# Patient Record
Sex: Female | Born: 1960
Health system: Southern US, Community
[De-identification: ages and names within clinical notes are randomized; demographics above are authoritative.]

## PROBLEM LIST (undated history)

## (undated) DIAGNOSIS — Z923 Personal history of irradiation: Secondary | ICD-10-CM

## (undated) DIAGNOSIS — C50412 Malignant neoplasm of upper-outer quadrant of left female breast: Secondary | ICD-10-CM

## (undated) DIAGNOSIS — Z800 Family history of malignant neoplasm of digestive organs: Secondary | ICD-10-CM

## (undated) DIAGNOSIS — Z9221 Personal history of antineoplastic chemotherapy: Secondary | ICD-10-CM

## (undated) DIAGNOSIS — K219 Gastro-esophageal reflux disease without esophagitis: Secondary | ICD-10-CM

## (undated) DIAGNOSIS — G360 Neuromyelitis optica [Devic]: Secondary | ICD-10-CM

## (undated) DIAGNOSIS — Z803 Family history of malignant neoplasm of breast: Secondary | ICD-10-CM

## (undated) DIAGNOSIS — F419 Anxiety disorder, unspecified: Secondary | ICD-10-CM

## (undated) DIAGNOSIS — C50919 Malignant neoplasm of unspecified site of unspecified female breast: Secondary | ICD-10-CM

## (undated) HISTORY — DX: Neuromyelitis optica (devic): G36.0

## (undated) HISTORY — DX: Anxiety disorder, unspecified: F41.9

## (undated) HISTORY — PX: BREAST LUMPECTOMY: SHX2

## (undated) HISTORY — PX: EYE SURGERY: SHX253

## (undated) HISTORY — DX: Malignant neoplasm of upper-outer quadrant of left female breast: C50.412

## (undated) HISTORY — DX: Family history of malignant neoplasm of digestive organs: Z80.0

## (undated) HISTORY — DX: Family history of malignant neoplasm of breast: Z80.3

## (undated) HISTORY — PX: ABDOMINAL HYSTERECTOMY: SHX81

## (undated) HISTORY — DX: Gastro-esophageal reflux disease without esophagitis: K21.9

## (undated) HISTORY — PX: REDUCTION MAMMAPLASTY: SUR839

---

## 1999-06-07 ENCOUNTER — Other Ambulatory Visit: Admission: RE | Admit: 1999-06-07 | Discharge: 1999-06-07 | Payer: Self-pay | Admitting: Obstetrics & Gynecology

## 2000-09-10 ENCOUNTER — Other Ambulatory Visit: Admission: RE | Admit: 2000-09-10 | Discharge: 2000-09-10 | Payer: Self-pay | Admitting: Obstetrics & Gynecology

## 2001-10-15 ENCOUNTER — Other Ambulatory Visit: Admission: RE | Admit: 2001-10-15 | Discharge: 2001-10-15 | Payer: Self-pay | Admitting: Obstetrics & Gynecology

## 2004-08-07 ENCOUNTER — Other Ambulatory Visit: Admission: RE | Admit: 2004-08-07 | Discharge: 2004-08-07 | Payer: Self-pay | Admitting: Obstetrics & Gynecology

## 2007-04-08 ENCOUNTER — Ambulatory Visit (HOSPITAL_COMMUNITY): Admission: RE | Admit: 2007-04-08 | Discharge: 2007-04-09 | Payer: Self-pay | Admitting: Obstetrics & Gynecology

## 2007-04-08 ENCOUNTER — Encounter (INDEPENDENT_AMBULATORY_CARE_PROVIDER_SITE_OTHER): Payer: Self-pay | Admitting: Obstetrics & Gynecology

## 2010-05-27 ENCOUNTER — Other Ambulatory Visit: Payer: Self-pay | Admitting: Obstetrics & Gynecology

## 2010-05-27 DIAGNOSIS — R928 Other abnormal and inconclusive findings on diagnostic imaging of breast: Secondary | ICD-10-CM

## 2010-05-31 ENCOUNTER — Ambulatory Visit
Admission: RE | Admit: 2010-05-31 | Discharge: 2010-05-31 | Disposition: A | Discharge status: Home / Self Care | Payer: Self-pay | Source: Ambulatory Visit | Attending: Obstetrics & Gynecology | Admitting: Obstetrics & Gynecology

## 2010-05-31 DIAGNOSIS — R928 Other abnormal and inconclusive findings on diagnostic imaging of breast: Secondary | ICD-10-CM

## 2010-07-16 NOTE — Op Note (Signed)
NAMEKYMANI, LAURSEN               ACCOUNT NO.:  [id number]   MEDICAL RECORD NO.:  [id number]          PATIENT TYPE:  OIB   LOCATION:  9302                          FACILITY:  [facility]   PHYSICIAN:  Freddy Finner, M.D.   DATE OF BIRTH:  1960/10/27   DATE OF PROCEDURE:  04/08/2007  DATE OF DISCHARGE:                               OPERATIVE REPORT   PREOPERATIVE DIAGNOSIS:  Fibroids, menorrhagia, severe dysmenorrhea.   POSTOPERATIVE DIAGNOSIS:  Fibroids, menorrhagia, severe dysmenorrhea.   OPERATIVE PROCEDURE:  Laparoscopically assisted vaginal hysterectomy.   SURGEON:  Freddy Finner, M.D.   ASSISTANT:  Guy Sandifer. Henderson Cloud, M.D.   ESTIMATED BLOOD LOSS:  300 to 350 mL.   COMPLICATIONS:  None.   INDICATIONS FOR PROCEDURE:  The patient is a 50 year old whose present  illness is detailed in the admission note, but briefly, she had  significant enlargement of her uterus with an approximately 3 cm  intracavitary submucous myoma as well as other fibromas of the uterus.  She had clinical symptoms of menorrhagia and severe dysmenorrhea, and  she is admitted now for laparoscopically assisted vaginal hysterectomy.  She has expressed the desire to keep her ovaries and for that reason,  they will be removed only if abnormal.   OPERATIVE FINDINGS:  Intraoperative findings were recorded and still  photographs obtained in the office records.  These included marked  enlargement of the uterus which essentially filled the patient with  submucous and subserosal and intramural myomas discovered during the  procedure.  There was a follicular type cyst on the right ovary.  The  left ovary was normal.  Both fallopian tubes were normal.  There was no  evidence of peritoneal disease.  The appendix was normal.  No apparent  abnormalities were noted in the upper abdomen.   DESCRIPTION OF PROCEDURE:  The patient was admitted on the morning of  surgery.  She was given a bolus of Ancef.  She was placed in PIS  serial  compression hose.  She was brought to the operating room and, there,  placed under adequate general anesthesia, and placed in the dorsal  lithotomy position.  A Betadine prep of the abdomen, perineum, and  vagina was carried out in the usual fashion.  The bladder was evacuated  with a sterile Foley catheter.  The cervix was visualized and a Hulka  tenaculum attached using a bivalve speculum.  Sterile drapes were  applied.  Two small incisions were made in the abdomen, one at the  umbilicus and one just above the symphysis.  An 11 mm bladed disposable  trocar was introduced in the umbilicus while elevating the anterior  abdominal wall manually.  Direct inspection revealed adequate placement  with no evidence of injury on entry.  Pneumoperitoneum was allowed to  accumulate with carbon dioxide gas.  A smaller second incision was made  just above the symphysis in the midline and through it a 5 mm bladed  trocar introduced under direct visualization.  Through it, a blunt probe  was used during the operative procedure.  Systematic examination of the  abdominal and  pelvic contents was carried out, photographs were made.  The patient was noted to have a prominent large sigmoid colon.  This was  not, in any other way, abnormal in appearance and there were no  pericecal adhesions.  The appendix was retrocecal but was visualized and  it appeared to be normal.   Using the Gyrus tripolar device through the operating channel of the  laparoscope and a blunt probe for traction as well as the Hulka  tenaculum for adequate exposure, progressive pedicles were developed  including the utero-ovarian pedicle, the fallopian tube, the round  ligament, and the upper broad ligament.  The dissection was carried down  to a level just above the uterine arteries.  Attention was ten turned  vaginally.  A posterior weighted vaginal retractor was placed.  The  Hulka tenaculum was removed.  Deaver retractors were  used laterally and  anteriorly for exposure.  The cervix was grasped with a Christella Hartigan  tenaculum.  A colpotomy incision was made while tenting the mucosa  posterior to the cervix.  The banana lighted retractor was then placed.  The cervix was released from the bladder anteriorly with a scalpel.  The  LigaSure device was then used to seal and divided the uterosacral  pedicles and the bladder pelvis.  The bladder was advanced off the  cervix.  Cardinal ligament pedicles were taken, sealed, and divided with  the LigaSure device.  The anterior peritoneum was entered.  The LigaSure  device was then used to seal and divide the uterine artery pedicles.  This allowed delivery of the uterus through the vaginal introitus after  a Kohring procedure to reduce uterine volume.  This allowed  identification of the submucous fibroid.  The angles of the vagina were  then anchored to the uterosacral ligaments on each side with a mattress  suture of 0 Monocryl.  Some bleeding was encountered along the right  side at the level of the uterine artery and cardinal ligament.  This was  sealed successfully with the LigaSure device.  This produced adequate  hemostasis.  The uterosacrals were then plicated with a 0 Monocryl  suture and posterior peritoneum in the cul-de-sac closed.  The cuff was  closed with figure-of-eight 0 Monocryl in a vertical fashion.  A Foley  catheter was placed.  Clear urine was obtained.  Reinspection was then  carried out laparoscopically.  The Nezhat irrigation system was used and  complete hemostasis was confirmed under careful irrigation and reduced  intra-abdominal pressure.  Irrigating solution was aspirated from the  abdomen, the gas was allowed to escape, the instruments were removed.  The skin incisions were closed with interrupted subcuticular sutures of  3-0 Dexon.  0.25% plain Marcaine was injected into the incision sites  for postoperative analgesia.  Steri-Strips were applied to  the lower  incision and a sterile dressing to the umbilicus.  The patient was  awakened and taken to the recovery room in good condition.      Freddy Finner, M.D.  Electronically Signed     WRN/MEDQ  D:  04/08/2007  T:  04/08/2007  Job:  [id number]

## 2010-07-16 NOTE — Discharge Summary (Signed)
Grace Moyer, Grace Moyer               ACCOUNT NO.:  [id number]   MEDICAL RECORD NO.:  [id number]          PATIENT TYPE:  OIB   LOCATION:  9302                          FACILITY:  [facility]   PHYSICIAN:  Freddy Finner, M.D.   DATE OF BIRTH:  30-Jan-1961   DATE OF ADMISSION:  04/08/2007  DATE OF DISCHARGE:  04/09/2007                               DISCHARGE SUMMARY   DISCHARGE DIAGNOSIS:  Multiple uterine leiomyomata with clinical  symptoms of menorrhagia, severe dysmenorrhea, ultrasound documented a 3-  cm submucous myoma in addition to numerous others with the largest being  5-cm.   OPERATIVE PROCEDURE:  Laparoscopically-assisted vaginal hysterectomy.   SECONDARY DIAGNOSIS:  Follicular appearing right ovarian cyst, benign in  appearance and untreated.   INTRAOPERATIVE/POSTOPERATIVE COMPLICATIONS:  None.   DISPOSITION:  The patient is in satisfactory and improved condition at  the time of her discharge.  She is having adequate bowel and bladder  function.  She is tolerating a regular diet.  She was discharged home  for followup in the office in 2 weeks.  She is to call for symptoms of  overflow urination with bladder retention.  She is to call for fever,  severe pain, or heavy vaginal bleeding.  She is given Percocet 5/325 to  be taken 1-2 every four hours as needed for postoperative pain.  She is  to take a vitamin with iron.  She is progressively increase her physical  activity but no vaginal entry or heavy lifting.   Details of present illness, past history, family history, review of  systems, and physical exam recorded in the admission note.   PHYSICAL FINDINGS:  Of note was the marked enlargement of the uterus.   LABORATORY DATA:  During this admission included a normal urinalysis, a  normal prothrombin time and PTT.  CBC showed a hemoglobin of 10.6 on  admission.  Other parameters were essentially normal.  Postoperative  hemoglobin was 8.7 consistent with estimated blood loss of  300 mL  intraoperatively.   HOSPITAL COURSE:  The patient was admitted on the morning of surgery.  She was treated perioperatively with IV antibiotic with compression hose  for her lower extremities.  The above surgical  procedure was  accomplished without significant difficulty or intraoperative  complications.  By the evening of the first postoperative day she was  voiding 600 mL with a residual of 118 mL.  She had bladder spasm with a  catheter which was removed immediately after surgery.  It was felt that  her voiding was adequate and she was discharged home with disposition as  noted above.  She remained afebrile throughout the hospital stay.     Freddy Finner, M.D.  Electronically Signed    WRN/MEDQ  D:  04/09/2007  T:  04/11/2007  Job:  [id number]

## 2010-07-16 NOTE — H&P (Signed)
Grace Moyer, Grace Moyer               ACCOUNT NO.:  [id number]   MEDICAL RECORD NO.:  [id number]          PATIENT TYPE:  AMB   LOCATION:  SDC                           FACILITY:  [facility]   PHYSICIAN:  Freddy Finner, M.D.   DATE OF BIRTH:  [birth year]   DATE OF ADMISSION:  04/08/2007  DATE OF DISCHARGE:                              HISTORY & PHYSICAL   ADMISSION DIAGNOSIS:  Uterine leiomyomata, multiple, with large 3 cm x  2.6 cm intracavitary submucous myoma.   CLINICAL HISTORY:  Severe menorrhagia, dysmenorrhea.   Patient is a 50 year old married white female, gravida 1, para 1, who  has been followed in my office since 1989.  In the recent past, she has  complained of progressively increasing menorrhagia, severe dysmenorrhea.  Her examination is a bit compromised by body habitus, but it was felt  the uterus was probably enlarged.  She had a sonohystogram in the office  on February 18, 2007 for evaluation of her symptoms and was found to  have multiple leiomyomata, the largest measuring 5.7 cm.  She also had  one lesion outlined by the saline infusion, which was 3 x 2.6 cm in the  uterine cavity itself.  Options of therapy were discussed with her,  including possible myomectomy, possible endometrial ablation, and  submucous myomectomy.  She requested definitive surgery since she is  very certain she does not want to have further pregnancies at her age of  40.  She has expressed a wish to keep her ovaries if at all possible,  and they will be removed only if abnormality is identified.  She is now  admitted for laparoscopic-assisted vaginal hysterectomy.  The potential  risks of the surgery have been reviewed with the patient, including  booklet and/or video, describing the potential risks, including injury  to other organs, postoperative infection, intraoperative hemorrhage.  She is prepared to proceed with surgery.   Her current review of systems is otherwise negative.  There are no  cardiopulmonary, GI or GU complaints.   Patient has no known significant ongoing medical illnesses.  She is not  on any medications on a chronic basis.   She smokes approximately one pack of cigarettes per week.  She has never  had a blood transfusion.   She has no known allergies to medications.   She does not use alcohol.   FAMILY HISTORY:  Noncontributory.   PHYSICAL EXAMINATION:  HEENT:  Grossly within normal limits.  Thyroid gland is not palpably enlarged.  HEART:  Normal sinus rhythm without murmurs, rubs or gallops.  CHEST:  Clear to auscultation.  BREASTS:  Normal.  No palpable masses.  No skin changes or nipple  discharge.  ABDOMEN:  Soft and nontender without appreciable organomegaly or  palpable masses.  EXTREMITIES:  Without clubbing, cyanosis or edema.  PELVIC:  Findings are remarkable, as noted above.   ASSESSMENT:  Multiple uterine leiomyomata, including large intracavitary  submucous myoma.   PLAN:  Laparoscopic-assisted vaginal hysterectomy, perioperative  antibiotics, and antiembolic hose.      Freddy Finner, M.D.  Electronically Signed  WRN/MEDQ  D:  04/07/2007  T:  04/07/2007  Job:  [id number]

## 2010-11-22 LAB — URINALYSIS, DIPSTICK ONLY
Bilirubin Urine: NEGATIVE
Glucose, UA: NEGATIVE
Hgb urine dipstick: NEGATIVE
Ketones, ur: NEGATIVE
Leukocytes, UA: NEGATIVE
Nitrite: NEGATIVE
Protein, ur: NEGATIVE
Specific Gravity, Urine: >1.03 — ABNORMAL HIGH
Urobilinogen, UA: 0.2
pH: 5.5

## 2010-11-22 LAB — CBC
HCT: 26.1 — ABNORMAL LOW
HCT: 31.3 — ABNORMAL LOW
Hemoglobin: 10.6 — ABNORMAL LOW
Hemoglobin: 8.7 — ABNORMAL LOW
MCHC: 33.3
MCHC: 33.8
MCV: 73.5 — ABNORMAL LOW
MCV: 73.7 — ABNORMAL LOW
Platelets: 238
Platelets: 298
RBC: 3.55 — ABNORMAL LOW
RBC: 4.26
RDW: 14.9
RDW: 15.1
WBC: 6.8
WBC: 8.1

## 2010-11-22 LAB — APTT: aPTT: 31

## 2010-11-22 LAB — PROTIME-INR
INR: 1
Prothrombin Time: 13.1

## 2014-08-14 ENCOUNTER — Other Ambulatory Visit: Payer: Self-pay | Admitting: Obstetrics & Gynecology

## 2014-08-15 LAB — CYTOLOGY - PAP

## 2015-03-04 HISTORY — PX: BREAST BIOPSY: SHX20

## 2015-08-02 ENCOUNTER — Ambulatory Visit (HOSPITAL_COMMUNITY)
Admission: RE | Admit: 2015-08-02 | Discharge: 2015-08-02 | Disposition: A | Discharge status: Home / Self Care | Payer: 59 | Source: Ambulatory Visit | Attending: Physician Assistant | Admitting: Physician Assistant

## 2015-08-02 ENCOUNTER — Other Ambulatory Visit (HOSPITAL_COMMUNITY): Payer: Self-pay | Admitting: Physician Assistant

## 2015-08-02 DIAGNOSIS — R200 Anesthesia of skin: Secondary | ICD-10-CM

## 2015-08-14 ENCOUNTER — Inpatient Hospital Stay (HOSPITAL_COMMUNITY)
Admission: EM | Admit: 2015-08-14 | Discharge: 2015-08-17 | DRG: 092 | Disposition: A | Discharge status: Home / Self Care | Payer: 59 | Attending: Internal Medicine | Admitting: Internal Medicine

## 2015-08-14 ENCOUNTER — Encounter (HOSPITAL_COMMUNITY): Payer: Self-pay | Admitting: Emergency Medicine

## 2015-08-14 ENCOUNTER — Emergency Department (HOSPITAL_COMMUNITY): Payer: 59

## 2015-08-14 DIAGNOSIS — N390 Urinary tract infection, site not specified: Secondary | ICD-10-CM | POA: Diagnosis present

## 2015-08-14 DIAGNOSIS — M542 Cervicalgia: Secondary | ICD-10-CM | POA: Diagnosis present

## 2015-08-14 DIAGNOSIS — R930 Abnormal findings on diagnostic imaging of skull and head, not elsewhere classified: Secondary | ICD-10-CM | POA: Diagnosis present

## 2015-08-14 DIAGNOSIS — R200 Anesthesia of skin: Secondary | ICD-10-CM

## 2015-08-14 DIAGNOSIS — E663 Overweight: Secondary | ICD-10-CM | POA: Diagnosis present

## 2015-08-14 DIAGNOSIS — R208 Other disturbances of skin sensation: Principal | ICD-10-CM | POA: Diagnosis present

## 2015-08-14 DIAGNOSIS — Z87891 Personal history of nicotine dependence: Secondary | ICD-10-CM

## 2015-08-14 DIAGNOSIS — D860 Sarcoidosis of lung: Secondary | ICD-10-CM

## 2015-08-14 LAB — TSH: TSH: 2.396 u[IU]/mL (ref 0.350–4.500)

## 2015-08-14 LAB — URINE MICROSCOPIC-ADD ON: RBC / HPF: NONE SEEN RBC/hpf (ref 0–5)

## 2015-08-14 LAB — CBC
HCT: 43.9 % (ref 36.0–46.0)
Hemoglobin: 15.3 g/dL — ABNORMAL HIGH (ref 12.0–15.0)
MCH: 29.9 pg (ref 26.0–34.0)
MCHC: 34.9 g/dL (ref 30.0–36.0)
MCV: 85.7 fL (ref 78.0–100.0)
Platelets: 211 10*3/uL (ref 150–400)
RBC: 5.12 MIL/uL — ABNORMAL HIGH (ref 3.87–5.11)
RDW: 12.8 % (ref 11.5–15.5)
WBC: 7.9 10*3/uL (ref 4.0–10.5)

## 2015-08-14 LAB — COMPREHENSIVE METABOLIC PANEL
ALT: 13 U/L — ABNORMAL LOW (ref 14–54)
AST: 18 U/L (ref 15–41)
Albumin: 4.3 g/dL (ref 3.5–5.0)
Alkaline Phosphatase: 53 U/L (ref 38–126)
Anion gap: 11 (ref 5–15)
BUN: 13 mg/dL (ref 6–20)
CO2: 20 mmol/L — ABNORMAL LOW (ref 22–32)
Calcium: 8.9 mg/dL (ref 8.9–10.3)
Chloride: 106 mmol/L (ref 101–111)
Creatinine, Ser: 0.79 mg/dL (ref 0.44–1.00)
GFR calc Af Amer: >60 mL/min (ref >60)
GFR calc non Af Amer: >60 mL/min (ref >60)
Glucose, Bld: 96 mg/dL (ref 65–99)
Potassium: 3.5 mmol/L (ref 3.5–5.1)
Sodium: 137 mmol/L (ref 135–145)
Total Bilirubin: 1 mg/dL (ref 0.3–1.2)
Total Protein: 7.2 g/dL (ref 6.5–8.1)

## 2015-08-14 LAB — URINALYSIS, ROUTINE W REFLEX MICROSCOPIC
Glucose, UA: NEGATIVE mg/dL
Hgb urine dipstick: NEGATIVE
Ketones, ur: >80 mg/dL — AB
Leukocytes, UA: NEGATIVE
Nitrite: NEGATIVE
Specific Gravity, Urine: 1.015 (ref 1.005–1.030)
pH: 6.5 (ref 5.0–8.0)

## 2015-08-14 LAB — LIPASE, BLOOD: Lipase: 14 U/L (ref 11–51)

## 2015-08-14 LAB — GLUCOSE, CAPILLARY: Glucose-Capillary: 107 mg/dL — ABNORMAL HIGH (ref 65–99)

## 2015-08-14 LAB — CSF CELL COUNT WITH DIFFERENTIAL
Eosinophils, CSF: 0 % (ref 0–1)
Lymphs, CSF: 97 % — ABNORMAL HIGH (ref 40–80)
Monocyte-Macrophage-Spinal Fluid: 3 % — ABNORMAL LOW (ref 15–45)
Other Cells, CSF: 0
RBC Count, CSF: 18 /mm3 — ABNORMAL HIGH
Segmented Neutrophils-CSF: 0 % (ref 0–6)
Tube #: 4
WBC, CSF: 38 /mm3 (ref 0–5)

## 2015-08-14 LAB — GLUCOSE, CSF: Glucose, CSF: 46 mg/dL (ref 40–70)

## 2015-08-14 LAB — PROTEIN, CSF: Total  Protein, CSF: 38 mg/dL (ref 15–45)

## 2015-08-14 LAB — SEDIMENTATION RATE: Sed Rate: 8 mm/hr (ref 0–22)

## 2015-08-14 MED ORDER — LIDOCAINE HCL (PF) 1 % IJ SOLN
5.0000 mL | Freq: Once | INTRAMUSCULAR | Status: AC
  Administered 2015-08-14: 5 mL via INTRADERMAL
  Filled 2015-08-14: qty 5

## 2015-08-14 MED ORDER — ONDANSETRON HCL 4 MG PO TABS: 4.0000 mg | ORAL_TABLET | Freq: Four times a day (QID) | ORAL | Status: DC | PRN

## 2015-08-14 MED ORDER — SODIUM CHLORIDE 0.9 % IV SOLN
INTRAVENOUS | Status: DC
  Administered 2015-08-14 – 2015-08-17 (×2): via INTRAVENOUS

## 2015-08-14 MED ORDER — ONDANSETRON HCL 4 MG/2ML IJ SOLN: 4.0000 mg | Freq: Four times a day (QID) | INTRAMUSCULAR | Status: DC | PRN

## 2015-08-14 MED ORDER — SODIUM CHLORIDE 0.9 % IV SOLN
250.0000 mg | Freq: Four times a day (QID) | INTRAVENOUS | Status: AC
  Administered 2015-08-14 – 2015-08-17 (×12): 250 mg via INTRAVENOUS
  Filled 2015-08-14 (×12): qty 2

## 2015-08-14 MED ORDER — ONDANSETRON HCL 4 MG/2ML IJ SOLN
4.0000 mg | Freq: Once | INTRAMUSCULAR | Status: AC
  Administered 2015-08-14: 4 mg via INTRAVENOUS
  Filled 2015-08-14: qty 2

## 2015-08-14 MED ORDER — GADOBENATE DIMEGLUMINE 529 MG/ML IV SOLN
20.0000 mL | Freq: Once | INTRAVENOUS | Status: AC | PRN
  Administered 2015-08-14: 20 mL via INTRAVENOUS

## 2015-08-14 MED ORDER — SODIUM CHLORIDE 0.9 % IV BOLUS (SEPSIS)
500.0000 mL | Freq: Once | INTRAVENOUS | Status: AC
  Administered 2015-08-14: 500 mL via INTRAVENOUS

## 2015-08-14 MED ORDER — PANTOPRAZOLE SODIUM 40 MG PO TBEC
40.0000 mg | DELAYED_RELEASE_TABLET | Freq: Every day | ORAL | Status: DC
  Administered 2015-08-14 – 2015-08-17 (×4): 40 mg via ORAL
  Filled 2015-08-14 (×4): qty 1

## 2015-08-14 NOTE — Consult Note (Signed)
[place] A. Merlene Laughter, MD     [email or web address]          Grace Moyer is an 55 y.o. female.   ASSESSMENT/PLAN: 1. Acute myelopathy with multiple enhancing lesions involving the central nervous system including the cervical spine and the right hemisphere. Differential diagnosis is extensive. The patient's age of presentation and the MRI findings seems not to point listed demyelination either multiple sclerosis or neuromyelitis optica/Devic's disease. The findings in my view are more concerning for central nervous system malignancy even a lymphoma or glioma. Certainly inflammatory processes such as sarcoidosis and vasculitis are also concerning.   2. UTI.      RECOMMENDATION: I agree with the current workup which has been undertaken including cytology of the spinal fluid.  The patient has been started on high-dose steroids which seems reasonable for symptomatic relief.  We should consider SPECT scan or PET scan to evaluate the hyperactivity of the lesions seen on MRI. This would help differentiate these lesions are inflammatory or malignant.  Additional labs will be done for the following: HIV, RPR, SSB/SSA for Sjogren's syndrome, ANA and vitamin B12 level. We will also follow-up the oligoclonal bands to evaluate for demyelination on spinal fluid.   Chest XRAY.      The patient is a 55 year old white female who decided see medical attention because of having significant nausea and vomiting over the last 3 days. However, the patient reports that over the past 3 weeks she's had numbness and tingling involving the lower extremities. Initially symptoms started in the feet but progressed more proximally to involve the sacral region. She also reports more recently developing lancinating numbness and pain when she bends her neck forward consistent with Lhermitte sign. The patient has not had neurological symptoms like this previously. There is no family history of  neurological symptoms. She reports having low-grade fever on Memorial Day. She denies chest pain, shortness of breath or visual symptoms. She does have some chronic blurry vision without her glasses but nothing outside of the usual for the patient. Urinary symptoms are not reported. The review systems otherwise negative.       GENERAL: This a very pleasant overweight female in no acute distress.  HEENT: Normal.  ABDOMEN: soft  EXTREMITIES: No edema   BACK: Normal.  SKIN: Normal by inspection.    MENTAL STATUS: Alert and oriented. Speech, language and cognition are generally intact. Judgment and insight normal.   CRANIAL NERVES: Pupils are equal, round and reactive to light and accomodation; extra ocular movements are full, there is no significant nystagmus; visual fields are full; upper and lower facial muscles are normal in strength and symmetric, there is no flattening of the nasolabial folds; tongue is midline; uvula is midline; shoulder elevation is normal.  MOTOR: Normal tone, bulk and strength; no pronator drift.  COORDINATION: Left finger to nose is normal, right finger to nose is normal, No rest tremor; no intention tremor; no postural tremor; no bradykinesia.  REFLEXES: Deep tendon reflexes are symmetrical and normal. Babinski reflexes are flexor bilaterally.   SENSATION: Normal to light touch and temperature.       The patient's cervical spine MRI and brain MRI are reviewed in person. There is a large signal seen with some expansion of the spinal cord at C2-C3 involving mostly the posterior aspect of the cord associated with ring enhancement with gadolinium. The abnormality does not extend to other segments and essentially involves this one segment. The brain MRI shows 2 small  enhancing lesions involving the right frontal lobe mostly the subcortical region. One involves the subcortical area next to the caudate nuclei and the frontal horn of the lateral ventricle. There  are 2 larger enhancing lesions involving the right medial temporal lobe and right temporal lobes. There is also increased signal/enhancement seen around the trigone also on the right side. Interestingly, FLAIR imaging does not show typical findings suggestive of demyelination. There is a large lesion involving the right frontal area which did not enhance. There is also increased signal seen around the trigone on the right side. This region did enhance.           Blood pressure 105/91, pulse 62, temperature 98.2 F (36.8 C), temperature source Oral, resp. rate 16, height 5\' 3"  (1.6 m), weight 202 lb (91.627 kg), SpO2 100 %.  History reviewed. No pertinent past medical history.  Past Surgical History  Procedure Laterality Date  . Abdominal hysterectomy      partial    History reviewed. No pertinent family history.  Social History:  reports that she has quit smoking. Her smoking use included Cigarettes. She does not have any smokeless tobacco history on file. She reports that she does not drink alcohol or use illicit drugs.  Allergies: No Known Allergies  Medications: Prior to Admission medications   Medication Sig Start Date End Date Taking? Authorizing Provider  naproxen sodium (ALEVE) 220 MG tablet Take 220 mg by mouth 2 (two) times daily as needed (pain/inflammation).   Yes Historical Provider, MD  omeprazole (PRILOSEC) 40 MG capsule Take 1 capsule by mouth daily. 08/13/15  Yes Historical Provider, MD    Scheduled Meds: . methylPREDNISolone (SOLU-MEDROL) injection  250 mg Intravenous Q6H  . pantoprazole  40 mg Oral Daily   Continuous Infusions: . sodium chloride 100 mL/hr at 08/14/15 1740   PRN Meds:.ondansetron **OR** ondansetron (ZOFRAN) IV     Results for orders placed or performed during the hospital encounter of 08/14/15 (from the past 48 hour(s))  Lipase, blood     Status: None   Collection Time: 08/14/15  8:00 AM  Result Value Ref Range   Lipase 14 11 - 51  U/L  Comprehensive metabolic panel     Status: Abnormal   Collection Time: 08/14/15  8:00 AM  Result Value Ref Range   Sodium 137 135 - 145 mmol/L   Potassium 3.5 3.5 - 5.1 mmol/L   Chloride 106 101 - 111 mmol/L   CO2 20 (L) 22 - 32 mmol/L   Glucose, Bld 96 65 - 99 mg/dL   BUN 13 6 - 20 mg/dL   Creatinine, Ser 08/16/15 0.44 - 1.00 mg/dL   Calcium 8.9 8.9 - 7.75 mg/dL   Total Protein 7.2 6.5 - 8.1 g/dL   Albumin 4.3 3.5 - 5.0 g/dL   AST 18 15 - 41 U/L   ALT 13 (L) 14 - 54 U/L   Alkaline Phosphatase 53 38 - 126 U/L   Total Bilirubin 1.0 0.3 - 1.2 mg/dL   GFR calc non Af Amer >60 >60 mL/min   GFR calc Af Amer >60 >60 mL/min    Comment: (NOTE) The eGFR has been calculated using the CKD EPI equation. This calculation has not been validated in all clinical situations. eGFR's persistently <60 mL/min signify possible Chronic Kidney Disease.    Anion gap 11 5 - 15  CBC     Status: Abnormal   Collection Time: 08/14/15  8:00 AM  Result Value Ref Range   WBC  7.9 4.0 - 10.5 K/uL   RBC 5.12 (H) 3.87 - 5.11 MIL/uL   Hemoglobin 15.3 (H) 12.0 - 15.0 g/dL   HCT 43.9 36.0 - 46.0 %   MCV 85.7 78.0 - 100.0 fL   MCH 29.9 26.0 - 34.0 pg   MCHC 34.9 30.0 - 36.0 g/dL   RDW 12.8 11.5 - 15.5 %   Platelets 211 150 - 400 K/uL  TSH     Status: None   Collection Time: 08/14/15  8:00 AM  Result Value Ref Range   TSH 2.396 0.350 - 4.500 uIU/mL  Urinalysis, Routine w reflex microscopic     Status: Abnormal   Collection Time: 08/14/15  8:41 AM  Result Value Ref Range   Color, Urine YELLOW YELLOW   APPearance CLEAR CLEAR   Specific Gravity, Urine 1.015 1.005 - 1.030   pH 6.5 5.0 - 8.0   Glucose, UA NEGATIVE NEGATIVE mg/dL   Hgb urine dipstick NEGATIVE NEGATIVE   Bilirubin Urine SMALL (A) NEGATIVE   Ketones, ur >80 (A) NEGATIVE mg/dL   Protein, ur TRACE (A) NEGATIVE mg/dL   Nitrite NEGATIVE NEGATIVE   Leukocytes, UA NEGATIVE NEGATIVE  Urine microscopic-add on     Status: Abnormal   Collection Time:  08/14/15  8:41 AM  Result Value Ref Range   Squamous Epithelial / LPF 6-30 (A) NONE SEEN   WBC, UA 0-5 0 - 5 WBC/hpf   RBC / HPF NONE SEEN 0 - 5 RBC/hpf   Bacteria, UA MANY (A) NONE SEEN   Casts HYALINE CASTS (A) NEGATIVE  CSF cell count with differential collection tube #: 4     Status: Abnormal   Collection Time: 08/14/15  2:30 PM  Result Value Ref Range   Tube # 4    Color, CSF COLORLESS COLORLESS   Appearance, CSF CLEAR CLEAR   Supernatant NOT INDICATED    RBC Count, CSF 18 (H) 0 /cu mm   WBC, CSF 38 (HH) 0 - 5 /cu mm    Comment: CRITICAL RESULT CALLED TO, READ BACK BY AND VERIFIED WITH: GARZON,S. AT 1700 ON 08/14/2015 BY BAUGHAM,M.    Segmented Neutrophils-CSF 0 0 - 6 %   Lymphs, CSF 97 (H) 40 - 80 %   Monocyte-Macrophage-Spinal Fluid 3 (L) 15 - 45 %   Eosinophils, CSF 0 0 - 1 %   Other Cells, CSF 0     Comment: PENDING PATHOLOGIST REVIEW  Glucose, CSF     Status: None   Collection Time: 08/14/15  2:30 PM  Result Value Ref Range   Glucose, CSF 46 40 - 70 mg/dL  Protein, CSF     Status: None   Collection Time: 08/14/15  2:30 PM  Result Value Ref Range   Total  Protein, CSF 38 15 - 45 mg/dL    Studies/Results:    C SPINE BRAIN MRI FINDINGS: MRI HEAD FINDINGS  There is abnormal right periatrial and temporal horn T2 and FLAIR hyperintensity which has associated T2 shine through on diffusion (series 100, image 22 and series 101, image 19), but does not appear definitely restricted on ADC. No restricted diffusion elsewhere. Major intracranial vascular flow voids are preserved.  Cerebral volume appears normal. There are otherwise scattered small nonspecific white matter T2 and FLAIR hyperintense foci in the frontal lobes, more so the right (series 106, image 19). No corpus callosum involvement. No cortical encephalomalacia or cortical edema identified. No chronic cerebral blood products. Deep gray matter nuclei, brainstem, and cerebellum are within  normal limits  for age.  However, following contrast there is multifocal abnormal enhancement about the right lateral ventricle, including at the right frontal horn (series 13, image 56) along the atrium at the area of diffusion abnormality, and continuing along the right temporal horn even to the right para hippocampal gyrus (series 13, images 45 and 39). Furthermore there appears to be a punctate area of abnormal enhancement in the anterior right temporal tip on series 13, image 41, but this is not confirmed in the other imaging planes. A round 4 5 mm focus of abnormal enhancement anterior and inferior to the right frontal horn (series 13, image 48) partially affects the right caudate nucleus but is not associated with abnormal T2, FLAIR signal, or definite changes on diffusion.  There is questionable left temporal horn periventricular enhancement also on the left (series 13, image 45). There is no ependymoma enhancement. There is no debris identified within the ventricles or ventriculomegaly. No associated dural thickening or leptomeningeal enhancement.  No midline shift, mass effect, extra-axial collection or acute intracranial hemorrhage. Negative pituitary. No definite cervicomedullary junction abnormality, although there is questionable faint abnormal enhancement along the ventral fourth ventricle on series 15, image 10.  Visible internal auditory structures appear normal. Mastoids are clear. Minimal paranasal sinus mucosal thickening. Orbits soft tissues appear normal. Negative scalp soft tissues. Visualized bone marrow signal is within normal limits.  MRI CERVICAL SPINE FINDINGS  No definite abnormal enhancement in the lower brainstem or at the cervicomedullary junction on these images. However, there is a 1.9 cm length cervical spinal cord lesion centered at C2-C3 with patchy abnormal enhancement. The dorsal columns appear most affected on series 9, image 3. See also series 8,  image 7. There is associated mild T2 and FLAIR hyperintensity corresponding to the area of enhancement and perhaps slight cord expansion. There is otherwise no spinal cord edema. The remaining cervical spinal cord signal and morphology is normal. The visualized upper thoracic spinal cord appears normal. No other abnormal intradural enhancement. No associated dural thickening.  Preserved cervical lordosis. No marrow edema or evidence of acute osseous abnormality.  C5-C6 left eccentric cervical disc bulge and protrusion with mild spinal stenosis. No cord mass effect. Mild left C6 foraminal stenosis. Left side upper cervical spine moderate facet hypertrophy. No other cervical spinal stenosis.  Negative paraspinal soft tissues including the major vascular flow voids and lung apices.  IMPRESSION: 1. Multiple predominantly sub-centimeter cerebral and upper cervical spinal cord lesions which are variably associated with T2/FLAIR abnormality. Periventricular pattern of cerebral involvement is noted. No associated cerebral mass effect. There is mild cord expansion corresponding to the largest of the lesions which measures 1.9 cm at C2-C3. No associated meningeal or dural enhancement. 2. Infectious or inflammatory process is favored (including atypical CNS infections, Neurosarcoidosis, demyelinating disease from MS or ADEM), but Lymphoma can have a periventricular predilection. Astrocytoma or other CNS tumor is not favored, and metastatic disease seems very unlikely in light of little to no associated edema. CSF analysis may be most valuable at this point. 3. Mild cervical spine degeneration, with mild multifactorial degenerative spinal stenosis at C5-C6.              Kaien Pezzullo A. Merlene Laughter, M.D.  Diplomate, [employer] adviser of Psychiatry and Neurology ( Neurology). 08/14/2015, 9:36 PM

## 2015-08-14 NOTE — ED Notes (Signed)
Pt c/o lower body "tingling" x 3 weeks. States she saw PCP twice and was referred to a neurologist. Pt states over the past two days, symptoms have worsened. States she now has also begun vomiting with no appetite. Denies diarrhea.

## 2015-08-14 NOTE — ED Provider Notes (Signed)
CSN: [id number]     Arrival date & time 08/14/15  0736 History   First MD Initiated Contact with Patient 08/14/15 0747     Chief Complaint  Patient presents with  . Emesis     (Consider location/radiation/quality/duration/timing/severity/associated sxs/prior Treatment) HPI   This is a 55 yo woman with history of partial hysterectomy few years ago, who presents with 3 days of nausea and vomiting, and tingling in both legs for the las 3 weeks. Patient says that the nausea and vomiting started 3 days ago, and it is clear non-bloody emesis. She vomited twice yesterday and once this morning. She has not been able to keep any foods down. She also says she had some 'low grade fever' and chills but did not measure the temp. She also feels bloated and has been taking omeprazole in the last few days.   She says her tingling started in the bottom of the feet and now it is in both of her legs, more pins and needles sensation, and it is worsened when she bends her head down. However, she denies any numbness. She is able to feel the sensation of urinating and passing stool. She denies any accidents or trauma.   No history of travel, insect bites, new foods. She had followed up with ehr PCP, and she had been tested for Lyme disease and it was negative. She denies any rash.   Later on, the patient mentioned that she started having hiccups for few days, and her tingling is worse when she flexes her neck. The tingling started after she had a "fever" 3 weeks ago and some neck pain.      History reviewed. No pertinent past medical history. Past Surgical History  Procedure Laterality Date  . Abdominal hysterectomy      partial   No family history on file. Social History  Substance Use Topics  . Smoking status: Former Smoker    Types: Cigarettes  . Smokeless tobacco: None  . Alcohol Use: No   OB History    No data available     Review of Systems  Constitutional: Positive for fever and chills.  Negative for activity change.  Gastrointestinal: Positive for nausea, vomiting and abdominal distention. Negative for diarrhea, constipation and blood in stool.  Genitourinary: Negative for dysuria and difficulty urinating.  Musculoskeletal: Positive for neck pain. Negative for back pain.  Skin: Negative for rash.  Neurological: Negative for tremors, weakness and numbness.  All other systems reviewed and are negative.     Allergies  Review of patient's allergies indicates no known allergies.  Home Medications   Prior to Admission medications   Medication Sig Start Date End Date Taking? Authorizing Provider  naproxen sodium (ALEVE) 220 MG tablet Take 220 mg by mouth 2 (two) times daily as needed (pain/inflammation).   Yes Historical Provider, MD  omeprazole (PRILOSEC) 40 MG capsule Take 1 capsule by mouth daily. 08/13/15  Yes Historical Provider, MD   BP 126/65 mmHg  Pulse 55  Temp(Src) 98.2 F (36.8 C) (Oral)  Resp 20  Ht 5\' 3"  (1.6 m)  Wt 202 lb (91.627 kg)  BMI 35.79 kg/m2  SpO2 100% Physical Exam  Constitutional: She is oriented to person, place, and time. She appears well-developed and well-nourished.  HENT:  Head: Normocephalic and atraumatic.  Eyes: Conjunctivae are normal.  Neck: Normal range of motion. Neck supple.  Cardiovascular: Regular rhythm, normal heart sounds and intact distal pulses.  Exam reveals no friction rub.   No murmur  heard. Pulmonary/Chest: Effort normal and breath sounds normal. No respiratory distress. She has no wheezes.  Abdominal: Soft. Bowel sounds are normal. She exhibits no distension. There is no tenderness.  Musculoskeletal: She exhibits no edema.  Patient has no midline tenderness in the spine full range of motion head neck. No meningismus.  Neurological: She is alert and oriented to person, place, and time. She has normal strength. No sensory deficit. She exhibits normal muscle tone. GCS eye subscore is 4. GCS verbal subscore is 5. GCS  motor subscore is 6.  Reflex Scores:      Patellar reflexes are 2+ on the right side and 2+ on the left side.      Achilles reflexes are 1+ on the right side and 1+ on the left side. 5+ strength in UE and LE with f/e at major joints. Sensation to palpation intact in UE and LE. CNs 2-12 grossly intact.  EOMFI.  PERRL.   Finger nose and coordination intact bilateral.   Visual fields intact to finger testing. No nystagmus   Skin: Skin is warm and dry. No rash noted. No erythema.  Nursing note and vitals reviewed.   ED Course  .Lumbar Puncture Date/Time: 08/14/2015 3:48 PM Performed by: Elnora Morrison Authorized by: Elnora Morrison Consent: Verbal consent obtained. Written consent obtained. Consent given by: patient Patient understanding: patient states understanding of the procedure being performed Patient consent: the patient's understanding of the procedure matches consent given Procedure consent: procedure consent matches procedure scheduled Relevant documents: relevant documents present and verified Patient identity confirmed: verbally with patient and arm band Time out: Immediately prior to procedure a "time out" was called to verify the correct patient, procedure, equipment, support staff and site/side marked as required. Indications: evaluation for infection Anesthesia: local infiltration Local anesthetic: lidocaine 1% without epinephrine Anesthetic total: 5 ml Patient sedated: no Preparation: Patient was prepped and draped in the usual sterile fashion. Lumbar space: L4-L5 interspace Patient's position: sitting Needle gauge: 20 Needle type: diamond point Needle length: 3.5 in Fluid appearance: clear Tubes of fluid: 3 Total volume: 6 ml Post-procedure: site cleaned Patient tolerance: Patient tolerated the procedure well with no immediate complications   (including critical care time)  Labs Review Labs Reviewed  COMPREHENSIVE METABOLIC PANEL - Abnormal; Notable for  the following:    CO2 20 (*)    ALT 13 (*)    All other components within normal limits  CBC - Abnormal; Notable for the following:    RBC 5.12 (*)    Hemoglobin 15.3 (*)    All other components within normal limits  URINALYSIS, ROUTINE W REFLEX MICROSCOPIC (NOT AT [facility]) - Abnormal; Notable for the following:    Bilirubin Urine SMALL (*)    Ketones, ur >80 (*)    Protein, ur TRACE (*)    All other components within normal limits  URINE MICROSCOPIC-ADD ON - Abnormal; Notable for the following:    Squamous Epithelial / LPF 6-30 (*)    Bacteria, UA MANY (*)    Casts HYALINE CASTS (*)    All other components within normal limits  CSF CULTURE  GRAM STAIN  LIPASE, BLOOD  CSF CELL COUNT WITH DIFFERENTIAL  CSF CELL COUNT WITH DIFFERENTIAL  GLUCOSE, CSF  PROTEIN, CSF    Imaging Review Mr [name] Contrast  08/14/2015  CLINICAL DATA:  55 year old female with lower body tingling for 3 weeks. Progressive symptoms for 2 days. Neck pain. Numbness in the legs. Initial encounter. EXAM: MRI HEAD WITHOUT AND  WITH CONTRAST MRI CERVICAL SPINE WITHOUT AND WITH CONTRAST TECHNIQUE: Multiplanar, multiecho pulse sequences of the brain and surrounding structures, and cervical spine, to include the craniocervical junction and cervicothoracic junction, were obtained without and with intravenous contrast. CONTRAST:  18mL MULTIHANCE GADOBENATE DIMEGLUMINE 529 MG/ML IV SOLN COMPARISON:  None. FINDINGS: MRI HEAD FINDINGS There is abnormal right periatrial and temporal horn T2 and FLAIR hyperintensity which has associated T2 shine through on diffusion (series 100, image 22 and series 101, image 19), but does not appear definitely restricted on ADC. No restricted diffusion elsewhere. Major intracranial vascular flow voids are preserved. Cerebral volume appears normal. There are otherwise scattered small nonspecific white matter T2 and FLAIR hyperintense foci in the frontal lobes, more so the right (series 106, image  19). No corpus callosum involvement. No cortical encephalomalacia or cortical edema identified. No chronic cerebral blood products. Deep gray matter nuclei, brainstem, and cerebellum are within normal limits for age. However, following contrast there is multifocal abnormal enhancement about the right lateral ventricle, including at the right frontal horn (series 13, image 56) along the atrium at the area of diffusion abnormality, and continuing along the right temporal horn even to the right para hippocampal gyrus (series 13, images 45 and 39). Furthermore there appears to be a punctate area of abnormal enhancement in the anterior right temporal tip on series 13, image 41, but this is not confirmed in the other imaging planes. A round 4 5 mm focus of abnormal enhancement anterior and inferior to the right frontal horn (series 13, image 48) partially affects the right caudate nucleus but is not associated with abnormal T2, FLAIR signal, or definite changes on diffusion. There is questionable left temporal horn periventricular enhancement also on the left (series 13, image 45). There is no ependymoma enhancement. There is no debris identified within the ventricles or ventriculomegaly. No associated dural thickening or leptomeningeal enhancement. No midline shift, mass effect, extra-axial collection or acute intracranial hemorrhage. Negative pituitary. No definite cervicomedullary junction abnormality, although there is questionable faint abnormal enhancement along the ventral fourth ventricle on series 15, image 10. Visible internal auditory structures appear normal. Mastoids are clear. Minimal paranasal sinus mucosal thickening. Orbits soft tissues appear normal. Negative scalp soft tissues. Visualized bone marrow signal is within normal limits. MRI CERVICAL SPINE FINDINGS No definite abnormal enhancement in the lower brainstem or at the cervicomedullary junction on these images. However, there is a 1.9 cm length  cervical spinal cord lesion centered at C2-C3 with patchy abnormal enhancement. The dorsal columns appear most affected on series 9, image 3. See also series 8, image 7. There is associated mild T2 and FLAIR hyperintensity corresponding to the area of enhancement and perhaps slight cord expansion. There is otherwise no spinal cord edema. The remaining cervical spinal cord signal and morphology is normal. The visualized upper thoracic spinal cord appears normal. No other abnormal intradural enhancement. No associated dural thickening. Preserved cervical lordosis. No marrow edema or evidence of acute osseous abnormality. C5-C6 left eccentric cervical disc bulge and protrusion with mild spinal stenosis. No cord mass effect. Mild left C6 foraminal stenosis. Left side upper cervical spine moderate facet hypertrophy. No other cervical spinal stenosis. Negative paraspinal soft tissues including the major vascular flow voids and lung apices. IMPRESSION: 1. Multiple predominantly sub-centimeter cerebral and upper cervical spinal cord lesions which are variably associated with T2/FLAIR abnormality. Periventricular pattern of cerebral involvement is noted. No associated cerebral mass effect. There is mild cord expansion corresponding to the largest of  the lesions which measures 1.9 cm at C2-C3. No associated meningeal or dural enhancement. 2. Infectious or inflammatory process is favored (including atypical CNS infections, Neurosarcoidosis, demyelinating disease from MS or ADEM), but Lymphoma can have a periventricular predilection. Astrocytoma or other CNS tumor is not favored, and metastatic disease seems very unlikely in light of little to no associated edema. CSF analysis may be most valuable at this point. 3. Mild cervical spine degeneration, with mild multifactorial degenerative spinal stenosis at C5-C6. Electronically Signed   By: Genevie Ann M.D.   On: 08/14/2015 11:47   Mr Cervical Spine W Wo Contrast  08/14/2015   CLINICAL DATA:  55 year old female with lower body tingling for 3 weeks. Progressive symptoms for 2 days. Neck pain. Numbness in the legs. Initial encounter. EXAM: MRI HEAD WITHOUT AND WITH CONTRAST MRI CERVICAL SPINE WITHOUT AND WITH CONTRAST TECHNIQUE: Multiplanar, multiecho pulse sequences of the brain and surrounding structures, and cervical spine, to include the craniocervical junction and cervicothoracic junction, were obtained without and with intravenous contrast. CONTRAST:  12mL MULTIHANCE GADOBENATE DIMEGLUMINE 529 MG/ML IV SOLN COMPARISON:  None. FINDINGS: MRI HEAD FINDINGS There is abnormal right periatrial and temporal horn T2 and FLAIR hyperintensity which has associated T2 shine through on diffusion (series 100, image 22 and series 101, image 19), but does not appear definitely restricted on ADC. No restricted diffusion elsewhere. Major intracranial vascular flow voids are preserved. Cerebral volume appears normal. There are otherwise scattered small nonspecific white matter T2 and FLAIR hyperintense foci in the frontal lobes, more so the right (series 106, image 19). No corpus callosum involvement. No cortical encephalomalacia or cortical edema identified. No chronic cerebral blood products. Deep gray matter nuclei, brainstem, and cerebellum are within normal limits for age. However, following contrast there is multifocal abnormal enhancement about the right lateral ventricle, including at the right frontal horn (series 13, image 56) along the atrium at the area of diffusion abnormality, and continuing along the right temporal horn even to the right para hippocampal gyrus (series 13, images 45 and 39). Furthermore there appears to be a punctate area of abnormal enhancement in the anterior right temporal tip on series 13, image 41, but this is not confirmed in the other imaging planes. A round 4 5 mm focus of abnormal enhancement anterior and inferior to the right frontal horn (series 13, image 48)  partially affects the right caudate nucleus but is not associated with abnormal T2, FLAIR signal, or definite changes on diffusion. There is questionable left temporal horn periventricular enhancement also on the left (series 13, image 45). There is no ependymoma enhancement. There is no debris identified within the ventricles or ventriculomegaly. No associated dural thickening or leptomeningeal enhancement. No midline shift, mass effect, extra-axial collection or acute intracranial hemorrhage. Negative pituitary. No definite cervicomedullary junction abnormality, although there is questionable faint abnormal enhancement along the ventral fourth ventricle on series 15, image 10. Visible internal auditory structures appear normal. Mastoids are clear. Minimal paranasal sinus mucosal thickening. Orbits soft tissues appear normal. Negative scalp soft tissues. Visualized bone marrow signal is within normal limits. MRI CERVICAL SPINE FINDINGS No definite abnormal enhancement in the lower brainstem or at the cervicomedullary junction on these images. However, there is a 1.9 cm length cervical spinal cord lesion centered at C2-C3 with patchy abnormal enhancement. The dorsal columns appear most affected on series 9, image 3. See also series 8, image 7. There is associated mild T2 and FLAIR hyperintensity corresponding to the area of enhancement and  perhaps slight cord expansion. There is otherwise no spinal cord edema. The remaining cervical spinal cord signal and morphology is normal. The visualized upper thoracic spinal cord appears normal. No other abnormal intradural enhancement. No associated dural thickening. Preserved cervical lordosis. No marrow edema or evidence of acute osseous abnormality. C5-C6 left eccentric cervical disc bulge and protrusion with mild spinal stenosis. No cord mass effect. Mild left C6 foraminal stenosis. Left side upper cervical spine moderate facet hypertrophy. No other cervical spinal  stenosis. Negative paraspinal soft tissues including the major vascular flow voids and lung apices. IMPRESSION: 1. Multiple predominantly sub-centimeter cerebral and upper cervical spinal cord lesions which are variably associated with T2/FLAIR abnormality. Periventricular pattern of cerebral involvement is noted. No associated cerebral mass effect. There is mild cord expansion corresponding to the largest of the lesions which measures 1.9 cm at C2-C3. No associated meningeal or dural enhancement. 2. Infectious or inflammatory process is favored (including atypical CNS infections, Neurosarcoidosis, demyelinating disease from MS or ADEM), but Lymphoma can have a periventricular predilection. Astrocytoma or other CNS tumor is not favored, and metastatic disease seems very unlikely in light of little to no associated edema. CSF analysis may be most valuable at this point. 3. Mild cervical spine degeneration, with mild multifactorial degenerative spinal stenosis at C5-C6. Electronically Signed   By: Genevie Ann M.D.   On: 08/14/2015 11:47   I have personally reviewed and evaluated these images and lab results as part of my medical decision-making.   EKG Interpretation None      MDM   Final diagnoses:  Bilateral leg numbness  Bilateral leg numbness    55 yo woman with no significant past medical history who complains of vomiting and hiccups for few days and tingling in both feet and legs for 3 weeks after she had a 'fever" 3 weeks ago, and the tingling is worse when she flexes her neck.  Her neurologic exam is unremarkable with normal strength, and normal sensation in all extremities. Normal muscle tone, and DTRs in the knees were 2+ and symmetric  Broad differential: most likely distal sensory polyneuropathy,  GBS unlikely as it is not ascending neuropathy . Per patient has been tested for Lyme and it is negative. She is also not uremic and has no CKD as uremia could also cause neuropathy symptoms.  HIV  also on differential.   Given that her symptoms worsen when she flexes her neck, we discussed with Radiology, and ordered MRi of brain and C-spine. Her creatinine is normal.   MRI results pending      Burgess Estelle, MD 08/14/15 1111  Medical screening examination/treatment/procedure(s) were conducted as a shared visit with non-physician practitioner(s) or resident  and myself.  I personally evaluated the patient during the encounter and agree with the findings.   I have personally reviewed any xrays and/ or EKG's with the provider and I agree with interpretation.   Bilateral leg numbness - Plan: CANCELED: MR Thoracic Spine W Wo Contrast, CANCELED: MR Lumbar Spine W Wo Contrast, CANCELED: MR Thoracic Spine W Wo Contrast, CANCELED: MR Lumbar Spine W Wo Contrast  Bilateral leg numbness - Plan: CANCELED: MR Thoracic Spine W Wo Contrast, CANCELED: MR Lumbar Spine W Wo Contrast, CANCELED: MR Thoracic Spine W Wo Contrast, CANCELED: MR Lumbar Spine W Wo Contrast   Patient presents with worsening bilateral leg numbness, patient denies ascending symptoms. Unremarkable neurologic exam however with worsening symptoms especially with worsening upon neck flexion MRI ordered of the brain and C-spine.  Discussed the case in detail with radiology. MRI results concerning with broad differential diagnosis. Page neurology for further recommendations. Patient does not have a fever in the ER, no meningismus no neck pain.  LP performed by myself, one attempt. Admitted.  The patients results and plan were reviewed and discussed.   Any x-rays performed were independently reviewed by myself.   Differential diagnosis were considered with the presenting HPI.  Medications  sodium chloride 0.9 % bolus 500 mL (0 mLs Intravenous Stopped 08/14/15 1446)  ondansetron (ZOFRAN) injection 4 mg (4 mg Intravenous Given 08/14/15 0926)  gadobenate dimeglumine (MULTIHANCE) injection 20 mL (20 mLs Intravenous Contrast Given  08/14/15 1048)  lidocaine (PF) (XYLOCAINE) 1 % injection 5 mL (5 mLs Intradermal Given 08/14/15 1320)    Filed Vitals:   08/14/15 1230 08/14/15 1300 08/14/15 1330 08/14/15 1538  BP: 127/65 118/58 117/66 103/59  Pulse: 50 52 60 58  Temp:      TempSrc:      Resp:    20  Height:      Weight:      SpO2: 100% 100% 100% 100%    Final diagnoses:  Bilateral leg numbness    Admission/ observation were discussed with the admitting physician, patient and/or family and they are comfortable with the plan.    Elnora Morrison, MD 08/14/15 [phone]

## 2015-08-14 NOTE — ED Notes (Signed)
Pt returned from MRI °

## 2015-08-14 NOTE — H&P (Signed)
[facility] History and Physical  Grace Moyer [id number] DOB: 04-25-1960    PCP:   No primary care provider on file.   Chief Complaint:  Bilateral lower leg numbness, but no weakness, and no pain.   HPI: Grace Moyer is an 55 y.o. female with hx of partial hysterectomy, and no other medical illness on no chronic meds, but perhaps by virtue of hardly ever saw any physician, having history of trauma to her cervical region, and had left upper neck and left shoulder pain intermittently for several years, presented to the ER after seeing a provider a week ago, reportedly with normal labs, complaining of bilateral lower extremity numberness, especially with neck flexion.  She was evaluated, and found to have multiple subcentimeter T2/flair in her brain and cervical C2 C3 left stenosis, but no edema and no cord compression.  Her Labs were unremarkable.  After speaking with neurology Dr Merlene Laughter, who recommended a spinal tap, and start high dose steroid for presumed MS, hospitalist was asked to admit her for same.   Rewiew of Systems:  Constitutional: Negative for malaise, fever and chills. No significant weight loss or weight gain Eyes: Negative for eye pain, redness and discharge, diplopia, visual changes, or flashes of light. ENMT: Negative for ear pain, hoarseness, nasal congestion, sinus pressure and sore throat. No headaches; tinnitus, drooling, or problem swallowing. Cardiovascular: Negative for chest pain, palpitations, diaphoresis, dyspnea and peripheral edema. ; No orthopnea, PND Respiratory: Negative for cough, hemoptysis, wheezing and stridor. No pleuritic chestpain. Gastrointestinal: Negative for nausea, vomiting, diarrhea, constipation, abdominal pain, melena, blood in stool, hematemesis, jaundice and rectal bleeding.    Genitourinary: Negative for frequency, dysuria, incontinence,flank pain and hematuria; Musculoskeletal: Negative for back pain and neck pain. Negative for  swelling and trauma.;  Skin: . Negative for pruritus, rash, abrasions, bruising and skin lesion.; ulcerations Neuro: Negative for headache, lightheadedness and neck stiffness. Negative for weakness, altered level of consciousness , altered mental status, extremity weakness, burning feet, involuntary movement, seizure and syncope.  Psych: negative for anxiety, depression, insomnia, tearfulness, panic attacks, hallucinations, paranoia, suicidal or homicidal ideation    History reviewed. No pertinent past medical history.  Past Surgical History  Procedure Laterality Date  . Abdominal hysterectomy      partial    Medications:  HOME MEDS: Prior to Admission medications   Medication Sig Start Date End Date Taking? Authorizing Provider  naproxen sodium (ALEVE) 220 MG tablet Take 220 mg by mouth 2 (two) times daily as needed (pain/inflammation).   Yes Historical Provider, MD  omeprazole (PRILOSEC) 40 MG capsule Take 1 capsule by mouth daily. 08/13/15  Yes Historical Provider, MD     Allergies:  No Known Allergies  Social History:   reports that she has quit smoking. Her smoking use included Cigarettes. She does not have any smokeless tobacco history on file. She reports that she does not drink alcohol or use illicit drugs.  Family History: History reviewed. No pertinent family history.   Physical Exam: Filed Vitals:   08/14/15 1330 08/14/15 1530 08/14/15 1538 08/14/15 1619  BP: 117/66 103/59 103/59 105/91  Pulse: 60 52 58 62  Temp:      TempSrc:      Resp:   20 16  Height:      Weight:      SpO2: 100% 100% 100% 100%   Blood pressure 105/91, pulse 62, temperature 98.2 F (36.8 C), temperature source Oral, resp. rate 16, height 5\' 3"  (1.6 m), weight 91.627  kg (202 lb), SpO2 100 %.  GEN:  Pleasant patient lying in the stretcher in no acute distress; cooperative with exam. PSYCH:  alert and oriented x4; does not appear anxious or depressed; affect is appropriate. HEENT: Mucous  membranes pink and anicteric; PERRLA; EOM intact; no cervical lymphadenopathy nor thyromegaly or carotid bruit; no JVD; There were no stridor. Neck is very supple.  Her disc margin is blurred.  Breasts:: Not examined CHEST WALL: No tenderness CHEST: Normal respiration, clear to auscultation bilaterally.  HEART: Regular rate and rhythm.  There are no murmur, rub, or gallops.   BACK: No kyphosis or scoliosis; no CVA tenderness ABDOMEN: soft and non-tender; no masses, no organomegaly, normal abdominal bowel sounds; no pannus; no intertriginous candida. There is no rebound and no distention. Rectal Exam: Not done EXTREMITIES: No bone or joint deformity; age-appropriate arthropathy of the hands and knees; no edema; no ulcerations.  There is no calf tenderness. Genitalia: not examined PULSES: 2+ and symmetric SKIN: Normal hydration no rash or ulceration CNS: Cranial nerves 2-12 grossly intact no focal lateralizing neurologic deficit.  Speech is fluent; uvula elevated with phonation, facial symmetry and tongue midline. DTR are normal bilaterally, cerebella exam is intact, barbinski is negative and strengths are equaled bilaterally.  No sensory loss.   Labs on Admission:  Basic Metabolic Panel:  Recent Labs Lab 08/14/15 0800  NA 137  K 3.5  CL 106  CO2 20*  GLUCOSE 96  BUN 13  CREATININE 0.79  CALCIUM 8.9   Liver Function Tests:  Recent Labs Lab 08/14/15 0800  AST 18  ALT 13*  ALKPHOS 53  BILITOT 1.0  PROT 7.2  ALBUMIN 4.3    Recent Labs Lab 08/14/15 0800  LIPASE 14    Recent Labs Lab 08/14/15 0800  WBC 7.9  HGB 15.3*  HCT 43.9  MCV 85.7  PLT 211    Radiological Exams on Admission: Mr [name] Contrast  08/14/2015  CLINICAL DATA:  55 year old female with lower body tingling for 3 weeks. Progressive symptoms for 2 days. Neck pain. Numbness in the legs. Initial encounter. EXAM: MRI HEAD WITHOUT AND WITH CONTRAST MRI CERVICAL SPINE WITHOUT AND WITH CONTRAST  TECHNIQUE: Multiplanar, multiecho pulse sequences of the brain and surrounding structures, and cervical spine, to include the craniocervical junction and cervicothoracic junction, were obtained without and with intravenous contrast. CONTRAST:  37mL MULTIHANCE GADOBENATE DIMEGLUMINE 529 MG/ML IV SOLN COMPARISON:  None. FINDINGS: MRI HEAD FINDINGS There is abnormal right periatrial and temporal horn T2 and FLAIR hyperintensity which has associated T2 shine through on diffusion (series 100, image 22 and series 101, image 19), but does not appear definitely restricted on ADC. No restricted diffusion elsewhere. Major intracranial vascular flow voids are preserved. Cerebral volume appears normal. There are otherwise scattered small nonspecific white matter T2 and FLAIR hyperintense foci in the frontal lobes, more so the right (series 106, image 19). No corpus callosum involvement. No cortical encephalomalacia or cortical edema identified. No chronic cerebral blood products. Deep gray matter nuclei, brainstem, and cerebellum are within normal limits for age. However, following contrast there is multifocal abnormal enhancement about the right lateral ventricle, including at the right frontal horn (series 13, image 56) along the atrium at the area of diffusion abnormality, and continuing along the right temporal horn even to the right para hippocampal gyrus (series 13, images 45 and 39). Furthermore there appears to be a punctate area of abnormal enhancement in the anterior right temporal tip on series 13,  image 41, but this is not confirmed in the other imaging planes. A round 4 5 mm focus of abnormal enhancement anterior and inferior to the right frontal horn (series 13, image 48) partially affects the right caudate nucleus but is not associated with abnormal T2, FLAIR signal, or definite changes on diffusion. There is questionable left temporal horn periventricular enhancement also on the left (series 13, image 45). There  is no ependymoma enhancement. There is no debris identified within the ventricles or ventriculomegaly. No associated dural thickening or leptomeningeal enhancement. No midline shift, mass effect, extra-axial collection or acute intracranial hemorrhage. Negative pituitary. No definite cervicomedullary junction abnormality, although there is questionable faint abnormal enhancement along the ventral fourth ventricle on series 15, image 10. Visible internal auditory structures appear normal. Mastoids are clear. Minimal paranasal sinus mucosal thickening. Orbits soft tissues appear normal. Negative scalp soft tissues. Visualized bone marrow signal is within normal limits. MRI CERVICAL SPINE FINDINGS No definite abnormal enhancement in the lower brainstem or at the cervicomedullary junction on these images. However, there is a 1.9 cm length cervical spinal cord lesion centered at C2-C3 with patchy abnormal enhancement. The dorsal columns appear most affected on series 9, image 3. See also series 8, image 7. There is associated mild T2 and FLAIR hyperintensity corresponding to the area of enhancement and perhaps slight cord expansion. There is otherwise no spinal cord edema. The remaining cervical spinal cord signal and morphology is normal. The visualized upper thoracic spinal cord appears normal. No other abnormal intradural enhancement. No associated dural thickening. Preserved cervical lordosis. No marrow edema or evidence of acute osseous abnormality. C5-C6 left eccentric cervical disc bulge and protrusion with mild spinal stenosis. No cord mass effect. Mild left C6 foraminal stenosis. Left side upper cervical spine moderate facet hypertrophy. No other cervical spinal stenosis. Negative paraspinal soft tissues including the major vascular flow voids and lung apices. IMPRESSION: 1. Multiple predominantly sub-centimeter cerebral and upper cervical spinal cord lesions which are variably associated with T2/FLAIR  abnormality. Periventricular pattern of cerebral involvement is noted. No associated cerebral mass effect. There is mild cord expansion corresponding to the largest of the lesions which measures 1.9 cm at C2-C3. No associated meningeal or dural enhancement. 2. Infectious or inflammatory process is favored (including atypical CNS infections, Neurosarcoidosis, demyelinating disease from MS or ADEM), but Lymphoma can have a periventricular predilection. Astrocytoma or other CNS tumor is not favored, and metastatic disease seems very unlikely in light of little to no associated edema. CSF analysis may be most valuable at this point. 3. Mild cervical spine degeneration, with mild multifactorial degenerative spinal stenosis at C5-C6. Electronically Signed   By: Genevie Ann M.D.   On: 08/14/2015 11:47   Mr Cervical Spine W Wo Contrast  08/14/2015  CLINICAL DATA:  55 year old female with lower body tingling for 3 weeks. Progressive symptoms for 2 days. Neck pain. Numbness in the legs. Initial encounter. EXAM: MRI HEAD WITHOUT AND WITH CONTRAST MRI CERVICAL SPINE WITHOUT AND WITH CONTRAST TECHNIQUE: Multiplanar, multiecho pulse sequences of the brain and surrounding structures, and cervical spine, to include the craniocervical junction and cervicothoracic junction, were obtained without and with intravenous contrast. CONTRAST:  19mL MULTIHANCE GADOBENATE DIMEGLUMINE 529 MG/ML IV SOLN COMPARISON:  None. FINDINGS: MRI HEAD FINDINGS There is abnormal right periatrial and temporal horn T2 and FLAIR hyperintensity which has associated T2 shine through on diffusion (series 100, image 22 and series 101, image 19), but does not appear definitely restricted on ADC. No restricted diffusion  elsewhere. Major intracranial vascular flow voids are preserved. Cerebral volume appears normal. There are otherwise scattered small nonspecific white matter T2 and FLAIR hyperintense foci in the frontal lobes, more so the right (series 106, image  19). No corpus callosum involvement. No cortical encephalomalacia or cortical edema identified. No chronic cerebral blood products. Deep gray matter nuclei, brainstem, and cerebellum are within normal limits for age. However, following contrast there is multifocal abnormal enhancement about the right lateral ventricle, including at the right frontal horn (series 13, image 56) along the atrium at the area of diffusion abnormality, and continuing along the right temporal horn even to the right para hippocampal gyrus (series 13, images 45 and 39). Furthermore there appears to be a punctate area of abnormal enhancement in the anterior right temporal tip on series 13, image 41, but this is not confirmed in the other imaging planes. A round 4 5 mm focus of abnormal enhancement anterior and inferior to the right frontal horn (series 13, image 48) partially affects the right caudate nucleus but is not associated with abnormal T2, FLAIR signal, or definite changes on diffusion. There is questionable left temporal horn periventricular enhancement also on the left (series 13, image 45). There is no ependymoma enhancement. There is no debris identified within the ventricles or ventriculomegaly. No associated dural thickening or leptomeningeal enhancement. No midline shift, mass effect, extra-axial collection or acute intracranial hemorrhage. Negative pituitary. No definite cervicomedullary junction abnormality, although there is questionable faint abnormal enhancement along the ventral fourth ventricle on series 15, image 10. Visible internal auditory structures appear normal. Mastoids are clear. Minimal paranasal sinus mucosal thickening. Orbits soft tissues appear normal. Negative scalp soft tissues. Visualized bone marrow signal is within normal limits. MRI CERVICAL SPINE FINDINGS No definite abnormal enhancement in the lower brainstem or at the cervicomedullary junction on these images. However, there is a 1.9 cm length  cervical spinal cord lesion centered at C2-C3 with patchy abnormal enhancement. The dorsal columns appear most affected on series 9, image 3. See also series 8, image 7. There is associated mild T2 and FLAIR hyperintensity corresponding to the area of enhancement and perhaps slight cord expansion. There is otherwise no spinal cord edema. The remaining cervical spinal cord signal and morphology is normal. The visualized upper thoracic spinal cord appears normal. No other abnormal intradural enhancement. No associated dural thickening. Preserved cervical lordosis. No marrow edema or evidence of acute osseous abnormality. C5-C6 left eccentric cervical disc bulge and protrusion with mild spinal stenosis. No cord mass effect. Mild left C6 foraminal stenosis. Left side upper cervical spine moderate facet hypertrophy. No other cervical spinal stenosis. Negative paraspinal soft tissues including the major vascular flow voids and lung apices. IMPRESSION: 1. Multiple predominantly sub-centimeter cerebral and upper cervical spinal cord lesions which are variably associated with T2/FLAIR abnormality. Periventricular pattern of cerebral involvement is noted. No associated cerebral mass effect. There is mild cord expansion corresponding to the largest of the lesions which measures 1.9 cm at C2-C3. No associated meningeal or dural enhancement. 2. Infectious or inflammatory process is favored (including atypical CNS infections, Neurosarcoidosis, demyelinating disease from MS or ADEM), but Lymphoma can have a periventricular predilection. Astrocytoma or other CNS tumor is not favored, and metastatic disease seems very unlikely in light of little to no associated edema. CSF analysis may be most valuable at this point. 3. Mild cervical spine degeneration, with mild multifactorial degenerative spinal stenosis at C5-C6. Electronically Signed   By: Genevie Ann M.D.   On:  08/14/2015 11:47    Assessment/Plan Present on Admission:  . Neck  pain on left side . Abnormal MRI of head  PLAN:  I will admit her for bilateral leg numbness, worsen with neck flexion, and with abnormal MRI of the brain.  I am concerned about MS, or Devic's syndrome, but it is possible that she has neck pathology with stenosis, and MRI as an incidental finding.  Her funduscopic exam showed some blurred disc but they were in the medial portions which could be a normal finding.   LP was done, and the fluid was clear, unfortunately, it was not adequate to run all the tests in the world, so I recommended Glucose, Protein, and cell count.  The result showed a WBC of 38 WBC, predominantly lymphocyte, with normal protein and glucose.  This is not consistent with acute bacterial meningitis ( Dr Karolee Ohs agreed).  Will give her high dose steroids, and hold off on IVIG. Will get CGB and if elevated, please give SSI.  This will need to be at 1000mg  per day of Solumedrol IV, for three days.  Dr Merlene Laughter will see her in consultation.  I think CNS lymphoma or metastatic disease is in the differential, but much more unlikely.  She is stable, having no HA, and we will wait oligonal bands.  Her peripheral lyme test per her husband is negative.  I will get an HIV testing, B12, TSH as well.  Thank you so much and Good Day.   Other plans as per orders. Code Status: FULL Haskel Khan, MD. FACP [facility] Pager [phone] 7pm to 7am.  08/14/2015, 4:22 PM

## 2015-08-14 NOTE — ED Notes (Signed)
Pt being transported to MRI.

## 2015-08-14 NOTE — ED Notes (Signed)
Informed consent signed by patient 

## 2015-08-14 NOTE — ED Notes (Signed)
Lab called. Not enough CSF to perform all test ordered. Also #1 tube not collected. Notified Dr Reather Converse who consulted with Dr Marin Comment. Obtain bands, cell count, protein, and glucose on csf provided and save the remainder. Lab notified

## 2015-08-15 LAB — GLUCOSE, CAPILLARY
Glucose-Capillary: 159 mg/dL — ABNORMAL HIGH (ref 65–99)
Glucose-Capillary: 137 mg/dL — ABNORMAL HIGH (ref 65–99)
Glucose-Capillary: 177 mg/dL — ABNORMAL HIGH (ref 65–99)
Glucose-Capillary: 151 mg/dL — ABNORMAL HIGH (ref 65–99)

## 2015-08-15 LAB — C-REACTIVE PROTEIN: CRP: <0.5 mg/dL (ref <1.0)

## 2015-08-15 LAB — PATHOLOGIST SMEAR REVIEW

## 2015-08-15 NOTE — Progress Notes (Signed)
PROGRESS NOTE    Grace Moyer  [id number] DOB: 02-Oct-1960 DOA: 08/14/2015 PCP: No primary care provider on file.     Brief Narrative:  55 year old woman admitted on 6/13 with complaints of bilateral lower extremity numbness and tingling. MRI of the brain and C-spine shows multiple enhancing lesions within the cervical spine on the right hemisphere. Differential diagnosis is currently extensive. Has been admitted for workup and high-dose steroids.   Assessment & Plan:   Active Problems:   Bilateral leg numbness   Neck pain on left side   Abnormal MRI of head   Bilateral lower extremity numbness/abnormal MRI -Appreciate neurology input and recommendations. -At this point differential diagnosis is extensive and includes inflammatory disorders such as sarcoidosis, lupus, multiple sclerosis, also malignancy. -Will need outpatient PET scan, some CSF fluid studies are pending including oligoclonal bands, cytology.  -further recommendations as per neurology. -Continue high-dose steroids 3 days.   DVT prophylaxis: SCDs Code Status: Full code Family Communication: Husband at bedside updated on plan of care and all questions answered Disposition Plan: To be determined  Consultants:   Neurology  Procedures:   Lumbar puncture on 6/13  Antimicrobials:   None    Subjective: Anxious about diagnosis, still with continued bilateral lower extremity tingling  Objective: Filed Vitals:   08/14/15 2000 08/14/15 2140 08/15/15 0608 08/15/15 1411  BP:  110/59 108/65 128/64  Pulse:  62 64 70  Temp:  98.3 F (36.8 C) 98.1 F (36.7 C) 98.7 F (37.1 C)  TempSrc:  Oral Oral Oral  Resp:  20 20 20   Height:      Weight:      SpO2: 100% 100% 99% 98%    Intake/Output Summary (Last 24 hours) at 08/15/15 1715 Last data filed at 08/15/15 1440  Gross per 24 hour  Intake    360 ml  Output    400 ml  Net    -40 ml   Filed Weights   08/14/15 0743  Weight: 91.627 kg (202 lb)     Examination:  General exam: Alert, awake, oriented x 3 Respiratory system: Clear to auscultation. Respiratory effort normal. Cardiovascular system:RRR. No murmurs, rubs, gallops. Gastrointestinal system: Abdomen is nondistended, soft and nontender. No organomegaly or masses felt. Normal bowel sounds heard. Central nervous system: Alert and oriented. Bilateral lower extremity numbness and tingling from the waist down, no perceived motor or strength deficits. Extremities: No C/C/E, +pedal pulses Skin: No rashes, lesions or ulcers Psychiatry: Judgement and insight appear normal. Mood & affect appropriate.     Data Reviewed: I have personally reviewed following labs and imaging studies  CBC:  Recent Labs Lab 08/14/15 0800  WBC 7.9  HGB 15.3*  HCT 43.9  MCV 85.7  PLT 123456   Basic Metabolic Panel:  Recent Labs Lab 08/14/15 0800  NA 137  K 3.5  CL 106  CO2 20*  GLUCOSE 96  BUN 13  CREATININE 0.79  CALCIUM 8.9   GFR: Estimated Creatinine Clearance: 87.4 mL/min (by C-G formula based on Cr of 0.79). Liver Function Tests:  Recent Labs Lab 08/14/15 0800  AST 18  ALT 13*  ALKPHOS 53  BILITOT 1.0  PROT 7.2  ALBUMIN 4.3    Recent Labs Lab 08/14/15 0800  LIPASE 14   No results for input(s): AMMONIA in the last 168 hours. Coagulation Profile: No results for input(s): INR, PROTIME in the last 168 hours. Cardiac Enzymes: No results for input(s): CKTOTAL, CKMB, CKMBINDEX, TROPONINI in the last 168 hours. BNP (  last 3 results) No results for input(s): PROBNP in the last 8760 hours. HbA1C: No results for input(s): HGBA1C in the last 72 hours. CBG:  Recent Labs Lab 2015-08-25 2057 08/15/15 0750 08/15/15 1133 08/15/15 1632  GLUCAP 107* 159* 137* 177*   Lipid Profile: No results for input(s): CHOL, HDL, LDLCALC, TRIG, CHOLHDL, LDLDIRECT in the last 72 hours. Thyroid Function Tests:  Recent Labs  08/25/2015 0800  TSH 2.396   Anemia Panel: No results for  input(s): VITAMINB12, FOLATE, FERRITIN, TIBC, IRON, RETICCTPCT in the last 72 hours. Urine analysis:    Component Value Date/Time   COLORURINE YELLOW 08/25/15 [place] 2015/08/25 0841   LABSPEC 1.015 25-Aug-2015 0841   PHURINE 6.5 08/25/15 0841   GLUCOSEU NEGATIVE 2015/08/25 0841   HGBUR NEGATIVE 2015/08/25 0841   BILIRUBINUR SMALL* 08-25-15 0841   KETONESUR >80* 25-Aug-2015 0841   PROTEINUR TRACE* 08-25-2015 0841   UROBILINOGEN 0.2 04/08/2007 0620   NITRITE NEGATIVE August 25, 2015 0841   LEUKOCYTESUR NEGATIVE Aug 25, 2015 0841   Sepsis Labs: @LABRCNTIP (procalcitonin:4,lacticidven:4)  )No results found for this or any previous visit (from the past 240 hour(s)).       Radiology Studies: Mr [name] Contrast  08/25/15  CLINICAL DATA:  55 year old female with lower body tingling for 3 weeks. Progressive symptoms for 2 days. Neck pain. Numbness in the legs. Initial encounter. EXAM: MRI HEAD WITHOUT AND WITH CONTRAST MRI CERVICAL SPINE WITHOUT AND WITH CONTRAST TECHNIQUE: Multiplanar, multiecho pulse sequences of the brain and surrounding structures, and cervical spine, to include the craniocervical junction and cervicothoracic junction, were obtained without and with intravenous contrast. CONTRAST:  53mL MULTIHANCE GADOBENATE DIMEGLUMINE 529 MG/ML IV SOLN COMPARISON:  None. FINDINGS: MRI HEAD FINDINGS There is abnormal right periatrial and temporal horn T2 and FLAIR hyperintensity which has associated T2 shine through on diffusion (series 100, image 22 and series 101, image 19), but does not appear definitely restricted on ADC. No restricted diffusion elsewhere. Major intracranial vascular flow voids are preserved. Cerebral volume appears normal. There are otherwise scattered small nonspecific white matter T2 and FLAIR hyperintense foci in the frontal lobes, more so the right (series 106, image 19). No corpus callosum involvement. No cortical encephalomalacia or cortical  edema identified. No chronic cerebral blood products. Deep gray matter nuclei, brainstem, and cerebellum are within normal limits for age. However, following contrast there is multifocal abnormal enhancement about the right lateral ventricle, including at the right frontal horn (series 13, image 56) along the atrium at the area of diffusion abnormality, and continuing along the right temporal horn even to the right para hippocampal gyrus (series 13, images 45 and 39). Furthermore there appears to be a punctate area of abnormal enhancement in the anterior right temporal tip on series 13, image 41, but this is not confirmed in the other imaging planes. A round 4 5 mm focus of abnormal enhancement anterior and inferior to the right frontal horn (series 13, image 48) partially affects the right caudate nucleus but is not associated with abnormal T2, FLAIR signal, or definite changes on diffusion. There is questionable left temporal horn periventricular enhancement also on the left (series 13, image 45). There is no ependymoma enhancement. There is no debris identified within the ventricles or ventriculomegaly. No associated dural thickening or leptomeningeal enhancement. No midline shift, mass effect, extra-axial collection or acute intracranial hemorrhage. Negative pituitary. No definite cervicomedullary junction abnormality, although there is questionable faint abnormal enhancement along the ventral fourth ventricle on series 15, image 10.  Visible internal auditory structures appear normal. Mastoids are clear. Minimal paranasal sinus mucosal thickening. Orbits soft tissues appear normal. Negative scalp soft tissues. Visualized bone marrow signal is within normal limits. MRI CERVICAL SPINE FINDINGS No definite abnormal enhancement in the lower brainstem or at the cervicomedullary junction on these images. However, there is a 1.9 cm length cervical spinal cord lesion centered at C2-C3 with patchy abnormal enhancement.  The dorsal columns appear most affected on series 9, image 3. See also series 8, image 7. There is associated mild T2 and FLAIR hyperintensity corresponding to the area of enhancement and perhaps slight cord expansion. There is otherwise no spinal cord edema. The remaining cervical spinal cord signal and morphology is normal. The visualized upper thoracic spinal cord appears normal. No other abnormal intradural enhancement. No associated dural thickening. Preserved cervical lordosis. No marrow edema or evidence of acute osseous abnormality. C5-C6 left eccentric cervical disc bulge and protrusion with mild spinal stenosis. No cord mass effect. Mild left C6 foraminal stenosis. Left side upper cervical spine moderate facet hypertrophy. No other cervical spinal stenosis. Negative paraspinal soft tissues including the major vascular flow voids and lung apices. IMPRESSION: 1. Multiple predominantly sub-centimeter cerebral and upper cervical spinal cord lesions which are variably associated with T2/FLAIR abnormality. Periventricular pattern of cerebral involvement is noted. No associated cerebral mass effect. There is mild cord expansion corresponding to the largest of the lesions which measures 1.9 cm at C2-C3. No associated meningeal or dural enhancement. 2. Infectious or inflammatory process is favored (including atypical CNS infections, Neurosarcoidosis, demyelinating disease from MS or ADEM), but Lymphoma can have a periventricular predilection. Astrocytoma or other CNS tumor is not favored, and metastatic disease seems very unlikely in light of little to no associated edema. CSF analysis may be most valuable at this point. 3. Mild cervical spine degeneration, with mild multifactorial degenerative spinal stenosis at C5-C6. Electronically Signed   By: Genevie Ann M.D.   On: 08/14/2015 11:47   Mr Cervical Spine W Wo Contrast  08/14/2015  CLINICAL DATA:  55 year old female with lower body tingling for 3 weeks. Progressive  symptoms for 2 days. Neck pain. Numbness in the legs. Initial encounter. EXAM: MRI HEAD WITHOUT AND WITH CONTRAST MRI CERVICAL SPINE WITHOUT AND WITH CONTRAST TECHNIQUE: Multiplanar, multiecho pulse sequences of the brain and surrounding structures, and cervical spine, to include the craniocervical junction and cervicothoracic junction, were obtained without and with intravenous contrast. CONTRAST:  4mL MULTIHANCE GADOBENATE DIMEGLUMINE 529 MG/ML IV SOLN COMPARISON:  None. FINDINGS: MRI HEAD FINDINGS There is abnormal right periatrial and temporal horn T2 and FLAIR hyperintensity which has associated T2 shine through on diffusion (series 100, image 22 and series 101, image 19), but does not appear definitely restricted on ADC. No restricted diffusion elsewhere. Major intracranial vascular flow voids are preserved. Cerebral volume appears normal. There are otherwise scattered small nonspecific white matter T2 and FLAIR hyperintense foci in the frontal lobes, more so the right (series 106, image 19). No corpus callosum involvement. No cortical encephalomalacia or cortical edema identified. No chronic cerebral blood products. Deep gray matter nuclei, brainstem, and cerebellum are within normal limits for age. However, following contrast there is multifocal abnormal enhancement about the right lateral ventricle, including at the right frontal horn (series 13, image 56) along the atrium at the area of diffusion abnormality, and continuing along the right temporal horn even to the right para hippocampal gyrus (series 13, images 45 and 39). Furthermore there appears to be a punctate area  of abnormal enhancement in the anterior right temporal tip on series 13, image 41, but this is not confirmed in the other imaging planes. A round 4 5 mm focus of abnormal enhancement anterior and inferior to the right frontal horn (series 13, image 48) partially affects the right caudate nucleus but is not associated with abnormal T2,  FLAIR signal, or definite changes on diffusion. There is questionable left temporal horn periventricular enhancement also on the left (series 13, image 45). There is no ependymoma enhancement. There is no debris identified within the ventricles or ventriculomegaly. No associated dural thickening or leptomeningeal enhancement. No midline shift, mass effect, extra-axial collection or acute intracranial hemorrhage. Negative pituitary. No definite cervicomedullary junction abnormality, although there is questionable faint abnormal enhancement along the ventral fourth ventricle on series 15, image 10. Visible internal auditory structures appear normal. Mastoids are clear. Minimal paranasal sinus mucosal thickening. Orbits soft tissues appear normal. Negative scalp soft tissues. Visualized bone marrow signal is within normal limits. MRI CERVICAL SPINE FINDINGS No definite abnormal enhancement in the lower brainstem or at the cervicomedullary junction on these images. However, there is a 1.9 cm length cervical spinal cord lesion centered at C2-C3 with patchy abnormal enhancement. The dorsal columns appear most affected on series 9, image 3. See also series 8, image 7. There is associated mild T2 and FLAIR hyperintensity corresponding to the area of enhancement and perhaps slight cord expansion. There is otherwise no spinal cord edema. The remaining cervical spinal cord signal and morphology is normal. The visualized upper thoracic spinal cord appears normal. No other abnormal intradural enhancement. No associated dural thickening. Preserved cervical lordosis. No marrow edema or evidence of acute osseous abnormality. C5-C6 left eccentric cervical disc bulge and protrusion with mild spinal stenosis. No cord mass effect. Mild left C6 foraminal stenosis. Left side upper cervical spine moderate facet hypertrophy. No other cervical spinal stenosis. Negative paraspinal soft tissues including the major vascular flow voids and lung  apices. IMPRESSION: 1. Multiple predominantly sub-centimeter cerebral and upper cervical spinal cord lesions which are variably associated with T2/FLAIR abnormality. Periventricular pattern of cerebral involvement is noted. No associated cerebral mass effect. There is mild cord expansion corresponding to the largest of the lesions which measures 1.9 cm at C2-C3. No associated meningeal or dural enhancement. 2. Infectious or inflammatory process is favored (including atypical CNS infections, Neurosarcoidosis, demyelinating disease from MS or ADEM), but Lymphoma can have a periventricular predilection. Astrocytoma or other CNS tumor is not favored, and metastatic disease seems very unlikely in light of little to no associated edema. CSF analysis may be most valuable at this point. 3. Mild cervical spine degeneration, with mild multifactorial degenerative spinal stenosis at C5-C6. Electronically Signed   By: Genevie Ann M.D.   On: 08/14/2015 11:47        Scheduled Meds: . methylPREDNISolone (SOLU-MEDROL) injection  250 mg Intravenous Q6H  . pantoprazole  40 mg Oral Daily   Continuous Infusions: . sodium chloride 100 mL/hr at 08/14/15 1740     LOS: 1 day    Time spent: 30 minutes. Greater than 50% of this time was spent in direct contact with the patient coordinating care.     Lelon Frohlich, MD [facility] Pager [phone]  If 7PM-7AM, please contact night-coverage [email or web address] Password [email or web address] 08/15/2015, 5:15 PM

## 2015-08-15 NOTE — Progress Notes (Signed)
[place] A. Merlene Laughter, MD     [email or web address]          Grace Moyer is an 55 y.o. female.   Assessment/Plan: 1. Acute myelopathy with multiple enhancing lesions involving the central nervous system including the cervical spine and the right hemisphere. Differential diagnosis is extensive. The patient's age of presentation and the MRI findings seems not to point listed demyelination either multiple sclerosis or neuromyelitis optica/Devic's disease. The findings in my view are more concerning for central nervous system malignancy even a lymphoma or glioma. Certainly inflammatory processes such as sarcoidosis and vasculitis are also concerning.   2. UTI.      RECOMMENDATION:   The patient has been started on high-dose steroids which seems reasonable for symptomatic relief.  We should consider SPECT scan or PET scan to evaluate the hyperactivity of the lesions seen on MRI. This would help differentiate these lesions are inflammatory or malignant.  Additional labs will be done for the following: HIV, RPR, SSB/SSA for Sjogren's syndrome, ANA and vitamin B12 level. We will also follow-up the oligoclonal bands to evaluate for demyelination on spinal fluid. The results are pending and will be followed up. We may also want to get a serum angiotensin converting enzyme, serum amyloid and 25 vitamin D level.   Chest XRAY.       The patient has a lot of questions was were discussed and answered. So far we do not have a final diagnosis. The cytology seems to be unremarkable with essentially mononuclear cells seen. Other results are pending. She has warmer dose of Solu-Medrol tomorrow and likely can be discharged. Additional workup with PET scan will be arrange an outpatient setting.     GENERAL: This a very pleasant overweight female in no acute distress.  HEENT: Normal.  ABDOMEN: soft  EXTREMITIES: No edema   BACK: Normal.  SKIN: Normal by  inspection.   MENTAL STATUS: Alert and oriented. Speech, language and cognition are generally intact. Judgment and insight normal.   CRANIAL NERVES: Pupils are equal, round and reactive to light and accomodation; extra ocular movements are full, there is no significant nystagmus; visual fields are full; upper and lower facial muscles are normal in strength and symmetric, there is no flattening of the nasolabial folds; tongue is midline; uvula is midline; shoulder elevation is normal.  MOTOR: Normal tone, bulk and strength; no pronator drift.  COORDINATION: Left finger to nose is normal, right finger to nose is normal, No rest tremor; no intention tremor; no postural tremor; no bradykinesia.  REFLEXES: Deep tendon reflexes are symmetrical and normal. Babinski reflexes are flexor bilaterally.   SENSATION: Normal to light touch and temperature.       Objective: Vital signs in last 24 hours: Temp:  [98.1 F (36.7 C)-98.7 F (37.1 C)] 98.7 F (37.1 C) (06/14 1411) Pulse Rate:  [62-70] 70 (06/14 1411) Resp:  [20] 20 (06/14 1411) BP: (108-128)/(59-65) 128/64 mmHg (06/14 1411) SpO2:  [98 %-100 %] 98 % (06/14 1411)  Intake/Output from previous day: 06/13 0701 - 06/14 0700 In: -  Out: 400 [Urine:400] Intake/Output this shift:   Nutritional status: Diet regular Room service appropriate?: Yes; Fluid consistency:: Thin   Lab Results: Results for orders placed or performed during the hospital encounter of 08/14/15 (from the past 48 hour(s))  Lipase, blood     Status: None   Collection Time: 08/14/15  8:00 AM  Result Value Ref Range   Lipase 14 11 - 51 U/L  Comprehensive  metabolic panel     Status: Abnormal   Collection Time: 08/14/15  8:00 AM  Result Value Ref Range   Sodium 137 135 - 145 mmol/L   Potassium 3.5 3.5 - 5.1 mmol/L   Chloride 106 101 - 111 mmol/L   CO2 20 (L) 22 - 32 mmol/L   Glucose, Bld 96 65 - 99 mg/dL   BUN 13 6 - 20 mg/dL   Creatinine, Ser 0.79 0.44 - 1.00  mg/dL   Calcium 8.9 8.9 - 10.3 mg/dL   Total Protein 7.2 6.5 - 8.1 g/dL   Albumin 4.3 3.5 - 5.0 g/dL   AST 18 15 - 41 U/L   ALT 13 (L) 14 - 54 U/L   Alkaline Phosphatase 53 38 - 126 U/L   Total Bilirubin 1.0 0.3 - 1.2 mg/dL   GFR calc non Af Amer >60 >60 mL/min   GFR calc Af Amer >60 >60 mL/min    Comment: (NOTE) The eGFR has been calculated using the CKD EPI equation. This calculation has not been validated in all clinical situations. eGFR's persistently <60 mL/min signify possible Chronic Kidney Disease.    Anion gap 11 5 - 15  CBC     Status: Abnormal   Collection Time: 08/14/15  8:00 AM  Result Value Ref Range   WBC 7.9 4.0 - 10.5 K/uL   RBC 5.12 (H) 3.87 - 5.11 MIL/uL   Hemoglobin 15.3 (H) 12.0 - 15.0 g/dL   HCT 43.9 36.0 - 46.0 %   MCV 85.7 78.0 - 100.0 fL   MCH 29.9 26.0 - 34.0 pg   MCHC 34.9 30.0 - 36.0 g/dL   RDW 12.8 11.5 - 15.5 %   Platelets 211 150 - 400 K/uL  TSH     Status: None   Collection Time: 08/14/15  8:00 AM  Result Value Ref Range   TSH 2.396 0.350 - 4.500 uIU/mL  Urinalysis, Routine w reflex microscopic     Status: Abnormal   Collection Time: 08/14/15  8:41 AM  Result Value Ref Range   Color, Urine YELLOW YELLOW   APPearance CLEAR CLEAR   Specific Gravity, Urine 1.015 1.005 - 1.030   pH 6.5 5.0 - 8.0   Glucose, UA NEGATIVE NEGATIVE mg/dL   Hgb urine dipstick NEGATIVE NEGATIVE   Bilirubin Urine SMALL (A) NEGATIVE   Ketones, ur >80 (A) NEGATIVE mg/dL   Protein, ur TRACE (A) NEGATIVE mg/dL   Nitrite NEGATIVE NEGATIVE   Leukocytes, UA NEGATIVE NEGATIVE  Urine microscopic-add on     Status: Abnormal   Collection Time: 08/14/15  8:41 AM  Result Value Ref Range   Squamous Epithelial / LPF 6-30 (A) NONE SEEN   WBC, UA 0-5 0 - 5 WBC/hpf   RBC / HPF NONE SEEN 0 - 5 RBC/hpf   Bacteria, UA MANY (A) NONE SEEN   Casts HYALINE CASTS (A) NEGATIVE  CSF cell count with differential collection tube #: 4     Status: Abnormal   Collection Time: 08/14/15  2:30  PM  Result Value Ref Range   Tube # 4    Color, CSF COLORLESS COLORLESS   Appearance, CSF CLEAR CLEAR   Supernatant NOT INDICATED    RBC Count, CSF 18 (H) 0 /cu mm   WBC, CSF 38 (HH) 0 - 5 /cu mm    Comment: CRITICAL RESULT CALLED TO, READ BACK BY AND VERIFIED WITH: GARZON,S. AT 1700 ON 08/14/2015 BY BAUGHAM,M.    Segmented Neutrophils-CSF 0 0 - 6 %  Lymphs, CSF 97 (H) 40 - 80 %   Monocyte-Macrophage-Spinal Fluid 3 (L) 15 - 45 %   Eosinophils, CSF 0 0 - 1 %   Other Cells, CSF 0     Comment: PENDING PATHOLOGIST REVIEW  Glucose, CSF     Status: None   Collection Time: 08/14/15  2:30 PM  Result Value Ref Range   Glucose, CSF 46 40 - 70 mg/dL  Protein, CSF     Status: None   Collection Time: 08/14/15  2:30 PM  Result Value Ref Range   Total  Protein, CSF 38 15 - 45 mg/dL  Pathologist smear review     Status: None   Collection Time: 08/14/15  2:30 PM  Result Value Ref Range   Path Review Reviewed by Kalman Drape, M.D.     Comment: 06.14.17 INCREASED MONONUCLEAR CELLS. Performed at [facility]   Glucose, capillary     Status: Abnormal   Collection Time: 08/14/15  8:57 PM  Result Value Ref Range   Glucose-Capillary 107 (H) 65 - 99 mg/dL  C-reactive protein     Status: None   Collection Time: 08/14/15  9:37 PM  Result Value Ref Range   CRP <0.5 <1.0 mg/dL    Comment: Performed at [facility]  Sedimentation rate     Status: None   Collection Time: 08/14/15  9:37 PM  Result Value Ref Range   Sed Rate 8 0 - 22 mm/hr  Glucose, capillary     Status: Abnormal   Collection Time: 08/15/15  7:50 AM  Result Value Ref Range   Glucose-Capillary 159 (H) 65 - 99 mg/dL  Glucose, capillary     Status: Abnormal   Collection Time: 08/15/15 11:33 AM  Result Value Ref Range   Glucose-Capillary 137 (H) 65 - 99 mg/dL  Glucose, capillary     Status: Abnormal   Collection Time: 08/15/15  4:32 PM  Result Value Ref Range   Glucose-Capillary 177 (H) 65 - 99 mg/dL    Comment 1 Notify RN    Comment 2 Document in Chart     Lipid Panel No results for input(s): CHOL, TRIG, HDL, CHOLHDL, VLDL, LDLCALC in the last 72 hours.  Studies/Results:   Medications:  Scheduled Meds: . methylPREDNISolone (SOLU-MEDROL) injection  250 mg Intravenous Q6H  . pantoprazole  40 mg Oral Daily   Continuous Infusions: . sodium chloride 100 mL/hr at 08/14/15 1740   PRN Meds:.ondansetron **OR** ondansetron (ZOFRAN) IV     LOS: 1 day   Teneil Shiller A. Merlene Laughter, M.D.  Diplomate, Tax adviser of Psychiatry and Neurology ( Neurology).

## 2015-08-16 ENCOUNTER — Inpatient Hospital Stay (HOSPITAL_COMMUNITY): Payer: 59

## 2015-08-16 LAB — C DIFFICILE QUICK SCREEN W PCR REFLEX
C Diff antigen: NEGATIVE
C Diff interpretation: NEGATIVE
C Diff toxin: NEGATIVE

## 2015-08-16 LAB — RPR: RPR Ser Ql: NONREACTIVE

## 2015-08-16 LAB — GLUCOSE, CAPILLARY
Glucose-Capillary: 164 mg/dL — ABNORMAL HIGH (ref 65–99)
Glucose-Capillary: 174 mg/dL — ABNORMAL HIGH (ref 65–99)
Glucose-Capillary: 175 mg/dL — ABNORMAL HIGH (ref 65–99)
Glucose-Capillary: 160 mg/dL — ABNORMAL HIGH (ref 65–99)

## 2015-08-16 LAB — HIV ANTIBODY (ROUTINE TESTING W REFLEX): HIV Screen 4th Generation wRfx: NONREACTIVE

## 2015-08-16 LAB — SJOGRENS SYNDROME-A EXTRACTABLE NUCLEAR ANTIBODY: SSA (Ro) (ENA) Antibody, IgG: <0.2 AI (ref 0.0–0.9)

## 2015-08-16 LAB — HOMOCYSTEINE: Homocysteine: 9.6 umol/L (ref 0.0–15.0)

## 2015-08-16 LAB — SJOGRENS SYNDROME-B EXTRACTABLE NUCLEAR ANTIBODY: SSB (La) (ENA) Antibody, IgG: <0.2 AI (ref 0.0–0.9)

## 2015-08-16 LAB — OLIGOCLONAL BANDS, CSF + SERM

## 2015-08-16 MED ORDER — METHYLPREDNISOLONE SODIUM SUCC 125 MG IJ SOLR
INTRAMUSCULAR | Status: AC
  Filled 2015-08-16: qty 4

## 2015-08-16 NOTE — Progress Notes (Signed)
PROGRESS NOTE    Grace Moyer  [id number] DOB: [birth year] DOA: 08/14/2015 PCP: No primary care provider on file.     Brief Narrative:  55 year old woman admitted on 6/13 with complaints of bilateral lower extremity numbness and tingling. MRI of the brain and C-spine shows multiple enhancing lesions within the cervical spine on the right hemisphere. Differential diagnosis is currently extensive. Has been admitted for workup and high-dose steroids.   Assessment & Plan:   Active Problems:   Bilateral leg numbness   Neck pain on left side   Abnormal MRI of head   Bilateral lower extremity numbness/abnormal MRI -Appreciate neurology input and recommendations. -At this point differential diagnosis is extensive and includes inflammatory disorders such as sarcoidosis, lupus, multiple sclerosis, also malignancy. -Will need outpatient PET scan, some CSF fluid studies are pending including oligoclonal bands, cytology.  -further recommendations as per neurology. -Continue high-dose steroids 3 days.   DVT prophylaxis: SCDs Code Status: Full code Family Communication: Patient only Disposition Plan: Home in 24 hours  Consultants:   Neurology  Procedures:   Lumbar puncture on 6/13  Antimicrobials:   None    Subjective: Anxious about diagnosis, still with continued bilateral lower extremity tingling  Objective: Filed Vitals:   08/15/15 1411 08/15/15 2121 08/16/15 0510 08/16/15 1502  BP: 128/64 115/67 113/61 110/61  Pulse: 70 54 45 60  Temp: 98.7 F (37.1 C) 98.7 F (37.1 C) 98.7 F (37.1 C) 98.7 F (37.1 C)  TempSrc: Oral Oral Oral Oral  Resp: 20 20 19 20   Height:      Weight:      SpO2: 98% 98% 97% 99%    Intake/Output Summary (Last 24 hours) at 08/16/15 1701 Last data filed at 08/16/15 1200  Gross per 24 hour  Intake 4717.33 ml  Output      0 ml  Net 4717.33 ml   Filed Weights   08/14/15 0743  Weight: 91.627 kg (202 lb)    Examination:  General  exam: Alert, awake, oriented x 3 Respiratory system: Clear to auscultation. Respiratory effort normal. Cardiovascular system:RRR. No murmurs, rubs, gallops. Gastrointestinal system: Abdomen is nondistended, soft and nontender. No organomegaly or masses felt. Normal bowel sounds heard. Central nervous system: Alert and oriented. Bilateral lower extremity numbness and tingling from the waist down, no perceived motor or strength deficits. Extremities: No C/C/E, +pedal pulses Skin: No rashes, lesions or ulcers Psychiatry: Judgement and insight appear normal. Mood & affect appropriate.     Data Reviewed: I have personally reviewed following labs and imaging studies  CBC:  Recent Labs Lab 08/14/15 0800  WBC 7.9  HGB 15.3*  HCT 43.9  MCV 85.7  PLT 123456   Basic Metabolic Panel:  Recent Labs Lab 08/14/15 0800  NA 137  K 3.5  CL 106  CO2 20*  GLUCOSE 96  BUN 13  CREATININE 0.79  CALCIUM 8.9   GFR: Estimated Creatinine Clearance: 87.4 mL/min (by C-G formula based on Cr of 0.79). Liver Function Tests:  Recent Labs Lab 08/14/15 0800  AST 18  ALT 13*  ALKPHOS 53  BILITOT 1.0  PROT 7.2  ALBUMIN 4.3    Recent Labs Lab 08/14/15 0800  LIPASE 14   No results for input(s): AMMONIA in the last 168 hours. Coagulation Profile: No results for input(s): INR, PROTIME in the last 168 hours. Cardiac Enzymes: No results for input(s): CKTOTAL, CKMB, CKMBINDEX, TROPONINI in the last 168 hours. BNP (last 3 results) No results for input(s): PROBNP in the  last 8760 hours. HbA1C: No results for input(s): HGBA1C in the last 72 hours. CBG:  Recent Labs Lab 08/15/15 1632 08/15/15 2045 08/16/15 0830 08/16/15 1123 08/16/15 1538  GLUCAP 177* 151* 164* 174* 175*   Lipid Profile: No results for input(s): CHOL, HDL, LDLCALC, TRIG, CHOLHDL, LDLDIRECT in the last 72 hours. Thyroid Function Tests:  Recent Labs  08/14/15 0800  TSH 2.396   Anemia Panel: No results for input(s):  VITAMINB12, FOLATE, FERRITIN, TIBC, IRON, RETICCTPCT in the last 72 hours. Urine analysis:    Component Value Date/Time   COLORURINE YELLOW 08/14/2015 [place] 08/14/2015 0841   LABSPEC 1.015 08/14/2015 0841   PHURINE 6.5 08/14/2015 0841   GLUCOSEU NEGATIVE 08/14/2015 0841   HGBUR NEGATIVE 08/14/2015 0841   BILIRUBINUR SMALL* 08/14/2015 0841   KETONESUR >80* 08/14/2015 0841   PROTEINUR TRACE* 08/14/2015 0841   UROBILINOGEN 0.2 04/08/2007 0620   NITRITE NEGATIVE 08/14/2015 0841   LEUKOCYTESUR NEGATIVE 08/14/2015 0841   Sepsis Labs: @LABRCNTIP (procalcitonin:4,lacticidven:4)  ) Recent Results (from the past 240 hour(s))  C difficile quick scan w PCR reflex     Status: None   Collection Time: 08/15/15 11:15 PM  Result Value Ref Range Status   C Diff antigen NEGATIVE NEGATIVE Final   C Diff toxin NEGATIVE NEGATIVE Final   C Diff interpretation Negative for toxigenic C. difficile  Final         Radiology Studies: No results found.      Scheduled Meds: . methylPREDNISolone sodium succinate      . methylPREDNISolone (SOLU-MEDROL) injection  250 mg Intravenous Q6H  . pantoprazole  40 mg Oral Daily   Continuous Infusions: . sodium chloride Stopped (08/16/15 0700)     LOS: 2 days    Time spent: 20 minutes. Greater than 50% of this time was spent in direct contact with the patient coordinating care.     Lelon Frohlich, MD [facility] Pager [phone]  If 7PM-7AM, please contact night-coverage [email or web address] Password [email or web address] 08/16/2015, 5:01 PM

## 2015-08-16 NOTE — Progress Notes (Signed)
Initial visit offering emotional and spiritual support. Patient was tearful and asked for prayer. Her family was present-parents and husband. Prayed with her.

## 2015-08-16 NOTE — Progress Notes (Signed)
[place] A. Merlene Laughter, MD     [email or web address]          Grace Moyer is an 55 y.o. female.   Assessment/Plan: 1. Acute myelopathy with multiple enhancing lesions involving the central nervous system including the cervical spine and the right hemisphere. Differential diagnosis is extensive. The patient's age of presentation and the MRI findings seems not to point listed demyelination either multiple sclerosis or neuromyelitis optica/Devic's disease. The findings in my view are more concerning for central nervous system malignancy even a lymphoma or glioma. Certainly inflammatory processes such as sarcoidosis and vasculitis are also concerning.   2. UTI.  Overall, the patient remains unchanged. She still has evidence of Lhermitte sign with neck flex. This may be somewhat impaired better however. I had a lengthy discussion with the patient about where to proceed from here. She will complete her last dose of steroids tomorrow. So far the workup has been mostly unrevealing but the findings seems not to suggest vasculitis or collagen vascular disease. The differential still however includes malignancy, sarcoidosis and MS is less likely.    RECOMMENDATION:   The patient has been started on high-dose steroids which seems reasonable for symptomatic relief.  We should consider SPECT scan or PET scan to evaluate the hyperactivity of the lesions seen on MRI. This would help differentiate these lesions are inflammatory or malignant.  This will be arranged once she returns for follow-up in the office in 2 weeks. Also will consider repeat imaging with the brain done without contrast.   We may also want to get a serum angiotensin converting enzyme, serum amyloid and 25 vitamin D level.    Chest XRAY.        GENERAL: This a very pleasant overweight female in no acute distress.  HEENT: Normal.  ABDOMEN: soft  EXTREMITIES: No edema   BACK: Normal.  SKIN: Normal  by inspection.   MENTAL STATUS: Alert and oriented. Speech, language and cognition are generally intact. Judgment and insight normal.   CRANIAL NERVES: Pupils are equal, round and reactive to light and accomodation; extra ocular movements are full, there is no significant nystagmus; visual fields are full; upper and lower facial muscles are normal in strength and symmetric, there is no flattening of the nasolabial folds; tongue is midline; uvula is midline; shoulder elevation is normal.  MOTOR: Normal tone, bulk and strength; no pronator drift.  COORDINATION: Left finger to nose is normal, right finger to nose is normal, No rest tremor; no intention tremor; no postural tremor; no bradykinesia.  REFLEXES: Deep tendon reflexes are symmetrical and normal. Babinski reflexes are flexor bilaterally.   SENSATION: Normal to light touch and temperature.       Objective: Vital signs in last 24 hours: Temp:  [98.7 F (37.1 C)] 98.7 F (37.1 C) (06/15 1502) Pulse Rate:  [45-60] 60 (06/15 1502) Resp:  [19-20] 20 (06/15 1502) BP: (110-115)/(61-67) 110/61 mmHg (06/15 1502) SpO2:  [97 %-99 %] 99 % (06/15 1502)  Intake/Output from previous day: 06/14 0701 - 06/15 0700 In: 4597.3 [P.O.:600; I.V.:3633.3; IV Piggyback:364] Out: -  Intake/Output this shift: Total I/O In: 480 [P.O.:480] Out: -  Nutritional status: Diet regular Room service appropriate?: Yes; Fluid consistency:: Thin   Lab Results: Results for orders placed or performed during the hospital encounter of 08/14/15 (from the past 48 hour(s))  Glucose, capillary     Status: Abnormal   Collection Time: 08/14/15  8:57 PM  Result Value Ref Range   Glucose-Capillary  107 (H) 65 - 99 mg/dL  RPR     Status: None   Collection Time: 08/14/15  9:37 PM  Result Value Ref Range   RPR Ser Ql Non Reactive Non Reactive    Comment: (NOTE) Performed At: [facility] [place], [place] [id number] Lindon Romp MD  [id number]   Homocysteine     Status: None   Collection Time: 08/14/15  9:37 PM  Result Value Ref Range   Homocysteine 9.6 0.0 - 15.0 umol/L    Comment: (NOTE) Performed At: [facility] [address] [place], [place] [id number] Lindon Romp MD [id number]   C-reactive protein     Status: None   Collection Time: 08/14/15  9:37 PM  Result Value Ref Range   CRP <0.5 <1.0 mg/dL    Comment: Performed at [facility]  Sedimentation rate     Status: None   Collection Time: 08/14/15  9:37 PM  Result Value Ref Range   Sed Rate 8 0 - 22 mm/hr  Sjogrens syndrome-A extractable nuclear antibody     Status: None   Collection Time: 08/14/15  9:37 PM  Result Value Ref Range   SSA (Ro) (ENA) Antibody, IgG <0.2 0.0 - 0.9 AI    Comment: (NOTE) Performed At: [facility] [place], [place] [id number] Lindon Romp MD [id number]   Sjogrens syndrome-B extractable nuclear antibody     Status: None   Collection Time: 08/14/15  9:37 PM  Result Value Ref Range   SSB (La) (ENA) Antibody, IgG <0.2 0.0 - 0.9 AI    Comment: (NOTE) Performed At: [facility] [place], [place] [id number] Lindon Romp MD [id number]   HIV antibody (routine testing) (NOT for [facility])     Status: None   Collection Time: 08/14/15  9:37 PM  Result Value Ref Range   HIV Screen 4th Generation wRfx Non Reactive Non Reactive    Comment: (NOTE) Performed At: [facility] [address] [place], [place] [id number] Lindon Romp MD [id number]   Glucose, capillary     Status: Abnormal   Collection Time: 08/15/15  7:50 AM  Result Value Ref Range   Glucose-Capillary 159 (H) 65 - 99 mg/dL  Glucose, capillary     Status: Abnormal   Collection Time: 08/15/15 11:33 AM  Result Value Ref Range   Glucose-Capillary 137 (H) 65 - 99 mg/dL  Glucose, capillary     Status: Abnormal   Collection Time: 08/15/15  4:32 PM  Result Value Ref Range    Glucose-Capillary 177 (H) 65 - 99 mg/dL   Comment 1 Notify RN    Comment 2 Document in Chart   Glucose, capillary     Status: Abnormal   Collection Time: 08/15/15  8:45 PM  Result Value Ref Range   Glucose-Capillary 151 (H) 65 - 99 mg/dL   Comment 1 Notify RN    Comment 2 Document in Chart   C difficile quick scan w PCR reflex     Status: None   Collection Time: 08/15/15 11:15 PM  Result Value Ref Range   C Diff antigen NEGATIVE NEGATIVE   C Diff toxin NEGATIVE NEGATIVE   C Diff interpretation Negative for toxigenic C. difficile   Glucose, capillary     Status: Abnormal   Collection Time: 08/16/15  8:30 AM  Result Value Ref Range   Glucose-Capillary 164 (H) 65 - 99 mg/dL  Glucose, capillary     Status:  Abnormal   Collection Time: 08/16/15 11:23 AM  Result Value Ref Range   Glucose-Capillary 174 (H) 65 - 99 mg/dL  Glucose, capillary     Status: Abnormal   Collection Time: 08/16/15  3:38 PM  Result Value Ref Range   Glucose-Capillary 175 (H) 65 - 99 mg/dL   Comment 1 Notify RN    Comment 2 Document in Chart     Lipid Panel No results for input(s): CHOL, TRIG, HDL, CHOLHDL, VLDL, LDLCALC in the last 72 hours.  Studies/Results:    One (1) oligoclonal band was observed in the CSF, which was not  detected in the serum sample.  Interpretation:  Criteria for Positivity: Four (4) or more oligoclonal bands  observed only in the CSF have been shown to be most consistent with  MS using our method. [Fortini AS, Sanders EL, Weinshenker BG, and  Katzmann JA: Cerebrospinal Fluid Oligoclonal Bands in the  Diagnosis of Multiple Sclerosis. Am J Clin Pathol 120(5):672-675,  2003].  Oligoclonal bands that are present only in the CSF have been  associated with a variety of inflammatory brain diseases such as  multiple sclerosis (MS), subacute encephalitis, neurosyphilis,  etc. Increased IgG in the CSF is not specific for MS, but is an  indication of chronic neural inflammation.  Clinical correlation  indicated.  Approximately 2-3% of clinically confirmed MS patients show little  or no evidence of oligoclonal bands in the CSF; however  oligoclonal bands may develop as the disease progresses.  Oligoclonal Banding testing performed using Isoelectric Focusing  (IEF) and immunoblotting methodology.  Performed At: [facility]  [place], [place] [id number]  Lindon Romp MD [id number]       Medications:  Scheduled Meds: . methylPREDNISolone sodium succinate      . methylPREDNISolone (SOLU-MEDROL) injection  250 mg Intravenous Q6H  . pantoprazole  40 mg Oral Daily   Continuous Infusions: . sodium chloride Stopped (08/16/15 0700)   PRN Meds:.ondansetron **OR** ondansetron (ZOFRAN) IV     LOS: 2 days   Armetta Henri A. Merlene Laughter, M.D.  Diplomate, Tax adviser of Psychiatry and Neurology ( Neurology).

## 2015-08-17 DIAGNOSIS — R208 Other disturbances of skin sensation: Principal | ICD-10-CM

## 2015-08-17 LAB — VITAMIN D 25 HYDROXY (VIT D DEFICIENCY, FRACTURES): Vit D, 25-Hydroxy: 16.4 ng/mL — ABNORMAL LOW (ref 30.0–100.0)

## 2015-08-17 LAB — FANA STAINING PATTERNS: Homogeneous Pattern: 1:80 {titer}

## 2015-08-17 LAB — ANTINUCLEAR ANTIBODIES, IFA: ANA Ab, IFA: POSITIVE — AB

## 2015-08-17 LAB — GLUCOSE, CAPILLARY
Glucose-Capillary: 157 mg/dL — ABNORMAL HIGH (ref 65–99)
Glucose-Capillary: 137 mg/dL — ABNORMAL HIGH (ref 65–99)

## 2015-08-17 LAB — ANGIOTENSIN CONVERTING ENZYME: Angiotensin-Converting Enzyme: 16 U/L (ref 14–82)

## 2015-08-17 NOTE — Discharge Summary (Signed)
Grace Moyer, is a 55 y.o. female  DOB 1960/05/13  MRN [id number].  Admission date:  08/14/2015  Admitting Physician  Orvan Falconer, MD  Discharge Date:  08/17/2015   Primary MD  No primary care provider on file.  Recommendations for primary care physician for things to follow:  - to follow with neurology in 2 weeks regarding further workup   Admission Diagnosis  Bilateral leg numbness [R20.8]   Discharge Diagnosis  Bilateral leg numbness [R20.8]    Active Problems:   Bilateral leg numbness   Neck pain on left side   Abnormal MRI of head      History reviewed. No pertinent past medical history.  Past Surgical History  Procedure Laterality Date  . Abdominal hysterectomy      partial       History of present illness and  Hospital Course:     Kindly see H&P for history of present illness and admission details, please review complete Labs, Consult reports and Test reports for all details in brief  HPI  from the history and physical done on the day of admission 08/14/2015 DELASIA Moyer is an 55 y.o. female with hx of partial hysterectomy, and no other medical illness on no chronic meds, but perhaps by virtue of hardly ever saw any physician, having history of trauma to her cervical region, and had left upper neck and left shoulder pain intermittently for several years, presented to the ER after seeing a provider a week ago, reportedly with normal labs, complaining of bilateral lower extremity numberness, especially with neck flexion. She was evaluated, and found to have multiple subcentimeter T2/flair in her brain and cervical C2 C3 left stenosis, but no edema and no cord compression. Her Labs were unremarkable. After speaking with neurology Dr Merlene Laughter, who recommended a spinal tap, and start high dose steroid for presumed MS, hospitalist was asked to admit her for same.    Hospital Course    55 year old woman admitted on 6/13 with complaints of bilateral lower extremity numbness and tingling. MRI of the brain and C-spine shows multiple enhancing lesions within the cervical spine on the right hemisphere. Differential diagnosis is currently extensive. Has been admitted for workup and high-dose steroids.  Bilateral lower extremity numbness/abnormal MRI -Appreciate neurology input and recommendations. -At this point differential diagnosis is extensive and includes inflammatory disorders such as sarcoidosis, lupus, multiple sclerosis, also malignancy. -Will need outpatient PET scan, some CSF fluid studies are pending including oligoclonal bands, cytology.  -further recommendations as per neurology,to follow with neurology in 2 weeks regarding SPECT scan or PET scan and repeat imaging as an outpatient by neurology -Treated with high-dose steroids 3 days.  Discharge Condition:  stable   Follow UP  Follow-up Information    Follow up with [facility], [place], MD. Schedule an appointment as soon as possible for a visit in 2 weeks.   Specialty:  Neurology   Contact information:   [address] [place] [address] [phone]  Discharge Instructions  and  Discharge Medications    Discharge Instructions    Discharge instructions    Complete by:  As directed   Disposition Home    Diet: Heart Healthy , with feeding assistance and aspiration precautions.  For Heart failure patients - Check your Weight same time everyday, if you gain over 2 pounds, or you develop in leg swelling, experience more shortness of breath or chest pain, call your Primary MD immediately. Follow Cardiac Low Salt Diet and 1.5 lit/day fluid restriction.   On your next visit with your primary care physician please Get Medicines reviewed and adjusted.   Please request your Prim.MD to go over all Hospital Tests and Procedure/Radiological results at the follow up, please get all Hospital records  sent to your Prim MD by signing hospital release before you go home.   If you experience worsening of your admission symptoms, develop shortness of breath, life threatening emergency, suicidal or homicidal thoughts you must seek medical attention immediately by calling 911 or calling your MD immediately  if symptoms less severe.  You Must read complete instructions/literature along with all the possible adverse reactions/side effects for all the Medicines you take and that have been prescribed to you. Take any new Medicines after you have completely understood and accpet all the possible adverse reactions/side effects.   Do not drive, operating heavy machinery, perform activities at heights, swimming or participation in water activities or provide baby sitting services if your were admitted for syncope or siezures until you have seen by Primary MD or a Neurologist and advised to do so again.  Do not drive when taking Pain medications.    Do not take more than prescribed Pain, Sleep and Anxiety Medications  Special Instructions: If you have smoked or chewed Tobacco  in the last 2 yrs please stop smoking, stop any regular Alcohol  and or any Recreational drug use.  Wear Seat belts while driving.   Please note  You were cared for by a hospitalist during your hospital stay. If you have any questions about your discharge medications or the care you received while you were in the hospital after you are discharged, you can call the unit and asked to speak with the hospitalist on call if the hospitalist that took care of you is not available. Once you are discharged, your primary care physician will handle any further medical issues. Please note that NO REFILLS for any discharge medications will be authorized once you are discharged, as it is imperative that you return to your primary care physician (or establish a relationship with a primary care physician if you do not have one) for your aftercare  needs so that they can reassess your need for medications and monitor your lab values.     Increase activity slowly    Complete by:  As directed             Medication List    STOP taking these medications        ALEVE 220 MG tablet  Generic drug:  naproxen sodium      TAKE these medications        omeprazole 40 MG capsule  Commonly known as:  PRILOSEC  Take 1 capsule by mouth daily.          Diet and Activity recommendation: See Discharge Instructions above   Consults obtained -  Neurology   Major procedures and Radiology Reports - PLEASE review detailed and final reports for all details,  in brief -   LP   Dg Chest 2 View  08/16/2015  CLINICAL DATA:  Multiple sclerosis.  Chest pain. EXAM: CHEST  2 VIEW COMPARISON:  None. FINDINGS: Thoracic spondylosis. Heart size normal. The lungs appear clear. No pleural effusion identified. Upper mediastinal contour normal. IMPRESSION: 1.  No active cardiopulmonary disease is radiographically apparent. 2. Mild thoracic spondylosis. Electronically Signed   By: Van Clines M.D.   On: 08/16/2015 20:08   Dg Lumbar Spine 2-3 Views  08/02/2015  CLINICAL DATA:  Numbness in both legs from the waist down for 1 week, no injury EXAM: LUMBAR SPINE - 2-3 VIEW COMPARISON:  None. FINDINGS: The lumbar vertebrae are in normal alignment. Intervertebral disc spaces appear normal with very minimal degenerative change at L2-3 and L3-4 levels with slight anterior osteophyte formation. No compression deformity is seen. The SI joints are corticated. The bowel gas pattern is nonspecific. IMPRESSION: Normal alignment with minimal anterior osteophyte formation at L2-3 and L3-4. Electronically Signed   By: Ivar Drape M.D.   On: 08/02/2015 11:19   Mr Jeri Cos [id number] Contrast  08/14/2015  CLINICAL DATA:  55 year old female with lower body tingling for 3 weeks. Progressive symptoms for 2 days. Neck pain. Numbness in the legs. Initial encounter. EXAM: MRI HEAD  WITHOUT AND WITH CONTRAST MRI CERVICAL SPINE WITHOUT AND WITH CONTRAST TECHNIQUE: Multiplanar, multiecho pulse sequences of the brain and surrounding structures, and cervical spine, to include the craniocervical junction and cervicothoracic junction, were obtained without and with intravenous contrast. CONTRAST:  46mL MULTIHANCE GADOBENATE DIMEGLUMINE 529 MG/ML IV SOLN COMPARISON:  None. FINDINGS: MRI HEAD FINDINGS There is abnormal right periatrial and temporal horn T2 and FLAIR hyperintensity which has associated T2 shine through on diffusion (series 100, image 22 and series 101, image 19), but does not appear definitely restricted on ADC. No restricted diffusion elsewhere. Major intracranial vascular flow voids are preserved. Cerebral volume appears normal. There are otherwise scattered small nonspecific white matter T2 and FLAIR hyperintense foci in the frontal lobes, more so the right (series 106, image 19). No corpus callosum involvement. No cortical encephalomalacia or cortical edema identified. No chronic cerebral blood products. Deep gray matter nuclei, brainstem, and cerebellum are within normal limits for age. However, following contrast there is multifocal abnormal enhancement about the right lateral ventricle, including at the right frontal horn (series 13, image 56) along the atrium at the area of diffusion abnormality, and continuing along the right temporal horn even to the right para hippocampal gyrus (series 13, images 45 and 39). Furthermore there appears to be a punctate area of abnormal enhancement in the anterior right temporal tip on series 13, image 41, but this is not confirmed in the other imaging planes. A round 4 5 mm focus of abnormal enhancement anterior and inferior to the right frontal horn (series 13, image 48) partially affects the right caudate nucleus but is not associated with abnormal T2, FLAIR signal, or definite changes on diffusion. There is questionable left temporal horn  periventricular enhancement also on the left (series 13, image 45). There is no ependymoma enhancement. There is no debris identified within the ventricles or ventriculomegaly. No associated dural thickening or leptomeningeal enhancement. No midline shift, mass effect, extra-axial collection or acute intracranial hemorrhage. Negative pituitary. No definite cervicomedullary junction abnormality, although there is questionable faint abnormal enhancement along the ventral fourth ventricle on series 15, image 10. Visible internal auditory structures appear normal. Mastoids are clear. Minimal paranasal sinus mucosal thickening. Orbits soft tissues  appear normal. Negative scalp soft tissues. Visualized bone marrow signal is within normal limits. MRI CERVICAL SPINE FINDINGS No definite abnormal enhancement in the lower brainstem or at the cervicomedullary junction on these images. However, there is a 1.9 cm length cervical spinal cord lesion centered at C2-C3 with patchy abnormal enhancement. The dorsal columns appear most affected on series 9, image 3. See also series 8, image 7. There is associated mild T2 and FLAIR hyperintensity corresponding to the area of enhancement and perhaps slight cord expansion. There is otherwise no spinal cord edema. The remaining cervical spinal cord signal and morphology is normal. The visualized upper thoracic spinal cord appears normal. No other abnormal intradural enhancement. No associated dural thickening. Preserved cervical lordosis. No marrow edema or evidence of acute osseous abnormality. C5-C6 left eccentric cervical disc bulge and protrusion with mild spinal stenosis. No cord mass effect. Mild left C6 foraminal stenosis. Left side upper cervical spine moderate facet hypertrophy. No other cervical spinal stenosis. Negative paraspinal soft tissues including the major vascular flow voids and lung apices. IMPRESSION: 1. Multiple predominantly sub-centimeter cerebral and upper cervical  spinal cord lesions which are variably associated with T2/FLAIR abnormality. Periventricular pattern of cerebral involvement is noted. No associated cerebral mass effect. There is mild cord expansion corresponding to the largest of the lesions which measures 1.9 cm at C2-C3. No associated meningeal or dural enhancement. 2. Infectious or inflammatory process is favored (including atypical CNS infections, Neurosarcoidosis, demyelinating disease from MS or ADEM), but Lymphoma can have a periventricular predilection. Astrocytoma or other CNS tumor is not favored, and metastatic disease seems very unlikely in light of little to no associated edema. CSF analysis may be most valuable at this point. 3. Mild cervical spine degeneration, with mild multifactorial degenerative spinal stenosis at C5-C6. Electronically Signed   By: Genevie Ann M.D.   On: 08/14/2015 11:47   Mr Cervical Spine W Wo Contrast  08/14/2015  CLINICAL DATA:  55 year old female with lower body tingling for 3 weeks. Progressive symptoms for 2 days. Neck pain. Numbness in the legs. Initial encounter. EXAM: MRI HEAD WITHOUT AND WITH CONTRAST MRI CERVICAL SPINE WITHOUT AND WITH CONTRAST TECHNIQUE: Multiplanar, multiecho pulse sequences of the brain and surrounding structures, and cervical spine, to include the craniocervical junction and cervicothoracic junction, were obtained without and with intravenous contrast. CONTRAST:  60mL MULTIHANCE GADOBENATE DIMEGLUMINE 529 MG/ML IV SOLN COMPARISON:  None. FINDINGS: MRI HEAD FINDINGS There is abnormal right periatrial and temporal horn T2 and FLAIR hyperintensity which has associated T2 shine through on diffusion (series 100, image 22 and series 101, image 19), but does not appear definitely restricted on ADC. No restricted diffusion elsewhere. Major intracranial vascular flow voids are preserved. Cerebral volume appears normal. There are otherwise scattered small nonspecific white matter T2 and FLAIR hyperintense  foci in the frontal lobes, more so the right (series 106, image 19). No corpus callosum involvement. No cortical encephalomalacia or cortical edema identified. No chronic cerebral blood products. Deep gray matter nuclei, brainstem, and cerebellum are within normal limits for age. However, following contrast there is multifocal abnormal enhancement about the right lateral ventricle, including at the right frontal horn (series 13, image 56) along the atrium at the area of diffusion abnormality, and continuing along the right temporal horn even to the right para hippocampal gyrus (series 13, images 45 and 39). Furthermore there appears to be a punctate area of abnormal enhancement in the anterior right temporal tip on series 13, image 41, but this is  not confirmed in the other imaging planes. A round 4 5 mm focus of abnormal enhancement anterior and inferior to the right frontal horn (series 13, image 48) partially affects the right caudate nucleus but is not associated with abnormal T2, FLAIR signal, or definite changes on diffusion. There is questionable left temporal horn periventricular enhancement also on the left (series 13, image 45). There is no ependymoma enhancement. There is no debris identified within the ventricles or ventriculomegaly. No associated dural thickening or leptomeningeal enhancement. No midline shift, mass effect, extra-axial collection or acute intracranial hemorrhage. Negative pituitary. No definite cervicomedullary junction abnormality, although there is questionable faint abnormal enhancement along the ventral fourth ventricle on series 15, image 10. Visible internal auditory structures appear normal. Mastoids are clear. Minimal paranasal sinus mucosal thickening. Orbits soft tissues appear normal. Negative scalp soft tissues. Visualized bone marrow signal is within normal limits. MRI CERVICAL SPINE FINDINGS No definite abnormal enhancement in the lower brainstem or at the cervicomedullary  junction on these images. However, there is a 1.9 cm length cervical spinal cord lesion centered at C2-C3 with patchy abnormal enhancement. The dorsal columns appear most affected on series 9, image 3. See also series 8, image 7. There is associated mild T2 and FLAIR hyperintensity corresponding to the area of enhancement and perhaps slight cord expansion. There is otherwise no spinal cord edema. The remaining cervical spinal cord signal and morphology is normal. The visualized upper thoracic spinal cord appears normal. No other abnormal intradural enhancement. No associated dural thickening. Preserved cervical lordosis. No marrow edema or evidence of acute osseous abnormality. C5-C6 left eccentric cervical disc bulge and protrusion with mild spinal stenosis. No cord mass effect. Mild left C6 foraminal stenosis. Left side upper cervical spine moderate facet hypertrophy. No other cervical spinal stenosis. Negative paraspinal soft tissues including the major vascular flow voids and lung apices. IMPRESSION: 1. Multiple predominantly sub-centimeter cerebral and upper cervical spinal cord lesions which are variably associated with T2/FLAIR abnormality. Periventricular pattern of cerebral involvement is noted. No associated cerebral mass effect. There is mild cord expansion corresponding to the largest of the lesions which measures 1.9 cm at C2-C3. No associated meningeal or dural enhancement. 2. Infectious or inflammatory process is favored (including atypical CNS infections, Neurosarcoidosis, demyelinating disease from MS or ADEM), but Lymphoma can have a periventricular predilection. Astrocytoma or other CNS tumor is not favored, and metastatic disease seems very unlikely in light of little to no associated edema. CSF analysis may be most valuable at this point. 3. Mild cervical spine degeneration, with mild multifactorial degenerative spinal stenosis at C5-C6. Electronically Signed   By: Genevie Ann M.D.   On: 08/14/2015  11:47    Micro Results    Recent Results (from the past 240 hour(s))  C difficile quick scan w PCR reflex     Status: None   Collection Time: 08/15/15 11:15 PM  Result Value Ref Range Status   C Diff antigen NEGATIVE NEGATIVE Final   C Diff toxin NEGATIVE NEGATIVE Final   C Diff interpretation Negative for toxigenic C. difficile  Final       Today   Subjective:   Ghalia Bibbs today has no headache,no chest abdominal pain, feels much better wants to go home today.   Objective:   Blood pressure 102/53, pulse 60, temperature 98.4 F (36.9 C), temperature source Oral, resp. rate 20, height 5\' 3"  (1.6 m), weight 91.627 kg (202 lb), SpO2 99 %.   Intake/Output Summary (Last 24 hours) at  08/17/15 1050 Last data filed at 08/16/15 1200  Gross per 24 hour  Intake    240 ml  Output      0 ml  Net    240 ml    Exam Awake Alert, Oriented x 3, No new F.N deficits, Normal affect [place].AT,PERRAL Supple Neck,No JVD, No cervical lymphadenopathy appriciated.  Symmetrical Chest wall movement, Good air movement bilaterally, CTAB RRR,No Gallops,Rubs or new Murmurs, No Parasternal Heave +ve B.Sounds, Abd Soft, Non tender, No organomegaly appriciated, No rebound -guarding or rigidity. No Cyanosis, Clubbing or edema, No new Rash or bruise  Data Review   CBC w Diff: Lab Results  Component Value Date   WBC 7.9 08/14/2015   HGB 15.3* 08/14/2015   HCT 43.9 08/14/2015   PLT 211 08/14/2015    CMP: Lab Results  Component Value Date   NA 137 08/14/2015   K 3.5 08/14/2015   CL 106 08/14/2015   CO2 20* 08/14/2015   BUN 13 08/14/2015   CREATININE 0.79 08/14/2015   PROT 7.2 08/14/2015   ALBUMIN 4.3 08/14/2015   BILITOT 1.0 08/14/2015   ALKPHOS 53 08/14/2015   AST 18 08/14/2015   ALT 13* 08/14/2015  .   Total Time in preparing paper work, data evaluation and todays exam - 35 minutes  Nicolis Boody M.D on 08/17/2015 at 10:50 AM  [facility]   Office  [phone]

## 2015-08-17 NOTE — Progress Notes (Signed)
Discharge instructions given on medications,and follow up visits,patient,and family verbalized understanding. No /o pain or discomfort noted.Accompanied patient to an awaiting vehicle.

## 2015-08-17 NOTE — Discharge Instructions (Signed)
Follow with Primary MDin 7 days  ? ?Get CBC, CMP,  checked  by Primary MD next visit.  ? ? ?Activity: As tolerated with Full fall precautions use walker/cane & assistance as needed ? ?Disposition Home ? ? ?Diet: Heart Healthy  , with feeding assistance and aspiration precautions. ? ?For Heart failure patients - Check your Weight same time everyday, if you gain over 2 pounds, or you develop in leg swelling, experience more shortness of breath or chest pain, call your Primary MD immediately. Follow Cardiac Low Salt Diet and 1.5 lit/day fluid restriction. ? ? ?On your next visit with your primary care physician please Get Medicines reviewed and adjusted. ? ? ?Please request your Prim.MD to go over all Hospital Tests and Procedure/Radiological results at the follow up, please get all Hospital records sent to your Prim MD by signing hospital release before you go home. ? ? ?If you experience worsening of your admission symptoms, develop shortness of breath, life threatening emergency, suicidal or homicidal thoughts you must seek medical attention immediately by calling 911 or calling your MD immediately  if symptoms less severe. ? ?You Must read complete instructions/literature along with all the possible adverse reactions/side effects for all the Medicines you take and that have been prescribed to you. Take any new Medicines after you have completely understood and accpet all the possible adverse reactions/side effects.  ? ?Do not drive, operating heavy machinery, perform activities at heights, swimming or participation in water activities or provide baby sitting services if your were admitted for syncope or siezures until you have seen by Primary MD or a Neurologist and advised to do so again. ? ?Do not drive when taking Pain medications.  ? ? ?Do not take more than prescribed Pain, Sleep and Anxiety Medications ? ?Special Instructions: If you have smoked or chewed Tobacco  in the last 2 yrs please stop smoking, stop  any regular Alcohol  and or any Recreational drug use. ? ?Wear Seat belts while driving. ? ? ?Please note ? ?You were cared for by a hospitalist during your hospital stay. If you have any questions about your discharge medications or the care you received while you were in the hospital after you are discharged, you can call the unit and asked to speak with the hospitalist on call if the hospitalist that took care of you is not available. Once you are discharged, your primary care physician will handle any further medical issues. Please note that NO REFILLS for any discharge medications will be authorized once you are discharged, as it is imperative that you return to your primary care physician (or establish a relationship with a primary care physician if you do not have one) for your aftercare needs so that they can reassess your need for medications and monitor your lab values.  ? ?

## 2015-09-06 ENCOUNTER — Other Ambulatory Visit: Payer: Self-pay | Admitting: Neurosurgery

## 2015-09-06 DIAGNOSIS — G939 Disorder of brain, unspecified: Secondary | ICD-10-CM

## 2015-09-14 ENCOUNTER — Other Ambulatory Visit: Payer: BLUE CROSS/BLUE SHIELD

## 2015-09-21 ENCOUNTER — Other Ambulatory Visit: Payer: BLUE CROSS/BLUE SHIELD

## 2015-09-21 ENCOUNTER — Ambulatory Visit
Admission: RE | Admit: 2015-09-21 | Discharge: 2015-09-21 | Disposition: A | Discharge status: Home / Self Care | Payer: 59 | Source: Ambulatory Visit | Attending: Neurosurgery | Admitting: Neurosurgery

## 2015-09-21 DIAGNOSIS — G939 Disorder of brain, unspecified: Secondary | ICD-10-CM

## 2015-09-21 MED ORDER — GADOBENATE DIMEGLUMINE 529 MG/ML IV SOLN
18.0000 mL | Freq: Once | INTRAVENOUS | Status: AC | PRN
  Administered 2015-09-21: 18 mL via INTRAVENOUS

## 2015-10-12 DIAGNOSIS — G360 Neuromyelitis optica [Devic]: Secondary | ICD-10-CM | POA: Insufficient documentation

## 2016-01-22 ENCOUNTER — Other Ambulatory Visit: Payer: Self-pay | Admitting: Obstetrics & Gynecology

## 2016-01-22 DIAGNOSIS — R928 Other abnormal and inconclusive findings on diagnostic imaging of breast: Secondary | ICD-10-CM

## 2016-01-28 ENCOUNTER — Ambulatory Visit
Admission: RE | Admit: 2016-01-28 | Discharge: 2016-01-28 | Disposition: A | Discharge status: Home / Self Care | Payer: 59 | Source: Ambulatory Visit | Attending: Obstetrics & Gynecology | Admitting: Obstetrics & Gynecology

## 2016-01-28 ENCOUNTER — Other Ambulatory Visit: Payer: 59

## 2016-01-28 ENCOUNTER — Other Ambulatory Visit: Payer: Self-pay | Admitting: Obstetrics & Gynecology

## 2016-01-28 DIAGNOSIS — R599 Enlarged lymph nodes, unspecified: Secondary | ICD-10-CM

## 2016-01-28 DIAGNOSIS — R928 Other abnormal and inconclusive findings on diagnostic imaging of breast: Secondary | ICD-10-CM

## 2016-01-28 DIAGNOSIS — N6320 Unspecified lump in the left breast, unspecified quadrant: Secondary | ICD-10-CM

## 2016-01-29 ENCOUNTER — Other Ambulatory Visit: Payer: Self-pay | Admitting: Obstetrics & Gynecology

## 2016-01-29 ENCOUNTER — Ambulatory Visit
Admission: RE | Admit: 2016-01-29 | Discharge: 2016-01-29 | Disposition: A | Discharge status: Home / Self Care | Payer: 59 | Source: Ambulatory Visit | Attending: Obstetrics & Gynecology | Admitting: Obstetrics & Gynecology

## 2016-01-29 DIAGNOSIS — N6320 Unspecified lump in the left breast, unspecified quadrant: Secondary | ICD-10-CM

## 2016-01-29 DIAGNOSIS — R599 Enlarged lymph nodes, unspecified: Secondary | ICD-10-CM

## 2016-01-31 ENCOUNTER — Telehealth: Payer: Self-pay | Admitting: *Deleted

## 2016-01-31 ENCOUNTER — Encounter: Payer: Self-pay | Admitting: *Deleted

## 2016-01-31 DIAGNOSIS — Z171 Estrogen receptor negative status [ER-]: Secondary | ICD-10-CM | POA: Insufficient documentation

## 2016-01-31 DIAGNOSIS — C50412 Malignant neoplasm of upper-outer quadrant of left female breast: Secondary | ICD-10-CM

## 2016-01-31 HISTORY — DX: Malignant neoplasm of upper-outer quadrant of left female breast: C50.412

## 2016-01-31 NOTE — Telephone Encounter (Signed)
Confirmed [facility] for 02/06/16 at 0815 .  Instructions and contact information given.

## 2016-01-31 NOTE — Telephone Encounter (Signed)
Mailed [facility] packet to pt. 

## 2016-02-04 ENCOUNTER — Ambulatory Visit: Payer: 59 | Admitting: Radiation Oncology

## 2016-02-06 ENCOUNTER — Encounter: Payer: Self-pay | Admitting: Hematology

## 2016-02-06 ENCOUNTER — Ambulatory Visit: Payer: 59 | Attending: General Surgery | Admitting: Physical Therapy

## 2016-02-06 ENCOUNTER — Ambulatory Visit
Admission: RE | Admit: 2016-02-06 | Discharge: 2016-02-06 | Disposition: A | Discharge status: Home / Self Care | Payer: 59 | Source: Ambulatory Visit | Attending: Radiation Oncology | Admitting: Radiation Oncology

## 2016-02-06 ENCOUNTER — Encounter: Payer: Self-pay | Admitting: Physical Therapy

## 2016-02-06 ENCOUNTER — Ambulatory Visit (HOSPITAL_BASED_OUTPATIENT_CLINIC_OR_DEPARTMENT_OTHER): Payer: 59 | Admitting: Hematology

## 2016-02-06 ENCOUNTER — Encounter: Payer: Self-pay | Admitting: *Deleted

## 2016-02-06 ENCOUNTER — Other Ambulatory Visit (HOSPITAL_BASED_OUTPATIENT_CLINIC_OR_DEPARTMENT_OTHER): Payer: 59

## 2016-02-06 ENCOUNTER — Other Ambulatory Visit: Payer: Self-pay | Admitting: General Surgery

## 2016-02-06 VITALS — BP 131/49 | HR 67 | Temp 98.0°F | Resp 18 | Ht 63.0 in | Wt 205.2 lb

## 2016-02-06 DIAGNOSIS — C50412 Malignant neoplasm of upper-outer quadrant of left female breast: Secondary | ICD-10-CM

## 2016-02-06 DIAGNOSIS — Z171 Estrogen receptor negative status [ER-]: Secondary | ICD-10-CM | POA: Insufficient documentation

## 2016-02-06 DIAGNOSIS — R293 Abnormal posture: Secondary | ICD-10-CM | POA: Diagnosis present

## 2016-02-06 DIAGNOSIS — C773 Secondary and unspecified malignant neoplasm of axilla and upper limb lymph nodes: Secondary | ICD-10-CM

## 2016-02-06 DIAGNOSIS — G360 Neuromyelitis optica [Devic]: Secondary | ICD-10-CM | POA: Diagnosis not present

## 2016-02-06 DIAGNOSIS — R208 Other disturbances of skin sensation: Secondary | ICD-10-CM | POA: Diagnosis present

## 2016-02-06 LAB — CBC WITH DIFFERENTIAL/PLATELET
BASO%: 0.8 % (ref 0.0–2.0)
Basophils Absolute: 0 10*3/uL (ref 0.0–0.1)
EOS%: 2.1 % (ref 0.0–7.0)
Eosinophils Absolute: 0.1 10*3/uL (ref 0.0–0.5)
HCT: 44.4 % (ref 34.8–46.6)
HGB: 15 g/dL (ref 11.6–15.9)
LYMPH%: 22.1 % (ref 14.0–49.7)
MCH: 29.3 pg (ref 25.1–34.0)
MCHC: 33.9 g/dL (ref 31.5–36.0)
MCV: 86.3 fL (ref 79.5–101.0)
MONO#: 0.4 10*3/uL (ref 0.1–0.9)
MONO%: 6.8 % (ref 0.0–14.0)
NEUT#: 3.9 10*3/uL (ref 1.5–6.5)
NEUT%: 68.2 % (ref 38.4–76.8)
Platelets: 204 10*3/uL (ref 145–400)
RBC: 5.14 10*6/uL (ref 3.70–5.45)
RDW: 13.3 % (ref 11.2–14.5)
WBC: 5.7 10*3/uL (ref 3.9–10.3)
lymph#: 1.3 10*3/uL (ref 0.9–3.3)

## 2016-02-06 LAB — COMPREHENSIVE METABOLIC PANEL WITH GFR
ALT: 35 U/L (ref 0–55)
AST: 25 U/L (ref 5–34)
Albumin: 3.9 g/dL (ref 3.5–5.0)
Alkaline Phosphatase: 97 U/L (ref 40–150)
Anion Gap: 10 meq/L (ref 3–11)
BUN: 15.3 mg/dL (ref 7.0–26.0)
CO2: 25 meq/L (ref 22–29)
Calcium: 9.4 mg/dL (ref 8.4–10.4)
Chloride: 107 meq/L (ref 98–109)
Creatinine: 0.9 mg/dL (ref 0.6–1.1)
EGFR: 74 ml/min/1.73 m2 — ABNORMAL LOW
Glucose: 80 mg/dL (ref 70–140)
Potassium: 4.4 meq/L (ref 3.5–5.1)
Sodium: 142 meq/L (ref 136–145)
Total Bilirubin: 0.54 mg/dL (ref 0.20–1.20)
Total Protein: 7.2 g/dL (ref 6.4–8.3)

## 2016-02-06 NOTE — Progress Notes (Signed)
START ON PATHWAY REGIMEN - Breast  [id number]: Dose-Dense AC-T (Paclitaxel Weekly) - [Doxorubicin + Cyclophosphamide q14 Days x 4 Cycles, Followed by Paclitaxel 80 mg/m2 Weekly x 12 Weeks]  Dose-Dense AC q14 days:   A cycle is every 14 days:     Doxorubicin (Adriamycin(R)) 60 mg/m2 IV push on day 1 only. Dose Mod: None     Cyclophosphamide (Cytoxan(R)) 600 mg/m2 in 250 mL NS IV over 30 minutes on day 1 only. Dose Mod: None     Pegfilgrastim (Neulasta(R)) 6 mg flat dose subcutaneously once on day 2 only.  G-CSF recommended due to data showing a >20% risk of febrile neutropenia. Dose Mod: None  **Always confirm dose/schedule in your pharmacy ordering system**    Paclitaxel 80 mg/m2 Weekly:   Administer weekly:     Paclitaxel (Taxol(R)) 80 mg/m2 in 250 mL NS IV over 1 hour Dose Mod: None  **Always confirm dose/schedule in your pharmacy ordering system**    Patient Characteristics: Neoadjuvant Chemotherapy, HER2/neu Negative/Unknown/Equivocal AJCC Stage Grouping: IIB Current Disease Status: No Distant Mets or Local Recurrence AJCC M Stage: X ER Status: Negative (-) AJCC N Stage: X AJCC T Stage: X HER2/neu: Negative (-) PR Status: Negative (-)  Intent of Therapy: Curative Intent, Discussed with Patient

## 2016-02-06 NOTE — Progress Notes (Signed)
[place]  [phone] Fax:([phone]) [place] Note   Patient Care Team: Maisie Fus, MD as PCP - General (Obstetrics and Gynecology) Collene Gobble, MD as Attending Physician (Psychiatry) Rolm Bookbinder, MD as Consulting Physician (General Surgery) Truitt Merle, MD as Consulting Physician (Hematology) Kyung Rudd, MD as Consulting Physician (Radiation Oncology) 02/06/2016  CHIEF COMPLAINTS/PURPOSE OF CONSULTATION:  Left breast triple negative cancer  Oncology History   Malignant neoplasm of upper-outer quadrant of left female breast [facility])   Staging form: Breast, AJCC 7th Edition   - Clinical stage from 01/29/2016: Stage IIB (T2, N1, M0) - Signed by Truitt Merle, MD on 02/06/2016      Malignant neoplasm of upper-outer quadrant of left female breast ([place])   01/28/2016 Mammogram    Diagnostic MM and US showed a 2.9cm (3.2X2.2X2.5cm on [place]) mass in [place], multiple enlarged left axillary nodes, largest 2.5cm.       01/29/2016 Initial Diagnosis    Malignant neoplasm of upper-outer quadrant of left female breast ([place])      01/29/2016 Initial Biopsy    Left breast mass and axillary node biopsy showed IDC, G3,       01/29/2016 Receptors her2    ER-, PR-, HER2-, Ki67 85%      HISTORY OF PRESENTING ILLNESS:  Grace Moyer 55 y.o. female is here because of her recently diagnosed left breast cancer. She was accompanied By her husband to our multidisciplinary breast clinic today.  This was discovered by screening mammogram, she had no palpable breast mass or axilla note. She denies any other new symptoms. She underwent a diagnostic mammogram and ultrasound on 01/28/2016, which showed a 2.9 cm mass in the upper outer quadrant, and multiple enlarged left axillary nodes, largest 2.5 cm. She underwent core needle biopsy of the left breast mass and left axillary node, both showed invasive ductal carcinoma, grade 3, triple negative, Ki-67 85%.  She  developed bilateral numbness and tingling of her legs and hands at the end of May 2017. She underwent a multiple scans, and lab test, and was finally seen by neurologist Dr. Moshe Cipro, and was diagnosed with neuromyelitis optica spectrum disorder (NOSD). She initially received high-dose steroids, and then started Rituxan infusion, status post 2 infusions, now is on every four-month schedule, next due in Jan 2018. She states her neuropathy has been stable, she denies any significant vision problem, or other neurological symptoms. Her hand function and gait are normal. She is an Clinical biochemist.   GYN HISTORY  Menarchal: 13 LMP: 32 (hysterectomy) Contraceptive: 3 years  HRT: n/a  G1P1: 13 yo daughter    MEDICAL HISTORY:  Past Medical History:  Diagnosis Date  . Anxiety   . Malignant neoplasm of upper-outer quadrant of left female breast ([place]) 01/31/2016  . Neuromyelitis optica ([place])     SURGICAL HISTORY: Past Surgical History:  Procedure Laterality Date  . ABDOMINAL HYSTERECTOMY     partial    SOCIAL HISTORY: Social History   Social History  . Marital status: Married    Spouse name: N/A  . Number of children: N/A  . Years of education: N/A   Occupational History  . Not on file.   Social History Main Topics  . Smoking status: Former Smoker    Types: Cigarettes    Quit date: 09/03/2001  . Smokeless tobacco: Not on file  . Alcohol use No  . Drug use: No  . Sexual activity: Not on file   Other Topics  Concern  . Not on file   Social History Narrative  . No narrative on file    FAMILY HISTORY: Family History  Problem Relation Age of Onset  . Cancer Maternal Grandmother 16  . CAD Paternal Grandmother   . Colon cancer Paternal Uncle     ALLERGIES:  has No Known Allergies.  MEDICATIONS:  Current Outpatient Prescriptions  Medication Sig Dispense Refill  . Cholecalciferol (VITAMIN D-1000 MAX ST) 1000 units tablet Take 2 tablets by mouth daily.    . riTUXimab (RITUXAN) 100  MG/10ML injection Inject into the vein every 4 (four) months.    . gabapentin (NEURONTIN) 100 MG capsule Take 1 capsule by mouth 3 (three) times daily.    Marland Kitchen omeprazole (PRILOSEC) 40 MG capsule Take 1 capsule by mouth daily.     No current facility-administered medications for this visit.     REVIEW OF SYSTEMS:   Constitutional: Denies fevers, chills or abnormal night sweats Eyes: Denies blurriness of vision, double vision or watery eyes Ears, nose, mouth, throat, and face: Denies mucositis or sore throat Respiratory: Denies cough, dyspnea or wheezes Cardiovascular: Denies palpitation, chest discomfort or lower extremity swelling Gastrointestinal:  Denies nausea, heartburn or change in bowel habits Skin: Denies abnormal skin rashes Lymphatics: Denies new lymphadenopathy or easy bruising Neurological:Denies numbness, tingling or new weaknesses Behavioral/Psych: Mood is stable, no new changes  All other systems were reviewed with the patient and are negative.  PHYSICAL EXAMINATION: ECOG PERFORMANCE STATUS: 1 - Symptomatic but completely ambulatory  Vitals:   02/06/16 0911  BP: (!) 131/49  Pulse: 67  Resp: 18  Temp: 98 F (36.7 C)   Filed Weights   02/06/16 0911  Weight: 205 lb 3.2 oz (93.1 kg)    GENERAL:alert, no distress and comfortable SKIN: skin color, texture, turgor are normal, no rashes or significant lesions EYES: normal, conjunctiva are pink and non-injected, sclera clear OROPHARYNX:no exudate, no erythema and lips, buccal mucosa, and tongue normal  NECK: supple, thyroid normal size, non-tender, without nodularity LYMPH:  no palpable lymphadenopathy in the cervical, axillary or inguinal LUNGS: clear to auscultation and percussion with normal breathing effort HEART: regular rate & rhythm and no murmurs and no lower extremity edema ABDOMEN:abdomen soft, non-tender and normal bowel sounds Musculoskeletal:no cyanosis of digits and no clubbing  PSYCH: alert & oriented x  3 with fluent speech NEURO: no focal motor/sensory deficits Breasts: Breast inspection showed them to be symmetrical with no nipple discharge. Palpation of the breasts and axilla revealed no obvious mass that I could appreciate, there is fullness at the upper-out quadrant of left breast.    LABORATORY DATA:  I have reviewed the data as listed CBC Latest Ref Rng & Units 02/06/2016 08/14/2015 04/09/2007  WBC 3.9 - 10.3 10e3/uL 5.7 7.9 8.1  Hemoglobin 11.6 - 15.9 g/dL 15.0 15.3(H) 8.7 DELTA CHECK NOTED(L)  Hematocrit 34.8 - 46.6 % 44.4 43.9 26.1(L)  Platelets 145 - 400 10e3/uL 204 211 238   CMP Latest Ref Rng & Units 02/06/2016 08/14/2015  Glucose 70 - 140 mg/dl 80 96  BUN 7.0 - 26.0 mg/dL 15.3 13  Creatinine 0.6 - 1.1 mg/dL 0.9 0.79  Sodium 136 - 145 mEq/L 142 137  Potassium 3.5 - 5.1 mEq/L 4.4 3.5  Chloride 101 - 111 mmol/L - 106  CO2 22 - 29 mEq/L 25 20(L)  Calcium 8.4 - 10.4 mg/dL 9.4 8.9  Total Protein 6.4 - 8.3 g/dL 7.2 7.2  Total Bilirubin 0.20 - 1.20 mg/dL 0.54 1.0  Alkaline Phos 40 - 150 U/L 97 53  AST 5 - 34 U/L 25 18  ALT 0 - 55 U/L 35 13(L)   PATHOLOGY REPORT  Diagnosis 01/29/2016 1. Breast, left, needle core biopsy, 1:30 o'clock - INVASIVE DUCTAL CARCINOMA, GRADE 3. - LYMPHOVASCULAR INVOLVEMENT BY TUMOR. 2. Lymph node, needle/core biopsy, left axillary - METASTATIC CARCINOMA. Microscopic Comment 1. , 2. A breast prognostic profile will be performed  1. Results: IMMUNOHISTOCHEMICAL AND MORPHOMETRIC ANALYSIS PERFORMED MANUALLY Estrogen Receptor: 0%, NEGATIVE Progesterone Receptor: 0%, NEGATIVE Proliferation Marker Ki67: 85%  Results: HER2 - NEGATIVE RATIO OF HER2/CEP17 SIGNALS 1.26 AVERAGE HER2 COPY NUMBER PER CELL 1.95  2. FLUORESCENCE IN-SITU HYBRIDIZATION Results: HER2 - NEGATIVE RATIO OF HER2/CEP17 SIGNALS 1.25 AVERAGE HER2 COPY NUMBER PER CELL 2.00  Results: IMMUNOHISTOCHEMICAL AND MORPHOMETRIC ANALYSIS PERFORMED MANUALLY Estrogen Receptor: 0%,  NEGATIVE Progesterone Receptor: <1%, NEGATIVE, WEAK STAINING INTENSITY Proliferation Marker Ki67: 90%  RADIOGRAPHIC STUDIES: I have personally reviewed the radiological images as listed and agreed with the findings in the report. US Breast Ltd Uni Left Inc Axilla  Result Date: 01/28/2016 CLINICAL DATA:  Patient was called back from screening mammogram for a left breast mass. EXAM: 2D DIGITAL DIAGNOSTIC LEFT MAMMOGRAM WITH CAD AND ADJUNCT TOMO ULTRASOUND LEFT BREAST COMPARISON:  Previous exam(s). ACR Breast Density Category c: The breast tissue is heterogeneously dense, which may obscure small masses. FINDINGS: Additional imaging of the left breast was performed. There is persistence of an obscured spiculated 2.9 cm mass in the upper-outer quadrant of the breast. There are no malignant type microcalcifications. Enlarged lymph nodes are seen in the axilla. Mammographic images were processed with CAD. On physical exam, I palpate discrete thickening in the left breast at 1:30 8 cm from the nipple. Targeted ultrasound is performed, showing an irregular hypoechoic mass in the left breast at 1:30 8 cm from the nipple measuring 2.2 x 2.5 x 3.2 cm. Sonographic evaluation of the left axilla shows multiple enlarged lymph nodes. The largest lymph node measures 2.5 cm with a 1.1 cm cortex. IMPRESSION: Imaging findings worrisome for invasive mammary carcinoma in the left breast with axillary metastasis. RECOMMENDATION: Ultrasound-guided core biopsies of the left breast mass and of a axillary lymph node is recommended. I have discussed the findings and recommendations with the patient. Results were also provided in writing at the conclusion of the visit. If applicable, a reminder letter will be sent to the patient regarding the next appointment. BI-RADS CATEGORY  5: Highly suggestive of malignancy. Electronically Signed   By: Lillia Mountain M.D.   On: 01/28/2016 12:10   Mm Diag Breast Tomo Uni Left  Result Date:  01/28/2016 CLINICAL DATA:  Patient was called back from screening mammogram for a left breast mass. EXAM: 2D DIGITAL DIAGNOSTIC LEFT MAMMOGRAM WITH CAD AND ADJUNCT TOMO ULTRASOUND LEFT BREAST COMPARISON:  Previous exam(s). ACR Breast Density Category c: The breast tissue is heterogeneously dense, which may obscure small masses. FINDINGS: Additional imaging of the left breast was performed. There is persistence of an obscured spiculated 2.9 cm mass in the upper-outer quadrant of the breast. There are no malignant type microcalcifications. Enlarged lymph nodes are seen in the axilla. Mammographic images were processed with CAD. On physical exam, I palpate discrete thickening in the left breast at 1:30 8 cm from the nipple. Targeted ultrasound is performed, showing an irregular hypoechoic mass in the left breast at 1:30 8 cm from the nipple measuring 2.2 x 2.5 x 3.2 cm. Sonographic evaluation of the left axilla shows  multiple enlarged lymph nodes. The largest lymph node measures 2.5 cm with a 1.1 cm cortex. IMPRESSION: Imaging findings worrisome for invasive mammary carcinoma in the left breast with axillary metastasis. RECOMMENDATION: Ultrasound-guided core biopsies of the left breast mass and of a axillary lymph node is recommended. I have discussed the findings and recommendations with the patient. Results were also provided in writing at the conclusion of the visit. If applicable, a reminder letter will be sent to the patient regarding the next appointment. BI-RADS CATEGORY  5: Highly suggestive of malignancy. Electronically Signed   By: Lillia Mountain M.D.   On: 01/28/2016 12:10   Mm Clip Placement Left  Result Date: 01/29/2016 CLINICAL DATA:  Status post ultrasound-guided core biopsy of mass in the 130 o'clock location of the left breast and an enlarged left axillary lymph node. EXAM: DIAGNOSTIC LEFT MAMMOGRAM POST ULTRASOUND BIOPSY sense to COMPARISON:  Previous exam(s). FINDINGS: Mammographic images were  obtained following ultrasound guided biopsy of mass in the 130 o'clock location of the left breast and placement of a coil shaped clip. Coil shaped clip is identified in the upper-outer quadrant of the left breast within the mammographically detected mass. Within the left axilla, a spiral shaped clip is present in 1 of the enlarged left axillary lymph nodes. IMPRESSION: Tissue marker clips are in the expected locations after biopsy. Final Assessment: Post Procedure Mammograms for Marker Placement Electronically Signed   By: Nolon Nations M.D.   On: 01/29/2016 10:38   [place] Lt Breast Bx W Loc Dev 1st Lesion Img Bx Spec US Guide  Addendum Date: 01/31/2016   ADDENDUM REPORT: 01/30/2016 14:41 ADDENDUM: Pathology revealed GRADE III INVASIVE DUCTAL CARCINOMA with LYMPHOVASCULAR INVOLVEMENT BY TUMOR of the Left breast at the 1:30 o'clock location. METASTATIC CARCINOMA of the Left axillary lymph node. This was found to be concordant by Dr. Nolon Nations. Pathology results were discussed with the patient by telephone. The patient reported doing well after the biopsies with tenderness at the sites. Post biopsy instructions and care were reviewed and questions were answered. The patient was encouraged to call [facility] for any additional concerns. The patient was referred to [facility] on February 06, 2016. Pathology results reported by Terie Purser, RN on 01/30/2016. Electronically Signed   By: Nolon Nations M.D.   On: 01/30/2016 14:41   Result Date: 01/31/2016 CLINICAL DATA:  Patient presents for ultrasound-guided core biopsy of mass in the 130 o'clock location of the left breast. EXAM: ULTRASOUND GUIDED LEFT BREAST CORE NEEDLE BIOPSY COMPARISON:  Previous exam(s). FINDINGS: I met with the patient and we discussed the procedure of ultrasound-guided biopsy, including benefits and alternatives. We discussed  the high likelihood of a successful procedure. We discussed the risks of the procedure, including infection, bleeding, tissue injury, clip migration, and inadequate sampling. Informed written consent was given. The usual time-out protocol was performed immediately prior to the procedure. Using sterile technique and 1% Lidocaine as local anesthetic, under direct ultrasound visualization, a 12 gauge spring-loaded device was used to perform biopsy of mass in the 130 o'clock location of the left breast using a lateral approach. At the conclusion of the procedure a coil shaped tissue marker clip was deployed into the biopsy cavity. Follow up 2 view mammogram was performed and dictated separately. IMPRESSION: Ultrasound guided biopsy of left breast mass. No apparent complications. Electronically Signed: By: Nolon Nations M.D. On: 01/29/2016 10:21  [place] Lt Breast Bx W Loc Dev Ea Add Lesion Img Bx Spec US Guide  Result Date: 01/29/2016 CLINICAL DATA:  Patient presents for ultrasound-guided core biopsy of left axillary lymph node. EXAM: ULTRASOUND GUIDED CORE NEEDLE BIOPSY OF A LEFT AXILLARY NODE COMPARISON:  Previous exam(s). FINDINGS: I met with the patient and we discussed the procedure of ultrasound-guided biopsy, including benefits and alternatives. We discussed the high likelihood of a successful procedure. We discussed the risks of the procedure, including infection, bleeding, tissue injury, clip migration, and inadequate sampling. Informed written consent was given. The usual time-out protocol was performed immediately prior to the procedure. Using sterile technique and 1% Lidocaine as local anesthetic, under direct ultrasound visualization, a 14 gauge spring-loaded device was used to perform biopsy of enlarged left axillary lymph node using a lateral approach. At the conclusion of the procedure a spiral shaped HydroMARK tissue marker clip was deployed into the biopsy cavity. Follow up 2 view mammogram was  performed and dictated separately. IMPRESSION: Ultrasound guided biopsy of left axillary lymph node. No apparent complications. Electronically Signed   By: Nolon Nations M.D.   On: 01/29/2016 10:23    ASSESSMENT & PLAN: 55 year old Caucasian woman with past medical history of depression, presented with a screening mammogram discovered left breast cancer   1. Malignant neoplasm of upper-outer quadrant of left breast, invasive ductal carcinoma, G3, cT2N1Mx, stage IIB, ER-/PR-/HER2- -I reviewed her imaging findings and the biopsy results in great detail with patient and her husband. -Based on the mammogram, ultrasound and physical exam, she has at least stage IIB disease. Giving the aggressive nature of her triple negative disease, I recommend her to have a staging CT chest, abdomen and pelvis with contrast, and a bone scan to ruled out distant metastasis. -she was seen by Dr. Donne Hazel and surgical options were discussed  -We discussed that triple negative breast cancers are much more aggressive, and her risk of cancer recurrence after breast surgery is pretty high, giving the multiple positive lymph nodes. I recommend her to consider neoadjuvant chemotherapy, to reduce her risk of recurrence, and downstage her breast cancer, to make lumpectomy and sentinel lymph node biopsy as a more feasible surgery. -If her staging scan is negative for distant metastasis, I recommend her to have dose dense Adriamycin, Cytoxan every 2 weeks, with Neulasta support, for 4 cycles, followed by weekly carboplatin and Taxol for 12 weeks or carboplatin and docetaxel every 3 weeks for 4 cycles.  --Chemotherapy consent: Side effects including but does not not limited to, fatigue, nausea, vomiting, diarrhea, hair loss, neuropathy, fluid retention, renal and kidney dysfunction, neutropenic fever, needed for blood transfusion, bleeding, congestive heart failure, small risk of leukemia and MDS, were discussed with patient in great  detail. She agrees to proceed. -Due to her underlying NOSD, and existing peripheral neuropathy, my main concern of long-term side effects from chemotherapy is worsening neuropathy, especially from Taxol or Taxotere, we will monitor closely, and may need to hold it if her neuropathy gets worse.  -I'll tentatively schedule her chemotherapy to start later next week -she would benefit from adjuvant breast and axilla radiation to decrease risk of local recurrence. She was seen by radiation oncologist Dr. Lisbeth Renshaw today.  2. Genetics -Given her young age and triple negative disease, we recommend her to have genetic testing to ruled out inheritable breast cancer syndrome, and the test result may impact her surgery.  3. neuromyelitis optica spectrum disorder (NOSD) -she will continue follow up with her neurologist Dr.  Skeen at [facility]  -she is on rituximab every 4 weeks, next due in January 2018. It will be OK to continue her Rituximab when she is on chemotherapy, but the combination therapy will further compromised her immune system. -I'll update Dr. Moshe Cipro about her breast cancer treatment, to see if he has concerns.   Plan -Bilateral breast MRI  -Staging CT chest, abdomen and pelvis with contrast, bone scan next week -Echocardiogram -Port placement by Dr. Donne Hazel early next week -Chemotherapy class -Follow-up and first cycle chemotherapy later next week -Genetic referral -I will copy my note to Dr. Moshe Cipro    Orders Placed This Encounter  Procedures  . NM Bone Scan Whole Body    Standing Status:   Future    Standing Expiration Date:   02/05/2017    Order Specific Question:   Reason for Exam (SYMPTOM  OR DIAGNOSIS REQUIRED)    Answer:   new breast cancer staging with multiple enlarged lymph nodes with 2.5cm positive for metastatic disease    Order Specific Question:   Is the patient pregnant?    Answer:   No    Order Specific Question:   Preferred imaging location?    Answer:   [facility]    Order Specific Question:   If indicated for the ordered procedure, I authorize the administration of a radiopharmaceutical per Radiology protocol    Answer:   Yes  . CT Abdomen Pelvis W Contrast    Standing Status:   Future    Standing Expiration Date:   02/05/2017    Order Specific Question:   If indicated for the ordered procedure, I authorize the administration of contrast media per Radiology protocol    Answer:   Yes    Order Specific Question:   Reason for Exam (SYMPTOM  OR DIAGNOSIS REQUIRED)    Answer:   new breast cancer staging with multiple enlarged lymph nodes with 2.5cm lymph node positive for metastatic disease    Order Specific Question:   Is patient pregnant?    Answer:   No    Order Specific Question:   Preferred imaging location?    Answer:   [facility]  . CT Chest W Contrast    Standing Status:   Future    Standing Expiration Date:   02/05/2017    Order Specific Question:   If indicated for the ordered procedure, I authorize the administration of contrast media per Radiology protocol    Answer:   Yes    Order Specific Question:   Reason for Exam (SYMPTOM  OR DIAGNOSIS REQUIRED)    Answer:   new breast cancer staging/multiple enlarged lymph nodes with 2.5cm positive for metastatic disease    Order Specific Question:   Is patient pregnant?    Answer:   No    Order Specific Question:   Preferred imaging location?    Answer:   [facility]  . MR BREAST BILATERAL W WO CONTRAST    Standing Status:   Future    Standing Expiration Date:   04/08/2017    Order Specific Question:   If indicated for the ordered procedure, I authorize the administration of contrast media per Radiology protocol    Answer:   Yes    Order Specific Question:   Reason for Exam (SYMPTOM  OR DIAGNOSIS REQUIRED)    Answer:   new breast cancer/neoadjuvant chemo    Order Specific Question:   Preferred imaging location?    Answer:  [facility] (table limit-350 lbs)     Order Specific Question:   What is the patient's sedation requirement?    Answer:   No Sedation    Order Specific Question:   Does the patient have a pacemaker or implanted devices?    Answer:   No  . ECHOCARDIOGRAM COMPLETE    Standing Status:   Future    Standing Expiration Date:   05/06/2017    Order Specific Question:   Where should this test be performed    Answer:   [place]    Order Specific Question:   Complete or Limited study?    Answer:   Complete    Order Specific Question:   Does the patient have a known history of hypersensitivity to Perflutren (aka Scientist, research (medical) for echocardiograms - CHECK ALLERGIES)    Answer:   No    Order Specific Question:   ADMINISTER PERFLUTERN    Answer:   ADMINISTER PERFLUTREN    Order Specific Question:   Expected Date:    Answer:   Other - See Comments    Comments:   within 2 weeks    Order Specific Question:   Reason for exam-Echo    Answer:   Chemo  V67.2 / Z09    All questions were answered. The patient knows to call the clinic with any problems, questions or concerns. I spent 55 minutes counseling the patient face to face. The total time spent in the appointment was 60 minutes and more than 50% was on counseling.     Truitt Merle, MD 02/06/2016 4:34 PM

## 2016-02-06 NOTE — Therapy (Signed)
[place], [place], [address] Phone: [phone]   Fax:  [phone]  Physical Therapy Evaluation  Patient Details  Name: Grace Moyer MRN: [id number] Date of Birth: January 06, 1961 Referring Provider: Dr. Rolm Bookbinder  Encounter Date: 02/06/2016      PT End of Session - 02/06/16 1231    Visit Number 1   Number of Visits 1   PT Start Time 1024   PT Stop Time 0929  Also saw pt from 1055-1116 for a total of 32 minutes   PT Time Calculation (min) 11 min   Activity Tolerance Patient tolerated treatment well   Behavior During Therapy [facility] for tasks assessed/performed      Past Medical History:  Diagnosis Date  . Anxiety   . Malignant neoplasm of upper-outer quadrant of left female breast ([place]) 01/31/2016  . 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 ABDOMINAL HYSTERECTOMY     partial    There were no vitals filed for this visit.       Subjective Assessment - 02/06/16 1231    Subjective Patient reports she is here today to be seen by her medical team for her newly diagnosed left breast cancer.   Patient is accompained by: Family member   Pertinent History Patient was diagnosed on 01/30/16 with left Triple negative grade 3 invasive ductal carcinoma breast cancer. It is located in the upper outer quadrant and measures 3.5 cm with a Ki67 of 85%. There are known positive axillary lymph nodes.   Patient Stated Goals reduce lymphedema risk and learn post op shoulder ROM HEP            [facility] PT Assessment - 02/06/16 0001      Assessment   Medical Diagnosis Left breast cancer   Referring Provider Dr. Rolm Bookbinder   Onset Date/Surgical Date 01/30/16   Hand Dominance Right   Prior Therapy none     Precautions   Precautions Other (comment)   Precaution Comments Active cancer; neurological condition effecting mostly hands and feet     Restrictions   Weight Bearing Restrictions No     Balance Screen   Has the patient fallen in the past 6 months No   Has the patient had a decrease in activity level because of a fear of falling?  No   Is the patient reluctant to leave their home because of a fear of falling?  No     Home Environment   Living Environment Private residence   Living Arrangements Spouse/significant other;Children  Husband and 74 y.o. 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   Leisure She does not exercise     Cognition   Overall Cognitive Status Within Functional Limits for tasks assessed     Posture/Postural Control   Posture/Postural Control Postural limitations   Postural Limitations Rounded Shoulders;Forward head;Increased thoracic kyphosis   Posture Comments In testing cervical flexion, it ellicited a positive L'Hermitte's sign     ROM / Strength   AROM / PROM / Strength AROM;Strength     AROM   AROM Assessment Site Shoulder;Cervical   Right/Left Shoulder Right;Left   Right Shoulder Extension 65 Degrees   Right Shoulder Flexion 145 Degrees   Right Shoulder ABduction 148 Degrees  Right Shoulder Internal Rotation 80 Degrees   Right Shoulder External Rotation 89 Degrees   Left Shoulder Extension 53 Degrees   Left Shoulder Flexion 135 Degrees   Left Shoulder ABduction 136 Degrees   Left Shoulder Internal Rotation 76 Degrees   Left Shoulder External Rotation 85 Degrees   Cervical Flexion WNL but with positive L''Hermitte's sign   Cervical Extension 25% limited   Cervical - Right Side Bend WNL   Cervical - Left Side Bend 25% limited   Cervical - Right Rotation WNL   Cervical - Left Rotation WNL     Strength   Overall Strength Within functional limits for tasks performed           LYMPHEDEMA/ONCOLOGY  QUESTIONNAIRE - 02/06/16 1226      Type   Cancer Type Left breast cancer     Lymphedema Assessments   Lymphedema Assessments Upper extremities     Right Upper Extremity Lymphedema   10 cm Proximal to Olecranon Process 32.8 cm   Olecranon Process 26.2 cm   10 cm Proximal to Ulnar Styloid Process 24.7 cm   Just Proximal to Ulnar Styloid Process 16.7 cm   Across Hand at [employer] 19.5 cm   At [place] of 2nd Digit 6.7 cm     Left Upper Extremity Lymphedema   10 cm Proximal to Olecranon Process 31.9 cm   Olecranon Process 25.5 cm   10 cm Proximal to Ulnar Styloid Process 24.9 cm   Just Proximal to Ulnar Styloid Process 17.4 cm   Across Hand at [employer] 19.5 cm   At [place] of 2nd Digit 6.7 cm      Patient was instructed today in a home exercise program today for post op shoulder range of motion. These included active assist shoulder flexion in sitting, scapular retraction, wall walking with shoulder abduction, and hands behind head external rotation.  She was encouraged to do these twice a day, holding 3 seconds and repeating 5 times when permitted by her physician.          PT Education - 02/06/16 1230    Education provided Yes   Education Details Lymphedema risk reduction and post op shoulder ROM HEP   Person(s) Educated Patient;Spouse   Methods Explanation;Demonstration;Handout   Comprehension Returned demonstration;Verbalized understanding              Breast Clinic Goals - 02/06/16 1237      Patient will be able to verbalize understanding of pertinent lymphedema risk reduction practices relevant to her diagnosis specifically related to skin care.   Time 1   Period Days   Status Achieved     Patient will be able to return demonstrate and/or verbalize understanding of the post-op home exercise program related to regaining shoulder range of motion.   Time 1   Period Days   Status Achieved     Patient will be able to verbalize understanding of the  importance of attending the postoperative After Breast Cancer Class for further lymphedema risk reduction education and therapeutic exercise.   Time 1   Period Days   Status Achieved              Plan - 02/06/16 1232    Clinical Impression Statement Patient was diagnosed on 01/30/16 with left Triple negative grade 3 invasive ductal carcinoma breast cancer. It is located in the upper outer quadrant and measures 3.5 cm with a Ki67 of 85%. There are known positive axillary lymph nodes. Her  multidisciplinary medical team met prior to her assessment to determine recommended treatment plan.  She is planning to have and MRI, staging scans, genetic testing, neoadjuvant chemotherapy, likely a left lumpectomy and targeted axillary node dissection, and breast/axillary radiation. She may benefit from post op PT to regain shoulder ROM and reduce lymphedema risk.  Due to her recent diagnosis of neuromyelitis optica spectrum disorder which impacts her systemically and as her condition is evolving, her eval is of moderate complexity.   Rehab Potential Good   Clinical Impairments Affecting Rehab Potential Comorbidity of neuromyelitis optica spectrum disorder   PT Frequency One time visit   PT Treatment/Interventions Patient/family education;Therapeutic exercise   PT Next Visit Plan Will f/u after surgery to determine PT needs   PT Home Exercise Plan Post op shoulder ROM HEP   Consulted and Agree with Plan of Care Patient;Family member/caregiver   Family Member Consulted Husband      Patient will benefit from skilled therapeutic intervention in order to improve the following deficits and impairments:  Postural dysfunction, Pain, Decreased knowledge of precautions, Impaired UE functional use, Decreased range of motion  Visit Diagnosis: Carcinoma of upper-outer quadrant of left breast in female, estrogen receptor negative (HCC) - Plan: PT plan of care cert/re-cert  Abnormal posture - Plan: PT plan of care  cert/re-cert  Other disturbances of skin sensation - Plan: PT plan of care cert/re-cert   Patient will follow up at outpatient cancer rehab if needed following surgery.  If the patient requires physical therapy at that time, a specific plan will be dictated and sent to the referring physician for approval. The patient was educated today on appropriate basic range of motion exercises to begin post operatively and the importance of attending the After Breast Cancer class following surgery.  Patient was educated today on lymphedema risk reduction practices as it pertains to recommendations that will benefit the patient immediately following surgery.  She verbalized good understanding.  No additional physical therapy is indicated at this time.      Problem List Patient Active Problem List   Diagnosis Date Noted  . Neuromyelitis optica spectrum disorder (HCC) 02/06/2016  . Malignant neoplasm of upper-outer quadrant of left female breast (HCC) 01/31/2016  . Bilateral leg numbness 08/14/2015  . Neck pain on left side 08/14/2015  . Abnormal MRI of head 08/14/2015   Marti Cooper , PT 02/06/16 12:46 PM  [facility] [address] [place], [place], [address] Phone: [phone]   Fax:  [phone]  Name: Grace Moyer MRN: [id number] Date of Birth: 01/07/1961   

## 2016-02-06 NOTE — Progress Notes (Signed)
Nutrition Assessment  Reason for Assessment:  Pt seen in Breast Clinic  ASSESSMENT:   55 year old female with new diagnosis of breast cancer Past medical history reviewed.  Patient reports good appetite prior to clinic visit  Medications:  Prilosec, Vit D  Labs: reviewed  Anthropometrics:   Height: 63 inches Weight: 202 lb BMI: 35.7   NUTRITION DIAGNOSIS: Food and nutrition related knowledge deficit related to new diagnosis of breast cancer as evidenced by no prior need for nutrition related information.  INTERVENTION:   Discussed and provided packet of information regarding nutritional tips for breast cancer patients.  Questions answered.  Teachback method used.      MONITORING, EVALUATION, and GOAL: Pt will consume a healthy plant based diet to maintain lean body mass throughout treatment.   Mandela Bello B. Zenia Resides, [place], [place] (pager)

## 2016-02-06 NOTE — Progress Notes (Signed)
Clinical Social Work [place] Psychosocial Distress Screening [place]  Patient completed distress screening protocol and scored a 3 on the Psychosocial Distress Thermometer which indicates mild distress. Clinical Social Worker met with patient and patients husband in [facility] to assess for distress and other psychosocial needs. Patient stated she was feeling overwhelmed but felt "better" after meeting with the treatment team and getting more information on her treatment plan. CSW and patient discussed common feeling and emotions when being diagnosed with cancer, and the importance of support during treatment. CSW informed patient of the support team and support services at [facility], and patient was agreeable to an [employer] referral. CSW provided contact information and encouraged patient to call with any questions or concerns.   ONCBCN DISTRESS SCREENING 02/06/2016  Screening Type Initial Screening  Distress experienced in past week (1-10) 3  Emotional problem type Nervousness/Anxiety;Adjusting to illness  Physical Problem type Tingling hands/feet  Physician notified of physical symptoms Yes  Referral to support programs Yes    Johnnye Lana, MSW, LCSW, OSW-C Clinical Social Worker [place] [phone]

## 2016-02-06 NOTE — Patient Instructions (Signed)

## 2016-02-06 NOTE — Progress Notes (Signed)
Radiation Oncology         [phone] ________________________________  Name: Grace Moyer MRN: [id number]  Date: 02/06/2016  DOB: 07/24/1960  UX:NATF,T Jori Moll, MD  Rolm Bookbinder, MD     REFERRING PHYSICIAN: Rolm Bookbinder, MD   DIAGNOSIS: The encounter diagnosis was Malignant neoplasm of upper-outer quadrant of left breast in female, estrogen receptor negative ([place]).   HISTORY OF PRESENT ILLNESS: Grace Moyer is a 55 y.o. female seen in the multidisciplinary breast clinic for a new diagnosis of left sided breast cancer. The patient went for screening mammogram and on 01/18/16 returned for evaluation of a possible breast mass. Diagnostic imaging including mammogram and ultrasound revealed a 2.9 cm spiculated mass. In the upper outer quadrant. Ultrasound measured this as 2.2 x 2.5 x 3.2cm with axillary adenopathy measuring 2.5 cm with a 1.1 cm cortex. A biopsy of both the breast mass and axillary node were performed on 01/29/16 and pathology revealed triple negative (Ki67 85%) grade 3 invasive ductal carcinoma with LVSI. Biopsy of the lymph node confirmed metastatic disease, also ER/HER2 negative with PR receptor of <1%. She comes today to discuss the options for treatment of her cancer.   PREVIOUS RADIATION THERAPY: No   PAST MEDICAL HISTORY:  Past Medical History:  Diagnosis Date  . Anxiety   . Malignant neoplasm of upper-outer quadrant of left female breast ([place]) 01/31/2016  . 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 ABDOMINAL HYSTERECTOMY     partial     FAMILY HISTORY:  Family History  Problem Relation Age of Onset  . Cancer Maternal Grandmother 27  . CAD Paternal Grandmother   . Colon cancer Paternal Uncle      SOCIAL HISTORY:  reports that she quit smoking about 14 years ago. Her smoking use included Cigarettes. She does not have any smokeless tobacco history on file. She reports  that she does not drink alcohol or use drugs. 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   ALLERGIES: Patient has no known allergies.   MEDICATIONS:  Current Outpatient Prescriptions  Medication Sig Dispense Refill  . Cholecalciferol (VITAMIN D-1000 MAX ST) 1000 units tablet Take 2 tablets by mouth daily.    Marland Kitchen gabapentin (NEURONTIN) 100 MG capsule Take 1 capsule by mouth 3 (three) times daily.    Marland Kitchen omeprazole (PRILOSEC) 40 MG capsule Take 1 capsule by mouth daily.    . riTUXimab (RITUXAN) 100 MG/10ML injection Inject into the vein every 4 (four) months.     No current facility-administered medications for this encounter.      REVIEW OF SYSTEMS: On review of systems, the patient reports that she is doing well overall. She denies any chest pain, shortness of breath, cough, fevers, chills, night sweats, unintended weight changes. She denies any bowel or bladder disturbances, and denies abdominal pain, nausea or vomiting. She denies any new musculoskeletal or joint aches or pains. A complete review of systems is obtained and is otherwise negative.     PHYSICAL EXAM:  Wt Readings from Last 3 Encounters:  02/06/16 205 lb 3.2 oz (93.1 kg)  08/14/15 202 lb (91.6 kg)  08/14/15 202 lb (91.6 kg)   Temp Readings from Last 3 Encounters:  02/06/16 98 F (36.7 C) (Oral)  08/17/15 98.4 F (36.9 C) (Oral)   BP Readings from Last 3 Encounters:  02/06/16 (!) 131/49  08/17/15 (!) 102/53   Pulse Readings from Last 3 Encounters:  02/06/16 67  08/16/15 60     Pain scale 0/10 In general this is a well appearing Caucasian female in no acute distress. She is alert and oriented x4 and appropriate throughout the examination. HEENT reveals that the patient is normocephalic, atraumatic. EOMs are intact. PERRLA. Skin is intact without any evidence of gross lesions. Cardiovascular exam reveals a regular rate and rhythm, no  clicks rubs or murmurs are auscultated. Chest is clear to auscultation bilaterally. Lymphatic assessment is performed and does not reveal any adenopathy in the cervical, supraclavicular, or inguinal chains. Bilateral breast exam is performed and reveals post biopsy ecchymosis along the left breast and axillary region. There is some palpable fullness in the left axilla. No masses are noted of the right breast. Neither breast has nipple bleeding or discharge.  Abdomen has active bowel sounds in all quadrants and is intact. The abdomen is soft, non tender, non distended. Lower extremities are negative for pretibial pitting edema, deep calf tenderness, cyanosis or clubbing.   ECOG = 0  0 - Asymptomatic (Fully active, able to carry on all predisease activities without restriction)  1 - Symptomatic but completely ambulatory (Restricted in physically strenuous activity but ambulatory and able to carry out work of a light or sedentary nature. For example, light housework, office work)  2 - Symptomatic, <50% in bed during the day (Ambulatory and capable of all self care but unable to carry out any work activities. Up and about more than 50% of waking hours)  3 - Symptomatic, >50% in bed, but not bedbound (Capable of only limited self-care, confined to bed or chair 50% or more of waking hours)  4 - Bedbound (Completely disabled. Cannot carry on any self-care. Totally confined to bed or chair)  5 - Death   Eustace Pen MM, Creech RH, Tormey DC, et al. [phone]). "Toxicity and response criteria of the [facility]". [place] Oncol. 5 (6): 649-55    LABORATORY DATA:  Lab Results  Component Value Date   WBC 5.7 02/06/2016   HGB 15.0 02/06/2016   HCT 44.4 02/06/2016   MCV 86.3 02/06/2016   PLT 204 02/06/2016   Lab Results  Component Value Date   NA 142 02/06/2016   K 4.4 02/06/2016   CL 106 08/14/2015   CO2 25 02/06/2016   Lab Results  Component Value Date   ALT 35 02/06/2016     AST 25 02/06/2016   ALKPHOS 97 02/06/2016   BILITOT 0.54 02/06/2016      RADIOGRAPHY: US Breast Ltd Uni Left Inc Axilla  Result Date: 01/28/2016 CLINICAL DATA:  Patient was called back from screening mammogram for a left breast mass. EXAM: 2D DIGITAL DIAGNOSTIC LEFT MAMMOGRAM WITH CAD AND ADJUNCT TOMO ULTRASOUND LEFT BREAST COMPARISON:  Previous exam(s). ACR Breast Density Category c: The breast tissue is heterogeneously dense, which may obscure small masses. FINDINGS: Additional imaging of the left breast was performed. There is persistence of an obscured spiculated 2.9 cm mass in the upper-outer quadrant of the breast. There are no malignant type microcalcifications. Enlarged lymph nodes are seen in the axilla. Mammographic images were processed with CAD. On physical exam, I palpate discrete thickening in the left breast at 1:30 8 cm from the nipple. Targeted ultrasound is performed, showing an irregular hypoechoic mass in the left breast at 1:30 8 cm from the nipple measuring 2.2 x 2.5 x 3.2 cm.  Sonographic evaluation of the left axilla shows multiple enlarged lymph nodes. The largest lymph node measures 2.5 cm with a 1.1 cm cortex. IMPRESSION: Imaging findings worrisome for invasive mammary carcinoma in the left breast with axillary metastasis. RECOMMENDATION: Ultrasound-guided core biopsies of the left breast mass and of a axillary lymph node is recommended. I have discussed the findings and recommendations with the patient. Results were also provided in writing at the conclusion of the visit. If applicable, a reminder letter will be sent to the patient regarding the next appointment. BI-RADS CATEGORY  5: Highly suggestive of malignancy. Electronically Signed   By: Lillia Mountain M.D.   On: 01/28/2016 12:10   Mm Diag Breast Tomo Uni Left  Result Date: 01/28/2016 CLINICAL DATA:  Patient was called back from screening mammogram for a left breast mass. EXAM: 2D DIGITAL DIAGNOSTIC LEFT MAMMOGRAM WITH  CAD AND ADJUNCT TOMO ULTRASOUND LEFT BREAST COMPARISON:  Previous exam(s). ACR Breast Density Category c: The breast tissue is heterogeneously dense, which may obscure small masses. FINDINGS: Additional imaging of the left breast was performed. There is persistence of an obscured spiculated 2.9 cm mass in the upper-outer quadrant of the breast. There are no malignant type microcalcifications. Enlarged lymph nodes are seen in the axilla. Mammographic images were processed with CAD. On physical exam, I palpate discrete thickening in the left breast at 1:30 8 cm from the nipple. Targeted ultrasound is performed, showing an irregular hypoechoic mass in the left breast at 1:30 8 cm from the nipple measuring 2.2 x 2.5 x 3.2 cm. Sonographic evaluation of the left axilla shows multiple enlarged lymph nodes. The largest lymph node measures 2.5 cm with a 1.1 cm cortex. IMPRESSION: Imaging findings worrisome for invasive mammary carcinoma in the left breast with axillary metastasis. RECOMMENDATION: Ultrasound-guided core biopsies of the left breast mass and of a axillary lymph node is recommended. I have discussed the findings and recommendations with the patient. Results were also provided in writing at the conclusion of the visit. If applicable, a reminder letter will be sent to the patient regarding the next appointment. BI-RADS CATEGORY  5: Highly suggestive of malignancy. Electronically Signed   By: Lillia Mountain M.D.   On: 01/28/2016 12:10   Mm Clip Placement Left  Result Date: 01/29/2016 CLINICAL DATA:  Status post ultrasound-guided core biopsy of mass in the 130 o'clock location of the left breast and an enlarged left axillary lymph node. EXAM: DIAGNOSTIC LEFT MAMMOGRAM POST ULTRASOUND BIOPSY sense to COMPARISON:  Previous exam(s). FINDINGS: Mammographic images were obtained following ultrasound guided biopsy of mass in the 130 o'clock location of the left breast and placement of a coil shaped clip. Coil shaped clip  is identified in the upper-outer quadrant of the left breast within the mammographically detected mass. Within the left axilla, a spiral shaped clip is present in 1 of the enlarged left axillary lymph nodes. IMPRESSION: Tissue marker clips are in the expected locations after biopsy. Final Assessment: Post Procedure Mammograms for Marker Placement Electronically Signed   By: Nolon Nations M.D.   On: 01/29/2016 10:38   [place] Lt Breast Bx W Loc Dev 1st Lesion Img Bx Spec US Guide  Addendum Date: 01/31/2016   ADDENDUM REPORT: 01/30/2016 14:41 ADDENDUM: Pathology revealed GRADE III INVASIVE DUCTAL CARCINOMA with LYMPHOVASCULAR INVOLVEMENT BY TUMOR of the Left breast at the 1:30 o'clock location. METASTATIC CARCINOMA of the Left axillary lymph node. This was found to be concordant by Dr. Nolon Nations. Pathology results were discussed with the  patient by telephone. The patient reported doing well after the biopsies with tenderness at the sites. Post biopsy instructions and care were reviewed and questions were answered. The patient was encouraged to call [facility] for any additional concerns. The patient was referred to [facility] on February 06, 2016. Pathology results reported by Terie Purser, RN on 01/30/2016. Electronically Signed   By: Nolon Nations M.D.   On: 01/30/2016 14:41   Result Date: 01/31/2016 CLINICAL DATA:  Patient presents for ultrasound-guided core biopsy of mass in the 130 o'clock location of the left breast. EXAM: ULTRASOUND GUIDED LEFT BREAST CORE NEEDLE BIOPSY COMPARISON:  Previous exam(s). FINDINGS: I met with the patient and we discussed the procedure of ultrasound-guided biopsy, including benefits and alternatives. We discussed the high likelihood of a successful procedure. We discussed the risks of the procedure, including infection, bleeding, tissue injury, clip migration, and  inadequate sampling. Informed written consent was given. The usual time-out protocol was performed immediately prior to the procedure. Using sterile technique and 1% Lidocaine as local anesthetic, under direct ultrasound visualization, a 12 gauge spring-loaded device was used to perform biopsy of mass in the 130 o'clock location of the left breast using a lateral approach. At the conclusion of the procedure a coil shaped tissue marker clip was deployed into the biopsy cavity. Follow up 2 view mammogram was performed and dictated separately. IMPRESSION: Ultrasound guided biopsy of left breast mass. No apparent complications. Electronically Signed: By: Nolon Nations M.D. On: 01/29/2016 10:21   [place] Lt Breast Bx W Loc Dev Ea Add Lesion Img Bx Spec US Guide  Result Date: 01/29/2016 CLINICAL DATA:  Patient presents for ultrasound-guided core biopsy of left axillary lymph node. EXAM: ULTRASOUND GUIDED CORE NEEDLE BIOPSY OF A LEFT AXILLARY NODE COMPARISON:  Previous exam(s). FINDINGS: I met with the patient and we discussed the procedure of ultrasound-guided biopsy, including benefits and alternatives. We discussed the high likelihood of a successful procedure. We discussed the risks of the procedure, including infection, bleeding, tissue injury, clip migration, and inadequate sampling. Informed written consent was given. The usual time-out protocol was performed immediately prior to the procedure. Using sterile technique and 1% Lidocaine as local anesthetic, under direct ultrasound visualization, a 14 gauge spring-loaded device was used to perform biopsy of enlarged left axillary lymph node using a lateral approach. At the conclusion of the procedure a spiral shaped HydroMARK tissue marker clip was deployed into the biopsy cavity. Follow up 2 view mammogram was performed and dictated separately. IMPRESSION: Ultrasound guided biopsy of left axillary lymph node. No apparent complications. Electronically Signed   By:  Nolon Nations M.D.   On: 01/29/2016 10:23       IMPRESSION/PLAN: 1. Stage IIB, T2N1M0 triple negative invasive ductal carcinoma of the right breast. Dr. Lisbeth Renshaw discusses the pathology findings and reviews the nature of invasive breast disease. The consensus from the breast conference includes the role of neoadjuvant chemotherapy followed by surgical resesction, followed by radiotherapy. We discussed the risks, benefits, short, and long term effects of radiotherapy, and the patient is interested in proceeding. Dr. Lisbeth Renshaw discusses the delivery and logistics of radiotherapy, and regardless of surgical approach, he would recommend 6 1/2 weeks of radiotherapy to the right breast with a total of 33 fractions. Given the left sided disease, Dr. Lisbeth Renshaw discusses the consideration of positioning and reviews the utility of breath hold to avoid exposure to the heart.  We will see her back about 2 weeks after surgery to move forward with the simulation and planning process and anticipate starting radiotherapy about 4 weeks after surgery.  2. Neuromyelitis optica. The patient's Rituxan will be on hold per Dr. Burr Medico while she receives systemic chemotherapy for her breast cancer. 3. Possible genetic predisposition to malignancy. The patient qualifies for genetic testing and will be referred to discuss testing with our counselors here at the cancer center.  The above documentation reflects my direct findings during this shared patient visit. Please see the separate note by Dr. Lisbeth Renshaw on this date for the remainder of the patient's plan of care.    Carola Rhine, PAC

## 2016-02-07 ENCOUNTER — Telehealth: Payer: Self-pay | Admitting: *Deleted

## 2016-02-07 ENCOUNTER — Telehealth: Payer: Self-pay | Admitting: Hematology

## 2016-02-07 ENCOUNTER — Encounter (HOSPITAL_BASED_OUTPATIENT_CLINIC_OR_DEPARTMENT_OTHER): Payer: Self-pay | Admitting: *Deleted

## 2016-02-07 NOTE — Telephone Encounter (Signed)
Spoke to pt regarding [place] from 02/06/16. Denies questions or concerns regarding dx or treatment careplan. Discussed future appts. Encourage pt to call with needs. Received verbal understanding. Contact information provided.

## 2016-02-07 NOTE — Telephone Encounter (Signed)
Spoke with patient to confirm appointment for echo on 02/08/16 at [place].  I had to reschedule her chemo class due to her echo to 12/12.  Patient is aware of all appointments.

## 2016-02-07 NOTE — Telephone Encounter (Signed)
sw pt to confirm 12/13 appt date/time per LOS °

## 2016-02-08 ENCOUNTER — Ambulatory Visit (HOSPITAL_COMMUNITY)
Admission: RE | Admit: 2016-02-08 | Discharge: 2016-02-08 | Disposition: A | Discharge status: Home / Self Care | Payer: 59 | Source: Ambulatory Visit | Attending: Hematology | Admitting: Hematology

## 2016-02-08 ENCOUNTER — Other Ambulatory Visit: Payer: 59

## 2016-02-08 DIAGNOSIS — C50412 Malignant neoplasm of upper-outer quadrant of left female breast: Secondary | ICD-10-CM | POA: Diagnosis not present

## 2016-02-08 DIAGNOSIS — I071 Rheumatic tricuspid insufficiency: Secondary | ICD-10-CM | POA: Diagnosis not present

## 2016-02-08 DIAGNOSIS — G360 Neuromyelitis optica [Devic]: Secondary | ICD-10-CM | POA: Diagnosis not present

## 2016-02-08 DIAGNOSIS — Z171 Estrogen receptor negative status [ER-]: Secondary | ICD-10-CM | POA: Insufficient documentation

## 2016-02-08 DIAGNOSIS — F419 Anxiety disorder, unspecified: Secondary | ICD-10-CM | POA: Diagnosis not present

## 2016-02-08 DIAGNOSIS — I340 Nonrheumatic mitral (valve) insufficiency: Secondary | ICD-10-CM | POA: Insufficient documentation

## 2016-02-08 NOTE — Progress Notes (Signed)
  Echocardiogram 2D Echocardiogram has been performed.  Grace Moyer 02/08/2016, 9:50 AM

## 2016-02-09 ENCOUNTER — Ambulatory Visit (HOSPITAL_COMMUNITY)
Admission: RE | Admit: 2016-02-09 | Discharge: 2016-02-09 | Disposition: A | Discharge status: Home / Self Care | Payer: 59 | Source: Ambulatory Visit | Attending: Hematology | Admitting: Hematology

## 2016-02-09 DIAGNOSIS — Z171 Estrogen receptor negative status [ER-]: Secondary | ICD-10-CM | POA: Insufficient documentation

## 2016-02-09 DIAGNOSIS — R590 Localized enlarged lymph nodes: Secondary | ICD-10-CM | POA: Insufficient documentation

## 2016-02-09 DIAGNOSIS — C50412 Malignant neoplasm of upper-outer quadrant of left female breast: Secondary | ICD-10-CM | POA: Diagnosis not present

## 2016-02-09 DIAGNOSIS — G360 Neuromyelitis optica [Devic]: Secondary | ICD-10-CM

## 2016-02-09 MED ORDER — GADOBENATE DIMEGLUMINE 529 MG/ML IV SOLN
19.0000 mL | Freq: Once | INTRAVENOUS | Status: AC | PRN
  Administered 2016-02-09: 19 mL via INTRAVENOUS

## 2016-02-11 ENCOUNTER — Other Ambulatory Visit: Payer: 59

## 2016-02-11 ENCOUNTER — Ambulatory Visit (HOSPITAL_BASED_OUTPATIENT_CLINIC_OR_DEPARTMENT_OTHER)
Admission: RE | Admit: 2016-02-11 | Discharge: 2016-02-11 | Disposition: A | Discharge status: Home / Self Care | Payer: 59 | Source: Ambulatory Visit | Attending: General Surgery | Admitting: General Surgery

## 2016-02-11 ENCOUNTER — Ambulatory Visit (HOSPITAL_BASED_OUTPATIENT_CLINIC_OR_DEPARTMENT_OTHER): Payer: 59 | Admitting: Anesthesiology

## 2016-02-11 ENCOUNTER — Encounter (HOSPITAL_BASED_OUTPATIENT_CLINIC_OR_DEPARTMENT_OTHER)
Admission: RE | Disposition: A | Discharge status: Home / Self Care | Payer: Self-pay | Source: Ambulatory Visit | Attending: General Surgery

## 2016-02-11 ENCOUNTER — Ambulatory Visit (HOSPITAL_COMMUNITY): Payer: 59

## 2016-02-11 ENCOUNTER — Encounter (HOSPITAL_BASED_OUTPATIENT_CLINIC_OR_DEPARTMENT_OTHER): Payer: Self-pay | Admitting: *Deleted

## 2016-02-11 DIAGNOSIS — Z87891 Personal history of nicotine dependence: Secondary | ICD-10-CM | POA: Insufficient documentation

## 2016-02-11 DIAGNOSIS — Z8249 Family history of ischemic heart disease and other diseases of the circulatory system: Secondary | ICD-10-CM | POA: Diagnosis not present

## 2016-02-11 DIAGNOSIS — Z90710 Acquired absence of both cervix and uterus: Secondary | ICD-10-CM | POA: Diagnosis not present

## 2016-02-11 DIAGNOSIS — C50412 Malignant neoplasm of upper-outer quadrant of left female breast: Secondary | ICD-10-CM | POA: Insufficient documentation

## 2016-02-11 DIAGNOSIS — Z803 Family history of malignant neoplasm of breast: Secondary | ICD-10-CM | POA: Insufficient documentation

## 2016-02-11 DIAGNOSIS — C801 Malignant (primary) neoplasm, unspecified: Secondary | ICD-10-CM | POA: Diagnosis present

## 2016-02-11 DIAGNOSIS — Z95828 Presence of other vascular implants and grafts: Secondary | ICD-10-CM

## 2016-02-11 DIAGNOSIS — Z833 Family history of diabetes mellitus: Secondary | ICD-10-CM | POA: Diagnosis not present

## 2016-02-11 HISTORY — PX: PORTACATH PLACEMENT: SHX2246

## 2016-02-11 SURGERY — INSERTION, TUNNELED CENTRAL VENOUS DEVICE, WITH PORT
Anesthesia: General | Site: Chest | Laterality: Right

## 2016-02-11 MED ORDER — HEPARIN (PORCINE) IN NACL 2-0.9 UNIT/ML-% IJ SOLN
INTRAMUSCULAR | Status: DC | PRN
  Administered 2016-02-11: 1

## 2016-02-11 MED ORDER — ACETAMINOPHEN 500 MG PO TABS
ORAL_TABLET | ORAL | Status: AC
  Filled 2016-02-11: qty 2

## 2016-02-11 MED ORDER — FENTANYL CITRATE (PF) 100 MCG/2ML IJ SOLN
INTRAMUSCULAR | Status: AC
  Filled 2016-02-11: qty 2

## 2016-02-11 MED ORDER — SCOPOLAMINE 1 MG/3DAYS TD PT72: 1.0000 | MEDICATED_PATCH | Freq: Once | TRANSDERMAL | Status: DC | PRN

## 2016-02-11 MED ORDER — HEPARIN SOD (PORK) LOCK FLUSH 100 UNIT/ML IV SOLN
INTRAVENOUS | Status: DC | PRN
  Administered 2016-02-11: 500 [IU] via INTRAVENOUS

## 2016-02-11 MED ORDER — LIDOCAINE HCL (CARDIAC) 20 MG/ML IV SOLN
INTRAVENOUS | Status: DC | PRN
  Administered 2016-02-11: 50 mg via INTRAVENOUS

## 2016-02-11 MED ORDER — GABAPENTIN 300 MG PO CAPS
ORAL_CAPSULE | ORAL | Status: AC
  Filled 2016-02-11: qty 1

## 2016-02-11 MED ORDER — ONDANSETRON HCL 4 MG/2ML IJ SOLN
INTRAMUSCULAR | Status: DC | PRN
  Administered 2016-02-11: 4 mg via INTRAVENOUS

## 2016-02-11 MED ORDER — CEFAZOLIN SODIUM-DEXTROSE 2-4 GM/100ML-% IV SOLN
2.0000 g | INTRAVENOUS | Status: AC
  Administered 2016-02-11: 2 g via INTRAVENOUS

## 2016-02-11 MED ORDER — BUPIVACAINE HCL (PF) 0.25 % IJ SOLN
INTRAMUSCULAR | Status: DC | PRN
  Administered 2016-02-11: 10 mL

## 2016-02-11 MED ORDER — LACTATED RINGERS IV SOLN
INTRAVENOUS | Status: DC
  Administered 2016-02-11: 10 mL/h via INTRAVENOUS
  Administered 2016-02-11: 13:00:00 via INTRAVENOUS

## 2016-02-11 MED ORDER — LIDOCAINE 2% (20 MG/ML) 5 ML SYRINGE
INTRAMUSCULAR | Status: AC
  Filled 2016-02-11: qty 5

## 2016-02-11 MED ORDER — MIDAZOLAM HCL 2 MG/2ML IJ SOLN
1.0000 mg | INTRAMUSCULAR | Status: DC | PRN
  Administered 2016-02-11: 2 mg via INTRAVENOUS

## 2016-02-11 MED ORDER — PROPOFOL 10 MG/ML IV BOLUS
INTRAVENOUS | Status: AC
  Filled 2016-02-11: qty 20

## 2016-02-11 MED ORDER — HYDROCODONE-ACETAMINOPHEN 10-325 MG PO TABS: 1.0000 | ORAL_TABLET | Freq: Four times a day (QID) | ORAL | 0 refills | Status: DC | PRN

## 2016-02-11 MED ORDER — FENTANYL CITRATE (PF) 100 MCG/2ML IJ SOLN
50.0000 ug | INTRAMUSCULAR | Status: AC | PRN
  Administered 2016-02-11: 50 ug via INTRAVENOUS
  Administered 2016-02-11 (×2): 25 ug via INTRAVENOUS

## 2016-02-11 MED ORDER — CEFAZOLIN SODIUM-DEXTROSE 2-4 GM/100ML-% IV SOLN
INTRAVENOUS | Status: AC
  Filled 2016-02-11: qty 100

## 2016-02-11 MED ORDER — ACETAMINOPHEN 500 MG PO TABS
1000.0000 mg | ORAL_TABLET | ORAL | Status: AC
  Administered 2016-02-11: 1000 mg via ORAL

## 2016-02-11 MED ORDER — PROPOFOL 10 MG/ML IV BOLUS
INTRAVENOUS | Status: DC | PRN
  Administered 2016-02-11: 180 mg via INTRAVENOUS

## 2016-02-11 MED ORDER — HYDROMORPHONE HCL 1 MG/ML IJ SOLN
INTRAMUSCULAR | Status: AC
  Filled 2016-02-11: qty 1

## 2016-02-11 MED ORDER — DEXAMETHASONE SODIUM PHOSPHATE 10 MG/ML IJ SOLN
INTRAMUSCULAR | Status: DC | PRN
  Administered 2016-02-11: 5 mg via INTRAVENOUS

## 2016-02-11 MED ORDER — ONDANSETRON HCL 4 MG/2ML IJ SOLN
INTRAMUSCULAR | Status: AC
  Filled 2016-02-11: qty 2

## 2016-02-11 MED ORDER — HYDROMORPHONE HCL 1 MG/ML IJ SOLN
0.2500 mg | INTRAMUSCULAR | Status: DC | PRN
  Administered 2016-02-11: 0.5 mg via INTRAVENOUS

## 2016-02-11 MED ORDER — MIDAZOLAM HCL 2 MG/2ML IJ SOLN
INTRAMUSCULAR | Status: AC
  Filled 2016-02-11: qty 2

## 2016-02-11 MED ORDER — GABAPENTIN 300 MG PO CAPS
300.0000 mg | ORAL_CAPSULE | ORAL | Status: AC
  Administered 2016-02-11: 300 mg via ORAL

## 2016-02-11 SURGICAL SUPPLY — 49 items
BAG DECANTER FOR FLEXI CONT (MISCELLANEOUS) ×2 IMPLANT
BENZOIN TINCTURE PRP APPL 2/3 (GAUZE/BANDAGES/DRESSINGS) ×2 IMPLANT
BLADE SURG 11 STRL SS (BLADE) ×2 IMPLANT
BLADE SURG 15 STRL LF DISP TIS (BLADE) ×1 IMPLANT
BLADE SURG 15 STRL SS (BLADE) ×1
CANISTER SUCT 1200ML W/VALVE (MISCELLANEOUS) IMPLANT
CHLORAPREP W/TINT 26ML (MISCELLANEOUS) ×2 IMPLANT
COVER BACK TABLE 60X90IN (DRAPES) ×2 IMPLANT
COVER MAYO STAND STRL (DRAPES) ×2 IMPLANT
COVER PROBE 5X48 (MISCELLANEOUS) ×1
DERMABOND ADVANCED (GAUZE/BANDAGES/DRESSINGS) ×1
DERMABOND ADVANCED .7 DNX12 (GAUZE/BANDAGES/DRESSINGS) ×1 IMPLANT
DRAPE C-ARM 42X72 X-RAY (DRAPES) ×2 IMPLANT
DRAPE LAPAROSCOPIC ABDOMINAL (DRAPES) ×2 IMPLANT
DRAPE UTILITY XL STRL (DRAPES) ×2 IMPLANT
ELECT COATED BLADE 2.86 ST (ELECTRODE) ×2 IMPLANT
ELECT REM PT RETURN 9FT ADLT (ELECTROSURGICAL) ×2
ELECTRODE REM PT RTRN 9FT ADLT (ELECTROSURGICAL) ×1 IMPLANT
GLOVE BIO SURGEON STRL SZ7 (GLOVE) ×4 IMPLANT
GLOVE BIOGEL PI IND STRL 7.0 (GLOVE) ×1 IMPLANT
GLOVE BIOGEL PI IND STRL 7.5 (GLOVE) ×3 IMPLANT
GLOVE BIOGEL PI INDICATOR 7.0 (GLOVE) ×1
GLOVE BIOGEL PI INDICATOR 7.5 (GLOVE) ×3
GLOVE SURG SYN 7.5  E (GLOVE) ×1
GLOVE SURG SYN 7.5 E (GLOVE) ×1 IMPLANT
GOWN STRL REUS W/ TWL LRG LVL3 (GOWN DISPOSABLE) ×3 IMPLANT
GOWN STRL REUS W/TWL LRG LVL3 (GOWN DISPOSABLE) ×3
IV KIT MINILOC 20X1 SAFETY (NEEDLE) IMPLANT
KIT CVR 48X5XPRB PLUP LF (MISCELLANEOUS) ×1 IMPLANT
KIT PORT POWER 8FR ISP CVUE (Catheter) ×2 IMPLANT
NDL SAFETY ECLIPSE 18X1.5 (NEEDLE) IMPLANT
NEEDLE HYPO 18GX1.5 SHARP (NEEDLE)
NEEDLE HYPO 25X1 1.5 SAFETY (NEEDLE) ×2 IMPLANT
PACK BASIN DAY SURGERY FS (CUSTOM PROCEDURE TRAY) ×2 IMPLANT
PENCIL BUTTON HOLSTER BLD 10FT (ELECTRODE) ×2 IMPLANT
SET SHEATH INTRODUCER 10FR (MISCELLANEOUS) ×2 IMPLANT
SLEEVE SCD COMPRESS KNEE MED (MISCELLANEOUS) ×2 IMPLANT
SPONGE GAUZE 4X4 12PLY STER LF (GAUZE/BANDAGES/DRESSINGS) ×2 IMPLANT
STRIP CLOSURE SKIN 1/2X4 (GAUZE/BANDAGES/DRESSINGS) ×2 IMPLANT
SUT MON AB 4-0 PC3 18 (SUTURE) ×2 IMPLANT
SUT PROLENE 2 0 SH DA (SUTURE) ×2 IMPLANT
SUT SILK 2 0 TIES 17X18 (SUTURE)
SUT SILK 2-0 18XBRD TIE BLK (SUTURE) IMPLANT
SUT VIC AB 3-0 SH 27 (SUTURE) ×1
SUT VIC AB 3-0 SH 27X BRD (SUTURE) ×1 IMPLANT
SYR 5ML LUER SLIP (SYRINGE) ×2 IMPLANT
SYR CONTROL 10ML LL (SYRINGE) ×2 IMPLANT
TOWEL OR 17X24 6PK STRL BLUE (TOWEL DISPOSABLE) ×2 IMPLANT
TOWEL OR NON WOVEN STRL DISP B (DISPOSABLE) ×2 IMPLANT

## 2016-02-11 NOTE — Transfer of Care (Signed)
Immediate Anesthesia Transfer of Care Note  Patient: Grace Moyer  Procedure(s) Performed: Procedure(s): INSERTION PORT-A-CATH WITH [place] (Right)  Patient Location: PACU  Anesthesia Type:General  Level of Consciousness: awake, alert  and sedated  Airway & Oxygen Therapy: Patient Spontanous Breathing and Patient connected to face mask oxygen  Post-op Assessment: Report given to RN and Post -op Vital signs reviewed and stable  Post vital signs: Reviewed and stable  Last Vitals:  Vitals:   02/11/16 1017  BP: 125/64  Pulse: 60  Resp: 18  Temp: 36.9 C    Last Pain:  Vitals:   02/11/16 1017  TempSrc: Oral         Complications: No apparent anesthesia complications

## 2016-02-11 NOTE — Anesthesia Postprocedure Evaluation (Signed)
Anesthesia Post Note  Patient: Grace Moyer  Procedure(s) Performed: Procedure(s) (LRB): INSERTION PORT-A-CATH WITH [place] (Right)  Patient location during evaluation: PACU Anesthesia Type: General Level of consciousness: awake Pain management: pain level controlled Respiratory status: spontaneous breathing Cardiovascular status: stable Anesthetic complications: no    Last Vitals:  Vitals:   02/11/16 1345 02/11/16 1400  BP: (!) 141/78 132/77  Pulse: (!) 49 (!) 51  Resp: 11 12  Temp:      Last Pain:  Vitals:   02/11/16 1400  TempSrc:   PainSc: 6                  Junia Nygren

## 2016-02-11 NOTE — Anesthesia Procedure Notes (Signed)
Procedure Name: LMA Insertion Date/Time: 02/11/2016 12:14 PM Performed by: Bufford Spikes Pre-anesthesia Checklist: Patient identified, Emergency Drugs available, Suction available and Patient being monitored Patient Re-evaluated:Patient Re-evaluated prior to inductionOxygen Delivery Method: Circle system utilized Preoxygenation: Pre-oxygenation with 100% oxygen Intubation Type: IV induction Ventilation: Mask ventilation without difficulty LMA: LMA inserted LMA Size: 4.0 Tube type: Oral Number of attempts: 1 Airway Equipment and Method: Bite block Placement Confirmation: positive ETCO2 Tube secured with: Tape Dental Injury: Teeth and Oropharynx as per pre-operative assessment

## 2016-02-11 NOTE — H&P (Signed)
55 yof referred by Dr Dory Horn presents with newly diagnosed left breast cancer. she has neuromyelitis optica treated with rituximab at [facility] by neurology. this was only diagnosed recently. she has fh in mgm of breast cancer. she had mass and underwent evaluation. she has density c breasts. on mm had a left uoq mass and abnormal nodes. on [place] the mass is 3.5x2.5x2.2 cm present and multiple abnormal lymph nodes. the biopsy of the node is positive. the breast is a grade III tn idc that the Ki of 85%. she has no prior breast history. she has no discharge present. she lives in [place], youngest daughter is 39. she is here with her husband today   Other Problems Tawni Pummel, RN; 02/06/2016 7:57 AM) Bladder Problems  Breast Cancer  Hemorrhoids  Lump In Breast   Past Surgical History Tawni Pummel, RN; 02/06/2016 7:57 AM) Breast Biopsy  Left. Hemorrhoidectomy  Hysterectomy (not due to cancer) - Partial  Oral Surgery   Diagnostic Studies History Tawni Pummel, RN; 02/06/2016 7:57 AM) Colonoscopy  1-5 years ago Mammogram  within last year Pap Smear  1-5 years ago  Medication History Tawni Pummel, RN; 02/06/2016 7:57 AM) Medications Reconciled  Social History Tawni Pummel, RN; 02/06/2016 7:57 AM) Alcohol use  Occasional alcohol use. Caffeine use  Carbonated beverages, Coffee. No drug use  Tobacco use  Former smoker.  Family History Tawni Pummel, RN; 02/06/2016 7:57 AM) Anesthetic complications  Sister. Breast Cancer  Family Members In General. Diabetes Mellitus  Father. Heart Disease  Family Members In General. Heart disease in female family member before age 65  Hypertension  Father.  Pregnancy / Birth History Tawni Pummel, RN; 02/06/2016 7:57 AM) Age at menarche  55 years. Age of menopause  60-50 Contraceptive History  Oral contraceptives. Gravida  1 Irregular periods  Maternal age  28-25 Para  1  Review of Systems Tawni Pummel RN; 02/06/2016 7:57 AM) General Not Present- Appetite Loss, Chills, Fatigue, Fever, Night Sweats, Weight Gain and Weight Loss. Skin Not Present- Change in Wart/Mole, Dryness, Hives, Jaundice, New Lesions, Non-Healing Wounds, Rash and Ulcer. HEENT Present- Wears glasses/contact lenses. Not Present- Earache, Hearing Loss, Hoarseness, Nose Bleed, Oral Ulcers, Ringing in the Ears, Seasonal Allergies, Sinus Pain, Sore Throat, Visual Disturbances and Yellow Eyes. Respiratory Present- Snoring. Not Present- Bloody sputum, Chronic Cough, Difficulty Breathing and Wheezing. Breast Present- Breast Mass. Not Present- Breast Pain, Nipple Discharge and Skin Changes. Cardiovascular Not Present- Chest Pain, Difficulty Breathing Lying Down, Leg Cramps, Palpitations, Rapid Heart Rate, Shortness of Breath and Swelling of Extremities. Gastrointestinal Not Present- Abdominal Pain, Bloating, Bloody Stool, Change in Bowel Habits, Chronic diarrhea, Constipation, Difficulty Swallowing, Excessive gas, Gets full quickly at meals, Hemorrhoids, Indigestion, Nausea, Rectal Pain and Vomiting. Female Genitourinary Not Present- Frequency, Nocturia, Painful Urination, Pelvic Pain and Urgency. Musculoskeletal Not Present- Back Pain, Joint Pain, Joint Stiffness, Muscle Pain, Muscle Weakness and Swelling of Extremities. Neurological Present- Numbness and Tingling. Not Present- Decreased Memory, Fainting, Headaches, Seizures, Tremor, Trouble walking and Weakness. Psychiatric Not Present- Anxiety, Bipolar, Change in Sleep Pattern, Depression, Fearful and Frequent crying. Endocrine Not Present- Cold Intolerance, Excessive Hunger, Hair Changes, Heat Intolerance, Hot flashes and New Diabetes. Hematology Present- Easy Bruising. Not Present- Blood Thinners, Excessive bleeding, Gland problems, HIV and Persistent Infections.   Physical Exam Rolm Bookbinder MD; 02/06/2016 2:35 PM) General Mental Status-Alert. Orientation-Oriented  X3.  Chest and Lung Exam Chest and lung exam reveals -on auscultation, normal breath sounds, no adventitious sounds and normal vocal resonance.  Breast Nipples-No Discharge.   Cardiovascular Cardiovascular examination reveals -normal heart sounds, regular rate and rhythm with no murmurs.  Lymphatic Head & Neck  General Head & Neck Lymphatics: Bilateral - Description - Normal. Note: enlarged right axillary nodes, no [place] adenopathy   Assessment & Plan Rolm Bookbinder MD; 02/06/2016 4:12 PM) BREAST CANCER OF UPPER-OUTER QUADRANT OF LEFT FEMALE BREAST (C50.412) Story: Primary systemic therapy, mri, genetics, port placement, staging scans We discussed the staging and pathophysiology of breast cancer. We discussed all of the different options for treatment for breast cancer including surgery, chemotherapy, radiation therapy, Herceptin, and antiestrogen therapy. We discussed she has locally advanced disease and that primary systemic therapy might be beneficial in reducing extent of surgery she will need. we discussed rationale for mri to follow her. we did discuss options for breast surgery after chemotherapy including lumpectomy vs mastectomy. genetics and response to therapy will determine that. we will get mri at end of therapy and I will see her then to determine surgery. we will also then decide options for nodal surgery. I discussed port placement today with her in right ij as well as risks/benefits of that. will proceed early next week with port placement.

## 2016-02-11 NOTE — Discharge Instructions (Signed)

## 2016-02-11 NOTE — Interval H&P Note (Signed)
History and Physical Interval Note:  02/11/2016 11:49 AM  Grace Moyer  has presented today for surgery, with the diagnosis of BREAST CANCER  The various methods of treatment have been discussed with the patient and family. After consideration of risks, benefits and other options for treatment, the patient has consented to  Procedure(s): INSERTION PORT-A-CATH WITH [place] (N/A) as a surgical intervention .  The patient's history has been reviewed, patient examined, no change in status, stable for surgery.  I have reviewed the patient's chart and labs.  Questions were answered to the patient's satisfaction.     Nikholas Geffre

## 2016-02-11 NOTE — Op Note (Signed)
Preoperative diagnosis: breast cancer need for venous access Postoperative diagnosis: same as above Procedure: right ij US guided powerport insertion Surgeon: Dr Serita Grammes EBL: minimal Anes: general  Specimens none Complications none Drains none Sponge count correct Dispo to pacu stable  Indications: This is a 46 yof with newly diagnosed breast cancer. She is due to begin primary systemic chemotherapy. We discussed port placement.   Procedure: After informed consent was obtained the patient was taken to the operating room. She was given antibiotics. Sequential compression devices were on her legs. She was then placed under general anesthesia with an LMA. Then she was prepped and draped in the standard sterile surgical fashion. Surgical timeout was then performed.  I used the ultrasound to identify the right internal jugular vein. I then accessed the vein using the ultrasound.t This aspirated blood. I then placed the wire.this met resistance several times despite being in the vein.  I even accessed the left subclavian once on the first pass but was unable to pass wire either. I used a new glidewire and reaccessed the left IJ without difficulty. This wire passed but went up into the left neck. I then pulled this back and eventually redirected this into the cava. This was confirmed by fluoroscopy and ultrasound to be in the correct position. I created a pocket on the right chest.. I tunneled the line between the 2 sites. I then dilated the tract and placed the dilator assembly with the sheath. This was done under fluoroscopy. I then removed the sheath and dilator. The wire was also removed. The line was then pulled back to be in the venacava. I hooked this up to the port. I sutured this into place with 2-0 Prolene in 2 places. This aspirated blood and flushed easily.This was confirmed with a final fluoroscopy. I placed the access needle and this aspirated blood. I then closed this  with 2-0 Vicryl and 4-0 Monocryl. Dermabond was placed on both the incisions.A dressing was placed. She tolerated this well and was transferred to the recovery room in stable condition

## 2016-02-11 NOTE — Anesthesia Preprocedure Evaluation (Addendum)
Anesthesia Evaluation  Patient identified by MRN, date of birth, ID band Patient awake    Reviewed: Allergy & Precautions, NPO status , Patient's Chart, lab work & pertinent test results  Airway Mallampati: II  TM Distance: >3 FB     Dental   Pulmonary neg pulmonary ROS, former smoker,    breath sounds clear to auscultation       Cardiovascular negative cardio ROS   Rhythm:Regular Rate:Normal     Neuro/Psych    GI/Hepatic negative GI ROS, Neg liver ROS,   Endo/Other  negative endocrine ROS  Renal/GU negative Renal ROS     Musculoskeletal   Abdominal   Peds  Hematology   Anesthesia Other Findings   Reproductive/Obstetrics                            Anesthesia Physical Anesthesia Plan  ASA: III  Anesthesia Plan: General   Post-op Pain Management:    Induction: Intravenous  Airway Management Planned: LMA  Additional Equipment:   Intra-op Plan:   Post-operative Plan: Extubation in OR  Informed Consent: I have reviewed the patients History and Physical, chart, labs and discussed the procedure including the risks, benefits and alternatives for the proposed anesthesia with the patient or authorized representative who has indicated his/her understanding and acceptance.   Dental advisory given  Plan Discussed with: CRNA and Anesthesiologist  Anesthesia Plan Comments:         Anesthesia Quick Evaluation

## 2016-02-12 ENCOUNTER — Other Ambulatory Visit: Payer: 59

## 2016-02-12 ENCOUNTER — Encounter: Payer: Self-pay | Admitting: *Deleted

## 2016-02-12 ENCOUNTER — Encounter (HOSPITAL_COMMUNITY)
Admission: RE | Admit: 2016-02-12 | Discharge: 2016-02-12 | Disposition: A | Discharge status: Home / Self Care | Payer: 59 | Source: Ambulatory Visit | Attending: Hematology | Admitting: Hematology

## 2016-02-12 ENCOUNTER — Encounter (HOSPITAL_BASED_OUTPATIENT_CLINIC_OR_DEPARTMENT_OTHER): Payer: Self-pay | Admitting: General Surgery

## 2016-02-12 ENCOUNTER — Ambulatory Visit (HOSPITAL_COMMUNITY)
Admission: RE | Admit: 2016-02-12 | Discharge: 2016-02-12 | Disposition: A | Discharge status: Home / Self Care | Payer: 59 | Source: Ambulatory Visit | Attending: Hematology | Admitting: Hematology

## 2016-02-12 ENCOUNTER — Other Ambulatory Visit: Payer: Self-pay | Admitting: *Deleted

## 2016-02-12 DIAGNOSIS — C50412 Malignant neoplasm of upper-outer quadrant of left female breast: Secondary | ICD-10-CM

## 2016-02-12 DIAGNOSIS — G360 Neuromyelitis optica [Devic]: Secondary | ICD-10-CM

## 2016-02-12 DIAGNOSIS — Z171 Estrogen receptor negative status [ER-]: Secondary | ICD-10-CM | POA: Insufficient documentation

## 2016-02-12 DIAGNOSIS — C773 Secondary and unspecified malignant neoplasm of axilla and upper limb lymph nodes: Secondary | ICD-10-CM | POA: Insufficient documentation

## 2016-02-12 DIAGNOSIS — R590 Localized enlarged lymph nodes: Secondary | ICD-10-CM | POA: Diagnosis present

## 2016-02-12 MED ORDER — IOPAMIDOL (ISOVUE-300) INJECTION 61%
100.0000 mL | Freq: Once | INTRAVENOUS | Status: AC | PRN
  Administered 2016-02-12: 100 mL via INTRAVENOUS

## 2016-02-12 MED ORDER — TECHNETIUM TC 99M MEDRONATE IV KIT
18.9000 | PACK | Freq: Once | INTRAVENOUS | Status: AC | PRN
  Administered 2016-02-12: 18.9 via INTRAVENOUS

## 2016-02-12 MED ORDER — IOPAMIDOL (ISOVUE-300) INJECTION 61%
INTRAVENOUS | Status: AC
  Filled 2016-02-12: qty 100

## 2016-02-12 MED ORDER — SODIUM CHLORIDE 0.9 % IJ SOLN
INTRAMUSCULAR | Status: AC
  Filled 2016-02-12: qty 50

## 2016-02-12 NOTE — Addendum Note (Signed)
Addendum  created 02/12/16 [id number] by Tawni Millers, CRNA   Charge Capture section accepted

## 2016-02-13 ENCOUNTER — Ambulatory Visit (HOSPITAL_BASED_OUTPATIENT_CLINIC_OR_DEPARTMENT_OTHER): Payer: 59

## 2016-02-13 ENCOUNTER — Ambulatory Visit (HOSPITAL_BASED_OUTPATIENT_CLINIC_OR_DEPARTMENT_OTHER): Payer: 59 | Admitting: Hematology

## 2016-02-13 ENCOUNTER — Telehealth: Payer: Self-pay | Admitting: Hematology

## 2016-02-13 ENCOUNTER — Encounter: Payer: Self-pay | Admitting: Hematology

## 2016-02-13 ENCOUNTER — Other Ambulatory Visit: Payer: 59

## 2016-02-13 ENCOUNTER — Other Ambulatory Visit (HOSPITAL_BASED_OUTPATIENT_CLINIC_OR_DEPARTMENT_OTHER): Payer: 59

## 2016-02-13 ENCOUNTER — Ambulatory Visit: Payer: 59 | Admitting: Hematology

## 2016-02-13 ENCOUNTER — Encounter: Payer: Self-pay | Admitting: *Deleted

## 2016-02-13 VITALS — BP 122/60 | HR 67 | Temp 98.2°F | Resp 16 | Ht 63.0 in | Wt 205.3 lb

## 2016-02-13 DIAGNOSIS — G360 Neuromyelitis optica [Devic]: Secondary | ICD-10-CM

## 2016-02-13 DIAGNOSIS — Z171 Estrogen receptor negative status [ER-]: Secondary | ICD-10-CM | POA: Diagnosis not present

## 2016-02-13 DIAGNOSIS — C50412 Malignant neoplasm of upper-outer quadrant of left female breast: Secondary | ICD-10-CM | POA: Diagnosis not present

## 2016-02-13 DIAGNOSIS — C773 Secondary and unspecified malignant neoplasm of axilla and upper limb lymph nodes: Secondary | ICD-10-CM

## 2016-02-13 DIAGNOSIS — Z5111 Encounter for antineoplastic chemotherapy: Secondary | ICD-10-CM | POA: Diagnosis not present

## 2016-02-13 LAB — CBC WITH DIFFERENTIAL/PLATELET
BASO%: 1.2 % (ref 0.0–2.0)
Basophils Absolute: 0.1 10*3/uL (ref 0.0–0.1)
EOS%: 1.1 % (ref 0.0–7.0)
Eosinophils Absolute: 0.1 10*3/uL (ref 0.0–0.5)
HCT: 41.4 % (ref 34.8–46.6)
HGB: 13.8 g/dL (ref 11.6–15.9)
LYMPH%: 23.7 % (ref 14.0–49.7)
MCH: 29 pg (ref 25.1–34.0)
MCHC: 33.4 g/dL (ref 31.5–36.0)
MCV: 86.8 fL (ref 79.5–101.0)
MONO#: 0.4 10*3/uL (ref 0.1–0.9)
MONO%: 6.8 % (ref 0.0–14.0)
NEUT#: 4.5 10*3/uL (ref 1.5–6.5)
NEUT%: 67.2 % (ref 38.4–76.8)
Platelets: 217 10*3/uL (ref 145–400)
RBC: 4.77 10*6/uL (ref 3.70–5.45)
RDW: 13 % (ref 11.2–14.5)
WBC: 6.6 10*3/uL (ref 3.9–10.3)
lymph#: 1.6 10*3/uL (ref 0.9–3.3)

## 2016-02-13 LAB — COMPREHENSIVE METABOLIC PANEL
ALT: 22 U/L (ref 0–55)
AST: 17 U/L (ref 5–34)
Albumin: 3.8 g/dL (ref 3.5–5.0)
Alkaline Phosphatase: 82 U/L (ref 40–150)
Anion Gap: 9 mEq/L (ref 3–11)
BUN: 11.9 mg/dL (ref 7.0–26.0)
CO2: 28 mEq/L (ref 22–29)
Calcium: 9.3 mg/dL (ref 8.4–10.4)
Chloride: 106 mEq/L (ref 98–109)
Creatinine: 0.8 mg/dL (ref 0.6–1.1)
EGFR: 83 mL/min/{1.73_m2} — ABNORMAL LOW (ref >90)
Glucose: 86 mg/dl (ref 70–140)
Potassium: 3.9 mEq/L (ref 3.5–5.1)
Sodium: 143 mEq/L (ref 136–145)
Total Bilirubin: 0.43 mg/dL (ref 0.20–1.20)
Total Protein: 6.8 g/dL (ref 6.4–8.3)

## 2016-02-13 MED ORDER — LIDOCAINE-PRILOCAINE 2.5-2.5 % EX CREA: TOPICAL_CREAM | CUTANEOUS | 3 refills | Status: DC

## 2016-02-13 MED ORDER — SODIUM CHLORIDE 0.9 % IV SOLN
Freq: Once | INTRAVENOUS | Status: AC
Start: 2016-02-13 — End: 2016-02-13
  Administered 2016-02-13: 14:00:00 via INTRAVENOUS

## 2016-02-13 MED ORDER — PALONOSETRON HCL INJECTION 0.25 MG/5ML
INTRAVENOUS | Status: AC
  Filled 2016-02-13: qty 5

## 2016-02-13 MED ORDER — ONDANSETRON HCL 8 MG PO TABS: 8.0000 mg | ORAL_TABLET | Freq: Two times a day (BID) | ORAL | 1 refills | Status: DC | PRN

## 2016-02-13 MED ORDER — PEGFILGRASTIM 6 MG/0.6ML ~~LOC~~ PSKT
6.0000 mg | PREFILLED_SYRINGE | Freq: Once | SUBCUTANEOUS | Status: AC
  Administered 2016-02-13: 6 mg via SUBCUTANEOUS
  Filled 2016-02-13: qty 0.6

## 2016-02-13 MED ORDER — SODIUM CHLORIDE 0.9 % IV SOLN
Freq: Once | INTRAVENOUS | Status: AC
  Administered 2016-02-13: 14:00:00 via INTRAVENOUS
  Filled 2016-02-13: qty 5

## 2016-02-13 MED ORDER — DOXORUBICIN HCL CHEMO IV INJECTION 2 MG/ML
60.0000 mg/m2 | Freq: Once | INTRAVENOUS | Status: AC
  Administered 2016-02-13: 122 mg via INTRAVENOUS
  Filled 2016-02-13: qty 61

## 2016-02-13 MED ORDER — PROCHLORPERAZINE MALEATE 10 MG PO TABS: 10.0000 mg | ORAL_TABLET | Freq: Four times a day (QID) | ORAL | 1 refills | Status: DC | PRN

## 2016-02-13 MED ORDER — PALONOSETRON HCL INJECTION 0.25 MG/5ML
0.2500 mg | Freq: Once | INTRAVENOUS | Status: AC
  Administered 2016-02-13: 0.25 mg via INTRAVENOUS

## 2016-02-13 MED ORDER — HEPARIN SOD (PORK) LOCK FLUSH 100 UNIT/ML IV SOLN
500.0000 [IU] | Freq: Once | INTRAVENOUS | Status: AC | PRN
  Administered 2016-02-13: 500 [IU]
  Filled 2016-02-13: qty 5

## 2016-02-13 MED ORDER — LIDOCAINE-PRILOCAINE 2.5-2.5 % EX CREA
TOPICAL_CREAM | CUTANEOUS | Status: AC
  Filled 2016-02-13: qty 5

## 2016-02-13 MED ORDER — SODIUM CHLORIDE 0.9 % IV SOLN
600.0000 mg/m2 | Freq: Once | INTRAVENOUS | Status: AC
  Administered 2016-02-13: 1220 mg via INTRAVENOUS
  Filled 2016-02-13: qty 61

## 2016-02-13 MED ORDER — SODIUM CHLORIDE 0.9% FLUSH
10.0000 mL | INTRAVENOUS | Status: DC | PRN
  Administered 2016-02-13: 10 mL
  Filled 2016-02-13: qty 10

## 2016-02-13 NOTE — Patient Instructions (Addendum)
[facility] Discharge Instructions for Patients Receiving Chemotherapy  Today you received the following chemotherapy agents: Adriamycin and Cytoxan   To help prevent nausea and vomiting after your treatment, we encourage you to take your nausea medication as directed.  Do not take Zofran for three days, on 02/16/16 may start taking Zofran 8 mg twice daily as needed for nausea and Vomiting. Starting today (02/13/16) may take compazine 10 mg every 6 hours as needed for nausea and vomiting.    If you develop nausea and vomiting that is not controlled by your nausea medication, call the clinic.   BELOW ARE SYMPTOMS THAT SHOULD BE REPORTED IMMEDIATELY:  *FEVER GREATER THAN 100.5 F  *CHILLS WITH OR WITHOUT FEVER  NAUSEA AND VOMITING THAT IS NOT CONTROLLED WITH YOUR NAUSEA MEDICATION  *UNUSUAL SHORTNESS OF BREATH  *UNUSUAL BRUISING OR BLEEDING  TENDERNESS IN MOUTH AND THROAT WITH OR WITHOUT PRESENCE OF ULCERS  *URINARY PROBLEMS  *BOWEL PROBLEMS  UNUSUAL RASH Items with * indicate a potential emergency and should be followed up as soon as possible.  Feel free to call the clinic you have any questions or concerns. The clinic phone number is [phone].  Please show the [place] at check-in to the Emergency Department and triage nurse.    Doxorubicin injection What is this medicine? DOXORUBICIN (dox oh ROO bi sin) is a chemotherapy drug. It is used to treat many kinds of cancer like leukemia, lymphoma, neuroblastoma, sarcoma, and Wilms' tumor. It is also used to treat bladder cancer, breast cancer, lung cancer, ovarian cancer, stomach cancer, and thyroid cancer. This medicine may be used for other purposes; ask your health care provider or pharmacist if you have questions. COMMON BRAND NAME(S): Adriamycin, Adriamycin PFS, Adriamycin RDF, Rubex What should I tell my health care provider before I take this medicine? They need to know if you have any of these  conditions: -heart disease -history of low blood counts caused by a medicine -liver disease -recent or ongoing radiation therapy -an unusual or allergic reaction to doxorubicin, other chemotherapy agents, other medicines, foods, dyes, or preservatives -pregnant or trying to get pregnant -breast-feeding How should I use this medicine? This drug is given as an infusion into a vein. It is administered in a hospital or clinic by a specially trained health care professional. If you have pain, swelling, burning or any unusual feeling around the site of your injection, tell your health care professional right away. Talk to your pediatrician regarding the use of this medicine in children. Special care may be needed. Overdosage: If you think you have taken too much of this medicine contact a poison control center or emergency room at once. NOTE: This medicine is only for you. Do not share this medicine with others. What if I miss a dose? It is important not to miss your dose. Call your doctor or health care professional if you are unable to keep an appointment. What may interact with this medicine? This medicine may interact with the following medications: -6-mercaptopurine -paclitaxel -phenytoin -St. John's Wort -trastuzumab -verapamil This list may not describe all possible interactions. Give your health care provider a list of all the medicines, herbs, non-prescription drugs, or dietary supplements you use. Also tell them if you smoke, drink alcohol, or use illegal drugs. Some items may interact with your medicine. What should I watch for while using this medicine? This drug may make you feel generally unwell. This is not uncommon, as chemotherapy can affect healthy cells  as well as cancer cells. Report any side effects. Continue your course of treatment even though you feel ill unless your doctor tells you to stop. There is a maximum amount of this medicine you should receive throughout your  life. The amount depends on the medical condition being treated and your overall health. Your doctor will watch how much of this medicine you receive in your lifetime. Tell your doctor if you have taken this medicine before. You may need blood work done while you are taking this medicine. Your urine may turn red for a few days after your dose. This is not blood. If your urine is dark or brown, call your doctor. In some cases, you may be given additional medicines to help with side effects. Follow all directions for their use. Call your doctor or health care professional for advice if you get a fever, chills or sore throat, or other symptoms of a cold or flu. Do not treat yourself. This drug decreases your body's ability to fight infections. Try to avoid being around people who are sick. This medicine may increase your risk to bruise or bleed. Call your doctor or health care professional if you notice any unusual bleeding. Talk to your doctor about your risk of cancer. You may be more at risk for certain types of cancers if you take this medicine. Do not become pregnant while taking this medicine or for 6 months after stopping it. Women should inform their doctor if they wish to become pregnant or think they might be pregnant. Men should not father a child while taking this medicine and for 6 months after stopping it. There is a potential for serious side effects to an unborn child. Talk to your health care professional or pharmacist for more information. Do not breast-feed an infant while taking this medicine. This medicine has caused ovarian failure in some women and reduced sperm counts in some men This medicine may interfere with the ability to have a child. Talk with your doctor or health care professional if you are concerned about your fertility. What side effects may I notice from receiving this medicine? Side effects that you should report to your doctor or health care professional as soon as  possible: -allergic reactions like skin rash, itching or hives, swelling of the face, lips, or tongue -breathing problems -chest pain -fast or irregular heartbeat -low blood counts - this medicine may decrease the number of white blood cells, red blood cells and platelets. You may be at increased risk for infections and bleeding. -pain, redness, or irritation at site where injected -signs of infection - fever or chills, cough, sore throat, pain or difficulty passing urine -signs of decreased platelets or bleeding - bruising, pinpoint red spots on the skin, black, tarry stools, blood in the urine -swelling of the ankles, feet, hands -tiredness -weakness Side effects that usually do not require medical attention (report to your doctor or health care professional if they continue or are bothersome): -diarrhea -hair loss -mouth sores -nail discoloration or damage -nausea -red colored urine -vomiting This list may not describe all possible side effects. Call your doctor for medical advice about side effects. You may report side effects to FDA at 1-800-FDA-1088. Where should I keep my medicine? This drug is given in a hospital or clinic and will not be stored at home. NOTE: This sheet is a summary. It may not cover all possible information. If you have questions about this medicine, talk to your doctor, pharmacist, or health  care provider.  2017 Elsevier/Gold Standard (2015-04-16 11:28:51)    Cyclophosphamide injection What is this medicine? CYCLOPHOSPHAMIDE (sye kloe FOSS fa mide) is a chemotherapy drug. It slows the growth of cancer cells. This medicine is used to treat many types of cancer like lymphoma, myeloma, leukemia, breast cancer, and ovarian cancer, to name a few. This medicine may be used for other purposes; ask your health care provider or pharmacist if you have questions. COMMON BRAND NAME(S): Cytoxan, Neosar What should I tell my health care provider before I take this  medicine? They need to know if you have any of these conditions: -blood disorders -history of other chemotherapy -infection -kidney disease -liver disease -recent or ongoing radiation therapy -tumors in the bone marrow -an unusual or allergic reaction to cyclophosphamide, other chemotherapy, other medicines, foods, dyes, or preservatives -pregnant or trying to get pregnant -breast-feeding How should I use this medicine? This drug is usually given as an injection into a vein or muscle or by infusion into a vein. It is administered in a hospital or clinic by a specially trained health care professional. Talk to your pediatrician regarding the use of this medicine in children. Special care may be needed. Overdosage: If you think you have taken too much of this medicine contact a poison control center or emergency room at once. NOTE: This medicine is only for you. Do not share this medicine with others. What if I miss a dose? It is important not to miss your dose. Call your doctor or health care professional if you are unable to keep an appointment. What may interact with this medicine? This medicine may interact with the following medications: -amiodarone -amphotericin B -azathioprine -certain antiviral medicines for HIV or AIDS such as protease inhibitors (e.g., indinavir, ritonavir) and zidovudine -certain blood pressure medications such as benazepril, captopril, enalapril, fosinopril, lisinopril, moexipril, monopril, perindopril, quinapril, ramipril, trandolapril -certain cancer medications such as anthracyclines (e.g., daunorubicin, doxorubicin), busulfan, cytarabine, paclitaxel, pentostatin, tamoxifen, trastuzumab -certain diuretics such as chlorothiazide, chlorthalidone, hydrochlorothiazide, indapamide, metolazone -certain medicines that treat or prevent blood clots like warfarin -certain muscle relaxants such as succinylcholine -cyclosporine -etanercept -indomethacin -medicines  to increase blood counts like filgrastim, pegfilgrastim, sargramostim -medicines used as general anesthesia -metronidazole -natalizumab This list may not describe all possible interactions. Give your health care provider a list of all the medicines, herbs, non-prescription drugs, or dietary supplements you use. Also tell them if you smoke, drink alcohol, or use illegal drugs. Some items may interact with your medicine. What should I watch for while using this medicine? Visit your doctor for checks on your progress. This drug may make you feel generally unwell. This is not uncommon, as chemotherapy can affect healthy cells as well as cancer cells. Report any side effects. Continue your course of treatment even though you feel ill unless your doctor tells you to stop. Drink water or other fluids as directed. Urinate often, even at night. In some cases, you may be given additional medicines to help with side effects. Follow all directions for their use. Call your doctor or health care professional for advice if you get a fever, chills or sore throat, or other symptoms of a cold or flu. Do not treat yourself. This drug decreases your body's ability to fight infections. Try to avoid being around people who are sick. This medicine may increase your risk to bruise or bleed. Call your doctor or health care professional if you notice any unusual bleeding. Be careful brushing and flossing your teeth or  using a toothpick because you may get an infection or bleed more easily. If you have any dental work done, tell your dentist you are receiving this medicine. You may get drowsy or dizzy. Do not drive, use machinery, or do anything that needs mental alertness until you know how this medicine affects you. Do not become pregnant while taking this medicine or for 1 year after stopping it. Women should inform their doctor if they wish to become pregnant or think they might be pregnant. Men should not father a child  while taking this medicine and for 4 months after stopping it. There is a potential for serious side effects to an unborn child. Talk to your health care professional or pharmacist for more information. Do not breast-feed an infant while taking this medicine. This medicine may interfere with the ability to have a child. This medicine has caused ovarian failure in some women. This medicine has caused reduced sperm counts in some men. You should talk with your doctor or health care professional if you are concerned about your fertility. If you are going to have surgery, tell your doctor or health care professional that you have taken this medicine. What side effects may I notice from receiving this medicine? Side effects that you should report to your doctor or health care professional as soon as possible: -allergic reactions like skin rash, itching or hives, swelling of the face, lips, or tongue -low blood counts - this medicine may decrease the number of white blood cells, red blood cells and platelets. You may be at increased risk for infections and bleeding. -signs of infection - fever or chills, cough, sore throat, pain or difficulty passing urine -signs of decreased platelets or bleeding - bruising, pinpoint red spots on the skin, black, tarry stools, blood in the urine -signs of decreased red blood cells - unusually weak or tired, fainting spells, lightheadedness -breathing problems -dark urine -dizziness -palpitations -swelling of the ankles, feet, hands -trouble passing urine or change in the amount of urine -weight gain -yellowing of the eyes or skin Side effects that usually do not require medical attention (report to your doctor or health care professional if they continue or are bothersome): -changes in nail or skin color -hair loss -missed menstrual periods -mouth sores -nausea, vomiting This list may not describe all possible side effects. Call your doctor for medical advice  about side effects. You may report side effects to FDA at 1-800-FDA-1088. Where should I keep my medicine? This drug is given in a hospital or clinic and will not be stored at home. NOTE: This sheet is a summary. It may not cover all possible information. If you have questions about this medicine, talk to your doctor, pharmacist, or health care provider.  2017 [employer]vier/Gold Standard (2012-01-02 16:22:58)

## 2016-02-13 NOTE — Progress Notes (Signed)
[place]  [phone] [phone]  Clinic Follow up Note   Patient Care Team: Maisie Fus, MD as PCP - General (Obstetrics and Gynecology) Collene Gobble, MD as Attending Physician (Psychiatry) Rolm Bookbinder, MD as Consulting Physician (General Surgery) Truitt Merle, MD as Consulting Physician (Hematology) Kyung Rudd, MD as Consulting Physician (Radiation Oncology) 02/13/2016  CHIEF COMPLAINTS:  Follow up left breast triple negative cancer  Oncology History   Malignant neoplasm of upper-outer quadrant of left female breast [facility])   Staging form: Breast, AJCC 7th Edition   - Clinical stage from 01/29/2016: Stage IIB (T2, N1, M0) - Signed by Truitt Merle, MD on 02/06/2016      Malignant neoplasm of upper-outer quadrant of left female breast ([place])   01/28/2016 Mammogram    Diagnostic MM and US showed a 2.9cm (3.2X2.2X2.5cm on [place]) mass in [place], multiple enlarged left axillary nodes, largest 2.5cm.       01/29/2016 Initial Diagnosis    Malignant neoplasm of upper-outer quadrant of left female breast ([place])      01/29/2016 Initial Biopsy    Left breast mass and axillary node biopsy showed IDC, G3,       01/29/2016 Receptors her2    ER-, PR-, HER2-, Ki67 85%      02/11/2016 Imaging    Bilateral breast MRI with and without contrast showed a 3.3 x 2.6 x 2.6 cm mass in the upper outer left breast, and left axillary lymphadenopathy, at least 3 abnormal lymph nodes, no other additional sites of concern.      02/12/2016 Imaging    CT chest, abdomen and pelvis with contrast showed a 2.4 x 2.7 cm lesion in the upper outer left breast, left axillary node metastasis measuring up to 1.3 cm, no evidence of distant metastasis.      02/12/2016 Imaging    Bone scan was negative for skeletal metastasis.      02/13/2016 -  Chemotherapy    Dose dense Adriamycin 60 mg/m, Cytoxan 600 mg/m, every 2 weeks, for 4 cycles, followed by weekly carboplatin and  Taxol for 12 weeks       HISTORY OF PRESENTING ILLNESS:  Grace Moyer 55 y.o. female is here because of her recently diagnosed left breast cancer. She was accompanied By her husband to our multidisciplinary breast clinic today.  This was discovered by screening mammogram, she had no palpable breast mass or axilla note. She denies any other new symptoms. She underwent a diagnostic mammogram and ultrasound on 01/28/2016, which showed a 2.9 cm mass in the upper outer quadrant, and multiple enlarged left axillary nodes, largest 2.5 cm. She underwent core needle biopsy of the left breast mass and left axillary node, both showed invasive ductal carcinoma, grade 3, triple negative, Ki-67 85%.  She developed bilateral numbness and tingling of her legs and hands at the end of May 2017. She underwent a multiple scans, and lab test, and was finally seen by neurologist Dr. Moshe Cipro, and was diagnosed with neuromyelitis optica spectrum disorder (NOSD). She initially received high-dose steroids, and then started Rituxan infusion, status post 2 infusions, now is on every four-month schedule, next due in Jan 2018. She states her neuropathy has been stable, she denies any significant vision problem, or other neurological symptoms. Her hand function and gait are normal. She is an Clinical biochemist.   GYN HISTORY  Menarchal: 13 LMP: 48 (hysterectomy) Contraceptive: 3 years  HRT: n/a  G1P1: 73 yo daughter   CURRENT THERAPY: Dose  dense Adriamycin 60 mg/m, Cytoxan 600 mg/m, every 2 weeks, for 4 cycles, followed by weekly carboplatin and Taxol for 12 weeks  INTERIM HISTORY Mrs Bruyere returns for follow-up and start first cycle chemotherapy. She is doing well, denies any pain or other new symptoms. She has had a port put in earlier this week, mild soreness at port site, no other complaints.  INTERIM HISTORY  Elmer returns    MEDICAL HISTORY:  Past Medical History:  Diagnosis Date  . Anxiety   . Malignant  neoplasm of upper-outer quadrant of left female breast ([place]) 01/31/2016  . Neuromyelitis optica ([place])     SURGICAL HISTORY: Past Surgical History:  Procedure Laterality Date  . ABDOMINAL HYSTERECTOMY     partial  . EYE SURGERY    . PORTACATH PLACEMENT Right 02/11/2016   Procedure: INSERTION PORT-A-CATH WITH [place];  Surgeon: Rolm Bookbinder, MD;  Location: [place];  Service: General;  Laterality: Right;    SOCIAL HISTORY: Social History   Social History  . Marital status: Married    Spouse name: N/A  . Number of children: N/A  . Years of education: N/A   Occupational History  . Not on file.   Social History Main Topics  . Smoking status: Former Smoker    Types: Cigarettes    Quit date: 09/03/2001  . Smokeless tobacco: Never Used  . Alcohol use No  . Drug use: No  . Sexual activity: Not on file   Other Topics Concern  . Not on file   Social History Narrative  . No narrative on file    FAMILY HISTORY: Family History  Problem Relation Age of Onset  . Cancer Maternal Grandmother 82  . CAD Paternal Grandmother   . Colon cancer Paternal Uncle     ALLERGIES:  has No Known Allergies.  MEDICATIONS:  Current Outpatient Prescriptions  Medication Sig Dispense Refill  . Cholecalciferol (VITAMIN D-1000 MAX ST) 1000 units tablet Take 2 tablets by mouth daily.    Marland Kitchen HYDROcodone-acetaminophen (NORCO) 10-325 MG tablet Take 1 tablet by mouth every 6 (six) hours as needed. 10 tablet 0  . riTUXimab (RITUXAN) 100 MG/10ML injection Inject into the vein every 4 (four) months.    . lidocaine-prilocaine (EMLA) cream Apply to affected area once 30 g 3  . ondansetron (ZOFRAN) 8 MG tablet Take 1 tablet (8 mg total) by mouth 2 (two) times daily as needed. Start on the third day after chemotherapy. 30 tablet 1  . prochlorperazine (COMPAZINE) 10 MG tablet Take 1 tablet (10 mg total) by mouth every 6 (six) hours as needed (Nausea or vomiting). 30 tablet 1   No current  facility-administered medications for this visit.     REVIEW OF SYSTEMS:   Constitutional: Denies fevers, chills or abnormal night sweats Eyes: Denies blurriness of vision, double vision or watery eyes Ears, nose, mouth, throat, and face: Denies mucositis or sore throat Respiratory: Denies cough, dyspnea or wheezes Cardiovascular: Denies palpitation, chest discomfort or lower extremity swelling Gastrointestinal:  Denies nausea, heartburn or change in bowel habits Skin: Denies abnormal skin rashes Lymphatics: Denies new lymphadenopathy or easy bruising Neurological:Denies numbness, tingling or new weaknesses Behavioral/Psych: Mood is stable, no new changes  All other systems were reviewed with the patient and are negative.  PHYSICAL EXAMINATION: ECOG PERFORMANCE STATUS: 1 - Symptomatic but completely ambulatory  Vitals:   02/13/16 1224  BP: 122/60  Pulse: 67  Resp: 16  Temp: 98.2 F (36.8 C)   Filed Weights  02/13/16 1224  Weight: 205 lb 4.8 oz (93.1 kg)    GENERAL:alert, no distress and comfortable SKIN: skin color, texture, turgor are normal, no rashes or significant lesions EYES: normal, conjunctiva are pink and non-injected, sclera clear OROPHARYNX:no exudate, no erythema and lips, buccal mucosa, and tongue normal  NECK: supple, thyroid normal size, non-tender, without nodularity LYMPH:  no palpable lymphadenopathy in the cervical, axillary or inguinal LUNGS: clear to auscultation and percussion with normal breathing effort HEART: regular rate & rhythm and no murmurs and no lower extremity edema ABDOMEN:abdomen soft, non-tender and normal bowel sounds Musculoskeletal:no cyanosis of digits and no clubbing  PSYCH: alert & oriented x 3 with fluent speech NEURO: no focal motor/sensory deficits Breasts: Breast inspection showed them to be symmetrical with no nipple discharge. Palpation of the breasts and axilla revealed no obvious mass that I could appreciate, there is  fullness at the upper-out quadrant of left breast.    LABORATORY DATA:  I have reviewed the data as listed CBC Latest Ref Rng & Units 02/13/2016 02/06/2016 08/14/2015  WBC 3.9 - 10.3 10e3/uL 6.6 5.7 7.9  Hemoglobin 11.6 - 15.9 g/dL 13.8 15.0 15.3(H)  Hematocrit 34.8 - 46.6 % 41.4 44.4 43.9  Platelets 145 - 400 10e3/uL 217 204 211   CMP Latest Ref Rng & Units 02/13/2016 02/06/2016 08/14/2015  Glucose 70 - 140 mg/dl 86 80 96  BUN 7.0 - 26.0 mg/dL 11.9 15.3 13  Creatinine 0.6 - 1.1 mg/dL 0.8 0.9 0.79  Sodium 136 - 145 mEq/L 143 142 137  Potassium 3.5 - 5.1 mEq/L 3.9 4.4 3.5  Chloride 101 - 111 mmol/L - - 106  CO2 22 - 29 mEq/L 28 25 20(L)  Calcium 8.4 - 10.4 mg/dL 9.3 9.4 8.9  Total Protein 6.4 - 8.3 g/dL 6.8 7.2 7.2  Total Bilirubin 0.20 - 1.20 mg/dL 0.43 0.54 1.0  Alkaline Phos 40 - 150 U/L 82 97 53  AST 5 - 34 U/L _0 ALT 0 - 55 U/L 22 35 13(L)   PATHOLOGY REPORT  Diagnosis 01/29/2016 1. Breast, left, needle core biopsy, 1:30 o'clock - INVASIVE DUCTAL CARCINOMA, GRADE 3. - LYMPHOVASCULAR INVOLVEMENT BY TUMOR. 2. Lymph node, needle/core biopsy, left axillary - METASTATIC CARCINOMA. Microscopic Comment 1. , 2. A breast prognostic profile will be performed  1. Results: IMMUNOHISTOCHEMICAL AND MORPHOMETRIC ANALYSIS PERFORMED MANUALLY Estrogen Receptor: 0%, NEGATIVE Progesterone Receptor: 0%, NEGATIVE Proliferation Marker Ki67: 85%  Results: HER2 - NEGATIVE RATIO OF HER2/CEP17 SIGNALS 1.26 AVERAGE HER2 COPY NUMBER PER CELL 1.95  2. FLUORESCENCE IN-SITU HYBRIDIZATION Results: HER2 - NEGATIVE RATIO OF HER2/CEP17 SIGNALS 1.25 AVERAGE HER2 COPY NUMBER PER CELL 2.00  Results: IMMUNOHISTOCHEMICAL AND MORPHOMETRIC ANALYSIS PERFORMED MANUALLY Estrogen Receptor: 0%, NEGATIVE Progesterone Receptor: <1%, NEGATIVE, WEAK STAINING INTENSITY Proliferation Marker Ki67: 90%  RADIOGRAPHIC STUDIES: I have personally reviewed the radiological images as listed and agreed with the  findings in the report. Ct Chest W Contrast  Result Date: 02/12/2016 CLINICAL DATA:  Newly diagnosed left breast cancer EXAM: CT CHEST, ABDOMEN, AND PELVIS WITH CONTRAST TECHNIQUE: Multidetector CT imaging of the chest, abdomen and pelvis was performed following the standard protocol during bolus administration of intravenous contrast. CONTRAST:  131m ISOVUE-300 IOPAMIDOL (ISOVUE-300) INJECTION 61% COMPARISON:  None. FINDINGS: CT CHEST FINDINGS Cardiovascular: The heart is normal in size. No pericardial effusion. Right chest port terminates in the mid SVC. Mediastinum/Nodes:  No suspicious mediastinal lymphadenopathy. Left axillary lymphadenopathy measuring up to 13 mm short axis (series 2/image 18). Visualized thyroid is  unremarkable. Lungs/Pleura:  Lungs are clear. Mild linear scarring/atelectasis in the lingula and left lower lobe. No focal consolidation. No pleural effusion or pneumothorax. Musculoskeletal: 2.4 x 2.7 cm lesion in the upper outer left breast (series 2/image 25), corresponding to the patient's known breast neoplasm. Very mild degenerative changes of the thoracic spine. CT ABDOMEN PELVIS FINDINGS Hepatobiliary:  Liver is within normal limits. Gallbladder is within normal limits. No intrahepatic or extrahepatic ductal dilatation. Pancreas:  Within normal limits. Spleen:  Within normal limits. Adrenals/Urinary Tract:  Adrenal glands are within normal limits. Kidneys are within normal limits.  No hydronephrosis. Bladder is within normal limits. Stomach/Bowel:  Stomach is within normal limits. No evidence of bowel obstruction. Normal appendix. Vascular/Lymphatic:  No evidence of aortic aneurysm. No suspicious abdominopelvic lymphadenopathy. Reproductive:  Status post hysterectomy. Bilateral ovaries are within normal limits. Other:  No pelvic ascites. Musculoskeletal: Very mild degenerative changes of the lumbar spine. IMPRESSION: 2.4 x 2.7 cm lesion in the upper outer left breast, corresponding to  the patient's known breast neoplasm. Left axillary nodal metastasis measuring up to 13 mm short axis. No evidence of distant metastasis. Electronically Signed   By: Julian Hy M.D.   On: 02/12/2016 16:33   Nm Bone Scan Whole Body  Result Date: 02/12/2016 CLINICAL DATA:  Newly diagnosed invasive carcinoma of the left breast. EXAM: NUCLEAR MEDICINE WHOLE BODY BONE SCAN TECHNIQUE: Whole body anterior and posterior images were obtained approximately 3 hours after intravenous injection of radiopharmaceutical. RADIOPHARMACEUTICALS:  19 mCi Technetium-26mMDP IV COMPARISON:  None. FINDINGS: There is no evidence by bone scan of skeletal metastatic disease. Degenerative activity is noted in both AC joints, glenohumeral joints and hip joints. No visible abnormal soft tissue uptake. Normal excretion of activity is noted by the kidneys and into the bladder. IMPRESSION: No evidence of skeletal metastatic disease. Electronically Signed   By: GAletta EdouardM.D.   On: 02/12/2016 15:52   Ct Abdomen Pelvis W Contrast  Result Date: 02/12/2016 CLINICAL DATA:  Newly diagnosed left breast cancer EXAM: CT CHEST, ABDOMEN, AND PELVIS WITH CONTRAST TECHNIQUE: Multidetector CT imaging of the chest, abdomen and pelvis was performed following the standard protocol during bolus administration of intravenous contrast. CONTRAST:  102mISOVUE-300 IOPAMIDOL (ISOVUE-300) INJECTION 61% COMPARISON:  None. FINDINGS: CT CHEST FINDINGS Cardiovascular: The heart is normal in size. No pericardial effusion. Right chest port terminates in the mid SVC. Mediastinum/Nodes:  No suspicious mediastinal lymphadenopathy. Left axillary lymphadenopathy measuring up to 13 mm short axis (series 2/image 18). Visualized thyroid is unremarkable. Lungs/Pleura:  Lungs are clear. Mild linear scarring/atelectasis in the lingula and left lower lobe. No focal consolidation. No pleural effusion or pneumothorax. Musculoskeletal: 2.4 x 2.7 cm lesion in the upper  outer left breast (series 2/image 25), corresponding to the patient's known breast neoplasm. Very mild degenerative changes of the thoracic spine. CT ABDOMEN PELVIS FINDINGS Hepatobiliary:  Liver is within normal limits. Gallbladder is within normal limits. No intrahepatic or extrahepatic ductal dilatation. Pancreas:  Within normal limits. Spleen:  Within normal limits. Adrenals/Urinary Tract:  Adrenal glands are within normal limits. Kidneys are within normal limits.  No hydronephrosis. Bladder is within normal limits. Stomach/Bowel:  Stomach is within normal limits. No evidence of bowel obstruction. Normal appendix. Vascular/Lymphatic:  No evidence of aortic aneurysm. No suspicious abdominopelvic lymphadenopathy. Reproductive:  Status post hysterectomy. Bilateral ovaries are within normal limits. Other:  No pelvic ascites. Musculoskeletal: Very mild degenerative changes of the lumbar spine. IMPRESSION: 2.4 x 2.7 cm lesion in the  upper outer left breast, corresponding to the patient's known breast neoplasm. Left axillary nodal metastasis measuring up to 13 mm short axis. No evidence of distant metastasis. Electronically Signed   By: Julian Hy M.D.   On: 02/12/2016 16:33   Mr Breast Bilateral W Wo Contrast  Result Date: 02/11/2016 CLINICAL DATA:  55 year old female with screening detected malignancy in the left breast. The patient had an ultrasound-guided biopsy of the left breast mass and a left axillary lymph node on 01/29/2016 indicating grade 3 invasive ductal carcinoma with lymphovascular involvement by tumor in the left breast at 1:30 and metastatic carcinoma of the biopsied left axillary lymph node. LABS:  Not applicable. EXAM: BILATERAL BREAST MRI WITH AND WITHOUT CONTRAST TECHNIQUE: Multiplanar, multisequence MR images of both breasts were obtained prior to and following the intravenous administration of 19 ml of MultiHance. THREE-DIMENSIONAL MR IMAGE RENDERING ON INDEPENDENT WORKSTATION:  Three-dimensional MR images were rendered by post-processing of the original MR data on an independent workstation. The three-dimensional MR images were interpreted, and findings are reported in the following complete MRI report for this study. Three dimensional images were evaluated at the independent DynaCad workstation COMPARISON:  No prior MRI available for comparison. Correlation made with prior ultrasounds and mammograms. FINDINGS: Breast composition: c. Heterogeneous fibroglandular tissue. Background parenchymal enhancement: Mild. Right breast: No mass or abnormal enhancement. Left breast: There is an enhancing mass with internal susceptibility consistent with the biopsy proven site of malignancy in the upper-outer left breast, measuring 3.3 x 2.6 x 2.6 cm. There are no other enhancing masses or suspicious non mass enhancement in the left breast. Lymph nodes: There are at least 3 partially visualized abnormal appearing lymph nodes in the left axilla. The largest of these has susceptibility artifact consistent with the biopsy marking clip, and measures 2.3 x 1.6 cm in axial plane. No abnormal right axillary lymph nodes nor bilateral internal mammary lymph nodes are identified. Ancillary findings:  None. IMPRESSION: 1. The biopsy-proven malignancy in the upper-outer left breast measures 3.3 cm by MRI. No additional sites of concern are identified in the left breast. 2. Left axillary lymphadenopathy, with at least 3 abnormal partially visualized lymph nodes identified, 1 of which was previously biopsied showing metastatic disease. 3.  No MRI evidence of malignancy in the right breast. RECOMMENDATION: Continue treatment plan. BI-RADS CATEGORY  6: Known biopsy-proven malignancy. Electronically Signed   By: Ammie Ferrier M.D.   On: 02/11/2016 10:37   Dg Chest Port 1 View  Result Date: 02/11/2016 CLINICAL DATA:  Port-A-Cath insertion. EXAM: PORTABLE CHEST 1 VIEW COMPARISON:  08/16/2015. FINDINGS: 1319  hours. There is a new right IJ Port-A-Cath, tip at the lower SVC level. There are lower lung volumes with resulting mild bibasilar atelectasis. No evidence of pneumothorax, significant pleural effusion or confluent airspace opacity. The heart size and mediastinal contours are stable. IMPRESSION: Port-A-Cath placement without demonstrated complicating pneumothorax. Mild bibasilar atelectasis. Electronically Signed   By: Richardean Sale M.D.   On: 02/11/2016 13:44   Dg Fluoro Guide Cv Line-no Report  Result Date: 02/11/2016 There is no Radiologist interpretation  for this exam.  US Breast Ltd Uni Left Inc Axilla  Result Date: 01/28/2016 CLINICAL DATA:  Patient was called back from screening mammogram for a left breast mass. EXAM: 2D DIGITAL DIAGNOSTIC LEFT MAMMOGRAM WITH CAD AND ADJUNCT TOMO ULTRASOUND LEFT BREAST COMPARISON:  Previous exam(s). ACR Breast Density Category c: The breast tissue is heterogeneously dense, which may obscure small masses. FINDINGS: Additional imaging of the  left breast was performed. There is persistence of an obscured spiculated 2.9 cm mass in the upper-outer quadrant of the breast. There are no malignant type microcalcifications. Enlarged lymph nodes are seen in the axilla. Mammographic images were processed with CAD. On physical exam, I palpate discrete thickening in the left breast at 1:30 8 cm from the nipple. Targeted ultrasound is performed, showing an irregular hypoechoic mass in the left breast at 1:30 8 cm from the nipple measuring 2.2 x 2.5 x 3.2 cm. Sonographic evaluation of the left axilla shows multiple enlarged lymph nodes. The largest lymph node measures 2.5 cm with a 1.1 cm cortex. IMPRESSION: Imaging findings worrisome for invasive mammary carcinoma in the left breast with axillary metastasis. RECOMMENDATION: Ultrasound-guided core biopsies of the left breast mass and of a axillary lymph node is recommended. I have discussed the findings and recommendations with  the patient. Results were also provided in writing at the conclusion of the visit. If applicable, a reminder letter will be sent to the patient regarding the next appointment. BI-RADS CATEGORY  5: Highly suggestive of malignancy. Electronically Signed   By: Lillia Mountain M.D.   On: 01/28/2016 12:10   Mm Diag Breast Tomo Uni Left  Result Date: 01/28/2016 CLINICAL DATA:  Patient was called back from screening mammogram for a left breast mass. EXAM: 2D DIGITAL DIAGNOSTIC LEFT MAMMOGRAM WITH CAD AND ADJUNCT TOMO ULTRASOUND LEFT BREAST COMPARISON:  Previous exam(s). ACR Breast Density Category c: The breast tissue is heterogeneously dense, which may obscure small masses. FINDINGS: Additional imaging of the left breast was performed. There is persistence of an obscured spiculated 2.9 cm mass in the upper-outer quadrant of the breast. There are no malignant type microcalcifications. Enlarged lymph nodes are seen in the axilla. Mammographic images were processed with CAD. On physical exam, I palpate discrete thickening in the left breast at 1:30 8 cm from the nipple. Targeted ultrasound is performed, showing an irregular hypoechoic mass in the left breast at 1:30 8 cm from the nipple measuring 2.2 x 2.5 x 3.2 cm. Sonographic evaluation of the left axilla shows multiple enlarged lymph nodes. The largest lymph node measures 2.5 cm with a 1.1 cm cortex. IMPRESSION: Imaging findings worrisome for invasive mammary carcinoma in the left breast with axillary metastasis. RECOMMENDATION: Ultrasound-guided core biopsies of the left breast mass and of a axillary lymph node is recommended. I have discussed the findings and recommendations with the patient. Results were also provided in writing at the conclusion of the visit. If applicable, a reminder letter will be sent to the patient regarding the next appointment. BI-RADS CATEGORY  5: Highly suggestive of malignancy. Electronically Signed   By: Lillia Mountain M.D.   On: 01/28/2016  12:10   Mm Clip Placement Left  Result Date: 01/29/2016 CLINICAL DATA:  Status post ultrasound-guided core biopsy of mass in the 130 o'clock location of the left breast and an enlarged left axillary lymph node. EXAM: DIAGNOSTIC LEFT MAMMOGRAM POST ULTRASOUND BIOPSY sense to COMPARISON:  Previous exam(s). FINDINGS: Mammographic images were obtained following ultrasound guided biopsy of mass in the 130 o'clock location of the left breast and placement of a coil shaped clip. Coil shaped clip is identified in the upper-outer quadrant of the left breast within the mammographically detected mass. Within the left axilla, a spiral shaped clip is present in 1 of the enlarged left axillary lymph nodes. IMPRESSION: Tissue marker clips are in the expected locations after biopsy. Final Assessment: Post Procedure Mammograms for Marker Placement  Electronically Signed   By: Nolon Nations M.D.   On: 01/29/2016 10:38   [place] Lt Breast Bx W Loc Dev 1st Lesion Img Bx Spec US Guide  Addendum Date: 01/31/2016   ADDENDUM REPORT: 01/30/2016 14:41 ADDENDUM: Pathology revealed GRADE III INVASIVE DUCTAL CARCINOMA with LYMPHOVASCULAR INVOLVEMENT BY TUMOR of the Left breast at the 1:30 o'clock location. METASTATIC CARCINOMA of the Left axillary lymph node. This was found to be concordant by Dr. Nolon Nations. Pathology results were discussed with the patient by telephone. The patient reported doing well after the biopsies with tenderness at the sites. Post biopsy instructions and care were reviewed and questions were answered. The patient was encouraged to call [facility] for any additional concerns. The patient was referred to [facility] on February 06, 2016. Pathology results reported by Terie Purser, RN on 01/30/2016. Electronically Signed   By: Nolon Nations M.D.   On: 01/30/2016 14:41   Result Date: 01/31/2016 CLINICAL  DATA:  Patient presents for ultrasound-guided core biopsy of mass in the 130 o'clock location of the left breast. EXAM: ULTRASOUND GUIDED LEFT BREAST CORE NEEDLE BIOPSY COMPARISON:  Previous exam(s). FINDINGS: I met with the patient and we discussed the procedure of ultrasound-guided biopsy, including benefits and alternatives. We discussed the high likelihood of a successful procedure. We discussed the risks of the procedure, including infection, bleeding, tissue injury, clip migration, and inadequate sampling. Informed written consent was given. The usual time-out protocol was performed immediately prior to the procedure. Using sterile technique and 1% Lidocaine as local anesthetic, under direct ultrasound visualization, a 12 gauge spring-loaded device was used to perform biopsy of mass in the 130 o'clock location of the left breast using a lateral approach. At the conclusion of the procedure a coil shaped tissue marker clip was deployed into the biopsy cavity. Follow up 2 view mammogram was performed and dictated separately. IMPRESSION: Ultrasound guided biopsy of left breast mass. No apparent complications. Electronically Signed: By: Nolon Nations M.D. On: 01/29/2016 10:21   [place] Lt Breast Bx W Loc Dev Ea Add Lesion Img Bx Spec US Guide  Result Date: 01/29/2016 CLINICAL DATA:  Patient presents for ultrasound-guided core biopsy of left axillary lymph node. EXAM: ULTRASOUND GUIDED CORE NEEDLE BIOPSY OF A LEFT AXILLARY NODE COMPARISON:  Previous exam(s). FINDINGS: I met with the patient and we discussed the procedure of ultrasound-guided biopsy, including benefits and alternatives. We discussed the high likelihood of a successful procedure. We discussed the risks of the procedure, including infection, bleeding, tissue injury, clip migration, and inadequate sampling. Informed written consent was given. The usual time-out protocol was performed immediately prior to the procedure. Using sterile technique and 1%  Lidocaine as local anesthetic, under direct ultrasound visualization, a 14 gauge spring-loaded device was used to perform biopsy of enlarged left axillary lymph node using a lateral approach. At the conclusion of the procedure a spiral shaped HydroMARK tissue marker clip was deployed into the biopsy cavity. Follow up 2 view mammogram was performed and dictated separately. IMPRESSION: Ultrasound guided biopsy of left axillary lymph node. No apparent complications. Electronically Signed   By: Nolon Nations M.D.   On: 01/29/2016 10:23    ASSESSMENT & PLAN: 55 year old Caucasian woman with past medical history of depression, presented with a screening mammogram discovered left breast cancer   1. Malignant neoplasm of upper-outer quadrant of left breast, invasive ductal carcinoma, G3, cT2N1Mx, stage IIB, ER-/PR-/HER2- -  I reviewed her imaging findings and the biopsy results in great detail with patient and her husband. -I discussed her breast MRI, CT scan and bone scan findings, which all confirmed the left breast lesion and axillary adenopathy, no other distant metastasis. -she was seen by Dr. Donne Hazel and surgical options were discussed  -We discussed that triple negative breast cancers are much more aggressive, and her risk of cancer recurrence after breast surgery is pretty high, giving the multiple positive lymph nodes. I recommend her to consider neoadjuvant chemotherapy, to reduce her risk of recurrence, and downstage her breast cancer, to make lumpectomy and sentinel lymph node biopsy as a more feasible surgery. -I recommend her to have dose dense Adriamycin, Cytoxan every 2 weeks, with Neulasta support, for 4 cycles, followed by weekly carboplatin and Taxol for 12 weeks or carboplatin and docetaxel every 3 weeks for 4 cycles.  -the goal of therapy is curative  -Due to her underlying NOSD, and existing peripheral neuropathy, my main concern of long-term side effects from chemotherapy is worsening  neuropathy, especially from Taxol or Taxotere, we will monitor closely, and may need to hold it if her neuropathy gets worse.  -I spoke with her neurologist Dr. Moshe Cipro who agrees with her chemo for breat cancer  -Her baseline echocardiogram was normal. -Lab reviewed, adequate for treatment, we'll proceed with first cycle chemotherapy today -I have called in zofran and compazine for nausea   2. Genetics -Given her young age and triple negative disease, we recommend her to have genetic testing to ruled out inheritable breast cancer syndrome, and the test result may impact her surgery.  3. neuromyelitis optica spectrum disorder (NOSD) -she will continue follow up with her neurologist Dr. Moshe Cipro at [facility]  -she is on rituximab every 4 weeks, next due in January 2018. It will be OK to continue her Rituximab when she is on chemotherapy, but the combination therapy will further compromised her immune system. -I have spoken with Dr. Moshe Cipro about her breast cancer treatment, he is agreeable.   Plan -scan and lab reviewed  -first cycle AC today  -lab, f/u and chemo in 2 weeks     Orders Placed This Encounter  Procedures  . CBC with Differential    Standing Status:   Standing    Number of Occurrences:   50    Standing Expiration Date:   02/12/2021  . Comprehensive metabolic panel    Standing Status:   Standing    Number of Occurrences:   50    Standing Expiration Date:   02/12/2021  . CA 27.29    Standing Status:   Future    Number of Occurrences:   1    Standing Expiration Date:   02/12/2017    All questions were answered. The patient knows to call the clinic with any problems, questions or concerns. I spent 25 minutes counseling the patient face to face. The total time spent in the appointment was 30 minutes and more than 50% was on counseling.     Truitt Merle, MD 02/13/2016

## 2016-02-13 NOTE — Progress Notes (Signed)
Blood return noted before, every 3cc and after Adriamycin push.  Patient stable upon discharge.

## 2016-02-13 NOTE — Telephone Encounter (Signed)
Message sent to chemo scheduler to be be added per 02/13/16 los. Appointments scheduled per 02/13/16 los. A copy of the AVS report and appointment schedule was given to the patient. Per 02/13/16 los.

## 2016-02-14 ENCOUNTER — Telehealth: Payer: Self-pay | Admitting: *Deleted

## 2016-02-14 LAB — CANCER ANTIGEN 27.29: CA 27.29: 13.1 U/mL (ref 0.0–38.6)

## 2016-02-14 NOTE — Telephone Encounter (Signed)
Per LOS I have scheduled appts and notified the scheduler 

## 2016-02-14 NOTE — Telephone Encounter (Signed)
Pt called wanting to know what pt could take for headache.  Had chemo yesterday 02/13/16.  Spoke with pt and was informed that pt had headache early am but now is gone.  Pt was not sure what to take.  Informed pt that she can take Tylenol for headach;  Instructed pt to take Claritin for a few days since pt had Neulasta onpro.   Reinforced need to increase po fluids as tolerated throughout chemo treatments.  Pt voiced understanding.

## 2016-02-15 ENCOUNTER — Ambulatory Visit: Payer: 59

## 2016-02-27 ENCOUNTER — Other Ambulatory Visit (HOSPITAL_BASED_OUTPATIENT_CLINIC_OR_DEPARTMENT_OTHER): Payer: 59

## 2016-02-27 ENCOUNTER — Encounter: Payer: Self-pay | Admitting: Hematology

## 2016-02-27 ENCOUNTER — Ambulatory Visit (HOSPITAL_BASED_OUTPATIENT_CLINIC_OR_DEPARTMENT_OTHER): Payer: 59

## 2016-02-27 ENCOUNTER — Ambulatory Visit: Payer: 59

## 2016-02-27 ENCOUNTER — Ambulatory Visit (HOSPITAL_BASED_OUTPATIENT_CLINIC_OR_DEPARTMENT_OTHER): Payer: 59 | Admitting: Hematology

## 2016-02-27 ENCOUNTER — Telehealth: Payer: Self-pay | Admitting: *Deleted

## 2016-02-27 VITALS — BP 124/68 | HR 70 | Temp 98.6°F | Resp 18 | Ht 63.0 in | Wt 202.1 lb

## 2016-02-27 DIAGNOSIS — Z5111 Encounter for antineoplastic chemotherapy: Secondary | ICD-10-CM

## 2016-02-27 DIAGNOSIS — C50419 Malignant neoplasm of upper-outer quadrant of unspecified female breast: Secondary | ICD-10-CM

## 2016-02-27 DIAGNOSIS — Z171 Estrogen receptor negative status [ER-]: Secondary | ICD-10-CM

## 2016-02-27 DIAGNOSIS — G360 Neuromyelitis optica [Devic]: Secondary | ICD-10-CM | POA: Diagnosis not present

## 2016-02-27 DIAGNOSIS — C50412 Malignant neoplasm of upper-outer quadrant of left female breast: Secondary | ICD-10-CM | POA: Diagnosis not present

## 2016-02-27 DIAGNOSIS — Z5189 Encounter for other specified aftercare: Secondary | ICD-10-CM | POA: Diagnosis not present

## 2016-02-27 LAB — CBC WITH DIFFERENTIAL/PLATELET
BASO%: 0.4 % (ref 0.0–2.0)
Basophils Absolute: 0 10*3/uL (ref 0.0–0.1)
EOS%: 0.2 % (ref 0.0–7.0)
Eosinophils Absolute: 0 10*3/uL (ref 0.0–0.5)
HCT: 37.4 % (ref 34.8–46.6)
HGB: 12.7 g/dL (ref 11.6–15.9)
LYMPH%: 13.3 % — ABNORMAL LOW (ref 14.0–49.7)
MCH: 29 pg (ref 25.1–34.0)
MCHC: 34 g/dL (ref 31.5–36.0)
MCV: 85.4 fL (ref 79.5–101.0)
MONO#: 0.5 10*3/uL (ref 0.1–0.9)
MONO%: 5.2 % (ref 0.0–14.0)
NEUT#: 7.4 10*3/uL — ABNORMAL HIGH (ref 1.5–6.5)
NEUT%: 80.9 % — ABNORMAL HIGH (ref 38.4–76.8)
Platelets: 177 10*3/uL (ref 145–400)
RBC: 4.38 10*6/uL (ref 3.70–5.45)
RDW: 13.3 % (ref 11.2–14.5)
WBC: 9.2 10*3/uL (ref 3.9–10.3)
lymph#: 1.2 10*3/uL (ref 0.9–3.3)

## 2016-02-27 LAB — COMPREHENSIVE METABOLIC PANEL
ALT: 69 U/L — ABNORMAL HIGH (ref 0–55)
AST: 29 U/L (ref 5–34)
Albumin: 3.5 g/dL (ref 3.5–5.0)
Alkaline Phosphatase: 175 U/L — ABNORMAL HIGH (ref 40–150)
Anion Gap: 8 mEq/L (ref 3–11)
BUN: 8 mg/dL (ref 7.0–26.0)
CO2: 23 mEq/L (ref 22–29)
Calcium: 8.9 mg/dL (ref 8.4–10.4)
Chloride: 109 mEq/L (ref 98–109)
Creatinine: 0.8 mg/dL (ref 0.6–1.1)
EGFR: 86 mL/min/{1.73_m2} — ABNORMAL LOW (ref >90)
Glucose: 122 mg/dl (ref 70–140)
Potassium: 3.8 mEq/L (ref 3.5–5.1)
Sodium: 141 mEq/L (ref 136–145)
Total Bilirubin: 0.32 mg/dL (ref 0.20–1.20)
Total Protein: 6.5 g/dL (ref 6.4–8.3)

## 2016-02-27 MED ORDER — PALONOSETRON HCL INJECTION 0.25 MG/5ML
0.2500 mg | Freq: Once | INTRAVENOUS | Status: AC
  Administered 2016-02-27: 0.25 mg via INTRAVENOUS

## 2016-02-27 MED ORDER — SODIUM CHLORIDE 0.9% FLUSH
10.0000 mL | INTRAVENOUS | Status: DC | PRN
  Administered 2016-02-27: 10 mL via INTRAVENOUS
  Filled 2016-02-27: qty 10

## 2016-02-27 MED ORDER — HEPARIN SOD (PORK) LOCK FLUSH 100 UNIT/ML IV SOLN
500.0000 [IU] | Freq: Once | INTRAVENOUS | Status: AC | PRN
  Administered 2016-02-27: 500 [IU]
  Filled 2016-02-27: qty 5

## 2016-02-27 MED ORDER — SODIUM CHLORIDE 0.9% FLUSH
10.0000 mL | INTRAVENOUS | Status: DC | PRN
  Administered 2016-02-27: 10 mL
  Filled 2016-02-27: qty 10

## 2016-02-27 MED ORDER — SODIUM CHLORIDE 0.9 % IV SOLN
Freq: Once | INTRAVENOUS | Status: AC
  Administered 2016-02-27: 17:00:00 via INTRAVENOUS
  Filled 2016-02-27: qty 5

## 2016-02-27 MED ORDER — PALONOSETRON HCL INJECTION 0.25 MG/5ML
INTRAVENOUS | Status: AC
  Filled 2016-02-27: qty 5

## 2016-02-27 MED ORDER — DOXORUBICIN HCL CHEMO IV INJECTION 2 MG/ML
60.0000 mg/m2 | Freq: Once | INTRAVENOUS | Status: AC
  Administered 2016-02-27: 122 mg via INTRAVENOUS
  Filled 2016-02-27: qty 61

## 2016-02-27 MED ORDER — SODIUM CHLORIDE 0.9 % IV SOLN
Freq: Once | INTRAVENOUS | Status: AC
  Administered 2016-02-27: 16:00:00 via INTRAVENOUS

## 2016-02-27 MED ORDER — CYCLOPHOSPHAMIDE CHEMO INJECTION 1 GM
600.0000 mg/m2 | Freq: Once | INTRAMUSCULAR | Status: AC
  Administered 2016-02-27: 1220 mg via INTRAVENOUS
  Filled 2016-02-27: qty 61

## 2016-02-27 MED ORDER — PEGFILGRASTIM 6 MG/0.6ML ~~LOC~~ PSKT
6.0000 mg | PREFILLED_SYRINGE | Freq: Once | SUBCUTANEOUS | Status: AC
  Administered 2016-02-27: 6 mg via SUBCUTANEOUS
  Filled 2016-02-27: qty 0.6

## 2016-02-27 NOTE — Telephone Encounter (Signed)
Per LOS I have scheduled appt, and gave calendar

## 2016-02-27 NOTE — Progress Notes (Signed)
OK to treat with ALT 69 as per Dr. Burr Medico.

## 2016-02-27 NOTE — Patient Instructions (Signed)
[facility] Discharge Instructions for Patients Receiving Chemotherapy  Today you received the following chemotherapy agents: Adriamycin and Cytoxan   To help prevent nausea and vomiting after your treatment, we encourage you to take your nausea medication as directed.  Do not take Zofran for three days, on 02/16/16 may start taking Zofran 8 mg twice daily as needed for nausea and Vomiting. Starting today (02/13/16) may take compazine 10 mg every 6 hours as needed for nausea and vomiting.    If you develop nausea and vomiting that is not controlled by your nausea medication, call the clinic.   BELOW ARE SYMPTOMS THAT SHOULD BE REPORTED IMMEDIATELY:  *FEVER GREATER THAN 100.5 F  *CHILLS WITH OR WITHOUT FEVER  NAUSEA AND VOMITING THAT IS NOT CONTROLLED WITH YOUR NAUSEA MEDICATION  *UNUSUAL SHORTNESS OF BREATH  *UNUSUAL BRUISING OR BLEEDING  TENDERNESS IN MOUTH AND THROAT WITH OR WITHOUT PRESENCE OF ULCERS  *URINARY PROBLEMS  *BOWEL PROBLEMS  UNUSUAL RASH Items with * indicate a potential emergency and should be followed up as soon as possible.  Feel free to call the clinic you have any questions or concerns. The clinic phone number is [phone].  Please show the [facility] at check-in to the Emergency Department and triage nurse.

## 2016-02-27 NOTE — Progress Notes (Signed)
[place]  [phone] [phone]  Clinic Follow up Note   Patient Care Team: Maisie Fus, MD as PCP - General (Obstetrics and Gynecology) Collene Gobble, MD as Attending Physician (Psychiatry) Rolm Bookbinder, MD as Consulting Physician (General Surgery) Truitt Merle, MD as Consulting Physician (Hematology) Kyung Rudd, MD as Consulting Physician (Radiation Oncology) 02/27/2016  CHIEF COMPLAINTS:  Follow up left breast triple negative cancer  Oncology History   Malignant neoplasm of upper-outer quadrant of left female breast [facility])   Staging form: Breast, AJCC 7th Edition   - Clinical stage from 01/29/2016: Stage IIB (T2, N1, M0) - Signed by Truitt Merle, MD on 02/06/2016      Malignant neoplasm of upper-outer quadrant of left female breast ([place])   01/28/2016 Mammogram    Diagnostic MM and US showed a 2.9cm (3.2X2.2X2.5cm on [place]) mass in [place], multiple enlarged left axillary nodes, largest 2.5cm.       01/29/2016 Initial Diagnosis    Malignant neoplasm of upper-outer quadrant of left female breast ([place])      01/29/2016 Initial Biopsy    Left breast mass and axillary node biopsy showed IDC, G3,       01/29/2016 Receptors her2    ER-, PR-, HER2-, Ki67 85%      02/11/2016 Imaging    Bilateral breast MRI with and without contrast showed a 3.3 x 2.6 x 2.6 cm mass in the upper outer left breast, and left axillary lymphadenopathy, at least 3 abnormal lymph nodes, no other additional sites of concern.      02/12/2016 Imaging    CT chest, abdomen and pelvis with contrast showed a 2.4 x 2.7 cm lesion in the upper outer left breast, left axillary node metastasis measuring up to 1.3 cm, no evidence of distant metastasis.      02/12/2016 Imaging    Bone scan was negative for skeletal metastasis.      02/13/2016 -  Chemotherapy    Dose dense Adriamycin 60 mg/m, Cytoxan 600 mg/m, every 2 weeks, for 4 cycles, followed by weekly carboplatin and  Taxol for 12 weeks       HISTORY OF PRESENTING ILLNESS:  Grace Moyer 55 y.o. female is here because of her recently diagnosed left breast cancer. She was accompanied By her husband to our multidisciplinary breast clinic today.  This was discovered by screening mammogram, she had no palpable breast mass or axilla note. She denies any other new symptoms. She underwent a diagnostic mammogram and ultrasound on 01/28/2016, which showed a 2.9 cm mass in the upper outer quadrant, and multiple enlarged left axillary nodes, largest 2.5 cm. She underwent core needle biopsy of the left breast mass and left axillary node, both showed invasive ductal carcinoma, grade 3, triple negative, Ki-67 85%.  She developed bilateral numbness and tingling of her legs and hands at the end of May 2017. She underwent a multiple scans, and lab test, and was finally seen by neurologist Dr. Moshe Cipro, and was diagnosed with neuromyelitis optica spectrum disorder (NOSD). She initially received high-dose steroids, and then started Rituxan infusion, status post 2 infusions, now is on every four-month schedule, next due in Jan 2018. She states her neuropathy has been stable, she denies any significant vision problem, or other neurological symptoms. Her hand function and gait are normal. She is an Clinical biochemist.   GYN HISTORY  Menarchal: 13 LMP: 72 (hysterectomy) Contraceptive: 3 years  HRT: n/a  G1P1: 54 yo daughter   CURRENT THERAPY: Dose  dense Adriamycin 60 mg/m, Cytoxan 600 mg/m, every 2 weeks, for 4 cycles, followed by weekly carboplatin and Taxol for 12 weeks started 02/13/2016  INTERIM HISTORY Grace Moyer returns for follow-up and her second cycle of chemotherapy. She reports chemo went well, but she has fatigue. States she feels like she has a sore throat when she swallows and reports a sore jaw and swollen neck. Reports some diarrhea.   MEDICAL HISTORY:  Past Medical History:  Diagnosis Date  . Anxiety   .  Malignant neoplasm of upper-outer quadrant of left female breast ([place]) 01/31/2016  . Neuromyelitis optica ([place])     SURGICAL HISTORY: Past Surgical History:  Procedure Laterality Date  . ABDOMINAL HYSTERECTOMY     partial  . EYE SURGERY    . PORTACATH PLACEMENT Right 02/11/2016   Procedure: INSERTION PORT-A-CATH WITH [place];  Surgeon: Rolm Bookbinder, MD;  Location: [place];  Service: General;  Laterality: Right;    SOCIAL HISTORY: Social History   Social History  . Marital status: Married    Spouse name: N/A  . Number of children: N/A  . Years of education: N/A   Occupational History  . Not on file.   Social History Main Topics  . Smoking status: Former Smoker    Types: Cigarettes    Quit date: 09/03/2001  . Smokeless tobacco: Never Used  . Alcohol use No  . Drug use: No  . Sexual activity: Not on file   Other Topics Concern  . Not on file   Social History Narrative  . No narrative on file    FAMILY HISTORY: Family History  Problem Relation Age of Onset  . Cancer Maternal Grandmother 69  . CAD Paternal Grandmother   . Colon cancer Paternal Uncle     ALLERGIES:  has No Known Allergies.  MEDICATIONS:  Current Outpatient Prescriptions  Medication Sig Dispense Refill  . Cholecalciferol (VITAMIN D-1000 MAX ST) 1000 units tablet Take 2 tablets by mouth daily.    Marland Kitchen HYDROcodone-acetaminophen (NORCO) 10-325 MG tablet Take 1 tablet by mouth every 6 (six) hours as needed. 10 tablet 0  . lidocaine-prilocaine (EMLA) cream Apply to affected area once 30 g 3  . ondansetron (ZOFRAN) 8 MG tablet Take 1 tablet (8 mg total) by mouth 2 (two) times daily as needed. Start on the third day after chemotherapy. 30 tablet 1  . prochlorperazine (COMPAZINE) 10 MG tablet Take 1 tablet (10 mg total) by mouth every 6 (six) hours as needed (Nausea or vomiting). 30 tablet 1  . riTUXimab (RITUXAN) 100 MG/10ML injection Inject into the vein every 4 (four) months.     No  current facility-administered medications for this visit.     REVIEW OF SYSTEMS:   Constitutional: Denies fevers, chills or abnormal night sweats Eyes: Denies blurriness of vision, double vision or watery eyes Ears, nose, mouth, throat, and face: Denies mucositis or sore throat Respiratory: Denies cough, dyspnea or wheezes Cardiovascular: Denies palpitation, chest discomfort or lower extremity swelling Gastrointestinal:  Denies nausea, heartburn or change in bowel habits Skin: Denies abnormal skin rashes Lymphatics: Denies new lymphadenopathy or easy bruising Neurological:Denies numbness, tingling or new weaknesses Behavioral/Psych: Mood is stable, no new changes  Musculoskeletal: (+) Neck and swallowing pain. All other systems were reviewed with the patient and are negative.  PHYSICAL EXAMINATION: ECOG PERFORMANCE STATUS: 1 - Symptomatic but completely ambulatory  Vitals:   02/27/16 1431  BP: 124/68  Pulse: 70  Resp: 18  Temp: 98.6 F (37 C)  Filed Weights   02/27/16 1431  Weight: 202 lb 1.6 oz (91.7 kg)    GENERAL:alert, no distress and comfortable SKIN: skin color, texture, turgor are normal, no rashes or significant lesions EYES: normal, conjunctiva are pink and non-injected, sclera clear OROPHARYNX:no exudate, no erythema and lips, buccal mucosa, and tongue normal  NECK: supple, thyroid normal size, non-tender, without nodularity. (+) Incision in the right lower neck for port. Port appears clean, neck not significantly swollen, no erythema, no sign of infection. LYMPH:  no palpable lymphadenopathy in the cervical, axillary or inguinal LUNGS: clear to auscultation and percussion with normal breathing effort HEART: regular rate & rhythm and no murmurs and no lower extremity edema ABDOMEN:abdomen soft, non-tender and normal bowel sounds MUSCULOSKELETALl:no cyanosis of digits and no clubbing  PSYCH: alert & oriented x 3 with fluent speech NEURO: no focal motor/sensory  deficits Breasts: Breast inspection showed them to be symmetrical with no nipple discharge. Palpation of the breasts and axilla revealed no obvious mass that I could appreciate, there is fullness at the upper-out quadrant of left breast.    LABORATORY DATA:  I have reviewed the data as listed CBC Latest Ref Rng & Units 02/27/2016 02/13/2016 02/06/2016  WBC 3.9 - 10.3 10e3/uL 9.2 6.6 5.7  Hemoglobin 11.6 - 15.9 g/dL 12.7 13.8 15.0  Hematocrit 34.8 - 46.6 % 37.4 41.4 44.4  Platelets 145 - 400 10e3/uL 177 217 204   CMP Latest Ref Rng & Units 02/27/2016 02/13/2016 02/06/2016  Glucose 70 - 140 mg/dl 122 86 80  BUN 7.0 - 26.0 mg/dL 8.0 11.9 15.3  Creatinine 0.6 - 1.1 mg/dL 0.8 0.8 0.9  Sodium 136 - 145 mEq/L 141 143 142  Potassium 3.5 - 5.1 mEq/L 3.8 3.9 4.4  Chloride 101 - 111 mmol/L - - -  CO2 22 - 29 mEq/L 23 28 25   Calcium 8.4 - 10.4 mg/dL 8.9 9.3 9.4  Total Protein 6.4 - 8.3 g/dL 6.5 6.8 7.2  Total Bilirubin 0.20 - 1.20 mg/dL 0.32 0.43 0.54  Alkaline Phos 40 - 150 U/L 175(H) 82 97  AST 5 - 34 U/L 29 17 25   ALT 0 - 55 U/L 69(H) 22 35   PATHOLOGY REPORT  Diagnosis 01/29/2016 1. Breast, left, needle core biopsy, 1:30 o'clock - INVASIVE DUCTAL CARCINOMA, GRADE 3. - LYMPHOVASCULAR INVOLVEMENT BY TUMOR. 2. Lymph node, needle/core biopsy, left axillary - METASTATIC CARCINOMA. Microscopic Comment 1. , 2. A breast prognostic profile will be performed  1. Results: IMMUNOHISTOCHEMICAL AND MORPHOMETRIC ANALYSIS PERFORMED MANUALLY Estrogen Receptor: 0%, NEGATIVE Progesterone Receptor: 0%, NEGATIVE Proliferation Marker Ki67: 85%  Results: HER2 - NEGATIVE RATIO OF HER2/CEP17 SIGNALS 1.26 AVERAGE HER2 COPY NUMBER PER CELL 1.95  2. FLUORESCENCE IN-SITU HYBRIDIZATION Results: HER2 - NEGATIVE RATIO OF HER2/CEP17 SIGNALS 1.25 AVERAGE HER2 COPY NUMBER PER CELL 2.00  Results: IMMUNOHISTOCHEMICAL AND MORPHOMETRIC ANALYSIS PERFORMED MANUALLY Estrogen Receptor: 0%, NEGATIVE Progesterone  Receptor: <1%, NEGATIVE, WEAK STAINING INTENSITY Proliferation Marker Ki67: 90%  RADIOGRAPHIC STUDIES: I have personally reviewed the radiological images as listed and agreed with the findings in the report. Ct Chest W Contrast  Result Date: 02/12/2016 CLINICAL DATA:  Newly diagnosed left breast cancer EXAM: CT CHEST, ABDOMEN, AND PELVIS WITH CONTRAST TECHNIQUE: Multidetector CT imaging of the chest, abdomen and pelvis was performed following the standard protocol during bolus administration of intravenous contrast. CONTRAST:  182m ISOVUE-300 IOPAMIDOL (ISOVUE-300) INJECTION 61% COMPARISON:  None. FINDINGS: CT CHEST FINDINGS Cardiovascular: The heart is normal in size. No pericardial effusion. Right chest port terminates  in the mid SVC. Mediastinum/Nodes:  No suspicious mediastinal lymphadenopathy. Left axillary lymphadenopathy measuring up to 13 mm short axis (series 2/image 18). Visualized thyroid is unremarkable. Lungs/Pleura:  Lungs are clear. Mild linear scarring/atelectasis in the lingula and left lower lobe. No focal consolidation. No pleural effusion or pneumothorax. Musculoskeletal: 2.4 x 2.7 cm lesion in the upper outer left breast (series 2/image 25), corresponding to the patient's known breast neoplasm. Very mild degenerative changes of the thoracic spine. CT ABDOMEN PELVIS FINDINGS Hepatobiliary:  Liver is within normal limits. Gallbladder is within normal limits. No intrahepatic or extrahepatic ductal dilatation. Pancreas:  Within normal limits. Spleen:  Within normal limits. Adrenals/Urinary Tract:  Adrenal glands are within normal limits. Kidneys are within normal limits.  No hydronephrosis. Bladder is within normal limits. Stomach/Bowel:  Stomach is within normal limits. No evidence of bowel obstruction. Normal appendix. Vascular/Lymphatic:  No evidence of aortic aneurysm. No suspicious abdominopelvic lymphadenopathy. Reproductive:  Status post hysterectomy. Bilateral ovaries are within  normal limits. Other:  No pelvic ascites. Musculoskeletal: Very mild degenerative changes of the lumbar spine. IMPRESSION: 2.4 x 2.7 cm lesion in the upper outer left breast, corresponding to the patient's known breast neoplasm. Left axillary nodal metastasis measuring up to 13 mm short axis. No evidence of distant metastasis. Electronically Signed   By: Julian Hy M.D.   On: 02/12/2016 16:33   Nm Bone Scan Whole Body  Result Date: 02/12/2016 CLINICAL DATA:  Newly diagnosed invasive carcinoma of the left breast. EXAM: NUCLEAR MEDICINE WHOLE BODY BONE SCAN TECHNIQUE: Whole body anterior and posterior images were obtained approximately 3 hours after intravenous injection of radiopharmaceutical. RADIOPHARMACEUTICALS:  19 mCi Technetium-64mMDP IV COMPARISON:  None. FINDINGS: There is no evidence by bone scan of skeletal metastatic disease. Degenerative activity is noted in both AC joints, glenohumeral joints and hip joints. No visible abnormal soft tissue uptake. Normal excretion of activity is noted by the kidneys and into the bladder. IMPRESSION: No evidence of skeletal metastatic disease. Electronically Signed   By: GAletta EdouardM.D.   On: 02/12/2016 15:52   Ct Abdomen Pelvis W Contrast  Result Date: 02/12/2016 CLINICAL DATA:  Newly diagnosed left breast cancer EXAM: CT CHEST, ABDOMEN, AND PELVIS WITH CONTRAST TECHNIQUE: Multidetector CT imaging of the chest, abdomen and pelvis was performed following the standard protocol during bolus administration of intravenous contrast. CONTRAST:  1052mISOVUE-300 IOPAMIDOL (ISOVUE-300) INJECTION 61% COMPARISON:  None. FINDINGS: CT CHEST FINDINGS Cardiovascular: The heart is normal in size. No pericardial effusion. Right chest port terminates in the mid SVC. Mediastinum/Nodes:  No suspicious mediastinal lymphadenopathy. Left axillary lymphadenopathy measuring up to 13 mm short axis (series 2/image 18). Visualized thyroid is unremarkable. Lungs/Pleura:  Lungs  are clear. Mild linear scarring/atelectasis in the lingula and left lower lobe. No focal consolidation. No pleural effusion or pneumothorax. Musculoskeletal: 2.4 x 2.7 cm lesion in the upper outer left breast (series 2/image 25), corresponding to the patient's known breast neoplasm. Very mild degenerative changes of the thoracic spine. CT ABDOMEN PELVIS FINDINGS Hepatobiliary:  Liver is within normal limits. Gallbladder is within normal limits. No intrahepatic or extrahepatic ductal dilatation. Pancreas:  Within normal limits. Spleen:  Within normal limits. Adrenals/Urinary Tract:  Adrenal glands are within normal limits. Kidneys are within normal limits.  No hydronephrosis. Bladder is within normal limits. Stomach/Bowel:  Stomach is within normal limits. No evidence of bowel obstruction. Normal appendix. Vascular/Lymphatic:  No evidence of aortic aneurysm. No suspicious abdominopelvic lymphadenopathy. Reproductive:  Status post hysterectomy. Bilateral ovaries  are within normal limits. Other:  No pelvic ascites. Musculoskeletal: Very mild degenerative changes of the lumbar spine. IMPRESSION: 2.4 x 2.7 cm lesion in the upper outer left breast, corresponding to the patient's known breast neoplasm. Left axillary nodal metastasis measuring up to 13 mm short axis. No evidence of distant metastasis. Electronically Signed   By: Julian Hy M.D.   On: 02/12/2016 16:33   Mr Breast Bilateral W Wo Contrast  Result Date: 02/11/2016 CLINICAL DATA:  55 year old female with screening detected malignancy in the left breast. The patient had an ultrasound-guided biopsy of the left breast mass and a left axillary lymph node on 01/29/2016 indicating grade 3 invasive ductal carcinoma with lymphovascular involvement by tumor in the left breast at 1:30 and metastatic carcinoma of the biopsied left axillary lymph node. LABS:  Not applicable. EXAM: BILATERAL BREAST MRI WITH AND WITHOUT CONTRAST TECHNIQUE: Multiplanar,  multisequence MR images of both breasts were obtained prior to and following the intravenous administration of 19 ml of MultiHance. THREE-DIMENSIONAL MR IMAGE RENDERING ON INDEPENDENT WORKSTATION: Three-dimensional MR images were rendered by post-processing of the original MR data on an independent workstation. The three-dimensional MR images were interpreted, and findings are reported in the following complete MRI report for this study. Three dimensional images were evaluated at the independent DynaCad workstation COMPARISON:  No prior MRI available for comparison. Correlation made with prior ultrasounds and mammograms. FINDINGS: Breast composition: c. Heterogeneous fibroglandular tissue. Background parenchymal enhancement: Mild. Right breast: No mass or abnormal enhancement. Left breast: There is an enhancing mass with internal susceptibility consistent with the biopsy proven site of malignancy in the upper-outer left breast, measuring 3.3 x 2.6 x 2.6 cm. There are no other enhancing masses or suspicious non mass enhancement in the left breast. Lymph nodes: There are at least 3 partially visualized abnormal appearing lymph nodes in the left axilla. The largest of these has susceptibility artifact consistent with the biopsy marking clip, and measures 2.3 x 1.6 cm in axial plane. No abnormal right axillary lymph nodes nor bilateral internal mammary lymph nodes are identified. Ancillary findings:  None. IMPRESSION: 1. The biopsy-proven malignancy in the upper-outer left breast measures 3.3 cm by MRI. No additional sites of concern are identified in the left breast. 2. Left axillary lymphadenopathy, with at least 3 abnormal partially visualized lymph nodes identified, 1 of which was previously biopsied showing metastatic disease. 3.  No MRI evidence of malignancy in the right breast. RECOMMENDATION: Continue treatment plan. BI-RADS CATEGORY  6: Known biopsy-proven malignancy. Electronically Signed   By: Ammie Ferrier M.D.   On: 02/11/2016 10:37   Dg Chest Port 1 View  Result Date: 02/11/2016 CLINICAL DATA:  Port-A-Cath insertion. EXAM: PORTABLE CHEST 1 VIEW COMPARISON:  08/16/2015. FINDINGS: 1319 hours. There is a new right IJ Port-A-Cath, tip at the lower SVC level. There are lower lung volumes with resulting mild bibasilar atelectasis. No evidence of pneumothorax, significant pleural effusion or confluent airspace opacity. The heart size and mediastinal contours are stable. IMPRESSION: Port-A-Cath placement without demonstrated complicating pneumothorax. Mild bibasilar atelectasis. Electronically Signed   By: Richardean Sale M.D.   On: 02/11/2016 13:44   Dg Fluoro Guide Cv Line-no Report  Result Date: 02/11/2016 There is no Radiologist interpretation  for this exam.   ASSESSMENT & PLAN: 55 y.o. Caucasian woman with past medical history of depression, presented with a screening mammogram discovered left breast cancer   1. Malignant neoplasm of upper-outer quadrant of left breast, invasive ductal carcinoma, G3, cT2N1Mx,  stage IIB, ER-/PR-/HER2- -I reviewed her imaging findings and the biopsy results in great detail with patient and her husband. -I discussed her breast MRI, CT scan and bone scan findings, which all confirmed the left breast lesion and axillary adenopathy, no other distant metastasis. -she was seen by Dr. Donne Hazel and surgical options were discussed  -We discussed that triple negative breast cancers are much more aggressive, and her risk of cancer recurrence after breast surgery is pretty high, giving the multiple positive lymph nodes. I recommend her to consider neoadjuvant chemotherapy, to reduce her risk of recurrence, and downstage her breast cancer, to make lumpectomy and sentinel lymph node biopsy as a more feasible surgery. -I recommend her to have dose dense Adriamycin, Cytoxan every 2 weeks, with Neulasta support, for 4 cycles, followed by weekly carboplatin and Taxol for 12  weeks or carboplatin and docetaxel every 3 weeks for 4 cycles.  -the goal of therapy is curative  -Due to her underlying NOSD, and existing peripheral neuropathy, my main concern of long-term side effects from chemotherapy is worsening neuropathy, especially from Taxol or Taxotere, we will monitor closely, and may need to hold it if her neuropathy gets worse.  -I spoke with her neurologist Dr. Moshe Cipro who agrees with her chemo for breast cancer  -Her baseline echocardiogram was normal. -she tolerated fist cycle chemo well  -Lab reviewed, adequate for treatment, we'll proceed with 2nd cycle chemotherapy today  2. Genetics -Given her young age and triple negative disease, we recommend her to have genetic testing to ruled out inheritable breast cancer syndrome, and the test result may impact her surgery.  3. neuromyelitis optica spectrum disorder (NOSD) -she will continue follow up with her neurologist Dr. Moshe Cipro at [facility]  -she is on rituximab every 4 weeks, next due in March 21, 2016. It will be OK to continue her Rituximab when she is on chemotherapy, but the combination therapy will further compromise her immune system. -I have spoken with Dr. Moshe Cipro about her breast cancer treatment, he is agreeable. -Per pt, Dr. Moshe Cipro recommends her to get next cycle Rutuximab in our cancer center, I will arrange  -We discussed using Benadryl as an IV medication before getting Rituximab.  Plan -lab reviewed and proceed with 2nd cycle AC today  -lab, f/u and chemo in 2 weeks  -Genetic counseling scheduled on 03/18/16 -Rituximab infusion on 03/21/16. I will as Dr. Moshe Cipro to fax me the order    No orders of the defined types were placed in this encounter.   All questions were answered. The patient knows to call the clinic with any problems, questions or concerns.  I spent 20 minutes counseling the patient face to face. The total time spent in the appointment was 25 minutes and more than 50% was on  counseling.     Truitt Merle, MD 02/27/2016  This document serves as a record of services personally performed by Truitt Merle, MD. It was created on her behalf by Darcus Austin, a trained medical scribe. The creation of this record is based on the scribe's personal observations and the provider's statements to them. This document has been checked and approved by the attending provider.

## 2016-03-01 ENCOUNTER — Encounter: Payer: Self-pay | Admitting: Hematology

## 2016-03-12 ENCOUNTER — Ambulatory Visit: Payer: 59

## 2016-03-12 ENCOUNTER — Encounter: Payer: Self-pay | Admitting: Hematology

## 2016-03-12 ENCOUNTER — Ambulatory Visit (HOSPITAL_BASED_OUTPATIENT_CLINIC_OR_DEPARTMENT_OTHER): Payer: 59 | Admitting: Hematology

## 2016-03-12 ENCOUNTER — Ambulatory Visit (HOSPITAL_BASED_OUTPATIENT_CLINIC_OR_DEPARTMENT_OTHER): Payer: 59

## 2016-03-12 ENCOUNTER — Telehealth: Payer: Self-pay | Admitting: Hematology

## 2016-03-12 ENCOUNTER — Other Ambulatory Visit (HOSPITAL_BASED_OUTPATIENT_CLINIC_OR_DEPARTMENT_OTHER): Payer: 59

## 2016-03-12 VITALS — BP 123/70 | HR 71 | Temp 98.3°F | Resp 18 | Ht 63.0 in | Wt 200.5 lb

## 2016-03-12 DIAGNOSIS — C50412 Malignant neoplasm of upper-outer quadrant of left female breast: Secondary | ICD-10-CM

## 2016-03-12 DIAGNOSIS — Z5111 Encounter for antineoplastic chemotherapy: Secondary | ICD-10-CM

## 2016-03-12 DIAGNOSIS — G360 Neuromyelitis optica [Devic]: Secondary | ICD-10-CM

## 2016-03-12 DIAGNOSIS — Z171 Estrogen receptor negative status [ER-]: Secondary | ICD-10-CM | POA: Diagnosis not present

## 2016-03-12 DIAGNOSIS — Z95828 Presence of other vascular implants and grafts: Secondary | ICD-10-CM

## 2016-03-12 LAB — COMPREHENSIVE METABOLIC PANEL
ALT: 36 U/L (ref 0–55)
AST: 20 U/L (ref 5–34)
Albumin: 3.5 g/dL (ref 3.5–5.0)
Alkaline Phosphatase: 176 U/L — ABNORMAL HIGH (ref 40–150)
Anion Gap: 9 mEq/L (ref 3–11)
BUN: 9.3 mg/dL (ref 7.0–26.0)
CO2: 23 mEq/L (ref 22–29)
Calcium: 9.1 mg/dL (ref 8.4–10.4)
Chloride: 109 mEq/L (ref 98–109)
Creatinine: 0.7 mg/dL (ref 0.6–1.1)
EGFR: >90 mL/min/{1.73_m2} (ref >90)
Glucose: 103 mg/dl (ref 70–140)
Potassium: 4 mEq/L (ref 3.5–5.1)
Sodium: 141 mEq/L (ref 136–145)
Total Bilirubin: 0.34 mg/dL (ref 0.20–1.20)
Total Protein: 6.3 g/dL — ABNORMAL LOW (ref 6.4–8.3)

## 2016-03-12 LAB — CBC WITH DIFFERENTIAL/PLATELET
BASO%: 0.7 % (ref 0.0–2.0)
Basophils Absolute: 0 10*3/uL (ref 0.0–0.1)
EOS%: 0.3 % (ref 0.0–7.0)
Eosinophils Absolute: 0 10*3/uL (ref 0.0–0.5)
HCT: 34.5 % — ABNORMAL LOW (ref 34.8–46.6)
HGB: 11.9 g/dL (ref 11.6–15.9)
LYMPH%: 14.3 % (ref 14.0–49.7)
MCH: 30 pg (ref 25.1–34.0)
MCHC: 34.6 g/dL (ref 31.5–36.0)
MCV: 86.9 fL (ref 79.5–101.0)
MONO#: 0.5 10*3/uL (ref 0.1–0.9)
MONO%: 6.7 % (ref 0.0–14.0)
NEUT#: 5.4 10*3/uL (ref 1.5–6.5)
NEUT%: 78 % — ABNORMAL HIGH (ref 38.4–76.8)
Platelets: 216 10*3/uL (ref 145–400)
RBC: 3.97 10*6/uL (ref 3.70–5.45)
RDW: 13.2 % (ref 11.2–14.5)
WBC: 6.9 10*3/uL (ref 3.9–10.3)
lymph#: 1 10*3/uL (ref 0.9–3.3)

## 2016-03-12 MED ORDER — SODIUM CHLORIDE 0.9% FLUSH
10.0000 mL | INTRAVENOUS | Status: DC | PRN
  Administered 2016-03-12: 10 mL via INTRAVENOUS
  Filled 2016-03-12: qty 10

## 2016-03-12 MED ORDER — SODIUM CHLORIDE 0.9 % IV SOLN
Freq: Once | INTRAVENOUS | Status: AC
  Administered 2016-03-12: 10:00:00 via INTRAVENOUS
  Filled 2016-03-12: qty 5

## 2016-03-12 MED ORDER — DOXORUBICIN HCL CHEMO IV INJECTION 2 MG/ML
60.0000 mg/m2 | Freq: Once | INTRAVENOUS | Status: AC
  Administered 2016-03-12: 122 mg via INTRAVENOUS
  Filled 2016-03-12: qty 61

## 2016-03-12 MED ORDER — HEPARIN SOD (PORK) LOCK FLUSH 100 UNIT/ML IV SOLN
500.0000 [IU] | Freq: Once | INTRAVENOUS | Status: AC | PRN
  Administered 2016-03-12: 500 [IU]
  Filled 2016-03-12: qty 5

## 2016-03-12 MED ORDER — SODIUM CHLORIDE 0.9% FLUSH
10.0000 mL | INTRAVENOUS | Status: DC | PRN
  Administered 2016-03-12: 10 mL
  Filled 2016-03-12: qty 10

## 2016-03-12 MED ORDER — SODIUM CHLORIDE 0.9 % IV SOLN
600.0000 mg/m2 | Freq: Once | INTRAVENOUS | Status: AC
  Administered 2016-03-12: 1220 mg via INTRAVENOUS
  Filled 2016-03-12: qty 61

## 2016-03-12 MED ORDER — PALONOSETRON HCL INJECTION 0.25 MG/5ML
INTRAVENOUS | Status: AC
  Filled 2016-03-12: qty 5

## 2016-03-12 MED ORDER — PALONOSETRON HCL INJECTION 0.25 MG/5ML
0.2500 mg | Freq: Once | INTRAVENOUS | Status: AC
  Administered 2016-03-12: 0.25 mg via INTRAVENOUS

## 2016-03-12 MED ORDER — SODIUM CHLORIDE 0.9 % IV SOLN
Freq: Once | INTRAVENOUS | Status: AC
  Administered 2016-03-12: 10:00:00 via INTRAVENOUS

## 2016-03-12 MED ORDER — PEGFILGRASTIM 6 MG/0.6ML ~~LOC~~ PSKT
6.0000 mg | PREFILLED_SYRINGE | Freq: Once | SUBCUTANEOUS | Status: AC
  Administered 2016-03-12: 6 mg via SUBCUTANEOUS
  Filled 2016-03-12: qty 0.6

## 2016-03-12 NOTE — Patient Instructions (Signed)
[place] Discharge Instructions for Patients Receiving Chemotherapy  Today you received the following chemotherapy agents: Adriamycin and Cytoxan.  To help prevent nausea and vomiting after your treatment, we encourage you to take your nausea medication: Compazine. Take one every 6 hours as needed. For nausea on or after 1/13, take Zofran every 8 hours. If you develop nausea and vomiting that is not controlled by your nausea medication, call the clinic.   BELOW ARE SYMPTOMS THAT SHOULD BE REPORTED IMMEDIATELY:  *FEVER GREATER THAN 100.5 F  *CHILLS WITH OR WITHOUT FEVER  NAUSEA AND VOMITING THAT IS NOT CONTROLLED WITH YOUR NAUSEA MEDICATION  *UNUSUAL SHORTNESS OF BREATH  *UNUSUAL BRUISING OR BLEEDING  TENDERNESS IN MOUTH AND THROAT WITH OR WITHOUT PRESENCE OF ULCERS  *URINARY PROBLEMS  *BOWEL PROBLEMS  UNUSUAL RASH Items with * indicate a potential emergency and should be followed up as soon as possible.  Feel free to call the clinic should you have any questions or concerns. The clinic phone number is [phone].  Please show the [place] at check-in to the Emergency Department and triage nurse.

## 2016-03-12 NOTE — Progress Notes (Addendum)
[place]  [phone] [phone]) [phone]  Clinic Follow up Note   Patient Care Team: Maisie Fus, MD as PCP - General (Obstetrics and Gynecology) Collene Gobble, MD as Attending Physician (Psychiatry) Rolm Bookbinder, MD as Consulting Physician (General Surgery) Truitt Merle, MD as Consulting Physician (Hematology) Kyung Rudd, MD as Consulting Physician (Radiation Oncology) 03/12/2016   CHIEF COMPLAINTS:  Follow up left breast triple negative cancer  Oncology History   Malignant neoplasm of upper-outer quadrant of left female breast [facility])   Staging form: Breast, AJCC 7th Edition   - Clinical stage from 01/29/2016: Stage IIB (T2, N1, M0) - Signed by Truitt Merle, MD on 02/06/2016      Malignant neoplasm of upper-outer quadrant of left female breast ([place])   01/28/2016 Mammogram    Diagnostic MM and US showed a 2.9cm (3.2X2.2X2.5cm on [place]) mass in [place], multiple enlarged left axillary nodes, largest 2.5cm.       01/29/2016 Initial Diagnosis    Malignant neoplasm of upper-outer quadrant of left female breast ([place])      01/29/2016 Initial Biopsy    Left breast mass and axillary node biopsy showed IDC, G3,       01/29/2016 Receptors her2    ER-, PR-, HER2-, Ki67 85%      02/11/2016 Imaging    Bilateral breast MRI with and without contrast showed a 3.3 x 2.6 x 2.6 cm mass in the upper outer left breast, and left axillary lymphadenopathy, at least 3 abnormal lymph nodes, no other additional sites of concern.      02/12/2016 Imaging    CT chest, abdomen and pelvis with contrast showed a 2.4 x 2.7 cm lesion in the upper outer left breast, left axillary node metastasis measuring up to 1.3 cm, no evidence of distant metastasis.      02/12/2016 Imaging    Bone scan was negative for skeletal metastasis.      02/13/2016 -  Chemotherapy    Dose dense Adriamycin 60 mg/m, Cytoxan 600 mg/m, every 2 weeks, for 4 cycles, followed by weekly carboplatin and  Taxol for 12 weeks       HISTORY OF PRESENTING ILLNESS:  Grace Moyer 56 y.o. female is here because of her recently diagnosed left breast cancer. She was accompanied By her husband to our multidisciplinary breast clinic today.  This was discovered by screening mammogram, she had no palpable breast mass or axilla note. She denies any other new symptoms. She underwent a diagnostic mammogram and ultrasound on 01/28/2016, which showed a 2.9 cm mass in the upper outer quadrant, and multiple enlarged left axillary nodes, largest 2.5 cm. She underwent core needle biopsy of the left breast mass and left axillary node, both showed invasive ductal carcinoma, grade 3, triple negative, Ki-67 85%.  She developed bilateral numbness and tingling of her legs and hands at the end of May 2017. She underwent a multiple scans, and lab test, and was finally seen by neurologist Dr. Moshe Cipro, and was diagnosed with neuromyelitis optica spectrum disorder (NOSD). She initially received high-dose steroids, and then started Rituxan infusion, status post 2 infusions, now is on every four-month schedule, next due in Jan 2018. She states her neuropathy has been stable, she denies any significant vision problem, or other neurological symptoms. Her hand function and gait are normal. She is an Clinical biochemist.   GYN HISTORY  Menarchal: 13 LMP: 71 (hysterectomy) Contraceptive: 3 years  HRT: n/a  G1P1: 41 yo daughter   CURRENT THERAPY:  Dose dense Adriamycin 60 mg/m, Cytoxan 600 mg/m, every 2 weeks, for 4 cycles, followed by weekly carboplatin and Taxol for 12 weeks started 02/13/2016  INTERIM HISTORY Mrs Dacy returns for follow-up and her third cycle of chemotherapy. She is doing well overall, had moderate fatigue for a few days after chemo, no significant nausea, diarrhea, or other side effects. Her appetite and energy level have been remained well, she continues working full-time. Her peripheral neuropathy is stable overall.  No other new neurological symptoms.  MEDICAL HISTORY:  Past Medical History:  Diagnosis Date  . Anxiety   . Malignant neoplasm of upper-outer quadrant of left female breast ([place]) 01/31/2016  . Neuromyelitis optica ([place])     SURGICAL HISTORY: Past Surgical History:  Procedure Laterality Date  . ABDOMINAL HYSTERECTOMY     partial  . EYE SURGERY    . PORTACATH PLACEMENT Right 02/11/2016   Procedure: INSERTION PORT-A-CATH WITH [place];  Surgeon: Rolm Bookbinder, MD;  Location: [facility];  Service: General;  Laterality: Right;    SOCIAL HISTORY: Social History   Social History  . Marital status: Married    Spouse name: N/A  . Number of children: N/A  . Years of education: N/A   Occupational History  . Not on file.   Social History Main Topics  . Smoking status: Former Smoker    Types: Cigarettes    Quit date: 09/03/2001  . Smokeless tobacco: Never Used  . Alcohol use No  . Drug use: No  . Sexual activity: Not on file   Other Topics Concern  . Not on file   Social History Narrative  . No narrative on file    FAMILY HISTORY: Family History  Problem Relation Age of Onset  . Cancer Maternal Grandmother 15  . CAD Paternal Grandmother   . Colon cancer Paternal Uncle     ALLERGIES:  has No Known Allergies.  MEDICATIONS:  Current Outpatient Prescriptions  Medication Sig Dispense Refill  . Cholecalciferol (VITAMIN D-1000 MAX ST) 1000 units tablet Take 2 tablets by mouth daily.    Marland Kitchen HYDROcodone-acetaminophen (NORCO) 10-325 MG tablet Take 1 tablet by mouth every 6 (six) hours as needed. 10 tablet 0  . lidocaine-prilocaine (EMLA) cream Apply to affected area once 30 g 3  . omeprazole (PRILOSEC) 40 MG capsule Take 40 mg by mouth daily.    . ondansetron (ZOFRAN) 8 MG tablet Take 1 tablet (8 mg total) by mouth 2 (two) times daily as needed. Start on the third day after chemotherapy. 30 tablet 1  . prochlorperazine (COMPAZINE) 10 MG tablet Take 1 tablet (10 mg  total) by mouth every 6 (six) hours as needed (Nausea or vomiting). 30 tablet 1  . riTUXimab (RITUXAN) 100 MG/10ML injection Inject into the vein every 4 (four) months.     No current facility-administered medications for this visit.     REVIEW OF SYSTEMS:   Constitutional: Denies fevers, chills or abnormal night sweats Eyes: Denies blurriness of vision, double vision or watery eyes Ears, nose, mouth, throat, and face: Denies mucositis or sore throat Respiratory: Denies cough, dyspnea or wheezes Cardiovascular: Denies palpitation, chest discomfort or lower extremity swelling Gastrointestinal:  Denies nausea, heartburn or change in bowel habits Skin: Denies abnormal skin rashes Lymphatics: Denies new lymphadenopathy or easy bruising Neurological:Denies numbness, tingling or new weaknesses Behavioral/Psych: Mood is stable, no new changes  Musculoskeletal: (+) Neck and swallowing pain. All other systems were reviewed with the patient and are negative.  PHYSICAL EXAMINATION: ECOG  PERFORMANCE STATUS: 1 - Symptomatic but completely ambulatory  Vitals:   03/12/16 0934  BP: 123/70  Pulse: 71  Resp: 18  Temp: 98.3 F (36.8 C)   Filed Weights   03/12/16 0934  Weight: 200 lb 8 oz (90.9 kg)    GENERAL:alert, no distress and comfortable SKIN: skin color, texture, turgor are normal, no rashes or significant lesions EYES: normal, conjunctiva are pink and non-injected, sclera clear OROPHARYNX:no exudate, no erythema and lips, buccal mucosa, and tongue normal  NECK: supple, thyroid normal size, non-tender, without nodularity. (+) Incision in the right lower neck for port. Port appears clean, neck not significantly swollen, no erythema, no sign of infection. LYMPH:  no palpable lymphadenopathy in the cervical, axillary or inguinal LUNGS: clear to auscultation and percussion with normal breathing effort HEART: regular rate & rhythm and no murmurs and no lower extremity edema ABDOMEN:abdomen  soft, non-tender and normal bowel sounds MUSCULOSKELETALl:no cyanosis of digits and no clubbing  PSYCH: alert & oriented x 3 with fluent speech NEURO: no focal motor/sensory deficits Breasts: Breast inspection showed them to be symmetrical with no nipple discharge. Palpation of the breasts and axilla revealed no obvious mass that I could appreciate, there is fullness at the upper-out quadrant of left breast.    LABORATORY DATA:  I have reviewed the data as listed CBC Latest Ref Rng & Units 03/12/2016 02/27/2016 02/13/2016  WBC 3.9 - 10.3 10e3/uL 6.9 9.2 6.6  Hemoglobin 11.6 - 15.9 g/dL 11.9 12.7 13.8  Hematocrit 34.8 - 46.6 % 34.5(L) 37.4 41.4  Platelets 145 - 400 10e3/uL 216 177 217   CMP Latest Ref Rng & Units 03/12/2016 02/27/2016 02/13/2016  Glucose 70 - 140 mg/dl 103 122 86  BUN 7.0 - 26.0 mg/dL 9.3 8.0 11.9  Creatinine 0.6 - 1.1 mg/dL 0.7 0.8 0.8  Sodium 136 - 145 mEq/L 141 141 143  Potassium 3.5 - 5.1 mEq/L 4.0 3.8 3.9  Chloride 101 - 111 mmol/L - - -  CO2 22 - 29 mEq/L _0 Calcium 8.4 - 10.4 mg/dL 9.1 8.9 9.3  Total Protein 6.4 - 8.3 g/dL 6.3(L) 6.5 6.8  Total Bilirubin 0.20 - 1.20 mg/dL 0.34 0.32 0.43  Alkaline Phos 40 - 150 U/L 176(H) 175(H) 82  AST 5 - 34 U/L _1 ALT 0 - 55 U/L 36 69(H) 22   PATHOLOGY REPORT  Diagnosis 01/29/2016 1. Breast, left, needle core biopsy, 1:30 o'clock - INVASIVE DUCTAL CARCINOMA, GRADE 3. - LYMPHOVASCULAR INVOLVEMENT BY TUMOR. 2. Lymph node, needle/core biopsy, left axillary - METASTATIC CARCINOMA. Microscopic Comment 1. , 2. A breast prognostic profile will be performed  1. Results: IMMUNOHISTOCHEMICAL AND MORPHOMETRIC ANALYSIS PERFORMED MANUALLY Estrogen Receptor: 0%, NEGATIVE Progesterone Receptor: 0%, NEGATIVE Proliferation Marker Ki67: 85%  Results: HER2 - NEGATIVE RATIO OF HER2/CEP17 SIGNALS 1.26 AVERAGE HER2 COPY NUMBER PER CELL 1.95  2. FLUORESCENCE IN-SITU HYBRIDIZATION Results: HER2 - NEGATIVE RATIO OF  HER2/CEP17 SIGNALS 1.25 AVERAGE HER2 COPY NUMBER PER CELL 2.00  Results: IMMUNOHISTOCHEMICAL AND MORPHOMETRIC ANALYSIS PERFORMED MANUALLY Estrogen Receptor: 0%, NEGATIVE Progesterone Receptor: <1%, NEGATIVE, WEAK STAINING INTENSITY Proliferation Marker Ki67: 90%  RADIOGRAPHIC STUDIES: I have personally reviewed the radiological images as listed and agreed with the findings in the report. Ct Chest W Contrast  Result Date: 02/12/2016 CLINICAL DATA:  Newly diagnosed left breast cancer EXAM: CT CHEST, ABDOMEN, AND PELVIS WITH CONTRAST TECHNIQUE: Multidetector CT imaging of the chest, abdomen and pelvis was performed following the standard protocol during bolus administration of  intravenous contrast. CONTRAST:  ISOVUE-300 IOPAMIDOL (ISOVUE-300) INJECTION 61% COMPARISON:  None. FINDINGS: CT CHEST FINDINGS Cardiovascular: The heart is normal in size. No pericardial effusion. Right chest port terminates in the mid SVC. Mediastinum/Nodes:  No suspicious mediastinal lymphadenopathy. Left axillary lymphadenopathy measuring up to 13 mm short axis (series 2/image 18). Visualized thyroid is unremarkable. Lungs/Pleura:  Lungs are clear. Mild linear scarring/atelectasis in the lingula and left lower lobe. No focal consolidation. No pleural effusion or pneumothorax. Musculoskeletal: 2.4 x 2.7 cm lesion in the upper outer left breast (series 2/image 25), corresponding to the patient's known breast neoplasm. Very mild degenerative changes of the thoracic spine. CT ABDOMEN PELVIS FINDINGS Hepatobiliary:  Liver is within normal limits. Gallbladder is within normal limits. No intrahepatic or extrahepatic ductal dilatation. Pancreas:  Within normal limits. Spleen:  Within normal limits. Adrenals/Urinary Tract:  Adrenal glands are within normal limits. Kidneys are within normal limits.  No hydronephrosis. Bladder is within normal limits. Stomach/Bowel:  Stomach is within normal limits. No evidence of bowel obstruction.  Normal appendix. Vascular/Lymphatic:  No evidence of aortic aneurysm. No suspicious abdominopelvic lymphadenopathy. Reproductive:  Status post hysterectomy. Bilateral ovaries are within normal limits. Other:  No pelvic ascites. Musculoskeletal: Very mild degenerative changes of the lumbar spine. IMPRESSION: 2.4 x 2.7 cm lesion in the upper outer left breast, corresponding to the patient's known breast neoplasm. Left axillary nodal metastasis measuring up to 13 mm short axis. No evidence of distant metastasis. Electronically Signed   By: Charline Bills M.D.   On: 02/12/2016 16:33   Nm Bone Scan Whole Body  Result Date: 02/12/2016 CLINICAL DATA:  Newly diagnosed invasive carcinoma of the left breast. EXAM: NUCLEAR MEDICINE WHOLE BODY BONE SCAN TECHNIQUE: Whole body anterior and posterior images were obtained approximately 3 hours after intravenous injection of radiopharmaceutical. RADIOPHARMACEUTICALS:  19 mCi Technetium-89m MDP IV COMPARISON:  None. FINDINGS: There is no evidence by bone scan of skeletal metastatic disease. Degenerative activity is noted in both AC joints, glenohumeral joints and hip joints. No visible abnormal soft tissue uptake. Normal excretion of activity is noted by the kidneys and into the bladder. IMPRESSION: No evidence of skeletal metastatic disease. Electronically Signed   By: Irish Lack M.D.   On: 02/12/2016 15:52   Ct Abdomen Pelvis W Contrast  Result Date: 02/12/2016 CLINICAL DATA:  Newly diagnosed left breast cancer EXAM: CT CHEST, ABDOMEN, AND PELVIS WITH CONTRAST TECHNIQUE: Multidetector CT imaging of the chest, abdomen and pelvis was performed following the standard protocol during bolus administration of intravenous contrast. CONTRAST:  ISOVUE-300 IOPAMIDOL (ISOVUE-300) INJECTION 61% COMPARISON:  None. FINDINGS: CT CHEST FINDINGS Cardiovascular: The heart is normal in size. No pericardial effusion. Right chest port terminates in the mid SVC.  Mediastinum/Nodes:  No suspicious mediastinal lymphadenopathy. Left axillary lymphadenopathy measuring up to 13 mm short axis (series 2/image 18). Visualized thyroid is unremarkable. Lungs/Pleura:  Lungs are clear. Mild linear scarring/atelectasis in the lingula and left lower lobe. No focal consolidation. No pleural effusion or pneumothorax. Musculoskeletal: 2.4 x 2.7 cm lesion in the upper outer left breast (series 2/image 25), corresponding to the patient's known breast neoplasm. Very mild degenerative changes of the thoracic spine. CT ABDOMEN PELVIS FINDINGS Hepatobiliary:  Liver is within normal limits. Gallbladder is within normal limits. No intrahepatic or extrahepatic ductal dilatation. Pancreas:  Within normal limits. Spleen:  Within normal limits. Adrenals/Urinary Tract:  Adrenal glands are within normal limits. Kidneys are within normal limits.  No hydronephrosis. Bladder is within normal limits. Stomach/Bowel:  Stomach is within normal limits. No evidence of bowel obstruction. Normal appendix. Vascular/Lymphatic:  No evidence of aortic aneurysm. No suspicious abdominopelvic lymphadenopathy. Reproductive:  Status post hysterectomy. Bilateral ovaries are within normal limits. Other:  No pelvic ascites. Musculoskeletal: Very mild degenerative changes of the lumbar spine. IMPRESSION: 2.4 x 2.7 cm lesion in the upper outer left breast, corresponding to the patient's known breast neoplasm. Left axillary nodal metastasis measuring up to 13 mm short axis. No evidence of distant metastasis. Electronically Signed   By: Julian Hy M.D.   On: 02/12/2016 16:33    ASSESSMENT & PLAN: 56 y.o. Caucasian woman with past medical history of depression, presented with a screening mammogram discovered left breast cancer   1. Malignant neoplasm of upper-outer quadrant of left breast, invasive ductal carcinoma, G3, cT2N1M0, stage IIB, ER-/PR-/HER2- -I reviewed her imaging findings and the biopsy results in great  detail with patient and her husband. -I discussed her breast MRI, CT scan and bone scan findings, which all confirmed the left breast lesion and axillary adenopathy, no other distant metastasis. -she was seen by Dr. Donne Hazel and surgical options were discussed  -We discussed that triple negative breast cancers are much more aggressive, and her risk of cancer recurrence after breast surgery is pretty high, giving the multiple positive lymph nodes. I recommend her to consider neoadjuvant chemotherapy, to reduce her risk of recurrence, and downstage her breast cancer, to make lumpectomy and sentinel lymph node biopsy as a more feasible surgery. -I recommend her to have dose dense Adriamycin, Cytoxan every 2 weeks, with Neulasta support, for 4 cycles, followed by weekly carboplatin and Taxol for 12 weeks or carboplatin and docetaxel every 3 weeks for 4 cycles.  -the goal of therapy is curative  -Due to her underlying NOSD, and existing peripheral neuropathy, my main concern of long-term side effects from chemotherapy is worsening neuropathy, especially from Taxol or Taxotere, we will monitor closely, and may need to hold it if her neuropathy gets worse.  -Her baseline echocardiogram was normal. -she tolerated fist two cycles chemo well  -Lab reviewed, adequate for treatment, we'll proceed with 3rd cycle chemotherapy today  2. Genetics -Given her young age and triple negative disease, we recommend her to have genetic testing to ruled out inheritable breast cancer syndrome, and the test result may impact her surgery.  3. neuromyelitis optica spectrum disorder (NOSD) -she will continue follow up with her neurologist Dr. Moshe Cipro at [facility]  -she is on rituximab every 4 weeks, next due in March 21, 2016. It will be OK to continue her Rituximab when she is on chemotherapy, but the combination therapy will further compromise her immune system. -I have spoken with Dr. Moshe Cipro about her breast cancer treatment, he  is agreeable. -Per pt, Dr. Moshe Cipro recommends her to get next cycle Rutuximab in our cancer center, I will arrange  -We discussed using Benadryl as an IV medication before getting Rituximab.  Plan -lab reviewed and proceed with 3rd cycle AC today  -lab, f/u and chemo in 2 weeks  -Genetic counseling scheduled on 03/18/16 -Rituximab infusion is scheduled for 03/21/16. I will ask Dr. Moshe Cipro to fax me the order, my nurse will call today or tomorrow    No orders of the defined types were placed in this encounter.   All questions were answered. The patient knows to call the clinic with any problems, questions or concerns.  I spent 15 minutes counseling the patient face to face. The total time spent in the  appointment was 20 minutes and more than 50% was on counseling.     Truitt Merle, MD 03/12/2016   Addendum I have received and reviewed Dr. Rosemarie Ax due to some Odor. She received induction dose 1000 mg on 10/29/15 and 11/12/15, then changed to maintenance dose of 500 mg every 3 months, from 12/10/2015. So I'll give rituximab 500 mg flat dose on March 21 2016, with premedication of Tylenol and IV Benadryl.   Truitt Merle  03/13/2016

## 2016-03-12 NOTE — Telephone Encounter (Signed)
GAVE PATIENT AVS REPORT AND APPOINTMENTS FOR January  °

## 2016-03-12 NOTE — Patient Instructions (Signed)
Central Lines A central line is a thin tube (catheter) that is put in your vein to give you medicine or food. It may also be used to take blood for lab tests. There are different types of central lines. The type of central line you receive depends on how long it will be needed, your medical condition, and the condition of your veins.  HOME CARE   Follow your doctor's instructions for the type of central line that you have.  Change bandages as told by your doctor. Look for redness or irritation during a bandage change. If a bandage gets wet, have it changed right away.  Keep the area where the central line goes into your skin clean at all times.   Use saline solution to clear out (flush) your central line as told by your doctor.  Always wash your hands before changing bandages or rinsing out the central line.  If the central line accidentally gets pulled on, make sure that:  The bandage is okay.  There is no bleeding.  The line has not been pulled out.   Limit lifting, using your arm, or doing other activity as told by your doctor.   Swim or bathe only if your doctor approves. Your doctor can tell you how to keep your specific type of bandage from getting wet.   GET HELP RIGHT AWAY IF:   You have chills.  You have a fever.   You have shortness of breath or trouble breathing.   You have chest pain.   You feel your heart beating fast or "skipping" beats.   You feel dizzy or faint.  You have bleeding that does not stop from the site of the central line.   You have pain, redness, puffiness (swelling), or drainage at the site of the central line.   You have swelling in your neck, face, chest, or arm on the side of your central line.   Your central line is hard to rinse out.  You do not get a blood return from the central line.  You have a hole or tear in your central line.   Your central line leaks when rinsed out or when fluids are put into it. MAKE  SURE YOU:   Understand these instructions.   Will watch your condition.   Will get help right away if you are not doing well or get worse.  This information is not intended to replace advice given to you by your health care provider. Make sure you discuss any questions you have with your health care provider. Document Released: 02/04/2012 Document Revised: 06/11/2015 Document Reviewed: 02/04/2012 Elsevier Interactive Patient Education  2017 [employer]  

## 2016-03-13 NOTE — Addendum Note (Signed)
Addended by: Truitt Merle on: 03/13/2016 03:07 PM   Modules accepted: Orders

## 2016-03-14 ENCOUNTER — Telehealth: Payer: Self-pay | Admitting: Genetic Counselor

## 2016-03-14 NOTE — Telephone Encounter (Signed)
Pt confirmed reschedule with genetics to 01/15 and spoke with Social Workers regarding updating appt change to complete paperwork with them.

## 2016-03-17 ENCOUNTER — Other Ambulatory Visit: Payer: 59

## 2016-03-17 ENCOUNTER — Encounter: Payer: Self-pay | Admitting: *Deleted

## 2016-03-17 ENCOUNTER — Encounter: Payer: Self-pay | Admitting: Genetic Counselor

## 2016-03-17 ENCOUNTER — Ambulatory Visit (HOSPITAL_BASED_OUTPATIENT_CLINIC_OR_DEPARTMENT_OTHER): Payer: 59 | Admitting: Genetic Counselor

## 2016-03-17 DIAGNOSIS — Z803 Family history of malignant neoplasm of breast: Secondary | ICD-10-CM | POA: Diagnosis not present

## 2016-03-17 DIAGNOSIS — Z315 Encounter for genetic counseling: Secondary | ICD-10-CM | POA: Diagnosis not present

## 2016-03-17 DIAGNOSIS — Z171 Estrogen receptor negative status [ER-]: Secondary | ICD-10-CM

## 2016-03-17 DIAGNOSIS — C50412 Malignant neoplasm of upper-outer quadrant of left female breast: Secondary | ICD-10-CM

## 2016-03-17 DIAGNOSIS — Z800 Family history of malignant neoplasm of digestive organs: Secondary | ICD-10-CM | POA: Diagnosis not present

## 2016-03-17 NOTE — Progress Notes (Signed)
REFERRING PROVIDER: Maisie Fus, MD [place], [place] [address]   Truitt Merle, MD  PRIMARY PROVIDER:  Maisie Fus, MD  PRIMARY REASON FOR VISIT:  1. Malignant neoplasm of upper-outer quadrant of left breast in female, estrogen receptor negative ([place])   2. Family history of breast cancer   3. Family history of colon cancer      HISTORY OF PRESENT ILLNESS:   Grace Moyer, a 56 y.o. female, was seen for a [place] cancer genetics consultation at the request of Dr. Burr Medico due to a personal and family history of cancer.  Grace Moyer presents to clinic today to discuss the possibility of a hereditary predisposition to cancer, genetic testing, and to further clarify her future cancer risks, as well as potential cancer risks for family members.   In October 2017, at the age of 80, Grace Moyer was diagnosed with triple negative breast cancer. This was treated with chemotherapy, her surgery will be dependant on her genetic testing.  Prior to being diagnosed with breast cancer she was diagnosed with Neuromyelitis optica, an autoimmune disorder.  She is currently being treated for this by taking a medication that suppresses her B cells.   CANCER HISTORY:  Oncology History   Malignant neoplasm of upper-outer quadrant of left female breast [facility])   Staging form: Breast, AJCC 7th Edition   - Clinical stage from 01/29/2016: Stage IIB (T2, N1, M0) - Signed by Truitt Merle, MD on 02/06/2016      Malignant neoplasm of upper-outer quadrant of left female breast ([place])   01/28/2016 Mammogram    Diagnostic MM and US showed a 2.9cm (3.2X2.2X2.5cm on [place]) mass in UOQ, multiple enlarged left axillary nodes, largest 2.5cm.       01/29/2016 Initial Diagnosis    Malignant neoplasm of upper-outer quadrant of left female breast ([place])      01/29/2016 Initial Biopsy    Left breast mass and axillary node biopsy showed IDC, G3,       01/29/2016 Receptors her2    ER-, PR-, HER2-, Ki67 85%       02/11/2016 Imaging    Bilateral breast MRI with and without contrast showed a 3.3 x 2.6 x 2.6 cm mass in the upper outer left breast, and left axillary lymphadenopathy, at least 3 abnormal lymph nodes, no other additional sites of concern.      02/12/2016 Imaging    CT chest, abdomen and pelvis with contrast showed a 2.4 x 2.7 cm lesion in the upper outer left breast, left axillary node metastasis measuring up to 1.3 cm, no evidence of distant metastasis.      02/12/2016 Imaging    Bone scan was negative for skeletal metastasis.      02/13/2016 -  Chemotherapy    Dose dense Adriamycin 60 mg/m, Cytoxan 600 mg/m, every 2 weeks, for 4 cycles, followed by weekly carboplatin and Taxol for 12 weeks         HORMONAL RISK FACTORS:  Menarche was at age 82.  First live birth at age 56.  OCP use for approximately 3 years.  Ovaries intact: yes.  Hysterectomy: yes.  Menopausal status: postmenopausal.  HRT use: 0 years. Colonoscopy: yes; normal. Mammogram within the last year: yes. Number of breast biopsies: 1. Up to date with pelvic exams:  yes. Any excessive radiation exposure in the past:  no  Past Medical History:  Diagnosis Date  . Anxiety   . Family history of breast cancer   .  Family history of colon cancer   . Malignant neoplasm of upper-outer quadrant of left female breast ([place]) 01/31/2016  . Neuromyelitis optica ([place])     Past Surgical History:  Procedure Laterality Date  . ABDOMINAL HYSTERECTOMY     partial  . EYE SURGERY    . PORTACATH PLACEMENT Right 02/11/2016   Procedure: INSERTION PORT-A-CATH WITH [place];  Surgeon: Rolm Bookbinder, MD;  Location: [facility];  Service: General;  Laterality: Right;    Social History   Social History  . Marital status: Married    Spouse name: N/A  . Number of children: N/A  . Years of education: N/A   Social History Main Topics  . Smoking status: Former Smoker    Types: Cigarettes    Quit date:  09/03/2001  . Smokeless tobacco: Never Used  . Alcohol use No  . Drug use: No  . Sexual activity: Not Asked   Other Topics Concern  . None   Social History Narrative  . None     FAMILY HISTORY:  We obtained a detailed, 4-generation family history.  Significant diagnoses are listed below: Family History  Problem Relation Age of Onset  . Breast cancer Maternal Grandmother     dx in her 32s  . CAD Paternal Grandmother   . Colon cancer Paternal Uncle     dx in his 33s  . COPD Maternal Grandfather   . CAD Paternal Grandfather   . Breast cancer Cousin     dx in her early to mid 88s; maternal first cousin  . Lung cancer Paternal Uncle     The patient has one daughter and one sister who are both cancer free.  Her parents are both living and have never had cancer.  Her mother has two brothers and one sister who are all cancer free.  One brother has a daughter who had breast cancer in her 58's.  The patient's maternal grandmother had breast cancer in her 77's, and her grandfather died of COPD.  The patient's father had 10 siblings.  One brother had colon cancer in his 48's and a brother had lung cancer but there is no other cancer history on this side of the family.  Grace Moyer is unaware of previous family history of genetic testing for hereditary cancer risks. Patient's maternal ancestors are of Caucasian descent, and paternal ancestors are of [place] descent. There is no reported Ashkenazi Jewish ancestry. There is no known consanguinity.  GENETIC COUNSELING ASSESSMENT: Grace Moyer is a 56 y.o. female with a personal and family history of breast cancer which is somewhat suggestive of a hereditary cancer syndrome and predisposition to cancer. We, therefore, discussed and recommended the following at today's visit.   DISCUSSION: We discussed that 5-10% of breast cancer is hereditary with most cases due to BRCA mutations.  Other genes associated with hereditary breast cancer include  ATM, CHEK2, and PALB2.  We reviewed the characteristics, features and inheritance patterns of hereditary cancer syndromes. We also discussed genetic testing, including the appropriate family members to test, the process of testing, insurance coverage and turn-around-time for results. We discussed the implications of a negative, positive and/or variant of uncertain significant result. We recommended Grace Moyer pursue genetic testing for the Breast/Gyn gene panel. The Breast/GYN gene panel offered by [facility] includes sequencing and rearrangement analysis for the following 23 genes:  ATM, BARD1, BRCA1, BRCA2, BRIP1, CDH1, CHEK2, EPCAM, FANCC, MLH1, MSH2, MSH6, MUTYH, NBN, NF1, PALB2, PMS2, POLD1, PTEN, RAD51C, RAD51D, RECQL,  and TP53.     Based on Grace Moyer's personal and family history of cancer, she meets medical criteria for genetic testing. Despite that she meets criteria, she may still have an out of pocket cost. We discussed that if her out of pocket cost for testing is over $100, the laboratory will call and confirm whether she wants to proceed with testing.  If the out of pocket cost of testing is less than $100 she will be billed by the genetic testing laboratory.   PLAN: After considering the risks, benefits, and limitations, Grace Moyer  provided informed consent to pursue genetic testing and the blood sample was sent to [facility] for analysis of the [employer] Results should be available within approximately 2-3 weeks' time, at which point they will be disclosed by telephone to Grace Moyer, as will any additional recommendations warranted by these results. Grace Moyer will receive a summary of her genetic counseling visit and a copy of her results once available. This information will also be available in Epic. We encouraged Grace Moyer to remain in contact with cancer genetics annually so that we can continuously update the family history and inform her of any changes in cancer genetics  and testing that may be of benefit for her family. Grace Moyer questions were answered to her satisfaction today. Our contact information was provided should additional questions or concerns arise.  Lastly, we encouraged Grace Moyer to remain in contact with cancer genetics annually so that we can continuously update the family history and inform her of any changes in cancer genetics and testing that may be of benefit for this family.   Ms.  Moyer questions were answered to her satisfaction today. Our contact information was provided should additional questions or concerns arise. Thank you for the referral and allowing [place] to share in the care of your patient.   Elinor Kleine P. Florene [place], [place], [facility] Certified Genetic Counselor [email or web address] phone: [phone]  The patient was seen for a total of 60 minutes in face-to-face genetic counseling.  This patient was discussed with Drs. Magrinat, Lindi Adie and/or Burr Medico who agrees with the above.    _______________________________________________________________________ For Office Staff:  Number of people involved in session: 1 Was an Intern/ student involved with case: no

## 2016-03-17 NOTE — Progress Notes (Signed)
[employer] Social Work  Clinical Social Work was referred by patient to review and complete healthcare advance directives.  Clinical Social Worker met with patient in [place] office.  The patient designated Prudy Candy as their primary healthcare agent and Winslow Verrill as their secondary agent.  Patient also completed healthcare living will.    Clinical Social Worker notarized documents and made copies for patient/family. Clinical Social Worker will send documents to medical records to be scanned into patient's chart. Clinical Social Worker encouraged patient/family to contact with any additional questions or concerns.  Johnnye Lana, MSW, LCSW, OSW-C Clinical Social Worker [facility] [phone]

## 2016-03-18 ENCOUNTER — Other Ambulatory Visit: Payer: 59

## 2016-03-18 ENCOUNTER — Encounter: Payer: 59 | Admitting: Genetic Counselor

## 2016-03-19 DIAGNOSIS — Z803 Family history of malignant neoplasm of breast: Secondary | ICD-10-CM | POA: Diagnosis not present

## 2016-03-19 DIAGNOSIS — C50412 Malignant neoplasm of upper-outer quadrant of left female breast: Secondary | ICD-10-CM | POA: Diagnosis not present

## 2016-03-21 ENCOUNTER — Ambulatory Visit (HOSPITAL_BASED_OUTPATIENT_CLINIC_OR_DEPARTMENT_OTHER): Payer: 59

## 2016-03-21 VITALS — BP 114/66 | HR 86 | Temp 99.2°F | Resp 18

## 2016-03-21 DIAGNOSIS — Z5112 Encounter for antineoplastic immunotherapy: Secondary | ICD-10-CM

## 2016-03-21 DIAGNOSIS — G360 Neuromyelitis optica [Devic]: Secondary | ICD-10-CM | POA: Diagnosis not present

## 2016-03-21 MED ORDER — ACETAMINOPHEN 325 MG PO TABS
ORAL_TABLET | ORAL | Status: AC
  Filled 2016-03-21: qty 2

## 2016-03-21 MED ORDER — SODIUM CHLORIDE 0.9% FLUSH
10.0000 mL | INTRAVENOUS | Status: DC | PRN
  Administered 2016-03-21: 10 mL
  Filled 2016-03-21: qty 10

## 2016-03-21 MED ORDER — SODIUM CHLORIDE 0.9 % IV SOLN
500.0000 mg | Freq: Once | INTRAVENOUS | Status: AC
  Administered 2016-03-21: 500 mg via INTRAVENOUS
  Filled 2016-03-21: qty 50

## 2016-03-21 MED ORDER — ACETAMINOPHEN 325 MG PO TABS
650.0000 mg | ORAL_TABLET | Freq: Once | ORAL | Status: AC
  Administered 2016-03-21: 650 mg via ORAL

## 2016-03-21 MED ORDER — DIPHENHYDRAMINE HCL 50 MG/ML IJ SOLN
INTRAMUSCULAR | Status: AC
  Filled 2016-03-21: qty 1

## 2016-03-21 MED ORDER — DIPHENHYDRAMINE HCL 50 MG/ML IJ SOLN
25.0000 mg | Freq: Once | INTRAMUSCULAR | Status: AC
  Administered 2016-03-21: 25 mg via INTRAVENOUS

## 2016-03-21 MED ORDER — HEPARIN SOD (PORK) LOCK FLUSH 100 UNIT/ML IV SOLN
500.0000 [IU] | Freq: Once | INTRAVENOUS | Status: AC | PRN
  Administered 2016-03-21: 500 [IU]
  Filled 2016-03-21: qty 5

## 2016-03-21 MED ORDER — SODIUM CHLORIDE 0.9 % IV SOLN
Freq: Once | INTRAVENOUS | Status: AC
  Administered 2016-03-21: 10:00:00 via INTRAVENOUS

## 2016-03-21 NOTE — Patient Instructions (Signed)
Rituximab injection What is this medicine? RITUXIMAB (ri TUX i mab) is a monoclonal antibody. It is used to treat non-Hodgkin lymphoma and chronic lymphocytic leukemia. It is also used to treat rheumatoid arthritis (RA). In RA, this medicine slows the inflammatory process and help reduce joint pain and swelling. This medicine is often used with other cancer or arthritis medications. This medicine may be used for other purposes; ask your health care provider or pharmacist if you have questions. COMMON BRAND NAME(S): Rituxan What should I tell my health care provider before I take this medicine? They need to know if you have any of these conditions: -heart disease -infection (especially a virus infection such as hepatitis B, chickenpox, cold sores, or herpes) -immune system problems -irregular heartbeat -kidney disease -lung or breathing disease, like asthma -recently received or scheduled to receive a vaccine -an unusual or allergic reaction to rituximab, mouse proteins, other medicines, foods, dyes, or preservatives -pregnant or trying to get pregnant -breast-feeding How should I use this medicine? This medicine is for infusion into a vein. It is administered in a hospital or clinic by a specially trained health care professional. A special MedGuide will be given to you by the pharmacist with each prescription and refill. Be sure to read this information carefully each time. Talk to your pediatrician regarding the use of this medicine in children. This medicine is not approved for use in children. Overdosage: If you think you have taken too much of this medicine contact a poison control center or emergency room at once. NOTE: This medicine is only for you. Do not share this medicine with others. What if I miss a dose? It is important not to miss a dose. Call your doctor or health care professional if you are unable to keep an appointment. What may interact with this  medicine? -cisplatin -medicines for blood pressure -some other medicines for arthritis -vaccines This list may not describe all possible interactions. Give your health care provider a list of all the medicines, herbs, non-prescription drugs, or dietary supplements you use. Also tell them if you smoke, drink alcohol, or use illegal drugs. Some items may interact with your medicine. What should I watch for while using this medicine? Report any side effects that you notice during your treatment right away, such as changes in your breathing, fever, chills, dizziness or lightheadedness. These effects are more common with the first dose. Visit your prescriber or health care professional for checks on your progress. You will need to have regular blood work. Report any other side effects. The side effects of this medicine can continue after you finish your treatment. Continue your course of treatment even though you feel ill unless your doctor tells you to stop. Call your doctor or health care professional for advice if you get a fever, chills or sore throat, or other symptoms of a cold or flu. Do not treat yourself. This drug decreases your body's ability to fight infections. Try to avoid being around people who are sick. This medicine may increase your risk to bruise or bleed. Call your doctor or health care professional if you notice any unusual bleeding. Be careful brushing and flossing your teeth or using a toothpick because you may get an infection or bleed more easily. If you have any dental work done, tell your dentist you are receiving this medicine. Avoid taking products that contain aspirin, acetaminophen, ibuprofen, naproxen, or ketoprofen unless instructed by your doctor. These medicines may hide a fever. Do not become pregnant   while taking this medicine. Women should inform their doctor if they wish to become pregnant or think they might be pregnant. There is a potential for serious side effects  to an unborn child. Talk to your health care professional or pharmacist for more information. Do not breast-feed an infant while taking this medicine. What side effects may I notice from receiving this medicine? Side effects that you should report to your doctor or health care professional as soon as possible: -allergic reactions like skin rash, itching or hives, swelling of the face, lips, or tongue -low blood counts - this medicine may decrease the number of white blood cells, red blood cells and platelets. You may be at increased risk for infections and bleeding. -signs of infection - fever or chills, cough, sore throat, pain or difficulty passing urine -signs of decreased platelets or bleeding - bruising, pinpoint red spots on the skin, black, tarry stools, blood in the urine -signs of decreased red blood cells - unusually weak or tired, fainting spells, lightheadedness -breathing problems -confused, not responsive -chest pain -fast, irregular heartbeat -feeling faint or lightheaded, falls -mouth sores -redness, blistering, peeling or loosening of the skin, including inside the mouth -stomach pain -swelling of the ankles, feet, or hands -trouble passing urine or change in the amount of urine Side effects that usually do not require medical attention (report to your doctor or health care professional if they continue or are bothersome): -anxiety -headache -loss of appetite -muscle aches -nausea -night sweats This list may not describe all possible side effects. Call your doctor for medical advice about side effects. You may report side effects to FDA at 1-800-FDA-1088. Where should I keep my medicine? This drug is given in a hospital or clinic and will not be stored at home. NOTE: This sheet is a summary. It may not cover all possible information. If you have questions about this medicine, talk to your doctor, pharmacist, or health care provider.  2017 Elsevier/Gold Standard  (2015-08-30 17:23:26)  

## 2016-03-24 ENCOUNTER — Telehealth: Payer: Self-pay | Admitting: Hematology

## 2016-03-24 NOTE — Telephone Encounter (Signed)
Added weekly taxol treatments starting 2/7 and f/u with [facility] 2/14 due to YF out on PAL - message to navigator.   Spoke with patient and she will get new schedule at 1/24 visit.

## 2016-03-25 NOTE — Progress Notes (Signed)
[place]  [phone] [phone]  Clinic Follow up Note   Patient Care Team: Maisie Fus, MD as PCP - General (Obstetrics and Gynecology) Collene Gobble, MD as Attending Physician (Psychiatry) Rolm Bookbinder, MD as Consulting Physician (General Surgery) Truitt Merle, MD as Consulting Physician (Hematology) Kyung Rudd, MD as Consulting Physician (Radiation Oncology) 03/26/2016   CHIEF COMPLAINTS:  Follow up left breast triple negative cancer  Oncology History   Malignant neoplasm of upper-outer quadrant of left female breast [facility])   Staging form: Breast, AJCC 7th Edition   - Clinical stage from 01/29/2016: Stage IIB (T2, N1, M0) - Signed by Truitt Merle, MD on 02/06/2016      Malignant neoplasm of upper-outer quadrant of left female breast ([place])   01/28/2016 Mammogram    Diagnostic MM and US showed a 2.9cm (3.2X2.2X2.5cm on [place]) mass in [place], multiple enlarged left axillary nodes, largest 2.5cm.       01/29/2016 Initial Diagnosis    Malignant neoplasm of upper-outer quadrant of left female breast ([place])      01/29/2016 Initial Biopsy    Left breast mass and axillary node biopsy showed IDC, G3,       01/29/2016 Receptors her2    ER-, PR-, HER2-, Ki67 85%      02/11/2016 Imaging    Bilateral breast MRI with and without contrast showed a 3.3 x 2.6 x 2.6 cm mass in the upper outer left breast, and left axillary lymphadenopathy, at least 3 abnormal lymph nodes, no other additional sites of concern.      02/12/2016 Imaging    CT chest, abdomen and pelvis with contrast showed a 2.4 x 2.7 cm lesion in the upper outer left breast, left axillary node metastasis measuring up to 1.3 cm, no evidence of distant metastasis.      02/12/2016 Imaging    Bone scan was negative for skeletal metastasis.      02/13/2016 -  Chemotherapy    Dose dense Adriamycin 60 mg/m, Cytoxan 600 mg/m, every 2 weeks, for 4 cycles, followed by weekly carboplatin and  Taxol for 12 weeks       HISTORY OF PRESENTING ILLNESS:  Grace Moyer 56 y.o. female is here because of her recently diagnosed left breast cancer. She was accompanied By her husband to our multidisciplinary breast clinic today.  This was discovered by screening mammogram, she had no palpable breast mass or axilla note. She denies any other new symptoms. She underwent a diagnostic mammogram and ultrasound on 01/28/2016, which showed a 2.9 cm mass in the upper outer quadrant, and multiple enlarged left axillary nodes, largest 2.5 cm. She underwent core needle biopsy of the left breast mass and left axillary node, both showed invasive ductal carcinoma, grade 3, triple negative, Ki-67 85%.  She developed bilateral numbness and tingling of her legs and hands at the end of May 2017. She underwent a multiple scans, and lab test, and was finally seen by neurologist Dr. Moshe Cipro, and was diagnosed with neuromyelitis optica spectrum disorder (NOSD). She initially received high-dose steroids, and then started Rituxan infusion, status post 2 infusions, now is on every four-month schedule, next due in Jan 2018. She states her neuropathy has been stable, she denies any significant vision problem, or other neurological symptoms. Her hand function and gait are normal. She is an Clinical biochemist.   GYN HISTORY  Menarchal: 13 LMP: 20 (hysterectomy) Contraceptive: 3 years  HRT: n/a  G1P1: 25 yo daughter   CURRENT THERAPY:  Dose dense Adriamycin 60 mg/m, Cytoxan 600 mg/m, every 2 weeks, for 4 cycles, followed by weekly carboplatin and Taxol for 12 weeks started 02/13/2016  INTERIM HISTORY [name] returns for follow-up and her fourth cycle of chemotherapy. She is doing well overall. She reports a couple days of diarrhea after treatment. She also reports taste change with treatment. She is still able to eat. The neuropathy got a little worse a few days after chemotherapy but has improved. She is still going to work  every day. She did fine with Rituxan. She thought she had a bladder infection last week because her urine was darker in color and smelled, but this resolved on its own. She reports occasional soreness in her left axilla. She became tearful while discussing the chances for cancer recurrence.   MEDICAL HISTORY:  Past Medical History:  Diagnosis Date  . Anxiety   . Family history of breast cancer   . Family history of colon cancer   . Malignant neoplasm of upper-outer quadrant of left female breast ([place]) 01/31/2016  . Neuromyelitis optica ([place])     SURGICAL HISTORY: Past Surgical History:  Procedure Laterality Date  . ABDOMINAL HYSTERECTOMY     partial  . EYE SURGERY    . PORTACATH PLACEMENT Right 02/11/2016   Procedure: INSERTION PORT-A-CATH WITH [place];  Surgeon: Rolm Bookbinder, MD;  Location: [facility];  Service: General;  Laterality: Right;    SOCIAL HISTORY: Social History   Social History  . Marital status: Married    Spouse name: N/A  . Number of children: N/A  . Years of education: N/A   Occupational History  . Not on file.   Social History Main Topics  . Smoking status: Former Smoker    Types: Cigarettes    Quit date: 09/03/2001  . Smokeless tobacco: Never Used  . Alcohol use No  . Drug use: No  . Sexual activity: Not on file   Other Topics Concern  . Not on file   Social History Narrative  . No narrative on file    FAMILY HISTORY: Family History  Problem Relation Age of Onset  . Breast cancer Maternal Grandmother     dx in her 1s  . CAD Paternal Grandmother   . Colon cancer Paternal Uncle     dx in his 16s  . COPD Maternal Grandfather   . CAD Paternal Grandfather   . Breast cancer Cousin     dx in her early to mid 18s; maternal first cousin  . Lung cancer Paternal Uncle     ALLERGIES:  has No Known Allergies.  MEDICATIONS:  Current Outpatient Prescriptions  Medication Sig Dispense Refill  . Cholecalciferol (VITAMIN D-1000 MAX  ST) 1000 units tablet Take 2 tablets by mouth daily.    Marland Kitchen HYDROcodone-acetaminophen (NORCO) 10-325 MG tablet Take 1 tablet by mouth every 6 (six) hours as needed. 10 tablet 0  . lidocaine-prilocaine (EMLA) cream Apply to affected area once 30 g 3  . omeprazole (PRILOSEC) 40 MG capsule Take 40 mg by mouth daily.    . ondansetron (ZOFRAN) 8 MG tablet Take 1 tablet (8 mg total) by mouth 2 (two) times daily as needed. Start on the third day after chemotherapy. 30 tablet 1  . prochlorperazine (COMPAZINE) 10 MG tablet Take 1 tablet (10 mg total) by mouth every 6 (six) hours as needed (Nausea or vomiting). 30 tablet 1  . riTUXimab (RITUXAN) 100 MG/10ML injection Inject into the vein every 4 (four) months.  No current facility-administered medications for this visit.     REVIEW OF SYSTEMS:   Constitutional: Denies fevers, chills or abnormal night sweats (+) taste change with treatment Eyes: Denies blurriness of vision, double vision or watery eyes Ears, nose, mouth, throat, and face: Denies mucositis or sore throat Respiratory: Denies cough, dyspnea or wheezes Cardiovascular: Denies palpitation, chest discomfort or lower extremity swelling Gastrointestinal:  Denies nausea, heartburn (+) diarrhea with treatment Skin: Denies abnormal skin rashes Lymphatics: Denies new lymphadenopathy or easy bruising Neurological:Denies numbness or new weaknesses (+) tingling, neuropathy stable Behavioral/Psych: Mood is stable, no new changes  Musculoskeletal: (+) occasional soreness in left axilla All other systems were reviewed with the patient and are negative.  PHYSICAL EXAMINATION: ECOG PERFORMANCE STATUS: 1 - Symptomatic but completely ambulatory  Vitals:   03/26/16 1156  BP: 130/71  Pulse: 66  Resp: 17  Temp: 98.6 F (37 C)   Filed Weights   03/26/16 1156  Weight: 198 lb 3.2 oz (89.9 kg)    GENERAL:alert, no distress and comfortable SKIN: skin color, texture, turgor are normal, no rashes or  significant lesions EYES: normal, conjunctiva are pink and non-injected, sclera clear OROPHARYNX:no exudate, no erythema and lips, buccal mucosa, and tongue normal  NECK: supple, thyroid normal size, non-tender, without nodularity. (+) Incision in the right lower neck for port. Port appears clean, neck not significantly swollen, no erythema, no sign of infection. LYMPH:  no palpable lymphadenopathy in the cervical, axillary or inguinal LUNGS: clear to auscultation and percussion with normal breathing effort HEART: regular rate & rhythm and no murmurs and no lower extremity edema ABDOMEN:abdomen soft, non-tender and normal bowel sounds MUSCULOSKELETALl:no cyanosis of digits and no clubbing  PSYCH: alert & oriented x 3 with fluent speech NEURO: no focal motor/sensory deficits Breasts: Breast inspection showed them to be symmetrical with no nipple discharge. Palpation of the breasts and axilla revealed no obvious mass that I could appreciate, there is fullness at the upper-out quadrant of left breast.    LABORATORY DATA:  I have reviewed the data as listed CBC Latest Ref Rng & Units 03/26/2016 03/12/2016 02/27/2016  WBC 3.9 - 10.3 10e3/uL 8.4 6.9 9.2  Hemoglobin 11.6 - 15.9 g/dL 11.4(L) 11.9 12.7  Hematocrit 34.8 - 46.6 % 32.6(L) 34.5(L) 37.4  Platelets 145 - 400 10e3/uL 131(L) 216 177   CMP Latest Ref Rng & Units 03/26/2016 03/12/2016 02/27/2016  Glucose 70 - 140 mg/dl 86 103 122  BUN 7.0 - 26.0 mg/dL 8.6 9.3 8.0  Creatinine 0.6 - 1.1 mg/dL 0.7 0.7 0.8  Sodium 136 - 145 mEq/L 141 141 141  Potassium 3.5 - 5.1 mEq/L 4.1 4.0 3.8  Chloride 101 - 111 mmol/L - - -  CO2 22 - 29 mEq/L _0 Calcium 8.4 - 10.4 mg/dL 8.9 9.1 8.9  Total Protein 6.4 - 8.3 g/dL 6.2(L) 6.3(L) 6.5  Total Bilirubin 0.20 - 1.20 mg/dL 0.46 0.34 0.32  Alkaline Phos 40 - 150 U/L 95 176(H) 175(H)  AST 5 - 34 U/L _1 ALT 0 - 55 U/L 24 36 69(H)   PATHOLOGY REPORT  Diagnosis 01/29/2016 1. Breast, left, needle core  biopsy, 1:30 o'clock - INVASIVE DUCTAL CARCINOMA, GRADE 3. - LYMPHOVASCULAR INVOLVEMENT BY TUMOR. 2. Lymph node, needle/core biopsy, left axillary - METASTATIC CARCINOMA. Microscopic Comment 1. , 2. A breast prognostic profile will be performed  1. Results: IMMUNOHISTOCHEMICAL AND MORPHOMETRIC ANALYSIS PERFORMED MANUALLY Estrogen Receptor: 0%, NEGATIVE Progesterone Receptor: 0%, NEGATIVE Proliferation Marker Ki67: 85%  Results: HER2 - NEGATIVE RATIO OF HER2/CEP17 SIGNALS 1.26 AVERAGE HER2 COPY NUMBER PER CELL 1.95  2. FLUORESCENCE IN-SITU HYBRIDIZATION Results: HER2 - NEGATIVE RATIO OF HER2/CEP17 SIGNALS 1.25 AVERAGE HER2 COPY NUMBER PER CELL 2.00  Results: IMMUNOHISTOCHEMICAL AND MORPHOMETRIC ANALYSIS PERFORMED MANUALLY Estrogen Receptor: 0%, NEGATIVE Progesterone Receptor: <1%, NEGATIVE, WEAK STAINING INTENSITY Proliferation Marker Ki67: 90%  RADIOGRAPHIC STUDIES: I have personally reviewed the radiological images as listed and agreed with the findings in the report. No results found.  Bone scan 02/12/2016 IMPRESSION: No evidence of skeletal metastatic disease.  CT chest abdomen pelvis w contrast 02/12/2016 IMPRESSION: 2.4 x 2.7 cm lesion in the upper outer left breast, corresponding to the patient's known breast neoplasm. Left axillary nodal metastasis measuring up to 13 mm short axis. No evidence of distant metastasis.  ASSESSMENT & PLAN: 56 y.o. Caucasian woman with past medical history of depression, presented with a screening mammogram discovered left breast cancer   1. Malignant neoplasm of upper-outer quadrant of left breast, invasive ductal carcinoma, G3, cT2N1M0, stage IIB, ER-/PR-/HER2- -I previously reviewed her imaging findings and the biopsy results in great detail with patient and her husband. -I previously discussed her breast MRI, CT scan and bone scan findings, which all confirmed the left breast lesion and axillary adenopathy, no other distant  metastasis. -she was seen by Dr. Donne Hazel and surgical options were discussed  -We previously discussed that triple negative breast cancers are much more aggressive, and her risk of cancer recurrence after breast surgery is pretty high, giving the multiple positive lymph nodes. I recommend her to consider neoadjuvant chemotherapy, to reduce her risk of recurrence, and downstage her breast cancer, to make lumpectomy and sentinel lymph node biopsy as a more feasible surgery. -I recommend her to have dose dense Adriamycin, Cytoxan every 2 weeks, with Neulasta support, for 4 cycles, followed by weekly carboplatin and Taxol for 12 weeks  -the goal of therapy is curative  -Due to her underlying NOSD, and existing peripheral neuropathy, my main concern of long-term side effects from chemotherapy is worsening neuropathy, especially from Taxol, we will monitor closely, and may need to hold it if her neuropathy gets worse.  -Her baseline echocardiogram was normal. -Lab reviewed, adequate for treatment, we'll proceed with 4th (last) cycle AC chemotherapy today -She will start weekly carbo and Taxol in 2 weeks, I discussed the potential side effects from [place] and Taxol, especially neuropathy, she agrees to proceed.  2. Genetics -Given her young age and triple negative disease, we recommend her to have genetic testing to ruled out inheritable breast cancer syndrome, and the test result may impact her surgery. -Genetic testing pending.  3. neuromyelitis optica spectrum disorder (NOSD) -she will continue follow up with her neurologist Dr. Moshe Cipro at [facility]  -she is on rituximab every 4 weeks, next due in March 21, 2016. It will be OK to continue her Rituximab when she is on chemotherapy, but the combination therapy will further compromise her immune system. -I have spoken with Dr. Moshe Cipro about her breast cancer treatment, he is agreeable. -She received Rituxan in our cancer center on 03/21/2016, tolerated  generally well   Plan -lab reviewed and proceed with 4th (last) cycle AC today  -She does not need any refills at this time. -lab, f/u, and first cycle weekly carboplatin and Taxol on 04/09/2016.    No orders of the defined types were placed in this encounter.   All questions were answered. The patient knows to call the clinic with any problems,  questions or concerns.  I spent 20 minutes counseling the patient face to face. The total time spent in the appointment was 25 minutes and more than 50% was on counseling.  This document serves as a record of services personally performed by Truitt Merle, MD. It was created on her behalf by Arlyce Harman, a trained medical scribe. The creation of this record is based on the scribe's personal observations and the provider's statements to them. This document has been checked and approved by the attending provider.     Truitt Merle, MD 03/26/2016

## 2016-03-26 ENCOUNTER — Encounter: Payer: Self-pay | Admitting: Hematology

## 2016-03-26 ENCOUNTER — Ambulatory Visit (HOSPITAL_BASED_OUTPATIENT_CLINIC_OR_DEPARTMENT_OTHER): Payer: 59

## 2016-03-26 ENCOUNTER — Other Ambulatory Visit (HOSPITAL_BASED_OUTPATIENT_CLINIC_OR_DEPARTMENT_OTHER): Payer: 59

## 2016-03-26 ENCOUNTER — Ambulatory Visit: Payer: 59

## 2016-03-26 ENCOUNTER — Encounter: Payer: Self-pay | Admitting: *Deleted

## 2016-03-26 ENCOUNTER — Ambulatory Visit (HOSPITAL_BASED_OUTPATIENT_CLINIC_OR_DEPARTMENT_OTHER): Payer: 59 | Admitting: Hematology

## 2016-03-26 VITALS — BP 130/71 | HR 66 | Temp 98.6°F | Resp 17 | Ht 63.0 in | Wt 198.2 lb

## 2016-03-26 DIAGNOSIS — C50412 Malignant neoplasm of upper-outer quadrant of left female breast: Secondary | ICD-10-CM

## 2016-03-26 DIAGNOSIS — Z5111 Encounter for antineoplastic chemotherapy: Secondary | ICD-10-CM | POA: Diagnosis not present

## 2016-03-26 DIAGNOSIS — Z171 Estrogen receptor negative status [ER-]: Secondary | ICD-10-CM

## 2016-03-26 DIAGNOSIS — G360 Neuromyelitis optica [Devic]: Secondary | ICD-10-CM

## 2016-03-26 DIAGNOSIS — Z95828 Presence of other vascular implants and grafts: Secondary | ICD-10-CM

## 2016-03-26 LAB — CBC WITH DIFFERENTIAL/PLATELET
BASO%: 0.5 % (ref 0.0–2.0)
Basophils Absolute: 0 10*3/uL (ref 0.0–0.1)
EOS%: 0.4 % (ref 0.0–7.0)
Eosinophils Absolute: 0 10*3/uL (ref 0.0–0.5)
HCT: 32.6 % — ABNORMAL LOW (ref 34.8–46.6)
HGB: 11.4 g/dL — ABNORMAL LOW (ref 11.6–15.9)
LYMPH%: 10.7 % — ABNORMAL LOW (ref 14.0–49.7)
MCH: 30 pg (ref 25.1–34.0)
MCHC: 35 g/dL (ref 31.5–36.0)
MCV: 85.8 fL (ref 79.5–101.0)
MONO#: 0.7 10*3/uL (ref 0.1–0.9)
MONO%: 8.5 % (ref 0.0–14.0)
NEUT#: 6.7 10*3/uL — ABNORMAL HIGH (ref 1.5–6.5)
NEUT%: 79.9 % — ABNORMAL HIGH (ref 38.4–76.8)
Platelets: 131 10*3/uL — ABNORMAL LOW (ref 145–400)
RBC: 3.8 10*6/uL (ref 3.70–5.45)
RDW: 15.4 % — ABNORMAL HIGH (ref 11.2–14.5)
WBC: 8.4 10*3/uL (ref 3.9–10.3)
lymph#: 0.9 10*3/uL (ref 0.9–3.3)

## 2016-03-26 LAB — COMPREHENSIVE METABOLIC PANEL
ALT: 24 U/L (ref 0–55)
AST: 16 U/L (ref 5–34)
Albumin: 3.9 g/dL (ref 3.5–5.0)
Alkaline Phosphatase: 95 U/L (ref 40–150)
Anion Gap: 9 mEq/L (ref 3–11)
BUN: 8.6 mg/dL (ref 7.0–26.0)
CO2: 23 mEq/L (ref 22–29)
Calcium: 8.9 mg/dL (ref 8.4–10.4)
Chloride: 109 mEq/L (ref 98–109)
Creatinine: 0.7 mg/dL (ref 0.6–1.1)
EGFR: >90 mL/min/{1.73_m2} (ref >90)
Glucose: 86 mg/dl (ref 70–140)
Potassium: 4.1 mEq/L (ref 3.5–5.1)
Sodium: 141 mEq/L (ref 136–145)
Total Bilirubin: 0.46 mg/dL (ref 0.20–1.20)
Total Protein: 6.2 g/dL — ABNORMAL LOW (ref 6.4–8.3)

## 2016-03-26 MED ORDER — FOSAPREPITANT DIMEGLUMINE INJECTION 150 MG
Freq: Once | INTRAVENOUS | Status: AC
  Administered 2016-03-26: 13:00:00 via INTRAVENOUS
  Filled 2016-03-26: qty 5

## 2016-03-26 MED ORDER — SODIUM CHLORIDE 0.9 % IV SOLN
Freq: Once | INTRAVENOUS | Status: AC
  Administered 2016-03-26: 13:00:00 via INTRAVENOUS

## 2016-03-26 MED ORDER — PALONOSETRON HCL INJECTION 0.25 MG/5ML
0.2500 mg | Freq: Once | INTRAVENOUS | Status: AC
  Administered 2016-03-26: 0.25 mg via INTRAVENOUS

## 2016-03-26 MED ORDER — PALONOSETRON HCL INJECTION 0.25 MG/5ML
INTRAVENOUS | Status: AC
  Filled 2016-03-26: qty 5

## 2016-03-26 MED ORDER — DOXORUBICIN HCL CHEMO IV INJECTION 2 MG/ML
60.0000 mg/m2 | Freq: Once | INTRAVENOUS | Status: AC
  Administered 2016-03-26: 122 mg via INTRAVENOUS
  Filled 2016-03-26: qty 61

## 2016-03-26 MED ORDER — PEGFILGRASTIM 6 MG/0.6ML ~~LOC~~ PSKT
6.0000 mg | PREFILLED_SYRINGE | Freq: Once | SUBCUTANEOUS | Status: AC
  Administered 2016-03-26: 6 mg via SUBCUTANEOUS
  Filled 2016-03-26: qty 0.6

## 2016-03-26 MED ORDER — SODIUM CHLORIDE 0.9% FLUSH
10.0000 mL | INTRAVENOUS | Status: DC | PRN
  Administered 2016-03-26: 10 mL
  Filled 2016-03-26: qty 10

## 2016-03-26 MED ORDER — HEPARIN SOD (PORK) LOCK FLUSH 100 UNIT/ML IV SOLN
500.0000 [IU] | Freq: Once | INTRAVENOUS | Status: AC | PRN
  Administered 2016-03-26: 500 [IU]
  Filled 2016-03-26: qty 5

## 2016-03-26 MED ORDER — SODIUM CHLORIDE 0.9 % IV SOLN
600.0000 mg/m2 | Freq: Once | INTRAVENOUS | Status: AC
  Administered 2016-03-26: 1220 mg via INTRAVENOUS
  Filled 2016-03-26: qty 61

## 2016-03-26 MED ORDER — SODIUM CHLORIDE 0.9% FLUSH
10.0000 mL | INTRAVENOUS | Status: DC | PRN
  Administered 2016-03-26: 10 mL via INTRAVENOUS
  Filled 2016-03-26: qty 10

## 2016-03-26 NOTE — Patient Instructions (Signed)
[place] Discharge Instructions for Patients Receiving Chemotherapy  Today you received the following chemotherapy agents:  Adriamycin (doxorubicin), Cytoxan  To help prevent nausea and vomiting after your treatment, we encourage you to take your nausea medication as prescribed.   If you develop nausea and vomiting that is not controlled by your nausea medication, call the clinic.   BELOW ARE SYMPTOMS THAT SHOULD BE REPORTED IMMEDIATELY:  *FEVER GREATER THAN 100.5 F  *CHILLS WITH OR WITHOUT FEVER  NAUSEA AND VOMITING THAT IS NOT CONTROLLED WITH YOUR NAUSEA MEDICATION  *UNUSUAL SHORTNESS OF BREATH  *UNUSUAL BRUISING OR BLEEDING  TENDERNESS IN MOUTH AND THROAT WITH OR WITHOUT PRESENCE OF ULCERS  *URINARY PROBLEMS  *BOWEL PROBLEMS  UNUSUAL RASH Items with * indicate a potential emergency and should be followed up as soon as possible.  Feel free to call the clinic you have any questions or concerns. The clinic phone number is [phone].  Please show the [place] at check-in to the Emergency Department and triage nurse.

## 2016-03-27 ENCOUNTER — Telehealth: Payer: Self-pay | Admitting: *Deleted

## 2016-03-27 NOTE — Telephone Encounter (Signed)
Patient called concerned because her On-PRO has not injected.  She had it put on at 2:30 yesterday and it is now 4:30.  Let her know that it takes 27 hrs.  So it should start injecting at about 5:30.  It then takes 45 minutes to inject.   She should wait an hour after it finishes.  She should have someone monitor it to tell if it is finished.  She did not get her instruction sheet this time.  This is her last treatment.  She appreciated the call back.

## 2016-04-03 ENCOUNTER — Encounter: Payer: Self-pay | Admitting: Genetic Counselor

## 2016-04-03 ENCOUNTER — Telehealth: Payer: Self-pay | Admitting: Genetic Counselor

## 2016-04-03 DIAGNOSIS — Z1379 Encounter for other screening for genetic and chromosomal anomalies: Secondary | ICD-10-CM | POA: Insufficient documentation

## 2016-04-03 NOTE — Telephone Encounter (Signed)
Revealed negative genetic testing.  Discussed that we do not know why she has breast cancer or why there is cancer in the family. It could be due to a different gene that we are not testing, or maybe our current technology may not be able to pick something up.  It will be important for her to keep in contact with genetics to keep up with whether additional testing may be needed. 

## 2016-04-07 ENCOUNTER — Telehealth: Payer: Self-pay | Admitting: Hematology

## 2016-04-07 ENCOUNTER — Ambulatory Visit: Payer: Self-pay | Admitting: Genetic Counselor

## 2016-04-07 DIAGNOSIS — Z800 Family history of malignant neoplasm of digestive organs: Secondary | ICD-10-CM

## 2016-04-07 DIAGNOSIS — Z171 Estrogen receptor negative status [ER-]: Secondary | ICD-10-CM

## 2016-04-07 DIAGNOSIS — C50412 Malignant neoplasm of upper-outer quadrant of left female breast: Secondary | ICD-10-CM

## 2016-04-07 DIAGNOSIS — Z1379 Encounter for other screening for genetic and chromosomal anomalies: Secondary | ICD-10-CM

## 2016-04-07 DIAGNOSIS — Z803 Family history of malignant neoplasm of breast: Secondary | ICD-10-CM

## 2016-04-07 NOTE — Progress Notes (Signed)
[place]  Telephone:([phone]) [phone] Fax:([phone]) [phone]  Clinic Follow up Note   Patient Care Team: Maisie Fus, MD as PCP - General (Obstetrics and Gynecology) Collene Gobble, MD as Attending Physician (Psychiatry) Rolm Bookbinder, MD as Consulting Physician (General Surgery) Truitt Merle, MD as Consulting Physician (Hematology) Kyung Rudd, MD as Consulting Physician (Radiation Oncology) 04/09/2016  CHIEF COMPLAINTS:  Follow up left breast triple negative cancer  Oncology History   Malignant neoplasm of upper-outer quadrant of left female breast [facility])   Staging form: Breast, AJCC 7th Edition   - Clinical stage from 01/29/2016: Stage IIB (T2, N1, M0) - Signed by Truitt Merle, MD on 02/06/2016      Malignant neoplasm of upper-outer quadrant of left female breast ([place])   01/28/2016 Mammogram    Diagnostic MM and US showed a 2.9cm (3.2X2.2X2.5cm on [place]) mass in [place], multiple enlarged left axillary nodes, largest 2.5cm.       01/29/2016 Initial Diagnosis    Malignant neoplasm of upper-outer quadrant of left female breast ([place])      01/29/2016 Initial Biopsy    Left breast mass and axillary node biopsy showed IDC, G3,       01/29/2016 Receptors her2    ER-, PR-, HER2-, Ki67 85%      02/11/2016 Imaging    Bilateral breast MRI with and without contrast showed a 3.3 x 2.6 x 2.6 cm mass in the upper outer left breast, and left axillary lymphadenopathy, at least 3 abnormal lymph nodes, no other additional sites of concern.      02/12/2016 Imaging    CT chest, abdomen and pelvis with contrast showed a 2.4 x 2.7 cm lesion in the upper outer left breast, left axillary node metastasis measuring up to 1.3 cm, no evidence of distant metastasis.      02/12/2016 Imaging    Bone scan was negative for skeletal metastasis.      02/13/2016 -  Chemotherapy    Dose dense Adriamycin 60 mg/m, Cytoxan 600 mg/m, every 2 weeks, for 4 cycles, followed by weekly carboplatin and Taxol  for 12 weeks       03/27/2016 Genetic Testing    Patient has genetic testing done for personal history of breast cancer, family history of cancer. ATM c.2606C>T VUS identified on the Breast/GYN panel.  Negative genetic testing for the MSH2 inversion analysis (Boland inversion). The Breast/GYN gene panel offered by [facility] includes sequencing and rearrangement analysis for the following 23 genes:  ATM, BARD1, BRCA1, BRCA2, BRIP1, CDH1, CHEK2, EPCAM, FANCC, MLH1, MSH2, MSH6, MUTYH, NBN, NF1, PALB2, PMS2, POLD1, PTEN, RAD51C, RAD51D, RECQL, and TP53.         HISTORY OF PRESENTING ILLNESS:  Grace Moyer 56 y.o. female is here because of her recently diagnosed left breast cancer. She was accompanied By her husband to our multidisciplinary breast clinic today.  This was discovered by screening mammogram, she had no palpable breast mass or axilla note. She denies any other new symptoms. She underwent a diagnostic mammogram and ultrasound on 01/28/2016, which showed a 2.9 cm mass in the upper outer quadrant, and multiple enlarged left axillary nodes, largest 2.5 cm. She underwent core needle biopsy of the left breast mass and left axillary node, both showed invasive ductal carcinoma, grade 3, triple negative, Ki-67 85%.  She developed bilateral numbness and tingling of her legs and hands at the end of May 2017. She underwent a multiple scans, and lab test, and was finally seen by neurologist  Dr. Moshe Cipro, and was diagnosed with neuromyelitis optica spectrum disorder (NOSD). She initially received high-dose steroids, and then started Rituxan infusion, status post 2 infusions, now is on every four-month schedule, next due in Jan 2018. She states her neuropathy has been stable, she denies any significant vision problem, or other neurological symptoms. Her hand function and gait are normal. She is an Clinical biochemist.   GYN HISTORY  Menarchal: 13 LMP: 81 (hysterectomy) Contraceptive: 3 years  HRT: n/a  G1P1: 80  yo daughter   CURRENT THERAPY: Dose dense Adriamycin 60 mg/m, Cytoxan 600 mg/m, every 2 weeks, for 4 cycles, followed by weekly carboplatin and Taxol for 12 weeks started 02/13/2016  INTERIM HISTORY Grace Moyer returns for follow-up and first week [place] and taxol. She had little big longer fatigue and low appetite after her last cycle AC, and has recovered well, no fever or worsening neuropathy. She continues working full time. No other new complains.   MEDICAL HISTORY:  Past Medical History:  Diagnosis Date  . Anxiety   . Family history of breast cancer   . Family history of colon cancer   . Malignant neoplasm of upper-outer quadrant of left female breast ([place]) 01/31/2016  . Neuromyelitis optica ([place])     SURGICAL HISTORY: Past Surgical History:  Procedure Laterality Date  . ABDOMINAL HYSTERECTOMY     partial  . EYE SURGERY    . PORTACATH PLACEMENT Right 02/11/2016   Procedure: INSERTION PORT-A-CATH WITH [place];  Surgeon: Rolm Bookbinder, MD;  Location: [place];  Service: General;  Laterality: Right;    SOCIAL HISTORY: Social History   Social History  . Marital status: Married    Spouse name: N/A  . Number of children: N/A  . Years of education: N/A   Occupational History  . Not on file.   Social History Main Topics  . Smoking status: Former Smoker    Types: Cigarettes    Quit date: 09/03/2001  . Smokeless tobacco: Never Used  . Alcohol use No  . Drug use: No  . Sexual activity: Not on file   Other Topics Concern  . Not on file   Social History Narrative  . No narrative on file    FAMILY HISTORY: Family History  Problem Relation Age of Onset  . Breast cancer Maternal Grandmother     dx in her 23s  . CAD Paternal Grandmother   . Colon cancer Paternal Uncle     dx in his 61s  . COPD Maternal Grandfather   . CAD Paternal Grandfather   . Breast cancer Cousin     dx in her early to mid 23s; maternal first cousin  . Lung cancer Paternal  Uncle     ALLERGIES:  has No Known Allergies.  MEDICATIONS:  Current Outpatient Prescriptions  Medication Sig Dispense Refill  . Cholecalciferol (VITAMIN D-1000 MAX ST) 1000 units tablet Take 2 tablets by mouth daily.    Marland Kitchen lidocaine-prilocaine (EMLA) cream Apply to affected area once 30 g 3  . omeprazole (PRILOSEC) 40 MG capsule Take 40 mg by mouth daily.    . ondansetron (ZOFRAN) 8 MG tablet Take 1 tablet (8 mg total) by mouth 2 (two) times daily as needed. Start on the third day after chemotherapy. 30 tablet 1  . prochlorperazine (COMPAZINE) 10 MG tablet Take 1 tablet (10 mg total) by mouth every 6 (six) hours as needed (Nausea or vomiting). 30 tablet 1  . riTUXimab (RITUXAN) 100 MG/10ML injection Inject into the vein every  4 (four) months.    Marland Kitchen HYDROcodone-acetaminophen (NORCO) 10-325 MG tablet Take 1 tablet by mouth every 6 (six) hours as needed. (Patient not taking: Reported on 04/09/2016) 10 tablet 0   No current facility-administered medications for this visit.     REVIEW OF SYSTEMS:   Constitutional: Denies fevers, chills or abnormal night sweats (+) taste change with treatment Eyes: Denies blurriness of vision, double vision or watery eyes Ears, nose, mouth, throat, and face: Denies mucositis or sore throat Respiratory: Denies cough, dyspnea or wheezes Cardiovascular: Denies palpitation, chest discomfort or lower extremity swelling Gastrointestinal:  Denies nausea, heartburn (+) diarrhea with treatment Skin: Denies abnormal skin rashes Lymphatics: Denies new lymphadenopathy or easy bruising Neurological:Denies numbness or new weaknesses (+) tingling, neuropathy stable Behavioral/Psych: Mood is stable, no new changes  Musculoskeletal: (+) occasional soreness in left axilla All other systems were reviewed with the patient and are negative.  PHYSICAL EXAMINATION: ECOG PERFORMANCE STATUS: 1 - Symptomatic but completely ambulatory  Vitals:   04/09/16 1305  BP: 132/70  Pulse:  79  Resp: 18  Temp: 99 F (37.2 C)   Filed Weights   04/09/16 1305  Weight: 195 lb 1.6 oz (88.5 kg)    GENERAL:alert, no distress and comfortable SKIN: skin color, texture, turgor are normal, no rashes or significant lesions EYES: normal, conjunctiva are pink and non-injected, sclera clear OROPHARYNX:no exudate, no erythema and lips, buccal mucosa, and tongue normal  NECK: supple, thyroid normal size, non-tender, without nodularity. (+) Incision in the right lower neck for port. Port appears clean, neck not significantly swollen, no erythema, no sign of infection. LYMPH:  no palpable lymphadenopathy in the cervical, axillary or inguinal LUNGS: clear to auscultation and percussion with normal breathing effort HEART: regular rate & rhythm and no murmurs and no lower extremity edema ABDOMEN:abdomen soft, non-tender and normal bowel sounds MUSCULOSKELETALl:no cyanosis of digits and no clubbing  PSYCH: alert & oriented x 3 with fluent speech NEURO: no focal motor/sensory deficits Breasts: Breast inspection showed them to be symmetrical with no nipple discharge. Palpation of the breasts and axilla revealed no obvious mass that I could appreciate, there is fullness at the upper-out quadrant of left breast.    LABORATORY DATA:  I have reviewed the data as listed CBC Latest Ref Rng & Units 04/09/2016 03/26/2016 03/12/2016  WBC 3.9 - 10.3 10e3/uL 7.6 8.4 6.9  Hemoglobin 11.6 - 15.9 g/dL 10.8(L) 11.4(L) 11.9  Hematocrit 34.8 - 46.6 % 32.4(L) 32.6(L) 34.5(L)  Platelets 145 - 400 10e3/uL 128(L) 131(L) 216   CMP Latest Ref Rng & Units 04/09/2016 03/26/2016 03/12/2016  Glucose 70 - 140 mg/dl 90 86 103  BUN 7.0 - 26.0 mg/dL 6.0(L) 8.6 9.3  Creatinine 0.6 - 1.1 mg/dL 0.6 0.7 0.7  Sodium 136 - 145 mEq/L 140 141 141  Potassium 3.5 - 5.1 mEq/L 3.9 4.1 4.0  Chloride 101 - 111 mmol/L - - -  CO2 22 - 29 mEq/L _0 Calcium 8.4 - 10.4 mg/dL 9.0 8.9 9.1  Total Protein 6.4 - 8.3 g/dL 5.9(L) 6.2(L) 6.3(L)   Total Bilirubin 0.20 - 1.20 mg/dL 0.49 0.46 0.34  Alkaline Phos 40 - 150 U/L 95 95 176(H)  AST 5 - 34 U/L _1 ALT 0 - 55 U/L 30 24 36   PATHOLOGY REPORT  Diagnosis 01/29/2016 1. Breast, left, needle core biopsy, 1:30 o'clock - INVASIVE DUCTAL CARCINOMA, GRADE 3. - LYMPHOVASCULAR INVOLVEMENT BY TUMOR. 2. Lymph node, needle/core biopsy, left axillary - METASTATIC  CARCINOMA. Microscopic Comment 1. , 2. A breast prognostic profile will be performed  1. Results: IMMUNOHISTOCHEMICAL AND MORPHOMETRIC ANALYSIS PERFORMED MANUALLY Estrogen Receptor: 0%, NEGATIVE Progesterone Receptor: 0%, NEGATIVE Proliferation Marker Ki67: 85%  Results: HER2 - NEGATIVE RATIO OF HER2/CEP17 SIGNALS 1.26 AVERAGE HER2 COPY NUMBER PER CELL 1.95  2. FLUORESCENCE IN-SITU HYBRIDIZATION Results: HER2 - NEGATIVE RATIO OF HER2/CEP17 SIGNALS 1.25 AVERAGE HER2 COPY NUMBER PER CELL 2.00  Results: IMMUNOHISTOCHEMICAL AND MORPHOMETRIC ANALYSIS PERFORMED MANUALLY Estrogen Receptor: 0%, NEGATIVE Progesterone Receptor: <1%, NEGATIVE, WEAK STAINING INTENSITY Proliferation Marker Ki67: 90%  RADIOGRAPHIC STUDIES: I have personally reviewed the radiological images as listed and agreed with the findings in the report. No results found.  Bone scan 02/12/2016 IMPRESSION: No evidence of skeletal metastatic disease.  CT chest abdomen pelvis w contrast 02/12/2016 IMPRESSION: 2.4 x 2.7 cm lesion in the upper outer left breast, corresponding to the patient's known breast neoplasm. Left axillary nodal metastasis measuring up to 13 mm short axis. No evidence of distant metastasis.  ASSESSMENT & PLAN: 56 y.o. Caucasian woman with past medical history of depression, presented with a screening mammogram discovered left breast cancer   1. Malignant neoplasm of upper-outer quadrant of left breast, invasive ductal carcinoma, G3, cT2N1M0, stage IIB, ER-/PR-/HER2- -I previously reviewed her imaging findings and the  biopsy results in great detail with patient and her husband. -I previously discussed her breast MRI, CT scan and bone scan findings, which all confirmed the left breast lesion and axillary adenopathy, no other distant metastasis. -she was seen by Dr. Donne Hazel and surgical options were discussed  -We previously discussed that triple negative breast cancers are much more aggressive, and her risk of cancer recurrence after breast surgery is pretty high, giving the multiple positive lymph nodes. I recommend her to consider neoadjuvant chemotherapy, to reduce her risk of recurrence, and downstage her breast cancer, to make lumpectomy and sentinel lymph node biopsy as a more feasible surgery. -I recommend her to have dose dense Adriamycin, Cytoxan every 2 weeks, with Neulasta support, for 4 cycles, followed by weekly carboplatin and Taxol for 12 weeks  -the goal of therapy is curative  -Due to her underlying NOSD, and existing peripheral neuropathy, my main concern of long-term side effects from chemotherapy is worsening neuropathy, especially from Taxol, we will monitor closely, and may need to hold it if her neuropathy gets worse.  -Her baseline echocardiogram was normal. -Lab reviewed, mild thrombocytopenia, adequate for treatment, we'll proceed first cycle carbo and taxol -I again reviewed the potential side effects from [place] and Taxol, especially neuropathy and cytopenia, possibility of blood transfusion, she agrees to proceed.  2. Genetics -Given her young age and triple negative disease, we recommend her to have genetic testing to ruled out inheritable breast cancer syndrome, and the test result may impact her surgery. -Genetic testing pending.  3. neuromyelitis optica spectrum disorder (NOSD) -she will continue follow up with her neurologist Dr. Moshe Cipro at [facility]  -she is on rituximab every 4 weeks, next due in March 21, 2016. It will be OK to continue her Rituximab when she is on chemotherapy,  but the combination therapy will further compromise her immune system. -I have spoken with Dr. Moshe Cipro about her breast cancer treatment, he is agreeable. -She received Rituxan in our cancer center on 03/21/2016, tolerated generally well   Plan -lab reviewed and proceed with first week [place] and taxol today, and continue weekly  -I will see her back in 2 weeks    No orders of  the defined types were placed in this encounter.   All questions were answered. The patient knows to call the clinic with any problems, questions or concerns.  I spent 20 minutes counseling the patient face to face. The total time spent in the appointment was 25 minutes and more than 50% was on counseling.  This document serves as a record of services personally performed by Truitt Merle, MD. It was created on her behalf by Arlyce Harman, a trained medical scribe. The creation of this record is based on the scribe's personal observations and the provider's statements to them. This document has been checked and approved by the attending provider.     Truitt Merle, MD 04/12/2016

## 2016-04-07 NOTE — Progress Notes (Addendum)
HPI: Ms. Basden was previously seen in the [place] clinic due to a personal and family history of cancer and concerns regarding a hereditary predisposition to cancer. Please refer to our prior cancer genetics clinic note for more information regarding Ms. Nand's medical, social and family histories, and our assessment and recommendations, at the time. Ms. Ackley recent genetic test results were disclosed to her, as were recommendations warranted by these results. These results and recommendations are discussed in more detail below.  CANCER HISTORY:  Oncology History   Malignant neoplasm of upper-outer quadrant of left female breast [facility])   Staging form: Breast, AJCC 7th Edition   - Clinical stage from 01/29/2016: Stage IIB (T2, N1, M0) - Signed by Truitt Merle, MD on 02/06/2016      Malignant neoplasm of upper-outer quadrant of left female breast ([place])   01/28/2016 Mammogram    Diagnostic MM and US showed a 2.9cm (3.2X2.2X2.5cm on [place]) mass in [place], multiple enlarged left axillary nodes, largest 2.5cm.       01/29/2016 Initial Diagnosis    Malignant neoplasm of upper-outer quadrant of left female breast ([place])      01/29/2016 Initial Biopsy    Left breast mass and axillary node biopsy showed IDC, G3,       01/29/2016 Receptors her2    ER-, PR-, HER2-, Ki67 85%      02/11/2016 Imaging    Bilateral breast MRI with and without contrast showed a 3.3 x 2.6 x 2.6 cm mass in the upper outer left breast, and left axillary lymphadenopathy, at least 3 abnormal lymph nodes, no other additional sites of concern.      02/12/2016 Imaging    CT chest, abdomen and pelvis with contrast showed a 2.4 x 2.7 cm lesion in the upper outer left breast, left axillary node metastasis measuring up to 1.3 cm, no evidence of distant metastasis.      02/12/2016 Imaging    Bone scan was negative for skeletal metastasis.      02/13/2016 -  Chemotherapy    Dose dense Adriamycin 60 mg/m,  Cytoxan 600 mg/m, every 2 weeks, for 4 cycles, followed by weekly carboplatin and Taxol for 12 weeks       03/27/2016 Genetic Testing    Patient has genetic testing done for personal history of breast cancer, family history of cancer. ATM c.2606C>T VUS identified on the Breast/GYN panel.  Negative genetic testing for the MSH2 inversion analysis ([place] inversion). The Breast/GYN gene panel offered by [facility] includes sequencing and rearrangement analysis for the following 23 genes:  ATM, BARD1, BRCA1, BRCA2, BRIP1, CDH1, CHEK2, EPCAM, FANCC, MLH1, MSH2, MSH6, MUTYH, NBN, NF1, PALB2, PMS2, POLD1, PTEN, RAD51C, RAD51D, RECQL, and TP53.          FAMILY HISTORY:  We obtained a detailed, 4-generation family history.  Significant diagnoses are listed below: Family History  Problem Relation Age of Onset  . Breast cancer Maternal Grandmother     dx in her 42s  . CAD Paternal Grandmother   . Colon cancer Paternal Uncle     dx in his 50s  . COPD Maternal Grandfather   . CAD Paternal Grandfather   . Breast cancer Cousin     dx in her early to mid 25s; maternal first cousin  . Lung cancer Paternal Uncle     The patient has one daughter and one sister who are both cancer free.  Her parents are both living and have never had cancer.  Her  mother has two brothers and one sister who are all cancer free.  One brother has a daughter who had breast cancer in her 82's.  The patient's maternal grandmother had breast cancer in her 65's, and her grandfather died of COPD.  The patient's father had 10 siblings.  One brother had colon cancer in his 58's and a brother had lung cancer but there is no other cancer history on this side of the family.  Ms. Suthers is unaware of previous family history of genetic testing for hereditary cancer risks. Patient's maternal ancestors are of Caucasian descent, and paternal ancestors are of [place] descent. There is no reported Ashkenazi Jewish ancestry. There is no known  consanguinity.  GENETIC TEST RESULTS: Genetic testing reported out on March 27, 2016 through the [facility] cancer panel found no deleterious mutations. Negative genetic testing for the MSH2 inversion analysis ([place]oland inversion).  The Breast/GYN gene panel offered by [facility] includes sequencing and rearrangement analysis for the following 23 genes:  ATM, BARD1, BRCA1, BRCA2, BRIP1, CDH1, CHEK2, EPCAM, FANCC, MLH1, MSH2, MSH6, MUTYH, NBN, NF1, PALB2, PMS2, POLD1, PTEN, RAD51C, RAD51D, RECQL, and TP53.   The test report has been scanned into EPIC and is located under the Molecular Pathology section of the Results Review tab.   We discussed with Ms. Boutin that since the current genetic testing is not perfect, it is possible there may be a gene mutation in one of these genes that current testing cannot detect, but that chance is small. We also discussed, that it is possible that another gene that has not yet been discovered, or that we have not yet tested, is responsible for the cancer diagnoses in the family, and it is, therefore, important to remain in touch with cancer genetics in the future so that we can continue to offer Ms. Gebhardt the most up to date genetic testing.   Genetic testing did detect a Variant of Unknown Significance in the ATM gene called c.2606C>T. At this time, it is unknown if this variant is associated with increased cancer risk or if this is a normal finding, but most variants such as this get reclassified to being inconsequential. It should not be used to make medical management decisions. With time, we suspect the lab will determine the significance of this variant, if any. If we do learn more about it, we will try to contact Ms. Cumpian to discuss it further. However, it is important to stay in touch with [place] periodically and keep the address and phone number up to date.   CANCER SCREENING RECOMMENDATIONS:  This result is reassuring and indicates that Ms. Ellery likely does not have  an increased risk for a future cancer due to a mutation in one of these genes. This normal test also suggests that Ms. Bowermaster's cancer was most likely not due to an inherited predisposition associated with one of these genes.  Most cancers happen by chance and this negative test suggests that her cancer falls into this category.  We, therefore, recommended she continue to follow the cancer management and screening guidelines provided by her oncology and primary healthcare provider.   RECOMMENDATIONS FOR FAMILY MEMBERS: Women in this family might be at some increased risk of developing cancer, over the general population risk, simply due to the family history of cancer. We recommended women in this family have a yearly mammogram beginning at age 48, or 58 years younger than the earliest onset of cancer, an annual clinical breast exam, and perform monthly breast self-exams.  Women in this family should also have a gynecological exam as recommended by their primary provider. All family members should have a colonoscopy by age 38.  FOLLOW-UP: Lastly, we discussed with Ms. Covey that cancer genetics is a rapidly advancing field and it is possible that new genetic tests will be appropriate for her and/or her family members in the future. We encouraged her to remain in contact with cancer genetics on an annual basis so we can update her personal and family histories and let her know of advances in cancer genetics that may benefit this family.   Our contact number was provided. Ms. Worthy questions were answered to her satisfaction, and she knows she is welcome to call us at anytime with additional questions or concerns.   Roma Kayser, MS, [facility] Certified Genetic Counselor [email or web address]

## 2016-04-07 NOTE — Telephone Encounter (Signed)
Called and confirmed appointments with patient on 2/7

## 2016-04-09 ENCOUNTER — Ambulatory Visit (HOSPITAL_BASED_OUTPATIENT_CLINIC_OR_DEPARTMENT_OTHER): Payer: 59 | Admitting: Hematology

## 2016-04-09 ENCOUNTER — Other Ambulatory Visit (HOSPITAL_BASED_OUTPATIENT_CLINIC_OR_DEPARTMENT_OTHER): Payer: 59

## 2016-04-09 ENCOUNTER — Ambulatory Visit: Payer: 59

## 2016-04-09 ENCOUNTER — Ambulatory Visit (HOSPITAL_BASED_OUTPATIENT_CLINIC_OR_DEPARTMENT_OTHER): Payer: 59

## 2016-04-09 VITALS — BP 112/65 | HR 71 | Temp 98.8°F | Resp 16

## 2016-04-09 VITALS — BP 132/70 | HR 79 | Temp 99.0°F | Resp 18 | Ht 63.0 in | Wt 195.1 lb

## 2016-04-09 DIAGNOSIS — G360 Neuromyelitis optica [Devic]: Secondary | ICD-10-CM

## 2016-04-09 DIAGNOSIS — Z5111 Encounter for antineoplastic chemotherapy: Secondary | ICD-10-CM

## 2016-04-09 DIAGNOSIS — C773 Secondary and unspecified malignant neoplasm of axilla and upper limb lymph nodes: Secondary | ICD-10-CM

## 2016-04-09 DIAGNOSIS — Z95828 Presence of other vascular implants and grafts: Secondary | ICD-10-CM

## 2016-04-09 DIAGNOSIS — Z171 Estrogen receptor negative status [ER-]: Secondary | ICD-10-CM

## 2016-04-09 DIAGNOSIS — C50412 Malignant neoplasm of upper-outer quadrant of left female breast: Secondary | ICD-10-CM | POA: Diagnosis not present

## 2016-04-09 LAB — CBC WITH DIFFERENTIAL/PLATELET
BASO%: 0.5 % (ref 0.0–2.0)
Basophils Absolute: 0 10*3/uL (ref 0.0–0.1)
EOS%: 0.4 % (ref 0.0–7.0)
Eosinophils Absolute: 0 10*3/uL (ref 0.0–0.5)
HCT: 32.4 % — ABNORMAL LOW (ref 34.8–46.6)
HGB: 10.8 g/dL — ABNORMAL LOW (ref 11.6–15.9)
LYMPH%: 8.7 % — ABNORMAL LOW (ref 14.0–49.7)
MCH: 30.3 pg (ref 25.1–34.0)
MCHC: 33.3 g/dL (ref 31.5–36.0)
MCV: 90.8 fL (ref 79.5–101.0)
MONO#: 0.6 10*3/uL (ref 0.1–0.9)
MONO%: 8.1 % (ref 0.0–14.0)
NEUT#: 6.3 10*3/uL (ref 1.5–6.5)
NEUT%: 82.3 % — ABNORMAL HIGH (ref 38.4–76.8)
Platelets: 128 10*3/uL — ABNORMAL LOW (ref 145–400)
RBC: 3.57 10*6/uL — ABNORMAL LOW (ref 3.70–5.45)
RDW: 16.8 % — ABNORMAL HIGH (ref 11.2–14.5)
WBC: 7.6 10*3/uL (ref 3.9–10.3)
lymph#: 0.7 10*3/uL — ABNORMAL LOW (ref 0.9–3.3)

## 2016-04-09 LAB — COMPREHENSIVE METABOLIC PANEL
ALT: 30 U/L (ref 0–55)
AST: 19 U/L (ref 5–34)
Albumin: 3.8 g/dL (ref 3.5–5.0)
Alkaline Phosphatase: 95 U/L (ref 40–150)
Anion Gap: 7 mEq/L (ref 3–11)
BUN: 6 mg/dL — ABNORMAL LOW (ref 7.0–26.0)
CO2: 24 mEq/L (ref 22–29)
Calcium: 9 mg/dL (ref 8.4–10.4)
Chloride: 109 mEq/L (ref 98–109)
Creatinine: 0.6 mg/dL (ref 0.6–1.1)
EGFR: >90 mL/min/{1.73_m2} (ref >90)
Glucose: 90 mg/dl (ref 70–140)
Potassium: 3.9 mEq/L (ref 3.5–5.1)
Sodium: 140 mEq/L (ref 136–145)
Total Bilirubin: 0.49 mg/dL (ref 0.20–1.20)
Total Protein: 5.9 g/dL — ABNORMAL LOW (ref 6.4–8.3)

## 2016-04-09 MED ORDER — PACLITAXEL CHEMO INJECTION 300 MG/50ML
80.0000 mg/m2 | Freq: Once | INTRAVENOUS | Status: AC
  Administered 2016-04-09: 162 mg via INTRAVENOUS
  Filled 2016-04-09: qty 27

## 2016-04-09 MED ORDER — SODIUM CHLORIDE 0.9 % IV SOLN
Freq: Once | INTRAVENOUS | Status: AC
  Administered 2016-04-09: 14:00:00 via INTRAVENOUS

## 2016-04-09 MED ORDER — HEPARIN SOD (PORK) LOCK FLUSH 100 UNIT/ML IV SOLN
500.0000 [IU] | Freq: Once | INTRAVENOUS | Status: AC | PRN
  Administered 2016-04-09: 500 [IU]
  Filled 2016-04-09: qty 5

## 2016-04-09 MED ORDER — FAMOTIDINE IN NACL 20-0.9 MG/50ML-% IV SOLN
INTRAVENOUS | Status: AC
  Filled 2016-04-09: qty 50

## 2016-04-09 MED ORDER — SODIUM CHLORIDE 0.9% FLUSH
10.0000 mL | INTRAVENOUS | Status: DC | PRN
  Administered 2016-04-09: 10 mL via INTRAVENOUS
  Filled 2016-04-09: qty 10

## 2016-04-09 MED ORDER — DEXAMETHASONE SODIUM PHOSPHATE 10 MG/ML IJ SOLN
10.0000 mg | Freq: Once | INTRAMUSCULAR | Status: AC
  Administered 2016-04-09: 10 mg via INTRAVENOUS

## 2016-04-09 MED ORDER — DEXAMETHASONE SODIUM PHOSPHATE 10 MG/ML IJ SOLN
INTRAMUSCULAR | Status: AC
  Filled 2016-04-09: qty 1

## 2016-04-09 MED ORDER — FAMOTIDINE IN NACL 20-0.9 MG/50ML-% IV SOLN
20.0000 mg | Freq: Once | INTRAVENOUS | Status: AC
  Administered 2016-04-09: 20 mg via INTRAVENOUS

## 2016-04-09 MED ORDER — DIPHENHYDRAMINE HCL 50 MG/ML IJ SOLN
INTRAMUSCULAR | Status: AC
  Filled 2016-04-09: qty 1

## 2016-04-09 MED ORDER — DIPHENHYDRAMINE HCL 50 MG/ML IJ SOLN
25.0000 mg | Freq: Once | INTRAMUSCULAR | Status: AC
  Administered 2016-04-09: 25 mg via INTRAVENOUS

## 2016-04-09 MED ORDER — SODIUM CHLORIDE 0.9 % IV SOLN
286.4000 mg | Freq: Once | INTRAVENOUS | Status: AC
  Administered 2016-04-09: 290 mg via INTRAVENOUS
  Filled 2016-04-09: qty 29

## 2016-04-09 MED ORDER — SODIUM CHLORIDE 0.9% FLUSH
10.0000 mL | INTRAVENOUS | Status: DC | PRN
  Administered 2016-04-09: 10 mL
  Filled 2016-04-09: qty 10

## 2016-04-09 MED ORDER — SODIUM CHLORIDE 0.9 % IV SOLN: 10.0000 mg | Freq: Once | INTRAVENOUS | Status: DC

## 2016-04-09 NOTE — Patient Instructions (Addendum)
[facility] Discharge Instructions for Patients Receiving Chemotherapy  Today you received the following chemotherapy agents:  Taxol & Carboplatin  To help prevent nausea and vomiting after your treatment, we encourage you to take your nausea medication as prescribed.   If you develop nausea and vomiting that is not controlled by your nausea medication, call the clinic.   BELOW ARE SYMPTOMS THAT SHOULD BE REPORTED IMMEDIATELY:  *FEVER GREATER THAN 100.5 F  *CHILLS WITH OR WITHOUT FEVER  NAUSEA AND VOMITING THAT IS NOT CONTROLLED WITH YOUR NAUSEA MEDICATION  *UNUSUAL SHORTNESS OF BREATH  *UNUSUAL BRUISING OR BLEEDING  TENDERNESS IN MOUTH AND THROAT WITH OR WITHOUT PRESENCE OF ULCERS  *URINARY PROBLEMS  *BOWEL PROBLEMS  UNUSUAL RASH Items with * indicate a potential emergency and should be followed up as soon as possible.  Feel free to call the clinic you have any questions or concerns. The clinic phone number is [phone].  Please show the [facility] at check-in to the Emergency Department and triage nurse.  Paclitaxel injection What is this medicine? PACLITAXEL (PAK li TAX el) is a chemotherapy drug. It targets fast dividing cells, like cancer cells, and causes these cells to die. This medicine is used to treat ovarian cancer, breast cancer, and other cancers. This medicine may be used for other purposes; ask your health care provider or pharmacist if you have questions. COMMON BRAND NAME(S): Onxol, Taxol What should I tell my health care provider before I take this medicine? They need to know if you have any of these conditions: -blood disorders -irregular heartbeat -infection (especially a virus infection such as chickenpox, cold sores, or herpes) -liver disease -previous or ongoing radiation therapy -an unusual or allergic reaction to paclitaxel, alcohol, polyoxyethylated castor oil, other chemotherapy agents, other medicines, foods, dyes, or  preservatives -pregnant or trying to get pregnant -breast-feeding How should I use this medicine? This drug is given as an infusion into a vein. It is administered in a hospital or clinic by a specially trained health care professional. Talk to your pediatrician regarding the use of this medicine in children. Special care may be needed. Overdosage: If you think you have taken too much of this medicine contact a poison control center or emergency room at once. NOTE: This medicine is only for you. Do not share this medicine with others. What if I miss a dose? It is important not to miss your dose. Call your doctor or health care professional if you are unable to keep an appointment. What may interact with this medicine? Do not take this medicine with any of the following medications: -disulfiram -metronidazole This medicine may also interact with the following medications: -cyclosporine -diazepam -ketoconazole -medicines to increase blood counts like filgrastim, pegfilgrastim, sargramostim -other chemotherapy drugs like cisplatin, doxorubicin, epirubicin, etoposide, teniposide, vincristine -quinidine -testosterone -vaccines -verapamil Talk to your doctor or health care professional before taking any of these medicines: -acetaminophen -aspirin -ibuprofen -ketoprofen -naproxen This list may not describe all possible interactions. Give your health care provider a list of all the medicines, herbs, non-prescription drugs, or dietary supplements you use. Also tell them if you smoke, drink alcohol, or use illegal drugs. Some items may interact with your medicine. What should I watch for while using this medicine? Your condition will be monitored carefully while you are receiving this medicine. You will need important blood work done while you are taking this medicine. This medicine can cause serious allergic reactions. To reduce your risk you will need  to take other medicine(s) before  treatment with this medicine. If you experience allergic reactions like skin rash, itching or hives, swelling of the face, lips, or tongue, tell your doctor or health care professional right away. In some cases, you may be given additional medicines to help with side effects. Follow all directions for their use. This drug may make you feel generally unwell. This is not uncommon, as chemotherapy can affect healthy cells as well as cancer cells. Report any side effects. Continue your course of treatment even though you feel ill unless your doctor tells you to stop. Call your doctor or health care professional for advice if you get a fever, chills or sore throat, or other symptoms of a cold or flu. Do not treat yourself. This drug decreases your body's ability to fight infections. Try to avoid being around people who are sick. This medicine may increase your risk to bruise or bleed. Call your doctor or health care professional if you notice any unusual bleeding. Be careful brushing and flossing your teeth or using a toothpick because you may get an infection or bleed more easily. If you have any dental work done, tell your dentist you are receiving this medicine. Avoid taking products that contain aspirin, acetaminophen, ibuprofen, naproxen, or ketoprofen unless instructed by your doctor. These medicines may hide a fever. Do not become pregnant while taking this medicine. Women should inform their doctor if they wish to become pregnant or think they might be pregnant. There is a potential for serious side effects to an unborn child. Talk to your health care professional or pharmacist for more information. Do not breast-feed an infant while taking this medicine. Men are advised not to father a child while receiving this medicine. This product may contain alcohol. Ask your pharmacist or healthcare provider if this medicine contains alcohol. Be sure to tell all healthcare providers you are taking this medicine.  Certain medicines, like metronidazole and disulfiram, can cause an unpleasant reaction when taken with alcohol. The reaction includes flushing, headache, nausea, vomiting, sweating, and increased thirst. The reaction can last from 30 minutes to several hours. What side effects may I notice from receiving this medicine? Side effects that you should report to your doctor or health care professional as soon as possible: -allergic reactions like skin rash, itching or hives, swelling of the face, lips, or tongue -low blood counts - This drug may decrease the number of white blood cells, red blood cells and platelets. You may be at increased risk for infections and bleeding. -signs of infection - fever or chills, cough, sore throat, pain or difficulty passing urine -signs of decreased platelets or bleeding - bruising, pinpoint red spots on the skin, black, tarry stools, nosebleeds -signs of decreased red blood cells - unusually weak or tired, fainting spells, lightheadedness -breathing problems -chest pain -high or low blood pressure -mouth sores -nausea and vomiting -pain, swelling, redness or irritation at the injection site -pain, tingling, numbness in the hands or feet -slow or irregular heartbeat -swelling of the ankle, feet, hands Side effects that usually do not require medical attention (report to your doctor or health care professional if they continue or are bothersome): -bone pain -complete hair loss including hair on your head, underarms, pubic hair, eyebrows, and eyelashes -changes in the color of fingernails -diarrhea -loosening of the fingernails -loss of appetite -muscle or joint pain -red flush to skin -sweating This list may not describe all possible side effects. Call your doctor for medical advice  about side effects. You may report side effects to FDA at 1-800-FDA-1088. Where should I keep my medicine? This drug is given in a hospital or clinic and will not be stored at  home. NOTE: This sheet is a summary. It may not cover all possible information. If you have questions about this medicine, talk to your doctor, pharmacist, or health care provider.  2017 [employer]/Gold Standard (2014-12-19 19:58:00)  Carboplatin injection What is this medicine? CARBOPLATIN (KAR boe pla tin) is a chemotherapy drug. It targets fast dividing cells, like cancer cells, and causes these cells to die. This medicine is used to treat ovarian cancer and many other cancers. This medicine may be used for other purposes; ask your health care provider or pharmacist if you have questions. COMMON BRAND NAME(S): Paraplatin What should I tell my health care provider before I take this medicine? They need to know if you have any of these conditions: -blood disorders -hearing problems -kidney disease -recent or ongoing radiation therapy -an unusual or allergic reaction to carboplatin, cisplatin, other chemotherapy, other medicines, foods, dyes, or preservatives -pregnant or trying to get pregnant -breast-feeding How should I use this medicine? This drug is usually given as an infusion into a vein. It is administered in a hospital or clinic by a specially trained health care professional. Talk to your pediatrician regarding the use of this medicine in children. Special care may be needed. Overdosage: If you think you have taken too much of this medicine contact a poison control center or emergency room at once. NOTE: This medicine is only for you. Do not share this medicine with others. What if I miss a dose? It is important not to miss a dose. Call your doctor or health care professional if you are unable to keep an appointment. What may interact with this medicine? -medicines for seizures -medicines to increase blood counts like filgrastim, pegfilgrastim, sargramostim -some antibiotics like amikacin, gentamicin, neomycin, streptomycin, tobramycin -vaccines Talk to your doctor or health  care professional before taking any of these medicines: -acetaminophen -aspirin -ibuprofen -ketoprofen -naproxen This list may not describe all possible interactions. Give your health care provider a list of all the medicines, herbs, non-prescription drugs, or dietary supplements you use. Also tell them if you smoke, drink alcohol, or use illegal drugs. Some items may interact with your medicine. What should I watch for while using this medicine? Your condition will be monitored carefully while you are receiving this medicine. You will need important blood work done while you are taking this medicine. This drug may make you feel generally unwell. This is not uncommon, as chemotherapy can affect healthy cells as well as cancer cells. Report any side effects. Continue your course of treatment even though you feel ill unless your doctor tells you to stop. In some cases, you may be given additional medicines to help with side effects. Follow all directions for their use. Call your doctor or health care professional for advice if you get a fever, chills or sore throat, or other symptoms of a cold or flu. Do not treat yourself. This drug decreases your body's ability to fight infections. Try to avoid being around people who are sick. This medicine may increase your risk to bruise or bleed. Call your doctor or health care professional if you notice any unusual bleeding. Be careful brushing and flossing your teeth or using a toothpick because you may get an infection or bleed more easily. If you have any dental work done, tell your dentist  you are receiving this medicine. Avoid taking products that contain aspirin, acetaminophen, ibuprofen, naproxen, or ketoprofen unless instructed by your doctor. These medicines may hide a fever. Do not become pregnant while taking this medicine. Women should inform their doctor if they wish to become pregnant or think they might be pregnant. There is a potential for serious  side effects to an unborn child. Talk to your health care professional or pharmacist for more information. Do not breast-feed an infant while taking this medicine. What side effects may I notice from receiving this medicine? Side effects that you should report to your doctor or health care professional as soon as possible: -allergic reactions like skin rash, itching or hives, swelling of the face, lips, or tongue -signs of infection - fever or chills, cough, sore throat, pain or difficulty passing urine -signs of decreased platelets or bleeding - bruising, pinpoint red spots on the skin, black, tarry stools, nosebleeds -signs of decreased red blood cells - unusually weak or tired, fainting spells, lightheadedness -breathing problems -changes in hearing -changes in vision -chest pain -high blood pressure -low blood counts - This drug may decrease the number of white blood cells, red blood cells and platelets. You may be at increased risk for infections and bleeding. -nausea and vomiting -pain, swelling, redness or irritation at the injection site -pain, tingling, numbness in the hands or feet -problems with balance, talking, walking -trouble passing urine or change in the amount of urine Side effects that usually do not require medical attention (report to your doctor or health care professional if they continue or are bothersome): -hair loss -loss of appetite -metallic taste in the mouth or changes in taste This list may not describe all possible side effects. Call your doctor for medical advice about side effects. You may report side effects to FDA at 1-800-FDA-1088. Where should I keep my medicine? This drug is given in a hospital or clinic and will not be stored at home. NOTE: This sheet is a summary. It may not cover all possible information. If you have questions about this medicine, talk to your doctor, pharmacist, or health care provider.  2017 [employer]/Gold Standard (2007-05-25  14:38:05)

## 2016-04-10 ENCOUNTER — Telehealth: Payer: Self-pay | Admitting: *Deleted

## 2016-04-10 NOTE — Telephone Encounter (Signed)
Called pt to f/u on taxol/carbo treatment that she had yest.  She reports tol. very well with no N/V & states that this treatment is much better than last.  She had some diarrhea last hs & took imodium & hasn't had any since.  She went to bed early @ 8 p & slept till @ 1 pm but she states she slept well, "just went to bed too early".  Informed to call if any problems or concerns.  She has nausea & pain meds if needed.

## 2016-04-10 NOTE — Telephone Encounter (Signed)
-----   Message from Grace Fells, RN sent at 04/09/2016  5:03 PM EST ----- Regarding: First time Follow-Up: MD Richardson Dopp: [phone] Samuel Jester, a patient of MD Burr Medico, received Taxol and Carboplatin for the first time today which she tolerated well without and complaints.

## 2016-04-12 ENCOUNTER — Encounter: Payer: Self-pay | Admitting: Hematology

## 2016-04-16 ENCOUNTER — Ambulatory Visit: Payer: 59

## 2016-04-16 ENCOUNTER — Ambulatory Visit (HOSPITAL_BASED_OUTPATIENT_CLINIC_OR_DEPARTMENT_OTHER): Payer: 59

## 2016-04-16 ENCOUNTER — Other Ambulatory Visit (HOSPITAL_BASED_OUTPATIENT_CLINIC_OR_DEPARTMENT_OTHER): Payer: 59

## 2016-04-16 ENCOUNTER — Telehealth: Payer: Self-pay | Admitting: Hematology

## 2016-04-16 ENCOUNTER — Encounter: Payer: Self-pay | Admitting: *Deleted

## 2016-04-16 ENCOUNTER — Encounter: Payer: Self-pay | Admitting: Adult Health

## 2016-04-16 ENCOUNTER — Ambulatory Visit (HOSPITAL_BASED_OUTPATIENT_CLINIC_OR_DEPARTMENT_OTHER): Payer: 59 | Admitting: Adult Health

## 2016-04-16 VITALS — BP 128/76 | HR 82 | Temp 98.8°F | Resp 17 | Ht 63.0 in | Wt 191.1 lb

## 2016-04-16 DIAGNOSIS — Z171 Estrogen receptor negative status [ER-]: Secondary | ICD-10-CM | POA: Diagnosis not present

## 2016-04-16 DIAGNOSIS — Z5111 Encounter for antineoplastic chemotherapy: Secondary | ICD-10-CM | POA: Diagnosis not present

## 2016-04-16 DIAGNOSIS — C50412 Malignant neoplasm of upper-outer quadrant of left female breast: Secondary | ICD-10-CM

## 2016-04-16 DIAGNOSIS — G6289 Other specified polyneuropathies: Secondary | ICD-10-CM | POA: Diagnosis not present

## 2016-04-16 DIAGNOSIS — G360 Neuromyelitis optica [Devic]: Secondary | ICD-10-CM

## 2016-04-16 DIAGNOSIS — R748 Abnormal levels of other serum enzymes: Secondary | ICD-10-CM

## 2016-04-16 DIAGNOSIS — Z95828 Presence of other vascular implants and grafts: Secondary | ICD-10-CM

## 2016-04-16 LAB — CBC WITH DIFFERENTIAL/PLATELET
BASO%: 1.5 % (ref 0.0–2.0)
Basophils Absolute: 0.1 10*3/uL (ref 0.0–0.1)
EOS%: 0.9 % (ref 0.0–7.0)
Eosinophils Absolute: 0 10*3/uL (ref 0.0–0.5)
HCT: 30.2 % — ABNORMAL LOW (ref 34.8–46.6)
HGB: 10.3 g/dL — ABNORMAL LOW (ref 11.6–15.9)
LYMPH%: 16.5 % (ref 14.0–49.7)
MCH: 30.2 pg (ref 25.1–34.0)
MCHC: 34.1 g/dL (ref 31.5–36.0)
MCV: 88.6 fL (ref 79.5–101.0)
MONO#: 0.2 10*3/uL (ref 0.1–0.9)
MONO%: 7.3 % (ref 0.0–14.0)
NEUT#: 2.4 10*3/uL (ref 1.5–6.5)
NEUT%: 73.8 % (ref 38.4–76.8)
Platelets: 193 10*3/uL (ref 145–400)
RBC: 3.41 10*6/uL — ABNORMAL LOW (ref 3.70–5.45)
RDW: 16.3 % — ABNORMAL HIGH (ref 11.2–14.5)
WBC: 3.3 10*3/uL — ABNORMAL LOW (ref 3.9–10.3)
lymph#: 0.5 10*3/uL — ABNORMAL LOW (ref 0.9–3.3)

## 2016-04-16 LAB — COMPREHENSIVE METABOLIC PANEL
ALT: 73 U/L — ABNORMAL HIGH (ref 0–55)
AST: 33 U/L (ref 5–34)
Albumin: 4 g/dL (ref 3.5–5.0)
Alkaline Phosphatase: 102 U/L (ref 40–150)
Anion Gap: 9 mEq/L (ref 3–11)
BUN: 12.8 mg/dL (ref 7.0–26.0)
CO2: 22 mEq/L (ref 22–29)
Calcium: 9.2 mg/dL (ref 8.4–10.4)
Chloride: 108 mEq/L (ref 98–109)
Creatinine: 0.6 mg/dL (ref 0.6–1.1)
EGFR: >90 mL/min/{1.73_m2} (ref >90)
Glucose: 100 mg/dl (ref 70–140)
Potassium: 4.1 mEq/L (ref 3.5–5.1)
Sodium: 139 mEq/L (ref 136–145)
Total Bilirubin: 0.61 mg/dL (ref 0.20–1.20)
Total Protein: 6.2 g/dL — ABNORMAL LOW (ref 6.4–8.3)

## 2016-04-16 MED ORDER — SODIUM CHLORIDE 0.9% FLUSH
10.0000 mL | INTRAVENOUS | Status: DC | PRN
  Administered 2016-04-16: 10 mL via INTRAVENOUS
  Filled 2016-04-16: qty 10

## 2016-04-16 MED ORDER — AZITHROMYCIN 250 MG PO TABS: ORAL_TABLET | ORAL | 0 refills | Status: DC

## 2016-04-16 MED ORDER — DIPHENHYDRAMINE HCL 50 MG/ML IJ SOLN
INTRAMUSCULAR | Status: AC
  Filled 2016-04-16: qty 1

## 2016-04-16 MED ORDER — DEXAMETHASONE SODIUM PHOSPHATE 10 MG/ML IJ SOLN
10.0000 mg | Freq: Once | INTRAMUSCULAR | Status: AC
  Administered 2016-04-16: 10 mg via INTRAVENOUS

## 2016-04-16 MED ORDER — FAMOTIDINE IN NACL 20-0.9 MG/50ML-% IV SOLN
20.0000 mg | Freq: Once | INTRAVENOUS | Status: AC
  Administered 2016-04-16: 20 mg via INTRAVENOUS

## 2016-04-16 MED ORDER — SODIUM CHLORIDE 0.9 % IV SOLN: 10.0000 mg | Freq: Once | INTRAVENOUS | Status: DC

## 2016-04-16 MED ORDER — HEPARIN SOD (PORK) LOCK FLUSH 100 UNIT/ML IV SOLN
500.0000 [IU] | Freq: Once | INTRAVENOUS | Status: AC | PRN
  Administered 2016-04-16: 500 [IU]
  Filled 2016-04-16: qty 5

## 2016-04-16 MED ORDER — PACLITAXEL CHEMO INJECTION 300 MG/50ML
80.0000 mg/m2 | Freq: Once | INTRAVENOUS | Status: AC
  Administered 2016-04-16: 162 mg via INTRAVENOUS
  Filled 2016-04-16: qty 27

## 2016-04-16 MED ORDER — DIPHENHYDRAMINE HCL 50 MG/ML IJ SOLN
25.0000 mg | Freq: Once | INTRAMUSCULAR | Status: AC
  Administered 2016-04-16: 25 mg via INTRAVENOUS

## 2016-04-16 MED ORDER — SODIUM CHLORIDE 0.9% FLUSH
10.0000 mL | INTRAVENOUS | Status: DC | PRN
  Administered 2016-04-16: 10 mL
  Filled 2016-04-16: qty 10

## 2016-04-16 MED ORDER — DEXAMETHASONE SODIUM PHOSPHATE 10 MG/ML IJ SOLN
INTRAMUSCULAR | Status: AC
  Filled 2016-04-16: qty 1

## 2016-04-16 MED ORDER — CARBOPLATIN CHEMO INJECTION 450 MG/45ML
286.4000 mg | Freq: Once | INTRAVENOUS | Status: AC
  Administered 2016-04-16: 290 mg via INTRAVENOUS
  Filled 2016-04-16: qty 29

## 2016-04-16 MED ORDER — SODIUM CHLORIDE 0.9 % IV SOLN
Freq: Once | INTRAVENOUS | Status: AC
  Administered 2016-04-16: 12:00:00 via INTRAVENOUS

## 2016-04-16 MED ORDER — FAMOTIDINE IN NACL 20-0.9 MG/50ML-% IV SOLN
INTRAVENOUS | Status: AC
  Filled 2016-04-16: qty 50

## 2016-04-16 NOTE — Telephone Encounter (Signed)
No los per 04/16/16 visit. °

## 2016-04-16 NOTE — Patient Instructions (Signed)
[facility] Discharge Instructions for Patients Receiving Chemotherapy  Today you received the following chemotherapy agents Taxol and Carboplatin. To help prevent nausea and vomiting after your treatment, we encourage you to take your nausea medication as directed.  If you develop nausea and vomiting that is not controlled by your nausea medication, call the clinic.   BELOW ARE SYMPTOMS THAT SHOULD BE REPORTED IMMEDIATELY:  *FEVER GREATER THAN 100.5 F  *CHILLS WITH OR WITHOUT FEVER  NAUSEA AND VOMITING THAT IS NOT CONTROLLED WITH YOUR NAUSEA MEDICATION  *UNUSUAL SHORTNESS OF BREATH  *UNUSUAL BRUISING OR BLEEDING  TENDERNESS IN MOUTH AND THROAT WITH OR WITHOUT PRESENCE OF ULCERS  *URINARY PROBLEMS  *BOWEL PROBLEMS  UNUSUAL RASH Items with * indicate a potential emergency and should be followed up as soon as possible.  Feel free to call the clinic you have any questions or concerns. The clinic phone number is [phone].  Please show the CHEMO ALERT CARD at check-in to the Emergency Department and triage nurse.    

## 2016-04-16 NOTE — Progress Notes (Signed)
[place]  [phone]) [phone] Fax:([phone]) [phone]  Clinic Follow up Note   Patient Care Team: Maisie Fus, MD as PCP - General (Obstetrics and Gynecology) Collene Gobble, MD as Attending Physician (Psychiatry) Rolm Bookbinder, MD as Consulting Physician (General Surgery) Truitt Merle, MD as Consulting Physician (Hematology) Kyung Rudd, MD as Consulting Physician (Radiation Oncology) 04/09/2016  CHIEF COMPLAINTS:  Follow up left breast triple negative cancer  Oncology History   Malignant neoplasm of upper-outer quadrant of left female breast [facility])   Staging form: Breast, AJCC 7th Edition   - Clinical stage from 01/29/2016: Stage IIB (T2, N1, M0) - Signed by Truitt Merle, MD on 02/06/2016      Malignant neoplasm of upper-outer quadrant of left female breast ([place])   01/28/2016 Mammogram    Diagnostic MM and US showed a 2.9cm (3.2X2.2X2.5cm on [place]) mass in [place], multiple enlarged left axillary nodes, largest 2.5cm.       01/29/2016 Initial Diagnosis    Malignant neoplasm of upper-outer quadrant of left female breast ([place])      01/29/2016 Initial Biopsy    Left breast mass and axillary node biopsy showed IDC, G3,       01/29/2016 Receptors her2    ER-, PR-, HER2-, Ki67 85%      02/11/2016 Imaging    Bilateral breast MRI with and without contrast showed a 3.3 x 2.6 x 2.6 cm mass in the upper outer left breast, and left axillary lymphadenopathy, at least 3 abnormal lymph nodes, no other additional sites of concern.      02/12/2016 Imaging    CT chest, abdomen and pelvis with contrast showed a 2.4 x 2.7 cm lesion in the upper outer left breast, left axillary node metastasis measuring up to 1.3 cm, no evidence of distant metastasis.      02/12/2016 Imaging    Bone scan was negative for skeletal metastasis.      02/13/2016 -  Chemotherapy    Dose dense Adriamycin 60 mg/m, Cytoxan 600 mg/m, every 2 weeks, for 4 cycles, followed by weekly carboplatin and Taxol  for 12 weeks       03/27/2016 Genetic Testing    Patient has genetic testing done for personal history of breast cancer, family history of cancer. ATM [id number] VUS identified on the Breast/GYN panel.  Negative genetic testing for the MSH2 inversion analysis ([place] inversion). The Breast/GYN gene panel offered by [facility] includes sequencing and rearrangement analysis for the following 23 genes:  ATM, BARD1, BRCA1, BRCA2, BRIP1, CDH1, CHEK2, EPCAM, FANCC, MLH1, MSH2, MSH6, MUTYH, NBN, NF1, PALB2, PMS2, POLD1, PTEN, RAD51C, RAD51D, RECQL, and TP53.         HISTORY OF PRESENTING ILLNESS:  Grace Moyer 56 y.o. female is here because of her recently diagnosed left breast cancer. She was accompanied By her husband to our multidisciplinary breast clinic today.  This was discovered by screening mammogram, she had no palpable breast mass or axilla note. She denies any other new symptoms. She underwent a diagnostic mammogram and ultrasound on 01/28/2016, which showed a 2.9 cm mass in the upper outer quadrant, and multiple enlarged left axillary nodes, largest 2.5 cm. She underwent core needle biopsy of the left breast mass and left axillary node, both showed invasive ductal carcinoma, grade 3, triple negative, Ki-67 85%.  She developed bilateral numbness and tingling of her legs and hands at the end of May 2017. She underwent a multiple scans, and lab test, and was finally seen by neurologist  Dr. Moshe Cipro, and was diagnosed with neuromyelitis optica spectrum disorder (NOSD). She initially received high-dose steroids, and then started Rituxan infusion, status post 2 infusions, now is on every four-month schedule, next due in Jan 2018. She states her neuropathy has been stable, she denies any significant vision problem, or other neurological symptoms. Her hand function and gait are normal. She is an Clinical biochemist.   GYN HISTORY  Menarchal: 13 LMP: 62 (hysterectomy) Contraceptive: 3 years  HRT: n/a  G1P1: 7  yo daughter   CURRENT THERAPY: Dose dense Adriamycin 60 mg/m, Cytoxan 600 mg/m, every 2 weeks, for 4 cycles, followed by weekly carboplatin and Taxol for 12 weeks started 02/13/2016  INTERIM HISTORY Grace Moyer returns for follow-up and evaluation of her left breast cancer prior to neoadjuvant treatment with Taxol/Carbo.  This week is week two.  She is doing well today.  She tolerated her first week without difficulty.  She does have a persistent nasal drainage that has been present for about a week and is concerned it may be an early sinusitis.  She also has baseline neuromyelitis optica receiving rituximab monthly and her neuropathy is stable.  She has been taking tylenol cold and nyquil this week due to her cold symptoms.  She denies fevers, chills, cough, shortness of breath, pleuritic chest pain, teeth pain, nausea, vomiting, or any further concerns today.    MEDICAL HISTORY:  Past Medical History:  Diagnosis Date  . Anxiety   . Family history of breast cancer   . Family history of colon cancer   . Malignant neoplasm of upper-outer quadrant of left female breast ([place]) 01/31/2016  . Neuromyelitis optica ([place])     SURGICAL HISTORY: Past Surgical History:  Procedure Laterality Date  . ABDOMINAL HYSTERECTOMY     partial  . EYE SURGERY    . PORTACATH PLACEMENT Right 02/11/2016   Procedure: INSERTION PORT-A-CATH WITH [place];  Surgeon: Rolm Bookbinder, MD;  Location: [facility];  Service: General;  Laterality: Right;    SOCIAL HISTORY: Social History   Social History  . Marital status: Married    Spouse name: N/A  . Number of children: N/A  . Years of education: N/A   Occupational History  . Not on file.   Social History Main Topics  . Smoking status: Former Smoker    Types: Cigarettes    Quit date: 09/03/2001  . Smokeless tobacco: Never Used  . Alcohol use No  . Drug use: No  . Sexual activity: Not on file   Other Topics Concern  . Not on file   Social  History Narrative  . No narrative on file    FAMILY HISTORY: Family History  Problem Relation Age of Onset  . Breast cancer Maternal Grandmother     dx in her 76s  . CAD Paternal Grandmother   . Colon cancer Paternal Uncle     dx in his 1s  . COPD Maternal Grandfather   . CAD Paternal Grandfather   . Breast cancer Cousin     dx in her early to mid 36s; maternal first cousin  . Lung cancer Paternal Uncle     ALLERGIES:  has No Known Allergies.  MEDICATIONS:  Current Outpatient Prescriptions  Medication Sig Dispense Refill  . Cholecalciferol (VITAMIN D-1000 MAX ST) 1000 units tablet Take 2 tablets by mouth daily.    Marland Kitchen HYDROcodone-acetaminophen (NORCO) 10-325 MG tablet Take 1 tablet by mouth every 6 (six) hours as needed. 10 tablet 0  . lidocaine-prilocaine (EMLA)  cream Apply to affected area once 30 g 3  . omeprazole (PRILOSEC) 40 MG capsule Take 40 mg by mouth daily.    . ondansetron (ZOFRAN) 8 MG tablet Take 1 tablet (8 mg total) by mouth 2 (two) times daily as needed. Start on the third day after chemotherapy. 30 tablet 1  . prochlorperazine (COMPAZINE) 10 MG tablet Take 1 tablet (10 mg total) by mouth every 6 (six) hours as needed (Nausea or vomiting). 30 tablet 1  . riTUXimab (RITUXAN) 100 MG/10ML injection Inject into the vein every 4 (four) months.    Marland Kitchen azithromycin (ZITHROMAX) 250 MG tablet 2 tab on day 1, then one tab a day until complete 6 each 0   No current facility-administered medications for this visit.     REVIEW OF SYSTEMS:   Constitutional: Denies fevers, chills or abnormal night sweats (+) taste change with treatment Eyes: Denies blurriness of vision, double vision or watery eyes Ears, nose, mouth, throat, and face: Denies mucositis or sore throat Respiratory: Denies cough, dyspnea or wheezes Cardiovascular: Denies palpitation, chest discomfort or lower extremity swelling Gastrointestinal:  Denies nausea, heartburn (+) mild diarrhea with treatment Skin:  Denies abnormal skin rashes Lymphatics: Denies new lymphadenopathy or easy bruising Neurological:Denies numbness or new weaknesses (+) tingling, neuropathy stable Behavioral/Psych: Mood is stable, no new changes  Musculoskeletal: (+) occasional soreness in left axilla All other systems were reviewed with the patient and are negative.  PHYSICAL EXAMINATION: ECOG PERFORMANCE STATUS: 1 - Symptomatic but completely ambulatory  Vitals:   04/16/16 1102  BP: 128/76  Pulse: 82  Resp: 17  Temp: 98.8 F (37.1 C)   Filed Weights   04/16/16 1102  Weight: 191 lb 1.6 oz (86.7 kg)   GENERAL: Patient is a well appearing female in no acute distress HEENT:  Sclerae anicteric.  Oropharynx clear and moist. No ulcerations or evidence of oropharyngeal candidiasis. Neck is supple. Bilateral ear canals are impacted with cerumen, No frontal or maxillary sinus tenderness.  NODES:  No cervical, supraclavicular, infraclavicular or axillary lymphadenopathy palpated.  BREAST EXAM:  Deferred. LUNGS:  Clear to auscultation bilaterally.  No wheezes or rhonchi. HEART:  Regular rate and rhythm. No murmur appreciated. ABDOMEN:  Soft, nontender.  Positive, normoactive bowel sounds. No organomegaly palpated. MSK:  No focal spinal tenderness to palpation. Full range of motion bilaterally in the upper extremities. EXTREMITIES:  No peripheral edema.   SKIN:  Clear with no obvious rashes or skin changes. No nail dyscrasia. NEURO:  Nonfocal. Well oriented.  Appropriate affect.     LABORATORY DATA:  I have reviewed the data as listed CBC Latest Ref Rng & Units 04/16/2016 04/09/2016 03/26/2016  WBC 3.9 - 10.3 10e3/uL 3.3(L) 7.6 8.4  Hemoglobin 11.6 - 15.9 g/dL 10.3(L) 10.8(L) 11.4(L)  Hematocrit 34.8 - 46.6 % 30.2(L) 32.4(L) 32.6(L)  Platelets 145 - 400 10e3/uL 193 128(L) 131(L)   CMP Latest Ref Rng & Units 04/16/2016 04/09/2016 03/26/2016  Glucose 70 - 140 mg/dl 100 90 86  BUN 7.0 - 26.0 mg/dL 12.8 6.0(L) 8.6    Creatinine 0.6 - 1.1 mg/dL 0.6 0.6 0.7  Sodium 136 - 145 mEq/L 139 140 141  Potassium 3.5 - 5.1 mEq/L 4.1 3.9 4.1  Chloride 101 - 111 mmol/L - - -  CO2 22 - 29 mEq/L _0 Calcium 8.4 - 10.4 mg/dL 9.2 9.0 8.9  Total Protein 6.4 - 8.3 g/dL 6.2(L) 5.9(L) 6.2(L)  Total Bilirubin 0.20 - 1.20 mg/dL 0.61 0.49 0.46  Alkaline Phos  40 - 150 U/L 102 95 95  AST 5 - 34 U/L 33 19 16  ALT 0 - 55 U/L 73(H) 30 24   PATHOLOGY REPORT  Diagnosis 01/29/2016 1. Breast, left, needle core biopsy, 1:30 o'clock - INVASIVE DUCTAL CARCINOMA, GRADE 3. - LYMPHOVASCULAR INVOLVEMENT BY TUMOR. 2. Lymph node, needle/core biopsy, left axillary - METASTATIC CARCINOMA. Microscopic Comment 1. , 2. A breast prognostic profile will be performed  1. Results: IMMUNOHISTOCHEMICAL AND MORPHOMETRIC ANALYSIS PERFORMED MANUALLY Estrogen Receptor: 0%, NEGATIVE Progesterone Receptor: 0%, NEGATIVE Proliferation Marker Ki67: 85%  Results: HER2 - NEGATIVE RATIO OF HER2/CEP17 SIGNALS 1.26 AVERAGE HER2 COPY NUMBER PER CELL 1.95  2. FLUORESCENCE IN-SITU HYBRIDIZATION Results: HER2 - NEGATIVE RATIO OF HER2/CEP17 SIGNALS 1.25 AVERAGE HER2 COPY NUMBER PER CELL 2.00  Results: IMMUNOHISTOCHEMICAL AND MORPHOMETRIC ANALYSIS PERFORMED MANUALLY Estrogen Receptor: 0%, NEGATIVE Progesterone Receptor: <1%, NEGATIVE, WEAK STAINING INTENSITY Proliferation Marker Ki67: 90%  RADIOGRAPHIC STUDIES: I have personally reviewed the radiological images as listed and agreed with the findings in the report. No results found.  Bone scan 02/12/2016 IMPRESSION: No evidence of skeletal metastatic disease.  CT chest abdomen pelvis w contrast 02/12/2016 IMPRESSION: 2.4 x 2.7 cm lesion in the upper outer left breast, corresponding to the patient's known breast neoplasm. Left axillary nodal metastasis measuring up to 13 mm short axis. No evidence of distant metastasis.  ASSESSMENT & PLAN: 56 y.o. Caucasian woman with past medical  history of depression, presented with a screening mammogram discovered left breast cancer   1. Malignant neoplasm of upper-outer quadrant of left breast, invasive ductal carcinoma, G3, cT2N1M0, stage IIB, ER-/PR-/HER2- -I previously reviewed her imaging findings and the biopsy results in great detail with patient and her husband. -I previously discussed her breast MRI, CT scan and bone scan findings, which all confirmed the left breast lesion and axillary adenopathy, no other distant metastasis. -she was seen by Dr. Donne Hazel and surgical options were discussed  -We previously discussed that triple negative breast cancers are much more aggressive, and her risk of cancer recurrence after breast surgery is pretty high, giving the multiple positive lymph nodes. I recommend her to consider neoadjuvant chemotherapy, to reduce her risk of recurrence, and downstage her breast cancer, to make lumpectomy and sentinel lymph node biopsy as a more feasible surgery. -She is undergoing dose dense Adriamycin, Cytoxan every 2 weeks, with Neulasta support, for 4 cycles, followed by weekly carboplatin and Taxol for 12 weeks  -the goal of therapy is curative  -Due to her underlying NOSD, and existing peripheral neuropathy, my main concern of long-term side effects from chemotherapy is worsening neuropathy, especially from Taxol, we will monitor closely, and may need to hold it if her neuropathy gets worse.  -Her baseline echocardiogram was normal. -Potential side effects from [place] and Taxol were reviewed with patient, especially neuropathy and cytopenia, possibility of blood transfusion, she agrees to proceed.  2. Genetics -Given her young age and triple negative disease, we recommend her to have genetic testing to ruled out inheritable breast cancer syndrome, and the test result may impact her surgery. -Genetic testing pending.  3. neuromyelitis optica spectrum disorder (NOSD) -she will continue follow up with her  neurologist Dr. Moshe Cipro at [facility]  -she is on rituximab every 4 weeks, next due in March 21, 2016. It will be OK to continue her Rituximab when she is on chemotherapy, but the combination therapy will further compromise her immune system. - Dr. Moshe Cipro is aware of her breast cancer treatment, he is agreeable. -She  received Rituxan in our cancer center on 03/21/2016, tolerated generally well   Plan Grace Moyer is doing well today.  She will proceed with chemotherapy.  I reviewed her labs with her which are stable.  Her ALT is up to 72, we reviewed her tylenol use, which doesn't sound excessive and she isn't drinking ETOH.  Her ALT has increased in past and then decreased the following week.  Will see where it is next week.  She has very slight peripheral neuropathy (due to NMO).  I reviewed with her exactly what to pay attention to.  I reviewed the risks of worsening peripheral neuropathy along with the concern of possible irreversibility and she will let us know if it gets worse.  I also prescribed a Zpak for early sinusitis.  Grace Moyer is in agreement with the above plan and knows to call for any questions or concerns should they arise.    A total of (30) minutes of face-to-face time was spent with this patient with greater than 50% of that time in counseling and care-coordination.   Charlestine Massed, NP 04/16/2016

## 2016-04-22 NOTE — Progress Notes (Signed)
[place]  [phone] Fax:([phone]) [phone]  Clinic Follow up Note   Patient Care Team: Grace Fus, MD as PCP - General (Obstetrics and Gynecology) Grace Gobble, MD as Attending Physician (Psychiatry) Grace Bookbinder, MD as Consulting Physician (General Surgery) Grace Merle, MD as Consulting Physician (Hematology) Grace Rudd, MD as Consulting Physician (Radiation Oncology) 04/09/2016  CHIEF COMPLAINTS:  Follow up left breast triple negative cancer  Oncology History   Malignant neoplasm of upper-outer quadrant of left female breast [facility])   Staging form: Breast, AJCC 7th Edition   - Clinical stage from 01/29/2016: Stage IIB (T2, N1, M0) - Signed by Grace Merle, MD on 02/06/2016      Malignant neoplasm of upper-outer quadrant of left female breast ([place])   01/28/2016 Mammogram    Diagnostic MM and US showed a 2.9cm (3.2X2.2X2.5cm on [place]) mass in [place], multiple enlarged left axillary nodes, largest 2.5cm.       01/29/2016 Initial Diagnosis    Malignant neoplasm of upper-outer quadrant of left female breast ([place])      01/29/2016 Initial Biopsy    Left breast mass and axillary node biopsy showed IDC, G3,       01/29/2016 Receptors her2    ER-, PR-, HER2-, Ki67 85%      02/11/2016 Imaging    Bilateral breast MRI with and without contrast showed a 3.3 x 2.6 x 2.6 cm mass in the upper outer left breast, and left axillary lymphadenopathy, at least 3 abnormal lymph nodes, no other additional sites of concern.      02/12/2016 Imaging    CT chest, abdomen and pelvis with contrast showed a 2.4 x 2.7 cm lesion in the upper outer left breast, left axillary node metastasis measuring up to 1.3 cm, no evidence of distant metastasis.      02/12/2016 Imaging    Bone scan was negative for skeletal metastasis.      02/13/2016 -  Chemotherapy    Dose dense Adriamycin 60 mg/m, Cytoxan 600 mg/m, every 2 weeks, for 4 cycles, followed by weekly carboplatin and Taxol  for 12 weeks       03/27/2016 Genetic Testing    Patient has genetic testing done for personal history of breast cancer, family history of cancer. ATM c.2606C>T VUS identified on the Breast/GYN panel.  Negative genetic testing for the MSH2 inversion analysis ([place] inversion). The Breast/GYN gene panel offered by [facility] includes sequencing and rearrangement analysis for the following 23 genes:  ATM, BARD1, BRCA1, BRCA2, BRIP1, CDH1, CHEK2, EPCAM, FANCC, MLH1, MSH2, MSH6, MUTYH, NBN, NF1, PALB2, PMS2, POLD1, PTEN, RAD51C, RAD51D, RECQL, and TP53.         HISTORY OF PRESENTING ILLNESS:  Grace Moyer 56 y.o. female is here because of her recently diagnosed left breast cancer. She was accompanied By her husband to our multidisciplinary breast clinic today.  This was discovered by screening mammogram, she had no palpable breast mass or axilla note. She denies any other new symptoms. She underwent a diagnostic mammogram and ultrasound on 01/28/2016, which showed a 2.9 cm mass in the upper outer quadrant, and multiple enlarged left axillary nodes, largest 2.5 cm. She underwent core needle biopsy of the left breast mass and left axillary node, both showed invasive ductal carcinoma, grade 3, triple negative, Ki-67 85%.  She developed bilateral numbness and tingling of her legs and hands at the end of May 2017. She underwent a multiple scans, and lab test, and was finally seen by neurologist  Dr. Moshe Moyer, and was diagnosed with neuromyelitis optica spectrum disorder (NOSD). She initially received high-dose steroids, and then started Rituxan infusion, status post 2 infusions, now is on every four-month schedule, next due in Jan 2018. She states her neuropathy has been stable, she denies any significant vision problem, or other neurological symptoms. Her hand function and gait are normal. She is an Clinical biochemist.   GYN HISTORY  Menarchal: 13 LMP: 23 (hysterectomy) Contraceptive: 3 years  HRT: n/a  G1P1: 44  yo daughter   CURRENT THERAPY: Dose dense Adriamycin 60 mg/m, Cytoxan 600 mg/m, every 2 weeks, for 4 cycles, followed by weekly carboplatin and Taxol for 12 weeks started 02/13/2016  INTERIM HISTORY Grace Moyer returns for follow-up and cycle 3 carbo and taxol. The patient reports no issues with chemotherapy at this time, other than some neuropathy which is unchanged. The patient reports some difficulty maintaining her body temperature, and wonders if she could be going through menopause. She reports significant fatigue over the weekends, and is working 5 days a week at this time. She experiences some occasional discomfort to her breast.  MEDICAL HISTORY:  Past Medical History:  Diagnosis Date  . Anxiety   . Family history of breast cancer   . Family history of colon cancer   . Malignant neoplasm of upper-outer quadrant of left female breast ([place]) 01/31/2016  . Neuromyelitis optica ([place])     SURGICAL HISTORY: Past Surgical History:  Procedure Laterality Date  . ABDOMINAL HYSTERECTOMY     partial  . EYE SURGERY    . PORTACATH PLACEMENT Right 02/11/2016   Procedure: INSERTION PORT-A-CATH WITH [place];  Surgeon: Grace Bookbinder, MD;  Location: [facility];  Service: General;  Laterality: Right;    SOCIAL HISTORY: Social History   Social History  . Marital status: Married    Spouse name: N/A  . Number of children: N/A  . Years of education: N/A   Occupational History  . Not on file.   Social History Main Topics  . Smoking status: Former Smoker    Types: Cigarettes    Quit date: 09/03/2001  . Smokeless tobacco: Never Used  . Alcohol use No  . Drug use: No  . Sexual activity: Not on file   Other Topics Concern  . Not on file   Social History Narrative  . No narrative on file    FAMILY HISTORY: Family History  Problem Relation Age of Onset  . Breast cancer Maternal Grandmother     dx in her 78s  . CAD Paternal Grandmother   . Colon cancer Paternal Uncle      dx in his 33s  . COPD Maternal Grandfather   . CAD Paternal Grandfather   . Breast cancer Cousin     dx in her early to mid 69s; maternal first cousin  . Lung cancer Paternal Uncle     ALLERGIES:  has No Known Allergies.  MEDICATIONS:  Current Outpatient Prescriptions  Medication Sig Dispense Refill  . Cholecalciferol (VITAMIN D-1000 MAX ST) 1000 units tablet Take 2 tablets by mouth daily.    Marland Kitchen HYDROcodone-acetaminophen (NORCO) 10-325 MG tablet Take 1 tablet by mouth every 6 (six) hours as needed. 15 tablet 0  . lidocaine-prilocaine (EMLA) cream Apply to affected area once 30 g 3  . omeprazole (PRILOSEC) 40 MG capsule Take 40 mg by mouth daily.    . ondansetron (ZOFRAN) 8 MG tablet Take 1 tablet (8 mg total) by mouth 2 (two) times daily as needed. Start  on the third day after chemotherapy. 30 tablet 1  . prochlorperazine (COMPAZINE) 10 MG tablet Take 1 tablet (10 mg total) by mouth every 6 (six) hours as needed (Nausea or vomiting). 30 tablet 1  . riTUXimab (RITUXAN) 100 MG/10ML injection Inject into the vein every 4 (four) months.     No current facility-administered medications for this visit.     REVIEW OF SYSTEMS:   Constitutional: Denies fevers, chills or abnormal night sweats (+) hot flashes, (+) fatigue Eyes: Denies blurriness of vision, double vision or watery eyes Ears, nose, mouth, throat, and face: Denies mucositis or sore throat Respiratory: Denies cough, dyspnea or wheezes Cardiovascular: Denies palpitation, chest discomfort or lower extremity swelling Gastrointestinal:  Denies nausea, heartburn (+) diarrhea with treatment Skin: Denies abnormal skin rashes Lymphatics: Denies new lymphadenopathy or easy bruising Neurological:Denies numbness or new weaknesses (+) tingling, neuropathy stable Behavioral/Psych: Mood is stable, no new changes  Musculoskeletal: (+) occasional soreness in left axilla All other systems were reviewed with the patient and are  negative.  PHYSICAL EXAMINATION: ECOG PERFORMANCE STATUS: 1 - Symptomatic but completely ambulatory  Vitals:   04/23/16 1324  BP: 117/60  Pulse: 75  Resp: 18  Temp: 99 F (37.2 C)   Filed Weights   04/23/16 1324  Weight: 192 lb 8 oz (87.3 kg)    GENERAL:alert, no distress and comfortable.  SKIN: skin color, texture, turgor are normal, no rashes or significant lesions EYES: normal, conjunctiva are pink and non-injected, sclera clear OROPHARYNX:no exudate, no erythema and lips, buccal mucosa, and tongue normal  NECK: supple, thyroid normal size, non-tender, without nodularity. (+) Incision in the right lower neck for port. Port appears clean, neck not significantly swollen, no erythema, no sign of infection. LYMPH:  no palpable lymphadenopathy in the cervical, axillary or inguinal LUNGS: clear to auscultation and percussion with normal breathing effort HEART: regular rate & rhythm and no murmurs and no lower extremity edema ABDOMEN:abdomen soft, non-tender and normal bowel sounds MUSCULOSKELETALl:no cyanosis of digits and no clubbing  PSYCH: alert & oriented x 3 with fluent speech NEURO: no focal motor/sensory deficits (+) vibration test is normal Breasts: Breast inspection showed them to be symmetrical with no nipple discharge. Palpation of the breasts and axilla revealed no obvious mass that I could appreciate, there is fullness at the upper-out quadrant of left breast.    LABORATORY DATA:  I have reviewed the data as listed CBC Latest Ref Rng & Units 04/23/2016 04/16/2016 04/09/2016  WBC 3.9 - 10.3 10e3/uL 3.4(L) 3.3(L) 7.6  Hemoglobin 11.6 - 15.9 g/dL 11.0(L) 10.3(L) 10.8(L)  Hematocrit 34.8 - 46.6 % 32.0(L) 30.2(L) 32.4(L)  Platelets 145 - 400 10e3/uL 246 193 128(L)   CMP Latest Ref Rng & Units 04/23/2016 04/16/2016 04/09/2016  Glucose 70 - 140 mg/dl 87 100 90  BUN 7.0 - 26.0 mg/dL 9.7 12.8 6.0(L)  Creatinine 0.6 - 1.1 mg/dL 0.7 0.6 0.6  Sodium 136 - 145 mEq/L 141 139 140   Potassium 3.5 - 5.1 mEq/L 4.0 4.1 3.9  Chloride 101 - 111 mmol/L - - -  CO2 22 - 29 mEq/L '23 22 24  '$ Calcium 8.4 - 10.4 mg/dL 9.7 9.2 9.0  Total Protein 6.4 - 8.3 g/dL 6.7 6.2(L) 5.9(L)  Total Bilirubin 0.20 - 1.20 mg/dL 0.80 0.61 0.49  Alkaline Phos 40 - 150 U/L 82 102 95  AST 5 - 34 U/L 26 33 19  ALT 0 - 55 U/L 49 73(H) 30   PATHOLOGY REPORT  Diagnosis 01/29/2016 1.  Breast, left, needle core biopsy, 1:30 o'clock - INVASIVE DUCTAL CARCINOMA, GRADE 3. - LYMPHOVASCULAR INVOLVEMENT BY TUMOR. 2. Lymph node, needle/core biopsy, left axillary - METASTATIC CARCINOMA.  RADIOGRAPHIC STUDIES: I have personally reviewed the radiological images as listed and agreed with the findings in the report. No results found.  Bone scan 02/12/2016 IMPRESSION: No evidence of skeletal metastatic disease.  CT chest abdomen pelvis w contrast 02/12/2016 IMPRESSION: 2.4 x 2.7 cm lesion in the upper outer left breast, corresponding to the patient's known breast neoplasm. Left axillary nodal metastasis measuring up to 13 mm short axis. No evidence of distant metastasis.  ASSESSMENT & PLAN: 56 y.o. Caucasian woman with past medical history of depression, presented with a screening mammogram discovered left breast cancer   1. Malignant neoplasm of upper-outer quadrant of left breast, invasive ductal carcinoma, G3, cT2N1M0, stage IIB, ER-/PR-/HER2- -I previously reviewed her imaging findings and the biopsy results in great detail with patient and her husband. -I previously discussed her breast MRI, CT scan and bone scan findings, which all confirmed the left breast lesion and axillary adenopathy, no other distant metastasis. -she was previously seen by Dr. Donne Hazel and surgical options were discussed  -We previously discussed that triple negative breast cancers are much more aggressive, and her risk of cancer recurrence after breast surgery is pretty high, giving the multiple positive lymph nodes. I  recommend her to consider neoadjuvant chemotherapy, to reduce her risk of recurrence, and downstage her breast cancer, to make lumpectomy and sentinel lymph node biopsy as a more feasible surgery. -I previously recommend her to have dose dense Adriamycin, Cytoxan every 2 weeks, with Neulasta support, for 4 cycles, followed by weekly carboplatin and Taxol for 12 weeks  -the goal of therapy is curative  -Due to her underlying NOSD, and existing peripheral neuropathy, my main concern of long-term side effects from chemotherapy is worsening neuropathy, especially from Taxol, we will monitor closely, and may need to hold it if her neuropathy gets worse.  -Her baseline echocardiogram was normal. -Lab reviewed, mild anemia, overall stable, adequate for treatment, we'll proceed third cycle carbo and taxol. -I again reviewed the potential side effects from [place] and Taxol, especially neuropathy and cytopenia, possibility of blood transfusion, she agrees to proceed. -The patient still experiences occasional discomfort. I will refill the patient's hydrocodone today.  2. Genetics -Given her young age and triple negative disease, we recommend her to have genetic testing to rule out inheritable breast cancer syndrome, and the test result may impact her surgery. -ATM c.2606C>T VUS identified on the Breast/Gyn panel.  Negative genetic testing for the MSH2 inversion analysis ([place]and inversion). The Breast/GYN gene panel offered by [facility] includes sequencing and rearrangement analysis for the following 23 genes:  ATM, BARD1, BRCA1, BRCA2, BRIP1, CDH1, CHEK2, EPCAM, FANCC, MLH1, MSH2, MSH6, MUTYH, NBN, NF1, PALB2, PMS2, POLD1, PTEN, RAD51C, RAD51D, RECQL, and TP53.   The report date is March 27, 2016  3. neuromyelitis optica spectrum disorder (NOSD) -she will continue follow up with her neurologist Dr. Moshe [facility] at [facility]  -she is on rituximab every 4 weeks. It will be OK to continue her Rituximab when she is on  chemotherapy, but the combination therapy will further compromise her immune system. -I have spoken with Dr. Moshe Moyer about her breast cancer treatment, he is agreeable. -She received Rituxan in our cancer center on 03/21/2016, tolerated generally well.  4. Anemia secondary to chemo -overall mild and stable -We'll monitor closely  Plan -I will see her back in  2 weeks and again in 4 weeks. -lab reviewed and proceed with cycle 3 carbo and taxol today, and continue weekly. -I will refill her hydrocodone today.   No orders of the defined types were placed in this encounter.   All questions were answered. The patient knows to call the clinic with any problems, questions or concerns.  I spent 20 minutes counseling the patient face to face. The total time spent in the appointment was 25 minutes and more than 50% was on counseling.  This document serves as a record of services personally performed by Grace Merle, MD. It was created on her behalf by Maryla Morrow, a trained medical scribe. The creation of this record is based on the scribe's personal observations and the provider's statements to them. This document has been checked and approved by the attending provider.     Grace Merle, MD 04/23/2016

## 2016-04-23 ENCOUNTER — Ambulatory Visit: Payer: 59

## 2016-04-23 ENCOUNTER — Encounter: Payer: Self-pay | Admitting: Hematology

## 2016-04-23 ENCOUNTER — Ambulatory Visit (HOSPITAL_BASED_OUTPATIENT_CLINIC_OR_DEPARTMENT_OTHER): Payer: 59

## 2016-04-23 ENCOUNTER — Other Ambulatory Visit (HOSPITAL_BASED_OUTPATIENT_CLINIC_OR_DEPARTMENT_OTHER): Payer: 59

## 2016-04-23 ENCOUNTER — Ambulatory Visit (HOSPITAL_BASED_OUTPATIENT_CLINIC_OR_DEPARTMENT_OTHER): Payer: 59 | Admitting: Hematology

## 2016-04-23 VITALS — BP 117/60 | HR 75 | Temp 99.0°F | Resp 18 | Ht 63.0 in | Wt 192.5 lb

## 2016-04-23 DIAGNOSIS — D6481 Anemia due to antineoplastic chemotherapy: Secondary | ICD-10-CM

## 2016-04-23 DIAGNOSIS — C50412 Malignant neoplasm of upper-outer quadrant of left female breast: Secondary | ICD-10-CM

## 2016-04-23 DIAGNOSIS — Z171 Estrogen receptor negative status [ER-]: Secondary | ICD-10-CM | POA: Diagnosis not present

## 2016-04-23 DIAGNOSIS — Z95828 Presence of other vascular implants and grafts: Secondary | ICD-10-CM

## 2016-04-23 DIAGNOSIS — T451X5A Adverse effect of antineoplastic and immunosuppressive drugs, initial encounter: Secondary | ICD-10-CM | POA: Insufficient documentation

## 2016-04-23 DIAGNOSIS — G360 Neuromyelitis optica [Devic]: Secondary | ICD-10-CM

## 2016-04-23 DIAGNOSIS — Z5111 Encounter for antineoplastic chemotherapy: Secondary | ICD-10-CM

## 2016-04-23 LAB — COMPREHENSIVE METABOLIC PANEL
ALT: 49 U/L (ref 0–55)
AST: 26 U/L (ref 5–34)
Albumin: 4.3 g/dL (ref 3.5–5.0)
Alkaline Phosphatase: 82 U/L (ref 40–150)
Anion Gap: 10 mEq/L (ref 3–11)
BUN: 9.7 mg/dL (ref 7.0–26.0)
CO2: 23 mEq/L (ref 22–29)
Calcium: 9.7 mg/dL (ref 8.4–10.4)
Chloride: 109 mEq/L (ref 98–109)
Creatinine: 0.7 mg/dL (ref 0.6–1.1)
EGFR: >90 mL/min/{1.73_m2} (ref >90)
Glucose: 87 mg/dl (ref 70–140)
Potassium: 4 mEq/L (ref 3.5–5.1)
Sodium: 141 mEq/L (ref 136–145)
Total Bilirubin: 0.8 mg/dL (ref 0.20–1.20)
Total Protein: 6.7 g/dL (ref 6.4–8.3)

## 2016-04-23 LAB — CBC WITH DIFFERENTIAL/PLATELET
BASO%: 1 % (ref 0.0–2.0)
Basophils Absolute: 0 10*3/uL (ref 0.0–0.1)
EOS%: 1.6 % (ref 0.0–7.0)
Eosinophils Absolute: 0.1 10*3/uL (ref 0.0–0.5)
HCT: 32 % — ABNORMAL LOW (ref 34.8–46.6)
HGB: 11 g/dL — ABNORMAL LOW (ref 11.6–15.9)
LYMPH%: 22 % (ref 14.0–49.7)
MCH: 31.4 pg (ref 25.1–34.0)
MCHC: 34.4 g/dL (ref 31.5–36.0)
MCV: 91.3 fL (ref 79.5–101.0)
MONO#: 0.3 10*3/uL (ref 0.1–0.9)
MONO%: 9.2 % (ref 0.0–14.0)
NEUT#: 2.3 10*3/uL (ref 1.5–6.5)
NEUT%: 66.2 % (ref 38.4–76.8)
Platelets: 246 10*3/uL (ref 145–400)
RBC: 3.5 10*6/uL — ABNORMAL LOW (ref 3.70–5.45)
RDW: 18.7 % — ABNORMAL HIGH (ref 11.2–14.5)
WBC: 3.4 10*3/uL — ABNORMAL LOW (ref 3.9–10.3)
lymph#: 0.8 10*3/uL — ABNORMAL LOW (ref 0.9–3.3)

## 2016-04-23 MED ORDER — DIPHENHYDRAMINE HCL 50 MG/ML IJ SOLN
INTRAMUSCULAR | Status: AC
  Filled 2016-04-23: qty 1

## 2016-04-23 MED ORDER — PACLITAXEL CHEMO INJECTION 300 MG/50ML
80.0000 mg/m2 | Freq: Once | INTRAVENOUS | Status: AC
  Administered 2016-04-23: 162 mg via INTRAVENOUS
  Filled 2016-04-23: qty 27

## 2016-04-23 MED ORDER — DEXAMETHASONE SODIUM PHOSPHATE 10 MG/ML IJ SOLN
10.0000 mg | Freq: Once | INTRAMUSCULAR | Status: AC
  Administered 2016-04-23: 10 mg via INTRAVENOUS

## 2016-04-23 MED ORDER — HEPARIN SOD (PORK) LOCK FLUSH 100 UNIT/ML IV SOLN
500.0000 [IU] | Freq: Once | INTRAVENOUS | Status: AC | PRN
  Administered 2016-04-23: 500 [IU]
  Filled 2016-04-23: qty 5

## 2016-04-23 MED ORDER — FAMOTIDINE IN NACL 20-0.9 MG/50ML-% IV SOLN
20.0000 mg | Freq: Once | INTRAVENOUS | Status: AC
  Administered 2016-04-23: 20 mg via INTRAVENOUS

## 2016-04-23 MED ORDER — HYDROCODONE-ACETAMINOPHEN 10-325 MG PO TABS: 1.0000 | ORAL_TABLET | Freq: Four times a day (QID) | ORAL | 0 refills | Status: DC | PRN

## 2016-04-23 MED ORDER — SODIUM CHLORIDE 0.9% FLUSH
10.0000 mL | INTRAVENOUS | Status: DC | PRN
  Administered 2016-04-23: 10 mL
  Filled 2016-04-23: qty 10

## 2016-04-23 MED ORDER — ALTEPLASE 2 MG IJ SOLR
2.0000 mg | Freq: Once | INTRAMUSCULAR | Status: DC | PRN
  Administered 2016-04-23: 2 mg
  Filled 2016-04-23: qty 2

## 2016-04-23 MED ORDER — DEXAMETHASONE SODIUM PHOSPHATE 10 MG/ML IJ SOLN
INTRAMUSCULAR | Status: AC
  Filled 2016-04-23: qty 1

## 2016-04-23 MED ORDER — SODIUM CHLORIDE 0.9 % IV SOLN
286.4000 mg | Freq: Once | INTRAVENOUS | Status: AC
  Administered 2016-04-23: 290 mg via INTRAVENOUS
  Filled 2016-04-23: qty 29

## 2016-04-23 MED ORDER — DIPHENHYDRAMINE HCL 50 MG/ML IJ SOLN
25.0000 mg | Freq: Once | INTRAMUSCULAR | Status: AC
  Administered 2016-04-23: 25 mg via INTRAVENOUS

## 2016-04-23 MED ORDER — SODIUM CHLORIDE 0.9 % IV SOLN
Freq: Once | INTRAVENOUS | Status: AC
  Administered 2016-04-23: 15:00:00 via INTRAVENOUS

## 2016-04-23 MED ORDER — SODIUM CHLORIDE 0.9% FLUSH
10.0000 mL | INTRAVENOUS | Status: DC | PRN
Start: 2016-04-23 — End: 2016-04-23
  Administered 2016-04-23: 10 mL via INTRAVENOUS
  Filled 2016-04-23: qty 10

## 2016-04-23 MED ORDER — FAMOTIDINE IN NACL 20-0.9 MG/50ML-% IV SOLN
INTRAVENOUS | Status: AC
  Filled 2016-04-23: qty 50

## 2016-04-23 NOTE — Progress Notes (Signed)
Pt flushed several times and repositioned. Only 2 cc return. Pt agreed to have blood drawn peripherally. Desk nurse for Dr. Burr Medico called. Cathflow released to be given by desk Dr. Burr Medico desk nurse.  Porsche Cates LPN

## 2016-04-23 NOTE — Patient Instructions (Signed)
[facility] Discharge Instructions for Patients Receiving Chemotherapy  Today you received the following chemotherapy agents Taxol and Carboplatin. To help prevent nausea and vomiting after your treatment, we encourage you to take your nausea medication as directed.  If you develop nausea and vomiting that is not controlled by your nausea medication, call the clinic.   BELOW ARE SYMPTOMS THAT SHOULD BE REPORTED IMMEDIATELY:  *FEVER GREATER THAN 100.5 F  *CHILLS WITH OR WITHOUT FEVER  NAUSEA AND VOMITING THAT IS NOT CONTROLLED WITH YOUR NAUSEA MEDICATION  *UNUSUAL SHORTNESS OF BREATH  *UNUSUAL BRUISING OR BLEEDING  TENDERNESS IN MOUTH AND THROAT WITH OR WITHOUT PRESENCE OF ULCERS  *URINARY PROBLEMS  *BOWEL PROBLEMS  UNUSUAL RASH Items with * indicate a potential emergency and should be followed up as soon as possible.  Feel free to call the clinic you have any questions or concerns. The clinic phone number is [phone].  Please show the CHEMO ALERT CARD at check-in to the Emergency Department and triage nurse.    

## 2016-04-30 ENCOUNTER — Ambulatory Visit (HOSPITAL_BASED_OUTPATIENT_CLINIC_OR_DEPARTMENT_OTHER): Payer: 59

## 2016-04-30 ENCOUNTER — Ambulatory Visit: Payer: 59

## 2016-04-30 ENCOUNTER — Other Ambulatory Visit (HOSPITAL_BASED_OUTPATIENT_CLINIC_OR_DEPARTMENT_OTHER): Payer: 59

## 2016-04-30 VITALS — BP 133/77 | HR 74 | Temp 99.3°F | Resp 18

## 2016-04-30 DIAGNOSIS — Z95828 Presence of other vascular implants and grafts: Secondary | ICD-10-CM

## 2016-04-30 DIAGNOSIS — C50412 Malignant neoplasm of upper-outer quadrant of left female breast: Secondary | ICD-10-CM

## 2016-04-30 DIAGNOSIS — Z5111 Encounter for antineoplastic chemotherapy: Secondary | ICD-10-CM

## 2016-04-30 DIAGNOSIS — Z171 Estrogen receptor negative status [ER-]: Secondary | ICD-10-CM

## 2016-04-30 LAB — CBC WITH DIFFERENTIAL/PLATELET
BASO%: 1.9 % (ref 0.0–2.0)
Basophils Absolute: 0.1 10*3/uL (ref 0.0–0.1)
EOS%: 1.2 % (ref 0.0–7.0)
Eosinophils Absolute: 0 10*3/uL (ref 0.0–0.5)
HCT: 29.3 % — ABNORMAL LOW (ref 34.8–46.6)
HGB: 10.1 g/dL — ABNORMAL LOW (ref 11.6–15.9)
LYMPH%: 22.2 % (ref 14.0–49.7)
MCH: 31.9 pg (ref 25.1–34.0)
MCHC: 34.6 g/dL (ref 31.5–36.0)
MCV: 92.4 fL (ref 79.5–101.0)
MONO#: 0.3 10*3/uL (ref 0.1–0.9)
MONO%: 8.2 % (ref 0.0–14.0)
NEUT#: 2 10*3/uL (ref 1.5–6.5)
NEUT%: 66.5 % (ref 38.4–76.8)
Platelets: 177 10*3/uL (ref 145–400)
RBC: 3.17 10*6/uL — ABNORMAL LOW (ref 3.70–5.45)
RDW: 19.5 % — ABNORMAL HIGH (ref 11.2–14.5)
WBC: 3.1 10*3/uL — ABNORMAL LOW (ref 3.9–10.3)
lymph#: 0.7 10*3/uL — ABNORMAL LOW (ref 0.9–3.3)

## 2016-04-30 LAB — COMPREHENSIVE METABOLIC PANEL
ALT: 61 U/L — ABNORMAL HIGH (ref 0–55)
AST: 36 U/L — ABNORMAL HIGH (ref 5–34)
Albumin: 3.9 g/dL (ref 3.5–5.0)
Alkaline Phosphatase: 86 U/L (ref 40–150)
Anion Gap: 8 mEq/L (ref 3–11)
BUN: 9.7 mg/dL (ref 7.0–26.0)
CO2: 23 mEq/L (ref 22–29)
Calcium: 8.9 mg/dL (ref 8.4–10.4)
Chloride: 109 mEq/L (ref 98–109)
Creatinine: 0.6 mg/dL (ref 0.6–1.1)
EGFR: >90 mL/min/{1.73_m2} (ref >90)
Glucose: 103 mg/dl (ref 70–140)
Potassium: 4.2 mEq/L (ref 3.5–5.1)
Sodium: 140 mEq/L (ref 136–145)
Total Bilirubin: 0.62 mg/dL (ref 0.20–1.20)
Total Protein: 6.2 g/dL — ABNORMAL LOW (ref 6.4–8.3)

## 2016-04-30 MED ORDER — SODIUM CHLORIDE 0.9% FLUSH
10.0000 mL | INTRAVENOUS | Status: DC | PRN
  Administered 2016-04-30: 10 mL
  Filled 2016-04-30: qty 10

## 2016-04-30 MED ORDER — PACLITAXEL CHEMO INJECTION 300 MG/50ML
80.0000 mg/m2 | Freq: Once | INTRAVENOUS | Status: AC
  Administered 2016-04-30: 162 mg via INTRAVENOUS
  Filled 2016-04-30: qty 27

## 2016-04-30 MED ORDER — FAMOTIDINE IN NACL 20-0.9 MG/50ML-% IV SOLN
INTRAVENOUS | Status: AC
  Filled 2016-04-30: qty 50

## 2016-04-30 MED ORDER — SODIUM CHLORIDE 0.9 % IV SOLN
286.4000 mg | Freq: Once | INTRAVENOUS | Status: AC
  Administered 2016-04-30: 290 mg via INTRAVENOUS
  Filled 2016-04-30: qty 29

## 2016-04-30 MED ORDER — SODIUM CHLORIDE 0.9 % IV SOLN
Freq: Once | INTRAVENOUS | Status: AC
  Administered 2016-04-30: 11:00:00 via INTRAVENOUS

## 2016-04-30 MED ORDER — HEPARIN SOD (PORK) LOCK FLUSH 100 UNIT/ML IV SOLN
500.0000 [IU] | Freq: Once | INTRAVENOUS | Status: AC | PRN
Start: 2016-04-30 — End: 2016-04-30
  Administered 2016-04-30: 500 [IU]
  Filled 2016-04-30: qty 5

## 2016-04-30 MED ORDER — FAMOTIDINE IN NACL 20-0.9 MG/50ML-% IV SOLN
20.0000 mg | Freq: Once | INTRAVENOUS | Status: AC
  Administered 2016-04-30: 20 mg via INTRAVENOUS

## 2016-04-30 MED ORDER — SODIUM CHLORIDE 0.9% FLUSH
10.0000 mL | INTRAVENOUS | Status: DC | PRN
  Administered 2016-04-30: 10 mL via INTRAVENOUS
  Filled 2016-04-30: qty 10

## 2016-04-30 MED ORDER — DEXAMETHASONE SODIUM PHOSPHATE 10 MG/ML IJ SOLN
10.0000 mg | Freq: Once | INTRAMUSCULAR | Status: AC
  Administered 2016-04-30: 10 mg via INTRAVENOUS

## 2016-04-30 MED ORDER — DIPHENHYDRAMINE HCL 50 MG/ML IJ SOLN
INTRAMUSCULAR | Status: AC
  Filled 2016-04-30: qty 1

## 2016-04-30 MED ORDER — DEXAMETHASONE SODIUM PHOSPHATE 10 MG/ML IJ SOLN
INTRAMUSCULAR | Status: AC
  Filled 2016-04-30: qty 1

## 2016-04-30 MED ORDER — DIPHENHYDRAMINE HCL 50 MG/ML IJ SOLN
25.0000 mg | Freq: Once | INTRAMUSCULAR | Status: AC
  Administered 2016-04-30: 25 mg via INTRAVENOUS

## 2016-04-30 NOTE — Patient Instructions (Signed)
[facility] Discharge Instructions for Patients Receiving Chemotherapy  Today you received the following chemotherapy agents Taxol and Carboplatin. To help prevent nausea and vomiting after your treatment, we encourage you to take your nausea medication as directed.  If you develop nausea and vomiting that is not controlled by your nausea medication, call the clinic.   BELOW ARE SYMPTOMS THAT SHOULD BE REPORTED IMMEDIATELY:  *FEVER GREATER THAN 100.5 F  *CHILLS WITH OR WITHOUT FEVER  NAUSEA AND VOMITING THAT IS NOT CONTROLLED WITH YOUR NAUSEA MEDICATION  *UNUSUAL SHORTNESS OF BREATH  *UNUSUAL BRUISING OR BLEEDING  TENDERNESS IN MOUTH AND THROAT WITH OR WITHOUT PRESENCE OF ULCERS  *URINARY PROBLEMS  *BOWEL PROBLEMS  UNUSUAL RASH Items with * indicate a potential emergency and should be followed up as soon as possible.  Feel free to call the clinic you have any questions or concerns. The clinic phone number is [phone].  Please show the CHEMO ALERT CARD at check-in to the Emergency Department and triage nurse.    

## 2016-05-06 ENCOUNTER — Telehealth: Payer: Self-pay | Admitting: Hematology

## 2016-05-06 NOTE — Telephone Encounter (Signed)
Confirmed appointments with patient.

## 2016-05-06 NOTE — Progress Notes (Signed)
[place]  [phone] [phone]  Clinic Follow up Note   Patient Care Team: Maisie Fus, MD as PCP - General (Obstetrics and Gynecology) Collene Gobble, MD as Attending Physician (Psychiatry) Rolm Bookbinder, MD as Consulting Physician (General Surgery) Truitt Merle, MD as Consulting Physician (Hematology) Kyung Rudd, MD as Consulting Physician (Radiation Oncology) 05/07/2016   CHIEF COMPLAINTS:  Follow up left breast triple negative cancer  Oncology History   Malignant neoplasm of upper-outer quadrant of left female breast [facility])   Staging form: Breast, AJCC 7th Edition   - Clinical stage from 01/29/2016: Stage IIB (T2, N1, M0) - Signed by Truitt Merle, MD on 02/06/2016      Malignant neoplasm of upper-outer quadrant of left female breast ([place])   01/28/2016 Mammogram    Diagnostic MM and US showed a 2.9cm (3.2X2.2X2.5cm on [place]) mass in [place], multiple enlarged left axillary nodes, largest 2.5cm.       01/29/2016 Initial Diagnosis    Malignant neoplasm of upper-outer quadrant of left female breast ([place])      01/29/2016 Initial Biopsy    Left breast mass and axillary node biopsy showed IDC, G3,       01/29/2016 Receptors her2    ER-, PR-, HER2-, Ki67 85%      02/11/2016 Imaging    Bilateral breast MRI with and without contrast showed a 3.3 x 2.6 x 2.6 cm mass in the upper outer left breast, and left axillary lymphadenopathy, at least 3 abnormal lymph nodes, no other additional sites of concern.      02/12/2016 Imaging    CT chest, abdomen and pelvis with contrast showed a 2.4 x 2.7 cm lesion in the upper outer left breast, left axillary node metastasis measuring up to 1.3 cm, no evidence of distant metastasis.      02/12/2016 Imaging    Bone scan was negative for skeletal metastasis.      02/13/2016 -  Chemotherapy    Dose dense Adriamycin 60 mg/m, Cytoxan 600 mg/m, every 2 weeks, for 4 cycles, followed by weekly carboplatin and Taxol  for 12 weeks       03/27/2016 Genetic Testing    Patient has genetic testing done for personal history of breast cancer, family history of cancer. ATM c.2606C>T VUS identified on the Breast/GYN panel.  Negative genetic testing for the MSH2 inversion analysis (Boland inversion). The Breast/GYN gene panel offered by [facility] includes sequencing and rearrangement analysis for the following 23 genes:  ATM, BARD1, BRCA1, BRCA2, BRIP1, CDH1, CHEK2, EPCAM, FANCC, MLH1, MSH2, MSH6, MUTYH, NBN, NF1, PALB2, PMS2, POLD1, PTEN, RAD51C, RAD51D, RECQL, and TP53.         HISTORY OF PRESENTING ILLNESS (02/06/16) :  Grace Moyer 56 y.o. female is here because of her recently diagnosed left breast cancer. She was accompanied By her husband to our multidisciplinary breast clinic today.  This was discovered by screening mammogram, she had no palpable breast mass or axilla note. She denies any other new symptoms. She underwent a diagnostic mammogram and ultrasound on 01/28/2016, which showed a 2.9 cm mass in the upper outer quadrant, and multiple enlarged left axillary nodes, largest 2.5 cm. She underwent core needle biopsy of the left breast mass and left axillary node, both showed invasive ductal carcinoma, grade 3, triple negative, Ki-67 85%.  She developed bilateral numbness and tingling of her legs and hands at the end of May 2017. She underwent a multiple scans, and lab test, and was finally  seen by neurologist Dr. Moshe Cipro, and was diagnosed with neuromyelitis optica spectrum disorder (NOSD). She initially received high-dose steroids, and then started Rituxan infusion, status post 2 infusions, now is on every four-month schedule, next due in Jan 2018. She states her neuropathy has been stable, she denies any significant vision problem, or other neurological symptoms. Her hand function and gait are normal. She is an Clinical biochemist.   GYN HISTORY  Menarchal: 13 LMP: 65 (hysterectomy) Contraceptive: 3 years  HRT: n/a    G1P1: 66 yo daughter   CURRENT THERAPY: Dose dense Adriamycin 60 mg/m, Cytoxan 600 mg/m, every 2 weeks, for 4 cycles, followed by weekly carboplatin and Taxol for 12 weeks started 02/13/2016  INTERIM HISTORY Grace Moyer returns for follow-up and [place] and taxol with her daughter. No problems with chemotherapy reported, however patient reports that she's afraid chemotherapy is not working correctly. Patient reports numbness is unchanged.Tenderness in breast and axilla on left side reported. Chemotherapy with be completed in 4 months and expressed she would like to continue treatment with Retuxan here with Dr. Burr Medico. Fatigue is getting worse however patient is still working.   MEDICAL HISTORY:  Past Medical History:  Diagnosis Date  . Anxiety   . Family history of breast cancer   . Family history of colon cancer   . Malignant neoplasm of upper-outer quadrant of left female breast ([place]) 01/31/2016  . Neuromyelitis optica ([place])     SURGICAL HISTORY: Past Surgical History:  Procedure Laterality Date  . ABDOMINAL HYSTERECTOMY     partial  . EYE SURGERY    . PORTACATH PLACEMENT Right 02/11/2016   Procedure: INSERTION PORT-A-CATH WITH [place];  Surgeon: Rolm Bookbinder, MD;  Location: [facility];  Service: General;  Laterality: Right;    SOCIAL HISTORY: Social History   Social History  . Marital status: Married    Spouse name: N/A  . Number of children: N/A  . Years of education: N/A   Occupational History  . Not on file.   Social History Main Topics  . Smoking status: Former Smoker    Types: Cigarettes    Quit date: 09/03/2001  . Smokeless tobacco: Never Used  . Alcohol use No  . Drug use: No  . Sexual activity: Not on file   Other Topics Concern  . Not on file   Social History Narrative  . No narrative on file   Two trips reported in May with family.   FAMILY HISTORY: Family History  Problem Relation Age of Onset  . Breast cancer Maternal Grandmother      dx in her 72s  . CAD Paternal Grandmother   . Colon cancer Paternal Uncle     dx in his 1s  . COPD Maternal Grandfather   . CAD Paternal Grandfather   . Breast cancer Cousin     dx in her early to mid 24s; maternal first cousin  . Lung cancer Paternal Uncle     ALLERGIES:  has No Known Allergies.  MEDICATIONS:  Current Outpatient Prescriptions  Medication Sig Dispense Refill  . Cholecalciferol (VITAMIN D-1000 MAX ST) 1000 units tablet Take 2 tablets by mouth daily.    Marland Kitchen HYDROcodone-acetaminophen (NORCO) 10-325 MG tablet Take 1 tablet by mouth every 6 (six) hours as needed. 15 tablet 0  . lidocaine-prilocaine (EMLA) cream Apply to affected area once 30 g 3  . omeprazole (PRILOSEC) 40 MG capsule Take 40 mg by mouth daily.    . ondansetron (ZOFRAN) 8 MG tablet Take 1  tablet (8 mg total) by mouth 2 (two) times daily as needed. Start on the third day after chemotherapy. 30 tablet 1  . prochlorperazine (COMPAZINE) 10 MG tablet Take 1 tablet (10 mg total) by mouth every 6 (six) hours as needed (Nausea or vomiting). 30 tablet 1  . riTUXimab (RITUXAN) 100 MG/10ML injection Inject into the vein every 4 (four) months.     No current facility-administered medications for this visit.    Facility-Administered Medications Ordered in Other Visits  Medication Dose Route Frequency Provider Last Rate Last Dose  . sodium chloride flush (NS) 0.9 % injection 10 mL  10 mL Intracatheter PRN Truitt Merle, MD   10 mL at 05/07/16 1501    REVIEW OF SYSTEMS:   Constitutional: Denies fevers, chills or abnormal night sweats (+) fatigue Eyes: Denies blurriness of vision, double vision or watery eyes Ears, nose, mouth, throat, and face: Denies mucositis or sore throat Respiratory: Denies cough, dyspnea or wheezes Cardiovascular: Denies palpitation, chest discomfort or lower extremity swelling Gastrointestinal:  Denies nausea, heartburn (+) diarrhea with treatment Skin: Denies abnormal skin rashes Lymphatics:  Denies new lymphadenopathy or easy bruising Neurological:Denies new weaknesses (+) tingling/numbness, neuropathy stable Behavioral/Psych: (+) anxiety about cancer recurrance Musculoskeletal: (+) occasional soreness in left axilla and breast All other systems were reviewed with the patient and are negative.  PHYSICAL EXAMINATION: ECOG PERFORMANCE STATUS: 1 - Symptomatic but completely ambulatory  Vitals:   05/07/16 1110  BP: 130/71  Pulse: 79  Resp: 18  Temp: 98.6 F (37 C)   Filed Weights   05/07/16 1110  Weight: 192 lb 14.4 oz (87.5 kg)    GENERAL:alert, no distress and comfortable.  SKIN: skin color, texture, turgor are normal, no rashes or significant lesions EYES: normal, conjunctiva are pink and non-injected, sclera clear OROPHARYNX:no exudate, no erythema and lips, buccal mucosa, and tongue normal  NECK: supple, thyroid normal size, non-tender, without nodularity. (+) Incision in the right lower neck for port. Port appears clean, neck not significantly swollen, no erythema, no sign of infection. LYMPH:  no palpable lymphadenopathy in the cervical, axillary or inguinal LUNGS: clear to auscultation and percussion with normal breathing effort HEART: regular rate & rhythm and no murmurs and no lower extremity edema ABDOMEN:abdomen soft, non-tender and normal bowel sounds MUSCULOSKELETALl:no cyanosis of digits and no clubbing  PSYCH: alert & oriented x 3 with fluent speech NEURO: no focal motor/sensory deficits Breasts: Breast inspection showed them to be symmetrical with no nipple discharge. Palpation of the breasts and axilla revealed no obvious mass that I could appreciate, there is fullness at the upper-out quadrant of left breast.   LABORATORY DATA:  I have reviewed the data as listed CBC Latest Ref Rng & Units 05/07/2016 04/30/2016 04/23/2016  WBC 3.9 - 10.3 10e3/uL 2.9(L) 3.1(L) 3.4(L)  Hemoglobin 11.6 - 15.9 g/dL 10.3(L) 10.1(L) 11.0(L)  Hematocrit 34.8 - 46.6 % 29.6(L)  29.3(L) 32.0(L)  Platelets 145 - 400 10e3/uL 131(L) 177 246   CMP Latest Ref Rng & Units 05/07/2016 04/30/2016 04/23/2016  Glucose 70 - 140 mg/dl 93 103 87  BUN 7.0 - 26.0 mg/dL 8.8 9.7 9.7  Creatinine 0.6 - 1.1 mg/dL 0.6 0.6 0.7  Sodium 136 - 145 mEq/L 139 140 141  Potassium 3.5 - 5.1 mEq/L 4.1 4.2 4.0  Chloride 101 - 111 mmol/L - - -  CO2 22 - 29 mEq/L 23 23 23   Calcium 8.4 - 10.4 mg/dL 9.2 8.9 9.7  Total Protein 6.4 - 8.3 g/dL 6.4 6.2(L) 6.7  Total Bilirubin 0.20 - 1.20 mg/dL 0.94 0.62 0.80  Alkaline Phos 40 - 150 U/L 105 86 82  AST 5 - 34 U/L 47(H) 36(H) 26  ALT 0 - 55 U/L 80(H) 61(H) 49   PATHOLOGY REPORT  Diagnosis 01/29/2016 1. Breast, left, needle core biopsy, 1:30 o'clock - INVASIVE DUCTAL CARCINOMA, GRADE 3. - LYMPHOVASCULAR INVOLVEMENT BY TUMOR. 2. Lymph node, needle/core biopsy, left axillary - METASTATIC CARCINOMA.  RADIOGRAPHIC STUDIES: I have personally reviewed the radiological images as listed and agreed with the findings in the report. No results found.  Bone scan 02/12/2016 IMPRESSION: No evidence of skeletal metastatic disease.  CT chest abdomen pelvis w contrast 02/12/2016 IMPRESSION: 2.4 x 2.7 cm lesion in the upper outer left breast, corresponding to the patient's known breast neoplasm. Left axillary nodal metastasis measuring up to 13 mm short axis. No evidence of distant metastasis.  ASSESSMENT & PLAN: 56 y.o. Caucasian woman with past medical history of depression, presented with a screening mammogram discovered left breast cancer   1. Malignant neoplasm of upper-outer quadrant of left breast, invasive ductal carcinoma, G3, cT2N1M0, stage IIB, ER-/PR-/HER2- -I previously reviewed her imaging findings and the biopsy results in great detail with patient and her husband. -I previously discussed her breast MRI, CT scan and bone scan findings, which all confirmed the left breast lesion and axillary adenopathy, no other distant metastasis. -she was  previously seen by Dr. Donne Hazel and surgical options were discussed  -We previously discussed that triple negative breast cancers are much more aggressive, and her risk of cancer recurrence after breast surgery is pretty high, giving the multiple positive lymph nodes. I recommend her to consider neoadjuvant chemotherapy, to reduce her risk of recurrence, and downstage her breast cancer, to make lumpectomy and sentinel lymph node biopsy as a more feasible surgery. -I previously recommend her to have dose dense Adriamycin, Cytoxan every 2 weeks, with Neulasta support, for 4 cycles, followed by weekly carboplatin and Taxol for 12 weeks  -the goal of therapy is curative  -Due to her underlying NOSD, and existing peripheral neuropathy, my main concern of long-term side effects from chemotherapy is worsening neuropathy, especially from Taxol, we will monitor closely, and may need to hold it if her neuropathy gets worse.  -Her baseline echocardiogram was normal. -Patient is tolerating chemotherapy well, left breast mass is not palpable any more, she is clinically responding to chemotherapy, we'll continue chemotherapy. -Lab reviewed, mild anemia and low platlets at 131K today, overall stable, adequate for treatment, we'll proceed 5th week carbo and taxol. - I discussed patient could take a break from Carbo if pt has more fatigue or cytopenia, she is tolerating well so far - Repeat breast MRI 2 weeks following completion of chemotherapy.  surgery will be scheduled for May/June with Dr.Wakefield.  2. Genetics -Given her young age and triple negative disease, we recommend her to have genetic testing to rule out inheritable breast cancer syndrome, and the test result may impact her surgery. -ATM c.2606C>T VUS identified on the Breast/Gyn panel.  Negative genetic testing for the MSH2 inversion analysis ([place]and inversion). The Breast/GYN gene panel offered by [facility] includes sequencing and rearrangement analysis  for the following 23 genes:  ATM, BARD1, BRCA1, BRCA2, BRIP1, CDH1, CHEK2, EPCAM, FANCC, MLH1, MSH2, MSH6, MUTYH, NBN, NF1, PALB2, PMS2, POLD1, PTEN, RAD51C, RAD51D, RECQL, and TP53.   The report date is March 27, 2016  3. neuromyelitis optica spectrum disorder (NOSD) -she will continue follow up with her neurologist Dr. Moshe Cipro at  [facility]  -she is on rituximab every 4 weeks. It will be OK to continue her Rituximab when she is on chemotherapy, but the combination therapy will further compromise her immune system. -I have spoken with Dr. Moshe Cipro about her breast cancer treatment, he is agreeable. -She previously received Rituxan in our cancer center on 03/21/2016, tolerated generally well. She prefers to get next dose Rituxan here with [place] in May, I will arrange   4. Anemia secondary to chemo -overall mild and stable -We'll monitor closely  Plan -I will see her back in 2 weeks. -lab reviewed and proceed with cycle 5 carbo and taxol today, and continue weekly. - Echocardiogram scheduled for March 22  No orders of the defined types were placed in this encounter.   All questions were answered. The patient knows to call the clinic with any problems, questions or concerns.  I spent 20 minutes counseling the patient face to face. The total time spent in the appointment was 25 minutes and more than 50% was on counseling.  This document serves as a record of services personally performed by Truitt Merle, MD. It was created on her behalf by Maryla Morrow, a trained medical scribe. The creation of this record is based on the scribe's personal observations and the provider's statements to them. This document has been checked and approved by the attending provider.     Truitt Merle, MD 05/07/2016

## 2016-05-07 ENCOUNTER — Encounter: Payer: Self-pay | Admitting: Hematology

## 2016-05-07 ENCOUNTER — Ambulatory Visit (HOSPITAL_BASED_OUTPATIENT_CLINIC_OR_DEPARTMENT_OTHER): Payer: 59 | Admitting: Hematology

## 2016-05-07 ENCOUNTER — Ambulatory Visit: Payer: 59

## 2016-05-07 ENCOUNTER — Other Ambulatory Visit (HOSPITAL_BASED_OUTPATIENT_CLINIC_OR_DEPARTMENT_OTHER): Payer: 59

## 2016-05-07 ENCOUNTER — Ambulatory Visit (HOSPITAL_BASED_OUTPATIENT_CLINIC_OR_DEPARTMENT_OTHER): Payer: 59

## 2016-05-07 VITALS — BP 130/71 | HR 79 | Temp 98.6°F | Resp 18 | Ht 63.0 in | Wt 192.9 lb

## 2016-05-07 DIAGNOSIS — C50412 Malignant neoplasm of upper-outer quadrant of left female breast: Secondary | ICD-10-CM | POA: Diagnosis not present

## 2016-05-07 DIAGNOSIS — Z171 Estrogen receptor negative status [ER-]: Secondary | ICD-10-CM

## 2016-05-07 DIAGNOSIS — G360 Neuromyelitis optica [Devic]: Secondary | ICD-10-CM | POA: Diagnosis not present

## 2016-05-07 DIAGNOSIS — D6481 Anemia due to antineoplastic chemotherapy: Secondary | ICD-10-CM

## 2016-05-07 DIAGNOSIS — Z5111 Encounter for antineoplastic chemotherapy: Secondary | ICD-10-CM | POA: Diagnosis not present

## 2016-05-07 DIAGNOSIS — Z95828 Presence of other vascular implants and grafts: Secondary | ICD-10-CM

## 2016-05-07 LAB — CBC WITH DIFFERENTIAL/PLATELET
BASO%: 1.4 % (ref 0.0–2.0)
Basophils Absolute: 0 10*3/uL (ref 0.0–0.1)
EOS%: 1 % (ref 0.0–7.0)
Eosinophils Absolute: 0 10*3/uL (ref 0.0–0.5)
HCT: 29.6 % — ABNORMAL LOW (ref 34.8–46.6)
HGB: 10.3 g/dL — ABNORMAL LOW (ref 11.6–15.9)
LYMPH%: 23.8 % (ref 14.0–49.7)
MCH: 32.4 pg (ref 25.1–34.0)
MCHC: 34.7 g/dL (ref 31.5–36.0)
MCV: 93.6 fL (ref 79.5–101.0)
MONO#: 0.2 10*3/uL (ref 0.1–0.9)
MONO%: 6.5 % (ref 0.0–14.0)
NEUT#: 2 10*3/uL (ref 1.5–6.5)
NEUT%: 67.3 % (ref 38.4–76.8)
Platelets: 131 10*3/uL — ABNORMAL LOW (ref 145–400)
RBC: 3.16 10*6/uL — ABNORMAL LOW (ref 3.70–5.45)
RDW: 18.6 % — ABNORMAL HIGH (ref 11.2–14.5)
WBC: 2.9 10*3/uL — ABNORMAL LOW (ref 3.9–10.3)
lymph#: 0.7 10*3/uL — ABNORMAL LOW (ref 0.9–3.3)

## 2016-05-07 LAB — COMPREHENSIVE METABOLIC PANEL
ALT: 80 U/L — ABNORMAL HIGH (ref 0–55)
AST: 47 U/L — ABNORMAL HIGH (ref 5–34)
Albumin: 4 g/dL (ref 3.5–5.0)
Alkaline Phosphatase: 105 U/L (ref 40–150)
Anion Gap: 7 mEq/L (ref 3–11)
BUN: 8.8 mg/dL (ref 7.0–26.0)
CO2: 23 mEq/L (ref 22–29)
Calcium: 9.2 mg/dL (ref 8.4–10.4)
Chloride: 110 mEq/L — ABNORMAL HIGH (ref 98–109)
Creatinine: 0.6 mg/dL (ref 0.6–1.1)
EGFR: >90 mL/min/{1.73_m2} (ref >90)
Glucose: 93 mg/dl (ref 70–140)
Potassium: 4.1 mEq/L (ref 3.5–5.1)
Sodium: 139 mEq/L (ref 136–145)
Total Bilirubin: 0.94 mg/dL (ref 0.20–1.20)
Total Protein: 6.4 g/dL (ref 6.4–8.3)

## 2016-05-07 MED ORDER — DEXAMETHASONE SODIUM PHOSPHATE 10 MG/ML IJ SOLN
INTRAMUSCULAR | Status: AC
  Filled 2016-05-07: qty 1

## 2016-05-07 MED ORDER — SODIUM CHLORIDE 0.9 % IV SOLN
Freq: Once | INTRAVENOUS | Status: AC
  Administered 2016-05-07: 12:00:00 via INTRAVENOUS

## 2016-05-07 MED ORDER — FAMOTIDINE IN NACL 20-0.9 MG/50ML-% IV SOLN
20.0000 mg | Freq: Once | INTRAVENOUS | Status: AC
  Administered 2016-05-07: 20 mg via INTRAVENOUS

## 2016-05-07 MED ORDER — SODIUM CHLORIDE 0.9% FLUSH
10.0000 mL | INTRAVENOUS | Status: DC | PRN
  Administered 2016-05-07: 10 mL
  Filled 2016-05-07: qty 10

## 2016-05-07 MED ORDER — FAMOTIDINE IN NACL 20-0.9 MG/50ML-% IV SOLN
INTRAVENOUS | Status: AC
  Filled 2016-05-07: qty 50

## 2016-05-07 MED ORDER — DEXAMETHASONE SODIUM PHOSPHATE 10 MG/ML IJ SOLN
10.0000 mg | Freq: Once | INTRAMUSCULAR | Status: AC
  Administered 2016-05-07: 10 mg via INTRAVENOUS

## 2016-05-07 MED ORDER — DIPHENHYDRAMINE HCL 50 MG/ML IJ SOLN
INTRAMUSCULAR | Status: AC
  Filled 2016-05-07: qty 1

## 2016-05-07 MED ORDER — DIPHENHYDRAMINE HCL 50 MG/ML IJ SOLN
25.0000 mg | Freq: Once | INTRAMUSCULAR | Status: AC
  Administered 2016-05-07: 25 mg via INTRAVENOUS

## 2016-05-07 MED ORDER — SODIUM CHLORIDE 0.9 % IV SOLN
286.4000 mg | Freq: Once | INTRAVENOUS | Status: AC
  Administered 2016-05-07: 290 mg via INTRAVENOUS
  Filled 2016-05-07: qty 29

## 2016-05-07 MED ORDER — PACLITAXEL CHEMO INJECTION 300 MG/50ML
80.0000 mg/m2 | Freq: Once | INTRAVENOUS | Status: AC
  Administered 2016-05-07: 162 mg via INTRAVENOUS
  Filled 2016-05-07: qty 27

## 2016-05-07 MED ORDER — SODIUM CHLORIDE 0.9% FLUSH
10.0000 mL | INTRAVENOUS | Status: DC | PRN
  Administered 2016-05-07: 10 mL via INTRAVENOUS
  Filled 2016-05-07: qty 10

## 2016-05-07 MED ORDER — HEPARIN SOD (PORK) LOCK FLUSH 100 UNIT/ML IV SOLN
500.0000 [IU] | Freq: Once | INTRAVENOUS | Status: AC | PRN
  Administered 2016-05-07: 500 [IU]
  Filled 2016-05-07: qty 5

## 2016-05-07 NOTE — Patient Instructions (Signed)
[facility] Discharge Instructions for Patients Receiving Chemotherapy  Today you received the following chemotherapy agents Taxol and Carboplatin. To help prevent nausea and vomiting after your treatment, we encourage you to take your nausea medication as directed.  If you develop nausea and vomiting that is not controlled by your nausea medication, call the clinic.   BELOW ARE SYMPTOMS THAT SHOULD BE REPORTED IMMEDIATELY:  *FEVER GREATER THAN 100.5 F  *CHILLS WITH OR WITHOUT FEVER  NAUSEA AND VOMITING THAT IS NOT CONTROLLED WITH YOUR NAUSEA MEDICATION  *UNUSUAL SHORTNESS OF BREATH  *UNUSUAL BRUISING OR BLEEDING  TENDERNESS IN MOUTH AND THROAT WITH OR WITHOUT PRESENCE OF ULCERS  *URINARY PROBLEMS  *BOWEL PROBLEMS  UNUSUAL RASH Items with * indicate a potential emergency and should be followed up as soon as possible.  Feel free to call the clinic you have any questions or concerns. The clinic phone number is [phone].  Please show the CHEMO ALERT CARD at check-in to the Emergency Department and triage nurse.    

## 2016-05-14 ENCOUNTER — Ambulatory Visit (HOSPITAL_BASED_OUTPATIENT_CLINIC_OR_DEPARTMENT_OTHER): Payer: 59

## 2016-05-14 ENCOUNTER — Ambulatory Visit: Payer: 59

## 2016-05-14 ENCOUNTER — Other Ambulatory Visit (HOSPITAL_BASED_OUTPATIENT_CLINIC_OR_DEPARTMENT_OTHER): Payer: 59

## 2016-05-14 VITALS — BP 123/61 | HR 84 | Temp 98.1°F | Resp 16

## 2016-05-14 DIAGNOSIS — Z5111 Encounter for antineoplastic chemotherapy: Secondary | ICD-10-CM

## 2016-05-14 DIAGNOSIS — Z171 Estrogen receptor negative status [ER-]: Secondary | ICD-10-CM

## 2016-05-14 DIAGNOSIS — Z95828 Presence of other vascular implants and grafts: Secondary | ICD-10-CM

## 2016-05-14 DIAGNOSIS — C50412 Malignant neoplasm of upper-outer quadrant of left female breast: Secondary | ICD-10-CM | POA: Diagnosis not present

## 2016-05-14 LAB — CBC WITH DIFFERENTIAL/PLATELET
BASO%: 1.1 % (ref 0.0–2.0)
Basophils Absolute: 0 10*3/uL (ref 0.0–0.1)
EOS%: 0.8 % (ref 0.0–7.0)
Eosinophils Absolute: 0 10*3/uL (ref 0.0–0.5)
HCT: 29.9 % — ABNORMAL LOW (ref 34.8–46.6)
HGB: 10.4 g/dL — ABNORMAL LOW (ref 11.6–15.9)
LYMPH%: 25.3 % (ref 14.0–49.7)
MCH: 32.9 pg (ref 25.1–34.0)
MCHC: 34.7 g/dL (ref 31.5–36.0)
MCV: 94.9 fL (ref 79.5–101.0)
MONO#: 0.1 10*3/uL (ref 0.1–0.9)
MONO%: 5.8 % (ref 0.0–14.0)
NEUT#: 1.7 10*3/uL (ref 1.5–6.5)
NEUT%: 67 % (ref 38.4–76.8)
Platelets: 140 10*3/uL — ABNORMAL LOW (ref 145–400)
RBC: 3.15 10*6/uL — ABNORMAL LOW (ref 3.70–5.45)
RDW: 18.7 % — ABNORMAL HIGH (ref 11.2–14.5)
WBC: 2.6 10*3/uL — ABNORMAL LOW (ref 3.9–10.3)
lymph#: 0.6 10*3/uL — ABNORMAL LOW (ref 0.9–3.3)

## 2016-05-14 LAB — COMPREHENSIVE METABOLIC PANEL
ALT: 74 U/L — ABNORMAL HIGH (ref 0–55)
AST: 33 U/L (ref 5–34)
Albumin: 4.1 g/dL (ref 3.5–5.0)
Alkaline Phosphatase: 113 U/L (ref 40–150)
Anion Gap: 10 mEq/L (ref 3–11)
BUN: 9.4 mg/dL (ref 7.0–26.0)
CO2: 23 mEq/L (ref 22–29)
Calcium: 9.7 mg/dL (ref 8.4–10.4)
Chloride: 108 mEq/L (ref 98–109)
Creatinine: 0.7 mg/dL (ref 0.6–1.1)
EGFR: >90 mL/min/{1.73_m2} (ref >90)
Glucose: 91 mg/dl (ref 70–140)
Potassium: 4.1 mEq/L (ref 3.5–5.1)
Sodium: 141 mEq/L (ref 136–145)
Total Bilirubin: 1.02 mg/dL (ref 0.20–1.20)
Total Protein: 6.6 g/dL (ref 6.4–8.3)

## 2016-05-14 MED ORDER — DIPHENHYDRAMINE HCL 50 MG/ML IJ SOLN
25.0000 mg | Freq: Once | INTRAMUSCULAR | Status: AC
  Administered 2016-05-14: 25 mg via INTRAVENOUS

## 2016-05-14 MED ORDER — DEXAMETHASONE SODIUM PHOSPHATE 10 MG/ML IJ SOLN
10.0000 mg | Freq: Once | INTRAMUSCULAR | Status: AC
  Administered 2016-05-14: 10 mg via INTRAVENOUS

## 2016-05-14 MED ORDER — SODIUM CHLORIDE 0.9 % IV SOLN
Freq: Once | INTRAVENOUS | Status: AC
  Administered 2016-05-14: 11:00:00 via INTRAVENOUS

## 2016-05-14 MED ORDER — SODIUM CHLORIDE 0.9% FLUSH
10.0000 mL | INTRAVENOUS | Status: DC | PRN
  Administered 2016-05-14: 10 mL via INTRAVENOUS
  Filled 2016-05-14: qty 10

## 2016-05-14 MED ORDER — FAMOTIDINE IN NACL 20-0.9 MG/50ML-% IV SOLN
INTRAVENOUS | Status: AC
  Filled 2016-05-14: qty 50

## 2016-05-14 MED ORDER — DEXAMETHASONE SODIUM PHOSPHATE 10 MG/ML IJ SOLN
INTRAMUSCULAR | Status: AC
  Filled 2016-05-14: qty 1

## 2016-05-14 MED ORDER — PACLITAXEL CHEMO INJECTION 300 MG/50ML
80.0000 mg/m2 | Freq: Once | INTRAVENOUS | Status: AC
  Administered 2016-05-14: 162 mg via INTRAVENOUS
  Filled 2016-05-14: qty 27

## 2016-05-14 MED ORDER — FAMOTIDINE IN NACL 20-0.9 MG/50ML-% IV SOLN
20.0000 mg | Freq: Once | INTRAVENOUS | Status: AC
  Administered 2016-05-14: 20 mg via INTRAVENOUS

## 2016-05-14 MED ORDER — SODIUM CHLORIDE 0.9% FLUSH
10.0000 mL | INTRAVENOUS | Status: DC | PRN
  Administered 2016-05-14: 10 mL
  Filled 2016-05-14: qty 10

## 2016-05-14 MED ORDER — DIPHENHYDRAMINE HCL 50 MG/ML IJ SOLN
INTRAMUSCULAR | Status: AC
  Filled 2016-05-14: qty 1

## 2016-05-14 MED ORDER — HEPARIN SOD (PORK) LOCK FLUSH 100 UNIT/ML IV SOLN
500.0000 [IU] | Freq: Once | INTRAVENOUS | Status: AC | PRN
  Administered 2016-05-14: 500 [IU]
  Filled 2016-05-14: qty 5

## 2016-05-14 MED ORDER — SODIUM CHLORIDE 0.9 % IV SOLN
286.4000 mg | Freq: Once | INTRAVENOUS | Status: AC
  Administered 2016-05-14: 290 mg via INTRAVENOUS
  Filled 2016-05-14: qty 29

## 2016-05-14 NOTE — Patient Instructions (Signed)
[facility] Discharge Instructions for Patients Receiving Chemotherapy  Today you received the following chemotherapy agents Taxol and Carboplatin. To help prevent nausea and vomiting after your treatment, we encourage you to take your nausea medication as directed.  If you develop nausea and vomiting that is not controlled by your nausea medication, call the clinic.   BELOW ARE SYMPTOMS THAT SHOULD BE REPORTED IMMEDIATELY:  *FEVER GREATER THAN 100.5 F  *CHILLS WITH OR WITHOUT FEVER  NAUSEA AND VOMITING THAT IS NOT CONTROLLED WITH YOUR NAUSEA MEDICATION  *UNUSUAL SHORTNESS OF BREATH  *UNUSUAL BRUISING OR BLEEDING  TENDERNESS IN MOUTH AND THROAT WITH OR WITHOUT PRESENCE OF ULCERS  *URINARY PROBLEMS  *BOWEL PROBLEMS  UNUSUAL RASH Items with * indicate a potential emergency and should be followed up as soon as possible.  Feel free to call the clinic you have any questions or concerns. The clinic phone number is [phone].  Please show the CHEMO ALERT CARD at check-in to the Emergency Department and triage nurse.    

## 2016-05-14 NOTE — Progress Notes (Signed)
[place]  [phone] [phone]  Clinic Follow up Note   Patient Care Team: Maisie Fus, MD as PCP - General (Obstetrics and Gynecology) Collene Gobble, MD as Attending Physician (Psychiatry) Rolm Bookbinder, MD as Consulting Physician (General Surgery) Truitt Merle, MD as Consulting Physician (Hematology) Kyung Rudd, MD as Consulting Physician (Radiation Oncology) 05/21/2016  CHIEF COMPLAINTS:  Follow up left breast triple negative cancer  Oncology History   Malignant neoplasm of upper-outer quadrant of left female breast [facility])   Staging form: Breast, AJCC 7th Edition   - Clinical stage from 01/29/2016: Stage IIB (T2, N1, M0) - Signed by Truitt Merle, MD on 02/06/2016      Malignant neoplasm of upper-outer quadrant of left female breast ([place])   01/28/2016 Mammogram    Diagnostic MM and US showed a 2.9cm (3.2X2.2X2.5cm on [place]) mass in [place], multiple enlarged left axillary nodes, largest 2.5cm.       01/29/2016 Initial Diagnosis    Malignant neoplasm of upper-outer quadrant of left female breast ([place])      01/29/2016 Initial Biopsy    Left breast mass and axillary node biopsy showed IDC, G3,       01/29/2016 Receptors her2    ER-, PR-, HER2-, Ki67 85%      02/11/2016 Imaging    Bilateral breast MRI with and without contrast showed a 3.3 x 2.6 x 2.6 cm mass in the upper outer left breast, and left axillary lymphadenopathy, at least 3 abnormal lymph nodes, no other additional sites of concern.      02/12/2016 Imaging    CT chest, abdomen and pelvis with contrast showed a 2.4 x 2.7 cm lesion in the upper outer left breast, left axillary node metastasis measuring up to 1.3 cm, no evidence of distant metastasis.      02/12/2016 Imaging    Bone scan was negative for skeletal metastasis.      02/13/2016 -  Neo-Adjuvant Chemotherapy    Dose dense Adriamycin 60 mg/m, Cytoxan 600 mg/m, every 2 weeks, for 4 cycles, followed by weekly  carboplatin and Taxol for 12 weeks       03/27/2016 Genetic Testing    Patient has genetic testing done for personal history of breast cancer, family history of cancer. ATM c.2606C>T VUS identified on the Breast/GYN panel.  Negative genetic testing for the MSH2 inversion analysis (Boland inversion). The Breast/GYN gene panel offered by [facility] includes sequencing and rearrangement analysis for the following 23 genes:  ATM, BARD1, BRCA1, BRCA2, BRIP1, CDH1, CHEK2, EPCAM, FANCC, MLH1, MSH2, MSH6, MUTYH, NBN, NF1, PALB2, PMS2, POLD1, PTEN, RAD51C, RAD51D, RECQL, and TP53.         HISTORY OF PRESENTING ILLNESS (02/06/16) :  Grace Moyer 56 y.o. female is here because of her recently diagnosed left breast cancer. She was accompanied By her husband to our multidisciplinary breast clinic today.  This was discovered by screening mammogram, she had no palpable breast mass or axilla note. She denies any other new symptoms. She underwent a diagnostic mammogram and ultrasound on 01/28/2016, which showed a 2.9 cm mass in the upper outer quadrant, and multiple enlarged left axillary nodes, largest 2.5 cm. She underwent core needle biopsy of the left breast mass and left axillary node, both showed invasive ductal carcinoma, grade 3, triple negative, Ki-67 85%.  She developed bilateral numbness and tingling of her legs and hands at the end of May 2017. She underwent a multiple scans, and lab test, and was finally  seen by neurologist Dr. Moshe Cipro, and was diagnosed with neuromyelitis optica spectrum disorder (NOSD). She initially received high-dose steroids, and then started Rituxan infusion, status post 2 infusions, now is on every four-month schedule, next due in Jan 2018. She states her neuropathy has been stable, she denies any significant vision problem, or other neurological symptoms. Her hand function and gait are normal. She is an Clinical biochemist.   GYN HISTORY  Menarchal: 13 LMP: 75  (hysterectomy) Contraceptive: 3 years  HRT: n/a  G1P1: 52 yo daughter   CURRENT THERAPY: Neoadjuvant dose dense Adriamycin 60 mg/m, Cytoxan 600 mg/m, every 2 weeks, for 4 cycles, followed by weekly carboplatin and Taxol for 12 weeks started 02/13/2016  INTERIM HISTORY Grace Moyer returns for follow-up and 7th cycle carbo and taxol with her husband. The patient states that she felt restless during her last treatment. She reports usually falling asleep due to the benadryl. She reports somewhat more fatigue than usual. Patient reports numbness is unchanged. She report her nails are beginning to change colors.  MEDICAL HISTORY:  Past Medical History:  Diagnosis Date  . Anxiety   . Family history of breast cancer   . Family history of colon cancer   . Malignant neoplasm of upper-outer quadrant of left female breast ([place]) 01/31/2016  . Neuromyelitis optica ([place])     SURGICAL HISTORY: Past Surgical History:  Procedure Laterality Date  . ABDOMINAL HYSTERECTOMY     partial  . EYE SURGERY    . PORTACATH PLACEMENT Right 02/11/2016   Procedure: INSERTION PORT-A-CATH WITH [place];  Surgeon: Rolm Bookbinder, MD;  Location: [place];  Service: General;  Laterality: Right;    SOCIAL HISTORY: Social History   Social History  . Marital status: Married    Spouse name: N/A  . Number of children: N/A  . Years of education: N/A   Occupational History  . Not on file.   Social History Main Topics  . Smoking status: Former Smoker    Types: Cigarettes    Quit date: 09/03/2001  . Smokeless tobacco: Never Used  . Alcohol use No  . Drug use: No  . Sexual activity: Not on file   Other Topics Concern  . Not on file   Social History Narrative  . No narrative on file   Two trips reported in May with family.   FAMILY HISTORY: Family History  Problem Relation Age of Onset  . Breast cancer Maternal Grandmother     dx in her 53s  . CAD Paternal Grandmother   . Colon cancer  Paternal Uncle     dx in his 96s  . COPD Maternal Grandfather   . CAD Paternal Grandfather   . Breast cancer Cousin     dx in her early to mid 60s; maternal first cousin  . Lung cancer Paternal Uncle     ALLERGIES:  has No Known Allergies.  MEDICATIONS:  Current Outpatient Prescriptions  Medication Sig Dispense Refill  . Cholecalciferol (VITAMIN D-1000 MAX ST) 1000 units tablet Take 2 tablets by mouth daily.    Marland Kitchen gabapentin (NEURONTIN) 100 MG capsule     . HYDROcodone-acetaminophen (NORCO) 10-325 MG tablet Take 1 tablet by mouth every 6 (six) hours as needed. 15 tablet 0  . lidocaine-prilocaine (EMLA) cream Apply to affected area once 30 g 3  . omeprazole (PRILOSEC) 40 MG capsule Take 40 mg by mouth daily.    . ondansetron (ZOFRAN) 8 MG tablet Take 1 tablet (8 mg total) by mouth 2 (  two) times daily as needed. Start on the third day after chemotherapy. 30 tablet 1  . prochlorperazine (COMPAZINE) 10 MG tablet Take 1 tablet (10 mg total) by mouth every 6 (six) hours as needed (Nausea or vomiting). 30 tablet 1  . riTUXimab (RITUXAN) 100 MG/10ML injection Inject into the vein every 4 (four) months.     No current facility-administered medications for this visit.     REVIEW OF SYSTEMS:   Constitutional: Denies fevers, chills or abnormal night sweats (+) fatigue Eyes: Denies blurriness of vision, double vision or watery eyes Ears, nose, mouth, throat, and face: Denies mucositis or sore throat Respiratory: Denies cough, dyspnea or wheezes Cardiovascular: Denies palpitation, chest discomfort or lower extremity swelling Gastrointestinal:  Denies nausea, heartburn Skin: Denies abnormal skin rashes Lymphatics: Denies new lymphadenopathy or easy bruising Neurological:Denies new weaknesses (+) tingling/numbness, neuropathy stable Behavioral/Psych: (+) anxiety about cancer recurrance All other systems were reviewed with the patient and are negative.  PHYSICAL EXAMINATION: ECOG PERFORMANCE  STATUS: 1 - Symptomatic but completely ambulatory  Vitals:   05/21/16 1119  BP: 118/68  Pulse: 81  Resp: 18  Temp: 98.6 F (37 C)   Filed Weights   05/21/16 1119  Weight: 190 lb 9.6 oz (86.5 kg)    GENERAL:alert, no distress and comfortable.  SKIN: skin color, texture, turgor are normal, no rashes or significant lesions EYES: normal, conjunctiva are pink and non-injected, sclera clear OROPHARYNX:no exudate, no erythema and lips, buccal mucosa, and tongue normal  NECK: supple, thyroid normal size, non-tender, without nodularity. (+) Incision in the right lower neck for port. Port appears clean, neck not significantly swollen, no erythema, no sign of infection. LYMPH:  no palpable lymphadenopathy in the cervical, axillary or inguinal LUNGS: clear to auscultation and percussion with normal breathing effort HEART: regular rate & rhythm and no murmurs and no lower extremity edema ABDOMEN:abdomen soft, non-tender and normal bowel sounds MUSCULOSKELETALl:no cyanosis of digits and no clubbing  PSYCH: alert & oriented x 3 with fluent speech NEURO: no focal motor/sensory deficits Breasts: Breast inspection showed them to be symmetrical with no nipple discharge. Palpation of the breasts and axilla revealed no obvious mass that I could appreciate, there is fullness at the upper-outter quadrant of left breast.   LABORATORY DATA:  I have reviewed the data as listed CBC Latest Ref Rng & Units 05/21/2016 05/14/2016 05/07/2016  WBC 3.9 - 10.3 10e3/uL 1.4(L) 2.6(L) 2.9(L)  Hemoglobin 11.6 - 15.9 g/dL 9.3(L) 10.4(L) 10.3(L)  Hematocrit 34.8 - 46.6 % 26.2(L) 29.9(L) 29.6(L)  Platelets 145 - 400 10e3/uL 197 140(L) 131(L)   CMP Latest Ref Rng & Units 05/21/2016 05/14/2016 05/07/2016  Glucose 70 - 140 mg/dl 91 91 93  BUN 7.0 - 26.0 mg/dL 10.2 9.4 8.8  Creatinine 0.6 - 1.1 mg/dL 0.6 0.7 0.6  Sodium 136 - 145 mEq/L 140 141 139  Potassium 3.5 - 5.1 mEq/L 4.0 4.1 4.1  Chloride 101 - 111 mmol/L - - -  CO2  22 - 29 mEq/L _0 Calcium 8.4 - 10.4 mg/dL 9.1 9.7 9.2  Total Protein 6.4 - 8.3 g/dL 6.2(L) 6.6 6.4  Total Bilirubin 0.20 - 1.20 mg/dL 0.86 1.02 0.94  Alkaline Phos 40 - 150 U/L 89 113 105  AST 5 - 34 U/L 26 33 47(H)  ALT 0 - 55 U/L 44 74(H) 80(H)   ANC 0.7  PATHOLOGY REPORT  Diagnosis 01/29/2016 1. Breast, left, needle core biopsy, 1:30 o'clock - INVASIVE DUCTAL CARCINOMA, GRADE 3. - LYMPHOVASCULAR  INVOLVEMENT BY TUMOR. 2. Lymph node, needle/core biopsy, left axillary - METASTATIC CARCINOMA.  RADIOGRAPHIC STUDIES: I have personally reviewed the radiological images as listed and agreed with the findings in the report. No results found.  Bone scan 02/12/2016 IMPRESSION: No evidence of skeletal metastatic disease.  CT chest abdomen pelvis w contrast 02/12/2016 IMPRESSION: 2.4 x 2.7 cm lesion in the upper outer left breast, corresponding to the patient's known breast neoplasm. Left axillary nodal metastasis measuring up to 13 mm short axis. No evidence of distant metastasis.  ASSESSMENT & PLAN: 56 y.o. Caucasian woman with past medical history of depression, presented with a screening mammogram discovered left breast cancer   1. Malignant neoplasm of upper-outer quadrant of left breast, invasive ductal carcinoma, G3, cT2N1M0, stage IIB, ER-/PR-/HER2- -I previously reviewed her imaging findings and the biopsy results in great detail with patient and her husband. -I previously discussed her breast MRI, CT scan and bone scan findings, which all confirmed the left breast lesion and axillary adenopathy, no other distant metastasis. -she was previously seen by Dr. Donne Hazel and surgical options were discussed  -We previously discussed that triple negative breast cancers are much more aggressive, and her risk of cancer recurrence after breast surgery is pretty high, giving the multiple positive lymph nodes. I recommend her to consider neoadjuvant chemotherapy, to reduce her risk of  recurrence, and downstage her breast cancer, to make lumpectomy and sentinel lymph node biopsy as a more feasible surgery. -I previously recommend her to have dose dense Adriamycin, Cytoxan every 2 weeks, with Neulasta support, for 4 cycles, followed by weekly carboplatin and Taxol for 12 weeks  -the goal of therapy is curative  -Due to her underlying NOSD, and existing peripheral neuropathy, my main concern of long-term side effects from chemotherapy is worsening neuropathy, especially from Taxol, we will monitor closely, and may need to hold it if her neuropathy gets worse.  -Her baseline echocardiogram was normal. -Patient is tolerating chemotherapy well, left breast mass is not palpable anymore, she is clinically responding to chemotherapy, we'll continue chemotherapy. -Lab reviewed. Mild anemia, normal platlets at 197K, ANC 0.7 TODAY. Due to low ANC, we'll proceed with 7th week Taxol but no Carbo today. -We discussed starting the patient on Granix due to her low ANC (0.7) next week - I discussed patient could take a break from Carbo if pt has more fatigue or cytopenia. She reports not feeling well with Neulasta, but is in agreement to start Granix on 05/28/16. - Repeat breast MRI 2 weeks following completion of chemotherapy. Surgery will be scheduled in May/June with Dr.Wakefield.  2. Genetics -Given her young age and triple negative disease, we recommend her to have genetic testing to rule out inheritable breast cancer syndrome, and the test result may impact her surgery. -ATM c.2606C>T VUS identified on the Breast/Gyn panel.  Negative genetic testing for the MSH2 inversion analysis (Boland inversion). The Breast/GYN gene panel offered by [facility] includes sequencing and rearrangement analysis for the following 23 genes:  ATM, BARD1, BRCA1, BRCA2, BRIP1, CDH1, CHEK2, EPCAM, FANCC, MLH1, MSH2, MSH6, MUTYH, NBN, NF1, PALB2, PMS2, POLD1, PTEN, RAD51C, RAD51D, RECQL, and TP53.   The report date is  March 27, 2016  3. neuromyelitis optica spectrum disorder (NOSD) -she will continue follow up with her neurologist Dr. Moshe Cipro at [facility]  -she is on rituximab every 4 weeks. It will be OK to continue her Rituximab when she is on chemotherapy, but the combination therapy will further compromise her immune system. -I have  spoken with Dr. Moshe Cipro about her breast cancer treatment, he is agreeable. -She previously received Rituxan in our cancer center on 03/21/2016, tolerated generally well. She prefers to get next dose Rituxan here with [place] in May, I will arrange.  4. Anemia secondary to chemo -overall mild and stable -We'll monitor closely  Plan -I will see her back in 2 and 4 weeks with lab. -lab reviewed and proceed with cycle 7 taxol (but no [place] today) and continue weekly. -Granix starting next week. - Echocardiogram scheduled for March 22  No orders of the defined types were placed in this encounter.   All questions were answered. The patient knows to call the clinic with any problems, questions or concerns.  I spent 20 minutes counseling the patient face to face. The total time spent in the appointment was 25 minutes and more than 50% was on counseling.     Truitt Merle, MD 05/21/2016  This document serves as a record of services personally performed by Truitt Merle, MD. It was created on her behalf by Darcus Austin, a trained medical scribe. The creation of this record is based on the scribe's personal observations and the provider's statements to them. This document has been checked and approved by the attending provider.

## 2016-05-21 ENCOUNTER — Other Ambulatory Visit (HOSPITAL_BASED_OUTPATIENT_CLINIC_OR_DEPARTMENT_OTHER): Payer: 59

## 2016-05-21 ENCOUNTER — Ambulatory Visit (HOSPITAL_BASED_OUTPATIENT_CLINIC_OR_DEPARTMENT_OTHER): Payer: 59

## 2016-05-21 ENCOUNTER — Ambulatory Visit: Payer: 59

## 2016-05-21 ENCOUNTER — Ambulatory Visit (HOSPITAL_BASED_OUTPATIENT_CLINIC_OR_DEPARTMENT_OTHER): Payer: 59 | Admitting: Hematology

## 2016-05-21 ENCOUNTER — Encounter: Payer: Self-pay | Admitting: Hematology

## 2016-05-21 VITALS — BP 118/68 | HR 81 | Temp 98.6°F | Resp 18 | Ht 63.0 in | Wt 190.6 lb

## 2016-05-21 DIAGNOSIS — Z171 Estrogen receptor negative status [ER-]: Secondary | ICD-10-CM

## 2016-05-21 DIAGNOSIS — G360 Neuromyelitis optica [Devic]: Secondary | ICD-10-CM | POA: Diagnosis not present

## 2016-05-21 DIAGNOSIS — R200 Anesthesia of skin: Secondary | ICD-10-CM

## 2016-05-21 DIAGNOSIS — D6481 Anemia due to antineoplastic chemotherapy: Secondary | ICD-10-CM

## 2016-05-21 DIAGNOSIS — Z5111 Encounter for antineoplastic chemotherapy: Secondary | ICD-10-CM | POA: Diagnosis not present

## 2016-05-21 DIAGNOSIS — C50412 Malignant neoplasm of upper-outer quadrant of left female breast: Secondary | ICD-10-CM

## 2016-05-21 LAB — CBC WITH DIFFERENTIAL/PLATELET
BASO%: 1 % (ref 0.0–2.0)
Basophils Absolute: 0 10*3/uL (ref 0.0–0.1)
EOS%: 0.7 % (ref 0.0–7.0)
Eosinophils Absolute: 0 10*3/uL (ref 0.0–0.5)
HCT: 26.2 % — ABNORMAL LOW (ref 34.8–46.6)
HGB: 9.3 g/dL — ABNORMAL LOW (ref 11.6–15.9)
LYMPH%: 39.6 % (ref 14.0–49.7)
MCH: 34.1 pg — ABNORMAL HIGH (ref 25.1–34.0)
MCHC: 35.4 g/dL (ref 31.5–36.0)
MCV: 96.4 fL (ref 79.5–101.0)
MONO#: 0.1 10*3/uL (ref 0.1–0.9)
MONO%: 7.9 % (ref 0.0–14.0)
NEUT#: 0.7 10*3/uL — ABNORMAL LOW (ref 1.5–6.5)
NEUT%: 50.8 % (ref 38.4–76.8)
Platelets: 197 10*3/uL (ref 145–400)
RBC: 2.72 10*6/uL — ABNORMAL LOW (ref 3.70–5.45)
RDW: 18.6 % — ABNORMAL HIGH (ref 11.2–14.5)
WBC: 1.4 10*3/uL — ABNORMAL LOW (ref 3.9–10.3)
lymph#: 0.6 10*3/uL — ABNORMAL LOW (ref 0.9–3.3)

## 2016-05-21 LAB — COMPREHENSIVE METABOLIC PANEL
ALT: 44 U/L (ref 0–55)
AST: 26 U/L (ref 5–34)
Albumin: 4 g/dL (ref 3.5–5.0)
Alkaline Phosphatase: 89 U/L (ref 40–150)
Anion Gap: 7 mEq/L (ref 3–11)
BUN: 10.2 mg/dL (ref 7.0–26.0)
CO2: 22 mEq/L (ref 22–29)
Calcium: 9.1 mg/dL (ref 8.4–10.4)
Chloride: 111 mEq/L — ABNORMAL HIGH (ref 98–109)
Creatinine: 0.6 mg/dL (ref 0.6–1.1)
EGFR: >90 mL/min/{1.73_m2} (ref >90)
Glucose: 91 mg/dl (ref 70–140)
Potassium: 4 mEq/L (ref 3.5–5.1)
Sodium: 140 mEq/L (ref 136–145)
Total Bilirubin: 0.86 mg/dL (ref 0.20–1.20)
Total Protein: 6.2 g/dL — ABNORMAL LOW (ref 6.4–8.3)

## 2016-05-21 MED ORDER — PACLITAXEL CHEMO INJECTION 300 MG/50ML
80.0000 mg/m2 | Freq: Once | INTRAVENOUS | Status: AC
  Administered 2016-05-21: 162 mg via INTRAVENOUS
  Filled 2016-05-21: qty 27

## 2016-05-21 MED ORDER — DEXAMETHASONE SODIUM PHOSPHATE 10 MG/ML IJ SOLN
INTRAMUSCULAR | Status: AC
  Filled 2016-05-21: qty 1

## 2016-05-21 MED ORDER — DIPHENHYDRAMINE HCL 50 MG/ML IJ SOLN
25.0000 mg | Freq: Once | INTRAMUSCULAR | Status: AC
  Administered 2016-05-21: 25 mg via INTRAVENOUS

## 2016-05-21 MED ORDER — HEPARIN SOD (PORK) LOCK FLUSH 100 UNIT/ML IV SOLN
500.0000 [IU] | Freq: Once | INTRAVENOUS | Status: AC | PRN
  Administered 2016-05-21: 500 [IU]
  Filled 2016-05-21: qty 5

## 2016-05-21 MED ORDER — FAMOTIDINE IN NACL 20-0.9 MG/50ML-% IV SOLN
20.0000 mg | Freq: Once | INTRAVENOUS | Status: AC
  Administered 2016-05-21: 20 mg via INTRAVENOUS

## 2016-05-21 MED ORDER — FAMOTIDINE IN NACL 20-0.9 MG/50ML-% IV SOLN
INTRAVENOUS | Status: AC
  Filled 2016-05-21: qty 50

## 2016-05-21 MED ORDER — SODIUM CHLORIDE 0.9 % IV SOLN
Freq: Once | INTRAVENOUS | Status: AC
  Administered 2016-05-21: 13:00:00 via INTRAVENOUS

## 2016-05-21 MED ORDER — DIPHENHYDRAMINE HCL 50 MG/ML IJ SOLN
INTRAMUSCULAR | Status: AC
  Filled 2016-05-21: qty 1

## 2016-05-21 MED ORDER — DEXAMETHASONE SODIUM PHOSPHATE 10 MG/ML IJ SOLN
10.0000 mg | Freq: Once | INTRAMUSCULAR | Status: AC
  Administered 2016-05-21: 10 mg via INTRAVENOUS

## 2016-05-21 MED ORDER — SODIUM CHLORIDE 0.9% FLUSH
10.0000 mL | INTRAVENOUS | Status: DC | PRN
  Administered 2016-05-21: 10 mL
  Filled 2016-05-21: qty 10

## 2016-05-21 NOTE — Progress Notes (Signed)
Discussed neutropenic precautions with patient and husband. Pt verbalized understanding. Masks given.

## 2016-05-21 NOTE — Patient Instructions (Addendum)
[facility] Discharge Instructions for Patients Receiving Chemotherapy  Today you received the following chemotherapy agents: Taxol   To help prevent nausea and vomiting after your treatment, we encourage you to take your nausea medication as directed.    If you develop nausea and vomiting that is not controlled by your nausea medication, call the clinic.   BELOW ARE SYMPTOMS THAT SHOULD BE REPORTED IMMEDIATELY:  *FEVER GREATER THAN 100.5 F  *CHILLS WITH OR WITHOUT FEVER  NAUSEA AND VOMITING THAT IS NOT CONTROLLED WITH YOUR NAUSEA MEDICATION  *UNUSUAL SHORTNESS OF BREATH  *UNUSUAL BRUISING OR BLEEDING  TENDERNESS IN MOUTH AND THROAT WITH OR WITHOUT PRESENCE OF ULCERS  *URINARY PROBLEMS  *BOWEL PROBLEMS  UNUSUAL RASH Items with * indicate a potential emergency and should be followed up as soon as possible.  Feel free to call the clinic you have any questions or concerns. The clinic phone number is [phone].  Please show the [facility] at check-in to the Emergency Department and triage nurse.   Neutropenia Neutropenia is a condition that occurs when you have a lower-than-normal level of a type of white blood cell (neutrophil) in your body. Neutrophils are made in the spongy center of large bones (bone marrow) and they fight infections. Neutrophils are your body's main defense against bacterial and fungal infections. The fewer neutrophils you have and the longer your body remains without them, the greater your risk of getting a severe infection. What are the causes? This condition can occur if your body uses up or destroys neutrophils faster than your bone marrow can make them. This problem may happen because of:  Bacterial or fungal infection.  Allergic disorders.  Reactions to some medicines.  Autoimmune disease.  An enlarged spleen. This condition can also occur if your bone marrow does not produce enough neutrophils. This problem may be  caused by:  Cancer.  Cancer treatments, such as radiation or chemotherapy.  Viral infections.  Medicines, such as phenytoin.  Vitamin B12 deficiency.  Diseases of the bone marrow.  Environmental toxins, such as insecticides. What are the signs or symptoms? This condition does not usually cause symptoms. If symptoms are present, they are usually caused by an underlying infection. Symptoms of an infection may include:  Fever.  Chills.  Swollen glands.  Oral or anal ulcers.  Cough and shortness of breath.  Rash.  Skin infection.  Fatigue. How is this diagnosed? Your health care provider may suspect neutropenia if you have:  A condition that may cause neutropenia.  Symptoms of infection, especially fever.  Frequent and unusual infections. You will have a medical history and physical exam. Tests will also be done, such as:  A complete blood count (CBC).  A procedure to collect a sample of bone marrow for examination (bone marrow biopsy).  A chest X-ray.  A urine culture.  A blood culture. How is this treated? Treatment depends on the underlying cause and severity of your condition. Mild neutropenia may not require treatment. Treatment may include medicines, such as:  Antibiotic medicine given through an IV tube.  Antiviral medicines.  Antifungal medicines.  A medicine to increase neutrophil production (colony-stimulating factor). You may get this drug through an IV tube or by injection.  Steroids given through an IV tube. If an underlying condition is causing neutropenia, you may need treatment for that condition. If medicines you are taking are causing neutropenia, your health care provider may have you stop taking those medicines. Follow these instructions at  home: Medicines   Take over-the-counter and prescription medicines only as told by your health care provider.  Get a seasonal flu shot (influenza vaccine). Lifestyle   Do not eat  unpasteurized foods.Do not eat unwashed raw fruits or vegetables.  Avoid exposure to groups of people or children.  Avoid being around people who are sick.  Avoid being around dirt or dust, such as in construction areas or gardens.  Do not provide direct care for pets. Avoid animal droppings. Do not clean litter boxes and bird cages. Hygiene    Bathe daily.  Clean the area between the genitals and the anus (perineal area) after you urinate or have a bowel movement. If you are female, wipe from front to back.  Brush your teeth with a soft toothbrush before and after meals.  Do not use a razor that has a blade. Use an electric razor to remove hair.  Wash your hands often. Make sure others who come in contact with you also wash their hands. If soap and water are not available, use hand sanitizer. General instructions   Do not have sex unless your health care provider has approved.  Take actions to avoid cuts and burns. For example:  Be cautious when you use knives. Always cut away from yourself.  Keep knives in protective sheaths or guards when not in use.  Use oven mitts when you cook with a hot stove, oven, or grill.  Stand a safe distance away from open fires.  Avoid people who received a vaccine in the past 30 days if that vaccine contained a live version of the germ (live vaccine). You should not get a live vaccine. Common live vaccines are varicella, measles, mumps, and rubella.  Do not share food utensils.  Do not use tampons, enemas, or rectal suppositories unless your health care provider has approved.  Keep all appointments as told by your health care provider. This is important. Contact a health care provider if:  You have a fever.  You have chills or you start to shake.  You have:  A sore throat.  A warm, red, or tender area on your skin.  A cough.  Frequent or painful urination.  Vaginal discharge or itching.  You develop:  Sores in your mouth  or anus.  Swollen lymph nodes.  Red streaks on the skin.  A rash.  You feel:  Nauseous or you vomit.  Very fatigued.  Short of breath. This information is not intended to replace advice given to you by your health care provider. Make sure you discuss any questions you have with your health care provider. Document Released: 08/09/2001 Document Revised: 07/26/2015 Document Reviewed: 08/30/2014 Elsevier Interactive Patient Education  2017 [employer].

## 2016-05-21 NOTE — Progress Notes (Signed)
OK to treat with Taxol only per Dr Burr Medico today despite low WBC/ANC.  Order repeated & verified.

## 2016-05-22 ENCOUNTER — Encounter (HOSPITAL_COMMUNITY): Payer: Self-pay | Admitting: Internal Medicine

## 2016-05-22 ENCOUNTER — Encounter: Payer: Self-pay | Admitting: Hematology

## 2016-05-22 ENCOUNTER — Ambulatory Visit (HOSPITAL_COMMUNITY)
Admission: RE | Admit: 2016-05-22 | Discharge: 2016-05-22 | Disposition: A | Discharge status: Home / Self Care | Payer: 59 | Source: Ambulatory Visit | Attending: Obstetrics & Gynecology | Admitting: Obstetrics & Gynecology

## 2016-05-22 ENCOUNTER — Ambulatory Visit (HOSPITAL_BASED_OUTPATIENT_CLINIC_OR_DEPARTMENT_OTHER)
Admission: RE | Admit: 2016-05-22 | Discharge: 2016-05-22 | Disposition: A | Discharge status: Home / Self Care | Payer: 59 | Source: Ambulatory Visit | Attending: Internal Medicine | Admitting: Internal Medicine

## 2016-05-22 ENCOUNTER — Other Ambulatory Visit (HOSPITAL_COMMUNITY): Payer: Self-pay | Admitting: Cardiology

## 2016-05-22 VITALS — BP 118/80 | HR 82 | Wt 192.5 lb

## 2016-05-22 DIAGNOSIS — Z803 Family history of malignant neoplasm of breast: Secondary | ICD-10-CM | POA: Diagnosis not present

## 2016-05-22 DIAGNOSIS — Z8249 Family history of ischemic heart disease and other diseases of the circulatory system: Secondary | ICD-10-CM | POA: Insufficient documentation

## 2016-05-22 DIAGNOSIS — I071 Rheumatic tricuspid insufficiency: Secondary | ICD-10-CM | POA: Diagnosis not present

## 2016-05-22 DIAGNOSIS — G360 Neuromyelitis optica [Devic]: Secondary | ICD-10-CM | POA: Insufficient documentation

## 2016-05-22 DIAGNOSIS — Z87891 Personal history of nicotine dependence: Secondary | ICD-10-CM | POA: Diagnosis not present

## 2016-05-22 DIAGNOSIS — F419 Anxiety disorder, unspecified: Secondary | ICD-10-CM | POA: Diagnosis not present

## 2016-05-22 DIAGNOSIS — C50919 Malignant neoplasm of unspecified site of unspecified female breast: Secondary | ICD-10-CM | POA: Diagnosis not present

## 2016-05-22 DIAGNOSIS — Z171 Estrogen receptor negative status [ER-]: Secondary | ICD-10-CM | POA: Diagnosis not present

## 2016-05-22 DIAGNOSIS — C50419 Malignant neoplasm of upper-outer quadrant of unspecified female breast: Secondary | ICD-10-CM | POA: Diagnosis not present

## 2016-05-22 NOTE — Addendum Note (Signed)
Encounter addended by: Scarlette Calico, RN on: 05/22/2016 11:06 AM<BR>    Actions taken: Sign clinical note, Diagnosis association updated, Order list changed

## 2016-05-22 NOTE — Patient Instructions (Signed)
We will contact you in 6 months to schedule your next appointment and echocardiogram  

## 2016-05-22 NOTE — Progress Notes (Addendum)
CARDI-ONCOLOGY CONSULT NOTE  Referring Physician: Burr Medico Primary Care: None OB: Dr. Dory Horn   HPI:  Grace Moyer 56 y.o. Woman with little past medical hx except for left breast cancer and neuromyelitis optica . Referred by Dr. Burr Medico for enrollment into cardio-oncology clinic.  Tumor biopsy: invasive ductal carcinoma, G3, cT2N1M0, stage IIB, ER-/PR-/HER2-. + 3-5 LNs +  Denies h/o known heart disease. Smoked a little but quit 2004. Diagnosed with breast cancer in 11/17.   Underwent Adriamycin, Cytoxan every 2 weeks, with Neulasta support, for 4 cycles completed 1/18/ Now on weekly carboplatin and Taxol for 12 weeks. Completes 06/25/16. Will then have surgery and XRT.  Doing well. Denies SOB orthopnea or PND. Energy ok. Still working as an Biomedical engineer. + neuropathy   Echo 12/17 EF 60-65% GLS 16.9% Echo today 55-60% LS' 8.8 cm/s GLS 18.6%   Review of Systems: [y] = yes, [ ]  = no   General: Weight gain [ ] ; Weight loss [ ] ; Anorexia [ ] ; Fatigue [ ] ; Fever [ ] ; Chills [ ] ; Weakness [ ]   Cardiac: Chest pain/pressure [ ] ; Resting SOB [ ] ; Exertional SOB [ ] ; Orthopnea [ ] ; Pedal Edema [ ] ; Palpitations [ ] ; Syncope [ ] ; Presyncope [ ] ; Paroxysmal nocturnal dyspnea[ ]   Pulmonary: Cough [ ] ; Wheezing[ ] ; Hemoptysis[ ] ; Sputum [ ] ; Snoring [ ]   GI: Vomiting[ ] ; Dysphagia[ ] ; Melena[ ] ; Hematochezia [ ] ; Heartburn[ ] ; Abdominal pain [ ] ; Constipation [ ] ; Diarrhea [ ] ; BRBPR [ ]   GU: Hematuria[ ] ; Dysuria [ ] ; Nocturia[ ]   Vascular: Pain in legs with walking [ ] ; Pain in feet with lying flat [ ] ; Non-healing sores [ ] ; Stroke [ ] ; TIA [ ] ; Slurred speech [ ] ;  Neuro: Headaches[ ] ; Vertigo[ ] ; Seizures[ ] ; Paresthesias[ ] ;Blurred vision [ ] ; Diplopia [ ] ; Vision changes [ ]   Ortho/Skin: Arthritis [ y]; Joint pain [ y]; Muscle pain [ ] ; Joint swelling [ ] ; Back Pain [ ] ; Rash [ ]   Psych: Depression[ ] ; Anxiety[ ]   Heme: Bleeding problems [ ] ; Clotting disorders [ ] ; Anemia [ ]     Endocrine: Diabetes [ ] ; Thyroid dysfunction[ ]    Past Medical History:  Diagnosis Date  . Anxiety   . Family history of breast cancer   . Family history of colon cancer   . Malignant neoplasm of upper-outer quadrant of left female breast ([place]) 01/31/2016  . Neuromyelitis optica ([place])     Current Outpatient Prescriptions  Medication Sig Dispense Refill  . Cholecalciferol (VITAMIN D-1000 MAX ST) 1000 units tablet Take 2 tablets by mouth daily.    Marland Kitchen gabapentin (NEURONTIN) 100 MG capsule     . HYDROcodone-acetaminophen (NORCO) 10-325 MG tablet Take 1 tablet by mouth every 6 (six) hours as needed. 15 tablet 0  . lidocaine-prilocaine (EMLA) cream Apply to affected area once 30 g 3  . omeprazole (PRILOSEC) 40 MG capsule Take 40 mg by mouth daily.    . ondansetron (ZOFRAN) 8 MG tablet Take 1 tablet (8 mg total) by mouth 2 (two) times daily as needed. Start on the third day after chemotherapy. 30 tablet 1  . prochlorperazine (COMPAZINE) 10 MG tablet Take 1 tablet (10 mg total) by mouth every 6 (six) hours as needed (Nausea or vomiting). 30 tablet 1  . riTUXimab (RITUXAN) 100 MG/10ML injection Inject into the vein every 4 (four) months.     No current facility-administered medications for this encounter.     No Known Allergies  Social History   Social History  . Marital status: Married    Spouse name: N/A  . Number of children: N/A  . Years of education: N/A   Occupational History  . Not on file.   Social History Main Topics  . Smoking status: Former Smoker    Types: Cigarettes    Quit date: 09/03/2001  . Smokeless tobacco: Never Used  . Alcohol use No  . Drug use: No  . Sexual activity: Not on file   Other Topics Concern  . Not on file   Social History Narrative  . No narrative on file      Family History  Problem Relation Age of Onset  . Breast cancer Maternal Grandmother     dx in her 73s  . CAD Paternal Grandmother   . Colon cancer Paternal Uncle     dx in  his 65s  . COPD Maternal Grandfather   . CAD Paternal Grandfather   . Breast cancer Cousin     dx in her early to mid 46s; maternal first cousin  . Lung cancer Paternal Uncle     Vitals:   05/22/16 1010  BP: 118/80  Pulse: 82  SpO2: 100%  Weight: 192 lb 8 oz (87.3 kg)    PHYSICAL EXAM: General:  Well appearing. No respiratory difficulty HEENT: normal. alopecic Neck: supple. RIJ port No JVD. Carotids 2+ bilat; no bruits. No lymphadenopathy or thryomegaly appreciated. Cor: PMI nondisplaced. Regular rate & rhythm. No rubs, gallops or murmurs. Lungs: clear Abdomen: obese. soft, nontender, nondistended. No hepatosplenomegaly. No bruits or masses. Good bowel sounds. Extremities: no cyanosis, clubbing, rash, edema Neuro: alert & oriented x 3, cranial nerves grossly intact. moves all 4 extremities w/o difficulty. Affect pleasant.   ASSESSMENT & PLAN:  1. Malignant neoplasm of upper-outer quadrant of left breast, invasive ductal carcinoma, G3, cT2N1M0, stage IIB, ER-/PR-/HER2- -Completed  dose dense Adriamycin, Cytoxan every 2 weeks, with Neulasta support, for 4 cycles in 1/18 -Now followed by weekly carboplatin and Taxol for 12 weeks  -Surgery and XRT pending I reviewed echos personally. EF and Doppler parameters stable. No HF on exam. Will see back in 6 months for repeat echo to ensure stability. I explained incidence of Adriamycin cardiotoxicity in detail include small possibility of very delayed toxicity.   2. neuromyelitis optica spectrum disorder (NOSD) -followed at Kathrine Cords, MD  11:01 AM

## 2016-05-22 NOTE — Progress Notes (Signed)
  Echocardiogram 2D Echocardiogram has been performed.  Grace Moyer 05/22/2016, 10:00 AM

## 2016-05-27 ENCOUNTER — Other Ambulatory Visit: Payer: Self-pay | Admitting: *Deleted

## 2016-05-28 ENCOUNTER — Other Ambulatory Visit (HOSPITAL_BASED_OUTPATIENT_CLINIC_OR_DEPARTMENT_OTHER): Payer: 59

## 2016-05-28 ENCOUNTER — Ambulatory Visit: Payer: 59

## 2016-05-28 ENCOUNTER — Ambulatory Visit (HOSPITAL_BASED_OUTPATIENT_CLINIC_OR_DEPARTMENT_OTHER): Payer: 59

## 2016-05-28 ENCOUNTER — Other Ambulatory Visit: Payer: Self-pay | Admitting: Hematology

## 2016-05-28 DIAGNOSIS — C50412 Malignant neoplasm of upper-outer quadrant of left female breast: Secondary | ICD-10-CM

## 2016-05-28 DIAGNOSIS — Z95828 Presence of other vascular implants and grafts: Secondary | ICD-10-CM

## 2016-05-28 DIAGNOSIS — Z171 Estrogen receptor negative status [ER-]: Secondary | ICD-10-CM

## 2016-05-28 DIAGNOSIS — Z5111 Encounter for antineoplastic chemotherapy: Secondary | ICD-10-CM | POA: Diagnosis not present

## 2016-05-28 LAB — CBC WITH DIFFERENTIAL/PLATELET
BASO%: 0.8 % (ref 0.0–2.0)
Basophils Absolute: 0 10*3/uL (ref 0.0–0.1)
EOS%: 0.8 % (ref 0.0–7.0)
Eosinophils Absolute: 0 10*3/uL (ref 0.0–0.5)
HCT: 26.4 % — ABNORMAL LOW (ref 34.8–46.6)
HGB: 9.2 g/dL — ABNORMAL LOW (ref 11.6–15.9)
LYMPH%: 42.9 % (ref 14.0–49.7)
MCH: 33.8 pg (ref 25.1–34.0)
MCHC: 34.8 g/dL (ref 31.5–36.0)
MCV: 97.1 fL (ref 79.5–101.0)
MONO#: 0.1 10*3/uL (ref 0.1–0.9)
MONO%: 10.5 % (ref 0.0–14.0)
NEUT#: 0.6 10*3/uL — ABNORMAL LOW (ref 1.5–6.5)
NEUT%: 45 % (ref 38.4–76.8)
Platelets: 213 10*3/uL (ref 145–400)
RBC: 2.72 10*6/uL — ABNORMAL LOW (ref 3.70–5.45)
RDW: 17.3 % — ABNORMAL HIGH (ref 11.2–14.5)
WBC: 1.3 10*3/uL — ABNORMAL LOW (ref 3.9–10.3)
lymph#: 0.6 10*3/uL — ABNORMAL LOW (ref 0.9–3.3)
nRBC: 1 % — ABNORMAL HIGH (ref 0–0)

## 2016-05-28 LAB — COMPREHENSIVE METABOLIC PANEL
ALT: 64 U/L — ABNORMAL HIGH (ref 0–55)
AST: 39 U/L — ABNORMAL HIGH (ref 5–34)
Albumin: 3.8 g/dL (ref 3.5–5.0)
Alkaline Phosphatase: 97 U/L (ref 40–150)
Anion Gap: 9 mEq/L (ref 3–11)
BUN: 9 mg/dL (ref 7.0–26.0)
CO2: 22 mEq/L (ref 22–29)
Calcium: 9.2 mg/dL (ref 8.4–10.4)
Chloride: 109 mEq/L (ref 98–109)
Creatinine: 0.7 mg/dL (ref 0.6–1.1)
EGFR: >90 mL/min/{1.73_m2} (ref >90)
Glucose: 95 mg/dl (ref 70–140)
Potassium: 4 mEq/L (ref 3.5–5.1)
Sodium: 140 mEq/L (ref 136–145)
Total Bilirubin: 0.86 mg/dL (ref 0.20–1.20)
Total Protein: 6.2 g/dL — ABNORMAL LOW (ref 6.4–8.3)

## 2016-05-28 MED ORDER — SODIUM CHLORIDE 0.9% FLUSH
10.0000 mL | INTRAVENOUS | Status: DC | PRN
  Administered 2016-05-28: 10 mL via INTRAVENOUS
  Filled 2016-05-28: qty 10

## 2016-05-28 MED ORDER — FAMOTIDINE IN NACL 20-0.9 MG/50ML-% IV SOLN
INTRAVENOUS | Status: AC
  Filled 2016-05-28: qty 50

## 2016-05-28 MED ORDER — DEXTROSE 5 % IV SOLN
80.0000 mg/m2 | Freq: Once | INTRAVENOUS | Status: AC
  Administered 2016-05-28: 162 mg via INTRAVENOUS
  Filled 2016-05-28: qty 27

## 2016-05-28 MED ORDER — HEPARIN SOD (PORK) LOCK FLUSH 100 UNIT/ML IV SOLN
500.0000 [IU] | Freq: Once | INTRAVENOUS | Status: AC | PRN
  Administered 2016-05-28: 500 [IU]
  Filled 2016-05-28: qty 5

## 2016-05-28 MED ORDER — SODIUM CHLORIDE 0.9 % IV SOLN
Freq: Once | INTRAVENOUS | Status: AC
  Administered 2016-05-28: 13:00:00 via INTRAVENOUS

## 2016-05-28 MED ORDER — DEXAMETHASONE SODIUM PHOSPHATE 10 MG/ML IJ SOLN
INTRAMUSCULAR | Status: AC
  Filled 2016-05-28: qty 1

## 2016-05-28 MED ORDER — DIPHENHYDRAMINE HCL 50 MG/ML IJ SOLN
25.0000 mg | Freq: Once | INTRAMUSCULAR | Status: AC
  Administered 2016-05-28: 25 mg via INTRAVENOUS

## 2016-05-28 MED ORDER — SODIUM CHLORIDE 0.9% FLUSH
10.0000 mL | INTRAVENOUS | Status: DC | PRN
  Administered 2016-05-28: 10 mL
  Filled 2016-05-28: qty 10

## 2016-05-28 MED ORDER — DIPHENHYDRAMINE HCL 50 MG/ML IJ SOLN
INTRAMUSCULAR | Status: AC
  Filled 2016-05-28: qty 1

## 2016-05-28 MED ORDER — FAMOTIDINE IN NACL 20-0.9 MG/50ML-% IV SOLN
20.0000 mg | Freq: Once | INTRAVENOUS | Status: AC
  Administered 2016-05-28: 20 mg via INTRAVENOUS

## 2016-05-28 MED ORDER — DEXAMETHASONE SODIUM PHOSPHATE 10 MG/ML IJ SOLN
10.0000 mg | Freq: Once | INTRAMUSCULAR | Status: AC
  Administered 2016-05-28: 10 mg via INTRAVENOUS

## 2016-05-28 NOTE — Progress Notes (Signed)
Grace Moyer  Telephone:([phone] [phone]  Clinic Follow up Note   Patient Care Team: Maisie Fus, MD as PCP - General (Obstetrics and Gynecology) Collene Gobble, MD as Attending Physician (Psychiatry) Rolm Bookbinder, MD as Consulting Physician (General Surgery) Truitt Merle, MD as Consulting Physician (Hematology) Kyung Rudd, MD as Consulting Physician (Radiation Oncology) 05/21/2016  CHIEF COMPLAINTS:  Follow up left breast triple negative cancer  Oncology History   Malignant neoplasm of upper-outer quadrant of left female breast [facility])   Staging form: Breast, AJCC 7th Edition   - Clinical stage from 01/29/2016: Stage IIB (T2, N1, M0) - Signed by Truitt Merle, MD on 02/06/2016      Malignant neoplasm of upper-outer quadrant of left female breast ([place])   01/28/2016 Mammogram    Diagnostic MM and US showed a 2.9cm (3.2X2.2X2.5cm on [place]) mass in [place], multiple enlarged left axillary nodes, largest 2.5cm.       01/29/2016 Initial Diagnosis    Malignant neoplasm of upper-outer quadrant of left female breast ([place])      01/29/2016 Initial Biopsy    Left breast mass and axillary node biopsy showed IDC, G3,       01/29/2016 Receptors her2    ER-, PR-, HER2-, Ki67 85%      02/11/2016 Imaging    Bilateral breast MRI with and without contrast showed a 3.3 x 2.6 x 2.6 cm mass in the upper outer left breast, and left axillary lymphadenopathy, at least 3 abnormal lymph nodes, no other additional sites of concern.      02/12/2016 Imaging    CT chest, abdomen and pelvis with contrast showed a 2.4 x 2.7 cm lesion in the upper outer left breast, left axillary node metastasis measuring up to 1.3 cm, no evidence of distant metastasis.      02/12/2016 Imaging    Bone scan was negative for skeletal metastasis.      02/13/2016 -  Neo-Adjuvant Chemotherapy    Dose dense Adriamycin 60 mg/m, Cytoxan 600 mg/m, every 2 weeks, for 4 cycles, followed by weekly  carboplatin and Taxol for 12 weeks       03/27/2016 Genetic Testing    Patient has genetic testing done for personal history of breast cancer, family history of cancer. ATM c.2606C>T VUS identified on the Breast/GYN panel.  Negative genetic testing for the MSH2 inversion analysis ([place] inversion). The Breast/GYN gene panel offered by [facility] includes sequencing and rearrangement analysis for the following 23 genes:  ATM, BARD1, BRCA1, BRCA2, BRIP1, CDH1, CHEK2, EPCAM, FANCC, MLH1, MSH2, MSH6, MUTYH, NBN, NF1, PALB2, PMS2, POLD1, PTEN, RAD51C, RAD51D, RECQL, and TP53.         HISTORY OF PRESENTING ILLNESS (02/06/16) :  Grace Moyer 56 y.o. female is here because of her recently diagnosed left breast cancer. She was accompanied By her husband to our multidisciplinary breast clinic today.  This was discovered by screening mammogram, she had no palpable breast mass or axilla note. She denies any other new symptoms. She underwent a diagnostic mammogram and ultrasound on 01/28/2016, which showed a 2.9 cm mass in the upper outer quadrant, and multiple enlarged left axillary nodes, largest 2.5 cm. She underwent core needle biopsy of the left breast mass and left axillary node, both showed invasive ductal carcinoma, grade 3, triple negative, Ki-67 85%.  She developed bilateral numbness and tingling of her legs and hands at the end of May 2017. She underwent a multiple scans, and lab test, and was finally  seen by neurologist Dr. Moshe Cipro, and was diagnosed with neuromyelitis optica spectrum disorder (NOSD). She initially received high-dose steroids, and then started Rituxan infusion, status post 2 infusions, now is on every four-month schedule, next due in Jan 2018. She states her neuropathy has been stable, she denies any significant vision problem, or other neurological symptoms. Her hand function and gait are normal. She is an Clinical biochemist.   GYN HISTORY  Menarchal: 13 LMP: 59  (hysterectomy) Contraceptive: 3 years  HRT: n/a  G1P1: 41 yo daughter   CURRENT THERAPY: Neoadjuvant dose dense Adriamycin 60 mg/m, Cytoxan 600 mg/m, every 2 weeks, for 4 cycles, followed by weekly carboplatin and Taxol for 12 weeks started 02/13/2016  INTERIM HISTORY Grace Moyer returns for follow-up and 9th cycle carbo and taxol with her husband. She was seen in the infusion room. The patient reports a right thigh lump that is making it difficult for her to sit due to the pain, it started about 4 days ago. No fever or chills. Reports foot and finger numbness and nail changes. She is fatigued. Reports she felt achy for a few days after having Granix, but it was tolerable.  MEDICAL HISTORY:  Past Medical History:  Diagnosis Date  . Anxiety   . Family history of breast cancer   . Family history of colon cancer   . Malignant neoplasm of upper-outer quadrant of left female breast ([place]) 01/31/2016  . Neuromyelitis optica ([place])     SURGICAL HISTORY: Past Surgical History:  Procedure Laterality Date  . ABDOMINAL HYSTERECTOMY     partial  . EYE SURGERY    . PORTACATH PLACEMENT Right 02/11/2016   Procedure: INSERTION PORT-A-CATH WITH [place];  Surgeon: Rolm Bookbinder, MD;  Location: [facility];  Service: General;  Laterality: Right;    SOCIAL HISTORY: Social History   Social History  . Marital status: Married    Spouse name: N/A  . Number of children: N/A  . Years of education: N/A   Occupational History  . Not on file.   Social History Main Topics  . Smoking status: Former Smoker    Types: Cigarettes    Quit date: 09/03/2001  . Smokeless tobacco: Never Used  . Alcohol use No  . Drug use: No  . Sexual activity: Not on file   Other Topics Concern  . Not on file   Social History Narrative  . No narrative on file   Two trips reported in May with family.   FAMILY HISTORY: Family History  Problem Relation Age of Onset  . Breast cancer Maternal Grandmother      dx in her 49s  . CAD Paternal Grandmother   . Colon cancer Paternal Uncle     dx in his 20s  . COPD Maternal Grandfather   . CAD Paternal Grandfather   . Breast cancer Cousin     dx in her early to mid 49s; maternal first cousin  . Lung cancer Paternal Uncle     ALLERGIES:  has No Known Allergies.  MEDICATIONS:  Current Outpatient Prescriptions  Medication Sig Dispense Refill  . Cholecalciferol (VITAMIN D-1000 MAX ST) 1000 units tablet Take 2 tablets by mouth daily.    Marland Kitchen gabapentin (NEURONTIN) 100 MG capsule     . HYDROcodone-acetaminophen (NORCO) 10-325 MG tablet Take 1 tablet by mouth every 6 (six) hours as needed. 15 tablet 0  . lidocaine-prilocaine (EMLA) cream Apply to affected area once 30 g 3  . omeprazole (PRILOSEC) 40 MG capsule Take 40  mg by mouth daily.    . ondansetron (ZOFRAN) 8 MG tablet Take 1 tablet (8 mg total) by mouth 2 (two) times daily as needed. Start on the third day after chemotherapy. 30 tablet 1  . prochlorperazine (COMPAZINE) 10 MG tablet Take 1 tablet (10 mg total) by mouth every 6 (six) hours as needed (Nausea or vomiting). 30 tablet 1  . riTUXimab (RITUXAN) 100 MG/10ML injection Inject into the vein every 4 (four) months.     No current facility-administered medications for this visit.    Facility-Administered Medications Ordered in Other Visits  Medication Dose Route Frequency Provider Last Rate Last Dose  . sodium chloride flush (NS) 0.9 % injection 10 mL  10 mL Intracatheter PRN Truitt Merle, MD   10 mL at 05/28/16 1507    REVIEW OF SYSTEMS:   Constitutional: Denies fevers, chills or abnormal night sweats (+) fatigue Eyes: Denies blurriness of vision, double vision or watery eyes Ears, nose, mouth, throat, and face: Denies mucositis or sore throat Respiratory: Denies cough, dyspnea or wheezes Cardiovascular: Denies palpitation, chest discomfort or lower extremity swelling Gastrointestinal:  Denies nausea, heartburn Skin: Denies abnormal skin  rashes Lymphatics: Denies new lymphadenopathy or easy bruising Neurological:Denies new weaknesses (+) tingling/numbness, neuropathy stable Behavioral/Psych: (+) anxiety about cancer recurrance All other systems were reviewed with the patient and are negative.  PHYSICAL EXAMINATION: ECOG PERFORMANCE STATUS: 1 - Symptomatic but completely ambulatory Her pressure 120/56, heart rate 92, respiratory rate 18, temperature 99.2, pulse ox 100% on room air GENERAL:alert, no distress and comfortable.  SKIN: skin color, texture, turgor are normal, no rashes (+) 3.5 x 2 cm lump in the low part of right buttock, with surrounding skin erythema, no whitehead, (+) tender. She also has a smaller skin lump in the low part of left breast, without significant sounding skin erythema  EYES: normal, conjunctiva are pink and non-injected, sclera clear OROPHARYNX:no exudate, no erythema and lips, buccal mucosa, and tongue normal  NECK: supple, thyroid normal size, non-tender, without nodularity. (+) Incision in the right lower neck for port. Port appears clean, neck not significantly swollen, no erythema, no sign of infection. LYMPH:  no palpable lymphadenopathy in the cervical, axillary or inguinal LUNGS: clear to auscultation and percussion with normal breathing effort HEART: regular rate & rhythm and no murmurs and no lower extremity edema ABDOMEN:abdomen soft, non-tender and normal bowel sounds MUSCULOSKELETALl:no cyanosis of digits and no clubbing  PSYCH: alert & oriented x 3 with fluent speech NEURO: no focal motor/sensory deficits Breasts: Breast inspection showed them to be symmetrical with no nipple discharge. Palpation of the breasts and axilla revealed no obvious mass that I could appreciate, there is fullness at the upper-outter quadrant of left breast.    LABORATORY DATA:  I have reviewed the data as listed CBC Latest Ref Rng & Units 05/28/2016 05/21/2016 05/14/2016  WBC 3.9 - 10.3 10e3/uL 1.3(L) 1.4(L)  2.6(L)  Hemoglobin 11.6 - 15.9 g/dL 9.2(L) 9.3(L) 10.4(L)  Hematocrit 34.8 - 46.6 % 26.4(L) 26.2(L) 29.9(L)  Platelets 145 - 400 10e3/uL 213 197 140(L)   CMP Latest Ref Rng & Units 05/28/2016 05/21/2016 05/14/2016  Glucose 70 - 140 mg/dl 95 91 91  BUN 7.0 - 26.0 mg/dL 9.0 10.2 9.4  Creatinine 0.6 - 1.1 mg/dL 0.7 0.6 0.7  Sodium 136 - 145 mEq/L 140 140 141  Potassium 3.5 - 5.1 mEq/L 4.0 4.0 4.1  Chloride 101 - 111 mmol/L - - -  CO2 22 - 29 mEq/L 22 22 23  Calcium 8.4 - 10.4 mg/dL 9.2 9.1 9.7  Total Protein 6.4 - 8.3 g/dL 6.2(L) 6.2(L) 6.6  Total Bilirubin 0.20 - 1.20 mg/dL 0.86 0.86 1.02  Alkaline Phos 40 - 150 U/L 97 89 113  AST 5 - 34 U/L 39(H) 26 33  ALT 0 - 55 U/L 64(H) 44 74(H)   ANC 1.5  PATHOLOGY REPORT  Diagnosis 01/29/2016 1. Breast, left, needle core biopsy, 1:30 o'clock - INVASIVE DUCTAL CARCINOMA, GRADE 3. - LYMPHOVASCULAR INVOLVEMENT BY TUMOR. 2. Lymph node, needle/core biopsy, left axillary - METASTATIC CARCINOMA.  RADIOGRAPHIC STUDIES: I have personally reviewed the radiological images as listed and agreed with the findings in the report. No results found.  Bone scan 02/12/2016 IMPRESSION: No evidence of skeletal metastatic disease.  CT chest abdomen pelvis w contrast 02/12/2016 IMPRESSION: 2.4 x 2.7 cm lesion in the upper outer left breast, corresponding to the patient's known breast neoplasm. Left axillary nodal metastasis measuring up to 13 mm short axis. No evidence of distant metastasis.  ASSESSMENT & PLAN: 56 y.o. Caucasian woman with past medical history of depression, presented with a screening mammogram discovered left breast cancer   1. Malignant neoplasm of upper-outer quadrant of left breast, invasive ductal carcinoma, G3, cT2N1M0, stage IIB, ER-/PR-/HER2- -I previously reviewed her imaging findings and the biopsy results in great detail with patient and her husband. -I previously discussed her breast MRI, CT scan and bone scan findings, which  all confirmed the left breast lesion and axillary adenopathy, no other distant metastasis. -she was previously seen by Dr. Donne Hazel and surgical options were discussed  -We previously discussed that triple negative breast cancers are much more aggressive, and her risk of cancer recurrence after breast surgery is pretty high, giving the multiple positive lymph nodes. I recommend her to consider neoadjuvant chemotherapy, to reduce her risk of recurrence, and downstage her breast cancer, to make lumpectomy and sentinel lymph node biopsy as a more feasible surgery. -I previously recommended her to have dose dense Adriamycin, Cytoxan every 2 weeks, with Neulasta support, for 4 cycles, followed by weekly carboplatin and Taxol for 12 weeks  -the goal of therapy is curative  -Due to her underlying NOSD, and existing peripheral neuropathy, my main concern of long-term side effects from chemotherapy is worsening neuropathy, especially from Taxol, we will monitor closely, and may need to hold it if her neuropathy gets worse.  -Her baseline echocardiogram was normal. -Patient is tolerating chemotherapy well, left breast mass is not palpable anymore, she is clinically responding to chemotherapy, we'll continue chemotherapy. -Lab reviewed. Mild anemia, normal platlets at 197K, ANC 0.7 on 05/21/16. Due to low ANC, we proceeded with 7th week Taxol but no Carbo that day. -neutropenia resolved with Granix, will continue on day 2 after chemo  -Lab reviewed, adequate for treatment, we'll continue chemotherapy today with [place] and Taxol. - Repeat breast MRI 2 weeks following completion of chemotherapy. Surgery will be scheduled in May/June with Dr.Wakefield.  2. Right low buttock infected sebaceous cyst -I asked surgeon Dr. Lucia Gaskins to look at it today, he recommends I&D, which is scheduled for tomorrow morning at Dr. Pollie Friar office -I called in Augmentin 838m bid for 7 days  -I encourage her to use warm compression    3. Genetics -Given her young age and triple negative disease, we recommend her to have genetic testing to rule out inheritable breast cancer syndrome, and the test result may impact her surgery. -ATM c.2606C>T VUS identified on the Breast/Gyn panel.  Negative genetic testing  for the MSH2 inversion analysis ([place]and inversion). The Breast/GYN gene panel offered by [facility] includes sequencing and rearrangement analysis for the following 23 genes:  ATM, BARD1, BRCA1, BRCA2, BRIP1, CDH1, CHEK2, EPCAM, FANCC, MLH1, MSH2, MSH6, MUTYH, NBN, NF1, PALB2, PMS2, POLD1, PTEN, RAD51C, RAD51D, RECQL, and TP53.   The report date is March 27, 2016  4. neuromyelitis optica spectrum disorder (NOSD) -she will continue follow up with her neurologist Dr. Moshe Cipro at [facility]  -she is on rituximab every 4 weeks. It will be OK to continue her Rituximab when she is on chemotherapy, but the combination therapy will further compromise her immune system. -I have spoken with Dr. Moshe Cipro about her breast cancer treatment, he is agreeable. -She previously received Rituxan in our cancer center on 03/21/2016, tolerated generally well. She prefers to get next dose Rituxan here with [place] in May, I will arrange.  5. Anemia secondary to chemo -overall mild and stable -We'll monitor closely  Plan -I will see her back in 2 and 4 weeks with lab. -lab reviewed and proceed with cycle 8 taxol and [place] today and continue weekly with Granix given on day 2. -She will start Augmentin today, for 7 days. -She is scheduled to see Dr. Lucia Gaskins tomorrow morning for I&D of her right buttock infected sebaceous cyst  No orders of the defined types were placed in this encounter.   All questions were answered. The patient knows to call the clinic with any problems, questions or concerns.  I spent 20 minutes counseling the patient face to face. The total time spent in the appointment was 25 minutes and more than 50% was on counseling.     Truitt Merle,  MD 06/04/2016  This document serves as a record of services personally performed by Truitt Merle, MD. It was created on her behalf by Darcus Austin, a trained medical scribe. The creation of this record is based on the scribe's personal observations and the provider's statements to them. This document has been checked and approved by the attending provider.

## 2016-05-28 NOTE — Patient Instructions (Signed)
[facility] Discharge Instructions for Patients Receiving Chemotherapy  Today you received the following chemotherapy agents: Taxol   To help prevent nausea and vomiting after your treatment, we encourage you to take your nausea medication as directed.    If you develop nausea and vomiting that is not controlled by your nausea medication, call the clinic.   BELOW ARE SYMPTOMS THAT SHOULD BE REPORTED IMMEDIATELY:  *FEVER GREATER THAN 100.5 F  *CHILLS WITH OR WITHOUT FEVER  NAUSEA AND VOMITING THAT IS NOT CONTROLLED WITH YOUR NAUSEA MEDICATION  *UNUSUAL SHORTNESS OF BREATH  *UNUSUAL BRUISING OR BLEEDING  TENDERNESS IN MOUTH AND THROAT WITH OR WITHOUT PRESENCE OF ULCERS  *URINARY PROBLEMS  *BOWEL PROBLEMS  UNUSUAL RASH Items with * indicate a potential emergency and should be followed up as soon as possible.  Feel free to call the clinic you have any questions or concerns. The clinic phone number is [phone].  Please show the [facility] at check-in to the Emergency Department and triage nurse.   Neutropenia Neutropenia is a condition that occurs when you have a lower-than-normal level of a type of white blood cell (neutrophil) in your body. Neutrophils are made in the spongy center of large bones (bone marrow) and they fight infections. Neutrophils are your body's main defense against bacterial and fungal infections. The fewer neutrophils you have and the longer your body remains without them, the greater your risk of getting a severe infection. What are the causes? This condition can occur if your body uses up or destroys neutrophils faster than your bone marrow can make them. This problem may happen because of:  Bacterial or fungal infection.  Allergic disorders.  Reactions to some medicines.  Autoimmune disease.  An enlarged spleen. This condition can also occur if your bone marrow does not produce enough neutrophils. This problem may be  caused by:  Cancer.  Cancer treatments, such as radiation or chemotherapy.  Viral infections.  Medicines, such as phenytoin.  Vitamin B12 deficiency.  Diseases of the bone marrow.  Environmental toxins, such as insecticides. What are the signs or symptoms? This condition does not usually cause symptoms. If symptoms are present, they are usually caused by an underlying infection. Symptoms of an infection may include:  Fever.  Chills.  Swollen glands.  Oral or anal ulcers.  Cough and shortness of breath.  Rash.  Skin infection.  Fatigue. How is this diagnosed? Your health care provider may suspect neutropenia if you have:  A condition that may cause neutropenia.  Symptoms of infection, especially fever.  Frequent and unusual infections. You will have a medical history and physical exam. Tests will also be done, such as:  A complete blood count (CBC).  A procedure to collect a sample of bone marrow for examination (bone marrow biopsy).  A chest X-ray.  A urine culture.  A blood culture. How is this treated? Treatment depends on the underlying cause and severity of your condition. Mild neutropenia may not require treatment. Treatment may include medicines, such as:  Antibiotic medicine given through an IV tube.  Antiviral medicines.  Antifungal medicines.  A medicine to increase neutrophil production (colony-stimulating factor). You may get this drug through an IV tube or by injection.  Steroids given through an IV tube. If an underlying condition is causing neutropenia, you may need treatment for that condition. If medicines you are taking are causing neutropenia, your health care provider may have you stop taking those medicines. Follow these instructions at  home: Medicines   Take over-the-counter and prescription medicines only as told by your health care provider.  Get a seasonal flu shot (influenza vaccine). Lifestyle   Do not eat  unpasteurized foods.Do not eat unwashed raw fruits or vegetables.  Avoid exposure to groups of people or children.  Avoid being around people who are sick.  Avoid being around dirt or dust, such as in construction areas or gardens.  Do not provide direct care for pets. Avoid animal droppings. Do not clean litter boxes and bird cages. Hygiene    Bathe daily.  Clean the area between the genitals and the anus (perineal area) after you urinate or have a bowel movement. If you are female, wipe from front to back.  Brush your teeth with a soft toothbrush before and after meals.  Do not use a razor that has a blade. Use an electric razor to remove hair.  Wash your hands often. Make sure others who come in contact with you also wash their hands. If soap and water are not available, use hand sanitizer. General instructions   Do not have sex unless your health care provider has approved.  Take actions to avoid cuts and burns. For example:  Be cautious when you use knives. Always cut away from yourself.  Keep knives in protective sheaths or guards when not in use.  Use oven mitts when you cook with a hot stove, oven, or grill.  Stand a safe distance away from open fires.  Avoid people who received a vaccine in the past 30 days if that vaccine contained a live version of the germ (live vaccine). You should not get a live vaccine. Common live vaccines are varicella, measles, mumps, and rubella.  Do not share food utensils.  Do not use tampons, enemas, or rectal suppositories unless your health care provider has approved.  Keep all appointments as told by your health care provider. This is important. Contact a health care provider if:  You have a fever.  You have chills or you start to shake.  You have:  A sore throat.  A warm, red, or tender area on your skin.  A cough.  Frequent or painful urination.  Vaginal discharge or itching.  You develop:  Sores in your mouth  or anus.  Swollen lymph nodes.  Red streaks on the skin.  A rash.  You feel:  Nauseous or you vomit.  Very fatigued.  Short of breath. This information is not intended to replace advice given to you by your health care provider. Make sure you discuss any questions you have with your health care provider. Document Released: 08/09/2001 Document Revised: 07/26/2015 Document Reviewed: 08/30/2014 Elsevier Interactive Patient Education  2017 [employer].

## 2016-05-28 NOTE — Progress Notes (Signed)
Per MD no carboplatin today due to low ANC of 0.6. Educated on neutropenic precautions and schedule given for granix injection tomorrow.  ' Wylene Simmer, BSN, RN 05/28/2016 1:01 PM

## 2016-05-29 ENCOUNTER — Ambulatory Visit (HOSPITAL_BASED_OUTPATIENT_CLINIC_OR_DEPARTMENT_OTHER): Payer: 59

## 2016-05-29 VITALS — BP 137/82 | HR 90 | Temp 98.8°F | Resp 18

## 2016-05-29 DIAGNOSIS — C50412 Malignant neoplasm of upper-outer quadrant of left female breast: Secondary | ICD-10-CM | POA: Diagnosis not present

## 2016-05-29 DIAGNOSIS — Z171 Estrogen receptor negative status [ER-]: Secondary | ICD-10-CM

## 2016-05-29 DIAGNOSIS — Z5189 Encounter for other specified aftercare: Secondary | ICD-10-CM

## 2016-05-29 MED ORDER — TBO-FILGRASTIM 480 MCG/0.8ML ~~LOC~~ SOSY
480.0000 ug | PREFILLED_SYRINGE | Freq: Once | SUBCUTANEOUS | Status: AC
  Administered 2016-05-29: 480 ug via SUBCUTANEOUS
  Filled 2016-05-29: qty 0.8

## 2016-05-29 NOTE — Patient Instructions (Signed)
Tbo-Filgrastim injection What is this medicine? TBO-FILGRASTIM (T B O fil GRA stim) is a granulocyte colony-stimulating factor that stimulates the growth of neutrophils, a type of white blood cell important in the body's fight against infection. It is used to reduce the incidence of fever and infection in patients with certain types of cancer who are receiving chemotherapy that affects the bone marrow. This medicine may be used for other purposes; ask your health care provider or pharmacist if you have questions. COMMON BRAND NAME(S): Granix What should I tell my health care provider before I take this medicine? They need to know if you have any of these conditions: -bone scan or tests planned -kidney disease -sickle cell anemia -an unusual or allergic reaction to tbo-filgrastim, filgrastim, pegfilgrastim, other medicines, foods, dyes, or preservatives -pregnant or trying to get pregnant -breast-feeding How should I use this medicine? This medicine is for injection under the skin. If you get this medicine at home, you will be taught how to prepare and give this medicine. Refer to the Instructions for Use that come with your medication packaging. Use exactly as directed. Take your medicine at regular intervals. Do not take your medicine more often than directed. It is important that you put your used needles and syringes in a special sharps container. Do not put them in a trash can. If you do not have a sharps container, call your pharmacist or healthcare provider to get one. Talk to your pediatrician regarding the use of this medicine in children. Special care may be needed. Overdosage: If you think you have taken too much of this medicine contact a poison control center or emergency room at once. NOTE: This medicine is only for you. Do not share this medicine with others. What if I miss a dose? It is important not to miss your dose. Call your doctor or health care professional if you miss a  dose. What may interact with this medicine? This medicine may interact with the following medications: -medicines that may cause a release of neutrophils, such as lithium This list may not describe all possible interactions. Give your health care provider a list of all the medicines, herbs, non-prescription drugs, or dietary supplements you use. Also tell them if you smoke, drink alcohol, or use illegal drugs. Some items may interact with your medicine. What should I watch for while using this medicine? You may need blood work done while you are taking this medicine. What side effects may I notice from receiving this medicine? Side effects that you should report to your doctor or health care professional as soon as possible: -allergic reactions like skin rash, itching or hives, swelling of the face, lips, or tongue -blood in the urine -dark urine -dizziness -fast heartbeat -feeling faint -shortness of breath or breathing problems -signs and symptoms of infection like fever or chills; cough; or sore throat -signs and symptoms of kidney injury like trouble passing urine or change in the amount of urine -stomach or side pain, or pain at the shoulder -sweating -swelling of the legs, ankles, or abdomen -tiredness Side effects that usually do not require medical attention (report to your doctor or health care professional if they continue or are bothersome): -bone pain -headache -muscle pain -vomiting This list may not describe all possible side effects. Call your doctor for medical advice about side effects. You may report side effects to FDA at 1-800-FDA-1088. Where should I keep my medicine? Keep out of the reach of children. Store in a refrigerator between   2 and 8 degrees C (36 and 46 degrees F). Keep in carton to protect from light. Throw away this medicine if it is left out of the refrigerator for more than 5 consecutive days. Throw away any unused medicine after the expiration  date. NOTE: This sheet is a summary. It may not cover all possible information. If you have questions about this medicine, talk to your doctor, pharmacist, or health care provider.  2018 Elsevier/Gold Standard (2015-04-09 19:07:04)  

## 2016-06-02 DIAGNOSIS — G360 Neuromyelitis optica [Devic]: Secondary | ICD-10-CM | POA: Diagnosis not present

## 2016-06-02 DIAGNOSIS — Z79899 Other long term (current) drug therapy: Secondary | ICD-10-CM | POA: Diagnosis not present

## 2016-06-04 ENCOUNTER — Ambulatory Visit (HOSPITAL_BASED_OUTPATIENT_CLINIC_OR_DEPARTMENT_OTHER): Payer: 59

## 2016-06-04 ENCOUNTER — Ambulatory Visit (HOSPITAL_BASED_OUTPATIENT_CLINIC_OR_DEPARTMENT_OTHER): Payer: 59 | Admitting: Hematology

## 2016-06-04 ENCOUNTER — Other Ambulatory Visit (HOSPITAL_BASED_OUTPATIENT_CLINIC_OR_DEPARTMENT_OTHER): Payer: 59

## 2016-06-04 ENCOUNTER — Ambulatory Visit: Payer: 59

## 2016-06-04 VITALS — BP 120/56 | HR 92 | Temp 99.2°F | Resp 18

## 2016-06-04 DIAGNOSIS — L723 Sebaceous cyst: Secondary | ICD-10-CM

## 2016-06-04 DIAGNOSIS — Z171 Estrogen receptor negative status [ER-]: Secondary | ICD-10-CM

## 2016-06-04 DIAGNOSIS — C50412 Malignant neoplasm of upper-outer quadrant of left female breast: Secondary | ICD-10-CM

## 2016-06-04 DIAGNOSIS — Z5111 Encounter for antineoplastic chemotherapy: Secondary | ICD-10-CM | POA: Diagnosis not present

## 2016-06-04 DIAGNOSIS — G360 Neuromyelitis optica [Devic]: Secondary | ICD-10-CM

## 2016-06-04 DIAGNOSIS — D6481 Anemia due to antineoplastic chemotherapy: Secondary | ICD-10-CM | POA: Diagnosis not present

## 2016-06-04 DIAGNOSIS — Z95828 Presence of other vascular implants and grafts: Secondary | ICD-10-CM

## 2016-06-04 LAB — COMPREHENSIVE METABOLIC PANEL
ALT: 58 U/L — ABNORMAL HIGH (ref 0–55)
AST: 36 U/L — ABNORMAL HIGH (ref 5–34)
Albumin: 3.8 g/dL (ref 3.5–5.0)
Alkaline Phosphatase: 99 U/L (ref 40–150)
Anion Gap: 9 mEq/L (ref 3–11)
BUN: 7.8 mg/dL (ref 7.0–26.0)
CO2: 22 mEq/L (ref 22–29)
Calcium: 9.3 mg/dL (ref 8.4–10.4)
Chloride: 108 mEq/L (ref 98–109)
Creatinine: 0.7 mg/dL (ref 0.6–1.1)
EGFR: >90 mL/min/{1.73_m2} (ref >90)
Glucose: 98 mg/dl (ref 70–140)
Potassium: 3.9 mEq/L (ref 3.5–5.1)
Sodium: 139 mEq/L (ref 136–145)
Total Bilirubin: 0.98 mg/dL (ref 0.20–1.20)
Total Protein: 6.3 g/dL — ABNORMAL LOW (ref 6.4–8.3)

## 2016-06-04 LAB — CBC WITH DIFFERENTIAL/PLATELET
BASO%: 0.7 % (ref 0.0–2.0)
Basophils Absolute: 0 10*3/uL (ref 0.0–0.1)
EOS%: 0.4 % (ref 0.0–7.0)
Eosinophils Absolute: 0 10*3/uL (ref 0.0–0.5)
HCT: 28.2 % — ABNORMAL LOW (ref 34.8–46.6)
HGB: 9.7 g/dL — ABNORMAL LOW (ref 11.6–15.9)
LYMPH%: 27.4 % (ref 14.0–49.7)
MCH: 33.7 pg (ref 25.1–34.0)
MCHC: 34.4 g/dL (ref 31.5–36.0)
MCV: 97.9 fL (ref 79.5–101.0)
MONO#: 0.4 10*3/uL (ref 0.1–0.9)
MONO%: 15.9 % — ABNORMAL HIGH (ref 0.0–14.0)
NEUT#: 1.5 10*3/uL (ref 1.5–6.5)
NEUT%: 55.6 % (ref 38.4–76.8)
Platelets: 213 10*3/uL (ref 145–400)
RBC: 2.88 10*6/uL — ABNORMAL LOW (ref 3.70–5.45)
RDW: 16.6 % — ABNORMAL HIGH (ref 11.2–14.5)
WBC: 2.7 10*3/uL — ABNORMAL LOW (ref 3.9–10.3)
lymph#: 0.7 10*3/uL — ABNORMAL LOW (ref 0.9–3.3)
nRBC: 2 % — ABNORMAL HIGH (ref 0–0)

## 2016-06-04 MED ORDER — AMOXICILLIN-POT CLAVULANATE 875-125 MG PO TABS: 1.0000 | ORAL_TABLET | Freq: Two times a day (BID) | ORAL | 0 refills | Status: DC

## 2016-06-04 MED ORDER — HEPARIN SOD (PORK) LOCK FLUSH 100 UNIT/ML IV SOLN
500.0000 [IU] | Freq: Once | INTRAVENOUS | Status: AC | PRN
  Administered 2016-06-04: 500 [IU]
  Filled 2016-06-04: qty 5

## 2016-06-04 MED ORDER — SODIUM CHLORIDE 0.9 % IV SOLN
Freq: Once | INTRAVENOUS | Status: AC
  Administered 2016-06-04: 13:00:00 via INTRAVENOUS

## 2016-06-04 MED ORDER — SODIUM CHLORIDE 0.9 % IV SOLN
290.0000 mg | Freq: Once | INTRAVENOUS | Status: AC
  Administered 2016-06-04: 290 mg via INTRAVENOUS
  Filled 2016-06-04: qty 29

## 2016-06-04 MED ORDER — SODIUM CHLORIDE 0.9% FLUSH
10.0000 mL | INTRAVENOUS | Status: DC | PRN
  Administered 2016-06-04: 10 mL
  Filled 2016-06-04: qty 10

## 2016-06-04 MED ORDER — DEXAMETHASONE SODIUM PHOSPHATE 10 MG/ML IJ SOLN
INTRAMUSCULAR | Status: AC
  Filled 2016-06-04: qty 1

## 2016-06-04 MED ORDER — DIPHENHYDRAMINE HCL 50 MG/ML IJ SOLN
INTRAMUSCULAR | Status: AC
  Filled 2016-06-04: qty 1

## 2016-06-04 MED ORDER — DEXAMETHASONE SODIUM PHOSPHATE 10 MG/ML IJ SOLN
10.0000 mg | Freq: Once | INTRAMUSCULAR | Status: AC
  Administered 2016-06-04: 10 mg via INTRAVENOUS

## 2016-06-04 MED ORDER — FAMOTIDINE IN NACL 20-0.9 MG/50ML-% IV SOLN
INTRAVENOUS | Status: AC
Start: 2016-06-04 — End: 2016-06-04
  Filled 2016-06-04: qty 50

## 2016-06-04 MED ORDER — DIPHENHYDRAMINE HCL 50 MG/ML IJ SOLN
50.0000 mg | Freq: Once | INTRAMUSCULAR | Status: AC
  Administered 2016-06-04: 50 mg via INTRAVENOUS

## 2016-06-04 MED ORDER — FAMOTIDINE IN NACL 20-0.9 MG/50ML-% IV SOLN
20.0000 mg | Freq: Once | INTRAVENOUS | Status: AC
  Administered 2016-06-04: 20 mg via INTRAVENOUS

## 2016-06-04 MED ORDER — SODIUM CHLORIDE 0.9% FLUSH
10.0000 mL | INTRAVENOUS | Status: DC | PRN
  Administered 2016-06-04: 10 mL via INTRAVENOUS
  Filled 2016-06-04: qty 10

## 2016-06-04 MED ORDER — PACLITAXEL CHEMO INJECTION 300 MG/50ML
80.0000 mg/m2 | Freq: Once | INTRAVENOUS | Status: AC
  Administered 2016-06-04: 162 mg via INTRAVENOUS
  Filled 2016-06-04: qty 27

## 2016-06-04 NOTE — Patient Instructions (Signed)
[facility] Discharge Instructions for Patients Receiving Chemotherapy  Today you received the following chemotherapy agents Taxol/Carboplatin To help prevent nausea and vomiting after your treatment, we encourage you to take your nausea medication as prescribed.   If you develop nausea and vomiting that is not controlled by your nausea medication, call the clinic.   BELOW ARE SYMPTOMS THAT SHOULD BE REPORTED IMMEDIATELY:  *FEVER GREATER THAN 100.5 F  *CHILLS WITH OR WITHOUT FEVER  NAUSEA AND VOMITING THAT IS NOT CONTROLLED WITH YOUR NAUSEA MEDICATION  *UNUSUAL SHORTNESS OF BREATH  *UNUSUAL BRUISING OR BLEEDING  TENDERNESS IN MOUTH AND THROAT WITH OR WITHOUT PRESENCE OF ULCERS  *URINARY PROBLEMS  *BOWEL PROBLEMS  UNUSUAL RASH Items with * indicate a potential emergency and should be followed up as soon as possible.  Feel free to call the clinic you have any questions or concerns. The clinic phone number is [phone].  Please show the CHEMO ALERT CARD at check-in to the Emergency Department and triage nurse.   

## 2016-06-04 NOTE — Progress Notes (Signed)
Patient states she has had a temperature of 99 since Monday. Patient states she has had a cyst on her posterior right upper thigh for years. However, this past Saturday she noticed the cyst to be red, swollen and painful. Dr. Burr Medico chairside.    Ok to treat per Dr. Burr Medico.

## 2016-06-05 ENCOUNTER — Ambulatory Visit (HOSPITAL_BASED_OUTPATIENT_CLINIC_OR_DEPARTMENT_OTHER): Payer: 59

## 2016-06-05 VITALS — BP 126/80 | HR 84 | Temp 98.4°F | Resp 18

## 2016-06-05 DIAGNOSIS — C50412 Malignant neoplasm of upper-outer quadrant of left female breast: Secondary | ICD-10-CM

## 2016-06-05 DIAGNOSIS — Z5189 Encounter for other specified aftercare: Secondary | ICD-10-CM

## 2016-06-05 DIAGNOSIS — L723 Sebaceous cyst: Secondary | ICD-10-CM | POA: Diagnosis not present

## 2016-06-05 DIAGNOSIS — Z171 Estrogen receptor negative status [ER-]: Secondary | ICD-10-CM

## 2016-06-05 DIAGNOSIS — L089 Local infection of the skin and subcutaneous tissue, unspecified: Secondary | ICD-10-CM | POA: Diagnosis not present

## 2016-06-05 MED ORDER — TBO-FILGRASTIM 480 MCG/0.8ML ~~LOC~~ SOSY
480.0000 ug | PREFILLED_SYRINGE | Freq: Once | SUBCUTANEOUS | Status: AC
  Administered 2016-06-05: 480 ug via SUBCUTANEOUS
  Filled 2016-06-05: qty 0.8

## 2016-06-07 ENCOUNTER — Encounter: Payer: Self-pay | Admitting: Hematology

## 2016-06-11 ENCOUNTER — Other Ambulatory Visit: Payer: 59

## 2016-06-11 ENCOUNTER — Ambulatory Visit: Payer: 59

## 2016-06-12 ENCOUNTER — Ambulatory Visit: Payer: 59

## 2016-06-16 NOTE — Progress Notes (Signed)
[place]  Telephone:([phone] Fax:([phone]) [phone]  Clinic Follow up Note   Patient Care Team: Maisie Fus, MD as PCP - General (Obstetrics and Gynecology) Collene Gobble, MD as Attending Physician (Psychiatry) Rolm Bookbinder, MD as Consulting Physician (General Surgery) Truitt Merle, MD as Consulting Physician (Hematology) Kyung Rudd, MD as Consulting Physician (Radiation Oncology) 06/18/2016   CHIEF COMPLAINTS:  Follow up left breast triple negative cancer  Oncology History   Malignant neoplasm of upper-outer quadrant of left female breast [facility])   Staging form: Breast, AJCC 7th Edition   - Clinical stage from 01/29/2016: Stage IIB (T2, N1, M0) - Signed by Truitt Merle, MD on 02/06/2016      Malignant neoplasm of upper-outer quadrant of left female breast ([place])   01/28/2016 Mammogram    Diagnostic MM and US showed a 2.9cm (3.2X2.2X2.5cm on [place]) mass in [place], multiple enlarged left axillary nodes, largest 2.5cm.       01/29/2016 Initial Diagnosis    Malignant neoplasm of upper-outer quadrant of left female breast ([place])      01/29/2016 Initial Biopsy    Left breast mass and axillary node biopsy showed IDC, G3,       01/29/2016 Receptors her2    ER-, PR-, HER2-, Ki67 85%      02/11/2016 Imaging    Bilateral breast MRI with and without contrast showed a 3.3 x 2.6 x 2.6 cm mass in the upper outer left breast, and left axillary lymphadenopathy, at least 3 abnormal lymph nodes, no other additional sites of concern.      02/12/2016 Imaging    CT chest, abdomen and pelvis with contrast showed a 2.4 x 2.7 cm lesion in the upper outer left breast, left axillary node metastasis measuring up to 1.3 cm, no evidence of distant metastasis.      02/12/2016 Imaging    Bone scan was negative for skeletal metastasis.      02/13/2016 -  Neo-Adjuvant Chemotherapy    Dose dense Adriamycin 60 mg/m, Cytoxan 600 mg/m, every 2 weeks, for 4 cycles, followed by weekly  carboplatin and Taxol for 12 weeks       03/27/2016 Genetic Testing    Patient has genetic testing done for personal history of breast cancer, family history of cancer. ATM c.2606C>T VUS identified on the Breast/GYN panel.  Negative genetic testing for the MSH2 inversion analysis (Boland inversion). The Breast/GYN gene panel offered by [facility] includes sequencing and rearrangement analysis for the following 23 genes:  ATM, BARD1, BRCA1, BRCA2, BRIP1, CDH1, CHEK2, EPCAM, FANCC, MLH1, MSH2, MSH6, MUTYH, NBN, NF1, PALB2, PMS2, POLD1, PTEN, RAD51C, RAD51D, RECQL, and TP53.         HISTORY OF PRESENTING ILLNESS (02/06/16) :  Grace Moyer 56 y.o. female is here because of her recently diagnosed left breast cancer. She was accompanied By her husband to our multidisciplinary breast clinic today.  This was discovered by screening mammogram, she had no palpable breast mass or axilla note. She denies any other new symptoms. She underwent a diagnostic mammogram and ultrasound on 01/28/2016, which showed a 2.9 cm mass in the upper outer quadrant, and multiple enlarged left axillary nodes, largest 2.5 cm. She underwent core needle biopsy of the left breast mass and left axillary node, both showed invasive ductal carcinoma, grade 3, triple negative, Ki-67 85%.  She developed bilateral numbness and tingling of her legs and hands at the end of May 2017. She underwent a multiple scans, and lab test, and was  finally seen by neurologist Dr. Moshe Cipro, and was diagnosed with neuromyelitis optica spectrum disorder (NOSD). She initially received high-dose steroids, and then started Rituxan infusion, status post 2 infusions, now is on every four-month schedule, next due in Jan 2018. She states her neuropathy has been stable, she denies any significant vision problem, or other neurological symptoms. Her hand function and gait are normal. She is an Clinical biochemist.   GYN HISTORY  Menarchal: 13 LMP: 25  (hysterectomy) Contraceptive: 3 years  HRT: n/a  G1P1: 23 yo daughter   CURRENT THERAPY: Neoadjuvant dose dense Adriamycin 60 mg/m, Cytoxan 600 mg/m, every 2 weeks, for 4 cycles, followed by weekly carboplatin and Taxol for 12 weeks started 02/13/2016  INTERIM HISTORY Grace Moyer returns for follow-up and 10th cycle carbo and taxol. We held chemotherapy last week due to her recent sebaceous cyst infection At the right buttock, and mildly worsening of her neuropathy. She overall feels better after one week break off chemotherapy. Her neuropathy is back to her baseline. The infection has resolved. No other new complaints.   MEDICAL HISTORY:  Past Medical History:  Diagnosis Date  . Anxiety   . Family history of breast cancer   . Family history of colon cancer   . Malignant neoplasm of upper-outer quadrant of left female breast ([place]) 01/31/2016  . Neuromyelitis optica ([place])     SURGICAL HISTORY: Past Surgical History:  Procedure Laterality Date  . ABDOMINAL HYSTERECTOMY     partial  . EYE SURGERY    . PORTACATH PLACEMENT Right 02/11/2016   Procedure: INSERTION PORT-A-CATH WITH [place];  Surgeon: Rolm Bookbinder, MD;  Location: [place];  Service: General;  Laterality: Right;    SOCIAL HISTORY: Social History   Social History  . Marital status: Married    Spouse name: N/A  . Number of children: N/A  . Years of education: N/A   Occupational History  . Not on file.   Social History Main Topics  . Smoking status: Former Smoker    Types: Cigarettes    Quit date: 09/03/2001  . Smokeless tobacco: Never Used  . Alcohol use No  . Drug use: No  . Sexual activity: Not on file   Other Topics Concern  . Not on file   Social History Narrative  . No narrative on file    FAMILY HISTORY: Family History  Problem Relation Age of Onset  . Breast cancer Maternal Grandmother     dx in her 58s  . CAD Paternal Grandmother   . Colon cancer Paternal Uncle     dx in  his 7s  . COPD Maternal Grandfather   . CAD Paternal Grandfather   . Breast cancer Cousin     dx in her early to mid 20s; maternal first cousin  . Lung cancer Paternal Uncle     ALLERGIES:  has No Known Allergies.  MEDICATIONS:  Current Outpatient Prescriptions  Medication Sig Dispense Refill  . Cholecalciferol (VITAMIN D-1000 MAX ST) 1000 units tablet Take 2 tablets by mouth daily.    Marland Kitchen gabapentin (NEURONTIN) 100 MG capsule     . lidocaine-prilocaine (EMLA) cream Apply to affected area once 30 g 3  . omeprazole (PRILOSEC) 40 MG capsule Take 40 mg by mouth daily.    . ondansetron (ZOFRAN) 8 MG tablet Take 1 tablet (8 mg total) by mouth 2 (two) times daily as needed. Start on the third day after chemotherapy. 30 tablet 1  . prochlorperazine (COMPAZINE) 10 MG tablet Take 1  tablet (10 mg total) by mouth every 6 (six) hours as needed (Nausea or vomiting). 30 tablet 1  . riTUXimab (RITUXAN) 100 MG/10ML injection Inject into the vein every 4 (four) months.    Marland Kitchen HYDROcodone-acetaminophen (NORCO) 10-325 MG tablet Take 1 tablet by mouth every 6 (six) hours as needed. (Patient not taking: Reported on 06/18/2016) 15 tablet 0   No current facility-administered medications for this visit.    Facility-Administered Medications Ordered in Other Visits  Medication Dose Route Frequency Provider Last Rate Last Dose  . sodium chloride flush (NS) 0.9 % injection 10 mL  10 mL Intracatheter PRN Truitt Merle, MD   10 mL at 06/18/16 1540    REVIEW OF SYSTEMS:   Constitutional: Denies fevers, chills or abnormal night sweats (+) fatigue Eyes: Denies blurriness of vision, double vision or watery eyes Ears, nose, mouth, throat, and face: Denies mucositis or sore throat Respiratory: Denies cough, dyspnea or wheezes Cardiovascular: Denies palpitation, chest discomfort or lower extremity swelling Gastrointestinal:  Denies nausea, heartburn Skin: Denies abnormal skin rashes Lymphatics: Denies new lymphadenopathy or  easy bruising Neurological:Denies new weaknesses (+) tingling/numbness, neuropathy stable Behavioral/Psych: (+) anxiety about cancer recurrance All other systems were reviewed with the patient and are negative.  PHYSICAL EXAMINATION: ECOG PERFORMANCE STATUS: 1 - Symptomatic but completely ambulatory BP (!) 127/59 (BP Location: Left Arm, Patient Position: Sitting)   Pulse 70   Temp 99 F (37.2 C) (Oral)   Resp 18   Ht 5' 3"  (1.6 m)   Wt 193 lb (87.5 kg)   SpO2 100%   BMI 34.19 kg/m   GENERAL:alert, no distress and comfortable.  SKIN: skin color, texture, turgor are normal, no rashes, the previously infected sebaceous cyst in the right buttock has resolved and healed well. No skin erythema. EYES: normal, conjunctiva are pink and non-injected, sclera clear OROPHARYNX:no exudate, no erythema and lips, buccal mucosa, and tongue normal  NECK: supple, thyroid normal size, non-tender, without nodularity. (+) Incision in the right lower neck for port. Port appears clean, neck not significantly swollen, no erythema, no sign of infection. LYMPH:  no palpable lymphadenopathy in the cervical, axillary or inguinal LUNGS: clear to auscultation and percussion with normal breathing effort HEART: regular rate & rhythm and no murmurs and no lower extremity edema ABDOMEN:abdomen soft, non-tender and normal bowel sounds MUSCULOSKELETALl:no cyanosis of digits and no clubbing  PSYCH: alert & oriented x 3 with fluent speech NEURO: no focal motor/sensory deficits Breasts: Breast inspection showed them to be symmetrical with no nipple discharge. Palpation of the breasts and axilla revealed no obvious mass that I could appreciate, there is fullness at the upper-outter quadrant of left breast.    LABORATORY DATA:  I have reviewed the data as listed CBC Latest Ref Rng & Units 06/18/2016 06/04/2016 05/28/2016  WBC 3.9 - 10.3 10e3/uL 2.9(L) 2.7(L) 1.3(L)  Hemoglobin 11.6 - 15.9 g/dL 10.7(L) 9.7(L) 9.2(L)   Hematocrit 34.8 - 46.6 % 32.0(L) 28.2(L) 26.4(L)  Platelets 145 - 400 10e3/uL 196 213 213   CMP Latest Ref Rng & Units 06/18/2016 06/04/2016 05/28/2016  Glucose 70 - 140 mg/dl 93 98 95  BUN 7.0 - 26.0 mg/dL 10.1 7.8 9.0  Creatinine 0.6 - 1.1 mg/dL 0.6 0.7 0.7  Sodium 136 - 145 mEq/L 141 139 140  Potassium 3.5 - 5.1 mEq/L 4.0 3.9 4.0  Chloride 101 - 111 mmol/L - - -  CO2 22 - 29 mEq/L 22 22 22   Calcium 8.4 - 10.4 mg/dL 9.1 9.3 9.2  Total Protein 6.4 - 8.3 g/dL 6.2(L) 6.3(L) 6.2(L)  Total Bilirubin 0.20 - 1.20 mg/dL 0.81 0.98 0.86  Alkaline Phos 40 - 150 U/L 91 99 97  AST 5 - 34 U/L 40(H) 36(H) 39(H)  ALT 0 - 55 U/L 53 58(H) 64(H)   ANC 1.8  PATHOLOGY REPORT  Diagnosis 01/29/2016 1. Breast, left, needle core biopsy, 1:30 o'clock - INVASIVE DUCTAL CARCINOMA, GRADE 3. - LYMPHOVASCULAR INVOLVEMENT BY TUMOR. 2. Lymph node, needle/core biopsy, left axillary - METASTATIC CARCINOMA.  RADIOGRAPHIC STUDIES: I have personally reviewed the radiological images as listed and agreed with the findings in the report. No results found.  Bone scan 02/12/2016 IMPRESSION: No evidence of skeletal metastatic disease.  CT chest abdomen pelvis w contrast 02/12/2016 IMPRESSION: 2.4 x 2.7 cm lesion in the upper outer left breast, corresponding to the patient's known breast neoplasm. Left axillary nodal metastasis measuring up to 13 mm short axis. No evidence of distant metastasis.  ASSESSMENT & PLAN: 56 y.o. Caucasian woman with past medical history of depression, presented with a screening mammogram discovered left breast cancer   1. Malignant neoplasm of upper-outer quadrant of left breast, invasive ductal carcinoma, G3, cT2N1M0, stage IIB, ER-/PR-/HER2- -I previously reviewed her imaging findings and the biopsy results in great detail with patient and her husband. -I previously discussed her breast MRI, CT scan and bone scan findings, which all confirmed the left breast lesion and axillary  adenopathy, no other distant metastasis. -she was previously seen by Dr. Donne Hazel and surgical options were discussed  -We previously discussed that triple negative breast cancers are much more aggressive, and her risk of cancer recurrence after breast surgery is pretty high, giving the multiple positive lymph nodes. I recommend her to consider neoadjuvant chemotherapy, to reduce her risk of recurrence, and downstage her breast cancer, to make lumpectomy and sentinel lymph node biopsy as a more feasible surgery. -I previously recommended her to have dose dense Adriamycin, Cytoxan every 2 weeks, with Neulasta support, for 4 cycles, followed by weekly carboplatin and Taxol for 12 weeks  -the goal of therapy is curative  -Due to her underlying NOSD, and existing peripheral neuropathy, my main concern of long-term side effects from chemotherapy is worsening neuropathy, especially from Taxol, we will monitor closely, and may need to hold it if her neuropathy gets worse.  -Her baseline echocardiogram was normal. -Patient is tolerating chemotherapy well, left breast mass is not palpable anymore, she is clinically responding to chemotherapy, we'll continue chemotherapy. -Lab reviewed. Mild anemia, transaminitis has improved,  we proceeded with 10th week carbo and Taxol  -neutropenia resolved with Granix, will continue on day 2 after chemo  - Repeat breast MRI 2 weeks following completion of chemotherapy. Surgery will be scheduled in May/June with Dr.Wakefield. Patient is leaning towards bilateral mastectomy. I encouraged her to discuss with Dr. Donne Hazel.  2. Right low buttock infected sebaceous cyst -she had I&D and a course of antibiotics  -resolved now   3. Genetics -Given her young age and triple negative disease, we recommend her to have genetic testing to rule out inheritable breast cancer syndrome, and the test result may impact her surgery. -ATM c.2606C>T VUS identified on the Breast/Gyn panel.   Negative genetic testing for the MSH2 inversion analysis (Boland inversion). The Breast/GYN gene panel offered by [facility] includes sequencing and rearrangement analysis for the following 23 genes:  ATM, BARD1, BRCA1, BRCA2, BRIP1, CDH1, CHEK2, EPCAM, FANCC, MLH1, MSH2, MSH6, MUTYH, NBN, NF1, PALB2, PMS2, POLD1, PTEN, RAD51C, RAD51D, RECQL,  and TP53.   The report date is March 27, 2016  4. neuromyelitis optica spectrum disorder (NOSD) -she will continue follow up with her neurologist Dr[facility] Moshe [facility]ipro at [facility]  -she is on rituximab every 4 weeks. It will be OK to continue her Rituximab when she is on chemotherapy, but the combination therapy will further compromise her immune system. -I have spoken with Dr. Moshe Cipro about her breast cancer treatment, he is agreeable. -She previously received Rituxan in our cancer center on 03/21/2016, tolerated generally well. She prefers to get next dose Rituxan here with [place] in May, I will arrange.  5. Anemia secondary to chemo -overall mild and stable -We'll monitor closely  Plan -week 10 carbo and taxol, continue weekly, she has two more treatments  -breast MRI in 3 weeks  -will copy Dr. Donne Hazel, patient will discuss mastectomy versus lumpectomy with Dr. Donne Hazel after she completes chemo  -I'll schedule her next dose Rituxan on May 11 -I'll see her back in 2 weeks   No orders of the defined types were placed in this encounter.   All questions were answered. The patient knows to call the clinic with any problems, questions or concerns.  I spent 20 minutes counseling the patient face to face. The total time spent in the appointment was 25 minutes and more than 50% was on counseling.     Truitt Merle, MD 06/18/2016

## 2016-06-18 ENCOUNTER — Ambulatory Visit: Payer: 59

## 2016-06-18 ENCOUNTER — Ambulatory Visit (HOSPITAL_BASED_OUTPATIENT_CLINIC_OR_DEPARTMENT_OTHER): Payer: 59 | Admitting: Hematology

## 2016-06-18 ENCOUNTER — Telehealth: Payer: Self-pay | Admitting: Hematology

## 2016-06-18 ENCOUNTER — Ambulatory Visit (HOSPITAL_BASED_OUTPATIENT_CLINIC_OR_DEPARTMENT_OTHER): Payer: 59

## 2016-06-18 ENCOUNTER — Other Ambulatory Visit (HOSPITAL_BASED_OUTPATIENT_CLINIC_OR_DEPARTMENT_OTHER): Payer: 59

## 2016-06-18 ENCOUNTER — Encounter: Payer: Self-pay | Admitting: Hematology

## 2016-06-18 VITALS — BP 127/59 | HR 70 | Temp 99.0°F | Resp 18 | Ht 63.0 in | Wt 193.0 lb

## 2016-06-18 DIAGNOSIS — C50412 Malignant neoplasm of upper-outer quadrant of left female breast: Secondary | ICD-10-CM

## 2016-06-18 DIAGNOSIS — D6481 Anemia due to antineoplastic chemotherapy: Secondary | ICD-10-CM

## 2016-06-18 DIAGNOSIS — G360 Neuromyelitis optica [Devic]: Secondary | ICD-10-CM

## 2016-06-18 DIAGNOSIS — Z95828 Presence of other vascular implants and grafts: Secondary | ICD-10-CM

## 2016-06-18 DIAGNOSIS — Z171 Estrogen receptor negative status [ER-]: Secondary | ICD-10-CM

## 2016-06-18 DIAGNOSIS — Z5111 Encounter for antineoplastic chemotherapy: Secondary | ICD-10-CM | POA: Diagnosis not present

## 2016-06-18 LAB — CBC WITH DIFFERENTIAL/PLATELET
BASO%: 1.1 % (ref 0.0–2.0)
Basophils Absolute: 0 10*3/uL (ref 0.0–0.1)
EOS%: 1.4 % (ref 0.0–7.0)
Eosinophils Absolute: 0 10*3/uL (ref 0.0–0.5)
HCT: 32 % — ABNORMAL LOW (ref 34.8–46.6)
HGB: 10.7 g/dL — ABNORMAL LOW (ref 11.6–15.9)
LYMPH%: 21.8 % (ref 14.0–49.7)
MCH: 33.6 pg (ref 25.1–34.0)
MCHC: 33.4 g/dL (ref 31.5–36.0)
MCV: 100.6 fL (ref 79.5–101.0)
MONO#: 0.4 10*3/uL (ref 0.1–0.9)
MONO%: 13.3 % (ref 0.0–14.0)
NEUT#: 1.8 10*3/uL (ref 1.5–6.5)
NEUT%: 62.4 % (ref 38.4–76.8)
Platelets: 196 10*3/uL (ref 145–400)
RBC: 3.18 10*6/uL — ABNORMAL LOW (ref 3.70–5.45)
RDW: 15.6 % — ABNORMAL HIGH (ref 11.2–14.5)
WBC: 2.9 10*3/uL — ABNORMAL LOW (ref 3.9–10.3)
lymph#: 0.6 10*3/uL — ABNORMAL LOW (ref 0.9–3.3)

## 2016-06-18 LAB — COMPREHENSIVE METABOLIC PANEL
ALT: 53 U/L (ref 0–55)
AST: 40 U/L — ABNORMAL HIGH (ref 5–34)
Albumin: 3.8 g/dL (ref 3.5–5.0)
Alkaline Phosphatase: 91 U/L (ref 40–150)
Anion Gap: 10 mEq/L (ref 3–11)
BUN: 10.1 mg/dL (ref 7.0–26.0)
CO2: 22 mEq/L (ref 22–29)
Calcium: 9.1 mg/dL (ref 8.4–10.4)
Chloride: 109 mEq/L (ref 98–109)
Creatinine: 0.6 mg/dL (ref 0.6–1.1)
EGFR: >90 mL/min/{1.73_m2} (ref >90)
Glucose: 93 mg/dl (ref 70–140)
Potassium: 4 mEq/L (ref 3.5–5.1)
Sodium: 141 mEq/L (ref 136–145)
Total Bilirubin: 0.81 mg/dL (ref 0.20–1.20)
Total Protein: 6.2 g/dL — ABNORMAL LOW (ref 6.4–8.3)

## 2016-06-18 MED ORDER — SODIUM CHLORIDE 0.9 % IV SOLN
Freq: Once | INTRAVENOUS | Status: AC
  Administered 2016-06-18: 13:00:00 via INTRAVENOUS

## 2016-06-18 MED ORDER — DEXAMETHASONE SODIUM PHOSPHATE 10 MG/ML IJ SOLN
10.0000 mg | Freq: Once | INTRAMUSCULAR | Status: AC
  Administered 2016-06-18: 10 mg via INTRAVENOUS

## 2016-06-18 MED ORDER — FAMOTIDINE IN NACL 20-0.9 MG/50ML-% IV SOLN
20.0000 mg | Freq: Once | INTRAVENOUS | Status: AC
  Administered 2016-06-18: 20 mg via INTRAVENOUS

## 2016-06-18 MED ORDER — HEPARIN SOD (PORK) LOCK FLUSH 100 UNIT/ML IV SOLN
500.0000 [IU] | Freq: Once | INTRAVENOUS | Status: AC | PRN
  Administered 2016-06-18: 500 [IU]
  Filled 2016-06-18: qty 5

## 2016-06-18 MED ORDER — PACLITAXEL CHEMO INJECTION 300 MG/50ML
80.0000 mg/m2 | Freq: Once | INTRAVENOUS | Status: AC
  Administered 2016-06-18: 162 mg via INTRAVENOUS
  Filled 2016-06-18: qty 27

## 2016-06-18 MED ORDER — FAMOTIDINE IN NACL 20-0.9 MG/50ML-% IV SOLN
INTRAVENOUS | Status: AC
  Filled 2016-06-18: qty 50

## 2016-06-18 MED ORDER — SODIUM CHLORIDE 0.9 % IV SOLN
286.4000 mg | Freq: Once | INTRAVENOUS | Status: AC
  Administered 2016-06-18: 290 mg via INTRAVENOUS
  Filled 2016-06-18: qty 29

## 2016-06-18 MED ORDER — SODIUM CHLORIDE 0.9% FLUSH
10.0000 mL | INTRAVENOUS | Status: AC | PRN
  Administered 2016-06-18: 10 mL
  Filled 2016-06-18: qty 10

## 2016-06-18 MED ORDER — DEXAMETHASONE SODIUM PHOSPHATE 10 MG/ML IJ SOLN
INTRAMUSCULAR | Status: AC
  Filled 2016-06-18: qty 1

## 2016-06-18 MED ORDER — DIPHENHYDRAMINE HCL 50 MG/ML IJ SOLN
50.0000 mg | Freq: Once | INTRAMUSCULAR | Status: AC
  Administered 2016-06-18: 50 mg via INTRAVENOUS

## 2016-06-18 MED ORDER — DIPHENHYDRAMINE HCL 50 MG/ML IJ SOLN
INTRAMUSCULAR | Status: AC
  Filled 2016-06-18: qty 1

## 2016-06-18 MED ORDER — SODIUM CHLORIDE 0.9% FLUSH
10.0000 mL | INTRAVENOUS | Status: DC | PRN
  Administered 2016-06-18: 10 mL via INTRAVENOUS
  Filled 2016-06-18: qty 10

## 2016-06-18 NOTE — Telephone Encounter (Signed)
Appointments scheduled per 4.18.18 LOS. Patient given AVS report and calendars with future scheduled appointments. °

## 2016-06-18 NOTE — Addendum Note (Signed)
Addended by: Truitt Merle on: 06/18/2016 11:08 PM   Modules accepted: Orders

## 2016-06-18 NOTE — Patient Instructions (Signed)
[facility] Discharge Instructions for Patients Receiving Chemotherapy  Today you received the following chemotherapy agents Taxol/Carboplatin To help prevent nausea and vomiting after your treatment, we encourage you to take your nausea medication as prescribed.   If you develop nausea and vomiting that is not controlled by your nausea medication, call the clinic.   BELOW ARE SYMPTOMS THAT SHOULD BE REPORTED IMMEDIATELY:  *FEVER GREATER THAN 100.5 F  *CHILLS WITH OR WITHOUT FEVER  NAUSEA AND VOMITING THAT IS NOT CONTROLLED WITH YOUR NAUSEA MEDICATION  *UNUSUAL SHORTNESS OF BREATH  *UNUSUAL BRUISING OR BLEEDING  TENDERNESS IN MOUTH AND THROAT WITH OR WITHOUT PRESENCE OF ULCERS  *URINARY PROBLEMS  *BOWEL PROBLEMS  UNUSUAL RASH Items with * indicate a potential emergency and should be followed up as soon as possible.  Feel free to call the clinic you have any questions or concerns. The clinic phone number is [phone].  Please show the CHEMO ALERT CARD at check-in to the Emergency Department and triage nurse.   

## 2016-06-19 ENCOUNTER — Ambulatory Visit (HOSPITAL_BASED_OUTPATIENT_CLINIC_OR_DEPARTMENT_OTHER): Payer: 59

## 2016-06-19 ENCOUNTER — Telehealth: Payer: Self-pay | Admitting: *Deleted

## 2016-06-19 VITALS — BP 114/67 | HR 102 | Temp 98.6°F | Resp 20

## 2016-06-19 DIAGNOSIS — Z5189 Encounter for other specified aftercare: Secondary | ICD-10-CM | POA: Diagnosis not present

## 2016-06-19 DIAGNOSIS — Z95828 Presence of other vascular implants and grafts: Secondary | ICD-10-CM

## 2016-06-19 DIAGNOSIS — Z171 Estrogen receptor negative status [ER-]: Secondary | ICD-10-CM

## 2016-06-19 DIAGNOSIS — C50412 Malignant neoplasm of upper-outer quadrant of left female breast: Secondary | ICD-10-CM | POA: Diagnosis not present

## 2016-06-19 MED ORDER — SODIUM CHLORIDE 0.9% FLUSH
10.0000 mL | INTRAVENOUS | Status: AC | PRN
  Filled 2016-06-19: qty 10

## 2016-06-19 MED ORDER — TBO-FILGRASTIM 480 MCG/0.8ML ~~LOC~~ SOSY
480.0000 ug | PREFILLED_SYRINGE | Freq: Once | SUBCUTANEOUS | Status: AC
  Administered 2016-06-19: 480 ug via SUBCUTANEOUS
  Filled 2016-06-19: qty 0.8

## 2016-06-19 MED ORDER — HEPARIN SOD (PORK) LOCK FLUSH 100 UNIT/ML IV SOLN
500.0000 [IU] | Freq: Once | INTRAVENOUS | Status: AC | PRN
  Filled 2016-06-19: qty 5

## 2016-06-19 NOTE — Patient Instructions (Signed)
Tbo-Filgrastim injection What is this medicine? TBO-FILGRASTIM (T B O fil GRA stim) is a granulocyte colony-stimulating factor that stimulates the growth of neutrophils, a type of white blood cell important in the body's fight against infection. It is used to reduce the incidence of fever and infection in patients with certain types of cancer who are receiving chemotherapy that affects the bone marrow. This medicine may be used for other purposes; ask your health care provider or pharmacist if you have questions. COMMON BRAND NAME(S): Granix What should I tell my health care provider before I take this medicine? They need to know if you have any of these conditions: -bone scan or tests planned -kidney disease -sickle cell anemia -an unusual or allergic reaction to tbo-filgrastim, filgrastim, pegfilgrastim, other medicines, foods, dyes, or preservatives -pregnant or trying to get pregnant -breast-feeding How should I use this medicine? This medicine is for injection under the skin. If you get this medicine at home, you will be taught how to prepare and give this medicine. Refer to the Instructions for Use that come with your medication packaging. Use exactly as directed. Take your medicine at regular intervals. Do not take your medicine more often than directed. It is important that you put your used needles and syringes in a special sharps container. Do not put them in a trash can. If you do not have a sharps container, call your pharmacist or healthcare provider to get one. Talk to your pediatrician regarding the use of this medicine in children. Special care may be needed. Overdosage: If you think you have taken too much of this medicine contact a poison control center or emergency room at once. NOTE: This medicine is only for you. Do not share this medicine with others. What if I miss a dose? It is important not to miss your dose. Call your doctor or health care professional if you miss a  dose. What may interact with this medicine? This medicine may interact with the following medications: -medicines that may cause a release of neutrophils, such as lithium This list may not describe all possible interactions. Give your health care provider a list of all the medicines, herbs, non-prescription drugs, or dietary supplements you use. Also tell them if you smoke, drink alcohol, or use illegal drugs. Some items may interact with your medicine. What should I watch for while using this medicine? You may need blood work done while you are taking this medicine. What side effects may I notice from receiving this medicine? Side effects that you should report to your doctor or health care professional as soon as possible: -allergic reactions like skin rash, itching or hives, swelling of the face, lips, or tongue -blood in the urine -dark urine -dizziness -fast heartbeat -feeling faint -shortness of breath or breathing problems -signs and symptoms of infection like fever or chills; cough; or sore throat -signs and symptoms of kidney injury like trouble passing urine or change in the amount of urine -stomach or side pain, or pain at the shoulder -sweating -swelling of the legs, ankles, or abdomen -tiredness Side effects that usually do not require medical attention (report to your doctor or health care professional if they continue or are bothersome): -bone pain -headache -muscle pain -vomiting This list may not describe all possible side effects. Call your doctor for medical advice about side effects. You may report side effects to FDA at 1-800-FDA-1088. Where should I keep my medicine? Keep out of the reach of children. Store in a refrigerator between   2 and 8 degrees C (36 and 46 degrees F). Keep in carton to protect from light. Throw away this medicine if it is left out of the refrigerator for more than 5 consecutive days. Throw away any unused medicine after the expiration  date. NOTE: This sheet is a summary. It may not cover all possible information. If you have questions about this medicine, talk to your doctor, pharmacist, or health care provider.  2018 Elsevier/Gold Standard (2015-04-09 19:07:04)  

## 2016-06-19 NOTE — Telephone Encounter (Signed)
Scheduled and confirmed breast MRI for 5.11.18. Informed pt she will receive a call from Dr. Cristal Generous office with an appt to discuss surgery. Received verbal understanding.

## 2016-06-25 ENCOUNTER — Ambulatory Visit: Payer: 59

## 2016-06-25 ENCOUNTER — Other Ambulatory Visit (HOSPITAL_BASED_OUTPATIENT_CLINIC_OR_DEPARTMENT_OTHER): Payer: 59

## 2016-06-25 ENCOUNTER — Ambulatory Visit (HOSPITAL_BASED_OUTPATIENT_CLINIC_OR_DEPARTMENT_OTHER): Payer: 59

## 2016-06-25 VITALS — BP 116/72 | HR 72 | Temp 98.7°F | Resp 18

## 2016-06-25 DIAGNOSIS — Z171 Estrogen receptor negative status [ER-]: Secondary | ICD-10-CM

## 2016-06-25 DIAGNOSIS — Z5111 Encounter for antineoplastic chemotherapy: Secondary | ICD-10-CM | POA: Diagnosis not present

## 2016-06-25 DIAGNOSIS — Z95828 Presence of other vascular implants and grafts: Secondary | ICD-10-CM

## 2016-06-25 DIAGNOSIS — C50412 Malignant neoplasm of upper-outer quadrant of left female breast: Secondary | ICD-10-CM

## 2016-06-25 LAB — CBC WITH DIFFERENTIAL/PLATELET
BASO%: 1.9 % (ref 0.0–2.0)
Basophils Absolute: 0.1 10*3/uL (ref 0.0–0.1)
EOS%: 1.7 % (ref 0.0–7.0)
Eosinophils Absolute: 0.1 10*3/uL (ref 0.0–0.5)
HCT: 33.7 % — ABNORMAL LOW (ref 34.8–46.6)
HGB: 11.5 g/dL — ABNORMAL LOW (ref 11.6–15.9)
LYMPH%: 21.2 % (ref 14.0–49.7)
MCH: 34 pg (ref 25.1–34.0)
MCHC: 34 g/dL (ref 31.5–36.0)
MCV: 99.9 fL (ref 79.5–101.0)
MONO#: 0.5 10*3/uL (ref 0.1–0.9)
MONO%: 13.5 % (ref 0.0–14.0)
NEUT#: 2.2 10*3/uL (ref 1.5–6.5)
NEUT%: 61.7 % (ref 38.4–76.8)
Platelets: 202 10*3/uL (ref 145–400)
RBC: 3.37 10*6/uL — ABNORMAL LOW (ref 3.70–5.45)
RDW: 15.7 % — ABNORMAL HIGH (ref 11.2–14.5)
WBC: 3.5 10*3/uL — ABNORMAL LOW (ref 3.9–10.3)
lymph#: 0.7 10*3/uL — ABNORMAL LOW (ref 0.9–3.3)

## 2016-06-25 LAB — COMPREHENSIVE METABOLIC PANEL
ALT: 43 U/L (ref 0–55)
AST: 29 U/L (ref 5–34)
Albumin: 4 g/dL (ref 3.5–5.0)
Alkaline Phosphatase: 88 U/L (ref 40–150)
Anion Gap: 10 mEq/L (ref 3–11)
BUN: 8 mg/dL (ref 7.0–26.0)
CO2: 22 mEq/L (ref 22–29)
Calcium: 9.2 mg/dL (ref 8.4–10.4)
Chloride: 109 mEq/L (ref 98–109)
Creatinine: 0.7 mg/dL (ref 0.6–1.1)
EGFR: >90 mL/min/{1.73_m2} (ref >90)
Glucose: 94 mg/dl (ref 70–140)
Potassium: 4.1 mEq/L (ref 3.5–5.1)
Sodium: 141 mEq/L (ref 136–145)
Total Bilirubin: 0.7 mg/dL (ref 0.20–1.20)
Total Protein: 6.3 g/dL — ABNORMAL LOW (ref 6.4–8.3)

## 2016-06-25 MED ORDER — SODIUM CHLORIDE 0.9 % IV SOLN
Freq: Once | INTRAVENOUS | Status: AC
  Administered 2016-06-25: 12:00:00 via INTRAVENOUS

## 2016-06-25 MED ORDER — SODIUM CHLORIDE 0.9% FLUSH
10.0000 mL | INTRAVENOUS | Status: AC | PRN
  Administered 2016-06-25: 10 mL via INTRAVENOUS
  Filled 2016-06-25: qty 10

## 2016-06-25 MED ORDER — DIPHENHYDRAMINE HCL 50 MG/ML IJ SOLN
50.0000 mg | Freq: Once | INTRAMUSCULAR | Status: AC
  Administered 2016-06-25: 50 mg via INTRAVENOUS

## 2016-06-25 MED ORDER — SODIUM CHLORIDE 0.9 % IV SOLN
286.4000 mg | Freq: Once | INTRAVENOUS | Status: AC
  Administered 2016-06-25: 290 mg via INTRAVENOUS
  Filled 2016-06-25: qty 29

## 2016-06-25 MED ORDER — HEPARIN SOD (PORK) LOCK FLUSH 100 UNIT/ML IV SOLN
500.0000 [IU] | Freq: Once | INTRAVENOUS | Status: AC | PRN
  Administered 2016-06-25: 500 [IU]
  Filled 2016-06-25: qty 5

## 2016-06-25 MED ORDER — FAMOTIDINE IN NACL 20-0.9 MG/50ML-% IV SOLN
20.0000 mg | Freq: Once | INTRAVENOUS | Status: AC
  Administered 2016-06-25: 20 mg via INTRAVENOUS

## 2016-06-25 MED ORDER — SODIUM CHLORIDE 0.9% FLUSH
10.0000 mL | INTRAVENOUS | Status: DC | PRN
  Administered 2016-06-25: 10 mL
  Filled 2016-06-25: qty 10

## 2016-06-25 MED ORDER — DEXAMETHASONE SODIUM PHOSPHATE 10 MG/ML IJ SOLN
INTRAMUSCULAR | Status: AC
  Filled 2016-06-25: qty 1

## 2016-06-25 MED ORDER — DIPHENHYDRAMINE HCL 50 MG/ML IJ SOLN
INTRAMUSCULAR | Status: AC
  Filled 2016-06-25: qty 1

## 2016-06-25 MED ORDER — PACLITAXEL CHEMO INJECTION 300 MG/50ML
80.0000 mg/m2 | Freq: Once | INTRAVENOUS | Status: AC
  Administered 2016-06-25: 162 mg via INTRAVENOUS
  Filled 2016-06-25: qty 27

## 2016-06-25 MED ORDER — FAMOTIDINE IN NACL 20-0.9 MG/50ML-% IV SOLN
INTRAVENOUS | Status: AC
  Filled 2016-06-25: qty 50

## 2016-06-25 MED ORDER — DEXAMETHASONE SODIUM PHOSPHATE 10 MG/ML IJ SOLN
10.0000 mg | Freq: Once | INTRAMUSCULAR | Status: AC
  Administered 2016-06-25: 10 mg via INTRAVENOUS

## 2016-06-25 NOTE — Patient Instructions (Signed)
[facility] Discharge Instructions for Patients Receiving Chemotherapy  Today you received the following chemotherapy agents Taxol/Carboplatin To help prevent nausea and vomiting after your treatment, we encourage you to take your nausea medication as prescribed.   If you develop nausea and vomiting that is not controlled by your nausea medication, call the clinic.   BELOW ARE SYMPTOMS THAT SHOULD BE REPORTED IMMEDIATELY:  *FEVER GREATER THAN 100.5 F  *CHILLS WITH OR WITHOUT FEVER  NAUSEA AND VOMITING THAT IS NOT CONTROLLED WITH YOUR NAUSEA MEDICATION  *UNUSUAL SHORTNESS OF BREATH  *UNUSUAL BRUISING OR BLEEDING  TENDERNESS IN MOUTH AND THROAT WITH OR WITHOUT PRESENCE OF ULCERS  *URINARY PROBLEMS  *BOWEL PROBLEMS  UNUSUAL RASH Items with * indicate a potential emergency and should be followed up as soon as possible.  Feel free to call the clinic you have any questions or concerns. The clinic phone number is [phone].  Please show the CHEMO ALERT CARD at check-in to the Emergency Department and triage nurse.   

## 2016-06-26 ENCOUNTER — Ambulatory Visit (HOSPITAL_BASED_OUTPATIENT_CLINIC_OR_DEPARTMENT_OTHER): Payer: 59

## 2016-06-26 VITALS — BP 122/76 | HR 99 | Temp 99.1°F | Resp 18

## 2016-06-26 DIAGNOSIS — Z171 Estrogen receptor negative status [ER-]: Secondary | ICD-10-CM

## 2016-06-26 DIAGNOSIS — Z5189 Encounter for other specified aftercare: Secondary | ICD-10-CM

## 2016-06-26 DIAGNOSIS — C50412 Malignant neoplasm of upper-outer quadrant of left female breast: Secondary | ICD-10-CM | POA: Diagnosis not present

## 2016-06-26 MED ORDER — TBO-FILGRASTIM 480 MCG/0.8ML ~~LOC~~ SOSY
480.0000 ug | PREFILLED_SYRINGE | Freq: Once | SUBCUTANEOUS | Status: AC
  Administered 2016-06-26: 480 ug via SUBCUTANEOUS
  Filled 2016-06-26: qty 0.8

## 2016-07-01 DIAGNOSIS — C50412 Malignant neoplasm of upper-outer quadrant of left female breast: Secondary | ICD-10-CM | POA: Diagnosis not present

## 2016-07-02 ENCOUNTER — Telehealth: Payer: Self-pay | Admitting: Hematology

## 2016-07-02 ENCOUNTER — Other Ambulatory Visit: Payer: 59

## 2016-07-02 ENCOUNTER — Ambulatory Visit (HOSPITAL_BASED_OUTPATIENT_CLINIC_OR_DEPARTMENT_OTHER): Payer: 59

## 2016-07-02 ENCOUNTER — Ambulatory Visit: Payer: 59

## 2016-07-02 ENCOUNTER — Other Ambulatory Visit (HOSPITAL_BASED_OUTPATIENT_CLINIC_OR_DEPARTMENT_OTHER): Payer: 59

## 2016-07-02 ENCOUNTER — Ambulatory Visit (HOSPITAL_BASED_OUTPATIENT_CLINIC_OR_DEPARTMENT_OTHER): Payer: 59 | Admitting: Hematology

## 2016-07-02 VITALS — BP 117/64 | HR 82 | Temp 98.5°F | Resp 17 | Ht 63.0 in | Wt 188.1 lb

## 2016-07-02 DIAGNOSIS — G360 Neuromyelitis optica [Devic]: Secondary | ICD-10-CM | POA: Diagnosis not present

## 2016-07-02 DIAGNOSIS — Z5111 Encounter for antineoplastic chemotherapy: Secondary | ICD-10-CM | POA: Diagnosis not present

## 2016-07-02 DIAGNOSIS — Z171 Estrogen receptor negative status [ER-]: Secondary | ICD-10-CM

## 2016-07-02 DIAGNOSIS — C50412 Malignant neoplasm of upper-outer quadrant of left female breast: Secondary | ICD-10-CM

## 2016-07-02 DIAGNOSIS — Z95828 Presence of other vascular implants and grafts: Secondary | ICD-10-CM

## 2016-07-02 DIAGNOSIS — C773 Secondary and unspecified malignant neoplasm of axilla and upper limb lymph nodes: Secondary | ICD-10-CM | POA: Diagnosis not present

## 2016-07-02 DIAGNOSIS — D6481 Anemia due to antineoplastic chemotherapy: Secondary | ICD-10-CM

## 2016-07-02 LAB — COMPREHENSIVE METABOLIC PANEL WITH GFR
ALT: 41 U/L (ref 0–55)
AST: 31 U/L (ref 5–34)
Albumin: 4.1 g/dL (ref 3.5–5.0)
Alkaline Phosphatase: 85 U/L (ref 40–150)
Anion Gap: 11 meq/L (ref 3–11)
BUN: 8.3 mg/dL (ref 7.0–26.0)
CO2: 22 meq/L (ref 22–29)
Calcium: 9.3 mg/dL (ref 8.4–10.4)
Chloride: 109 meq/L (ref 98–109)
Creatinine: 0.7 mg/dL (ref 0.6–1.1)
EGFR: >90 ml/min/1.73 m2 (ref >90)
Glucose: 91 mg/dL (ref 70–140)
Potassium: 3.9 meq/L (ref 3.5–5.1)
Sodium: 142 meq/L (ref 136–145)
Total Bilirubin: 0.9 mg/dL (ref 0.20–1.20)
Total Protein: 6.3 g/dL — ABNORMAL LOW (ref 6.4–8.3)

## 2016-07-02 LAB — CBC WITH DIFFERENTIAL/PLATELET
BASO%: 0.8 % (ref 0.0–2.0)
Basophils Absolute: 0 10*3/uL (ref 0.0–0.1)
EOS%: 1.5 % (ref 0.0–7.0)
Eosinophils Absolute: 0 10*3/uL (ref 0.0–0.5)
HCT: 30.5 % — ABNORMAL LOW (ref 34.8–46.6)
HGB: 10.5 g/dL — ABNORMAL LOW (ref 11.6–15.9)
LYMPH%: 25.7 % (ref 14.0–49.7)
MCH: 33.2 pg (ref 25.1–34.0)
MCHC: 34.4 g/dL (ref 31.5–36.0)
MCV: 96.5 fL (ref 79.5–101.0)
MONO#: 0.2 10*3/uL (ref 0.1–0.9)
MONO%: 8 % (ref 0.0–14.0)
NEUT#: 1.7 10*3/uL (ref 1.5–6.5)
NEUT%: 64 % (ref 38.4–76.8)
Platelets: 125 10*3/uL — ABNORMAL LOW (ref 145–400)
RBC: 3.16 10*6/uL — ABNORMAL LOW (ref 3.70–5.45)
RDW: 14 % (ref 11.2–14.5)
WBC: 2.6 10*3/uL — ABNORMAL LOW (ref 3.9–10.3)
lymph#: 0.7 10*3/uL — ABNORMAL LOW (ref 0.9–3.3)
nRBC: 0 % (ref 0–0)

## 2016-07-02 MED ORDER — DEXAMETHASONE SODIUM PHOSPHATE 10 MG/ML IJ SOLN
INTRAMUSCULAR | Status: AC
  Filled 2016-07-02: qty 1

## 2016-07-02 MED ORDER — DIPHENHYDRAMINE HCL 50 MG/ML IJ SOLN
INTRAMUSCULAR | Status: AC
  Filled 2016-07-02: qty 1

## 2016-07-02 MED ORDER — SODIUM CHLORIDE 0.9 % IV SOLN
286.4000 mg | Freq: Once | INTRAVENOUS | Status: AC
  Administered 2016-07-02: 290 mg via INTRAVENOUS
  Filled 2016-07-02: qty 29

## 2016-07-02 MED ORDER — PACLITAXEL CHEMO INJECTION 300 MG/50ML
80.0000 mg/m2 | Freq: Once | INTRAVENOUS | Status: AC
  Administered 2016-07-02: 162 mg via INTRAVENOUS
  Filled 2016-07-02: qty 27

## 2016-07-02 MED ORDER — SODIUM CHLORIDE 0.9 % IV SOLN
Freq: Once | INTRAVENOUS | Status: AC
  Administered 2016-07-02: 12:00:00 via INTRAVENOUS

## 2016-07-02 MED ORDER — SODIUM CHLORIDE 0.9% FLUSH
10.0000 mL | INTRAVENOUS | Status: DC | PRN
  Administered 2016-07-02: 10 mL via INTRAVENOUS
  Filled 2016-07-02: qty 10

## 2016-07-02 MED ORDER — HEPARIN SOD (PORK) LOCK FLUSH 100 UNIT/ML IV SOLN
500.0000 [IU] | Freq: Once | INTRAVENOUS | Status: AC | PRN
  Administered 2016-07-02: 500 [IU]
  Filled 2016-07-02: qty 5

## 2016-07-02 MED ORDER — SODIUM CHLORIDE 0.9% FLUSH
10.0000 mL | INTRAVENOUS | Status: DC | PRN
  Administered 2016-07-02: 10 mL
  Filled 2016-07-02: qty 10

## 2016-07-02 MED ORDER — FAMOTIDINE IN NACL 20-0.9 MG/50ML-% IV SOLN
INTRAVENOUS | Status: AC
  Filled 2016-07-02: qty 50

## 2016-07-02 MED ORDER — DEXAMETHASONE SODIUM PHOSPHATE 10 MG/ML IJ SOLN
10.0000 mg | Freq: Once | INTRAMUSCULAR | Status: AC
  Administered 2016-07-02: 10 mg via INTRAVENOUS

## 2016-07-02 MED ORDER — FAMOTIDINE IN NACL 20-0.9 MG/50ML-% IV SOLN
20.0000 mg | Freq: Once | INTRAVENOUS | Status: AC
  Administered 2016-07-02: 20 mg via INTRAVENOUS

## 2016-07-02 MED ORDER — DIPHENHYDRAMINE HCL 50 MG/ML IJ SOLN
50.0000 mg | Freq: Once | INTRAMUSCULAR | Status: AC
  Administered 2016-07-02: 50 mg via INTRAVENOUS

## 2016-07-02 NOTE — Telephone Encounter (Signed)
Appointments scheduled per 07/02/16 los. Patient was given a copy of the AVS report and appointment schedule per 07/02/16 los. °

## 2016-07-02 NOTE — Progress Notes (Signed)
[name]  [phone] [phone]  Clinic Follow up Note   Patient Care Team: Maisie Fus, MD as PCP - General (Obstetrics and Gynecology) Collene Gobble, MD as Attending Physician (Psychiatry) Rolm Bookbinder, MD as Consulting Physician (General Surgery) Truitt Merle, MD as Consulting Physician (Hematology) Kyung Rudd, MD as Consulting Physician (Radiation Oncology) 07/02/2016   CHIEF COMPLAINTS:  Follow up left breast triple negative cancer  Oncology History   Malignant neoplasm of upper-outer quadrant of left female breast [facility])   Staging form: Breast, AJCC 7th Edition   - Clinical stage from 01/29/2016: Stage IIB (T2, N1, M0) - Signed by Truitt Merle, MD on 02/06/2016      Malignant neoplasm of upper-outer quadrant of left female breast ([place])   01/28/2016 Mammogram    Diagnostic MM and US showed a 2.9cm (3.2X2.2X2.5cm on [place]) mass in [place], multiple enlarged left axillary nodes, largest 2.5cm.       01/29/2016 Initial Diagnosis    Malignant neoplasm of upper-outer quadrant of left female breast ([place])      01/29/2016 Initial Biopsy    Left breast mass and axillary node biopsy showed IDC, G3,       01/29/2016 Receptors her2    ER-, PR-, HER2-, Ki67 85%      02/11/2016 Imaging    Bilateral breast MRI with and without contrast showed a 3.3 x 2.6 x 2.6 cm mass in the upper outer left breast, and left axillary lymphadenopathy, at least 3 abnormal lymph nodes, no other additional sites of concern.      02/12/2016 Imaging    CT chest, abdomen and pelvis with contrast showed a 2.4 x 2.7 cm lesion in the upper outer left breast, left axillary node metastasis measuring up to 1.3 cm, no evidence of distant metastasis.      02/12/2016 Imaging    Bone scan was negative for skeletal metastasis.      02/13/2016 -  Neo-Adjuvant Chemotherapy    Dose dense Adriamycin 60 mg/m, Cytoxan 600 mg/m, every 2 weeks, for 4 cycles, followed by weekly  carboplatin and Taxol for 12 weeks       03/27/2016 Genetic Testing    Patient has genetic testing done for personal history of breast cancer, family history of cancer. ATM c.2606C>T VUS identified on the Breast/GYN panel.  Negative genetic testing for the MSH2 inversion analysis (Boland inversion). The Breast/GYN gene panel offered by [facility] includes sequencing and rearrangement analysis for the following 23 genes:  ATM, BARD1, BRCA1, BRCA2, BRIP1, CDH1, CHEK2, EPCAM, FANCC, MLH1, MSH2, MSH6, MUTYH, NBN, NF1, PALB2, PMS2, POLD1, PTEN, RAD51C, RAD51D, RECQL, and TP53.         HISTORY OF PRESENTING ILLNESS (02/06/16) :  Grace Moyer 56 y.o. female is here because of her recently diagnosed left breast cancer. She was accompanied By her husband to our multidisciplinary breast clinic today.  This was discovered by screening mammogram, she had no palpable breast mass or axilla note. She denies any other new symptoms. She underwent a diagnostic mammogram and ultrasound on 01/28/2016, which showed a 2.9 cm mass in the upper outer quadrant, and multiple enlarged left axillary nodes, largest 2.5 cm. She underwent core needle biopsy of the left breast mass and left axillary node, both showed invasive ductal carcinoma, grade 3, triple negative, Ki-67 85%.  She developed bilateral numbness and tingling of her legs and hands at the end of May 2017. She underwent a multiple scans, and lab test, and was  finally seen by neurologist Dr. Moshe Cipro, and was diagnosed with neuromyelitis optica spectrum disorder (NOSD). She initially received high-dose steroids, and then started Rituxan infusion, status post 2 infusions, now is on every four-month schedule, next due in Jan 2018. She states her neuropathy has been stable, she denies any significant vision problem, or other neurological symptoms. Her hand function and gait are normal. She is an Clinical biochemist.   GYN HISTORY  Menarchal: 13 LMP: 51  (hysterectomy) Contraceptive: 3 years  HRT: n/a  G1P1: 74 yo daughter   CURRENT THERAPY: Neoadjuvant dose dense Adriamycin 60 mg/m, Cytoxan 600 mg/m, every 2 weeks, for 4 cycles, followed by weekly carboplatin and Taxol for 12 weeks started 02/13/2016  INTERIM HISTORY  Grace Moyer returns for follow-up and last cycle [place] and taxol today.She presents to the clinic today with her mother. She reports after her treatment that she struggles with restless legs syndrome during the treatment and bilaterally weakness after treatment. Last week during infusion she reports a 99 fever but has since resolved. She saw Dr. Donne Hazel last week to discuss what her options for surgery. Will wait for MRI to schedule surgery. She mentions moving her Rituxan to [facility] instead of [facility] due to expenses and distance. She requested a signature for her disability for surgery and treatment time as well as weakness.   MEDICAL HISTORY:  Past Medical History:  Diagnosis Date  . Anxiety   . Family history of breast cancer   . Family history of colon cancer   . Malignant neoplasm of upper-outer quadrant of left female breast ([place]) 01/31/2016  . Neuromyelitis optica ([place])     SURGICAL HISTORY: Past Surgical History:  Procedure Laterality Date  . ABDOMINAL HYSTERECTOMY     partial  . EYE SURGERY    . PORTACATH PLACEMENT Right 02/11/2016   Procedure: INSERTION PORT-A-CATH WITH [place];  Surgeon: Rolm Bookbinder, MD;  Location: [facility];  Service: General;  Laterality: Right;    SOCIAL HISTORY: Social History   Social History  . Marital status: Married    Spouse name: N/A  . Number of children: N/A  . Years of education: N/A   Occupational History  . Not on file.   Social History Main Topics  . Smoking status: Former Smoker    Types: Cigarettes    Quit date: 09/03/2001  . Smokeless tobacco: Never Used  . Alcohol use No  . Drug use: No  . Sexual activity: Not on file   Other Topics Concern   . Not on file   Social History Narrative  . No narrative on file    FAMILY HISTORY: Family History  Problem Relation Age of Onset  . Breast cancer Maternal Grandmother     dx in her 67s  . CAD Paternal Grandmother   . Colon cancer Paternal Uncle     dx in his 24s  . COPD Maternal Grandfather   . CAD Paternal Grandfather   . Breast cancer Cousin     dx in her early to mid 106s; maternal first cousin  . Lung cancer Paternal Uncle     ALLERGIES:  has No Known Allergies.  MEDICATIONS:  Current Outpatient Prescriptions  Medication Sig Dispense Refill  . Cholecalciferol (VITAMIN D-1000 MAX ST) 1000 units tablet Take 2 tablets by mouth daily.    Marland Kitchen gabapentin (NEURONTIN) 100 MG capsule     . lidocaine-prilocaine (EMLA) cream Apply to affected area once 30 g 3  . omeprazole (PRILOSEC) 40 MG capsule Take  40 mg by mouth daily.    . ondansetron (ZOFRAN) 8 MG tablet Take 1 tablet (8 mg total) by mouth 2 (two) times daily as needed. Start on the third day after chemotherapy. 30 tablet 1  . prochlorperazine (COMPAZINE) 10 MG tablet Take 1 tablet (10 mg total) by mouth every 6 (six) hours as needed (Nausea or vomiting). 30 tablet 1  . riTUXimab (RITUXAN) 100 MG/10ML injection Inject into the vein every 4 (four) months.    Marland Kitchen HYDROcodone-acetaminophen (NORCO) 10-325 MG tablet Take 1 tablet by mouth every 6 (six) hours as needed. (Patient not taking: Reported on 06/18/2016) 15 tablet 0   No current facility-administered medications for this visit.    Facility-Administered Medications Ordered in Other Visits  Medication Dose Route Frequency Provider Last Rate Last Dose  . heparin lock flush 100 unit/mL  500 Units Intravenous Once PRN Truitt Merle, MD      . sodium chloride flush (NS) 0.9 % injection 10 mL  10 mL Intracatheter PRN Truitt Merle, MD   10 mL at 06/18/16 1540  . sodium chloride flush (NS) 0.9 % injection 10 mL  10 mL Intravenous PRN Truitt Merle, MD      . sodium chloride flush (NS) 0.9 %  injection 10 mL  10 mL Intravenous PRN Truitt Merle, MD   10 mL at 06/25/16 1106    REVIEW OF SYSTEMS:   Constitutional: Denies fevers, chills or abnormal night sweats (+) fatigue Eyes: Denies blurriness of vision, double vision or watery eyes Ears, nose, mouth, throat, and face: Denies mucositis or sore throat Respiratory: Denies cough, dyspnea or wheezes Cardiovascular: Denies palpitation, chest discomfort or lower extremity swelling Gastrointestinal:  Denies nausea, heartburn Skin: Denies abnormal skin rashes Lymphatics: Denies new lymphadenopathy or easy bruising Neurological:(+) bilateral leg weakness (+) tingling/numbness, neuropathy stable MSK: (+) restless leg feelings during treatment Behavioral/Psych: (+) anxiety about cancer recurrance All other systems were reviewed with the patient and are negative.  PHYSICAL EXAMINATION: ECOG PERFORMANCE STATUS: 1 - Symptomatic but completely ambulatory BP 117/64 (BP Location: Left Arm, Patient Position: Sitting)   Pulse 82   Temp 98.5 F (36.9 C) (Oral)   Resp 17   Ht 5' 3"  (1.6 m)   Wt 188 lb 1.6 oz (85.3 kg)   SpO2 100%   BMI 33.32 kg/m   GENERAL:alert, no distress and comfortable.  SKIN: skin color, texture, turgor are normal, no rashes, the previously infected sebaceous cyst in the right buttock has resolved and healed well. No skin erythema. EYES: normal, conjunctiva are pink and non-injected, sclera clear OROPHARYNX:no exudate, no erythema and lips, buccal mucosa, and tongue normal  NECK: supple, thyroid normal size, non-tender, without nodularity. (+) Incision in the right lower neck for port. Port appears clean, neck not significantly swollen, no erythema, no sign of infection. LYMPH:  no palpable lymphadenopathy in the cervical, axillary or inguinal LUNGS: clear to auscultation and percussion with normal breathing effort HEART: regular rate & rhythm and no murmurs and no lower extremity edema ABDOMEN:abdomen soft, non-tender  and normal bowel sounds MUSCULOSKELETALl:no cyanosis of digits and no clubbing  PSYCH: alert & oriented x 3 with fluent speech NEURO: no focal motor/sensory deficits Breasts: Breast inspection showed them to be symmetrical with no nipple discharge. Palpation of the breasts and axilla revealed no obvious mass that I could appreciate, there is fullness at the upper-outter quadrant of left breast.    LABORATORY DATA:  I have reviewed the data as listed CBC Latest Ref Rng &  Units 07/02/2016 06/25/2016 06/18/2016  WBC 3.9 - 10.3 10e3/uL 2.6(L) 3.5(L) 2.9(L)  Hemoglobin 11.6 - 15.9 g/dL 10.5(L) 11.5(L) 10.7(L)  Hematocrit 34.8 - 46.6 % 30.5(L) 33.7(L) 32.0(L)  Platelets 145 - 400 10e3/uL 125(L) 202 196   CMP Latest Ref Rng & Units 07/02/2016 06/25/2016 06/18/2016  Glucose 70 - 140 mg/dl 91 94 93  BUN 7.0 - 26.0 mg/dL 8.3 8.0 10.1  Creatinine 0.6 - 1.1 mg/dL 0.7 0.7 0.6  Sodium 136 - 145 mEq/L 142 141 141  Potassium 3.5 - 5.1 mEq/L 3.9 4.1 4.0  Chloride 101 - 111 mmol/L - - -  CO2 22 - 29 mEq/L 22 22 22   Calcium 8.4 - 10.4 mg/dL 9.3 9.2 9.1  Total Protein 6.4 - 8.3 g/dL 6.3(L) 6.3(L) 6.2(L)  Total Bilirubin 0.20 - 1.20 mg/dL 0.90 0.70 0.81  Alkaline Phos 40 - 150 U/L 85 88 91  AST 5 - 34 U/L 31 29 40(H)  ALT 0 - 55 U/L 41 43 53   ANC 1.8 07/02/16: ANC: 0.7  PATHOLOGY REPORT  Diagnosis 01/29/2016 1. Breast, left, needle core biopsy, 1:30 o'clock - INVASIVE DUCTAL CARCINOMA, GRADE 3. - LYMPHOVASCULAR INVOLVEMENT BY TUMOR. 2. Lymph node, needle/core biopsy, left axillary - METASTATIC CARCINOMA.  RADIOGRAPHIC STUDIES: I have personally reviewed the radiological images as listed and agreed with the findings in the report. No results found.  Bone scan 02/12/2016 IMPRESSION: No evidence of skeletal metastatic disease.  CT chest abdomen pelvis w contrast 02/12/2016 IMPRESSION: 2.4 x 2.7 cm lesion in the upper outer left breast, corresponding to the patient's known breast neoplasm. Left  axillary nodal metastasis measuring up to 13 mm short axis. No evidence of distant metastasis.  ASSESSMENT & PLAN: 56 y.o. Caucasian woman with past medical history of depression, presented with a screening mammogram discovered left breast cancer   1. Malignant neoplasm of upper-outer quadrant of left breast, invasive ductal carcinoma, G3, cT2N1M0, stage IIB, ER-/PR-/HER2- -I previously reviewed her imaging findings and the biopsy results in great detail with patient and her husband. -I previously discussed her breast MRI, CT scan and bone scan findings, which all confirmed the left breast lesion and axillary adenopathy, no other distant metastasis. -she was previously seen by Dr. Donne Hazel and surgical options were discussed  -We previously discussed that triple negative breast cancers are much more aggressive, and her risk of cancer recurrence after breast surgery is pretty high, giving the multiple positive lymph nodes. I recommend her to consider neoadjuvant chemotherapy, to reduce her risk of recurrence, and downstage her breast cancer, to make lumpectomy and sentinel lymph node biopsy as a more feasible surgery. -I previously recommended her to have dose dense Adriamycin, Cytoxan every 2 weeks, with Neulasta support, for 4 cycles, followed by weekly carboplatin and Taxol for 12 weeks  -the goal of therapy is curative  -Due to her underlying NOSD, and existing peripheral neuropathy, my main concern of long-term side effects from chemotherapy is worsening neuropathy, especially from Taxol, we will monitor closely, and may need to hold it if her neuropathy gets worse.  -Her previous baseline echocardiogram was normal. -Patient is tolerating chemotherapy well, left breast mass is not palpable anymore, she is clinically responding to chemotherapy, we'll continue chemotherapy. -Lab previously reviewed. Mild pancytopenia, adequate for treatment, we proceeded with 12th week carbo and Taxol  -She was  seen by her breast surgeon Dr. Donne Hazel yesterday. If she has good response, she is likely a candidate for lumpectomy and sentinel lymph node biopsy. She  initially referred mastectomy, and agreed with lumpectomy after discussion with Dr. Donne Hazel again yesterday. -Her repeated breast MRI is scheduled for 5/11 -we discussed the role of adjuvant chemo. Due to her underline NOSD, she would not be a candidate for our immunotherapy adjuvant trial. I do not plan to give her her adjuvant IV chemotherapy, but would consider oral Xeloda if she has significant residual disease. -Okay to remove her port during her breast surgery.  2. Genetics -Given her young age and triple negative disease, we recommend her to have genetic testing to rule out inheritable breast cancer syndrome, and the test result may impact her surgery. -ATM c.2606C>T VUS identified on the Breast/Gyn panel.  Negative genetic testing for the MSH2 inversion analysis ([place]and inversion). The Breast/GYN gene panel offered by [facility] includes sequencing and rearrangement analysis for the following 23 genes:  ATM, BARD1, BRCA1, BRCA2, BRIP1, CDH1, CHEK2, EPCAM, FANCC, MLH1, MSH2, MSH6, MUTYH, NBN, NF1, PALB2, PMS2, POLD1, PTEN, RAD51C, RAD51D, RECQL, and TP53.   The report date is March 27, 2016  3. neuromyelitis optica spectrum disorder (NOSD) -she will continue follow up with her neurologist Dr. Moshe Cipro at [facility]  -she is on rituximab every 4 weeks. It will be OK to continue her Rituximab when she is on chemotherapy, but the combination therapy will further compromise her immune system. -I have previously spoken with Dr. Moshe Cipro about her breast cancer treatment, he is agreeable. -She previously received Rituxan in our cancer center on 03/21/2016, tolerated generally well. She prefers to get next dose Rituxan here with [place], which is scheduled for 07/11/2016  5. Anemia secondary to chemo -overall mild and stable -We'll monitor closely  Plan -Labs  reviewed and she will proceed with last [place] and taxol cycle treatment today  -Bilateral breast MRI on 07/11/2016 -She will return on May 11 18 4  Rituxan infusion -Lab and follow-up in 3 weeks -She was seen by Dr. Donne Hazel yesterday  No orders of the defined types were placed in this encounter.   All questions were answered. The patient knows to call the clinic with any problems, questions or concerns.  I spent 20 minutes counseling the patient face to face. The total time spent in the appointment was 25 minutes and more than 50% was on counseling.  This document serves as a record of services personally performed by Truitt Merle, MD. It was created on her behalf by Joslyn Devon, a trained medical scribe. The creation of this record is based on the scribe's personal observations and the provider's statements to them. This document has been checked and approved by the attending provider.     Truitt Merle, MD 07/02/2016

## 2016-07-02 NOTE — Patient Instructions (Signed)
[facility] Discharge Instructions for Patients Receiving Chemotherapy  Today you received the following chemotherapy agents Taxol/Carboplatin To help prevent nausea and vomiting after your treatment, we encourage you to take your nausea medication as prescribed.   If you develop nausea and vomiting that is not controlled by your nausea medication, call the clinic.   BELOW ARE SYMPTOMS THAT SHOULD BE REPORTED IMMEDIATELY:  *FEVER GREATER THAN 100.5 F  *CHILLS WITH OR WITHOUT FEVER  NAUSEA AND VOMITING THAT IS NOT CONTROLLED WITH YOUR NAUSEA MEDICATION  *UNUSUAL SHORTNESS OF BREATH  *UNUSUAL BRUISING OR BLEEDING  TENDERNESS IN MOUTH AND THROAT WITH OR WITHOUT PRESENCE OF ULCERS  *URINARY PROBLEMS  *BOWEL PROBLEMS  UNUSUAL RASH Items with * indicate a potential emergency and should be followed up as soon as possible.  Feel free to call the clinic you have any questions or concerns. The clinic phone number is [phone].  Please show the CHEMO ALERT CARD at check-in to the Emergency Department and triage nurse.   

## 2016-07-03 ENCOUNTER — Ambulatory Visit (HOSPITAL_BASED_OUTPATIENT_CLINIC_OR_DEPARTMENT_OTHER): Payer: 59

## 2016-07-03 ENCOUNTER — Telehealth: Payer: Self-pay | Admitting: *Deleted

## 2016-07-03 DIAGNOSIS — Z5189 Encounter for other specified aftercare: Secondary | ICD-10-CM

## 2016-07-03 DIAGNOSIS — C50412 Malignant neoplasm of upper-outer quadrant of left female breast: Secondary | ICD-10-CM

## 2016-07-03 DIAGNOSIS — Z171 Estrogen receptor negative status [ER-]: Secondary | ICD-10-CM

## 2016-07-03 MED ORDER — TBO-FILGRASTIM 480 MCG/0.8ML ~~LOC~~ SOSY
480.0000 ug | PREFILLED_SYRINGE | Freq: Once | SUBCUTANEOUS | Status: AC
  Administered 2016-07-03: 480 ug via SUBCUTANEOUS
  Filled 2016-07-03: qty 0.8

## 2016-07-03 NOTE — Telephone Encounter (Signed)
Called pt to congratulate on completion of chemo. Pt scheduled to see Dr. Iran Planas to discuss reconstruction for mastectomy. Denies questions or needs at this time.

## 2016-07-04 ENCOUNTER — Encounter: Payer: Self-pay | Admitting: Hematology

## 2016-07-10 ENCOUNTER — Other Ambulatory Visit: Payer: Self-pay | Admitting: Hematology

## 2016-07-10 DIAGNOSIS — C50412 Malignant neoplasm of upper-outer quadrant of left female breast: Secondary | ICD-10-CM | POA: Diagnosis not present

## 2016-07-10 DIAGNOSIS — N62 Hypertrophy of breast: Secondary | ICD-10-CM | POA: Diagnosis not present

## 2016-07-10 DIAGNOSIS — M542 Cervicalgia: Secondary | ICD-10-CM | POA: Diagnosis not present

## 2016-07-11 ENCOUNTER — Ambulatory Visit (HOSPITAL_COMMUNITY)
Admission: RE | Admit: 2016-07-11 | Discharge: 2016-07-11 | Disposition: A | Discharge status: Home / Self Care | Payer: 59 | Source: Ambulatory Visit | Attending: Hematology | Admitting: Hematology

## 2016-07-11 ENCOUNTER — Ambulatory Visit (HOSPITAL_BASED_OUTPATIENT_CLINIC_OR_DEPARTMENT_OTHER): Payer: 59

## 2016-07-11 VITALS — BP 103/50 | HR 77 | Temp 98.9°F | Resp 16

## 2016-07-11 DIAGNOSIS — C50412 Malignant neoplasm of upper-outer quadrant of left female breast: Secondary | ICD-10-CM | POA: Diagnosis not present

## 2016-07-11 DIAGNOSIS — Z171 Estrogen receptor negative status [ER-]: Secondary | ICD-10-CM | POA: Diagnosis not present

## 2016-07-11 DIAGNOSIS — Z5112 Encounter for antineoplastic immunotherapy: Secondary | ICD-10-CM | POA: Diagnosis not present

## 2016-07-11 DIAGNOSIS — G360 Neuromyelitis optica [Devic]: Secondary | ICD-10-CM

## 2016-07-11 DIAGNOSIS — C50912 Malignant neoplasm of unspecified site of left female breast: Secondary | ICD-10-CM | POA: Diagnosis not present

## 2016-07-11 MED ORDER — ACETAMINOPHEN 325 MG PO TABS
ORAL_TABLET | ORAL | Status: AC
  Filled 2016-07-11: qty 2

## 2016-07-11 MED ORDER — DIPHENHYDRAMINE HCL 50 MG/ML IJ SOLN
INTRAMUSCULAR | Status: AC
  Filled 2016-07-11: qty 1

## 2016-07-11 MED ORDER — ACETAMINOPHEN 325 MG PO TABS
650.0000 mg | ORAL_TABLET | Freq: Once | ORAL | Status: AC
  Administered 2016-07-11: 650 mg via ORAL

## 2016-07-11 MED ORDER — GADOBENATE DIMEGLUMINE 529 MG/ML IV SOLN
20.0000 mL | Freq: Once | INTRAVENOUS | Status: AC | PRN
  Administered 2016-07-11: 17 mL via INTRAVENOUS

## 2016-07-11 MED ORDER — SODIUM CHLORIDE 0.9 % IV SOLN
500.0000 mg | Freq: Once | INTRAVENOUS | Status: AC
  Administered 2016-07-11: 500 mg via INTRAVENOUS
  Filled 2016-07-11: qty 50

## 2016-07-11 MED ORDER — DIPHENHYDRAMINE HCL 25 MG PO CAPS
ORAL_CAPSULE | ORAL | Status: AC
  Filled 2016-07-11: qty 1

## 2016-07-11 MED ORDER — SODIUM CHLORIDE 0.9 % IV SOLN
Freq: Once | INTRAVENOUS | Status: AC
  Administered 2016-07-11: 12:00:00 via INTRAVENOUS

## 2016-07-11 MED ORDER — SODIUM CHLORIDE 0.9% FLUSH
10.0000 mL | INTRAVENOUS | Status: DC | PRN
  Administered 2016-07-11: 10 mL
  Filled 2016-07-11: qty 10

## 2016-07-11 MED ORDER — HEPARIN SOD (PORK) LOCK FLUSH 100 UNIT/ML IV SOLN
500.0000 [IU] | Freq: Once | INTRAVENOUS | Status: AC | PRN
  Administered 2016-07-11: 500 [IU]
  Filled 2016-07-11: qty 5

## 2016-07-11 MED ORDER — DIPHENHYDRAMINE HCL 50 MG/ML IJ SOLN
25.0000 mg | Freq: Once | INTRAMUSCULAR | Status: AC
  Administered 2016-07-11: 25 mg via INTRAVENOUS

## 2016-07-11 NOTE — Patient Instructions (Addendum)
[facility] Discharge Instructions for Patients Receiving Chemotherapy  Today you received the following chemotherapy agents:  rituximab (Rituxan)  To help prevent nausea and vomiting after your treatment, we encourage you to take your nausea medication as directed.   If you develop nausea and vomiting that is not controlled by your nausea medication, call the clinic.   BELOW ARE SYMPTOMS THAT SHOULD BE REPORTED IMMEDIATELY:  *FEVER GREATER THAN 100.5 F  *CHILLS WITH OR WITHOUT FEVER  NAUSEA AND VOMITING THAT IS NOT CONTROLLED WITH YOUR NAUSEA MEDICATION  *UNUSUAL SHORTNESS OF BREATH  *UNUSUAL BRUISING OR BLEEDING  TENDERNESS IN MOUTH AND THROAT WITH OR WITHOUT PRESENCE OF ULCERS  *URINARY PROBLEMS  *BOWEL PROBLEMS  UNUSUAL RASH Items with * indicate a potential emergency and should be followed up as soon as possible.  Feel free to call the clinic you have any questions or concerns. The clinic phone number is [phone].  Please show the CHEMO ALERT CARD at check-in to the Emergency Department and triage nurse. 

## 2016-07-15 ENCOUNTER — Other Ambulatory Visit: Payer: Self-pay | Admitting: General Surgery

## 2016-07-15 DIAGNOSIS — Z170 Estrogen receptor positive status [ER+]: Secondary | ICD-10-CM

## 2016-07-15 DIAGNOSIS — C50412 Malignant neoplasm of upper-outer quadrant of left female breast: Secondary | ICD-10-CM

## 2016-07-15 MED ORDER — KETOROLAC TROMETHAMINE 15 MG/ML IJ SOLN: 15.0000 mg | Freq: Once | INTRAMUSCULAR | Status: AC

## 2016-07-17 ENCOUNTER — Other Ambulatory Visit: Payer: Self-pay | Admitting: General Surgery

## 2016-07-17 ENCOUNTER — Telehealth: Payer: Self-pay | Admitting: Hematology

## 2016-07-17 DIAGNOSIS — Z170 Estrogen receptor positive status [ER+]: Secondary | ICD-10-CM

## 2016-07-17 DIAGNOSIS — C50412 Malignant neoplasm of upper-outer quadrant of left female breast: Secondary | ICD-10-CM

## 2016-07-17 NOTE — Telephone Encounter (Signed)
Per 5/17 schedule message moved 5/23 lab/fu to AM. Spoke with patient she is aware.

## 2016-07-21 NOTE — H&P (Signed)
Subjective:     Patient ID: Grace Moyer is a 56 y.o. female.  HPI  Patient of Drs. Anastasio Auerbach referred for consultation breast reconstruction. Presented following screening MMG.  Diagnostic MMG/US showed a 2.9 cm mass in the left UOQ, and multiple enlarged left axillary nodes, largest 2.5 cm. Biopsy of the left breast mass and left axillary node, both showed IDC, triple negative.  MRI initital with 3.3 x 2.6 x 2.6 cm mass in the upper outer left breast, and left axillary lymphadenopathy, at least 3 abnormal lymph nodes. Staging scans negative for distant disease.   Completed neoadjuvant chemotherapy 5.2.18. Final MRI scheduled 5.11.18. Has discussed mastectomy vs lumpectomy with Dr. Donne Hazel.  Genetics with ATM c.2606C>T VUS identified.   Current D cup. Notes several year history of neck and back pain prior to cancer diagnosis. Has tried specialty bras, OTC pain medication for this without change. Notes bilateral numbness hands, though this also may be related to her NOSD diagnosis. Notes yeast rashes beneath breasts more than 2-3 times per year, treats herself with frequent washing.  Wt 205 lb prior to diagnosis, down to 190 with chemotherapy.  PHM significant for onset bilateral numbness and tingling of her legs and hands at the end of May 2017.Ultimately diagnosed with neuromyelitis optica spectrum disorder (NOSD). She initially received high-dose steroids, and then started Rituxan infusion, and is on every four-month schedule, next due tomorrow.  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   Review of Systems  Constitutional: Positive for appetite change and fatigue.  Musculoskeletal: Positive for back pain and myalgias.  Remainder 12 point review negative.    Objective:   Physical Exam  Constitutional: She is oriented to person, place, and time.  Cardiovascular: Normal rate.   Pulmonary/Chest: Effort  normal.  Abdominal: Soft.  Lymphadenopathy:    She has no axillary adenopathy.  Neurological: She is alert and oriented to person, place, and time.  Skin:  Fitzpatrick 2     grade 3 ptosis bilateral, R>L volume No palpable masses SN to nipple R 31 L 30.5 cm BW R 22.5 L 21 cm Nipple to IMF R 13 L 15 cm  Assessment:     Macromastia Chronic neck and back pain Left breast ca UOQ triple negative Neoadjuvant chemotherapy    Plan:     Presents stating Dr. Donne Hazel had discussed equivalent recurrence with per post lumpectomy/XRT vs mastectomy and she is now settled on former. I counseled that this procedure +/- oncoplastic reconstruction would return her to work sooner than mastectomy +/- post mastectomy reconstruction.  Discussed oncoplastic reconstruction as staged procedure with reduction 7-10 d post lumpectomy to ensure pathologic clearance.Reviewed reduction with anchor type scars, drains, post operative visits and limitations, recovery. Diminished sensation nipple and breast skin, risk of nipple loss, wound healing problems, asymmetry. Discussedsmaller breast size may aid with adjuvant radiation. Discussed will have some contraction of breast volume and increased firmness with radiation, less ptosis with aging. Discussed changes with wt gain, loss, aging. If she pursues partial mastectomy, highly encourage her to pursue breast reduction prior to XRT as risk of complications post would be very high. Counseled I cannot assure her bra size.    We discussed post mastectomy reconstruction. Given anticipated radiation, discussed this would double her risks reconstruction and recommend delayed reconstruction if she pursued mastectomy. Discussed some type of autologous tissue would be recommended with radiation for reconstruction. Discussed LD+TE vs TRAM, DIEP. All of these  procedures would have lengthy time out of work and prolonged rehabilitation given physical nature of her  work.  Patient desires to proceed with oncoplastic reconstruction. Pictures taken. Second to [employer] given.  Irene Limbo, MD [facility]astic [facility] Reconstruct[facility] Surgery [phone], pin [phone]

## 2016-07-21 NOTE — Progress Notes (Signed)
[facility]  Telephone:([phone]) [phone] Fax:([phone]) [phone]  Clinic Follow up Note   Patient Care Team: Maisie Fus, MD as PCP - General (Obstetrics and Gynecology) Collene Gobble, MD as Attending Physician (Psychiatry) Rolm Bookbinder, MD as Consulting Physician (General Surgery) Truitt Merle, MD as Consulting Physician (Hematology) Kyung Rudd, MD as Consulting Physician (Radiation Oncology) 07/23/2016   CHIEF COMPLAINTS:  Follow up left breast triple negative cancer  Oncology History   Malignant neoplasm of upper-outer quadrant of left female breast [facility])   Staging form: Breast, AJCC 7th Edition   - Clinical stage from 01/29/2016: Stage IIB (T2, N1, M0) - Signed by Truitt Merle, MD on 02/06/2016      Malignant neoplasm of upper-outer quadrant of left female breast ([place])   01/28/2016 Mammogram    Diagnostic MM and US showed a 2.9cm (3.2X2.2X2.5cm on [place]) mass in [place], multiple enlarged left axillary nodes, largest 2.5cm.       01/29/2016 Initial Diagnosis    Malignant neoplasm of upper-outer quadrant of left female breast ([place])      01/29/2016 Initial Biopsy    Left breast mass and axillary node biopsy showed IDC, G3,       01/29/2016 Receptors her2    ER-, PR-, HER2-, Ki67 85%      02/11/2016 Imaging    Bilateral breast MRI with and without contrast showed a 3.3 x 2.6 x 2.6 cm mass in the upper outer left breast, and left axillary lymphadenopathy, at least 3 abnormal lymph nodes, no other additional sites of concern.      02/12/2016 Imaging    CT chest, abdomen and pelvis with contrast showed a 2.4 x 2.7 cm lesion in the upper outer left breast, left axillary node metastasis measuring up to 1.3 cm, no evidence of distant metastasis.      02/12/2016 Imaging    Bone scan was negative for skeletal metastasis.      02/13/2016 - 07/03/2016 Neo-Adjuvant Chemotherapy    Dose dense Adriamycin 60 mg/m, Cytoxan 600 mg/m, every 2 weeks, for 4 cycles, followed by  weekly carboplatin and Taxol for 12 weeks       03/27/2016 Genetic Testing    Patient has genetic testing done for personal history of breast cancer, family history of cancer. ATM c.2606C>T VUS identified on the Breast/GYN panel.  Negative genetic testing for the MSH2 inversion analysis (Boland inversion). The Breast/GYN gene panel offered by [facility] includes sequencing and rearrangement analysis for the following 23 genes:  ATM, BARD1, BRCA1, BRCA2, BRIP1, CDH1, CHEK2, EPCAM, FANCC, MLH1, MSH2, MSH6, MUTYH, NBN, NF1, PALB2, PMS2, POLD1, PTEN, RAD51C, RAD51D, RECQL, and TP53.         07/11/2016 Imaging    Bilateral Breast MRI FINDINGS: Breast composition: c. Heterogeneous fibroglandular tissue.  Background parenchymal enhancement: Minimal.  Right breast: No mass or abnormal enhancement.  Left breast: No mass or abnormal enhancement. Biopsy clip is identified at the posterior left breast upper outer quadrant. The previously noted enhancing mass is not seen currently.  Lymph nodes: No abnormal appearing lymph nodes. Previously noted abnormal lymph nodes in the left axilla are currently normal size.  Ancillary findings:  None.  IMPRESSION: Known cancer.      HISTORY OF PRESENTING ILLNESS (02/06/16) :  Grace Moyer 56 y.o. female is here because of her recently diagnosed left breast cancer. She was accompanied By her husband to our multidisciplinary breast clinic today.  This was discovered by screening mammogram, she had no palpable breast  mass or axilla note. She denies any other new symptoms. She underwent a diagnostic mammogram and ultrasound on 01/28/2016, which showed a 2.9 cm mass in the upper outer quadrant, and multiple enlarged left axillary nodes, largest 2.5 cm. She underwent core needle biopsy of the left breast mass and left axillary node, both showed invasive ductal carcinoma, grade 3, triple negative, Ki-67 85%.  She developed bilateral numbness and tingling of  her legs and hands at the end of May 2017. She underwent a multiple scans, and lab test, and was finally seen by neurologist Dr. Moshe Cipro, and was diagnosed with neuromyelitis optica spectrum disorder (NOSD). She initially received high-dose steroids, and then started Rituxan infusion, status post 2 infusions, now is on every four-month schedule, next due in Jan 2018. She states her neuropathy has been stable, she denies any significant vision problem, or other neurological symptoms. Her hand function and gait are normal. She is an Clinical biochemist.   GYN HISTORY  Menarchal: 13 LMP: 4 (hysterectomy) Contraceptive: 3 years  HRT: n/a  G1P1: 55 yo daughter   CURRENT THERAPY: Pending breast surgery  INTERIM HISTORY  Mrs Lutes returns for follow-up. She presents to the clinic today and reports to feeling better but still having tingling in fingers and toes. She is scheduled for surgery on Thursday. And will be getting lumpectomy with lymph nodes and port removal. She has a appointment with plastic surgeon for breast reduction on June 5th. She still has her back pain and she thinks it is due to arthritis and is taking alive. She plans to take some time off after surgery. She will be out for 6 weeks. Her hair has started growing back.      MEDICAL HISTORY:  Past Medical History:  Diagnosis Date  . Anxiety   . Family history of breast cancer   . Family history of colon cancer   . Malignant neoplasm of upper-outer quadrant of left female breast ([name]) 01/31/2016  . Neuromyelitis optica ([place])     SURGICAL HISTORY: Past Surgical History:  Procedure Laterality Date  . ABDOMINAL HYSTERECTOMY     partial  . EYE SURGERY    . PORTACATH PLACEMENT Right 02/11/2016   Procedure: INSERTION PORT-A-CATH WITH [place];  Surgeon: Rolm Bookbinder, MD;  Location: [facility];  Service: General;  Laterality: Right;    SOCIAL HISTORY: Social History   Social History  . Marital status: Married     Spouse name: N/A  . Number of children: N/A  . Years of education: N/A   Occupational History  . Not on file.   Social History Main Topics  . Smoking status: Former Smoker    Types: Cigarettes    Quit date: 09/03/2001  . Smokeless tobacco: Never Used  . Alcohol use No  . Drug use: No  . Sexual activity: Not on file   Other Topics Concern  . Not on file   Social History Narrative  . No narrative on file    FAMILY HISTORY: Family History  Problem Relation Age of Onset  . Breast cancer Maternal Grandmother        dx in her 5s  . CAD Paternal Grandmother   . Colon cancer Paternal Uncle        dx in his 55s  . COPD Maternal Grandfather   . CAD Paternal Grandfather   . Breast cancer Cousin        dx in her early to mid 40s; maternal first cousin  . Lung cancer  Paternal Uncle     ALLERGIES:  has No Known Allergies.  MEDICATIONS:  Current Outpatient Prescriptions  Medication Sig Dispense Refill  . Cholecalciferol (VITAMIN D-1000 MAX ST) 1000 units tablet Take 2 tablets by mouth daily.    Marland Kitchen gabapentin (NEURONTIN) 100 MG capsule     . lidocaine-prilocaine (EMLA) cream Apply to affected area once 30 g 3  . omeprazole (PRILOSEC) 40 MG capsule Take 40 mg by mouth daily.    . riTUXimab (RITUXAN) 100 MG/10ML injection Inject into the vein every 4 (four) months.    Marland Kitchen HYDROcodone-acetaminophen (NORCO) 10-325 MG tablet Take 1 tablet by mouth every 6 (six) hours as needed. (Patient not taking: Reported on 06/18/2016) 15 tablet 0  . ondansetron (ZOFRAN) 8 MG tablet Take 1 tablet (8 mg total) by mouth 2 (two) times daily as needed. Start on the third day after chemotherapy. (Patient not taking: Reported on 07/23/2016) 30 tablet 1  . prochlorperazine (COMPAZINE) 10 MG tablet Take 1 tablet (10 mg total) by mouth every 6 (six) hours as needed (Nausea or vomiting). (Patient not taking: Reported on 07/23/2016) 30 tablet 1   No current facility-administered medications for this visit.     Facility-Administered Medications Ordered in Other Visits  Medication Dose Route Frequency Provider Last Rate Last Dose  . heparin lock flush 100 unit/mL  500 Units Intravenous Once PRN Truitt Merle, MD      . sodium chloride flush (NS) 0.9 % injection 10 mL  10 mL Intracatheter PRN Truitt Merle, MD   10 mL at 06/18/16 1540  . sodium chloride flush (NS) 0.9 % injection 10 mL  10 mL Intravenous PRN Truitt Merle, MD      . sodium chloride flush (NS) 0.9 % injection 10 mL  10 mL Intravenous PRN Truitt Merle, MD   10 mL at 06/25/16 1106    REVIEW OF SYSTEMS:   Constitutional: Denies fevers, chills or abnormal night sweats (+) fatigue (+) slight hair growth Eyes: Denies blurriness of vision, double vision or watery eyes Ears, nose, mouth, throat, and face: Denies mucositis or sore throat Respiratory: Denies cough, dyspnea or wheezes Cardiovascular: Denies palpitation, chest discomfort or lower extremity swelling Gastrointestinal:  Denies nausea, heartburn Skin: Denies abnormal skin rashes Lymphatics: Denies new lymphadenopathy or easy bruising Neurological:(+) bilateral leg weakness, improved (+) tingling in fingers/toes, neuropathy stable MSK: (+) restless leg feelings during treatment, resolved (+) back pain Behavioral/Psych: (+) anxiety about cancer recurrance All other systems were reviewed with the patient and are negative.  PHYSICAL EXAMINATION: ECOG PERFORMANCE STATUS: 1 - Symptomatic but completely ambulatory BP 127/71 (BP Location: Right Arm, Patient Position: Sitting)   Pulse 77   Temp 98.6 F (37 C) (Oral)   Resp 18   Ht 5' 3"  (1.6 m)   Wt 195 lb 12.8 oz (88.8 kg)   SpO2 100%   BMI 34.68 kg/m   GENERAL:alert, no distress and comfortable.  SKIN: skin color, texture, turgor are normal, no rashes, the previously infected sebaceous cyst in the right buttock has resolved and healed well. No skin erythema. EYES: normal, conjunctiva are pink and non-injected, sclera clear OROPHARYNX:no  exudate, no erythema and lips, buccal mucosa, and tongue normal  NECK: supple, thyroid normal size, non-tender, without nodularity. (+) Incision in the right lower neck for port. Port appears clean, neck not significantly swollen, no erythema, no sign of infection. LYMPH:  no palpable lymphadenopathy in the cervical, axillary or inguinal LUNGS: clear to auscultation and percussion with normal breathing effort  HEART: regular rate & rhythm and no murmurs and no lower extremity edema ABDOMEN:abdomen soft, non-tender and normal bowel sounds MUSCULOSKELETALl:no cyanosis of digits and no clubbing  PSYCH: alert & oriented x 3 with fluent speech NEURO: no focal motor/sensory deficits Breasts: Breast inspection showed them to be symmetrical with no nipple discharge. Palpation of the breasts and axilla revealed no obvious mass that I could appreciate.   LABORATORY DATA:  I have reviewed the data as listed CBC Latest Ref Rng & Units 07/23/2016 07/02/2016 06/25/2016  WBC 3.9 - 10.3 10e3/uL 1.8(L) 2.6(L) 3.5(L)  Hemoglobin 11.6 - 15.9 g/dL 11.2(L) 10.5(L) 11.5(L)  Hematocrit 34.8 - 46.6 % 32.9(L) 30.5(L) 33.7(L)  Platelets 145 - 400 10e3/uL 164 125(L) 202   CMP Latest Ref Rng & Units 07/23/2016 07/02/2016 06/25/2016  Glucose 70 - 140 mg/dl 96 91 94  BUN 7.0 - 26.0 mg/dL 12.8 8.3 8.0  Creatinine 0.6 - 1.1 mg/dL 0.7 0.7 0.7  Sodium 136 - 145 mEq/L 142 142 141  Potassium 3.5 - 5.1 mEq/L 4.0 3.9 4.1  Chloride 101 - 111 mmol/L - - -  CO2 22 - 29 mEq/L 24 22 22   Calcium 8.4 - 10.4 mg/dL 9.1 9.3 9.2  Total Protein 6.4 - 8.3 g/dL 6.2(L) 6.3(L) 6.3(L)  Total Bilirubin 0.20 - 1.20 mg/dL 0.79 0.90 0.70  Alkaline Phos 40 - 150 U/L 127 85 88  AST 5 - 34 U/L 44(H) 31 29  ALT 0 - 55 U/L 61(H) 41 43    PATHOLOGY REPORT  Diagnosis 01/29/2016 1. Breast, left, needle core biopsy, 1:30 o'clock - INVASIVE DUCTAL CARCINOMA, GRADE 3. - LYMPHOVASCULAR INVOLVEMENT BY TUMOR. 2. Lymph node, needle/core biopsy, left  axillary - METASTATIC CARCINOMA.  RADIOGRAPHIC STUDIES: I have personally reviewed the radiological images as listed and agreed with the findings in the report. Mr Breast Bilateral W Wo Contrast  Result Date: 07/11/2016 CLINICAL DATA:  Left breast cancer. Assess for neoadjuvant therapy response. LABS:  Creatinine was obtained on site at [facility] at [address] GFR greater than 60. EXAM: BILATERAL BREAST MRI WITH AND WITHOUT CONTRAST TECHNIQUE: Multiplanar, multisequence MR images of both breasts were obtained prior to and following the intravenous administration of 17 ml of MultiHance. THREE-DIMENSIONAL MR IMAGE RENDERING ON INDEPENDENT WORKSTATION: Three-dimensional MR images were rendered by post-processing of the original MR data on an independent workstation. The three-dimensional MR images were interpreted, and findings are reported in the following complete MRI report for this study. Three dimensional images were evaluated at the independent DynaCad workstation COMPARISON:  Previous exam(s). FINDINGS: Breast composition: c. Heterogeneous fibroglandular tissue. Background parenchymal enhancement: Minimal. Right breast: No mass or abnormal enhancement. Left breast: No mass or abnormal enhancement. Biopsy clip is identified at the posterior left breast upper outer quadrant. The previously noted enhancing mass is not seen currently. Lymph nodes: No abnormal appearing lymph nodes. Previously noted abnormal lymph nodes in the left axilla are currently normal size. Ancillary findings:  None. IMPRESSION: Known cancer. RECOMMENDATION: Treatment plan. BI-RADS CATEGORY  6: Known biopsy-proven malignancy. Electronically Signed   By: Abelardo Diesel M.D.   On: 07/11/2016 10:29    Bone scan 02/12/2016 IMPRESSION: No evidence of skeletal metastatic disease.  CT chest abdomen pelvis w contrast 02/12/2016 IMPRESSION: 2.4 x 2.7 cm lesion in the upper outer left breast, corresponding to the  patient's known breast neoplasm. Left axillary nodal metastasis measuring up to 13 mm short axis. No evidence of distant metastasis.  ASSESSMENT & PLAN: 56 y.o.  Caucasian woman with past medical history of depression, presented with a screening mammogram discovered left breast cancer   1. Malignant neoplasm of upper-outer quadrant of left breast, invasive ductal carcinoma, G3, cT2N1M0, stage IIB, ER-/PR-/HER2- -I previously reviewed her imaging findings and the biopsy results in great detail with patient and her husband. -I previously discussed her breast MRI, CT scan and bone scan findings, which all confirmed the left breast lesion and axillary adenopathy, no other distant metastasis. -she was previously seen by Dr. Donne Hazel and surgical options were discussed  -We previously discussed that triple negative breast cancers are much more aggressive, and her risk of cancer recurrence after breast surgery is pretty high, giving the multiple positive lymph nodes. I recommend her to consider neoadjuvant chemotherapy, to reduce her risk of recurrence, and downstage her breast cancer, to make lumpectomy and sentinel lymph node biopsy as a more feasible surgery. -I previously recommended her to have dose dense Adriamycin, Cytoxan every 2 weeks, with Neulasta support, for 4 cycles, followed by weekly carboplatin and Taxol for 12 weeks  -the goal of therapy is curative  -Due to her underlying NOSD, and existing peripheral neuropathy, my main concern of long-term side effects from chemotherapy is worsening neuropathy, especially from Taxol, we will monitor closely, and may need to hold it if her neuropathy gets worse.  -Her previous baseline echocardiogram was normal. -Patient is tolerating chemotherapy well, left breast mass is not palpable anymore, she is clinically responding to chemotherapy, we'll continue chemotherapy. -Lab previously reviewed. Mild pancytopenia, adequate for treatment, we proceeded with  12th week carbo and Taxol  -She was seen by her breast surgeon Dr. Donne Hazel yesterday. If she has good response, she is likely a candidate for lumpectomy and sentinel lymph node biopsy. She initially referred mastectomy, and agreed with lumpectomy after discussion with Dr. Donne Hazel again yesterday. -Her repeated breast MRI is scheduled for 5/11 -we previously discussed the role of adjuvant chemo. Due to her underline NOSD, she would not be a candidate for our immunotherapy adjuvant trial. I do not plan to give her her adjuvant IV chemotherapy, but would consider oral Xeloda if she has significant residual disease. -Okay to remove her port during her breast surgery. -MRI reviewed, she has had complete radiographic response.  -We discussed is no tumor left then she will not need further chemotherapy. But if significant residual cancer or positive lymph nodes I suggest using Xeloda after she heals from surgery.  Will see her back June 15th to plan for next treatment.  -labs reviewed and her levels have not completely recovered but adequate for surgery.  -I'll refer her back to see Dr. Lisbeth Renshaw after surgery for adjuvant breast and axillary radiation.  2. Genetics -Given her young age and triple negative disease, we recommend her to have genetic testing to rule out inheritable breast cancer syndrome, and the test result may impact her surgery. -ATM c.2606C>T VUS identified on the Breast/Gyn panel.  Negative genetic testing for the MSH2 inversion analysis ([place] inversion). The Breast/GYN gene panel offered by [facility] includes sequencing and rearrangement analysis for the following 23 genes:  ATM, BARD1, BRCA1, BRCA2, BRIP1, CDH1, CHEK2, EPCAM, FANCC, MLH1, MSH2, MSH6, MUTYH, NBN, NF1, PALB2, PMS2, POLD1, PTEN, RAD51C, RAD51D, RECQL, and TP53.   The report date is March 27, 2016  3. neuromyelitis optica spectrum disorder (NOSD) -she will continue follow up with her neurologist Dr. Moshe Cipro at [facility]  -she is  on rituximab every 4 weeks. It will be OK to continue  her Rituximab when she is on chemotherapy, but the combination therapy will further compromise her immune system. -I have previously spoken with Dr. Moshe Cipro about her breast cancer treatment, he is agreeable. -She previously received Rituxan in our cancer center on 03/21/2016, tolerated generally well. She prefers to get next dose Rituxan here with [place], which is scheduled for 07/11/2016  4. Peripheral neuropathy, G1 -Her tingling and numbness got slightly worse towards the end of chemotherapy, likely related to her chemotherapy -No need to Neurontin at this point, continue monitoring.  5. Anemia secondary to chemo -overall mild and stable -We'll monitor closely  Plan -Rad/onc referral Lab and f/u on 6/15    No orders of the defined types were placed in this encounter.   All questions were answered. The patient knows to call the clinic with any problems, questions or concerns.  I spent 20 minutes counseling the patient face to face. The total time spent in the appointment was 25 minutes and more than 50% was on counseling.  This document serves as a record of services personally performed by Truitt Merle, MD. It was created on her behalf by Joslyn Devon, a trained medical scribe. The creation of this record is based on the scribe's personal observations and the provider's statements to them. This document has been checked and approved by the attending provider.     Truitt Merle, MD 07/23/2016

## 2016-07-23 ENCOUNTER — Telehealth: Payer: Self-pay | Admitting: Hematology

## 2016-07-23 ENCOUNTER — Encounter: Payer: Self-pay | Admitting: Hematology

## 2016-07-23 ENCOUNTER — Encounter (HOSPITAL_BASED_OUTPATIENT_CLINIC_OR_DEPARTMENT_OTHER): Payer: Self-pay | Admitting: *Deleted

## 2016-07-23 ENCOUNTER — Ambulatory Visit (HOSPITAL_BASED_OUTPATIENT_CLINIC_OR_DEPARTMENT_OTHER): Payer: 59 | Admitting: Hematology

## 2016-07-23 ENCOUNTER — Encounter: Payer: Self-pay | Admitting: Radiation Oncology

## 2016-07-23 ENCOUNTER — Other Ambulatory Visit (HOSPITAL_BASED_OUTPATIENT_CLINIC_OR_DEPARTMENT_OTHER): Payer: 59

## 2016-07-23 VITALS — BP 127/71 | HR 77 | Temp 98.6°F | Resp 18 | Ht 63.0 in | Wt 195.8 lb

## 2016-07-23 DIAGNOSIS — G629 Polyneuropathy, unspecified: Secondary | ICD-10-CM | POA: Diagnosis not present

## 2016-07-23 DIAGNOSIS — C50412 Malignant neoplasm of upper-outer quadrant of left female breast: Secondary | ICD-10-CM

## 2016-07-23 DIAGNOSIS — C773 Secondary and unspecified malignant neoplasm of axilla and upper limb lymph nodes: Secondary | ICD-10-CM | POA: Diagnosis not present

## 2016-07-23 DIAGNOSIS — Z171 Estrogen receptor negative status [ER-]: Secondary | ICD-10-CM | POA: Diagnosis not present

## 2016-07-23 DIAGNOSIS — G360 Neuromyelitis optica [Devic]: Secondary | ICD-10-CM

## 2016-07-23 DIAGNOSIS — D6481 Anemia due to antineoplastic chemotherapy: Secondary | ICD-10-CM | POA: Diagnosis not present

## 2016-07-23 LAB — CBC WITH DIFFERENTIAL/PLATELET
BASO%: 1.8 % (ref 0.0–2.0)
Basophils Absolute: 0 10*3/uL (ref 0.0–0.1)
EOS%: 1.4 % (ref 0.0–7.0)
Eosinophils Absolute: 0 10*3/uL (ref 0.0–0.5)
HCT: 32.9 % — ABNORMAL LOW (ref 34.8–46.6)
HGB: 11.2 g/dL — ABNORMAL LOW (ref 11.6–15.9)
LYMPH%: 20.8 % (ref 14.0–49.7)
MCH: 33.5 pg (ref 25.1–34.0)
MCHC: 33.9 g/dL (ref 31.5–36.0)
MCV: 98.9 fL (ref 79.5–101.0)
MONO#: 0.2 10*3/uL (ref 0.1–0.9)
MONO%: 13.8 % (ref 0.0–14.0)
NEUT#: 1.1 10*3/uL — ABNORMAL LOW (ref 1.5–6.5)
NEUT%: 62.2 % (ref 38.4–76.8)
Platelets: 164 10*3/uL (ref 145–400)
RBC: 3.33 10*6/uL — ABNORMAL LOW (ref 3.70–5.45)
RDW: 15.9 % — ABNORMAL HIGH (ref 11.2–14.5)
WBC: 1.8 10*3/uL — ABNORMAL LOW (ref 3.9–10.3)
lymph#: 0.4 10*3/uL — ABNORMAL LOW (ref 0.9–3.3)

## 2016-07-23 LAB — COMPREHENSIVE METABOLIC PANEL
ALT: 61 U/L — ABNORMAL HIGH (ref 0–55)
AST: 44 U/L — ABNORMAL HIGH (ref 5–34)
Albumin: 3.9 g/dL (ref 3.5–5.0)
Alkaline Phosphatase: 127 U/L (ref 40–150)
Anion Gap: 10 mEq/L (ref 3–11)
BUN: 12.8 mg/dL (ref 7.0–26.0)
CO2: 24 mEq/L (ref 22–29)
Calcium: 9.1 mg/dL (ref 8.4–10.4)
Chloride: 109 mEq/L (ref 98–109)
Creatinine: 0.7 mg/dL (ref 0.6–1.1)
EGFR: >90 mL/min/{1.73_m2} (ref >90)
Glucose: 96 mg/dl (ref 70–140)
Potassium: 4 mEq/L (ref 3.5–5.1)
Sodium: 142 mEq/L (ref 136–145)
Total Bilirubin: 0.79 mg/dL (ref 0.20–1.20)
Total Protein: 6.2 g/dL — ABNORMAL LOW (ref 6.4–8.3)

## 2016-07-23 NOTE — Telephone Encounter (Signed)
Rad/Onc appointment scheduled for 08/14/16. (s/w Melanie) Lab and follow up with Dr Burr Medico, was scheduled for 08/15/16, per 07/23/16 los. Patient was given a copy of the AVS report and appointment schedule, per 07/23/16 los.

## 2016-07-30 ENCOUNTER — Ambulatory Visit
Admission: RE | Admit: 2016-07-30 | Discharge: 2016-07-30 | Disposition: A | Discharge status: Home / Self Care | Payer: 59 | Source: Ambulatory Visit | Attending: General Surgery | Admitting: General Surgery

## 2016-07-30 ENCOUNTER — Other Ambulatory Visit: Payer: Self-pay | Admitting: General Surgery

## 2016-07-30 DIAGNOSIS — C50412 Malignant neoplasm of upper-outer quadrant of left female breast: Secondary | ICD-10-CM

## 2016-07-30 DIAGNOSIS — Z170 Estrogen receptor positive status [ER+]: Secondary | ICD-10-CM

## 2016-07-30 DIAGNOSIS — C50912 Malignant neoplasm of unspecified site of left female breast: Secondary | ICD-10-CM | POA: Diagnosis not present

## 2016-07-30 DIAGNOSIS — R590 Localized enlarged lymph nodes: Secondary | ICD-10-CM | POA: Diagnosis not present

## 2016-07-30 NOTE — Progress Notes (Signed)
Boost drink given for surgery 07/31/16, instructed to complete by [place], pt verbalized understanding.  Boost drink given for surgery 08/05/16, instructed pt to complete by [facility], pt verbalized understanding. Hibiclens soap given with instructions, pt verbalized understanding.

## 2016-07-31 ENCOUNTER — Encounter (HOSPITAL_BASED_OUTPATIENT_CLINIC_OR_DEPARTMENT_OTHER): Payer: Self-pay

## 2016-07-31 ENCOUNTER — Ambulatory Visit (HOSPITAL_BASED_OUTPATIENT_CLINIC_OR_DEPARTMENT_OTHER)
Admission: RE | Admit: 2016-07-31 | Discharge: 2016-07-31 | Disposition: A | Discharge status: Home / Self Care | Payer: 59 | Source: Ambulatory Visit | Attending: General Surgery | Admitting: General Surgery

## 2016-07-31 ENCOUNTER — Encounter (HOSPITAL_BASED_OUTPATIENT_CLINIC_OR_DEPARTMENT_OTHER)
Admission: RE | Disposition: A | Discharge status: Home / Self Care | Payer: Self-pay | Source: Ambulatory Visit | Attending: General Surgery

## 2016-07-31 ENCOUNTER — Ambulatory Visit (HOSPITAL_BASED_OUTPATIENT_CLINIC_OR_DEPARTMENT_OTHER): Payer: 59 | Admitting: Certified Registered Nurse Anesthetist

## 2016-07-31 ENCOUNTER — Ambulatory Visit
Admit: 2016-07-31 | Discharge: 2016-07-31 | Disposition: A | Discharge status: Home / Self Care | Payer: 59 | Attending: General Surgery | Admitting: General Surgery

## 2016-07-31 ENCOUNTER — Ambulatory Visit
Admission: RE | Admit: 2016-07-31 | Discharge: 2016-07-31 | Disposition: A | Discharge status: Home / Self Care | Payer: 59 | Source: Ambulatory Visit | Attending: General Surgery | Admitting: General Surgery

## 2016-07-31 ENCOUNTER — Ambulatory Visit (HOSPITAL_COMMUNITY)
Admission: RE | Admit: 2016-07-31 | Discharge: 2016-07-31 | Disposition: A | Discharge status: Home / Self Care | Payer: 59 | Source: Ambulatory Visit | Attending: General Surgery | Admitting: General Surgery

## 2016-07-31 DIAGNOSIS — Z170 Estrogen receptor positive status [ER+]: Secondary | ICD-10-CM

## 2016-07-31 DIAGNOSIS — C50412 Malignant neoplasm of upper-outer quadrant of left female breast: Secondary | ICD-10-CM

## 2016-07-31 DIAGNOSIS — C50912 Malignant neoplasm of unspecified site of left female breast: Secondary | ICD-10-CM | POA: Diagnosis not present

## 2016-07-31 DIAGNOSIS — Z87891 Personal history of nicotine dependence: Secondary | ICD-10-CM | POA: Diagnosis not present

## 2016-07-31 DIAGNOSIS — D242 Benign neoplasm of left breast: Secondary | ICD-10-CM | POA: Diagnosis not present

## 2016-07-31 DIAGNOSIS — F419 Anxiety disorder, unspecified: Secondary | ICD-10-CM | POA: Diagnosis not present

## 2016-07-31 DIAGNOSIS — Z6834 Body mass index (BMI) 34.0-34.9, adult: Secondary | ICD-10-CM | POA: Insufficient documentation

## 2016-07-31 DIAGNOSIS — Z90710 Acquired absence of both cervix and uterus: Secondary | ICD-10-CM | POA: Insufficient documentation

## 2016-07-31 DIAGNOSIS — Z8249 Family history of ischemic heart disease and other diseases of the circulatory system: Secondary | ICD-10-CM | POA: Insufficient documentation

## 2016-07-31 DIAGNOSIS — Z800 Family history of malignant neoplasm of digestive organs: Secondary | ICD-10-CM | POA: Diagnosis not present

## 2016-07-31 DIAGNOSIS — Z9221 Personal history of antineoplastic chemotherapy: Secondary | ICD-10-CM | POA: Insufficient documentation

## 2016-07-31 DIAGNOSIS — Z801 Family history of malignant neoplasm of trachea, bronchus and lung: Secondary | ICD-10-CM | POA: Diagnosis not present

## 2016-07-31 DIAGNOSIS — Z452 Encounter for adjustment and management of vascular access device: Secondary | ICD-10-CM | POA: Insufficient documentation

## 2016-07-31 DIAGNOSIS — Z803 Family history of malignant neoplasm of breast: Secondary | ICD-10-CM | POA: Diagnosis not present

## 2016-07-31 DIAGNOSIS — E669 Obesity, unspecified: Secondary | ICD-10-CM | POA: Insufficient documentation

## 2016-07-31 DIAGNOSIS — G8918 Other acute postprocedural pain: Secondary | ICD-10-CM | POA: Diagnosis not present

## 2016-07-31 DIAGNOSIS — D360 Benign neoplasm of lymph nodes: Secondary | ICD-10-CM | POA: Diagnosis not present

## 2016-07-31 DIAGNOSIS — D649 Anemia, unspecified: Secondary | ICD-10-CM | POA: Diagnosis not present

## 2016-07-31 DIAGNOSIS — N6012 Diffuse cystic mastopathy of left breast: Secondary | ICD-10-CM | POA: Diagnosis not present

## 2016-07-31 HISTORY — PX: PORT-A-CATH REMOVAL: SHX5289

## 2016-07-31 HISTORY — PX: BREAST LUMPECTOMY WITH RADIOACTIVE SEED AND SENTINEL LYMPH NODE BIOPSY: SHX6550

## 2016-07-31 SURGERY — BREAST LUMPECTOMY WITH RADIOACTIVE SEED AND SENTINEL LYMPH NODE BIOPSY
Anesthesia: General | Site: Chest | Laterality: Right

## 2016-07-31 MED ORDER — LIDOCAINE 2% (20 MG/ML) 5 ML SYRINGE
INTRAMUSCULAR | Status: AC
  Filled 2016-07-31: qty 5

## 2016-07-31 MED ORDER — BUPIVACAINE-EPINEPHRINE (PF) 0.5% -1:200000 IJ SOLN
INTRAMUSCULAR | Status: DC | PRN
  Administered 2016-07-31: 30 mL via PERINEURAL

## 2016-07-31 MED ORDER — FENTANYL CITRATE (PF) 100 MCG/2ML IJ SOLN
INTRAMUSCULAR | Status: AC
  Filled 2016-07-31: qty 2

## 2016-07-31 MED ORDER — PROPOFOL 10 MG/ML IV BOLUS
INTRAVENOUS | Status: DC | PRN
  Administered 2016-07-31: 200 mg via INTRAVENOUS

## 2016-07-31 MED ORDER — LACTATED RINGERS IV SOLN
INTRAVENOUS | Status: DC
  Administered 2016-07-31: 13:00:00 via INTRAVENOUS

## 2016-07-31 MED ORDER — PROPOFOL 10 MG/ML IV BOLUS
INTRAVENOUS | Status: AC
  Filled 2016-07-31: qty 20

## 2016-07-31 MED ORDER — GABAPENTIN 300 MG PO CAPS
300.0000 mg | ORAL_CAPSULE | ORAL | Status: AC
  Administered 2016-07-31: 300 mg via ORAL

## 2016-07-31 MED ORDER — MIDAZOLAM HCL 2 MG/2ML IJ SOLN
INTRAMUSCULAR | Status: AC
  Filled 2016-07-31: qty 2

## 2016-07-31 MED ORDER — GABAPENTIN 300 MG PO CAPS
ORAL_CAPSULE | ORAL | Status: AC
  Filled 2016-07-31: qty 1

## 2016-07-31 MED ORDER — ONDANSETRON HCL 4 MG/2ML IJ SOLN
INTRAMUSCULAR | Status: DC | PRN
  Administered 2016-07-31: 4 mg via INTRAVENOUS

## 2016-07-31 MED ORDER — MIDAZOLAM HCL 2 MG/2ML IJ SOLN
1.0000 mg | INTRAMUSCULAR | Status: DC | PRN
  Administered 2016-07-31: 2 mg via INTRAVENOUS

## 2016-07-31 MED ORDER — PROMETHAZINE HCL 25 MG/ML IJ SOLN: 6.2500 mg | INTRAMUSCULAR | Status: DC | PRN

## 2016-07-31 MED ORDER — ACETAMINOPHEN 500 MG PO TABS
1000.0000 mg | ORAL_TABLET | ORAL | Status: AC
  Administered 2016-07-31: 1000 mg via ORAL

## 2016-07-31 MED ORDER — TECHNETIUM TC 99M SULFUR COLLOID FILTERED
1.0000 | Freq: Once | INTRAVENOUS | Status: AC | PRN
  Administered 2016-07-31: 1 via INTRADERMAL

## 2016-07-31 MED ORDER — ONDANSETRON HCL 4 MG/2ML IJ SOLN
INTRAMUSCULAR | Status: AC
  Filled 2016-07-31: qty 2

## 2016-07-31 MED ORDER — CEFAZOLIN SODIUM-DEXTROSE 2-4 GM/100ML-% IV SOLN
2.0000 g | INTRAVENOUS | Status: AC
  Administered 2016-07-31: 2 g via INTRAVENOUS

## 2016-07-31 MED ORDER — ACETAMINOPHEN 500 MG PO TABS
ORAL_TABLET | ORAL | Status: AC
  Filled 2016-07-31: qty 2

## 2016-07-31 MED ORDER — DEXAMETHASONE SODIUM PHOSPHATE 10 MG/ML IJ SOLN
INTRAMUSCULAR | Status: AC
  Filled 2016-07-31: qty 1

## 2016-07-31 MED ORDER — OXYCODONE-ACETAMINOPHEN 10-325 MG PO TABS: 1.0000 | ORAL_TABLET | Freq: Four times a day (QID) | ORAL | 0 refills | Status: DC | PRN

## 2016-07-31 MED ORDER — LIDOCAINE 2% (20 MG/ML) 5 ML SYRINGE
INTRAMUSCULAR | Status: DC | PRN
  Administered 2016-07-31: 50 mg via INTRAVENOUS

## 2016-07-31 MED ORDER — KETOROLAC TROMETHAMINE 15 MG/ML IJ SOLN
15.0000 mg | Freq: Once | INTRAMUSCULAR | Status: AC
  Administered 2016-07-31: 15 mg via INTRAVENOUS

## 2016-07-31 MED ORDER — FENTANYL CITRATE (PF) 100 MCG/2ML IJ SOLN
50.0000 ug | INTRAMUSCULAR | Status: AC | PRN
  Administered 2016-07-31 (×3): 50 ug via INTRAVENOUS

## 2016-07-31 MED ORDER — SODIUM CHLORIDE 0.9 % IJ SOLN
INTRAVENOUS | Status: DC | PRN
  Administered 2016-07-31: 4 mL via INTRAMUSCULAR

## 2016-07-31 MED ORDER — BUPIVACAINE HCL (PF) 0.25 % IJ SOLN
INTRAMUSCULAR | Status: DC | PRN
  Administered 2016-07-31: 8.5 mL

## 2016-07-31 MED ORDER — FENTANYL CITRATE (PF) 100 MCG/2ML IJ SOLN
25.0000 ug | INTRAMUSCULAR | Status: DC | PRN
  Administered 2016-07-31 (×2): 50 ug via INTRAVENOUS

## 2016-07-31 MED ORDER — CEFAZOLIN SODIUM-DEXTROSE 2-4 GM/100ML-% IV SOLN
INTRAVENOUS | Status: AC
  Filled 2016-07-31: qty 100

## 2016-07-31 MED ORDER — SCOPOLAMINE 1 MG/3DAYS TD PT72: 1.0000 | MEDICATED_PATCH | Freq: Once | TRANSDERMAL | Status: DC | PRN

## 2016-07-31 MED ORDER — DEXAMETHASONE SODIUM PHOSPHATE 4 MG/ML IJ SOLN
INTRAMUSCULAR | Status: DC | PRN
  Administered 2016-07-31: 10 mg via INTRAVENOUS

## 2016-07-31 SURGICAL SUPPLY — 48 items
BINDER BREAST XXLRG (GAUZE/BANDAGES/DRESSINGS) ×4 IMPLANT
BLADE SURG 15 STRL LF DISP TIS (BLADE) ×2 IMPLANT
BLADE SURG 15 STRL SS (BLADE) ×2
CANISTER SUCT 1200ML W/VALVE (MISCELLANEOUS) ×4 IMPLANT
CHLORAPREP W/TINT 26ML (MISCELLANEOUS) ×4 IMPLANT
CLIP TI WIDE RED SMALL 6 (CLIP) ×4 IMPLANT
CLOSURE WOUND 1/2 X4 (GAUZE/BANDAGES/DRESSINGS) ×1
COVER BACK TABLE 60X90IN (DRAPES) ×4 IMPLANT
COVER MAYO STAND STRL (DRAPES) ×4 IMPLANT
COVER PROBE W GEL 5X96 (DRAPES) ×4 IMPLANT
DECANTER SPIKE VIAL GLASS SM (MISCELLANEOUS) ×4 IMPLANT
DERMABOND ADVANCED (GAUZE/BANDAGES/DRESSINGS) ×2
DERMABOND ADVANCED .7 DNX12 (GAUZE/BANDAGES/DRESSINGS) ×2 IMPLANT
DEVICE DUBIN W/COMP PLATE 8390 (MISCELLANEOUS) ×8 IMPLANT
DRAPE LAPAROSCOPIC ABDOMINAL (DRAPES) ×4 IMPLANT
DRAPE LAPAROTOMY 100X72 PEDS (DRAPES) ×4 IMPLANT
DRAPE UTILITY XL STRL (DRAPES) ×4 IMPLANT
ELECT COATED BLADE 2.86 ST (ELECTRODE) ×4 IMPLANT
ELECT REM PT RETURN 9FT ADLT (ELECTROSURGICAL) ×4
ELECTRODE REM PT RTRN 9FT ADLT (ELECTROSURGICAL) ×2 IMPLANT
GLOVE BIO SURGEON STRL SZ7 (GLOVE) ×12 IMPLANT
GLOVE BIOGEL PI IND STRL 7.5 (GLOVE) ×4 IMPLANT
GLOVE BIOGEL PI INDICATOR 7.5 (GLOVE) ×4
GOWN STRL REUS W/ TWL LRG LVL3 (GOWN DISPOSABLE) ×4 IMPLANT
GOWN STRL REUS W/TWL LRG LVL3 (GOWN DISPOSABLE) ×4
ILLUMINATOR WAVEGUIDE N/F (MISCELLANEOUS) ×4 IMPLANT
KIT MARKER MARGIN INK (KITS) ×4 IMPLANT
NDL SAFETY ECLIPSE 18X1.5 (NEEDLE) ×2 IMPLANT
NEEDLE HYPO 18GX1.5 SHARP (NEEDLE) ×2
NEEDLE HYPO 25X1 1.5 SAFETY (NEEDLE) ×8 IMPLANT
PACK BASIN DAY SURGERY FS (CUSTOM PROCEDURE TRAY) ×4 IMPLANT
PENCIL BUTTON HOLSTER BLD 10FT (ELECTRODE) ×4 IMPLANT
SLEEVE SCD COMPRESS KNEE MED (MISCELLANEOUS) ×4 IMPLANT
SPONGE LAP 4X18 X RAY DECT (DISPOSABLE) ×8 IMPLANT
STRIP CLOSURE SKIN 1/2X4 (GAUZE/BANDAGES/DRESSINGS) ×3 IMPLANT
SUT MNCRL AB 4-0 PS2 18 (SUTURE) ×8 IMPLANT
SUT MON AB 4-0 PC3 18 (SUTURE) ×4 IMPLANT
SUT SILK 2 0 SH (SUTURE) ×4 IMPLANT
SUT VIC AB 2-0 SH 27 (SUTURE) ×6
SUT VIC AB 2-0 SH 27XBRD (SUTURE) ×6 IMPLANT
SUT VIC AB 3-0 SH 27 (SUTURE) ×4
SUT VIC AB 3-0 SH 27X BRD (SUTURE) ×4 IMPLANT
SYR CONTROL 10ML LL (SYRINGE) ×8 IMPLANT
TOWEL OR 17X24 6PK STRL BLUE (TOWEL DISPOSABLE) ×4 IMPLANT
TOWEL OR NON WOVEN STRL DISP B (DISPOSABLE) ×4 IMPLANT
TUBE CONNECTING 20'X1/4 (TUBING) ×1
TUBE CONNECTING 20X1/4 (TUBING) ×3 IMPLANT
YANKAUER SUCT BULB TIP NO VENT (SUCTIONS) ×4 IMPLANT

## 2016-07-31 NOTE — Anesthesia Procedure Notes (Signed)
Procedure Name: LMA Insertion Performed by: Jude Naclerio W Pre-anesthesia Checklist: Patient identified, Emergency Drugs available, Suction available and Patient being monitored Patient Re-evaluated:Patient Re-evaluated prior to inductionOxygen Delivery Method: Circle system utilized Preoxygenation: Pre-oxygenation with 100% oxygen Intubation Type: IV induction Ventilation: Mask ventilation without difficulty LMA: LMA inserted LMA Size: 4.0 Number of attempts: 1 Placement Confirmation: positive ETCO2 Tube secured with: Tape Dental Injury: Teeth and Oropharynx as per pre-operative assessment        

## 2016-07-31 NOTE — Progress Notes (Signed)
Assisted Dr. Turk with left, ultrasound guided, pectoralis block. Side rails up, monitors on throughout procedure. See vital signs in flow sheet. Tolerated Procedure well. 

## 2016-07-31 NOTE — Transfer of Care (Signed)
Immediate Anesthesia Transfer of Care Note  Patient: Grace Moyer  Procedure(s) Performed: Procedure(s): LEFT BREAST RADIOACTIVE SEED GUIDED LUMPECTOMY WITH LEFT RADIOACTIVE SEED TARGETED AXILLARY SENTINEL LYMPH NODE EXCISION AND SENTINEL LYMPH NODE BIOPSY (Left) REMOVAL PORT-A-CATH (Right)  Patient Location: PACU  Anesthesia Type:General  Level of Consciousness: awake and sedated  Airway & Oxygen Therapy: Patient Spontanous Breathing and Patient connected to face mask oxygen  Post-op Assessment: Report given to RN and Post -op Vital signs reviewed and stable  Post vital signs: Reviewed and stable  Last Vitals:  Vitals:   07/31/16 1340 07/31/16 1520  BP:  114/73  Pulse: 89 71  Resp: (!) 21 13  Temp:      Last Pain:  Vitals:   07/31/16 1230  TempSrc: Oral         Complications: No apparent anesthesia complications

## 2016-07-31 NOTE — Op Note (Signed)
Preoperative diagnosis: Leftbreast cancer s/p primary chemotherapy Postoperative diagnosis: same as above Procedure: 1. Leftbreast seedguided lumpectomy 2. Leftdeep axillary sentinel node biopsy and excision of seed containing axillary node (targeted axillary node dissection) 3. Injection blue dye for sn identification 4. Port removal Surgeon Dr Serita Grammes Anes general  EBL: 50 cc Comps none Specimen:  1. Leftbreast marked with paint containing seedand clip 2. Leftaxillary sentinel nodes with highest count 139 3. Additional medial margins marked short superior, long lateral, double deep 4. Left axillary seed containing node (this was also a sentinel node) Sponge count correct at completion Dispoto recovery stable  Indications: This is a 18 yof who has left breast cancer treated with primary chemotherapy with complete imaging response.  This includes breast and nodes. We discussed options and have elected to proceed with TAD and lumpectomy. Will also remove port.    Procedure: After informed consent was obtained the patient was taken to the OR. She was injected with technetium in the standard periareolar fashion.She was given anitibiotics. SCDs were in place. She was prepped and draped in the standard sterile surgical fashion. A timeout was performed. She had undergone a pectoral block.  I infiltrated a mixture of saline and blue dye in the periareolar area and massaged this. I first removed the port. I infiltrated marcaine and then reentered the old incision. I removed the port, suture material and line in their entirety.  This was hemostatic. I closed with 3-0 vicryl, 4-0 monocryl and glue. I then located the seed in the uoq.  I made a curvilinear incision in the uoq that I did both the lumpectomy and nodal surgery through.  I tunneled to the seed in the breast using the lighted retractors.  I then removed the seed and the surrounding tissue. This waspassed off the table  after marking with paint.I did confirm removal of theclipand seedwith mammography. I elected to remove some more medial margin as above.  I then entered the axilla from the same incision. I identified the seed containing node which was also a sentinel node and excised this.  I confirmed removal of the clip and the seed with mammogram. I identified the sentinel nodes also with the highest count as above. There was no gross nodal disease.There was no background radioactivity or blue dye present. I obtained hemostasis. I closed the axillary fascia and breast tissue with 2-0 vicryl. I closed the axilla to separate from the breast cavity.  I then closed the dermis with 3-0 vicryl and the skin with 4-0 monocryl.  Glue and steristrips were placed A breast binder was placed. She was extubated and transferred to recovery stable

## 2016-07-31 NOTE — Interval H&P Note (Signed)
History and Physical Interval Note:  07/31/2016 1:25 PM  Grace Moyer  has presented today for surgery, with the diagnosis of LEFT BREAST CANCER  The various methods of treatment have been discussed with the patient and family. After consideration of risks, benefits and other options for treatment, the patient has consented to  Procedure(s): LEFT BREAST RADIOACTIVE SEED GUIDED LUMPECTOMY WITH LEFT RADIOACTIVE SEED TARGETED AXILLARY SENTINEL LYMPH NODE EXCISION AND SENTINEL LYMPH NODE BIOPSY(TAD) (Left) REMOVAL PORT-A-CATH (N/A) as a surgical intervention .  The patient's history has been reviewed, patient examined, no change in status, stable for surgery.  I have reviewed the patient's chart and labs.  Questions were answered to the patient's satisfaction.     Blaire Palomino

## 2016-07-31 NOTE — Anesthesia Procedure Notes (Signed)
Anesthesia Regional Block: Pectoralis block   Pre-Anesthetic Checklist: ,, timeout performed, Correct Patient, Correct Site, Correct Laterality, Correct Procedure, Correct Position, site marked, Risks and benefits discussed,  Surgical consent,  Pre-op evaluation,  At surgeon's request and post-op pain management  Laterality: Left  Prep: chloraprep       Needles:  Injection technique: Single-shot  Needle Type: Echogenic Needle     Needle Length: 9cm  Needle Gauge: 21     Additional Needles:   Procedures: ultrasound guided,,,,,,,,  Narrative:  Start time: 07/31/2016 1:15 PM End time: 07/31/2016 1:22 PM Injection made incrementally with aspirations every 5 mL.  Performed by: Personally  Anesthesiologist: Catalina Gravel  Additional Notes: No pain on injection. No increased resistance to injection. Injection made in 5cc increments.  Good needle visualization.  Patient tolerated procedure well.

## 2016-07-31 NOTE — Anesthesia Preprocedure Evaluation (Addendum)
Anesthesia Evaluation  Patient identified by MRN, date of birth, ID band Patient awake    Reviewed: Allergy & Precautions, NPO status , Patient's Chart, lab work & pertinent test results  Airway Mallampati: II  TM Distance: >3 FB Neck ROM: Full    Dental  (+) Teeth Intact, Dental Advisory Given   Pulmonary former smoker,    Pulmonary exam normal breath sounds clear to auscultation       Cardiovascular Exercise Tolerance: Good negative cardio ROS Normal cardiovascular exam Rhythm:Regular Rate:Normal     Neuro/Psych PSYCHIATRIC DISORDERS Anxiety Neuromyelitis optica--BUE paresthesias    GI/Hepatic negative GI ROS, Neg liver ROS,   Endo/Other  Obesity   Renal/GU negative Renal ROS     Musculoskeletal negative musculoskeletal ROS (+)   Abdominal   Peds  Hematology  (+) Blood dyscrasia, anemia ,   Anesthesia Other Findings Day of surgery medications reviewed with the patient.  Left breast cancer  Reproductive/Obstetrics                            Anesthesia Physical Anesthesia Plan  ASA: III  Anesthesia Plan: General   Post-op Pain Management:  Regional for Post-op pain   Induction: Intravenous  Airway Management Planned: LMA  Additional Equipment:   Intra-op Plan:   Post-operative Plan: Extubation in OR  Informed Consent: I have reviewed the patients History and Physical, chart, labs and discussed the procedure including the risks, benefits and alternatives for the proposed anesthesia with the patient or authorized representative who has indicated his/her understanding and acceptance.   Dental advisory given  Plan Discussed with: CRNA  Anesthesia Plan Comments: (Risks/benefits of general anesthesia discussed with patient including risk of damage to teeth, lips, gum, and tongue, nausea/vomiting, allergic reactions to medications, and the possibility of heart attack, stroke and  death.  All patient questions answered.  Patient wishes to proceed.)       Anesthesia Quick Evaluation

## 2016-07-31 NOTE — Discharge Instructions (Signed)
Post Anesthesia Home Care Instructions  Activity: Get plenty of rest for the remainder of the day. A responsible individual must stay with you for 24 hours following the procedure.  For the next 24 hours, DO NOT: -Drive a car -Paediatric nurse -Drink alcoholic beverages -Take any medication unless instructed by your physician -Make any legal decisions or sign important papers.  Meals: Start with liquid foods such as gelatin or soup. Progress to regular foods as tolerated. Avoid greasy, spicy, heavy foods. If nausea and/or vomiting occur, drink only clear liquids until the nausea and/or vomiting subsides. Call your physician if vomiting continues.  Special Instructions/Symptoms: Your throat may feel dry or sore from the anesthesia or the breathing tube placed in your throat during surgery. If this causes discomfort, gargle with warm salt water. The discomfort should disappear within 24 hours.  If you had a scopolamine patch placed behind your ear for the management of post- operative nausea and/or vomiting:  1. The medication in the patch is effective for 72 hours, after which it should be removed.  Wrap patch in a tissue and discard in the trash. Wash hands thoroughly with soap and water. 2. You may remove the patch earlier than 72 hours if you experience unpleasant side effects which may include dry mouth, dizziness or visual disturbances. 3. Avoid touching the patch. Wash your hands with soap and water after contact with the patch.    Call your surgeon if you experience:   1.  Fever over 101.0. 2.  Inability to urinate. 3.  Nausea and/or vomiting. 4.  Extreme swelling or bruising at the surgical site. 5.  Continued bleeding from the incision. 6.  Increased pain, redness or drainage from the incision. 7.  Problems related to your pain medication. 8.  Any problems and/or concerns  [employer] Phone Number [phone]   POST OP INSTRUCTIONS  Always  review your discharge instruction sheet given to you by the facility where your surgery was performed.  IF YOU HAVE DISABILITY OR FAMILY LEAVE FORMS, YOU MUST BRING THEM TO THE OFFICE FOR PROCESSING.  DO NOT GIVE THEM TO YOUR DOCTOR.  1. A prescription for pain medication may be given to you upon discharge.  Take your pain medication as prescribed, if needed.  If narcotic pain medicine is not needed, then you may take acetaminophen (Tylenol), naprosyn (Alleve) or ibuprofen (Advil) as needed. 2. Take your usually prescribed medications unless otherwise directed 3. If you need a refill on your pain medication, please contact your pharmacy.  They will contact our office to request authorization.  Prescriptions will not be filled after 5pm or on week-ends. 4. You should eat very light the first 24 hours after surgery, such as soup, crackers, pudding, etc.  Resume your normal diet the day after surgery. 5. Most patients will experience some swelling and bruising in the breast.  Ice packs and a good support bra will help.  Wear the breast binder provided or a sports bra for 72 hours day and night.  After that wear a sports bra during the day until you return to the office. Swelling and bruising can take several days to resolve.  6. It is common to experience some constipation if taking pain medication after surgery.  Increasing fluid intake and taking a stool softener will usually help or prevent this problem from occurring.  A mild laxative (Milk of Magnesia or Miralax) should be taken according to package directions if there are no bowel movements after  48 hours. 7. Unless discharge instructions indicate otherwise, you may remove your bandages 48 hours after surgery and you may shower at that time.  You may have steri-strips (small skin tapes) in place directly over the incision.  These strips should be left on the skin for 7-10 days and will come off on their own.  If your surgeon used skin glue on the  incision, you may shower in 24 hours.  The glue will flake off over the next 2-3 weeks.  Any sutures or staples will be removed at the office during your follow-up visit. 8. ACTIVITIES:  You may resume regular daily activities (gradually increasing) beginning the next day.  Wearing a good support bra or sports bra minimizes pain and swelling.  You may have sexual intercourse when it is comfortable. a. You may drive when you no longer are taking prescription pain medication, you can comfortably wear a seatbelt, and you can safely maneuver your car and apply brakes. b. RETURN TO WORK:  ______________________________________________________________________________________ 9. You should see your doctor in the office for a follow-up appointment approximately two weeks after your surgery.  Your doctors nurse will typically make your follow-up appointment when she calls you with your pathology report.  Expect your pathology report 3-4 business days after your surgery.  You may call to check if you do not hear from [place] after three days. 10. OTHER INSTRUCTIONS: _______________________________________________________________________________________________ _____________________________________________________________________________________________________________________________________ _____________________________________________________________________________________________________________________________________ _____________________________________________________________________________________________________________________________________  WHEN TO CALL DR WAKEFIELD: 1. Fever over 101.0 2. Nausea and/or vomiting. 3. Extreme swelling or bruising. 4. Continued bleeding from incision. 5. Increased pain, redness, or drainage from the incision.  The clinic staff is available to answer your questions during regular business hours.  Please dont hesitate to call and ask to speak to one of the nurses for  clinical concerns.  If you have a medical emergency, go to the nearest emergency room or call 911.  A surgeon from [facility] is always on call at the hospital.  For further questions, please visit [email or web address]

## 2016-07-31 NOTE — H&P (Signed)
Grace Moyer is an 56 y.o. female.   Chief Complaint: left breast cancer HPI:  50 yof here in follow up near end of primary chemo for left breast cancer she has neuromyelitis optica treated with rituximab at [facility] by neurology. she has fh in mgm of breast cancer. she had mass and underwent evaluation. she has density c breasts. on mm had a left uoq mass and abnormal nodes. on [place] the mass is 3.5x2.5x2.2 cm present and multiple abnormal lymph nodes (at least 3) . the biopsy of the node is positive. the breast is a grade III tn idc that the Ki of 85%. she had port placed and will complete chemotherapy tomorrow. she has done well. she has been working throughout.  Repeat mri shows no abnl enhancement and nl appearing nodes.     Past Medical History:  Diagnosis Date  . Anxiety   . Family history of breast cancer   . Family history of colon cancer   . Malignant neoplasm of upper-outer quadrant of left female breast ([place]) 01/31/2016  . Neuromyelitis optica ([place])     Past Surgical History:  Procedure Laterality Date  . ABDOMINAL HYSTERECTOMY     partial  . EYE SURGERY    . PORTACATH PLACEMENT Right 02/11/2016   Procedure: INSERTION PORT-A-CATH WITH [place];  Surgeon: Rolm Bookbinder, MD;  Location: [place];  Service: General;  Laterality: Right;    Family History  Problem Relation Age of Onset  . Breast cancer Maternal Grandmother        dx in her 62s  . CAD Paternal Grandmother   . Colon cancer Paternal Uncle        dx in his 28s  . COPD Maternal Grandfather   . CAD Paternal Grandfather   . Breast cancer Cousin        dx in her early to mid 15s; maternal first cousin  . Lung cancer Paternal Uncle    Social History:  reports that she quit smoking about 14 years ago. Her smoking use included Cigarettes. She has never used smokeless tobacco. She reports that she drinks alcohol. She reports that she does not use drugs.  Allergies:  Allergies  Allergen Reactions   . Hydrocodone Other (See Comments)    Did not help with pain and kept her up all night    No prescriptions prior to admission.    No results found for this or any previous visit (from the past 48 hour(s)). Mm Lt Radioactive Seed Loc Mammo Guide  Result Date: 07/30/2016 CLINICAL DATA:  Localize left breast cancer. EXAM: MAMMOGRAPHIC GUIDED RADIOACTIVE SEED LOCALIZATION OF THE LEFT BREAST COMPARISON:  Previous exam(s). FINDINGS: Patient presents for radioactive seed localization prior to surgery. I met with the patient and we discussed the procedure of seed localization including benefits and alternatives. We discussed the high likelihood of a successful procedure. We discussed the risks of the procedure including infection, bleeding, tissue injury and further surgery. We discussed the low dose of radioactivity involved in the procedure. Informed, written consent was given. The usual time-out protocol was performed immediately prior to the procedure. Using mammographic guidance, sterile technique, 1% lidocaine and an I-125 radioactive seed, the biopsy clip was localized using a lateral approach. The follow-up mammogram images confirm the seed in the expected location and were marked for the surgeon. Follow-up survey of the patient confirms presence of the radioactive seed. Order number of I-125 seed:  [id number]. Total activity:  0.569 millicuries  Reference Date:  June 27, 2016 The patient tolerated the procedure well and was released from the [facility]. She was given instructions regarding seed removal. IMPRESSION: Radioactive seed localization left breast. No apparent complications. Electronically Signed   By: Dorise Bullion III M.D   On: 07/30/2016 14:07   [place] Lt Radioactive Seed Loc  Result Date: 07/30/2016 CLINICAL DATA:  Localize left axillary lymph node. EXAM: ULTRASOUND GUIDED RADIOACTIVE SEED LOCALIZATION OF THE LEFT AXILLA COMPARISON:  Previous exam(s). FINDINGS: Patient presents for  radioactive seed localization prior to surgery. I met with the patient and we discussed the procedure of seed localization including benefits and alternatives. We discussed the high likelihood of a successful procedure. We discussed the risks of the procedure including infection, bleeding, tissue injury and further surgery. We discussed the low dose of radioactivity involved in the procedure. Informed, written consent was given. The usual time-out protocol was performed immediately prior to the procedure. Using ultrasound guidance, sterile technique, 1% lidocaine and an I-125 radioactive seed, the left axillary lymph node marked with a HydroMARK was localized using a lateral approach. The follow-up mammogram images confirm the seed in the expected location and were marked for the surgeon. Follow-up survey of the patient confirms presence of the radioactive seed. Order number of I-125 seed:  [id number]. Total activity: 3.016 millicuries Reference Date: Jul 23, 2016 The patient tolerated the procedure well and was released from the [facility]. She was given instructions regarding seed removal. IMPRESSION: Radioactive seed localization left axilla. No apparent complications. Electronically Signed   By: Dorise Bullion III M.D   On: 07/30/2016 14:16    Review of Systems  Constitutional: Negative for fever.  All other systems reviewed and are negative.   Height _0  (1.6 m), weight 88.5 kg (195 lb). Physical Exam  Vitals Malachy Moan RMA; 07/01/2016 2:35 PM) 07/01/2016 2:34 PM Weight: 192.8 lb Height: 63in Body Surface Area: 1.9 m Body Mass Index: 34.15 kg/m  Temp.: 98.68F  Pulse: 122 (Regular)  BP: 120/70 (Sitting, Left Arm, Standard) Physical Exam Rolm Bookbinder MD; 07/01/2016 5:01 PM) Breast Nipples-No Discharge. Breast Lump-No Palpable Breast Mass. Lymphatic Head & Neck General Head & Neck Lymphatics: Bilateral - Description - Normal. Axillary General Axillary Region:  Bilateral - Description - Normal. Note: no [place] adenopathy  Assessment/Plan Assessment & Plan Rolm Bookbinder MD; 07/01/2016 5:04 PM) BREAST CANCER OF UPPER-OUTER QUADRANT OF LEFT FEMALE BREAST (C50.412) Left breast seed guided lumpectomy, left targeted axillary node dissection, port removal, followed by oncoplastic reduction pending pathology  Ramesh Moan, MD 07/31/2016, 9:40 AM

## 2016-08-01 ENCOUNTER — Encounter (HOSPITAL_BASED_OUTPATIENT_CLINIC_OR_DEPARTMENT_OTHER): Payer: Self-pay | Admitting: General Surgery

## 2016-08-01 NOTE — Anesthesia Postprocedure Evaluation (Signed)
Anesthesia Post Note  Patient: Grace Moyer  Procedure(s) Performed: Procedure(s) (LRB): LEFT BREAST RADIOACTIVE SEED GUIDED LUMPECTOMY WITH LEFT RADIOACTIVE SEED TARGETED AXILLARY SENTINEL LYMPH NODE EXCISION AND SENTINEL LYMPH NODE BIOPSY (Left) REMOVAL PORT-A-CATH (Right)     Patient location during evaluation: PACU Anesthesia Type: General Level of consciousness: awake and alert Pain management: pain level controlled Vital Signs Assessment: post-procedure vital signs reviewed and stable Respiratory status: spontaneous breathing, nonlabored ventilation, respiratory function stable and patient connected to nasal cannula oxygen Cardiovascular status: blood pressure returned to baseline and stable Postop Assessment: no signs of nausea or vomiting Anesthetic complications: no    Last Vitals:  Vitals:   07/31/16 1545 07/31/16 1623  BP: 129/84 131/79  Pulse: 72   Resp: 19 20  Temp:  36.6 C    Last Pain:  Vitals:   07/31/16 1623  TempSrc: Oral  PainSc: 1                  Catalina Gravel

## 2016-08-05 ENCOUNTER — Ambulatory Visit (HOSPITAL_BASED_OUTPATIENT_CLINIC_OR_DEPARTMENT_OTHER): Payer: 59 | Admitting: Anesthesiology

## 2016-08-05 ENCOUNTER — Ambulatory Visit (HOSPITAL_BASED_OUTPATIENT_CLINIC_OR_DEPARTMENT_OTHER)
Admission: RE | Admit: 2016-08-05 | Discharge: 2016-08-05 | Disposition: A | Discharge status: Home / Self Care | Payer: 59 | Source: Ambulatory Visit | Attending: Plastic Surgery | Admitting: Plastic Surgery

## 2016-08-05 ENCOUNTER — Encounter (HOSPITAL_BASED_OUTPATIENT_CLINIC_OR_DEPARTMENT_OTHER)
Admission: RE | Disposition: A | Discharge status: Home / Self Care | Payer: Self-pay | Source: Ambulatory Visit | Attending: Plastic Surgery

## 2016-08-05 ENCOUNTER — Encounter (HOSPITAL_BASED_OUTPATIENT_CLINIC_OR_DEPARTMENT_OTHER): Payer: Self-pay | Admitting: *Deleted

## 2016-08-05 DIAGNOSIS — M542 Cervicalgia: Secondary | ICD-10-CM | POA: Insufficient documentation

## 2016-08-05 DIAGNOSIS — Z87891 Personal history of nicotine dependence: Secondary | ICD-10-CM | POA: Insufficient documentation

## 2016-08-05 DIAGNOSIS — G360 Neuromyelitis optica [Devic]: Secondary | ICD-10-CM | POA: Diagnosis not present

## 2016-08-05 DIAGNOSIS — F419 Anxiety disorder, unspecified: Secondary | ICD-10-CM | POA: Insufficient documentation

## 2016-08-05 DIAGNOSIS — N62 Hypertrophy of breast: Secondary | ICD-10-CM | POA: Insufficient documentation

## 2016-08-05 DIAGNOSIS — C50412 Malignant neoplasm of upper-outer quadrant of left female breast: Secondary | ICD-10-CM | POA: Diagnosis not present

## 2016-08-05 DIAGNOSIS — M549 Dorsalgia, unspecified: Secondary | ICD-10-CM | POA: Insufficient documentation

## 2016-08-05 DIAGNOSIS — N6012 Diffuse cystic mastopathy of left breast: Secondary | ICD-10-CM | POA: Diagnosis not present

## 2016-08-05 DIAGNOSIS — Z853 Personal history of malignant neoplasm of breast: Secondary | ICD-10-CM | POA: Insufficient documentation

## 2016-08-05 DIAGNOSIS — G8929 Other chronic pain: Secondary | ICD-10-CM | POA: Insufficient documentation

## 2016-08-05 DIAGNOSIS — Z9221 Personal history of antineoplastic chemotherapy: Secondary | ICD-10-CM | POA: Diagnosis not present

## 2016-08-05 DIAGNOSIS — N6011 Diffuse cystic mastopathy of right breast: Secondary | ICD-10-CM | POA: Diagnosis not present

## 2016-08-05 DIAGNOSIS — D6481 Anemia due to antineoplastic chemotherapy: Secondary | ICD-10-CM | POA: Diagnosis not present

## 2016-08-05 HISTORY — PX: BREAST REDUCTION SURGERY: SHX8

## 2016-08-05 SURGERY — MAMMOPLASTY, REDUCTION
Anesthesia: General | Site: Breast | Laterality: Bilateral

## 2016-08-05 MED ORDER — GABAPENTIN 300 MG PO CAPS
300.0000 mg | ORAL_CAPSULE | ORAL | Status: AC
  Administered 2016-08-05: 300 mg via ORAL

## 2016-08-05 MED ORDER — CELECOXIB 400 MG PO CAPS
400.0000 mg | ORAL_CAPSULE | ORAL | Status: AC
  Administered 2016-08-05: 400 mg via ORAL

## 2016-08-05 MED ORDER — CHLORHEXIDINE GLUCONATE CLOTH 2 % EX PADS: 6.0000 | MEDICATED_PAD | Freq: Once | CUTANEOUS | Status: DC

## 2016-08-05 MED ORDER — KETOROLAC TROMETHAMINE 30 MG/ML IJ SOLN
INTRAMUSCULAR | Status: AC
  Filled 2016-08-05: qty 1

## 2016-08-05 MED ORDER — MIDAZOLAM HCL 2 MG/2ML IJ SOLN
INTRAMUSCULAR | Status: AC
  Filled 2016-08-05: qty 2

## 2016-08-05 MED ORDER — PROPOFOL 10 MG/ML IV BOLUS
INTRAVENOUS | Status: DC | PRN
  Administered 2016-08-05: 200 mg via INTRAVENOUS

## 2016-08-05 MED ORDER — ONDANSETRON HCL 4 MG/2ML IJ SOLN
INTRAMUSCULAR | Status: DC | PRN
  Administered 2016-08-05: 4 mg via INTRAVENOUS

## 2016-08-05 MED ORDER — MIDAZOLAM HCL 2 MG/2ML IJ SOLN: 1.0000 mg | INTRAMUSCULAR | Status: DC | PRN

## 2016-08-05 MED ORDER — SUFENTANIL CITRATE 50 MCG/ML IV SOLN
INTRAVENOUS | Status: DC | PRN
  Administered 2016-08-05 (×2): 10 ug via INTRAVENOUS
  Administered 2016-08-05: 5 ug via INTRAVENOUS

## 2016-08-05 MED ORDER — CEFAZOLIN SODIUM-DEXTROSE 2-4 GM/100ML-% IV SOLN
INTRAVENOUS | Status: AC
  Filled 2016-08-05: qty 100

## 2016-08-05 MED ORDER — HYDROMORPHONE HCL 1 MG/ML IJ SOLN
0.2500 mg | INTRAMUSCULAR | Status: DC | PRN
  Administered 2016-08-05 (×3): 0.5 mg via INTRAVENOUS

## 2016-08-05 MED ORDER — HYDROMORPHONE HCL 1 MG/ML IJ SOLN
INTRAMUSCULAR | Status: AC
  Filled 2016-08-05: qty 0.5

## 2016-08-05 MED ORDER — PROMETHAZINE HCL 25 MG/ML IJ SOLN: 6.2500 mg | INTRAMUSCULAR | Status: DC | PRN

## 2016-08-05 MED ORDER — DEXAMETHASONE SODIUM PHOSPHATE 4 MG/ML IJ SOLN
INTRAMUSCULAR | Status: DC | PRN
  Administered 2016-08-05: 10 mg via INTRAVENOUS

## 2016-08-05 MED ORDER — CEFAZOLIN SODIUM-DEXTROSE 2-4 GM/100ML-% IV SOLN
2.0000 g | INTRAVENOUS | Status: AC
  Administered 2016-08-05: 2 g via INTRAVENOUS

## 2016-08-05 MED ORDER — SUFENTANIL CITRATE 50 MCG/ML IV SOLN
INTRAVENOUS | Status: AC
  Filled 2016-08-05: qty 1

## 2016-08-05 MED ORDER — ACETAMINOPHEN 10 MG/ML IV SOLN
INTRAVENOUS | Status: DC | PRN
  Administered 2016-08-05: 1000 mg via INTRAVENOUS

## 2016-08-05 MED ORDER — OXYCODONE HCL 5 MG PO TABS
5.0000 mg | ORAL_TABLET | Freq: Once | ORAL | Status: AC | PRN
Start: 2016-08-05 — End: 2016-08-05
  Administered 2016-08-05: 5 mg via ORAL

## 2016-08-05 MED ORDER — SCOPOLAMINE 1 MG/3DAYS TD PT72: 1.0000 | MEDICATED_PATCH | Freq: Once | TRANSDERMAL | Status: DC | PRN

## 2016-08-05 MED ORDER — LIDOCAINE HCL (CARDIAC) 20 MG/ML IV SOLN
INTRAVENOUS | Status: DC | PRN
  Administered 2016-08-05: 100 mg via INTRAVENOUS

## 2016-08-05 MED ORDER — LACTATED RINGERS IV SOLN
INTRAVENOUS | Status: DC
  Administered 2016-08-05: 15:00:00 via INTRAVENOUS
  Administered 2016-08-05: 10 mL/h via INTRAVENOUS
  Administered 2016-08-05 (×2): via INTRAVENOUS

## 2016-08-05 MED ORDER — MEPERIDINE HCL 25 MG/ML IJ SOLN: 6.2500 mg | INTRAMUSCULAR | Status: DC | PRN

## 2016-08-05 MED ORDER — ACETAMINOPHEN 500 MG PO TABS
1000.0000 mg | ORAL_TABLET | ORAL | Status: AC
  Administered 2016-08-05: 1000 mg via ORAL

## 2016-08-05 MED ORDER — CELECOXIB 200 MG PO CAPS
ORAL_CAPSULE | ORAL | Status: AC
  Filled 2016-08-05: qty 2

## 2016-08-05 MED ORDER — ACETAMINOPHEN 10 MG/ML IV SOLN
INTRAVENOUS | Status: AC
  Filled 2016-08-05: qty 100

## 2016-08-05 MED ORDER — OXYCODONE HCL 5 MG PO TABS: 5.0000 mg | ORAL_TABLET | ORAL | 0 refills | Status: DC | PRN

## 2016-08-05 MED ORDER — FENTANYL CITRATE (PF) 100 MCG/2ML IJ SOLN: 50.0000 ug | INTRAMUSCULAR | Status: DC | PRN

## 2016-08-05 MED ORDER — OXYCODONE HCL 5 MG/5ML PO SOLN: 5.0000 mg | Freq: Once | ORAL | Status: AC | PRN

## 2016-08-05 MED ORDER — EPHEDRINE SULFATE 50 MG/ML IJ SOLN
INTRAMUSCULAR | Status: DC | PRN
  Administered 2016-08-05: 10 mg via INTRAVENOUS

## 2016-08-05 MED ORDER — ACETAMINOPHEN 500 MG PO TABS
ORAL_TABLET | ORAL | Status: AC
  Filled 2016-08-05: qty 2

## 2016-08-05 MED ORDER — OXYCODONE HCL 5 MG PO TABS
ORAL_TABLET | ORAL | Status: AC
  Filled 2016-08-05: qty 1

## 2016-08-05 MED ORDER — KETOROLAC TROMETHAMINE 30 MG/ML IJ SOLN
30.0000 mg | Freq: Once | INTRAMUSCULAR | Status: AC | PRN
  Administered 2016-08-05: 30 mg via INTRAVENOUS

## 2016-08-05 MED ORDER — SODIUM CHLORIDE 0.9 % IV SOLN
INTRAVENOUS | Status: DC | PRN
  Administered 2016-08-05: 40 mL

## 2016-08-05 MED ORDER — 0.9 % SODIUM CHLORIDE (POUR BTL) OPTIME
TOPICAL | Status: DC | PRN
  Administered 2016-08-05: 1000 mL

## 2016-08-05 MED ORDER — ROCURONIUM BROMIDE 100 MG/10ML IV SOLN
INTRAVENOUS | Status: DC | PRN
  Administered 2016-08-05: 40 mg via INTRAVENOUS

## 2016-08-05 MED ORDER — GABAPENTIN 300 MG PO CAPS
ORAL_CAPSULE | ORAL | Status: AC
  Filled 2016-08-05: qty 1

## 2016-08-05 MED ORDER — MIDAZOLAM HCL 5 MG/5ML IJ SOLN
INTRAMUSCULAR | Status: DC | PRN
  Administered 2016-08-05: 2 mg via INTRAVENOUS

## 2016-08-05 MED ORDER — SODIUM CHLORIDE 0.9 % IJ SOLN
INTRAMUSCULAR | Status: AC
  Filled 2016-08-05: qty 20

## 2016-08-05 SURGICAL SUPPLY — 55 items
APPLIER CLIP 9.375 MED OPEN (MISCELLANEOUS)
BINDER BREAST 3XL (BIND) ×2 IMPLANT
BINDER BREAST LRG (GAUZE/BANDAGES/DRESSINGS) IMPLANT
BINDER BREAST MEDIUM (GAUZE/BANDAGES/DRESSINGS) IMPLANT
BINDER BREAST XLRG (GAUZE/BANDAGES/DRESSINGS) IMPLANT
BINDER BREAST XXLRG (GAUZE/BANDAGES/DRESSINGS) IMPLANT
BLADE SURG 10 STRL SS (BLADE) ×12 IMPLANT
BNDG GAUZE ELAST 4 BULKY (GAUZE/BANDAGES/DRESSINGS) ×4 IMPLANT
CANISTER SUCT 1200ML W/VALVE (MISCELLANEOUS) ×2 IMPLANT
CHLORAPREP W/TINT 26ML (MISCELLANEOUS) ×4 IMPLANT
CLIP APPLIE 9.375 MED OPEN (MISCELLANEOUS) IMPLANT
CLIP TI MEDIUM 6 (CLIP) ×2 IMPLANT
COVER BACK TABLE 60X90IN (DRAPES) ×2 IMPLANT
COVER MAYO STAND STRL (DRAPES) ×2 IMPLANT
DRAIN CHANNEL 15F RND FF W/TCR (WOUND CARE) ×4 IMPLANT
DRAPE TOP ARMCOVERS (MISCELLANEOUS) ×2 IMPLANT
DRAPE U-SHAPE 76X120 STRL (DRAPES) ×2 IMPLANT
DRAPE UTILITY XL STRL (DRAPES) IMPLANT
DRSG PAD ABDOMINAL 8X10 ST (GAUZE/BANDAGES/DRESSINGS) ×4 IMPLANT
ELECT COATED BLADE 2.86 ST (ELECTRODE) ×2 IMPLANT
ELECT REM PT RETURN 9FT ADLT (ELECTROSURGICAL) ×2
ELECTRODE REM PT RTRN 9FT ADLT (ELECTROSURGICAL) ×1 IMPLANT
EVACUATOR SILICONE 100CC (DRAIN) ×4 IMPLANT
GLOVE BIO SURGEON STRL SZ 6 (GLOVE) ×10 IMPLANT
GLOVE BIOGEL PI IND STRL 7.0 (GLOVE) ×6 IMPLANT
GLOVE BIOGEL PI INDICATOR 7.0 (GLOVE) ×6
GLOVE ECLIPSE 6.5 STRL STRAW (GLOVE) ×8 IMPLANT
GLOVE SURG SS PI 6.5 STRL IVOR (GLOVE) ×2 IMPLANT
GOWN STRL REUS W/ TWL LRG LVL3 (GOWN DISPOSABLE) ×4 IMPLANT
GOWN STRL REUS W/TWL LRG LVL3 (GOWN DISPOSABLE) ×4
LIQUID BAND (GAUZE/BANDAGES/DRESSINGS) ×6 IMPLANT
MARKER SKIN DUAL TIP RULER LAB (MISCELLANEOUS) IMPLANT
NEEDLE HYPO 25X1 1.5 SAFETY (NEEDLE) ×2 IMPLANT
NS IRRIG 1000ML POUR BTL (IV SOLUTION) ×2 IMPLANT
PACK BASIN DAY SURGERY FS (CUSTOM PROCEDURE TRAY) ×2 IMPLANT
PENCIL BUTTON HOLSTER BLD 10FT (ELECTRODE) ×2 IMPLANT
PIN SAFETY STERILE (MISCELLANEOUS) ×2 IMPLANT
SHEET MEDIUM DRAPE 40X70 STRL (DRAPES) ×2 IMPLANT
SLEEVE SCD COMPRESS KNEE MED (MISCELLANEOUS) ×2 IMPLANT
SPONGE LAP 18X18 X RAY DECT (DISPOSABLE) ×16 IMPLANT
STAPLER VISISTAT 35W (STAPLE) ×4 IMPLANT
SUT ETHILON 2 0 FS 18 (SUTURE) ×4 IMPLANT
SUT MNCRL AB 4-0 PS2 18 (SUTURE) ×8 IMPLANT
SUT PDS AB 2-0 CT2 27 (SUTURE) ×2 IMPLANT
SUT VIC AB 3-0 PS1 18 (SUTURE) ×8
SUT VIC AB 3-0 PS1 18XBRD (SUTURE) ×8 IMPLANT
SUT VICRYL 4-0 PS2 18IN ABS (SUTURE) ×8 IMPLANT
SYR BULB IRRIGATION 50ML (SYRINGE) ×2 IMPLANT
SYR CONTROL 10ML LL (SYRINGE) ×2 IMPLANT
TAPE MEASURE VINYL STERILE (MISCELLANEOUS) IMPLANT
TOWEL OR 17X24 6PK STRL BLUE (TOWEL DISPOSABLE) ×4 IMPLANT
TRAY FOLEY BAG SILVER LF 14FR (SET/KITS/TRAYS/PACK) ×2 IMPLANT
TUBE CONNECTING 20X1/4 (TUBING) ×2 IMPLANT
UNDERPAD 30X30 (UNDERPADS AND DIAPERS) ×4 IMPLANT
YANKAUER SUCT BULB TIP NO VENT (SUCTIONS) ×2 IMPLANT

## 2016-08-05 NOTE — Interval H&P Note (Signed)
History and Physical Interval Note:  08/05/2016 11:10 AM  Grace Moyer  has presented today for surgery, with the diagnosis of HISTORY OF LEFT BREAST CANCER  The various methods of treatment have been discussed with the patient and family. After consideration of risks, benefits and other options for treatment, the patient has consented to  Procedure(s): LEFT ONCOPLASTIC REDUCTION; RIGHT BREAST REDUCTION (Bilateral) as a surgical intervention .  The patient's history has been reviewed, patient examined, no change in status, stable for surgery.  I have reviewed the patient's chart and labs.  Questions were answered to the patient's satisfaction.     [name]

## 2016-08-05 NOTE — Transfer of Care (Signed)
Immediate Anesthesia Transfer of Care Note  Patient: Grace Moyer  Procedure(s) Performed: Procedure(s): LEFT ONCOPLASTIC REDUCTION; RIGHT BREAST REDUCTION (Bilateral)  Patient Location: PACU  Anesthesia Type:General  Level of Consciousness: awake and alert   Airway & Oxygen Therapy: Patient Spontanous Breathing and Patient connected to face mask oxygen  Post-op Assessment: Report given to RN and Post -op Vital signs reviewed and stable  Post vital signs: Reviewed and stable  Last Vitals:  Vitals:   08/05/16 1104  BP: 112/67  Pulse: 79  Resp: 20  Temp: 36.8 C    Last Pain:  Vitals:   08/05/16 1104  TempSrc: Oral  PainSc: 4       Patients Stated Pain Goal: 7 ([id number])  Complications: No apparent anesthesia complications

## 2016-08-05 NOTE — Discharge Instructions (Signed)
Information for Discharge Teaching: EXPAREL (bupivacaine liposome injectable suspension)   Your surgeon gave you EXPAREL(bupivacaine) in your surgical incision to help control your pain after surgery.   EXPAREL is a local anesthetic that provides pain relief by numbing the tissue around the surgical site.  EXPAREL is designed to release pain medication over time and can control pain for up to 72 hours.  Depending on how you respond to EXPAREL, you may require less pain medication during your recovery.  Possible side effects:  Temporary loss of sensation or ability to move in the area where bupivacaine was injected.  Nausea, vomiting, constipation  Rarely, numbness and tingling in your mouth or lips, lightheadedness, or anxiety may occur.  Call your doctor right away if you think you may be experiencing any of these sensations, or if you have other questions regarding possible side effects.  Follow all other discharge instructions given to you by your surgeon or nurse. Eat a healthy diet and drink plenty of water or other fluids.  If you return to the hospital for any reason within 96 hours following the administration of EXPAREL, please inform your health care providers.       About my Jackson-Pratt Bulb Drain  What is a Jackson-Pratt bulb? A Jackson-Pratt is a soft, round device used to collect drainage. It is connected to a long, thin drainage catheter, which is held in place by one or two small stiches near your surgical incision site. When the bulb is squeezed, it forms a vacuum, forcing the drainage to empty into the bulb.  Emptying the Jackson-Pratt bulb- To empty the bulb: 1. Release the plug on the top of the bulb. 2. Pour the bulb's contents into a measuring container which your nurse will provide. 3. Record the time emptied and amount of drainage. Empty the drain(s) as often as your     doctor or nurse recommends.  Date                  Time                     Amount (Drain 1)                 Amount (Drain 2)  _____________________________________________________________________  _____________________________________________________________________  _____________________________________________________________________  _____________________________________________________________________  _____________________________________________________________________  _____________________________________________________________________  _____________________________________________________________________  _____________________________________________________________________  Squeezing the Jackson-Pratt Bulb- To squeeze the bulb: 1. Make sure the plug at the top of the bulb is open. 2. Squeeze the bulb tightly in your fist. You will hear air squeezing from the bulb. 3. Replace the plug while the bulb is squeezed. 4. Use a safety pin to attach the bulb to your clothing. This will keep the catheter from     pulling at the bulb insertion site.  When to call your doctor- Call your doctor if:  Drain site becomes red, swollen or hot.  You have a fever greater than 101 degrees F.  There is oozing at the drain site.  Drain falls out (apply a guaze bandage over the drain hole and secure it with tape).  Drainage increases daily not related to activity patterns. (You will usually have more drainage when you are active than when you are resting.)  Drainage has a bad odor.       Post Anesthesia Home Care Instructions  Activity: Get plenty of rest for the remainder of the day. A responsible individual must stay with you for 24 hours following the procedure.  For the  next 24 hours, DO NOT: -Drive a car -Paediatric nurse -Drink alcoholic beverages -Take any medication unless instructed by your physician -Make any legal decisions or sign important papers.  Meals: Start with liquid foods such as gelatin or soup. Progress to regular foods as  tolerated. Avoid greasy, spicy, heavy foods. If nausea and/or vomiting occur, drink only clear liquids until the nausea and/or vomiting subsides. Call your physician if vomiting continues.  Special Instructions/Symptoms: Your throat may feel dry or sore from the anesthesia or the breathing tube placed in your throat during surgery. If this causes discomfort, gargle with warm salt water. The discomfort should disappear within 24 hours.  If you had a scopolamine patch placed behind your ear for the management of post- operative nausea and/or vomiting:  1. The medication in the patch is effective for 72 hours, after which it should be removed.  Wrap patch in a tissue and discard in the trash. Wash hands thoroughly with soap and water. 2. You may remove the patch earlier than 72 hours if you experience unpleasant side effects which may include dry mouth, dizziness or visual disturbances. 3. Avoid touching the patch. Wash your hands with soap and water after contact with the patch.

## 2016-08-05 NOTE — Anesthesia Procedure Notes (Signed)
Procedure Name: Intubation Date/Time: 08/05/2016 1:20 PM Performed by: Melynda Ripple D Pre-anesthesia Checklist: Patient identified, Emergency Drugs available, Suction available and Patient being monitored Patient Re-evaluated:Patient Re-evaluated prior to inductionOxygen Delivery Method: Circle system utilized Preoxygenation: Pre-oxygenation with 100% oxygen Intubation Type: IV induction Ventilation: Mask ventilation without difficulty Tube type: Oral Tube size: 7.0 mm Number of attempts: 1 Airway Equipment and Method: Stylet and Oral airway Placement Confirmation: ETT inserted through vocal cords under direct vision,  positive ETCO2 and breath sounds checked- equal and bilateral Secured at: 23 cm Tube secured with: Tape Dental Injury: Teeth and Oropharynx as per pre-operative assessment

## 2016-08-05 NOTE — Op Note (Addendum)
Operative Note   DATE OF OPERATION: 6.5.18  LOCATION: [facility] DIVISION: Plastic Surgery  PREOPERATIVE DIAGNOSES:  1. Left breast cancer UOQ triple negative 2. Post neoadjuvant chemotherapy 3. Macromastia 4. Chronic neck and back pain  POSTOPERATIVE DIAGNOSES:  same  PROCEDURE:  Left oncoplastic reconstruction, right breast reduction  SURGEON: Irene Limbo MD MBA  ASSISTANT: none  ANESTHESIA:  General.   EBL: 75 ml  COMPLICATIONS: None immediate.   INDICATIONS FOR PROCEDURE:  The patient, Grace Moyer, is a 56 y.o. female born on December 22, 1960, is here for oncoplastic reconstruction following left lumpectomy in setting of macromastia.   FINDINGS: Left reduction 356 g, right reduction 556 g.  DESCRIPTION OF PROCEDURE:  The patient was marked standing in the preoperative area to mark sternal notch, chest midline, anterior axillary lines, inframammary folds. The location of new nipple areolar complex was marked at level of on inframammary fold on anterior surface breast by palpation. This was marked symmetric over bilateral breasts. With aid of Wise pattern marker, location of new nipple areolar complex and vertical limbs (8cm) were marked. The patient was taken to the operating room. SCDs were placed and IV antibiotics were given. The patient's operative site was prepped and draped in a sterile fashion. A time out was performed and all information was confirmed to be correct.   Over left breast, superomedial pedicle marked and nipple areolar complex marked with 50 mm diameter marker. Pedicle deepithlialized and developedto chest wall. Breast tissue resected over medial lower pole. An inferiorly based pedicle of soft tissue was maintained laterally and advanced superiorly into the lumpectomy cavity over upper outer quadrant. Medial and lateral flaps developed. Breast tailor tacked closed.  I then directed attention to right breast where  superomedial pedicle designed. The pedicle was deepithelialized and developed to 5-6 cm thickness and toward chest wall until tension free closure obtained. Lower polebreast tissue excised. Breast tailor tacked closed, and patient brought to upright sitting position and assessed for symmetry. Patent returned to supine position and breast cavities irrigated and hemostasis obtained. Exparel infiltrated throughout each breast. 15 Fr JP placed in each breast and secured with 2-0 nylon. Closure completed with 3-0 vicryl to approximate dermis along inframammary fold and vertical limb. NAC inset with 4-0 vicryl in dermis. Skin closure completed with 4-0 monocryl subcuticular throughout.Tissue adhesive applied. Dry dressing and breast binder applied.  The patient was allowed to wake from anesthesia, extubated and taken to the recovery room in satisfactory condition.   SPECIMENS: right and left breast tissue  DRAINS: 15 Fr JP in right and left breast  Irene Limbo, MD [facility] Plastic & Reconstructive Surgery [phone], pin [phone]

## 2016-08-05 NOTE — Anesthesia Preprocedure Evaluation (Signed)
Anesthesia Evaluation  Patient identified by MRN, date of birth, ID band Patient awake    Reviewed: Allergy & Precautions, NPO status , Patient's Chart, lab work & pertinent test results  Airway Mallampati: II  TM Distance: >3 FB Neck ROM: Full    Dental  (+) Teeth Intact, Dental Advisory Given   Pulmonary former smoker,    Pulmonary exam normal breath sounds clear to auscultation       Cardiovascular Exercise Tolerance: Good negative cardio ROS Normal cardiovascular exam Rhythm:Regular Rate:Normal     Neuro/Psych PSYCHIATRIC DISORDERS Anxiety Neuromyelitis optica--BUE paresthesias    GI/Hepatic negative GI ROS, Neg liver ROS,   Endo/Other  Obesity   Renal/GU negative Renal ROS     Musculoskeletal negative musculoskeletal ROS (+)   Abdominal   Peds  Hematology  (+) Blood dyscrasia, anemia ,   Anesthesia Other Findings Day of surgery medications reviewed with the patient.  Left breast cancer  Reproductive/Obstetrics negative OB ROS                             Anesthesia Physical  Anesthesia Plan  ASA: III  Anesthesia Plan: General   Post-op Pain Management:  Regional for Post-op pain   Induction: Intravenous  PONV Risk Score and Plan: 3 and Ondansetron, Dexamethasone, Propofol, Midazolam and Treatment may vary due to age  Airway Management Planned: LMA  Additional Equipment:   Intra-op Plan:   Post-operative Plan: Extubation in OR  Informed Consent: I have reviewed the patients History and Physical, chart, labs and discussed the procedure including the risks, benefits and alternatives for the proposed anesthesia with the patient or authorized representative who has indicated his/her understanding and acceptance.   Dental advisory given  Plan Discussed with: CRNA  Anesthesia Plan Comments:        Anesthesia Quick Evaluation

## 2016-08-06 ENCOUNTER — Encounter (HOSPITAL_BASED_OUTPATIENT_CLINIC_OR_DEPARTMENT_OTHER): Payer: Self-pay | Admitting: Plastic Surgery

## 2016-08-06 NOTE — Anesthesia Postprocedure Evaluation (Signed)
Anesthesia Post Note  Patient: Grace Moyer  Procedure(s) Performed: Procedure(s) (LRB): LEFT ONCOPLASTIC REDUCTION; RIGHT BREAST REDUCTION (Bilateral)     Patient location during evaluation: PACU Anesthesia Type: General Level of consciousness: sedated and patient cooperative Pain management: pain level controlled Vital Signs Assessment: post-procedure vital signs reviewed and stable Respiratory status: spontaneous breathing Cardiovascular status: stable Anesthetic complications: no    Last Vitals:  Vitals:   08/05/16 1800 08/05/16 1845  BP: 128/72 (!) 141/71  Pulse: 69 68  Resp: 18 20  Temp:  36.6 C    Last Pain:  Vitals:   08/05/16 1845  TempSrc:   PainSc: 3                  Nolon Nations

## 2016-08-07 NOTE — Addendum Note (Signed)
Addendum  created 08/07/16 0722 by Meagon Duskin, Ernesta Amble, CRNA   Charge Capture section accepted

## 2016-08-08 NOTE — Progress Notes (Signed)
Location of Breast Cancer: Upper-outer quadrant of left breast in female  Histology per Pathology Report: 08-05-16    Diagnosis 07-31-16 1. Breast, lumpectomy, Left - LOBULAR NEOPLASIA (ATYPICAL LOBULAR HYPERPLASIA). - FIBROCYSTIC CHANGES WITH ADENOSIS AND CALCIFICATIONS. - [name]. - SEE ONCOLOGY TABLE BELOW. 2. Breast, excision, Left additional Medial Margin - LOBULAR NEOPLASIA (ATYPICAL LOBULAR HYPERPLASIA). - FIBROCYSTIC CHANGES WITH ADENOSIS AND CALCIFICATIONS. - RADIAL SCAR. - HEALING BIOPSY SITE. - SEE COMMENT. 3. Lymph node, sentinel, biopsy, Left - THERE IS NO EVIDENCE OF CARCINOMA IN 1 OF 1 LYMPH NODE (0/1) - CHANGES CONSISTENT WITH PRIOR PROCEDURE. 4. Lymph node, sentinel, biopsy, Left - THERE IS NO EVIDENCE OF CARCINOMA IN 1 OF 1 LYMPH NODE (0/1) 5. Lymph node, sentinel, biopsy, Left - THERE IS NO EVIDENCE OF CARCINOMA IN 1 OF 1 LYMPH NODE (0/1) 6. Lymph node, sentinel, biopsy, Left - THERE IS NO EVIDENCE OF CARCINOMA IN 1 OF 1 LYMPH NODE (0/1) 7. Lymph node, sentinel, biopsy, Left - THERE IS NO EVIDENCE OF CARCINOMA IN 1 OF 1 LYMPH NODE (0/1) Microscopic Comment   Receptor Status: ER(0% -), PR (0 -), Her2-neu (-), Ki-(85%)  Did patient present with symptoms (if so, please note symptoms) or was this found on screening mammography?: Screening mammogram   Past/Anticipated interventions by surgeon, if any:08-05-16  Left lumpectomy Dr. Rolm Bookbinder, 01-29-16 Left breast biopsy and axillary node  Past/Anticipated interventions by medical oncology, if any:07-11-16 Bilateral Breast MRI, 03-27-16 Genetic Testing                                                                                                    Chemotherapy prior to surgeryAdriamycin, Cytoxan every 2 weeks, with Neulasta support, for 4 cycles, followed by weekly carboplatin and Taxol for 12 weeks           consider oral Xeloda if she has significant residual disease                                                                                                    Lymphedema issues, if any: No                                                                                           Pain issues, if any:    SAFETY ISSUES:  Prior  radiation? : No  Pacemaker/ICD? : No  Possible current pregnancy?:No  Is the patient on methotrexate? :No  Menarchal: 13 LMP: 45 (hysterectomy)  Menopause 45 Contraceptive: 3 years  HRT: n/a  G1P1: 65 yo daughter  Current Complaints / other details: 56 year old married woman, family history of breast cancer and colon cancer Wt Readings from Last 3 Encounters:  08/14/16 190 lb 6.4 oz (86.4 kg)  08/05/16 192 lb 6.4 oz (87.3 kg)  07/31/16 193 lb (87.5 kg)  BP 116/74   Pulse 80   Temp 97.9 F (36.6 C) (Oral)   Resp 18   Ht 5' 3"  (1.6 m)   Wt 190 lb 6.4 oz (86.4 kg)   SpO2 100%   BMI 33.73 kg/m      Grace Spurling, RN 08/08/2016,3:53 PM

## 2016-08-14 ENCOUNTER — Encounter: Payer: Self-pay | Admitting: Radiation Oncology

## 2016-08-14 ENCOUNTER — Ambulatory Visit
Admission: RE | Admit: 2016-08-14 | Discharge: 2016-08-14 | Disposition: A | Discharge status: Home / Self Care | Payer: 59 | Source: Ambulatory Visit | Attending: Radiation Oncology | Admitting: Radiation Oncology

## 2016-08-14 VITALS — BP 116/74 | HR 80 | Temp 97.9°F | Resp 18 | Ht 63.0 in | Wt 190.4 lb

## 2016-08-14 DIAGNOSIS — C50411 Malignant neoplasm of upper-outer quadrant of right female breast: Secondary | ICD-10-CM

## 2016-08-14 DIAGNOSIS — Z9889 Other specified postprocedural states: Secondary | ICD-10-CM | POA: Insufficient documentation

## 2016-08-14 DIAGNOSIS — Z510 Encounter for antineoplastic radiation therapy: Secondary | ICD-10-CM | POA: Insufficient documentation

## 2016-08-14 DIAGNOSIS — Z90710 Acquired absence of both cervix and uterus: Secondary | ICD-10-CM | POA: Diagnosis not present

## 2016-08-14 DIAGNOSIS — Z825 Family history of asthma and other chronic lower respiratory diseases: Secondary | ICD-10-CM | POA: Diagnosis not present

## 2016-08-14 DIAGNOSIS — Z79899 Other long term (current) drug therapy: Secondary | ICD-10-CM | POA: Insufficient documentation

## 2016-08-14 DIAGNOSIS — C50412 Malignant neoplasm of upper-outer quadrant of left female breast: Secondary | ICD-10-CM

## 2016-08-14 DIAGNOSIS — Z171 Estrogen receptor negative status [ER-]: Principal | ICD-10-CM

## 2016-08-14 DIAGNOSIS — Z800 Family history of malignant neoplasm of digestive organs: Secondary | ICD-10-CM | POA: Insufficient documentation

## 2016-08-14 DIAGNOSIS — Z818 Family history of other mental and behavioral disorders: Secondary | ICD-10-CM | POA: Insufficient documentation

## 2016-08-14 DIAGNOSIS — Z8249 Family history of ischemic heart disease and other diseases of the circulatory system: Secondary | ICD-10-CM | POA: Insufficient documentation

## 2016-08-14 DIAGNOSIS — Z87891 Personal history of nicotine dependence: Secondary | ICD-10-CM | POA: Insufficient documentation

## 2016-08-14 DIAGNOSIS — Z820 Family history of epilepsy and other diseases of the nervous system: Secondary | ICD-10-CM | POA: Diagnosis not present

## 2016-08-14 DIAGNOSIS — Z885 Allergy status to narcotic agent status: Secondary | ICD-10-CM | POA: Diagnosis not present

## 2016-08-14 DIAGNOSIS — Z801 Family history of malignant neoplasm of trachea, bronchus and lung: Secondary | ICD-10-CM | POA: Diagnosis not present

## 2016-08-14 DIAGNOSIS — Z803 Family history of malignant neoplasm of breast: Secondary | ICD-10-CM | POA: Diagnosis not present

## 2016-08-14 DIAGNOSIS — Z170 Estrogen receptor positive status [ER+]: Secondary | ICD-10-CM | POA: Diagnosis not present

## 2016-08-14 NOTE — Progress Notes (Signed)
Radiation Oncology         [phone] ________________________________  Name: Grace Moyer MRN: [id number]  Date: 08/14/2016  DOB: [birth year]  WU:[id number], Elaina Hoops, MD  Truitt Merle, MD     REFERRING PHYSICIAN: Truitt Merle, MD   DIAGNOSIS: The encounter diagnosis was Malignant neoplasm of upper-outer quadrant of left breast in female, estrogen receptor negative ([place]).   HISTORY OF PRESENT ILLNESS: Grace Moyer is a 56 y.o. female originally seen in multidisciplinary breast clinic for a new diagnosis of left sided breast cancer. The patient went for screening mammogram and on 01/18/16 returned for evaluation of a possible breast mass. Diagnostic imaging including mammogram and ultrasound revealed a 2.9 cm spiculated mass. In the upper outer quadrant. Ultrasound measured this as 2.2 x 2.5 x 3.2cm with axillary adenopathy measuring 2.5 cm with a 1.1 cm cortex. A biopsy of both the breast mass and axillary node were performed on 01/29/16 and pathology revealed triple negative (Ki67 85%) grade 3 invasive ductal carcinoma with LVSI. Biopsy of the lymph node confirmed metastatic disease, also ER/HER2 negative with PR receptor of <1%.   She began chemotherapy with Cytoxan, Adriamycin in December 2017, and transitioned to Carboplatin/Taxol in February 2018. She completed 12 cycles of this on 07/02/16. She subsequently underwent MRI of the breast on 07/11/16 which revealed complete radiographic response in the breast and node. She underwent left lumpectomy with sentinel node evaluation and her specimen revealed atypical lobular neoplasia without malignancy. She also underwent bilateral mammoplasty on 08/05/16 which did not reveal any malignancy in the specimens. She comes today to discuss options of beginning her adjuvant radiotherapy.  PREVIOUS RADIATION THERAPY: No   PAST MEDICAL HISTORY:  Past Medical History:  Diagnosis Date  . Anxiety   . Family history of breast cancer   . Family history of colon  cancer   . Malignant neoplasm of upper-outer quadrant of left female breast ([place]) 01/31/2016  . Neuromyelitis optica ([place])        PAST SURGICAL HISTORY: Past Surgical History:  Procedure Laterality Date  . ABDOMINAL HYSTERECTOMY     partial  . BREAST LUMPECTOMY WITH RADIOACTIVE SEED AND SENTINEL LYMPH NODE BIOPSY Left 07/31/2016   Procedure: LEFT BREAST RADIOACTIVE SEED GUIDED LUMPECTOMY WITH LEFT RADIOACTIVE SEED TARGETED AXILLARY SENTINEL LYMPH NODE EXCISION AND SENTINEL LYMPH NODE BIOPSY;  Surgeon: Rolm Bookbinder, MD;  Location: [facility];  Service: General;  Laterality: Left;  . BREAST REDUCTION SURGERY Bilateral 08/05/2016   Procedure: LEFT ONCOPLASTIC REDUCTION; RIGHT BREAST REDUCTION;  Surgeon: Irene Limbo, MD;  Location: [facility];  Service: Plastics;  Laterality: Bilateral;  . EYE SURGERY    . PORT-A-CATH REMOVAL Right 07/31/2016   Procedure: REMOVAL PORT-A-CATH;  Surgeon: Rolm Bookbinder, MD;  Location: [facility];  Service: General;  Laterality: Right;  . PORTACATH PLACEMENT Right 02/11/2016   Procedure: INSERTION PORT-A-CATH WITH [place];  Surgeon: Rolm Bookbinder, MD;  Location: [facility];  Service: General;  Laterality: Right;     FAMILY HISTORY:  Family History  Problem Relation Age of Onset  . Breast cancer Maternal Grandmother        dx in her 72s  . CAD Paternal Grandmother   . Colon cancer Paternal Uncle        dx in his 65s  . COPD Maternal Grandfather   . CAD Paternal Grandfather   . Breast cancer Cousin        dx in her early to mid  71s; maternal first cousin  . Lung cancer Paternal Uncle      SOCIAL HISTORY:  reports that she quit smoking about 14 years ago. Her smoking use included Cigarettes. She has never used smokeless tobacco. She reports that she drinks alcohol. She reports that she does not use drugs. 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   ALLERGIES: Hydrocodone   MEDICATIONS:  No current facility-administered medications for this encounter.    Current Outpatient Prescriptions  Medication Sig Dispense Refill  . Cholecalciferol (VITAMIN D-1000 MAX ST) 1000 units tablet Take 2 tablets by mouth daily.    Marland Kitchen oxyCODONE (OXY IR/ROXICODONE) 5 MG immediate release tablet Take 1-2 tablets (5-10 mg total) by mouth every 4 (four) hours as needed (for pain score of 1-4). 30 tablet 0  . riTUXimab (RITUXAN) 100 MG/10ML injection Inject into the vein every 4 (four) months.    Marland Kitchen oxyCODONE-acetaminophen (PERCOCET) 10-325 MG tablet Take 1 tablet by mouth every 6 (six) hours as needed for pain. (Patient not taking: Reported on 08/14/2016) 20 tablet 0   Facility-Administered Medications Ordered in Other Encounters  Medication Dose Route Frequency Provider Last Rate Last Dose  . fentaNYL (SUBLIMAZE) injection 50 mcg  50 mcg Intravenous Q1H PRN Wyn Quaker W, PA-C      . heparin lock flush 100 unit/mL  500 Units Intravenous Once PRN Truitt Merle, MD      . sodium chloride flush (NS) 0.9 % injection 10 mL  10 mL Intracatheter PRN Truitt Merle, MD   10 mL at 06/18/16 1540  . sodium chloride flush (NS) 0.9 % injection 10 mL  10 mL Intravenous PRN Truitt Merle, MD      . sodium chloride flush (NS) 0.9 % injection 10 mL  10 mL Intravenous PRN Truitt Merle, MD   10 mL at 06/25/16 1106  . vancomycin (VANCOCIN) 1,500 mg in sodium chloride 0.9 % 500 mL IVPB  1,500 mg Intravenous Once Rebecka Apley, RPH 250 mL/hr at 08/15/16 1317 1,500 mg at 08/15/16 1317     REVIEW OF SYSTEMS: On review of systems, the patient reports that she is doing well overall. She denies any chest pain, shortness of breath, cough, fevers, chills, night sweats, unintended weight changes. She reports her incision sites are healing well. She denies any bowel or bladder disturbances, and denies abdominal pain, nausea or vomiting. She continues to  have neuropathic pain in her fingertips but denies any new musculoskeletal or joint aches or pains, new skin lesions or concerns. A complete review of systems is obtained and is otherwise negative.      PHYSICAL EXAM:  Wt Readings from Last 3 Encounters:  08/15/16 188 lb (85.3 kg)  08/14/16 190 lb 6.4 oz (86.4 kg)  08/05/16 192 lb 6.4 oz (87.3 kg)   Temp Readings from Last 3 Encounters:  08/15/16 100.2 F (37.9 C) (Oral)  08/14/16 97.9 F (36.6 C) (Oral)  08/05/16 97.8 F (36.6 C)   BP Readings from Last 3 Encounters:  08/15/16 106/68  08/14/16 116/74  08/05/16 (!) 141/71   Pulse Readings from Last 3 Encounters:  08/15/16 85  08/14/16 80  08/05/16 68     Pain scale 0/10 In general this is a well appearing caucasian female in no acute distress. She's alert and oriented x4 and appropriate throughout the examination. Cardiopulmonary assessment is negative for acute distress and she exhibits normal effort. The left  breast is evaluated and reveals a well healed keyhole incision and axillary incision. No erythema, separation or bleeding is noted. The right breast incision is also intact without focal anomalies.   ECOG = 0  0 - Asymptomatic (Fully active, able to carry on all predisease activities without restriction)  1 - Symptomatic but completely ambulatory (Restricted in physically strenuous activity but ambulatory and able to carry out work of a light or sedentary nature. For example, light housework, office work)  2 - Symptomatic, <50% in bed during the day (Ambulatory and capable of all self care but unable to carry out any work activities. Up and about more than 50% of waking hours)  3 - Symptomatic, >50% in bed, but not bedbound (Capable of only limited self-care, confined to bed or chair 50% or more of waking hours)  4 - Bedbound (Completely disabled. Cannot carry on any self-care. Totally confined to bed or chair)  5 - Death   Eustace Pen MM, Creech RH, Tormey DC, et al.  [phone]). "Toxicity and response criteria of the [facility] Group". [place] Oncol. 5 (6): 649-55    LABORATORY DATA:  Lab Results  Component Value Date   WBC 9.1 08/15/2016   HGB 10.5 (L) 08/15/2016   HCT 31.0 (L) 08/15/2016   MCV 93.5 08/15/2016   PLT 200 08/15/2016   Lab Results  Component Value Date   NA 137 08/15/2016   K 3.9 08/15/2016   CL 102 08/15/2016   CO2 21 (L) 08/15/2016   Lab Results  Component Value Date   ALT 41 08/15/2016   AST 38 08/15/2016   ALKPHOS 182 (H) 08/15/2016   BILITOT 1.1 08/15/2016      RADIOGRAPHY: Nm Sentinel Node Inj-no Rpt (breast)  Result Date: 08/15/2016 There is no Radiologist interpretation  for this exam.  Mm Breast Surgical Specimen  Result Date: 07/31/2016 CLINICAL DATA:  Patient status post left lymph node resection. EXAM: SPECIMEN RADIOGRAPH OF THE LEFT BREAST COMPARISON:  Previous exam(s). FINDINGS: Status post excision of the left axillary lymph node. The radioactive seed and biopsy marker clip are present, completely intact, and were marked for pathology. IMPRESSION: Specimen radiograph of the left axillary lymph node. Electronically Signed   By: Lovey Newcomer M.D.   On: 07/31/2016 14:44   Mm Breast Surgical Specimen  Result Date: 07/31/2016 CLINICAL DATA:  56 year old female -evaluate surgical specimen following left lumpectomy for breast cancer. EXAM: SPECIMEN RADIOGRAPH OF THE LEFT BREAST COMPARISON:  Previous exam(s). FINDINGS: Status post excision of the left breast. The radioactive seed and biopsy marker clip are present, completely intact, and were marked for pathology. IMPRESSION: Specimen radiograph of the left breast. Electronically Signed   By: Margarette Canada M.D.   On: 07/31/2016 14:34   Dg Chest Portable 1 View  Result Date: 08/15/2016 CLINICAL DATA:  Shortness of Breath EXAM: PORTABLE CHEST 1 VIEW COMPARISON:  02/11/2016 FINDINGS: Cardiac shadow is within normal limits. The lungs are well aerated  bilaterally. No focal infiltrate or sizable effusion is seen. Previously noted right chest wall port has been removed. Postsurgical changes in the left breast are noted. IMPRESSION: No acute abnormality noted. Electronically Signed   By: Inez Catalina M.D.   On: 08/15/2016 13:02   Mm Lt Radioactive Seed Loc Mammo Guide  Result Date: 07/30/2016 CLINICAL DATA:  Localize left breast cancer. EXAM: MAMMOGRAPHIC GUIDED RADIOACTIVE SEED LOCALIZATION OF THE LEFT BREAST COMPARISON:  Previous exam(s). FINDINGS: Patient presents for radioactive seed localization prior to surgery.  I met with the patient and we discussed the procedure of seed localization including benefits and alternatives. We discussed the high likelihood of a successful procedure. We discussed the risks of the procedure including infection, bleeding, tissue injury and further surgery. We discussed the low dose of radioactivity involved in the procedure. Informed, written consent was given. The usual time-out protocol was performed immediately prior to the procedure. Using mammographic guidance, sterile technique, 1% lidocaine and an I-125 radioactive seed, the biopsy clip was localized using a lateral approach. The follow-up mammogram images confirm the seed in the expected location and were marked for the surgeon. Follow-up survey of the patient confirms presence of the radioactive seed. Order number of I-125 seed:  [id number]. Total activity:  8.295 millicuries  Reference Date: June 27, 2016 The patient tolerated the procedure well and was released from the [facility]. She was given instructions regarding seed removal. IMPRESSION: Radioactive seed localization left breast. No apparent complications. Electronically Signed   By: Dorise Bullion III M.D   On: 07/30/2016 14:07   [place] Lt Radioactive Seed Loc  Result Date: 07/30/2016 CLINICAL DATA:  Localize left axillary lymph node. EXAM: ULTRASOUND GUIDED RADIOACTIVE SEED LOCALIZATION OF THE LEFT AXILLA  COMPARISON:  Previous exam(s). FINDINGS: Patient presents for radioactive seed localization prior to surgery. I met with the patient and we discussed the procedure of seed localization including benefits and alternatives. We discussed the high likelihood of a successful procedure. We discussed the risks of the procedure including infection, bleeding, tissue injury and further surgery. We discussed the low dose of radioactivity involved in the procedure. Informed, written consent was given. The usual time-out protocol was performed immediately prior to the procedure. Using ultrasound guidance, sterile technique, 1% lidocaine and an I-125 radioactive seed, the left axillary lymph node marked with a HydroMARK was localized using a lateral approach. The follow-up mammogram images confirm the seed in the expected location and were marked for the surgeon. Follow-up survey of the patient confirms presence of the radioactive seed. Order number of I-125 seed:  [id number]. Total activity: 8.469 millicuries Reference Date: Jul 23, 2016 The patient tolerated the procedure well and was released from the [facility]. She was given instructions regarding seed removal. IMPRESSION: Radioactive seed localization left axilla. No apparent complications. Electronically Signed   By: Dorise Bullion III M.D   On: 07/30/2016 14:16       IMPRESSION/PLAN: 1. Stage IIB, T2N1M0 triple negative invasive ductal carcinoma of the left breast. Dr. Lisbeth Renshaw discusses the pathology findings and reviews the nature of invasive triple negativedisease. The patient has completed chemotherapy and surgery, and is ready to move forward wtih external radiotherapy to the breast. We discussed the risks, benefits, short, and long term effects of radiotherapy, and the patient is interested in proceeding. Dr. Lisbeth Renshaw discusses the delivery and logistics of radiotherapy and would recommend 6 1/2 weeks of treatment with deep inspiration breath hold technique.  Written consent is obtained and placed in the chart, a copy was provided to the patient. She will proceed with radiotherapy simulation on 09/02/16 to allow for more healing of her incision site.  In a visit lasting 25 minutes, greater than 50% of the time was spent face to face discussing the role of therapy, and coordinating the patient's care.  The above documentation reflects my direct findings during this shared patient visit. Please see the separate note by Dr. Lisbeth Renshaw on this date for the remainder of the patient's plan of care.    Bryson Ha  Margurite Auerbach, PAC

## 2016-08-15 ENCOUNTER — Other Ambulatory Visit: Payer: Self-pay

## 2016-08-15 ENCOUNTER — Telehealth: Payer: Self-pay | Admitting: *Deleted

## 2016-08-15 ENCOUNTER — Emergency Department (HOSPITAL_COMMUNITY): Payer: 59

## 2016-08-15 ENCOUNTER — Inpatient Hospital Stay (HOSPITAL_COMMUNITY)
Admission: EM | Admit: 2016-08-15 | Discharge: 2016-08-18 | DRG: 176 | Disposition: A | Discharge status: Home / Self Care | Payer: 59 | Attending: Internal Medicine | Admitting: Internal Medicine

## 2016-08-15 ENCOUNTER — Other Ambulatory Visit: Payer: 59

## 2016-08-15 ENCOUNTER — Ambulatory Visit: Payer: 59 | Admitting: Hematology

## 2016-08-15 DIAGNOSIS — Z87891 Personal history of nicotine dependence: Secondary | ICD-10-CM | POA: Diagnosis not present

## 2016-08-15 DIAGNOSIS — I82492 Acute embolism and thrombosis of other specified deep vein of left lower extremity: Secondary | ICD-10-CM | POA: Diagnosis present

## 2016-08-15 DIAGNOSIS — R0602 Shortness of breath: Secondary | ICD-10-CM | POA: Diagnosis not present

## 2016-08-15 DIAGNOSIS — R0789 Other chest pain: Secondary | ICD-10-CM | POA: Diagnosis present

## 2016-08-15 DIAGNOSIS — R9401 Abnormal electroencephalogram [EEG]: Secondary | ICD-10-CM | POA: Diagnosis not present

## 2016-08-15 DIAGNOSIS — I82432 Acute embolism and thrombosis of left popliteal vein: Secondary | ICD-10-CM | POA: Diagnosis present

## 2016-08-15 DIAGNOSIS — Z9221 Personal history of antineoplastic chemotherapy: Secondary | ICD-10-CM

## 2016-08-15 DIAGNOSIS — Z803 Family history of malignant neoplasm of breast: Secondary | ICD-10-CM | POA: Diagnosis not present

## 2016-08-15 DIAGNOSIS — I2699 Other pulmonary embolism without acute cor pulmonale: Principal | ICD-10-CM | POA: Diagnosis present

## 2016-08-15 DIAGNOSIS — Z171 Estrogen receptor negative status [ER-]: Secondary | ICD-10-CM | POA: Diagnosis not present

## 2016-08-15 DIAGNOSIS — I82412 Acute embolism and thrombosis of left femoral vein: Secondary | ICD-10-CM | POA: Diagnosis present

## 2016-08-15 DIAGNOSIS — I2609 Other pulmonary embolism with acute cor pulmonale: Secondary | ICD-10-CM

## 2016-08-15 DIAGNOSIS — G629 Polyneuropathy, unspecified: Secondary | ICD-10-CM | POA: Diagnosis present

## 2016-08-15 DIAGNOSIS — C50412 Malignant neoplasm of upper-outer quadrant of left female breast: Secondary | ICD-10-CM | POA: Diagnosis not present

## 2016-08-15 DIAGNOSIS — I824Z2 Acute embolism and thrombosis of unspecified deep veins of left distal lower extremity: Secondary | ICD-10-CM | POA: Diagnosis not present

## 2016-08-15 DIAGNOSIS — R52 Pain, unspecified: Secondary | ICD-10-CM | POA: Diagnosis not present

## 2016-08-15 DIAGNOSIS — Z90710 Acquired absence of both cervix and uterus: Secondary | ICD-10-CM | POA: Diagnosis not present

## 2016-08-15 DIAGNOSIS — M79669 Pain in unspecified lower leg: Secondary | ICD-10-CM

## 2016-08-15 DIAGNOSIS — Z86711 Personal history of pulmonary embolism: Secondary | ICD-10-CM | POA: Diagnosis not present

## 2016-08-15 DIAGNOSIS — I2602 Saddle embolus of pulmonary artery with acute cor pulmonale: Secondary | ICD-10-CM | POA: Diagnosis not present

## 2016-08-15 DIAGNOSIS — I82442 Acute embolism and thrombosis of left tibial vein: Secondary | ICD-10-CM | POA: Diagnosis present

## 2016-08-15 DIAGNOSIS — I824Z1 Acute embolism and thrombosis of unspecified deep veins of right distal lower extremity: Secondary | ICD-10-CM | POA: Diagnosis not present

## 2016-08-15 LAB — CBC WITH DIFFERENTIAL/PLATELET
Basophils Absolute: 0 10*3/uL (ref 0.0–0.1)
Basophils Relative: 0 %
Eosinophils Absolute: 0 10*3/uL (ref 0.0–0.7)
Eosinophils Relative: 0 %
HCT: 33 % — ABNORMAL LOW (ref 36.0–46.0)
Hemoglobin: 10.9 g/dL — ABNORMAL LOW (ref 12.0–15.0)
Lymphocytes Relative: 7 %
Lymphs Abs: 0.6 10*3/uL — ABNORMAL LOW (ref 0.7–4.0)
MCH: 30.9 pg (ref 26.0–34.0)
MCHC: 33 g/dL (ref 30.0–36.0)
MCV: 93.5 fL (ref 78.0–100.0)
Monocytes Absolute: 0.6 10*3/uL (ref 0.1–1.0)
Monocytes Relative: 7 %
Neutro Abs: 7.8 10*3/uL — ABNORMAL HIGH (ref 1.7–7.7)
Neutrophils Relative %: 86 %
Platelets: 200 10*3/uL (ref 150–400)
RBC: 3.53 MIL/uL — ABNORMAL LOW (ref 3.87–5.11)
RDW: 12.6 % (ref 11.5–15.5)
WBC: 9.1 10*3/uL (ref 4.0–10.5)

## 2016-08-15 LAB — I-STAT TROPONIN, ED: Troponin i, poc: 0.01 ng/mL (ref 0.00–0.08)

## 2016-08-15 LAB — COMPREHENSIVE METABOLIC PANEL
ALT: 41 U/L (ref 14–54)
AST: 38 U/L (ref 15–41)
Albumin: 3.8 g/dL (ref 3.5–5.0)
Alkaline Phosphatase: 182 U/L — ABNORMAL HIGH (ref 38–126)
Anion gap: 9 (ref 5–15)
BUN: 10 mg/dL (ref 6–20)
CO2: 21 mmol/L — ABNORMAL LOW (ref 22–32)
Calcium: 9.2 mg/dL (ref 8.9–10.3)
Chloride: 104 mmol/L (ref 101–111)
Creatinine, Ser: 0.74 mg/dL (ref 0.44–1.00)
GFR calc Af Amer: >60 mL/min (ref >60)
GFR calc non Af Amer: >60 mL/min (ref >60)
Glucose, Bld: 114 mg/dL — ABNORMAL HIGH (ref 65–99)
Potassium: 3.9 mmol/L (ref 3.5–5.1)
Sodium: 134 mmol/L — ABNORMAL LOW (ref 135–145)
Total Bilirubin: 1.1 mg/dL (ref 0.3–1.2)
Total Protein: 6.6 g/dL (ref 6.5–8.1)

## 2016-08-15 LAB — I-STAT CHEM 8, ED
BUN: 11 mg/dL (ref 6–20)
Calcium, Ion: 1.16 mmol/L (ref 1.15–1.40)
Chloride: 102 mmol/L (ref 101–111)
Creatinine, Ser: 0.7 mg/dL (ref 0.44–1.00)
Glucose, Bld: 111 mg/dL — ABNORMAL HIGH (ref 65–99)
HCT: 31 % — ABNORMAL LOW (ref 36.0–46.0)
Hemoglobin: 10.5 g/dL — ABNORMAL LOW (ref 12.0–15.0)
Potassium: 3.9 mmol/L (ref 3.5–5.1)
Sodium: 137 mmol/L (ref 135–145)
TCO2: 25 mmol/L (ref 0–100)

## 2016-08-15 LAB — MRSA PCR SCREENING: MRSA by PCR: NEGATIVE

## 2016-08-15 LAB — MAGNESIUM: Magnesium: 1.8 mg/dL (ref 1.7–2.4)

## 2016-08-15 LAB — I-STAT CG4 LACTIC ACID, ED: Lactic Acid, Venous: 1.2 mmol/L (ref 0.5–1.9)

## 2016-08-15 LAB — BRAIN NATRIURETIC PEPTIDE: B Natriuretic Peptide: 31.1 pg/mL (ref 0.0–100.0)

## 2016-08-15 LAB — TROPONIN I: Troponin I: <0.03 ng/mL (ref <0.03)

## 2016-08-15 MED ORDER — SODIUM CHLORIDE 0.9 % IV SOLN: 0.0000 ug/kg/h | INTRAVENOUS | Status: DC

## 2016-08-15 MED ORDER — ACETAMINOPHEN 325 MG PO TABS: 650.0000 mg | ORAL_TABLET | Freq: Four times a day (QID) | ORAL | Status: DC | PRN

## 2016-08-15 MED ORDER — LEVALBUTEROL HCL 0.63 MG/3ML IN NEBU: 0.6300 mg | INHALATION_SOLUTION | Freq: Four times a day (QID) | RESPIRATORY_TRACT | Status: DC | PRN

## 2016-08-15 MED ORDER — FENTANYL CITRATE (PF) 100 MCG/2ML IJ SOLN: 100.0000 ug | INTRAMUSCULAR | Status: DC | PRN

## 2016-08-15 MED ORDER — VANCOMYCIN HCL 10 G IV SOLR
1500.0000 mg | Freq: Once | INTRAVENOUS | Status: AC
  Administered 2016-08-15: 1500 mg via INTRAVENOUS
  Filled 2016-08-15: qty 1500

## 2016-08-15 MED ORDER — POLYETHYLENE GLYCOL 3350 17 G PO PACK
17.0000 g | PACK | Freq: Every day | ORAL | Status: DC | PRN
Start: 2016-08-15 — End: 2016-08-18

## 2016-08-15 MED ORDER — SODIUM CHLORIDE 0.9 % IV BOLUS (SEPSIS)
500.0000 mL | Freq: Once | INTRAVENOUS | Status: AC
  Administered 2016-08-15: 500 mL via INTRAVENOUS

## 2016-08-15 MED ORDER — ACETAMINOPHEN 650 MG RE SUPP: 650.0000 mg | Freq: Four times a day (QID) | RECTAL | Status: DC | PRN

## 2016-08-15 MED ORDER — FENTANYL CITRATE (PF) 100 MCG/2ML IJ SOLN
100.0000 ug | INTRAMUSCULAR | Status: DC | PRN
Start: 2016-08-15 — End: 2016-08-15

## 2016-08-15 MED ORDER — OXYCODONE HCL 5 MG PO TABS
5.0000 mg | ORAL_TABLET | ORAL | Status: DC | PRN
  Administered 2016-08-15 – 2016-08-16 (×4): 10 mg via ORAL
  Filled 2016-08-15 (×4): qty 2

## 2016-08-15 MED ORDER — HYDROMORPHONE HCL 1 MG/ML IJ SOLN
1.0000 mg | INTRAMUSCULAR | Status: DC | PRN
  Administered 2016-08-15: 1 mg via INTRAVENOUS
  Administered 2016-08-16 (×2): 1.5 mg via INTRAVENOUS
  Filled 2016-08-15: qty 1.5
  Filled 2016-08-15: qty 1
  Filled 2016-08-15: qty 1.5

## 2016-08-15 MED ORDER — ENSURE ENLIVE PO LIQD
237.0000 mL | Freq: Two times a day (BID) | ORAL | Status: DC
  Administered 2016-08-16: 237 mL via ORAL

## 2016-08-15 MED ORDER — HYDROMORPHONE HCL 1 MG/ML IJ SOLN
1.0000 mg | INTRAMUSCULAR | Status: DC | PRN
Start: 2016-08-15 — End: 2016-08-15
  Administered 2016-08-15: 1 mg via INTRAVENOUS
  Filled 2016-08-15: qty 1

## 2016-08-15 MED ORDER — IOPAMIDOL (ISOVUE-370) INJECTION 76%
INTRAVENOUS | Status: AC
  Administered 2016-08-15: 100 mL
  Filled 2016-08-15: qty 100

## 2016-08-15 MED ORDER — ONDANSETRON HCL 4 MG/2ML IJ SOLN: 4.0000 mg | Freq: Four times a day (QID) | INTRAMUSCULAR | Status: DC | PRN

## 2016-08-15 MED ORDER — FENTANYL CITRATE (PF) 100 MCG/2ML IJ SOLN
50.0000 ug | Freq: Once | INTRAMUSCULAR | Status: AC
  Administered 2016-08-15: 50 ug via INTRAVENOUS
  Filled 2016-08-15: qty 2

## 2016-08-15 MED ORDER — ALBUTEROL SULFATE (2.5 MG/3ML) 0.083% IN NEBU
5.0000 mg | INHALATION_SOLUTION | Freq: Once | RESPIRATORY_TRACT | Status: DC
  Filled 2016-08-15: qty 6

## 2016-08-15 MED ORDER — ONDANSETRON HCL 4 MG PO TABS: 4.0000 mg | ORAL_TABLET | Freq: Four times a day (QID) | ORAL | Status: DC | PRN

## 2016-08-15 MED ORDER — FENTANYL CITRATE (PF) 100 MCG/2ML IJ SOLN
50.0000 ug | INTRAMUSCULAR | Status: DC | PRN
  Administered 2016-08-15 (×3): 50 ug via INTRAVENOUS
  Filled 2016-08-15 (×3): qty 2

## 2016-08-15 MED ORDER — HEPARIN (PORCINE) IN NACL 100-0.45 UNIT/ML-% IJ SOLN
1500.0000 [IU]/h | INTRAMUSCULAR | Status: DC
Start: 2016-08-15 — End: 2016-08-18
  Administered 2016-08-15: 1200 [IU]/h via INTRAVENOUS
  Administered 2016-08-16: 1350 [IU]/h via INTRAVENOUS
  Administered 2016-08-16 – 2016-08-17 (×2): 1500 [IU]/h via INTRAVENOUS
  Filled 2016-08-15 (×5): qty 250

## 2016-08-15 MED ORDER — HEPARIN BOLUS VIA INFUSION
4000.0000 [IU] | Freq: Once | INTRAVENOUS | Status: AC
  Administered 2016-08-15: 4000 [IU] via INTRAVENOUS
  Filled 2016-08-15: qty 4000

## 2016-08-15 MED ORDER — PIPERACILLIN-TAZOBACTAM 3.375 G IVPB
3.3750 g | Freq: Three times a day (TID) | INTRAVENOUS | Status: DC
  Administered 2016-08-15 – 2016-08-17 (×6): 3.375 g via INTRAVENOUS
  Filled 2016-08-15 (×8): qty 50

## 2016-08-15 MED ORDER — VANCOMYCIN HCL IN DEXTROSE 1-5 GM/200ML-% IV SOLN: 1000.0000 mg | Freq: Once | INTRAVENOUS | Status: DC

## 2016-08-15 MED ORDER — PIPERACILLIN-TAZOBACTAM 3.375 G IVPB 30 MIN
3.3750 g | Freq: Once | INTRAVENOUS | Status: AC
  Administered 2016-08-15: 3.375 g via INTRAVENOUS
  Filled 2016-08-15: qty 50

## 2016-08-15 MED ORDER — OXYCODONE HCL 5 MG PO TABS
5.0000 mg | ORAL_TABLET | ORAL | Status: DC | PRN
  Administered 2016-08-15: 5 mg via ORAL
  Filled 2016-08-15: qty 1

## 2016-08-15 MED ORDER — MIDAZOLAM HCL 2 MG/2ML IJ SOLN: 2.0000 mg | Freq: Once | INTRAMUSCULAR | Status: DC

## 2016-08-15 MED ORDER — VANCOMYCIN HCL IN DEXTROSE 1-5 GM/200ML-% IV SOLN
1000.0000 mg | Freq: Three times a day (TID) | INTRAVENOUS | Status: DC
  Administered 2016-08-15 – 2016-08-17 (×5): 1000 mg via INTRAVENOUS
  Filled 2016-08-15 (×7): qty 200

## 2016-08-15 NOTE — Progress Notes (Signed)
Pt. Claimed that oxycodone is taking a while to take effect and wanted  iv pain med. MD aware with order. Continue to monitor.

## 2016-08-15 NOTE — ED Triage Notes (Addendum)
Pt reports SOB during the night, reports chest tube removed from right side Wednesday.  Pt reports ongoing  L calf pain.  Pt sent from MD's office for further eval. Pt gasping, reports last taking one percocet at 8am. Breath sounds ausculted in all lobes.

## 2016-08-15 NOTE — Telephone Encounter (Signed)
Received call from Dr. Donne Hazel pt being sent to ED for r/o PE. Informed Dr. Burr Medico pt will not make appt with her at 3:00pm and of possible r/o PE. Appts cx.

## 2016-08-15 NOTE — ED Notes (Signed)
Per MD and PA no bolus at this time.

## 2016-08-15 NOTE — Progress Notes (Signed)
Dr. Loleta Books notified of pts pain reassessment, orders received

## 2016-08-15 NOTE — ED Notes (Signed)
PT did not have to use RR at this time  

## 2016-08-15 NOTE — ED Notes (Signed)
Attempted report 

## 2016-08-15 NOTE — H&P (Addendum)
[facility] History and Physical  AMSI GRIMLEY XWR:[id number] DOB: Oct 17, 1960 DOA: 08/15/2016  Referring physician:   PCP: Maisie Fus, MD   Chief Complaint: Shortness of breath  HPI:  56 year old female with a history of breast cancer status post lumpectomy and reconstruction followed by completion of neoadjuvant chemotherapy, sent to the ED for evaluation of shortness of breath and left leg pain. Patient also had a low-grade fever, chest pain with deep inspiration, left calf pain for the last 3-4 days. ED course Temperature 100.2, blood pressure 100 /64 pulse 74, respirations 29,100% on 3 L CT chest showed bilateral lower lobe segmental PE, developing pulmonary infarcts, right heart strain, Patient received vancomycin/Zosyn, started on a heparin drip and admitted to step down for acute PE with right heart strain  Review of Systems: negative for the following  Constitutional: Positive for fever, chills, diaphoresis, appetite change and fatigue.  HEENT: Denies photophobia, eye pain, redness, hearing loss, ear pain, congestion, sore throat, rhinorrhea, sneezing, mouth sores, trouble swallowing, neck pain, neck stiffness and tinnitus.  Respiratory: Positive for SOB, DOE, cough, chest tightness, and wheezing.  Cardiovascular: Positive for chest pain, palpitations and leg swelling.  Gastrointestinal: Denies nausea, vomiting, abdominal pain, diarrhea, constipation, blood in stool and abdominal distention.  Genitourinary: Denies dysuria, urgency, frequency, hematuria, flank pain and difficulty urinating.  Musculoskeletal: Denies myalgias, back pain, joint swelling, arthralgias and gait problem.  Skin: Denies pallor, rash and wound.  Neurological: Denies dizziness, seizures, syncope, weakness, light-headedness, numbness and headaches.  Hematological: Denies adenopathy. Easy bruising, personal or family bleeding history  Psychiatric/Behavioral: Denies suicidal ideation, mood changes,  confusion, nervousness, sleep disturbance and agitation       Past Medical History:  Diagnosis Date  . Anxiety   . Family history of breast cancer   . Family history of colon cancer   . Malignant neoplasm of upper-outer quadrant of left female breast ([place]) 01/31/2016  . Neuromyelitis optica ([place])      Past Surgical History:  Procedure Laterality Date  . ABDOMINAL HYSTERECTOMY     partial  . BREAST LUMPECTOMY WITH RADIOACTIVE SEED AND SENTINEL LYMPH NODE BIOPSY Left 07/31/2016   Procedure: LEFT BREAST RADIOACTIVE SEED GUIDED LUMPECTOMY WITH LEFT RADIOACTIVE SEED TARGETED AXILLARY SENTINEL LYMPH NODE EXCISION AND SENTINEL LYMPH NODE BIOPSY;  Surgeon: Rolm Bookbinder, MD;  Location: [place];  Service: General;  Laterality: Left;  . BREAST REDUCTION SURGERY Bilateral 08/05/2016   Procedure: LEFT ONCOPLASTIC REDUCTION; RIGHT BREAST REDUCTION;  Surgeon: Irene Limbo, MD;  Location: [place];  Service: Plastics;  Laterality: Bilateral;  . EYE SURGERY    . PORT-A-CATH REMOVAL Right 07/31/2016   Procedure: REMOVAL PORT-A-CATH;  Surgeon: Rolm Bookbinder, MD;  Location: [place];  Service: General;  Laterality: Right;  . PORTACATH PLACEMENT Right 02/11/2016   Procedure: INSERTION PORT-A-CATH WITH [place];  Surgeon: Rolm Bookbinder, MD;  Location: [place];  Service: General;  Laterality: Right;      Social History:  reports that she quit smoking about 14 years ago. Her smoking use included Cigarettes. She has never used smokeless tobacco. She reports that she drinks alcohol. She reports that she does not use drugs.    Allergies  Allergen Reactions  . Hydrocodone Other (See Comments)    Did not help with pain and kept her up all night    Family History  Problem Relation Age of Onset  . Breast cancer Maternal Grandmother        dx  in her 37s  . CAD Paternal Grandmother   . Colon cancer Paternal Uncle        dx  in his 34s  . COPD Maternal Grandfather   . CAD Paternal Grandfather   . Breast cancer Cousin        dx in her early to mid 70s; maternal first cousin  . Lung cancer Paternal Uncle          Prior to Admission medications   Medication Sig Start Date End Date Taking? Authorizing Provider  Cholecalciferol (VITAMIN D-1000 MAX ST) 1000 units tablet Take 2 tablets by mouth daily.   Yes [provider]  oxyCODONE (OXY IR/ROXICODONE) 5 MG immediate release tablet Take 1-2 tablets (5-10 mg total) by mouth every 4 (four) hours as needed (for pain score of 1-4). 08/05/16  Yes Irene Limbo, MD  oxyCODONE-acetaminophen (PERCOCET) 10-325 MG tablet Take 1 tablet by mouth every 6 (six) hours as needed for pain. 07/31/16 07/31/17 Yes Rolm Bookbinder, MD  riTUXimab (RITUXAN) 100 MG/10ML injection Inject into the vein every 4 (four) months.    [provider]     Physical Exam: Vitals:   08/15/16 1530 08/15/16 1545 08/15/16 1600 08/15/16 1626  BP: 105/62 104/71 106/63 100/64  Pulse: 78 76 81 72  Resp: (!) 28 (!) 26 (!) 21 (!) 29  Temp:      TempSrc:      SpO2: 100% 100% 100% 100%  Weight:      Height:            Vitals:   08/15/16 1530 08/15/16 1545 08/15/16 1600 08/15/16 1626  BP: 105/62 104/71 106/63 100/64  Pulse: 78 76 81 72  Resp: (!) 28 (!) 26 (!) 21 (!) 29  Temp:      TempSrc:      SpO2: 100% 100% 100% 100%  Weight:      Height:       Constitutional: Uncomfortable when she takes in a deep breath Eyes: PERRL, lids and conjunctivae normal ENMT: Mucous membranes are moist. Posterior pharynx clear of any exudate or lesions.Normal dentition.  Neck: normal, supple, no masses, no thyromegaly Respiratory: Increased respiratory effort, crackles bilaterally,   Cardiovascular: Regular rate and rhythm, no murmurs / rubs / gallops. No extremity edema. 2+ pedal pulses. No carotid bruits.  Abdomen: no tenderness, no masses palpated. No hepatosplenomegaly. Bowel sounds  positive.  Musculoskeletal: no clubbing / cyanosis. No joint deformity upper and lower extremities. Good ROM, no contractures. Normal muscle tone.  Skin: no rashes, lesions, ulcers. No induration Neurologic: CN 2-12 grossly intact. Sensation intact, DTR normal. Strength 5/5 in all 4.  Psychiatric: Normal judgment and insight. Alert and oriented x 3. Normal mood.     Labs on Admission: I have personally reviewed following labs and imaging studies  CBC:  Recent Labs Lab 08/15/16 1204 08/15/16 1234  WBC 9.1  --   NEUTROABS 7.8*  --   HGB 10.9* 10.5*  HCT 33.0* 31.0*  MCV 93.5  --   PLT 200  --     Basic Metabolic Panel:  Recent Labs Lab 08/15/16 1204 08/15/16 1234  NA 134* 137  K 3.9 3.9  CL 104 102  CO2 21*  --   GLUCOSE 114* 111*  BUN 10 11  CREATININE 0.74 0.70  CALCIUM 9.2  --     GFR: Estimated Creatinine Clearance: 83.3 mL/min (by C-G formula based on SCr of 0.7 mg/dL).  Liver Function Tests:  Recent Labs Lab 08/15/16  1204  AST 38  ALT 41  ALKPHOS 182*  BILITOT 1.1  PROT 6.6  ALBUMIN 3.8   No results for input(s): LIPASE, AMYLASE in the last 168 hours. No results for input(s): AMMONIA in the last 168 hours.  Coagulation Profile: No results for input(s): INR, PROTIME in the last 168 hours. No results for input(s): DDIMER in the last 72 hours.  Cardiac Enzymes: No results for input(s): CKTOTAL, CKMB, CKMBINDEX, TROPONINI in the last 168 hours.  BNP (last 3 results) No results for input(s): PROBNP in the last 8760 hours.  HbA1C: No results for input(s): HGBA1C in the last 72 hours. No results found for: HGBA1C   CBG: No results for input(s): GLUCAP in the last 168 hours.  Lipid Profile: No results for input(s): CHOL, HDL, LDLCALC, TRIG, CHOLHDL, LDLDIRECT in the last 72 hours.  Thyroid Function Tests: No results for input(s): TSH, T4TOTAL, FREET4, T3FREE, THYROIDAB in the last 72 hours.  Anemia Panel: No results for input(s):  VITAMINB12, FOLATE, FERRITIN, TIBC, IRON, RETICCTPCT in the last 72 hours.  Urine analysis:    Component Value Date/Time   COLORURINE YELLOW 08/14/2015 [place] 08/14/2015 0841   LABSPEC 1.015 08/14/2015 0841   PHURINE 6.5 08/14/2015 0841   GLUCOSEU NEGATIVE 08/14/2015 0841   HGBUR NEGATIVE 08/14/2015 0841   BILIRUBINUR SMALL (A) 08/14/2015 0841   KETONESUR >80 (A) 08/14/2015 0841   PROTEINUR TRACE (A) 08/14/2015 0841   UROBILINOGEN 0.2 04/08/2007 0620   NITRITE NEGATIVE 08/14/2015 0841   LEUKOCYTESUR NEGATIVE 08/14/2015 0841    Sepsis Labs: '@LABRCNTIP'$ (procalcitonin:4,lacticidven:4) )No results found for this or any previous visit (from the past 240 hour(s)).       Radiological Exams on Admission: Ct Angio Chest Pe W/cm &/or Wo Cm  Result Date: 08/15/2016 CLINICAL DATA:  56 year old female with shortness of breath, chest pain radiating to the right back. Left Breast cancer diagnosed in late 2017. EXAM: CT ANGIOGRAPHY CHEST WITH CONTRAST TECHNIQUE: Multidetector CT imaging of the chest was performed using the standard protocol during bolus administration of intravenous contrast. Multiplanar CT image reconstructions and MIPs were obtained to evaluate the vascular anatomy. CONTRAST:  100 mL Isovue 370 COMPARISON:  Portable chest radiograph 1227 hours today. CT chest abdomen and pelvis 02/12/2016. FINDINGS: Cardiovascular: Good contrast bolus timing in the pulmonary arterial tree. Mild to moderate motion artifact. Motion artifact in the right lower lobe where enhancing atelectasis is present in the posterior basal segment of the right lower lobe, but there is a suspicious appearance of the medial basal segment pulmonary artery branch as seen on series 6, image 135. This segment extends to an area of airspace disease which is not enhancing. There is a similar finding in the left lower lobe seen on series 6, image 138, including nonenhancing distal airspace disease. Elsewhere  no convincing convincing filling defect in the pulmonary arteries. Negative visualized aorta. Cardiac size is at the upper limits of normal. RV / LV ratio = 0.9-1 No pericardial effusion. Mediastinum/Nodes: No mediastinal lymphadenopathy. Lungs/Pleura: Low lung volumes. Major airways are patent. Enhancing atelectasis in the posterior basal segment of the right lower lobe, but wedge-shaped nonenhancing airspace disease in both the superior segment of the left lower lobe and medial basal segment of the right lower lobe. Other mild atelectasis elsewhere. Trace right pleural effusion. Upper Abdomen: Negative visualized liver, gallbladder, spleen, pancreas, adrenal glands, kidneys, and bowel in the upper abdomen. Other findings: Postoperative changes to the left breast, including several small areas of  subcutaneous gas. Scattered surgical clips. Musculoskeletal: No acute or suspicious osseous lesion identified. Review of the MIP images confirms the above findings. IMPRESSION: 1. Appearance highly suspicious for bilateral lower lobe segmental pulmonary emboli and developing small pulmonary infarcts. Trace right pleural effusion. 2. Possible right heart strain with an abnormal RV/LV ratio of 0.9-1.0. Consider submassive (intermediate risk) PE, and activation of Code PE by paging [phone]. 3. This was discussed by telephone with Dr. Fredia Sorrow on 08/15/2016 at 15:04 . Electronically Signed   By: Genevie Ann M.D.   On: 08/15/2016 15:05   Dg Chest Portable 1 View  Result Date: 08/15/2016 CLINICAL DATA:  Shortness of Breath EXAM: PORTABLE CHEST 1 VIEW COMPARISON:  02/11/2016 FINDINGS: Cardiac shadow is within normal limits. The lungs are well aerated bilaterally. No focal infiltrate or sizable effusion is seen. Previously noted right chest wall port has been removed. Postsurgical changes in the left breast are noted. IMPRESSION: No acute abnormality noted. Electronically Signed   By: Inez Catalina M.D.   On: 08/15/2016  13:02   Ct Angio Chest Pe W/cm &/or Wo Cm  Result Date: 08/15/2016 CLINICAL DATA:  56 year old female with shortness of breath, chest pain radiating to the right back. Left Breast cancer diagnosed in late 2017. EXAM: CT ANGIOGRAPHY CHEST WITH CONTRAST TECHNIQUE: Multidetector CT imaging of the chest was performed using the standard protocol during bolus administration of intravenous contrast. Multiplanar CT image reconstructions and MIPs were obtained to evaluate the vascular anatomy. CONTRAST:  100 mL Isovue 370 COMPARISON:  Portable chest radiograph 1227 hours today. CT chest abdomen and pelvis 02/12/2016. FINDINGS: Cardiovascular: Good contrast bolus timing in the pulmonary arterial tree. Mild to moderate motion artifact. Motion artifact in the right lower lobe where enhancing atelectasis is present in the posterior basal segment of the right lower lobe, but there is a suspicious appearance of the medial basal segment pulmonary artery branch as seen on series 6, image 135. This segment extends to an area of airspace disease which is not enhancing. There is a similar finding in the left lower lobe seen on series 6, image 138, including nonenhancing distal airspace disease. Elsewhere no convincing convincing filling defect in the pulmonary arteries. Negative visualized aorta. Cardiac size is at the upper limits of normal. RV / LV ratio = 0.9-1 No pericardial effusion. Mediastinum/Nodes: No mediastinal lymphadenopathy. Lungs/Pleura: Low lung volumes. Major airways are patent. Enhancing atelectasis in the posterior basal segment of the right lower lobe, but wedge-shaped nonenhancing airspace disease in both the superior segment of the left lower lobe and medial basal segment of the right lower lobe. Other mild atelectasis elsewhere. Trace right pleural effusion. Upper Abdomen: Negative visualized liver, gallbladder, spleen, pancreas, adrenal glands, kidneys, and bowel in the upper abdomen. Other findings:  Postoperative changes to the left breast, including several small areas of subcutaneous gas. Scattered surgical clips. Musculoskeletal: No acute or suspicious osseous lesion identified. Review of the MIP images confirms the above findings. IMPRESSION: 1. Appearance highly suspicious for bilateral lower lobe segmental pulmonary emboli and developing small pulmonary infarcts. Trace right pleural effusion. 2. Possible right heart strain with an abnormal RV/LV ratio of 0.9-1.0. Consider submassive (intermediate risk) PE, and activation of Code PE by paging [phone]. 3. This was discussed by telephone with Dr. Fredia Sorrow on 08/15/2016 at 15:04 . Electronically Signed   By: Genevie Ann M.D.   On: 08/15/2016 15:05   Nm Sentinel Node Inj-no Rpt (breast)  Result Date: 08/15/2016 There is no Radiologist interpretation  for this exam.  Mm Breast Surgical Specimen  Result Date: 07/31/2016 CLINICAL DATA:  Patient status post left lymph node resection. EXAM: SPECIMEN RADIOGRAPH OF THE LEFT BREAST COMPARISON:  Previous exam(s). FINDINGS: Status post excision of the left axillary lymph node. The radioactive seed and biopsy marker clip are present, completely intact, and were marked for pathology. IMPRESSION: Specimen radiograph of the left axillary lymph node. Electronically Signed   By: Lovey Newcomer M.D.   On: 07/31/2016 14:44   Mm Breast Surgical Specimen  Result Date: 07/31/2016 CLINICAL DATA:  56 year old female -evaluate surgical specimen following left lumpectomy for breast cancer. EXAM: SPECIMEN RADIOGRAPH OF THE LEFT BREAST COMPARISON:  Previous exam(s). FINDINGS: Status post excision of the left breast. The radioactive seed and biopsy marker clip are present, completely intact, and were marked for pathology. IMPRESSION: Specimen radiograph of the left breast. Electronically Signed   By: Margarette Canada M.D.   On: 07/31/2016 14:34   Dg Chest Portable 1 View  Result Date: 08/15/2016 CLINICAL DATA:  Shortness  of Breath EXAM: PORTABLE CHEST 1 VIEW COMPARISON:  02/11/2016 FINDINGS: Cardiac shadow is within normal limits. The lungs are well aerated bilaterally. No focal infiltrate or sizable effusion is seen. Previously noted right chest wall port has been removed. Postsurgical changes in the left breast are noted. IMPRESSION: No acute abnormality noted. Electronically Signed   By: Inez Catalina M.D.   On: 08/15/2016 13:02   Mm Lt Radioactive Seed Loc Mammo Guide  Result Date: 07/30/2016 CLINICAL DATA:  Localize left breast cancer. EXAM: MAMMOGRAPHIC GUIDED RADIOACTIVE SEED LOCALIZATION OF THE LEFT BREAST COMPARISON:  Previous exam(s). FINDINGS: Patient presents for radioactive seed localization prior to surgery. I met with the patient and we discussed the procedure of seed localization including benefits and alternatives. We discussed the high likelihood of a successful procedure. We discussed the risks of the procedure including infection, bleeding, tissue injury and further surgery. We discussed the low dose of radioactivity involved in the procedure. Informed, written consent was given. The usual time-out protocol was performed immediately prior to the procedure. Using mammographic guidance, sterile technique, 1% lidocaine and an I-125 radioactive seed, the biopsy clip was localized using a lateral approach. The follow-up mammogram images confirm the seed in the expected location and were marked for the surgeon. Follow-up survey of the patient confirms presence of the radioactive seed. Order number of I-125 seed:  [id number]. Total activity:  3.329 millicuries  Reference Date: June 27, 2016 The patient tolerated the procedure well and was released from the [facility]. She was given instructions regarding seed removal. IMPRESSION: Radioactive seed localization left breast. No apparent complications. Electronically Signed   By: Dorise Bullion III M.D   On: 07/30/2016 14:07   [place] Lt Radioactive Seed Loc  Result  Date: 07/30/2016 CLINICAL DATA:  Localize left axillary lymph node. EXAM: ULTRASOUND GUIDED RADIOACTIVE SEED LOCALIZATION OF THE LEFT AXILLA COMPARISON:  Previous exam(s). FINDINGS: Patient presents for radioactive seed localization prior to surgery. I met with the patient and we discussed the procedure of seed localization including benefits and alternatives. We discussed the high likelihood of a successful procedure. We discussed the risks of the procedure including infection, bleeding, tissue injury and further surgery. We discussed the low dose of radioactivity involved in the procedure. Informed, written consent was given. The usual time-out protocol was performed immediately prior to the procedure. Using ultrasound guidance, sterile technique, 1% lidocaine and an I-125 radioactive seed, the left axillary lymph node marked with a  HydroMARK was localized using a lateral approach. The follow-up mammogram images confirm the seed in the expected location and were marked for the surgeon. Follow-up survey of the patient confirms presence of the radioactive seed. Order number of I-125 seed:  [id number]. Total activity: 3.704 millicuries Reference Date: Jul 23, 2016 The patient tolerated the procedure well and was released from the [facility]. She was given instructions regarding seed removal. IMPRESSION: Radioactive seed localization left axilla. No apparent complications. Electronically Signed   By: Dorise Bullion III M.D   On: 07/30/2016 14:16      EKG: Independently reviewed.    Assessment/Plan Principal Problem:   Acute pulmonary embolus (HCC) CT evidence of right heart strain Admitted to step down Given recent surgery not a candidate for thrombolytics,PCCM to make further recommendations Started on heparin drip, NOAC in 24-48 hours  Empiric antibiotics due to evidence of pulmonary infarct 2-D echo to rule out right heart strain Venous doppler to r/o DVT  Breast cancer Status post neoadjuvant  chemotherapy Platelets stable and hemoglobin 10.5 Patient is in the process of beginning adjuvant radiotherapy   DVT prophylaxis:  Heparin drip     Code Status Orders Full code           consults called: PCCM  Family Communication: Admission, patients condition and plan of care including tests being ordered have been discussed with the patient  who indicates understanding and agree with the plan and Code Status  Admission status: inpatient    Disposition plan:Discussed with the patient's significant other by the bedside Further plan will depend as patient's clinical course evolves and further radiologic and laboratory data become available. Likely home when stable   At the time of admission, it appears that the appropriate admission status for this patient is INPATIENT .Thisis judged to be reasonable and necessary in order to provide the required intensity of service to ensure the patient's safetygiven thepresenting symptoms, physical exam findings, and initial radiographic and laboratory data in the context of their chronic comorbidities.   Reyne Dumas MD [facility] Pager [phone]  If 7PM-7AM, please contact night-coverage [email or web address] Password [facility]  08/15/2016, 5:39 PM

## 2016-08-15 NOTE — Progress Notes (Signed)
ANTICOAGULATION CONSULT NOTE - Initial Consult  Pharmacy Consult for Heparin Indication: pulmonary embolus  Allergies  Allergen Reactions  . Hydrocodone Other (See Comments)    Did not help with pain and kept her up all night    Patient Measurements: Height: 5\' 3"  (160 cm) Weight: 188 lb (85.3 kg) IBW/kg (Calculated) : 52.4 Heparin Dosing Weight: 72  Vital Signs: Temp: 99.7 F (37.6 C) (06/15 1501) Temp Source: Oral (06/15 1126) BP: 104/71 (06/15 1545) Pulse Rate: 76 (06/15 1545)  Labs:  Recent Labs  08/15/16 1204 08/15/16 1234  HGB 10.9* 10.5*  HCT 33.0* 31.0*  PLT 200  --   CREATININE 0.74 0.70    Estimated Creatinine Clearance: 83.3 mL/min (by C-G formula based on SCr of 0.7 mg/dL).   Medical History: Past Medical History:  Diagnosis Date  . Anxiety   . Family history of breast cancer   . Family history of colon cancer   . Malignant neoplasm of upper-outer quadrant of left female breast ([place]) 01/31/2016  . Neuromyelitis optica [facility])     Assessment: 56 year old female with breast cancer to begin heparin for pulmonary embolus  Goal of Therapy:  Heparin level 0.3-0.7 units/ml Monitor platelets by anticoagulation protocol: Yes   Plan:  Heparin 4000 units iv bolus x 1 Heparin drip at 1200 units / hr Heparin level 6 hours after heparin starts Daily heparin level, CBC  Thank you Anette Guarneri, PharmD [phone]  Tad Moore 08/15/2016,4:12 PM

## 2016-08-15 NOTE — Progress Notes (Signed)
  Plastic Surgery  Patient is 10 days post oncoplastic breast reduction and 15 d post lumpectomy for treatment breast ca, following completion neoadjuvant chemotherapy. Last seen by me 2 days ago. Seen by her breast surgeon this am and was significantly SOB with calf pain. Patient reports calf pain started 4 days ago, she attributed to muscle pull from sleeping in recliner. Received Vanc/Zosyn in ED. Imaging without evidence PNA. CT angio suggests bilateral lower lobe segmental PE.   Temp:  [99.7 F (37.6 C)-100.2 F (37.9 C)] 99.7 F (37.6 C) (06/15 1501) Pulse Rate:  [70-91] 78 (06/15 1530) Resp:  [20-34] 28 (06/15 1530) BP: (101-128)/(50-102) 105/62 (06/15 1530) SpO2:  [96 %-100 %] 100 % (06/15 1515) Weight:  [85.3 kg (188 lb)] 85.3 kg (188 lb) (06/15 1127)   PE Alert, SOB with increased work breathing Breasts: incisions intact dry without evidence cellulitis  A/P Reviewed findings with patient and family. From surgical standpoint, no restrictions on anticoagulation. Her breast exam is benign, do not need antibiotics from this perspective.   She does not need to wear any breast or post surgical binder.  Discussed with ED providers.   Irene Limbo, MD [facility] Plastic & Reconstructive Surgery [phone], pin [phone]

## 2016-08-15 NOTE — ED Provider Notes (Signed)
[facility] [facility] DEPT Provider Note   CSN: [id number] Arrival date & time: 08/15/16  1114     History   Chief Complaint Chief Complaint  Patient presents with  . Shortness of Breath    HPI Grace Moyer is a 56 y.o. female with a history of metastatic breast cancer and neuromyelitis optica who presents with shortness of breath and left sided chest/thoracic back pain.  She is 10 days post op lumpectomy Left upper quadrant with lymph node dissection 10 days ago.  She had chemotherapy prior to her surgery and is planning to start radiation soon.  She reports that she has been taking her pain medicine Intermittently for the past week. She denies fevers at home. States that her shortness of breath has been gradually worsening since last night. She has chest pain, worse in her posterior left ribs.  She says that it hurts to take a deep breath and movement and palpation exacerbate her pain. Reports her drain tubes came out two days ago.  She reports that her left calf has been swelling over the past few days and is painful.   No abdominal pain or headache.    She denies nausea or vomiting.  She has been constipated recently, saying that she has not had many bowel movements since her surgery, however she has not been eating as much.  HPI  Past Medical History:  Diagnosis Date  . Anxiety   . Family history of breast cancer   . Family history of colon cancer   . Malignant neoplasm of upper-outer quadrant of left female breast ([place]) 01/31/2016  . Neuromyelitis optica [facility])     Patient Active Problem List   Diagnosis Date Noted  . Acute pulmonary embolus ([place]) 08/15/2016  . Acute pulmonary embolism ([place]) 08/15/2016  . Anemia due to antineoplastic chemotherapy 04/23/2016  . Genetic testing 04/03/2016  . Port catheter in place 03/26/2016  . Family history of breast cancer   . Family history of colon cancer   . Neuromyelitis optica spectrum disorder ([place]) 02/06/2016  . Malignant neoplasm of  upper-outer quadrant of left female breast ([place]) 01/31/2016  . Bilateral leg numbness 08/14/2015  . Neck pain on left side 08/14/2015  . Abnormal MRI of head 08/14/2015    Past Surgical History:  Procedure Laterality Date  . ABDOMINAL HYSTERECTOMY     partial  . BREAST LUMPECTOMY WITH RADIOACTIVE SEED AND SENTINEL LYMPH NODE BIOPSY Left 07/31/2016   Procedure: LEFT BREAST RADIOACTIVE SEED GUIDED LUMPECTOMY WITH LEFT RADIOACTIVE SEED TARGETED AXILLARY SENTINEL LYMPH NODE EXCISION AND SENTINEL LYMPH NODE BIOPSY;  Surgeon: Rolm Bookbinder, MD;  Location: [place];  Service: General;  Laterality: Left;  . BREAST REDUCTION SURGERY Bilateral 08/05/2016   Procedure: LEFT ONCOPLASTIC REDUCTION; RIGHT BREAST REDUCTION;  Surgeon: Irene Limbo, MD;  Location: [place];  Service: Plastics;  Laterality: Bilateral;  . EYE SURGERY    . PORT-A-CATH REMOVAL Right 07/31/2016   Procedure: REMOVAL PORT-A-CATH;  Surgeon: Rolm Bookbinder, MD;  Location: [place];  Service: General;  Laterality: Right;  . PORTACATH PLACEMENT Right 02/11/2016   Procedure: INSERTION PORT-A-CATH WITH [place];  Surgeon: Rolm Bookbinder, MD;  Location: [place];  Service: General;  Laterality: Right;    OB History    No data available       Home Medications    Prior to Admission medications   Medication Sig Start Date End Date Taking? Authorizing Provider  Cholecalciferol (VITAMIN D-1000 MAX ST) 1000  units tablet Take 2 tablets by mouth daily.   Yes [provider]  oxyCODONE (OXY IR/ROXICODONE) 5 MG immediate release tablet Take 1-2 tablets (5-10 mg total) by mouth every 4 (four) hours as needed (for pain score of 1-4). 08/05/16  Yes Irene Limbo, MD  oxyCODONE-acetaminophen (PERCOCET) 10-325 MG tablet Take 1 tablet by mouth every 6 (six) hours as needed for pain. 07/31/16 07/31/17 Yes Rolm Bookbinder, MD  riTUXimab (RITUXAN) 100 MG/10ML  injection Inject into the vein every 4 (four) months.    [provider]    Family History Family History  Problem Relation Age of Onset  . Breast cancer Maternal Grandmother        dx in her 29s  . CAD Paternal Grandmother   . Colon cancer Paternal Uncle        dx in his 62s  . COPD Maternal Grandfather   . CAD Paternal Grandfather   . Breast cancer Cousin        dx in her early to mid 37s; maternal first cousin  . Lung cancer Paternal Uncle     Social History Social History  Substance Use Topics  . Smoking status: Former Smoker    Types: Cigarettes    Quit date: 09/03/2001  . Smokeless tobacco: Never Used  . Alcohol use Yes     Comment: social     Allergies   Hydrocodone   Review of Systems Review of Systems  Constitutional: Positive for activity change, appetite change, chills, fatigue and fever. Negative for diaphoresis.  HENT: Negative for congestion, facial swelling and sinus pain.   Eyes: Negative for photophobia and visual disturbance.  Respiratory: Positive for chest tightness and shortness of breath. Negative for cough, wheezing and stridor.   Cardiovascular: Positive for chest pain and leg swelling (calf only). Negative for palpitations.  Gastrointestinal: Positive for constipation. Negative for abdominal pain, diarrhea, nausea and vomiting.  Genitourinary: Negative for difficulty urinating, flank pain, hematuria and urgency.  Musculoskeletal: Positive for arthralgias, back pain (Left thoracic) and myalgias.  Skin: Positive for wound (from surgery).  Neurological: Positive for weakness. Negative for dizziness, light-headedness, numbness and headaches.     Physical Exam Updated Vital Signs BP 100/64   Pulse 72   Temp 99.7 F (37.6 C)   Resp (!) 29   Ht 5\' 3"  (1.6 m)   Wt 86.7 kg (191 lb 2.2 oz)   SpO2 100%   BMI 33.86 kg/m   Physical Exam  Constitutional: She appears well-developed and well-nourished. She appears distressed.  HENT:    Head: Normocephalic and atraumatic.  Right Ear: External ear normal.  Left Ear: External ear normal.  Mouth/Throat: Oropharynx is clear and moist.  Eyes: Pupils are equal, round, and reactive to light. Right eye exhibits no discharge. Left eye exhibits no discharge. No scleral icterus.  Neck: Trachea normal and normal range of motion. Neck supple. No JVD present. No neck rigidity. No tracheal deviation present.  Cardiovascular: Normal rate, regular rhythm, normal heart sounds and intact distal pulses.   No murmur heard. Left calf is swollen when compared to right.   Pulmonary/Chest: No stridor. Tachypnea noted. She is in respiratory distress. She has no wheezes. She has no rhonchi. She has no rales. She exhibits tenderness.  Patient with frequent shallow respirations.  Breath sounds head equally bilaterally.   Abdominal: Soft. Bowel sounds are normal. She exhibits no distension. There is no tenderness. There is no guarding.  Musculoskeletal: Normal range of motion. She exhibits tenderness.  Neurological: She is alert. No cranial nerve deficit or sensory deficit. She exhibits normal muscle tone.  Skin: Skin is warm and dry. She is not diaphoretic.  Psychiatric: She has a normal mood and affect. Her behavior is normal.  Nursing note and vitals reviewed.    ED Treatments / Results  Labs (all labs ordered are listed, but only abnormal results are displayed) Labs Reviewed  COMPREHENSIVE METABOLIC PANEL - Abnormal; Notable for the following:       Result Value   Sodium 134 (*)    CO2 21 (*)    Glucose, Bld 114 (*)    Alkaline Phosphatase 182 (*)    All other components within normal limits  CBC WITH DIFFERENTIAL/PLATELET - Abnormal; Notable for the following:    RBC 3.53 (*)    Hemoglobin 10.9 (*)    HCT 33.0 (*)    Neutro Abs 7.8 (*)    Lymphs Abs 0.6 (*)    All other components within normal limits  I-STAT CHEM 8, ED - Abnormal; Notable for the following:    Glucose, Bld 111 (*)     Hemoglobin 10.5 (*)    HCT 31.0 (*)    All other components within normal limits  CULTURE, BLOOD (ROUTINE X 2)  CULTURE, BLOOD (ROUTINE X 2)  MRSA PCR SCREENING  BRAIN NATRIURETIC PEPTIDE  URINALYSIS, ROUTINE W REFLEX MICROSCOPIC  HEPARIN LEVEL (UNFRACTIONATED)  HIV ANTIBODY (ROUTINE TESTING)  MAGNESIUM  TROPONIN I  TROPONIN I  TROPONIN I  HEMOGLOBIN A1C  HEPARIN LEVEL (UNFRACTIONATED)  CBC  COMPREHENSIVE METABOLIC PANEL  I-STAT TROPOININ, ED  I-STAT CG4 LACTIC ACID, ED    EKG  EKG Interpretation None       Radiology Ct Angio Chest Pe W/cm &/or Wo Cm  Result Date: 08/15/2016 CLINICAL DATA:  56 year old female with shortness of breath, chest pain radiating to the right back. Left Breast cancer diagnosed in late 2017. EXAM: CT ANGIOGRAPHY CHEST WITH CONTRAST TECHNIQUE: Multidetector CT imaging of the chest was performed using the standard protocol during bolus administration of intravenous contrast. Multiplanar CT image reconstructions and MIPs were obtained to evaluate the vascular anatomy. CONTRAST:  100 mL Isovue 370 COMPARISON:  Portable chest radiograph 1227 hours today. CT chest abdomen and pelvis 02/12/2016. FINDINGS: Cardiovascular: Good contrast bolus timing in the pulmonary arterial tree. Mild to moderate motion artifact. Motion artifact in the right lower lobe where enhancing atelectasis is present in the posterior basal segment of the right lower lobe, but there is a suspicious appearance of the medial basal segment pulmonary artery branch as seen on series 6, image 135. This segment extends to an area of airspace disease which is not enhancing. There is a similar finding in the left lower lobe seen on series 6, image 138, including nonenhancing distal airspace disease. Elsewhere no convincing convincing filling defect in the pulmonary arteries. Negative visualized aorta. Cardiac size is at the upper limits of normal. RV / LV ratio = 0.9-1 No pericardial effusion.  Mediastinum/Nodes: No mediastinal lymphadenopathy. Lungs/Pleura: Low lung volumes. Major airways are patent. Enhancing atelectasis in the posterior basal segment of the right lower lobe, but wedge-shaped nonenhancing airspace disease in both the superior segment of the left lower lobe and medial basal segment of the right lower lobe. Other mild atelectasis elsewhere. Trace right pleural effusion. Upper Abdomen: Negative visualized liver, gallbladder, spleen, pancreas, adrenal glands, kidneys, and bowel in the upper abdomen. Other findings: Postoperative changes to the left breast, including several small areas of subcutaneous  gas. Scattered surgical clips. Musculoskeletal: No acute or suspicious osseous lesion identified. Review of the MIP images confirms the above findings. IMPRESSION: 1. Appearance highly suspicious for bilateral lower lobe segmental pulmonary emboli and developing small pulmonary infarcts. Trace right pleural effusion. 2. Possible right heart strain with an abnormal RV/LV ratio of 0.9-1.0. Consider submassive (intermediate risk) PE, and activation of Code PE by paging [phone]. 3. This was discussed by telephone with Dr. Fredia Sorrow on 08/15/2016 at 15:04 . Electronically Signed   By: Genevie Ann M.D.   On: 08/15/2016 15:05   Dg Chest Portable 1 View  Result Date: 08/15/2016 CLINICAL DATA:  Shortness of Breath EXAM: PORTABLE CHEST 1 VIEW COMPARISON:  02/11/2016 FINDINGS: Cardiac shadow is within normal limits. The lungs are well aerated bilaterally. No focal infiltrate or sizable effusion is seen. Previously noted right chest wall port has been removed. Postsurgical changes in the left breast are noted. IMPRESSION: No acute abnormality noted. Electronically Signed   By: Inez Catalina M.D.   On: 08/15/2016 13:02    Procedures Procedures  CRITICAL CARE Performed by: Wyn Quaker Total critical care time: 38 minutes Critical care time was exclusive of separately billable  procedures and treating other patients. Critical care was necessary to treat or prevent imminent or life-threatening deterioration. Critical care was time spent personally by me on the following activities: development of treatment plan with patient and/or surrogate as well as nursing, discussions with consultants, evaluation of patient's response to treatment, examination of patient, obtaining history from patient or surrogate, ordering and performing treatments and interventions, ordering and review of laboratory studies, ordering and review of radiographic studies, pulse oximetry and re-evaluation of patient's condition.  Bilateral PE with thrombolysis consideration/critical care consult, right heart strain  Sepsis with IV antibiotics    Medications Ordered in ED Medications  heparin ADULT infusion 100 units/mL (25000 units/282mL sodium chloride 0.45%) (1,200 Units/hr Intravenous Rate/Dose Verify 08/15/16 1700)  acetaminophen (TYLENOL) tablet 650 mg (not administered)    Or  acetaminophen (TYLENOL) suppository 650 mg (not administered)  oxyCODONE (Oxy IR/ROXICODONE) immediate release tablet 5 mg (not administered)  polyethylene glycol (MIRALAX / GLYCOLAX) packet 17 g (not administered)  ondansetron (ZOFRAN) tablet 4 mg (not administered)    Or  ondansetron (ZOFRAN) injection 4 mg (not administered)  levalbuterol (XOPENEX) nebulizer solution 0.63 mg (not administered)  fentaNYL (SUBLIMAZE) injection 50 mcg (50 mcg Intravenous Given 08/15/16 1207)  piperacillin-tazobactam (ZOSYN) IVPB 3.375 g (0 g Intravenous Stopped 08/15/16 1319)  vancomycin (VANCOCIN) 1,500 mg in sodium chloride 0.9 % 500 mL IVPB (0 mg Intravenous Stopped 08/15/16 1555)  fentaNYL (SUBLIMAZE) injection 50 mcg (50 mcg Intravenous Given 08/15/16 1246)  iopamidol (ISOVUE-370) 76 % injection (100 mLs  Contrast Given 08/15/16 1434)  fentaNYL (SUBLIMAZE) injection 50 mcg (50 mcg Intravenous Given 08/15/16 1343)  sodium chloride 0.9 %  bolus 500 mL (500 mLs Intravenous New Bag/Given 08/15/16 1627)  heparin bolus via infusion 4,000 Units (4,000 Units Intravenous Bolus from Bag 08/15/16 1651)     Initial Impression / Assessment and Plan / ED Course  I have reviewed the triage vital signs and the nursing notes.  Pertinent labs & imaging results that were available during my care of the patient were reviewed by me and considered in my medical decision making (see chart for details).  Clinical Course as of Aug 16 1754  Fri Aug 15, 2016  1232 Patient re-assessed.  Feeling slightly better after fentanyl.  Able to take deeper breaths but still tachypneic.  CXR portable obtained, bed side quick read both lungs appear inflated with no obvious mass or collection.   [EH]  1303 Placed patient on 1 liter [place] oxygen to see if it would provide relief.  She reports she feels "drunk" after the second dose of fentanyl but that she isn't having to work as hard to breathe any more.   [EH]  1400 Informed by pt and her family that patient had been wearing a post op support around her chest until last night.  [EH]  1455 Patient in pain upon returning from CT.  Same location and character of pain.  Has order for PRN fentanyl in.   [EH]  1504 Patient re-checked, more comfortable after fentanyl  [EH]  1528 Per radiologist suggestion had secretary page code PE  [EH]  [phone] Spoke with hospitalist, will start on heparin  [EH]  1621 Spoke with critical care, they reported will see her in consult, suggested step down unit and 2D echo.   [EH]    Clinical Course User Index [EH] Lorin Glass, PA-C   Noralee Space presents with tachycardia, shortness of breath and pain.  She is status post recent lumpectomy with lymph node dissection to her left breast.  Initially patient was felt to be septic based on tachypnea and elevated temperature. Broad-spectrum antibiotics were started based on unknown source. Additional workup showed normal lactic and normal  white count.  CTA obtained due to high suspicion for PE (tachypnea, recent surgery, unilateral leg swelling and pain) with bilateral PE and possible cor pulmonale and small lung infarcts.  Code PE called and consult to pulm/cc was placed for consideration of thrombolytics.  Hospitalist was consulted for admission and patient started on heparin.  Patients tachypnea improved when her pain was treated and she was able to take deeper breaths.  Because of soft BP her pain was treated with fentanyl instead of morphine.    The patient appears reasonably stabilized for admission considering the current resources, flow, and capabilities available in the ED at this time, and I doubt any other [facility] requiring further screening and/or treatment in the ED prior to admission.    Final Clinical Impressions(s) / ED Diagnoses   Final diagnoses:  Other acute pulmonary embolism with acute cor pulmonale (HCC)  Malignant neoplasm of upper-outer quadrant of left breast in female, estrogen receptor negative [facility])    New Prescriptions Current Discharge Medication List           Ollen Gross 08/17/16 1231    Fredia Sorrow, MD 09/08/16 [phone]

## 2016-08-15 NOTE — Consult Note (Signed)
Name: Grace Moyer MRN: [id number] DOB: [birth year]    ADMISSION DATE:  08/15/2016 CONSULTATION DATE:  6/15  REFERRING MD :  Dr. Allyson Sabal  CHIEF COMPLAINT:  SOB  HISTORY OF PRESENT ILLNESS:  56 year old female with PMH as below, which is significant for breast Ca s/p chemotherapy. She underwent L breast lumpectomy 5/31 of this year, then breast reconstruction surgery 6/5. Surgeries were without complication and she was discharged to home same day. Since that time she has been rather sedentary as she recovered from surgery. 6/11 she noted the development or L calf pain, thinking this was a muscle strain. 6/14 late PM she developed acute onset SOB and Right posterior chest pain causing her to present to [facility] emergency department on 6/15. She underwent CT angiogram of the chest, which was concerning for bilateral lower lobe segmental pulmonary emboli with possible R heart strain. She was started on heparin infusion and admitted to the hospitalists. PCCM asked to see for further evaluation.  SIGNIFICANT EVENTS  5/31 - L lumpectomy 6/5 - breast reconstruction surgery 6/13 - chest tube removed 6/15 admit for PE.  STUDIES:  CTA chest 6/15 > Appearance highly suspicious for bilateral lower lobe segmental pulmonary emboli and developing small pulmonary infarcts. Trace right pleural effusion. Possible right heart strain with an abnormal RV/LV ratio of 0.9-1.0. Consider submassive (intermediate risk) PE.  PAST MEDICAL HISTORY :   has a past medical history of Anxiety; Family history of breast cancer; Family history of colon cancer; Malignant neoplasm of upper-outer quadrant of left female breast ([place]) (01/31/2016); and Neuromyelitis optica ([place]).  has a past surgical history that includes Abdominal hysterectomy; Eye surgery; Portacath placement (Right, 02/11/2016); Breast lumpectomy with radioactive seed and sentinel lymph node biopsy (Left, 07/31/2016); Port-a-cath removal (Right, 07/31/2016); and  Breast reduction surgery (Bilateral, 08/05/2016). Prior to Admission medications   Medication Sig Start Date End Date Taking? Authorizing Provider  Cholecalciferol (VITAMIN D-1000 MAX ST) 1000 units tablet Take 2 tablets by mouth daily.   Yes [provider]  oxyCODONE (OXY IR/ROXICODONE) 5 MG immediate release tablet Take 1-2 tablets (5-10 mg total) by mouth every 4 (four) hours as needed (for pain score of 1-4). 08/05/16  Yes Irene Limbo, MD  oxyCODONE-acetaminophen (PERCOCET) 10-325 MG tablet Take 1 tablet by mouth every 6 (six) hours as needed for pain. 07/31/16 07/31/17 Yes Rolm Bookbinder, MD  riTUXimab (RITUXAN) 100 MG/10ML injection Inject into the vein every 4 (four) months.    [provider]   Allergies  Allergen Reactions  . Hydrocodone Other (See Comments)    Did not help with pain and kept her up all night    FAMILY HISTORY:  family history includes Breast cancer in her cousin and maternal grandmother; CAD in her paternal grandfather and paternal grandmother; COPD in her maternal grandfather; Colon cancer in her paternal uncle; Lung cancer in her paternal uncle. SOCIAL HISTORY:  reports that she quit smoking about 14 years ago. Her smoking use included Cigarettes. She has never used smokeless tobacco. She reports that she drinks alcohol. She reports that she does not use drugs.  REVIEW OF SYSTEMS:   Bolds are positive  Constitutional: weight loss, gain, night sweats, Fevers, chills, fatigue .  HEENT: headaches, Sore throat, sneezing, nasal congestion, post nasal drip, Difficulty swallowing, Tooth/dental problems, visual complaints visual changes, ear ache CV:  chest pain, radiates:,Orthopnea, PND, swelling in lower extremities (left mainly), dizziness, palpitations, syncope.  GI  heartburn, indigestion, abdominal pain, nausea, vomiting, diarrhea,  change in bowel habits, loss of appetite, bloody stools.  Resp: cough, productive: , hemoptysis, dyspnea (due to  painful inspiration), chest pain, pleuritic (r sided subscapular pain).  Skin: rash or itching or icterus GU: dysuria, change in color of urine, urgency or frequency. flank pain, hematuria  MS: joint pain or swelling. decreased range of motion  Psych: change in mood or affect. depression or anxiety.  Neuro: difficulty with speech, weakness, numbness, ataxia    SUBJECTIVE:   VITAL SIGNS: Temp:  [99.7 F (37.6 C)-100.2 F (37.9 C)] 99.7 F (37.6 C) (06/15 1501) Pulse Rate:  [70-91] 72 (06/15 1626) Resp:  [20-34] 29 (06/15 1626) BP: (100-128)/(50-102) 100/64 (06/15 1626) SpO2:  [96 %-100 %] 100 % (06/15 1626) Weight:  [85.3 kg (188 lb)] 85.3 kg (188 lb) (06/15 1127)  PHYSICAL EXAMINATION: General:  Adult female of normal body habitus in mild distress Neuro:  Alert, oriented, non-focal HEENT:  [place]/AT, PERRL, no JVD Cardiovascular:  RRR, no MRG Lungs:  Rapid shallow respirations, limited by pleuritic pain. Clear to auscultation Abdomen:  Soft, non-tender, non-distended Musculoskeletal:  Warmth and edema to bilateral lower legs, L > R Skin:  Pale, grossly intact   Recent Labs Lab 08/15/16 1204 08/15/16 1234  NA 134* 137  K 3.9 3.9  CL 104 102  CO2 21*  --   BUN 10 11  CREATININE 0.74 0.70  GLUCOSE 114* 111*    Recent Labs Lab 08/15/16 1204 08/15/16 1234  HGB 10.9* 10.5*  HCT 33.0* 31.0*  WBC 9.1  --   PLT 200  --    Ct Angio Chest Pe W/cm &/or Wo Cm  Result Date: 08/15/2016 CLINICAL DATA:  56 year old female with shortness of breath, chest pain radiating to the right back. Left Breast cancer diagnosed in late 2017. EXAM: CT ANGIOGRAPHY CHEST WITH CONTRAST TECHNIQUE: Multidetector CT imaging of the chest was performed using the standard protocol during bolus administration of intravenous contrast. Multiplanar CT image reconstructions and MIPs were obtained to evaluate the vascular anatomy. CONTRAST:  100 mL Isovue 370 COMPARISON:  Portable chest radiograph 1227 hours  today. CT chest abdomen and pelvis 02/12/2016. FINDINGS: Cardiovascular: Good contrast bolus timing in the pulmonary arterial tree. Mild to moderate motion artifact. Motion artifact in the right lower lobe where enhancing atelectasis is present in the posterior basal segment of the right lower lobe, but there is a suspicious appearance of the medial basal segment pulmonary artery branch as seen on series 6, image 135. This segment extends to an area of airspace disease which is not enhancing. There is a similar finding in the left lower lobe seen on series 6, image 138, including nonenhancing distal airspace disease. Elsewhere no convincing convincing filling defect in the pulmonary arteries. Negative visualized aorta. Cardiac size is at the upper limits of normal. RV / LV ratio = 0.9-1 No pericardial effusion. Mediastinum/Nodes: No mediastinal lymphadenopathy. Lungs/Pleura: Low lung volumes. Major airways are patent. Enhancing atelectasis in the posterior basal segment of the right lower lobe, but wedge-shaped nonenhancing airspace disease in both the superior segment of the left lower lobe and medial basal segment of the right lower lobe. Other mild atelectasis elsewhere. Trace right pleural effusion. Upper Abdomen: Negative visualized liver, gallbladder, spleen, pancreas, adrenal glands, kidneys, and bowel in the upper abdomen. Other findings: Postoperative changes to the left breast, including several small areas of subcutaneous gas. Scattered surgical clips. Musculoskeletal: No acute or suspicious osseous lesion identified. Review of the MIP images confirms the above findings. IMPRESSION:  1. Appearance highly suspicious for bilateral lower lobe segmental pulmonary emboli and developing small pulmonary infarcts. Trace right pleural effusion. 2. Possible right heart strain with an abnormal RV/LV ratio of 0.9-1.0. Consider submassive (intermediate risk) PE, and activation of Code PE by paging [phone]. 3. This  was discussed by telephone with Dr. Fredia Sorrow on 08/15/2016 at 15:04 . Electronically Signed   By: Genevie Ann M.D.   On: 08/15/2016 15:05   Dg Chest Portable 1 View  Result Date: 08/15/2016 CLINICAL DATA:  Shortness of Breath EXAM: PORTABLE CHEST 1 VIEW COMPARISON:  02/11/2016 FINDINGS: Cardiac shadow is within normal limits. The lungs are well aerated bilaterally. No focal infiltrate or sizable effusion is seen. Previously noted right chest wall port has been removed. Postsurgical changes in the left breast are noted. IMPRESSION: No acute abnormality noted. Electronically Signed   By: Inez Catalina M.D.   On: 08/15/2016 13:02    ASSESSMENT / PLAN:  Bilateral pulmonary emboli: relatively small, segmental, RV:LV ratio estimated at 0.9 to 1. Able to sat in the high 90s on room air. Likely provoked in the setting of 2 recent surgeries and sedentary while recovering.  - Supplemental O2 to keep sats > 92% - Continued heparin infusion per pharmacy then convet to warfarin vs DOAC 6/17 - no role thrombolytics of any kind at this point - Echocardiogram and lower extremity doppler - Recommend 6 months anticoagulation duration.  - Pain management.  Breast Ca - Per primary  Pulmonary and [place] Pager: [phone]  08/15/2016, 5:13 PM

## 2016-08-15 NOTE — Progress Notes (Signed)
Dr. Loleta Books notified of pts increased pain, orders received

## 2016-08-15 NOTE — Progress Notes (Signed)
eLink Physician-Brief Progress Note Patient Name: Grace Moyer DOB: 02-07-1961 MRN: [id number]   Date of Service  08/15/2016  HPI/Events of Note  Patient c/o back pain - Patient states that the oral analgesic takes awhile to start to have an effect.   eICU Interventions  Will order: 1. Fentanyl 50 mcg IV X 1 now.  Further pain medication orders per primary team.      Intervention Category Intermediate Interventions: Pain - evaluation and management  Lysle Dingwall 08/15/2016, 6:53 PM

## 2016-08-15 NOTE — ED Notes (Signed)
Pt in CT.

## 2016-08-15 NOTE — Progress Notes (Signed)
Pharmacy Antibiotic Note  Grace Moyer is a 56 y.o. female admitted on 08/15/2016 with sepsis.  Pharmacy has been consulted for vancomycin and zosyn dosing.  Patient received vancomycin 1500mg  and zosyn 3.375g IV once in the ED.  Plan: Vancomycin 1g IV every 8 hours.  Goal trough 15-20 mcg/mL. Zosyn 3.375g IV q8h (4 hour infusion).  Monitor culture data, renal function and clinical course VT at SS prn  Height: 5\' 3"  (160 cm) Weight: 188 lb (85.3 kg) IBW/kg (Calculated) : 52.4  Temp (24hrs), Avg:99.1 F (37.3 C), Min:97.9 F (36.6 C), Max:100.2 F (37.9 C)  No results for input(s): WBC, CREATININE, LATICACIDVEN, VANCOTROUGH, VANCOPEAK, VANCORANDOM, GENTTROUGH, GENTPEAK, GENTRANDOM, TOBRATROUGH, TOBRAPEAK, TOBRARND, AMIKACINPEAK, AMIKACINTROU, AMIKACIN in the last 168 hours.  CrCl cannot be calculated (Patient's most recent lab result is older than the maximum 21 days allowed.).    Allergies  Allergen Reactions  . Hydrocodone Other (See Comments)    Did not help with pain and kept her up all night     Andrey Cota. Diona Foley, PharmD, BCPS Clinical Pharmacist [phone] 08/15/2016 12:16 PM

## 2016-08-16 ENCOUNTER — Inpatient Hospital Stay (HOSPITAL_COMMUNITY): Payer: 59

## 2016-08-16 DIAGNOSIS — I824Z2 Acute embolism and thrombosis of unspecified deep veins of left distal lower extremity: Secondary | ICD-10-CM

## 2016-08-16 DIAGNOSIS — Z86711 Personal history of pulmonary embolism: Secondary | ICD-10-CM

## 2016-08-16 LAB — COMPREHENSIVE METABOLIC PANEL
ALT: 32 U/L (ref 14–54)
AST: 28 U/L (ref 15–41)
Albumin: 3.3 g/dL — ABNORMAL LOW (ref 3.5–5.0)
Alkaline Phosphatase: 159 U/L — ABNORMAL HIGH (ref 38–126)
Anion gap: 9 (ref 5–15)
BUN: 9 mg/dL (ref 6–20)
CO2: 21 mmol/L — ABNORMAL LOW (ref 22–32)
Calcium: 8.9 mg/dL (ref 8.9–10.3)
Chloride: 102 mmol/L (ref 101–111)
Creatinine, Ser: 0.79 mg/dL (ref 0.44–1.00)
GFR calc Af Amer: >60 mL/min (ref >60)
GFR calc non Af Amer: >60 mL/min (ref >60)
Glucose, Bld: 119 mg/dL — ABNORMAL HIGH (ref 65–99)
Potassium: 3.6 mmol/L (ref 3.5–5.1)
Sodium: 132 mmol/L — ABNORMAL LOW (ref 135–145)
Total Bilirubin: 1.3 mg/dL — ABNORMAL HIGH (ref 0.3–1.2)
Total Protein: 6.1 g/dL — ABNORMAL LOW (ref 6.5–8.1)

## 2016-08-16 LAB — CBC
HCT: 30.9 % — ABNORMAL LOW (ref 36.0–46.0)
Hemoglobin: 10 g/dL — ABNORMAL LOW (ref 12.0–15.0)
MCH: 30.3 pg (ref 26.0–34.0)
MCHC: 32.4 g/dL (ref 30.0–36.0)
MCV: 93.6 fL (ref 78.0–100.0)
Platelets: 199 10*3/uL (ref 150–400)
RBC: 3.3 MIL/uL — ABNORMAL LOW (ref 3.87–5.11)
RDW: 12.6 % (ref 11.5–15.5)
WBC: 6.8 10*3/uL (ref 4.0–10.5)

## 2016-08-16 LAB — HEMOGLOBIN A1C
Hgb A1c MFr Bld: 4.6 % — ABNORMAL LOW (ref 4.8–5.6)
Mean Plasma Glucose: 85 mg/dL

## 2016-08-16 LAB — TROPONIN I: Troponin I: <0.03 ng/mL (ref <0.03)

## 2016-08-16 LAB — HEPARIN LEVEL (UNFRACTIONATED)
Heparin Unfractionated: 0.18 IU/mL — ABNORMAL LOW (ref 0.30–0.70)
Heparin Unfractionated: 0.83 IU/mL — ABNORMAL HIGH (ref 0.30–0.70)
Heparin Unfractionated: 0.48 IU/mL (ref 0.30–0.70)
Heparin Unfractionated: 0.18 IU/mL — ABNORMAL LOW (ref 0.30–0.70)

## 2016-08-16 LAB — HIV ANTIBODY (ROUTINE TESTING W REFLEX): HIV Screen 4th Generation wRfx: NONREACTIVE

## 2016-08-16 MED ORDER — HYDROMORPHONE HCL 1 MG/ML IJ SOLN
2.0000 mg | INTRAMUSCULAR | Status: DC | PRN
  Administered 2016-08-16 – 2016-08-17 (×10): 2 mg via INTRAVENOUS
  Filled 2016-08-16 (×10): qty 2

## 2016-08-16 MED ORDER — METHOCARBAMOL 1000 MG/10ML IJ SOLN
500.0000 mg | Freq: Once | INTRAVENOUS | Status: AC
  Administered 2016-08-16: 500 mg via INTRAVENOUS
  Filled 2016-08-16: qty 5

## 2016-08-16 MED ORDER — OXYCODONE HCL ER 20 MG PO T12A
20.0000 mg | EXTENDED_RELEASE_TABLET | Freq: Two times a day (BID) | ORAL | Status: DC
  Administered 2016-08-16 – 2016-08-18 (×4): 20 mg via ORAL
  Filled 2016-08-16 (×4): qty 1

## 2016-08-16 MED ORDER — OXYCODONE HCL ER 15 MG PO T12A
15.0000 mg | EXTENDED_RELEASE_TABLET | Freq: Two times a day (BID) | ORAL | Status: DC
  Administered 2016-08-16: 15 mg via ORAL
  Filled 2016-08-16: qty 1

## 2016-08-16 MED ORDER — OXYCODONE HCL 5 MG PO TABS
15.0000 mg | ORAL_TABLET | ORAL | Status: DC | PRN
  Administered 2016-08-16 – 2016-08-18 (×7): 15 mg via ORAL
  Filled 2016-08-16 (×7): qty 3

## 2016-08-16 MED ORDER — HEPARIN BOLUS VIA INFUSION
3000.0000 [IU] | Freq: Once | INTRAVENOUS | Status: AC
  Administered 2016-08-16: 3000 [IU] via INTRAVENOUS
  Filled 2016-08-16: qty 3000

## 2016-08-16 MED ORDER — BOOST PLUS PO LIQD
237.0000 mL | Freq: Two times a day (BID) | ORAL | Status: DC
  Administered 2016-08-16 – 2016-08-18 (×4): 237 mL via ORAL
  Filled 2016-08-16: qty 237

## 2016-08-16 NOTE — Progress Notes (Addendum)
*  PRELIMINARY RESULTS* Vascular Ultrasound Lower extremity venous duplex has been completed.  Preliminary findings: DVT noted in the Left distal femoral vein, popliteal vein, posterior tibial veins, and peroneal veins.  No DVT RLE.     Called results to Tammy, RN  Landry Mellow, RDMS, RVT  08/16/2016, 10:08 AM

## 2016-08-16 NOTE — Progress Notes (Signed)
Notified Dr. Allyson Sabal of venous duplex results - noted DVT positive in LLE.

## 2016-08-16 NOTE — Progress Notes (Signed)
Grace Moyer   DOB:06-17-1960   HB#:[id number]   YBO#:[id number]  Oncology follow up   Subjective: Patient was admitted to hospital for bilateral PE and DVT. She is a well known to me, has been under my care for her breast cancer. She recently underwent left breast lumpectomy and sentinel lymph node biopsy on 07/31/2016, and a bilateral breast reduction on 08/01/2016. She has been sedentary since the 2 surgeries. She complains of right-sided chest pain, and slightly worsening of peripheral neuropathy on left foot.   Objective:  Vitals:   08/16/16 0900 08/16/16 1245  BP: 102/64 114/79  Pulse: 74 81  Resp: (!) 27 (!) 28  Temp: 98.5 F (36.9 C) 98 F (36.7 C)    Body mass index is 33.86 kg/m.  Intake/Output Summary (Last 24 hours) at 08/16/16 1612 Last data filed at 08/16/16 0800  Gross per 24 hour  Intake               36 ml  Output              950 ml  Net             -914 ml     Sclerae unicteric  Oropharynx clear  No peripheral adenopathy  Lungs clear -- no rales or rhonchi  Heart regular rate and rhythm  Abdomen benign  MSK no focal spinal tenderness, no peripheral edema  Neuro nonfocal    CBG (last 3)  No results for input(s): GLUCAP in the last 72 hours.   Labs:  Lab Results  Component Value Date   WBC 6.8 08/16/2016   HGB 10.0 (L) 08/16/2016   HCT 30.9 (L) 08/16/2016   MCV 93.6 08/16/2016   PLT 199 08/16/2016   NEUTROABS 7.8 (H) 08/15/2016   CMP Latest Ref Rng & Units 08/16/2016 08/15/2016 08/15/2016  Glucose 65 - 99 mg/dL 119(H) 111(H) 114(H)  BUN 6 - 20 mg/dL 9 11 10   Creatinine 0.44 - 1.00 mg/dL 0.79 0.70 0.74  Sodium 135 - 145 mmol/L 132(L) 137 134(L)  Potassium 3.5 - 5.1 mmol/L 3.6 3.9 3.9  Chloride 101 - 111 mmol/L 102 102 104  CO2 22 - 32 mmol/L 21(L) - 21(L)  Calcium 8.9 - 10.3 mg/dL 8.9 - 9.2  Total Protein 6.5 - 8.1 g/dL 6.1(L) - 6.6  Total Bilirubin 0.3 - 1.2 mg/dL 1.3(H) - 1.1  Alkaline Phos 38 - 126 U/L 159(H) - 182(H)  AST 15 - 41 U/L 28  - 38  ALT 14 - 54 U/L 32 - 41    Urine Studies No results for input(s): UHGB, CRYS in the last 72 hours.  Invalid input(s): UACOL, UAPR, USPG, UPH, UTP, UGL, UKET, UBIL, UNIT, UROB, ULEU, UEPI, UWBC, URBC, UBAC, CAST, [place], [place]  Basic Metabolic Panel:  Recent Labs Lab 08/15/16 1204 08/15/16 1234 08/15/16 1857 08/16/16 0219  NA 134* 137  --  132*  K 3.9 3.9  --  3.6  CL 104 102  --  102  CO2 21*  --   --  21*  GLUCOSE 114* 111*  --  119*  BUN 10 11  --  9  CREATININE 0.74 0.70  --  0.79  CALCIUM 9.2  --   --  8.9  MG  --   --  1.8  --    GFR Estimated Creatinine Clearance: 83.9 mL/min (by C-G formula based on SCr of 0.79 mg/dL). Liver Function Tests:  Recent Labs Lab 08/15/16 1204 08/16/16 0219  AST  38 28  ALT 41 32  ALKPHOS 182* 159*  BILITOT 1.1 1.3*  PROT 6.6 6.1*  ALBUMIN 3.8 3.3*   No results for input(s): LIPASE, AMYLASE in the last 168 hours. No results for input(s): AMMONIA in the last 168 hours. Coagulation profile No results for input(s): INR, PROTIME in the last 168 hours.  CBC:  Recent Labs Lab 08/15/16 1204 08/15/16 1234 08/16/16 0219  WBC 9.1  --  6.8  NEUTROABS 7.8*  --   --   HGB 10.9* 10.5* 10.0*  HCT 33.0* 31.0* 30.9*  MCV 93.5  --  93.6  PLT 200  --  199   Cardiac Enzymes:  Recent Labs Lab 08/15/16 1857 08/16/16 0219  TROPONINI <0.03 <0.03   BNP: Invalid input(s): POCBNP CBG: No results for input(s): GLUCAP in the last 168 hours. D-Dimer No results for input(s): DDIMER in the last 72 hours. Hgb A1c  Recent Labs  08/15/16 1857  HGBA1C 4.6*   Lipid Profile No results for input(s): CHOL, HDL, LDLCALC, TRIG, CHOLHDL, LDLDIRECT in the last 72 hours. Thyroid function studies No results for input(s): TSH, T4TOTAL, T3FREE, THYROIDAB in the last 72 hours.  Invalid input(s): FREET3 Anemia work up No results for input(s): VITAMINB12, FOLATE, FERRITIN, TIBC, IRON, RETICCTPCT in the last 72 hours. Microbiology Recent  Results (from the past 240 hour(s))  Blood culture (routine x 2)     Status: None (Preliminary result)   Collection Time: 08/15/16 12:00 PM  Result Value Ref Range Status   Specimen Description BLOOD RIGHT ANTECUBITAL  Final   Special Requests   Final    BOTTLES DRAWN AEROBIC AND ANAEROBIC Blood Culture adequate volume   Culture NO GROWTH < 24 HOURS  Final   Report Status PENDING  Incomplete  Blood culture (routine x 2)     Status: None (Preliminary result)   Collection Time: 08/15/16 12:23 PM  Result Value Ref Range Status   Specimen Description BLOOD RIGHT FOREARM  Final   Special Requests   Final    BOTTLES DRAWN AEROBIC AND ANAEROBIC Blood Culture adequate volume   Culture NO GROWTH < 24 HOURS  Final   Report Status PENDING  Incomplete  MRSA PCR Screening     Status: None   Collection Time: 08/15/16  5:41 PM  Result Value Ref Range Status   MRSA by PCR NEGATIVE NEGATIVE Final    Comment:        The GeneXpert MRSA Assay (FDA approved for NASAL specimens only), is one component of a comprehensive MRSA colonization surveillance program. It is not intended to diagnose MRSA infection nor to guide or monitor treatment for MRSA infections.       Studies:  Ct Angio Chest Pe W/cm &/or Wo Cm  Result Date: 08/15/2016 CLINICAL DATA:  56 year old female with shortness of breath, chest pain radiating to the right back. Left Breast cancer diagnosed in late 2017. EXAM: CT ANGIOGRAPHY CHEST WITH CONTRAST TECHNIQUE: Multidetector CT imaging of the chest was performed using the standard protocol during bolus administration of intravenous contrast. Multiplanar CT image reconstructions and MIPs were obtained to evaluate the vascular anatomy. CONTRAST:  100 mL Isovue 370 COMPARISON:  Portable chest radiograph 1227 hours today. CT chest abdomen and pelvis 02/12/2016. FINDINGS: Cardiovascular: Good contrast bolus timing in the pulmonary arterial tree. Mild to moderate motion artifact. Motion  artifact in the right lower lobe where enhancing atelectasis is present in the posterior basal segment of the right lower lobe, but there is a suspicious  appearance of the medial basal segment pulmonary artery branch as seen on series 6, image 135. This segment extends to an area of airspace disease which is not enhancing. There is a similar finding in the left lower lobe seen on series 6, image 138, including nonenhancing distal airspace disease. Elsewhere no convincing convincing filling defect in the pulmonary arteries. Negative visualized aorta. Cardiac size is at the upper limits of normal. RV / LV ratio = 0.9-1 No pericardial effusion. Mediastinum/Nodes: No mediastinal lymphadenopathy. Lungs/Pleura: Low lung volumes. Major airways are patent. Enhancing atelectasis in the posterior basal segment of the right lower lobe, but wedge-shaped nonenhancing airspace disease in both the superior segment of the left lower lobe and medial basal segment of the right lower lobe. Other mild atelectasis elsewhere. Trace right pleural effusion. Upper Abdomen: Negative visualized liver, gallbladder, spleen, pancreas, adrenal glands, kidneys, and bowel in the upper abdomen. Other findings: Postoperative changes to the left breast, including several small areas of subcutaneous gas. Scattered surgical clips. Musculoskeletal: No acute or suspicious osseous lesion identified. Review of the MIP images confirms the above findings. IMPRESSION: 1. Appearance highly suspicious for bilateral lower lobe segmental pulmonary emboli and developing small pulmonary infarcts. Trace right pleural effusion. 2. Possible right heart strain with an abnormal RV/LV ratio of 0.9-1.0. Consider submassive (intermediate risk) PE, and activation of Code PE by paging [phone]. 3. This was discussed by telephone with Dr. Fredia Sorrow on 08/15/2016 at 15:04 . Electronically Signed   By: Genevie Ann M.D.   On: 08/15/2016 15:05   Dg Chest Portable 1  View  Result Date: 08/15/2016 CLINICAL DATA:  Shortness of Breath EXAM: PORTABLE CHEST 1 VIEW COMPARISON:  02/11/2016 FINDINGS: Cardiac shadow is within normal limits. The lungs are well aerated bilaterally. No focal infiltrate or sizable effusion is seen. Previously noted right chest wall port has been removed. Postsurgical changes in the left breast are noted. IMPRESSION: No acute abnormality noted. Electronically Signed   By: Inez Catalina M.D.   On: 08/15/2016 13:02    Assessment: 56 y.o. with stage IIB triple negative left breast cancer, status post new adjuvant chemotherapy, left lumpectomy and sentinel lymph node biopsy on May 31, and bilateral breast reconstruction on 08/01/2016, was found to have bilateral PE and left leg DVT.  1. Bilateral acute submassive PE, provoked by surgeries  2. Acute left LE DVT 3. Right-sided chest pain, secondary to PE 4. Left-sided stage IIB triple negative breast cancer, status post neoadjuvant chemotherapy with complete pathological response, and recent surgical resection    Plan:  -she is on heparin drip, not a candidate for thrombolytics, per PCCM -I think her DVT and PE are provoked by her recent breast surgeries and immobility. She is cancer free at this point -I recommend anticoagulation for 6 months, given the severity of her DVT/PE -We discussed the options of medical condition, including Lovenox injection, Coumadin or Xarelto, I discussed the pros and cons of each agent. She leans towards coumadin, I reviewed the coumadin monitoring and dose adjustment, she can follow up with her PCP or my clinic for this  -I discussed her surgical path result, which showed no residual cancer after neoadjuvant chemotherapy. This is a very good prognostic factor, she is quite pleased.  -I will see her back in July when she starts radiation, or sooner if she need PT/INR monitoring in my clinic.    Truitt Merle, MD 08/16/2016  4:12 PM

## 2016-08-16 NOTE — Progress Notes (Signed)
Pt medicated w/Oxycontin PO, Dilaudid IVP, and Roxi PO per PRN orders; continues to report pain 8-10, stabbing pain w/simple shallow breaths and any movement.  Again, notified Dr. Allyson Sabal of uncontrolled pain. Patient and husband state the pain is much worse the last 1-1.5 hours between Dilaudid q4hr doses PRN. RN requested changing Dilaudid PRN to q3hrs instead of q4hrs; await Dr. Ledell Peoples response.

## 2016-08-16 NOTE — Progress Notes (Signed)
ANTICOAGULATION CONSULT NOTE - Follow Up Consult  Pharmacy Consult for heparin Indication: pulmonary embolus  Labs:  Recent Labs  08/15/16 1204 08/15/16 1234 08/15/16 1857  08/16/16 0219 08/16/16 0824 08/16/16 1507 08/16/16 2202  HGB 10.9* 10.5*  --   --  10.0*  --   --   --   HCT 33.0* 31.0*  --   --  30.9*  --   --   --   PLT 200  --   --   --  199  --   --   --   HEPARINUNFRC  --   --   --   < > 0.18* 0.83* 0.48 0.18*  CREATININE 0.74 0.70  --   --  0.79  --   --   --   TROPONINI  --   --  <0.03  --  <0.03  --   --   --   < > = values in this interval not displayed.   Assessment: 56yo female subtherapeutic on heparin after rate changes.  Goal of Therapy:  Heparin level 0.3-0.7 units/ml   Plan:  Will increase gtt by 2 units/kg/hr to 1500 units/hr and check level in 6hr.  Wynona Neat, PharmD, BCPS  08/16/2016,11:36 PM

## 2016-08-16 NOTE — Progress Notes (Signed)
[facility] PROGRESS NOTE  Grace Moyer XTK:[id number] DOB: [birth year] DOA: 08/15/2016   PCP: Maisie Fus, MD     Assessment/Plan: Principal Problem:   Acute pulmonary embolus [facility]) Active Problems:   Malignant neoplasm of upper-outer quadrant of left female breast ([place])   Acute pulmonary embolism ([place])   56 year old female with a history of breast cancer status post lumpectomy and reconstruction followed by completion of neoadjuvant chemotherapy, sent to the ED for evaluation of shortness of breath and left leg pain. Patient also had a low-grade fever, chest pain with deep inspiration, left calf pain for the last 3-4 days. CT chest showed bilateral lower lobe segmental PE, developing pulmonary infarcts, right heart strain, Patient received vancomycin/Zosyn, started on a heparin drip and admitted to step down for acute PE with right heart strain  Assessment and plan  Acute pulmonary embolus ([place]) CT evidence of right heart strain Continue step down Given recent surgery not a candidate for thrombolytics,PCCM consulted 6/15 Continue on heparin drip, NOAC in 24-48 hours  Empiric antibiotics due to evidence of pulmonary infarct, narrow antibiotics tomorrow if blood cultures negative 2-D echo to rule out right heart strain pending Venous doppler shows left lower extremity DVT  Breast cancer Status post neoadjuvant chemotherapy Platelets stable and hemoglobin 10.5 Patient is in the process of beginning adjuvant radiotherapy   Uncontrolled pain Uptitrating pain medications Started on OxyContin oxycodone, IV Dilaudid  DVT prophylaxsis heparin drip  Code Status:  Full code     Family Communication: Discussed in detail with the patient, all imaging results, lab results explained to the patient   Disposition Plan:   2 - 3 days      Consultants:  None  Procedures:  None  Antibiotics: Anti-infectives    Start     Dose/Rate Route Frequency Ordered  Stop   08/15/16 2100  vancomycin (VANCOCIN) IVPB 1000 mg/200 mL premix     1,000 mg 200 mL/hr over 60 Minutes Intravenous Every 8 hours 08/15/16 1826     08/15/16 2000  piperacillin-tazobactam (ZOSYN) IVPB 3.375 g     3.375 g 12.5 mL/hr over 240 Minutes Intravenous Every 8 hours 08/15/16 1826     08/15/16 1230  vancomycin (VANCOCIN) 1,500 mg in sodium chloride 0.9 % 500 mL IVPB     1,500 mg 250 mL/hr over 120 Minutes Intravenous  Once 08/15/16 1215 08/15/16 1555   08/15/16 1215  piperacillin-tazobactam (ZOSYN) IVPB 3.375 g     3.375 g 100 mL/hr over 30 Minutes Intravenous  Once 08/15/16 1210 08/15/16 1319   08/15/16 1215  vancomycin (VANCOCIN) IVPB 1000 mg/200 mL premix  Status:  Discontinued     1,000 mg 200 mL/hr over 60 Minutes Intravenous  Once 08/15/16 1210 08/15/16 1215         HPI/Subjective: Very uncomfortable, continues to have severe pain with   inspiration  Objective: Vitals:   08/16/16 0340 08/16/16 0800 08/16/16 0900 08/16/16 1245  BP:  102/64 102/64 114/79  Pulse: 83  74 81  Resp: (!) 27  (!) 27 (!) 28  Temp:  97.9 F (36.6 C) 98.5 F (36.9 C) 98 F (36.7 C)  TempSrc:  Oral Oral Oral  SpO2: 99%  99% 95%  Weight:      Height:        Intake/Output Summary (Last 24 hours) at 08/16/16 1421 Last data filed at 08/16/16 0800  Gross per 24 hour  Intake  536 ml  Output              950 ml  Net             -414 ml    Exam:  Examination:  General exam: Very uncomfortable due to chest pain  Respiratory system: Clear to auscultation. Respiratory effort normal. Cardiovascular system: S1 & S2 heard, RRR. No JVD, murmurs, rubs, gallops or clicks. No pedal edema. Gastrointestinal system: Abdomen is nondistended, soft and nontender. No organomegaly or masses felt. Normal bowel sounds heard. Central nervous system: Alert and oriented. No focal neurological deficits. Extremities: Symmetric 5 x 5 power. Skin: No rashes, lesions or ulcers Psychiatry:  Judgement and insight appear normal. Mood & affect appropriate.     Data Reviewed: I have personally reviewed following labs and imaging studies  Micro Results Recent Results (from the past 240 hour(s))  Blood culture (routine x 2)     Status: None (Preliminary result)   Collection Time: 08/15/16 12:00 PM  Result Value Ref Range Status   Specimen Description BLOOD RIGHT ANTECUBITAL  Final   Special Requests   Final    BOTTLES DRAWN AEROBIC AND ANAEROBIC Blood Culture adequate volume   Culture NO GROWTH < 24 HOURS  Final   Report Status PENDING  Incomplete  Blood culture (routine x 2)     Status: None (Preliminary result)   Collection Time: 08/15/16 12:23 PM  Result Value Ref Range Status   Specimen Description BLOOD RIGHT FOREARM  Final   Special Requests   Final    BOTTLES DRAWN AEROBIC AND ANAEROBIC Blood Culture adequate volume   Culture NO GROWTH < 24 HOURS  Final   Report Status PENDING  Incomplete  MRSA PCR Screening     Status: None   Collection Time: 08/15/16  5:41 PM  Result Value Ref Range Status   MRSA by PCR NEGATIVE NEGATIVE Final    Comment:        The GeneXpert MRSA Assay (FDA approved for NASAL specimens only), is one component of a comprehensive MRSA colonization surveillance program. It is not intended to diagnose MRSA infection nor to guide or monitor treatment for MRSA infections.     Radiology Reports Ct Angio Chest Pe W/cm &/or Wo Cm  Result Date: 08/15/2016 CLINICAL DATA:  56 year old female with shortness of breath, chest pain radiating to the right back. Left Breast cancer diagnosed in late 2017. EXAM: CT ANGIOGRAPHY CHEST WITH CONTRAST TECHNIQUE: Multidetector CT imaging of the chest was performed using the standard protocol during bolus administration of intravenous contrast. Multiplanar CT image reconstructions and MIPs were obtained to evaluate the vascular anatomy. CONTRAST:  100 mL Isovue 370 COMPARISON:  Portable chest radiograph 1227 hours  today. CT chest abdomen and pelvis 02/12/2016. FINDINGS: Cardiovascular: Good contrast bolus timing in the pulmonary arterial tree. Mild to moderate motion artifact. Motion artifact in the right lower lobe where enhancing atelectasis is present in the posterior basal segment of the right lower lobe, but there is a suspicious appearance of the medial basal segment pulmonary artery branch as seen on series 6, image 135. This segment extends to an area of airspace disease which is not enhancing. There is a similar finding in the left lower lobe seen on series 6, image 138, including nonenhancing distal airspace disease. Elsewhere no convincing convincing filling defect in the pulmonary arteries. Negative visualized aorta. Cardiac size is at the upper limits of normal. RV / LV ratio = 0.9-1 No pericardial effusion. Mediastinum/Nodes:  No mediastinal lymphadenopathy. Lungs/Pleura: Low lung volumes. Major airways are patent. Enhancing atelectasis in the posterior basal segment of the right lower lobe, but wedge-shaped nonenhancing airspace disease in both the superior segment of the left lower lobe and medial basal segment of the right lower lobe. Other mild atelectasis elsewhere. Trace right pleural effusion. Upper Abdomen: Negative visualized liver, gallbladder, spleen, pancreas, adrenal glands, kidneys, and bowel in the upper abdomen. Other findings: Postoperative changes to the left breast, including several small areas of subcutaneous gas. Scattered surgical clips. Musculoskeletal: No acute or suspicious osseous lesion identified. Review of the MIP images confirms the above findings. IMPRESSION: 1. Appearance highly suspicious for bilateral lower lobe segmental pulmonary emboli and developing small pulmonary infarcts. Trace right pleural effusion. 2. Possible right heart strain with an abnormal RV/LV ratio of 0.9-1.0. Consider submassive (intermediate risk) PE, and activation of Code PE by paging [phone]. 3. This  was discussed by telephone with Dr. Fredia Sorrow on 08/15/2016 at 15:04 . Electronically Signed   By: Genevie Ann M.D.   On: 08/15/2016 15:05   Nm Sentinel Node Inj-no Rpt (breast)  Result Date: 08/15/2016 There is no Radiologist interpretation  for this exam.  Mm Breast Surgical Specimen  Result Date: 07/31/2016 CLINICAL DATA:  Patient status post left lymph node resection. EXAM: SPECIMEN RADIOGRAPH OF THE LEFT BREAST COMPARISON:  Previous exam(s). FINDINGS: Status post excision of the left axillary lymph node. The radioactive seed and biopsy marker clip are present, completely intact, and were marked for pathology. IMPRESSION: Specimen radiograph of the left axillary lymph node. Electronically Signed   By: Lovey Newcomer M.D.   On: 07/31/2016 14:44   Mm Breast Surgical Specimen  Result Date: 07/31/2016 CLINICAL DATA:  57 year old female -evaluate surgical specimen following left lumpectomy for breast cancer. EXAM: SPECIMEN RADIOGRAPH OF THE LEFT BREAST COMPARISON:  Previous exam(s). FINDINGS: Status post excision of the left breast. The radioactive seed and biopsy marker clip are present, completely intact, and were marked for pathology. IMPRESSION: Specimen radiograph of the left breast. Electronically Signed   By: Margarette Canada M.D.   On: 07/31/2016 14:34   Dg Chest Portable 1 View  Result Date: 08/15/2016 CLINICAL DATA:  Shortness of Breath EXAM: PORTABLE CHEST 1 VIEW COMPARISON:  02/11/2016 FINDINGS: Cardiac shadow is within normal limits. The lungs are well aerated bilaterally. No focal infiltrate or sizable effusion is seen. Previously noted right chest wall port has been removed. Postsurgical changes in the left breast are noted. IMPRESSION: No acute abnormality noted. Electronically Signed   By: Inez Catalina M.D.   On: 08/15/2016 13:02   Mm Lt Radioactive Seed Loc Mammo Guide  Result Date: 07/30/2016 CLINICAL DATA:  Localize left breast cancer. EXAM: MAMMOGRAPHIC GUIDED RADIOACTIVE SEED  LOCALIZATION OF THE LEFT BREAST COMPARISON:  Previous exam(s). FINDINGS: Patient presents for radioactive seed localization prior to surgery. I met with the patient and we discussed the procedure of seed localization including benefits and alternatives. We discussed the high likelihood of a successful procedure. We discussed the risks of the procedure including infection, bleeding, tissue injury and further surgery. We discussed the low dose of radioactivity involved in the procedure. Informed, written consent was given. The usual time-out protocol was performed immediately prior to the procedure. Using mammographic guidance, sterile technique, 1% lidocaine and an I-125 radioactive seed, the biopsy clip was localized using a lateral approach. The follow-up mammogram images confirm the seed in the expected location and were marked for the surgeon. Follow-up survey of the  patient confirms presence of the radioactive seed. Order number of I-125 seed:  [id number]. Total activity:  3.818 millicuries  Reference Date: June 27, 2016 The patient tolerated the procedure well and was released from the [place]. She was given instructions regarding seed removal. IMPRESSION: Radioactive seed localization left breast. No apparent complications. Electronically Signed   By: Dorise Bullion III M.D   On: 07/30/2016 14:07   [place] Lt Radioactive Seed Loc  Result Date: 07/30/2016 CLINICAL DATA:  Localize left axillary lymph node. EXAM: ULTRASOUND GUIDED RADIOACTIVE SEED LOCALIZATION OF THE LEFT AXILLA COMPARISON:  Previous exam(s). FINDINGS: Patient presents for radioactive seed localization prior to surgery. I met with the patient and we discussed the procedure of seed localization including benefits and alternatives. We discussed the high likelihood of a successful procedure. We discussed the risks of the procedure including infection, bleeding, tissue injury and further surgery. We discussed the low dose of radioactivity  involved in the procedure. Informed, written consent was given. The usual time-out protocol was performed immediately prior to the procedure. Using ultrasound guidance, sterile technique, 1% lidocaine and an I-125 radioactive seed, the left axillary lymph node marked with a HydroMARK was localized using a lateral approach. The follow-up mammogram images confirm the seed in the expected location and were marked for the surgeon. Follow-up survey of the patient confirms presence of the radioactive seed. Order number of I-125 seed:  [id number]. Total activity: 7.893 millicuries Reference Date: Jul 23, 2016 The patient tolerated the procedure well and was released from the [place]. She was given instructions regarding seed removal. IMPRESSION: Radioactive seed localization left axilla. No apparent complications. Electronically Signed   By: Dorise Bullion III M.D   On: 07/30/2016 14:16     CBC  Recent Labs Lab 08/15/16 1204 08/15/16 1234 08/16/16 0219  WBC 9.1  --  6.8  HGB 10.9* 10.5* 10.0*  HCT 33.0* 31.0* 30.9*  PLT 200  --  199  MCV 93.5  --  93.6  MCH 30.9  --  30.3  MCHC 33.0  --  32.4  RDW 12.6  --  12.6  LYMPHSABS 0.6*  --   --   MONOABS 0.6  --   --   EOSABS 0.0  --   --   BASOSABS 0.0  --   --     Chemistries   Recent Labs Lab 08/15/16 1204 08/15/16 1234 08/15/16 1857 08/16/16 0219  NA 134* 137  --  132*  K 3.9 3.9  --  3.6  CL 104 102  --  102  CO2 21*  --   --  21*  GLUCOSE 114* 111*  --  119*  BUN 10 11  --  9  CREATININE 0.74 0.70  --  0.79  CALCIUM 9.2  --   --  8.9  MG  --   --  1.8  --   AST 38  --   --  28  ALT 41  --   --  32  ALKPHOS 182*  --   --  159*  BILITOT 1.1  --   --  1.3*   ------------------------------------------------------------------------------------------------------------------ estimated creatinine clearance is 83.9 mL/min (by C-G formula based on SCr of 0.79  mg/dL). ------------------------------------------------------------------------------------------------------------------  Recent Labs  08/15/16 1857  HGBA1C 4.6*   ------------------------------------------------------------------------------------------------------------------ No results for input(s): CHOL, HDL, LDLCALC, TRIG, CHOLHDL, LDLDIRECT in the last 72 hours. ------------------------------------------------------------------------------------------------------------------ No results for input(s): TSH, T4TOTAL, T3FREE, THYROIDAB in the last 72 hours.  Invalid input(s):  FREET3 ------------------------------------------------------------------------------------------------------------------ No results for input(s): VITAMINB12, FOLATE, FERRITIN, TIBC, IRON, RETICCTPCT in the last 72 hours.  Coagulation profile No results for input(s): INR, PROTIME in the last 168 hours.  No results for input(s): DDIMER in the last 72 hours.  Cardiac Enzymes  Recent Labs Lab 08/15/16 1857 08/16/16 0219  TROPONINI <0.03 <0.03   ------------------------------------------------------------------------------------------------------------------ Invalid input(s): POCBNP   CBG: No results for input(s): GLUCAP in the last 168 hours.     Studies: Ct Angio Chest Pe W/cm &/or Wo Cm  Result Date: 08/15/2016 CLINICAL DATA:  56 year old female with shortness of breath, chest pain radiating to the right back. Left Breast cancer diagnosed in late 2017. EXAM: CT ANGIOGRAPHY CHEST WITH CONTRAST TECHNIQUE: Multidetector CT imaging of the chest was performed using the standard protocol during bolus administration of intravenous contrast. Multiplanar CT image reconstructions and MIPs were obtained to evaluate the vascular anatomy. CONTRAST:  100 mL Isovue 370 COMPARISON:  Portable chest radiograph 1227 hours today. CT chest abdomen and pelvis 02/12/2016. FINDINGS: Cardiovascular: Good contrast bolus  timing in the pulmonary arterial tree. Mild to moderate motion artifact. Motion artifact in the right lower lobe where enhancing atelectasis is present in the posterior basal segment of the right lower lobe, but there is a suspicious appearance of the medial basal segment pulmonary artery branch as seen on series 6, image 135. This segment extends to an area of airspace disease which is not enhancing. There is a similar finding in the left lower lobe seen on series 6, image 138, including nonenhancing distal airspace disease. Elsewhere no convincing convincing filling defect in the pulmonary arteries. Negative visualized aorta. Cardiac size is at the upper limits of normal. RV / LV ratio = 0.9-1 No pericardial effusion. Mediastinum/Nodes: No mediastinal lymphadenopathy. Lungs/Pleura: Low lung volumes. Major airways are patent. Enhancing atelectasis in the posterior basal segment of the right lower lobe, but wedge-shaped nonenhancing airspace disease in both the superior segment of the left lower lobe and medial basal segment of the right lower lobe. Other mild atelectasis elsewhere. Trace right pleural effusion. Upper Abdomen: Negative visualized liver, gallbladder, spleen, pancreas, adrenal glands, kidneys, and bowel in the upper abdomen. Other findings: Postoperative changes to the left breast, including several small areas of subcutaneous gas. Scattered surgical clips. Musculoskeletal: No acute or suspicious osseous lesion identified. Review of the MIP images confirms the above findings. IMPRESSION: 1. Appearance highly suspicious for bilateral lower lobe segmental pulmonary emboli and developing small pulmonary infarcts. Trace right pleural effusion. 2. Possible right heart strain with an abnormal RV/LV ratio of 0.9-1.0. Consider submassive (intermediate risk) PE, and activation of Code PE by paging [phone]. 3. This was discussed by telephone with Dr. Fredia Sorrow on 08/15/2016 at 15:04 . Electronically  Signed   By: Genevie Ann M.D.   On: 08/15/2016 15:05   Dg Chest Portable 1 View  Result Date: 08/15/2016 CLINICAL DATA:  Shortness of Breath EXAM: PORTABLE CHEST 1 VIEW COMPARISON:  02/11/2016 FINDINGS: Cardiac shadow is within normal limits. The lungs are well aerated bilaterally. No focal infiltrate or sizable effusion is seen. Previously noted right chest wall port has been removed. Postsurgical changes in the left breast are noted. IMPRESSION: No acute abnormality noted. Electronically Signed   By: Inez Catalina M.D.   On: 08/15/2016 13:02      Lab Results  Component Value Date   HGBA1C 4.6 (L) 08/15/2016   Lab Results  Component Value Date   CREATININE 0.79 08/16/2016  Scheduled Meds: . feeding supplement (ENSURE ENLIVE)  237 mL Oral BID BM  . oxyCODONE  20 mg Oral Q12H   Continuous Infusions: . heparin 1,350 Units/hr (08/16/16 0934)  . piperacillin-tazobactam (ZOSYN)  IV Stopped (08/16/16 0947)  . vancomycin Stopped (08/16/16 1355)     LOS: 1 day    Time spent: >30 MINS    Reyne Dumas  [facility] Pager [phone]. If 7PM-7AM, please contact night-coverage at [email or web address], password [facility] 08/16/2016, 2:21 PM  LOS: 1 day

## 2016-08-16 NOTE — Progress Notes (Signed)
ANTICOAGULATION CONSULT NOTE - Follow Up Consult  Pharmacy Consult for heparin Indication: pulmonary embolus  Labs:  Recent Labs  08/15/16 1204 08/15/16 1234 08/15/16 1857 08/16/16 0219 08/16/16 0824  HGB 10.9* 10.5*  --  10.0*  --   HCT 33.0* 31.0*  --  30.9*  --   PLT 200  --   --  199  --   HEPARINUNFRC  --   --   --  0.18* 0.83*  CREATININE 0.74 0.70  --  0.79  --   TROPONINI  --   --  <0.03 <0.03  --     Assessment: 55yo female supratherapeutic (after being subtherapeutic) with heparin level 0.83 on heparin with initial dosing for PE w/ RHS; no gtt issues per RN. CBC low, but stable. No s/s bleeding per RN.   Goal of Therapy:  Heparin level 0.3-0.7 units/ml  Monitor platelets    Plan:  Reduce heparin gtt to 1350 units/hr  Heparin level in 6 hours Daily heparin level and CBC Monitor for s/s bleeding   Argie Ramming, PharmD Pharmacy Resident  Pager [phone] 08/16/16 9:30 AM

## 2016-08-16 NOTE — Progress Notes (Signed)
ANTICOAGULATION CONSULT NOTE - Follow Up Consult  Pharmacy Consult for heparin Indication: pulmonary embolus  Labs:  Recent Labs  08/15/16 1204 08/15/16 1234 08/15/16 1857 08/16/16 0219  HGB 10.9* 10.5*  --  10.0*  HCT 33.0* 31.0*  --  30.9*  PLT 200  --   --  199  HEPARINUNFRC  --   --   --  0.18*  CREATININE 0.74 0.70  --  0.79  TROPONINI  --   --  <0.03 <0.03    Assessment: 56yo female subtherapeutic on heparin with initial dosing for PE w/ RHS; no gtt issues per RN.  Goal of Therapy:  Heparin level 0.3-0.7 units/ml   Plan:  Will rebolus with heparin 3000 units and increase gtt by 3-4 units/kg/hr to 1500 units/hr and check level in 6hr.  Wynona Neat, PharmD, BCPS  08/16/2016,3:27 AM

## 2016-08-16 NOTE — Progress Notes (Signed)
Nutrition Brief Note  Patient identified on the Malnutrition Screening Tool (MST) Report  Wt Readings from Last 15 Encounters:  08/15/16 191 lb 2.2 oz (86.7 kg)  08/14/16 190 lb 6.4 oz (86.4 kg)  08/05/16 192 lb 6.4 oz (87.3 kg)  07/31/16 193 lb (87.5 kg)  07/23/16 195 lb 12.8 oz (88.8 kg)  07/02/16 188 lb 1.6 oz (85.3 kg)  06/18/16 193 lb (87.5 kg)  05/22/16 192 lb 8 oz (87.3 kg)  05/21/16 190 lb 9.6 oz (86.5 kg)  05/07/16 192 lb 14.4 oz (87.5 kg)  04/23/16 192 lb 8 oz (87.3 kg)  04/16/16 191 lb 1.6 oz (86.7 kg)  04/09/16 195 lb 1.6 oz (88.5 kg)  03/26/16 198 lb 3.2 oz (89.9 kg)  03/12/16 200 lb 8 oz (90.9 kg)   Body mass index is 33.86 kg/m. Patient meets criteria for Obese based on current BMI.   On RD arrival, over half a dozen other individuals were present. Did not discuss any health information. Did ask how she was eating and if there was anything that could be provided at this time. She said she is eating okay, but asked to switch from Ensure to Boost.   Current diet order is [employer]. There is no documented meal intake at this time.    No nutrition interventions warranted at this time. RD can follow up to conduct assessment when more appropriate  Burtis Junes RD, LDN, CNSC Clinical Nutrition Pager: [phone] 08/16/2016 2:46 PM

## 2016-08-16 NOTE — Progress Notes (Signed)
Pt resting at this time; not continually squealing w/sharp pain every time she takes a breath.  Dr. Allyson Sabal has made adjustments to pain meds; will continue to monitor closely.

## 2016-08-16 NOTE — Progress Notes (Signed)
ANTICOAGULATION CONSULT NOTE - Follow Up Consult  Pharmacy Consult for Heparin Indication: DVT and PE  Allergies  Allergen Reactions  . Hydrocodone Other (See Comments)    Did not help with pain and kept her up all night    Patient Measurements: Height: 5\' 3"  (160 cm) Weight: 191 lb 2.2 oz (86.7 kg) IBW/kg (Calculated) : 52.4 Heparin Dosing Weight:    Vital Signs: Temp: 98 F (36.7 C) (06/16 1245) Temp Source: Oral (06/16 1245) BP: 114/79 (06/16 1245) Pulse Rate: 81 (06/16 1245)  Labs:  Recent Labs  08/15/16 1204 08/15/16 1234 08/15/16 1857 08/16/16 0219 08/16/16 0824 08/16/16 1507  HGB 10.9* 10.5*  --  10.0*  --   --   HCT 33.0* 31.0*  --  30.9*  --   --   PLT 200  --   --  199  --   --   HEPARINUNFRC  --   --   --  0.18* 0.83* 0.48  CREATININE 0.74 0.70  --  0.79  --   --   TROPONINI  --   --  <0.03 <0.03  --   --     Estimated Creatinine Clearance: 83.9 mL/min (by C-G formula based on SCr of 0.79 mg/dL).  Assessment:  Anticoag: PE with breast cancer on IV heparin. Also +DVT in LLE. Heparin level 0.18>0.83> HL 0.48 now in goal. Anemic but stable  Goal of Therapy:  Heparin level 0.3-0.7 units/ml Monitor platelets by anticoagulation protocol: Yes   Plan:  Continue Heparin drip at 1350 units / hr Confirm therapeutic level in 6 hrs. Daily HL and CBC   Pernella Ackerley S. Alford Highland, PharmD, BCPS Clinical Staff Pharmacist Pager [phone]  Eilene Ghazi Stillinger 08/16/2016,4:26 PM

## 2016-08-16 NOTE — Progress Notes (Signed)
Dr Allyson Sabal updated re: patient's uncontrolled pain - RN requested PCA for more consistent pain control; received order for Oxycontin PO q12hrs.  Will continue to monitor.  At present, patient reports pain 10/10 and is obviously in distress.

## 2016-08-17 ENCOUNTER — Inpatient Hospital Stay (HOSPITAL_COMMUNITY): Payer: 59

## 2016-08-17 DIAGNOSIS — I824Z1 Acute embolism and thrombosis of unspecified deep veins of right distal lower extremity: Secondary | ICD-10-CM

## 2016-08-17 DIAGNOSIS — R52 Pain, unspecified: Secondary | ICD-10-CM

## 2016-08-17 DIAGNOSIS — I2699 Other pulmonary embolism without acute cor pulmonale: Secondary | ICD-10-CM

## 2016-08-17 LAB — COMPREHENSIVE METABOLIC PANEL
ALT: 25 U/L (ref 14–54)
AST: 17 U/L (ref 15–41)
Albumin: 3 g/dL — ABNORMAL LOW (ref 3.5–5.0)
Alkaline Phosphatase: 135 U/L — ABNORMAL HIGH (ref 38–126)
Anion gap: 9 (ref 5–15)
BUN: 7 mg/dL (ref 6–20)
CO2: 25 mmol/L (ref 22–32)
Calcium: 9 mg/dL (ref 8.9–10.3)
Chloride: 99 mmol/L — ABNORMAL LOW (ref 101–111)
Creatinine, Ser: 0.8 mg/dL (ref 0.44–1.00)
GFR calc Af Amer: >60 mL/min (ref >60)
GFR calc non Af Amer: >60 mL/min (ref >60)
Glucose, Bld: 148 mg/dL — ABNORMAL HIGH (ref 65–99)
Potassium: 3.6 mmol/L (ref 3.5–5.1)
Sodium: 133 mmol/L — ABNORMAL LOW (ref 135–145)
Total Bilirubin: 0.9 mg/dL (ref 0.3–1.2)
Total Protein: 5.8 g/dL — ABNORMAL LOW (ref 6.5–8.1)

## 2016-08-17 LAB — CBC
HCT: 29.3 % — ABNORMAL LOW (ref 36.0–46.0)
Hemoglobin: 9.6 g/dL — ABNORMAL LOW (ref 12.0–15.0)
MCH: 30.5 pg (ref 26.0–34.0)
MCHC: 32.8 g/dL (ref 30.0–36.0)
MCV: 93 fL (ref 78.0–100.0)
Platelets: 188 10*3/uL (ref 150–400)
RBC: 3.15 MIL/uL — ABNORMAL LOW (ref 3.87–5.11)
RDW: 12.6 % (ref 11.5–15.5)
WBC: 5.4 10*3/uL (ref 4.0–10.5)

## 2016-08-17 LAB — ECHOCARDIOGRAM COMPLETE
Height: 63 in
Weight: 3058.22 oz

## 2016-08-17 LAB — VANCOMYCIN, TROUGH: Vancomycin Tr: 32 ug/mL (ref 15–20)

## 2016-08-17 LAB — HEPARIN LEVEL (UNFRACTIONATED): Heparin Unfractionated: 0.58 IU/mL (ref 0.30–0.70)

## 2016-08-17 MED ORDER — RIVAROXABAN 15 MG PO TABS
15.0000 mg | ORAL_TABLET | Freq: Two times a day (BID) | ORAL | Status: DC
  Administered 2016-08-18 (×2): 15 mg via ORAL
  Filled 2016-08-17 (×2): qty 1

## 2016-08-17 MED ORDER — RIVAROXABAN 20 MG PO TABS: 20.0000 mg | ORAL_TABLET | Freq: Every day | ORAL | Status: DC

## 2016-08-17 MED ORDER — PERFLUTREN LIPID MICROSPHERE
1.0000 mL | INTRAVENOUS | Status: AC | PRN
  Administered 2016-08-17: 3 mL via INTRAVENOUS
  Filled 2016-08-17: qty 10

## 2016-08-17 MED ORDER — RIVAROXABAN 15 MG PO TABS: 15.0000 mg | ORAL_TABLET | Freq: Two times a day (BID) | ORAL | Status: DC

## 2016-08-17 NOTE — Progress Notes (Signed)
[place] for Vancomycin Indication: sepsis  Allergies  Allergen Reactions  . Hydrocodone Other (See Comments)    Did not help with pain and kept her up all night    Patient Measurements: Height: 5\' 3"  (160 cm) Weight: 191 lb 2.2 oz (86.7 kg) IBW/kg (Calculated) : 52.4 Adjusted Body Weight:   Vital Signs: Temp: 98.5 F (36.9 C) (06/17 1212) Temp Source: Oral (06/17 1212) BP: 109/66 (06/17 1212) Pulse Rate: 81 (06/17 1212) Intake/Output from previous day: 06/16 0701 - 06/17 0700 In: 2633 [P.O.:1090; I.V.:493; IV Piggyback:1050] Out: 2300 [Urine:2300] Intake/Output from this shift: No intake/output data recorded.  Labs:  Recent Labs  08/15/16 1204 08/15/16 1234 08/16/16 0219 08/17/16 0615  WBC 9.1  --  6.8 5.4  HGB 10.9* 10.5* 10.0* 9.6*  PLT 200  --  199 188  CREATININE 0.74 0.70 0.79 0.80   Estimated Creatinine Clearance: 83.9 mL/min (by C-G formula based on SCr of 0.8 mg/dL).  Recent Labs  08/17/16 1226  [place] 32*     Microbiology: Recent Results (from the past 720 hour(s))  Blood culture (routine x 2)     Status: None (Preliminary result)   Collection Time: 08/15/16 12:00 PM  Result Value Ref Range Status   Specimen Description BLOOD RIGHT ANTECUBITAL  Final   Special Requests   Final    BOTTLES DRAWN AEROBIC AND ANAEROBIC Blood Culture adequate volume   Culture NO GROWTH 2 DAYS  Final   Report Status PENDING  Incomplete  Blood culture (routine x 2)     Status: None (Preliminary result)   Collection Time: 08/15/16 12:23 PM  Result Value Ref Range Status   Specimen Description BLOOD RIGHT FOREARM  Final   Special Requests   Final    BOTTLES DRAWN AEROBIC AND ANAEROBIC Blood Culture adequate volume   Culture NO GROWTH 2 DAYS  Final   Report Status PENDING  Incomplete  MRSA PCR Screening     Status: None   Collection Time: 08/15/16  5:41 PM  Result Value Ref Range Status   MRSA by PCR NEGATIVE NEGATIVE Final     Comment:        The GeneXpert MRSA Assay (FDA approved for NASAL specimens only), is one component of a comprehensive MRSA colonization surveillance program. It is not intended to diagnose MRSA infection nor to guide or monitor treatment for MRSA infections.     Medical History: Past Medical History:  Diagnosis Date  . Anxiety   . Family history of breast cancer   . Family history of colon cancer   . Malignant neoplasm of upper-outer quadrant of left female breast ([place]) 01/31/2016  . Neuromyelitis optica ([place])     Assessment:  ID: Sepsis. Tmax 100.2. WBC 5.4. SCr WNL. Vanco Trough =32 Vanco 6/15>> Zosyn 6/15>>  6/15 BC x 2>>  6/17 VT: 32: Hold and check random in AM  Goal of Therapy:  Vancomycin trough level 15-20 mcg/ml  Plan:  Hold Vancomycin Check random Vanco level in AM to assess clearance.   Diani Jillson S. Alford Highland, PharmD, BCPS Clinical Staff Pharmacist Pager [phone]  Eilene Ghazi Stillinger 08/17/2016,2:47 PM

## 2016-08-17 NOTE — Progress Notes (Signed)
PROGRESS NOTE    Grace Moyer  PRF:[id number] DOB: 10-Jun-1960 DOA: 08/15/2016 PCP: Grace Fus, MD   Brief Narrative:  56 year old WF PMHx stage IIB triple negative left breast cancer Breast cancer S/P Left breast lumpectomy and sentinel lymph node biopsy on 07/31/2016, and a bilateral breast reduction on 08/01/2016 followed by completion of neoadjuvant chemotherapy.  Sent to the ED for evaluation of shortness of breath and left leg pain. Patient also had a low-grade fever, chest pain with deep inspiration, left calf pain for the last 3-4 days.   Subjective: 6/17  A/O 4 , states this is her first PE. Positive SOB, positive CP. Negative lower extremity pain. States has completed chemotherapy but is scheduled for XRT   Assessment & Plan:   Principal Problem:   Acute pulmonary embolus ([name]) Active Problems:   Malignant neoplasm of upper-outer quadrant of left female breast ([name])   Acute pulmonary embolism ([facility])   Acute pulmonary embolus/Acute LLE DVT  -CT evidence of right heart strain -Given recent surgery not a candidate for thrombolytics,PCCM consulted 6/15 -Continue on heparin drip, NOAC in 24-48 hours: Will start patient on Xarelto on 6/18  -No evidence of infection DC antibiotics -Echocardiogram no evidence of heart strain see results below  -Venous doppler shows left lower extremity DVT: See results below -Incentive spirometry  -Nurse case manager consult placed for Xarelto coupon -Ambulatory SPO2 pending for 6/18. Home O2?  Left Breast cancer -Status post neoadjuvant chemotherapy -Platelets stable and hemoglobin 10.5 -Patient is in the process of beginning adjuvant radiotherapy: Per Oncology  Uncontrolled pain -Continue current pain management regimen.     DVT prophylaxis: Heparin drip--> Xarelto Code Status: Full Family Communication: Husband in multiple family members present Disposition Plan: Discharge after second dose of  Xarelto     Consultants:  Oncology Dr Truitt Merle,    Procedures/Significant Events:  6/16 bilateral lower extremity Doppler:-Acute DVT left distal femoral vein, left popliteal vein, left posterial tibial vein, and left peroneal vein. - Negative DVT RLE  6/17 Echocardiogram:Left ventricle: mild LVH. LVEF = 60% to 65%.      VENTILATOR SETTINGS: None   Cultures 6/15 blood NGTD 6/15 MRSA by PCR negative   Antimicrobials: Anti-infectives    Start     Stop   08/15/16 2100  vancomycin (VANCOCIN) IVPB 1000 mg/200 mL premix  Status:  Discontinued     08/17/16 1447   08/15/16 2000  piperacillin-tazobactam (ZOSYN) IVPB 3.375 g  Status:  Discontinued     08/17/16 1616   08/15/16 1230  vancomycin (VANCOCIN) 1,500 mg in sodium chloride 0.9 % 500 mL IVPB     08/15/16 1555   08/15/16 1215  piperacillin-tazobactam (ZOSYN) IVPB 3.375 g     08/15/16 1319   08/15/16 1215  vancomycin (VANCOCIN) IVPB 1000 mg/200 mL premix  Status:  Discontinued     08/15/16 1215       Devices    LINES / TUBES:      Continuous Infusions: . heparin 1,500 Units/hr (08/17/16 0600)  . piperacillin-tazobactam (ZOSYN)  IV 3.375 g (08/17/16 9357)  . vancomycin Stopped (08/17/16 0177)     Objective: Vitals:   08/17/16 0400 08/17/16 0444 08/17/16 0520 08/17/16 0820  BP: (!) 94/54   99/62  Pulse: 68 73 71 77  Resp: (!) 21 18 12 14   Temp:    98.4 F (36.9 C)  TempSrc:    Oral  SpO2: 91% 100% 98% 91%  Weight:      Height:  Intake/Output Summary (Last 24 hours) at 08/17/16 6948 Last data filed at 08/17/16 0700  Gross per 24 hour  Intake          2633.02 ml  Output             1800 ml  Net           833.02 ml   Filed Weights   08/15/16 1127 08/15/16 1700  Weight: 188 lb (85.3 kg) 191 lb 2.2 oz (86.7 kg)    Examination:  General: A/O 4, positive acute respiratory distress Eyes: negative scleral hemorrhage, negative anisocoria, negative icterus ENT: Negative Runny nose,  negative gingival bleeding, Neck:  Negative scars, masses, torticollis, lymphadenopathy, JVD Lungs: Clear to auscultation bilaterally without wheezes or crackles Cardiovascular: Regular rate and rhythm without murmur gallop or rub normal S1 and S2 Abdomen: negative abdominal pain, nondistended, positive soft, bowel sounds, no rebound, no ascites, no appreciable mass Extremities: No significant cyanosis, clubbing, or edema bilateral lower extremities Skin: Negative rashes, lesions, ulcers Psychiatric:  Negative depression, negative anxiety, negative fatigue, negative mania  Central nervous system:  Cranial nerves II through XII intact, tongue/uvula midline, all extremities muscle strength 5/5, sensation intact throughout,  negative dysarthria, negative expressive aphasia, negative receptive aphasia.  .     Data Reviewed: Care during the described time interval was provided by me .  I have reviewed this patient's available data, including medical history, events of note, physical examination, and all test results as part of my evaluation. I have personally reviewed and interpreted all radiology studies.  CBC:  Recent Labs Lab 08/15/16 1204 08/15/16 1234 08/16/16 0219 08/17/16 0615  WBC 9.1  --  6.8 5.4  NEUTROABS 7.8*  --   --   --   HGB 10.9* 10.5* 10.0* 9.6*  HCT 33.0* 31.0* 30.9* 29.3*  MCV 93.5  --  93.6 93.0  PLT 200  --  199 546   Basic Metabolic Panel:  Recent Labs Lab 08/15/16 1204 08/15/16 1234 08/15/16 1857 08/16/16 0219 08/17/16 0615  NA 134* 137  --  132* 133*  K 3.9 3.9  --  3.6 3.6  CL 104 102  --  102 99*  CO2 21*  --   --  21* 25  GLUCOSE 114* 111*  --  119* 148*  BUN 10 11  --  9 7  CREATININE 0.74 0.70  --  0.79 0.80  CALCIUM 9.2  --   --  8.9 9.0  MG  --   --  1.8  --   --    GFR: Estimated Creatinine Clearance: 83.9 mL/min (by C-G formula based on SCr of 0.8 mg/dL). Liver Function Tests:  Recent Labs Lab 08/15/16 1204 08/16/16 0219  08/17/16 0615  AST 38 28 17  ALT 41 32 25  ALKPHOS 182* 159* 135*  BILITOT 1.1 1.3* 0.9  PROT 6.6 6.1* 5.8*  ALBUMIN 3.8 3.3* 3.0*   No results for input(s): LIPASE, AMYLASE in the last 168 hours. No results for input(s): AMMONIA in the last 168 hours. Coagulation Profile: No results for input(s): INR, PROTIME in the last 168 hours. Cardiac Enzymes:  Recent Labs Lab 08/15/16 1857 08/16/16 0219  TROPONINI <0.03 <0.03   BNP (last 3 results) No results for input(s): PROBNP in the last 8760 hours. HbA1C:  Recent Labs  08/15/16 1857  HGBA1C 4.6*   CBG: No results for input(s): GLUCAP in the last 168 hours. Lipid Profile: No results for input(s): CHOL, HDL, LDLCALC, TRIG, CHOLHDL,  LDLDIRECT in the last 72 hours. Thyroid Function Tests: No results for input(s): TSH, T4TOTAL, FREET4, T3FREE, THYROIDAB in the last 72 hours. Anemia Panel: No results for input(s): VITAMINB12, FOLATE, FERRITIN, TIBC, IRON, RETICCTPCT in the last 72 hours. Urine analysis:    Component Value Date/Time   COLORURINE YELLOW 08/14/2015 [place] 08/14/2015 0841   LABSPEC 1.015 08/14/2015 0841   PHURINE 6.5 08/14/2015 0841   GLUCOSEU NEGATIVE 08/14/2015 0841   HGBUR NEGATIVE 08/14/2015 0841   BILIRUBINUR SMALL (A) 08/14/2015 0841   KETONESUR >80 (A) 08/14/2015 0841   PROTEINUR TRACE (A) 08/14/2015 0841   UROBILINOGEN 0.2 04/08/2007 0620   NITRITE NEGATIVE 08/14/2015 0841   LEUKOCYTESUR NEGATIVE 08/14/2015 0841   Sepsis Labs: @LABRCNTIP (procalcitonin:4,lacticidven:4)  ) Recent Results (from the past 240 hour(s))  Blood culture (routine x 2)     Status: None (Preliminary result)   Collection Time: 08/15/16 12:00 PM  Result Value Ref Range Status   Specimen Description BLOOD RIGHT ANTECUBITAL  Final   Special Requests   Final    BOTTLES DRAWN AEROBIC AND ANAEROBIC Blood Culture adequate volume   Culture NO GROWTH < 24 HOURS  Final   Report Status PENDING  Incomplete  Blood  culture (routine x 2)     Status: None (Preliminary result)   Collection Time: 08/15/16 12:23 PM  Result Value Ref Range Status   Specimen Description BLOOD RIGHT FOREARM  Final   Special Requests   Final    BOTTLES DRAWN AEROBIC AND ANAEROBIC Blood Culture adequate volume   Culture NO GROWTH < 24 HOURS  Final   Report Status PENDING  Incomplete  MRSA PCR Screening     Status: None   Collection Time: 08/15/16  5:41 PM  Result Value Ref Range Status   MRSA by PCR NEGATIVE NEGATIVE Final    Comment:        The GeneXpert MRSA Assay (FDA approved for NASAL specimens only), is one component of a comprehensive MRSA colonization surveillance program. It is not intended to diagnose MRSA infection nor to guide or monitor treatment for MRSA infections.          Radiology Studies: Ct Angio Chest Pe W/cm &/or Wo Cm  Result Date: 08/15/2016 CLINICAL DATA:  56 year old female with shortness of breath, chest pain radiating to the right back. Left Breast cancer diagnosed in late 2017. EXAM: CT ANGIOGRAPHY CHEST WITH CONTRAST TECHNIQUE: Multidetector CT imaging of the chest was performed using the standard protocol during bolus administration of intravenous contrast. Multiplanar CT image reconstructions and MIPs were obtained to evaluate the vascular anatomy. CONTRAST:  100 mL Isovue 370 COMPARISON:  Portable chest radiograph 1227 hours today. CT chest abdomen and pelvis 02/12/2016. FINDINGS: Cardiovascular: Good contrast bolus timing in the pulmonary arterial tree. Mild to moderate motion artifact. Motion artifact in the right lower lobe where enhancing atelectasis is present in the posterior basal segment of the right lower lobe, but there is a suspicious appearance of the medial basal segment pulmonary artery branch as seen on series 6, image 135. This segment extends to an area of airspace disease which is not enhancing. There is a similar finding in the left lower lobe seen on series 6, image  138, including nonenhancing distal airspace disease. Elsewhere no convincing convincing filling defect in the pulmonary arteries. Negative visualized aorta. Cardiac size is at the upper limits of normal. RV / LV ratio = 0.9-1 No pericardial effusion. Mediastinum/Nodes: No mediastinal lymphadenopathy. Lungs/Pleura: Low lung volumes. Major  airways are patent. Enhancing atelectasis in the posterior basal segment of the right lower lobe, but wedge-shaped nonenhancing airspace disease in both the superior segment of the left lower lobe and medial basal segment of the right lower lobe. Other mild atelectasis elsewhere. Trace right pleural effusion. Upper Abdomen: Negative visualized liver, gallbladder, spleen, pancreas, adrenal glands, kidneys, and bowel in the upper abdomen. Other findings: Postoperative changes to the left breast, including several small areas of subcutaneous gas. Scattered surgical clips. Musculoskeletal: No acute or suspicious osseous lesion identified. Review of the MIP images confirms the above findings. IMPRESSION: 1. Appearance highly suspicious for bilateral lower lobe segmental pulmonary emboli and developing small pulmonary infarcts. Trace right pleural effusion. 2. Possible right heart strain with an abnormal RV/LV ratio of 0.9-1.0. Consider submassive (intermediate risk) PE, and activation of Code PE by paging [phone]. 3. This was discussed by telephone with Dr. Fredia Sorrow on 08/15/2016 at 15:04 . Electronically Signed   By: Genevie Ann M.D.   On: 08/15/2016 15:05   Dg Chest Portable 1 View  Result Date: 08/15/2016 CLINICAL DATA:  Shortness of Breath EXAM: PORTABLE CHEST 1 VIEW COMPARISON:  02/11/2016 FINDINGS: Cardiac shadow is within normal limits. The lungs are well aerated bilaterally. No focal infiltrate or sizable effusion is seen. Previously noted right chest wall port has been removed. Postsurgical changes in the left breast are noted. IMPRESSION: No acute abnormality noted.  Electronically Signed   By: Inez Catalina M.D.   On: 08/15/2016 13:02        Scheduled Meds: . lactose free nutrition  237 mL Oral BID BM  . oxyCODONE  20 mg Oral Q12H   Continuous Infusions: . heparin 1,500 Units/hr (08/17/16 0600)  . piperacillin-tazobactam (ZOSYN)  IV 3.375 g (08/17/16 4982)  . vancomycin Stopped (08/17/16 0635)     LOS: 2 days    Time spent: 40 minutes    WOODS, Geraldo Docker, MD [facility] Pager [phone]   If 7PM-7AM, please contact night-coverage [email or web address] Password [email or web address] 08/17/2016, 9:07 AM

## 2016-08-17 NOTE — Progress Notes (Signed)
ANTICOAGULATION CONSULT NOTE - Follow Up Consult  Pharmacy Consult for Heparin Indication: DVT and PE  Allergies  Allergen Reactions  . Hydrocodone Other (See Comments)    Did not help with pain and kept her up all night    Patient Measurements: Height: 5\' 3"  (160 cm) Weight: 191 lb 2.2 oz (86.7 kg) IBW/kg (Calculated) : 52.4 Heparin Dosing Weight:    Vital Signs: Temp: 98.4 F (36.9 C) (06/17 0820) Temp Source: Oral (06/17 0820) BP: 99/62 (06/17 0820) Pulse Rate: 77 (06/17 0820)  Labs:  Recent Labs  08/15/16 1204 08/15/16 1234 08/15/16 1857 08/16/16 0219  08/16/16 1507 08/16/16 2202 08/17/16 0615  HGB 10.9* 10.5*  --  10.0*  --   --   --  9.6*  HCT 33.0* 31.0*  --  30.9*  --   --   --  29.3*  PLT 200  --   --  199  --   --   --  188  HEPARINUNFRC  --   --   --  0.18*  < > 0.48 0.18* 0.58  CREATININE 0.74 0.70  --  0.79  --   --   --  0.80  TROPONINI  --   --  <0.03 <0.03  --   --   --   --   < > = values in this interval not displayed.  Estimated Creatinine Clearance: 83.9 mL/min (by C-G formula based on SCr of 0.8 mg/dL).  Assessment:  Anticoag: PE with breast cancer on IV heparin. Also +DVT in LLE. HL 0.58. Anemic but stable. NOAC in 24-48 hours per hospitalist notes, but Heme/Onc usually chooses LMWH vs Warfarin.)   Goal of Therapy:  Heparin level 0.3-0.7 units/ml Monitor platelets by anticoagulation protocol: Yes   Plan:  Continue Heparin drip at 1500 units / hr Daily HL and CBC Oral anticoagulation in 24-48 hrs  Bobbie Virden S. Alford Highland, PharmD, BCPS Clinical Staff Pharmacist Pager [phone]  Eilene Ghazi Stillinger 08/17/2016,10:43 AM

## 2016-08-17 NOTE — Progress Notes (Addendum)
ANTICOAGULATION CONSULT NOTE - Follow Up Consult  Pharmacy Consult for Heparin Indication: DVT and PE  Allergies  Allergen Reactions  . Hydrocodone Other (See Comments)    Did not help with pain and kept her up all night    Patient Measurements: Height: 5\' 3"  (160 cm) Weight: 191 lb 2.2 oz (86.7 kg) IBW/kg (Calculated) : 52.4 Heparin Dosing Weight:    Vital Signs: Temp: 98.5 F (36.9 C) (06/17 1607) Temp Source: Oral (06/17 1607) BP: 99/58 (06/17 1607) Pulse Rate: 74 (06/17 1607)  Labs:  Recent Labs  08/15/16 1204 08/15/16 1234 08/15/16 1857 08/16/16 0219  08/16/16 1507 08/16/16 2202 08/17/16 0615  HGB 10.9* 10.5*  --  10.0*  --   --   --  9.6*  HCT 33.0* 31.0*  --  30.9*  --   --   --  29.3*  PLT 200  --   --  199  --   --   --  188  HEPARINUNFRC  --   --   --  0.18*  < > 0.48 0.18* 0.58  CREATININE 0.74 0.70  --  0.79  --   --   --  0.80  TROPONINI  --   --  <0.03 <0.03  --   --   --   --   < > = values in this interval not displayed.  Estimated Creatinine Clearance: 83.9 mL/min (by C-G formula based on SCr of 0.8 mg/dL).  Assessment:  89 YOF with PE and DVT with history of breast cancer (s/p lumpectomy and chemo) on IV heparin.   Heparin level has been therapeutic. Plans to transition to Xarelto on 6/18. No bleeding noted.   Goal of Therapy:  Heparin level 0.3-0.7 units/ml Monitor platelets by anticoagulation protocol: Yes   Plan:  -Continue Heparin drip at 1500 units / hr until 6/18 at 0800 -Start Xarelto 15mg  PO BIDwc 6/18- this dose continues for a full 21 days (last dose 7/8 PM), then starts 20mg  qsupper on 7/9 -Follow for s/s bleeding -Education prior to discharge  Katera Rybka D. Jaclynne Baldo, PharmD, BCPS Clinical Pharmacist 08/17/2016 4:25 PM

## 2016-08-17 NOTE — Progress Notes (Signed)
  Echocardiogram 2D Echocardiogram has been performed.  Bobbye Charleston 08/17/2016, 10:11 AM

## 2016-08-18 ENCOUNTER — Encounter (HOSPITAL_COMMUNITY): Payer: Self-pay | Admitting: *Deleted

## 2016-08-18 DIAGNOSIS — C50412 Malignant neoplasm of upper-outer quadrant of left female breast: Secondary | ICD-10-CM

## 2016-08-18 DIAGNOSIS — I2602 Saddle embolus of pulmonary artery with acute cor pulmonale: Secondary | ICD-10-CM

## 2016-08-18 DIAGNOSIS — Z171 Estrogen receptor negative status [ER-]: Secondary | ICD-10-CM

## 2016-08-18 LAB — CBC
HCT: 25.7 % — ABNORMAL LOW (ref 36.0–46.0)
Hemoglobin: 8.5 g/dL — ABNORMAL LOW (ref 12.0–15.0)
MCH: 30.4 pg (ref 26.0–34.0)
MCHC: 33.1 g/dL (ref 30.0–36.0)
MCV: 91.8 fL (ref 78.0–100.0)
Platelets: 203 10*3/uL (ref 150–400)
RBC: 2.8 MIL/uL — ABNORMAL LOW (ref 3.87–5.11)
RDW: 12.5 % (ref 11.5–15.5)
WBC: 5.3 10*3/uL (ref 4.0–10.5)

## 2016-08-18 LAB — VANCOMYCIN, RANDOM: Vancomycin Rm: 19

## 2016-08-18 LAB — HEPARIN LEVEL (UNFRACTIONATED): Heparin Unfractionated: 0.33 IU/mL (ref 0.30–0.70)

## 2016-08-18 MED ORDER — RIVAROXABAN 20 MG PO TABS: 20.0000 mg | ORAL_TABLET | Freq: Every day | ORAL | 0 refills | Status: DC

## 2016-08-18 MED ORDER — RIVAROXABAN 15 MG PO TABS: 15.0000 mg | ORAL_TABLET | Freq: Two times a day (BID) | ORAL | 0 refills | Status: DC

## 2016-08-18 NOTE — Discharge Summary (Signed)
DISCHARGE SUMMARY  CLYTIE SHETLEY  MR#: [id number]  DOB:Oct 19, 1960  Date of Admission: 08/15/2016 Date of Discharge: 08/18/2016  Attending Physician:Malynn Lucy T  Patient's YWV:PXTG, Elaina Hoops, MD  Consults:  Oncology Plastics   Disposition: D/C home   Follow-up Appts: Follow-up Information    Irene Limbo, MD Follow up on 09/04/2016.   Specialty:  Plastic Surgery Why:  1030 am Contact information: [address] [place] [address] [phone]        Maisie Fus, MD. Schedule an appointment as soon as possible for a visit in 1 week(s).   Specialty:  Obstetrics and Gynecology Contact information: [address] [place] [address] [phone]          Tests Needing Follow-up: -routine monitoring of pt newly started on NOAC  Discharge Diagnoses: Acute pulmonary embolus Acute L LE DVT  Left Breast cancer  Initial presentation: 56 year old F Hx stage IIB triple negative left breast cancer S/P L breast lumpectomy and sentinel lymph node biopsy on 07/31/2016, and a bilateral breast reduction on 08/01/2016 followed by completion of neoadjuvant chemotherapy.  She was sent to the ED for evaluation of shortness of breath and left leg pain.  Hospital Course:  Acute pulmonary embolus/Acute LLE DVT  -CT evidence of right heart strain -Given recent surgery not a candidate for thrombolytics - PCCM consulted 6/15 -heparin drip transitioned to Xarelto on 6/18 - to complete 6 months of anticoag per Oncology  -Echocardiogram no evidence of heart strain  -Venous doppler shows left lower extremity DVT  Left Breast cancer -Status post neoadjuvant chemotherapy -Platelets stable and hemoglobin 10.5 -to f/u w/ Oncology and Plastics after d/c - was seen by both during hospitalization    Allergies as of 08/18/2016      Reactions   Hydrocodone Other (See Comments)   Did not help with pain and kept her up all night        Medication List    STOP taking these medications   oxyCODONE-acetaminophen 10-325 MG tablet Commonly known as:  PERCOCET     TAKE these medications   oxyCODONE 5 MG immediate release tablet Commonly known as:  Oxy IR/ROXICODONE Take 1-2 tablets (5-10 mg total) by mouth every 4 (four) hours as needed (for pain score of 1-4).   riTUXimab 100 MG/10ML injection Commonly known as:  RITUXAN Inject into the vein every 4 (four) months.   Rivaroxaban 15 MG Tabs tablet Commonly known as:  XARELTO Take 1 tablet (15 mg total) by mouth 2 (two) times daily with a meal. Start taking on:  08/19/2016   rivaroxaban 20 MG Tabs tablet Commonly known as:  XARELTO Take 1 tablet (20 mg total) by mouth daily with supper. Start taking on:  09/08/2016   VITAMIN D-1000 MAX ST 1000 units tablet Generic drug:  Cholecalciferol Take 2 tablets by mouth daily.       Day of Discharge BP (!) 100/58 (BP Location: Right Arm)   Pulse 86   Temp 98.7 F (37.1 C) (Oral)   Resp 14   Ht 5\' 3"  (1.6 m)   Wt 86.7 kg (191 lb 2.2 oz)   SpO2 96%   BMI 33.86 kg/m   Physical Exam: General: No acute respiratory distress Lungs: Clear to auscultation bilaterally without wheezes or crackles Cardiovascular: Regular rate and rhythm without murmur gallop or rub normal S1 and S2 Abdomen: Nontender, nondistended, soft, bowel sounds positive, no rebound, no ascites, no appreciable mass Extremities: No significant cyanosis,  clubbing, or edema bilateral lower extremities  Basic Metabolic Panel:  Recent Labs Lab 08/15/16 1204 08/15/16 1234 08/15/16 1857 08/16/16 0219 08/17/16 0615  NA 134* 137  --  132* 133*  K 3.9 3.9  --  3.6 3.6  CL 104 102  --  102 99*  CO2 21*  --   --  21* 25  GLUCOSE 114* 111*  --  119* 148*  BUN 10 11  --  9 7  CREATININE 0.74 0.70  --  0.79 0.80  CALCIUM 9.2  --   --  8.9 9.0  MG  --   --  1.8  --   --     Liver Function Tests:  Recent Labs Lab 08/15/16 1204 08/16/16 0219  08/17/16 0615  AST 38 28 17  ALT 41 32 25  ALKPHOS 182* 159* 135*  BILITOT 1.1 1.3* 0.9  PROT 6.6 6.1* 5.8*  ALBUMIN 3.8 3.3* 3.0*   CBC:  Recent Labs Lab 08/15/16 1204 08/15/16 1234 08/16/16 0219 08/17/16 0615 08/18/16 0235  WBC 9.1  --  6.8 5.4 5.3  NEUTROABS 7.8*  --   --   --   --   HGB 10.9* 10.5* 10.0* 9.6* 8.5*  HCT 33.0* 31.0* 30.9* 29.3* 25.7*  MCV 93.5  --  93.6 93.0 91.8  PLT 200  --  199 188 203    Cardiac Enzymes:  Recent Labs Lab 08/15/16 1857 08/16/16 0219  TROPONINI <0.03 <0.03    Recent Results (from the past 240 hour(s))  Blood culture (routine x 2)     Status: None (Preliminary result)   Collection Time: 08/15/16 12:00 PM  Result Value Ref Range Status   Specimen Description BLOOD RIGHT ANTECUBITAL  Final   Special Requests   Final    BOTTLES DRAWN AEROBIC AND ANAEROBIC Blood Culture adequate volume   Culture NO GROWTH 2 DAYS  Final   Report Status PENDING  Incomplete  Blood culture (routine x 2)     Status: None (Preliminary result)   Collection Time: 08/15/16 12:23 PM  Result Value Ref Range Status   Specimen Description BLOOD RIGHT FOREARM  Final   Special Requests   Final    BOTTLES DRAWN AEROBIC AND ANAEROBIC Blood Culture adequate volume   Culture NO GROWTH 2 DAYS  Final   Report Status PENDING  Incomplete  MRSA PCR Screening     Status: None   Collection Time: 08/15/16  5:41 PM  Result Value Ref Range Status   MRSA by PCR NEGATIVE NEGATIVE Final    Comment:        The GeneXpert MRSA Assay (FDA approved for NASAL specimens only), is one component of a comprehensive MRSA colonization surveillance program. It is not intended to diagnose MRSA infection nor to guide or monitor treatment for MRSA infections.      Time spent in discharge (includes decision making & examination of pt): 35 minutes  08/18/2016, 3:16 PM   Cherene Altes, MD [facility] Office  [phone] Pager [phone]  On-Call/Text Page:       [email or web address]      password [facility]

## 2016-08-18 NOTE — Care Management Note (Signed)
Case Management Note  Patient Details  Name: Grace Moyer MRN: [id number] Date of Birth: 09-Oct-1960  Subjective/Objective:      Pt admitted with PE              Action/Plan:   PTA independent from home with husband.  Pt is currently undergoing chemotherapy for Breast CA.  Pt will discharge home on Xarelto.  CM will submit benefit check and provide pt with both free 30 day card and reduced copay card.  CM will continue to follow for discharge needs   Expected Discharge Date:                  Expected Discharge Plan:  Home/Self Care  In-House Referral:     Discharge planning Services  CM Consult, Medication Assistance  Post Acute Care Choice:    Choice offered to:     DME Arranged:    DME Agency:     HH Arranged:    HH Agency:     Status of Service:     If discussed at [employer], dates discussed:    Additional Comments: 08/18/2016 CM provided both cards.  CM contacted pharmacy of choice [facility] in [place] and confirmed pharmacy can fill prescription.   Maryclare Labrador, RN 08/18/2016, 11:45 AM

## 2016-08-18 NOTE — Progress Notes (Signed)
   HD#3 left DVT, bilateral PE S/p lumpectomy and oncoplastic reduction  Pt alert, no difficulty breathing, notes pain still right chest and back Wants to go to [facility]  Exam breasts deferred, assisted to bedside commode   A/P Per chart has discussed Coumadin with Dr. Burr Medico; written for Xarelto today and plan to stop heparing gtt  Patient states she is "ready to go home." Reviewed may be able to start XRT as scheduled in few weeks.  Patient has scheduled f/u with me.   Irene Limbo, MD [facility] [phone], pin [phone]

## 2016-08-18 NOTE — Discharge Instructions (Addendum)
Pulmonary Embolism  A pulmonary embolism (PE) is a sudden blockage or decrease of blood flow in one lung or both lungs. Most blockages come from a blood clot that travels from the legs or the pelvis to the lungs. PE is a dangerous and potentially life-threatening condition if it is not treated right away. What are the causes? A pulmonary embolism occurs most commonly when a blood clot travels from one of your veins to your lungs. Rarely, PE is caused by air, fat, amniotic fluid, or part of a tumor traveling through your veins to your lungs. What increases the risk? A PE is more likely to develop in:  People who smoke.  People who areolder, especially over 50 years of age.  People who are overweight (obese).  People who sit or lie still for a long time, such as during long-distance travel (over 4 hours), bed rest, hospitalization, or during recovery from certain medical conditions like a stroke.  People who do not engage in much physical activity (sedentary lifestyle).  People who have chronic breathing disorders.  People whohave a personal or family history of blood clots or blood clotting disease.  People whohave peripheral vascular disease (PVD), diabetes, or some types of cancer.  People who haveheart disease, especially if the person had a recent heart attack or has congestive heart failure.  People who have neurological diseases that affect the legs (leg paresis).  People who have had a traumatic injury, such as breaking a hip or leg.  People whohave recently had major or lengthy surgery, especially on the hip, knee, or abdomen.  People who have hada central line placed inside a large vein.  People who takemedicines that contain the hormone estrogen. These include birth control pills and hormone replacement therapy.  Pregnancy or during childbirth or the postpartum period.  What are the signs or symptoms? The symptoms of a PE usually start suddenly and  include:  Shortness of breath while active or at rest.  Coughing or coughing up blood or blood-tinged mucus.  Chest pain that is often worse with deep breaths.  Rapid or irregular heartbeat.  Feeling light-headed or dizzy.  Fainting.  Feelinganxious.  Sweating.  There may also be pain and swelling in a leg if that is where the blood clot started. These symptoms may represent a serious problem that is an emergency. Do not wait to see if the symptoms will go away. Get medical help right away. Call your local emergency services (911 in the [place] Do not drive yourself to the hospital. How is this diagnosed? Your health care provider will take a medical history and perform a physical exam. You may also have other tests, including:  Blood tests to assess the clotting properties of your blood, assess oxygen levels in your blood, and find blood clots.  Imaging tests, such as CT, ultrasound, MRI, X-ray, and other tests to see if you have clots anywhere in your body.  An electrocardiogram (ECG) to look for heart strain from blood clots in the lungs.  How is this treated? The main goals of PE treatment are:  To stop a blood clot from growing larger.  To stop new blood clots from forming.  The type of treatment that you receive depends on many factors, such as the cause of your PE, your risk for bleeding or developing more clots, and other medical conditions that you have. Sometimes, a combination of treatments is necessary. This condition may be treated with:  Medicines, including newer oral blood  thinners (anticoagulants), warfarin, low molecular weight heparins, thrombolytics, or heparins.  Wearing compression stockings or using different types of devices.  Surgery (rare) to remove the blood clot or to place a filter in your abdomen to stop the blood clot from traveling to your lungs.  Treatments for a PE are often divided into immediate treatment, long-term treatment (up to 3  months after PE), and extended treatment (more than 3 months after PE). Your treatment may continue for several months. This is called maintenance therapy, and it is used to prevent the forming of new blood clots. You can work with your health care provider to choose the treatment program that is best for you. What are anticoagulants? Anticoagulants are medicines that treat PEs. They can stop current blood clots from growing and stop new clots from forming. They cannot dissolve existing clots. Your body dissolves clots by itself over time. Anticoagulants are given by mouth, by injection, or through an IV tube. What are thrombolytics? Thrombolytics are clot-dissolving medicines that are used to dissolve a PE. They carry a high risk of bleeding, so they tend to be used only in severe cases or if you have very low blood pressure. Follow these instructions at home: If you are taking a newer oral anticoagulant:  Take the medicine every single day at the same time each day.  Understand what foods and drugs interact with this medicine.  Understand that there are no regular blood tests required when using this medicine.  Understandthe side effects of this medicine, including excessive bruising or bleeding. Ask your health care provider or pharmacist about other possible side effects. If you are taking warfarin:  Understand how to take warfarin and know which foods can affect how warfarin works in Veterinary surgeon.  Understand that it is dangerous to taketoo much or too little warfarin. Too much warfarin increases the risk of bleeding. Too little warfarin continues to allow the risk for blood clots.  Follow your PT and INR blood testing schedule. The PT and INR results allow your health care provider to adjust your dose of warfarin. It is very important that you have your PT and INR tested as often as told by your health care provider.  Avoid major changes in your diet, or tell your health care provider  before you change your diet. Arrange a visit with a registered dietitian to answer your questions. Many foods, especially foods that are high in vitamin K, can interfere with warfarin and affect the PT and INR results. Eat a consistent amount of foods that are high in vitamin K, such as: ? Spinach, kale, broccoli, cabbage, collard greens, turnip greens, Brussels sprouts, peas, cauliflower, seaweed, and parsley. ? Beef liver and pork liver. ? Green tea. ? Soybean oil.  Tell your health care provider about any and all medicines, vitamins, and supplements that you take, including aspirin and other over-the-counter anti-inflammatory medicines. Be especially cautious with aspirin and anti-inflammatory medicines. Do not take those before you ask your health care provider if it is safe to do so. This is important because many medicines can interfere with warfarin and affect the PT and INR results.  Do not start or stop taking any over-the-counter or prescription medicine unless your health care provider or pharmacist tells you to do so. If you take warfarin, you will also need to do these things:  Hold pressure over cuts for longer than usual.  Tell your dentist and other health care providers that you are taking warfarin before you  have any procedures in which bleeding may occur.  Avoid alcohol or drink very small amounts. Tell your health care provider if you change your alcohol intake.  Do not use tobacco products, including cigarettes, chewing tobacco, and e-cigarettes. If you need help quitting, ask your health care provider.  Avoid contact sports.  General instructions  Take over-the-counter and prescription medicines only as told by your health care provider. Anticoagulant medicines can have side effects, including easy bruising and difficulty stopping bleeding. If you are prescribed an anticoagulant, you will also need to do these things: ? Hold pressure over cuts for longer than  usual. ? Tell your dentist and other health care providers that you are taking anticoagulants before you have any procedures in which bleeding may occur. ? Avoid contact sports.  Wear a medical alert bracelet or carry a medical alert card that says you have had a PE.  Ask your health care provider how soon you can go back to your normal activities. Stay active to prevent new blood clots from forming.  Make sure to exercise while traveling or when you have been sitting or standing for a long period of time. It is very important to exercise. Exercise your legs by walking or by tightening and relaxing your leg muscles often. Take frequent walks.  Wear compression stockings as told by your health care provider to help prevent more blood clots from forming.  Do not use tobacco products, including cigarettes, chewing tobacco, and e-cigarettes. If you need help quitting, ask your health care provider.  Keep all follow-up appointments with your health care provider. This is important. How is this prevented? Take these actions to decrease your risk of developing another PE:  Exercise regularly. For at least 30 minutes every day, engage in: ? Activity that involves moving your arms and legs. ? Activity that encourages good blood flow through your body by increasing your heart rate.  Exercise your arms and legs every hour during long-distance travel (over 4 hours). Drink plenty of water and avoid drinking alcohol while traveling.  Avoid sitting or lying in bed for long periods of time without moving your legs.  Maintain a weight that is appropriate for your height. Ask your health care provider what weight is healthy for you.  If you are a woman who is over 28 years of age, avoid unnecessary use of medicines that contain estrogen. These include birth control pills.  Do not smoke, especially if you take estrogen medicines. If you need help quitting, ask your health care provider.  If you are at  very high risk for PE, wear compression stockings.  If you recently had a PE, have regularly scheduled ultrasound testing on your legs to check for new blood clots.  If you are hospitalized, prevention measures may include:  Early walking after surgery, as soon as your health care provider says that it is safe.  Receiving anticoagulants to prevent blood clots. If you cannot take anticoagulants, other options may be available, such as wearing compression stockings or using different types of devices.  Get help right away if:  You have new or increased pain, swelling, or redness in an arm or leg.  You have numbness or tingling in an arm or leg.  You have shortness of breath while active or at rest.  You have chest pain.  You have a rapid or irregular heartbeat.  You feel light-headed or dizzy.  You cough up blood.  You notice blood in your vomit, bowel movement,  or urine.  You have a fever. These symptoms may represent a serious problem that is an emergency. Do not wait to see if the symptoms will go away. Get medical help right away. Call your local emergency services (911 in the [place] Do not drive yourself to the hospital. This information is not intended to replace advice given to you by your health care provider. Make sure you discuss any questions you have with your health care provider. Document Released: 02/15/2000 Document Revised: 07/26/2015 Document Reviewed: 06/14/2014 Elsevier Interactive Patient Education  2017 [place] on my medicine - XARELTO (rivaroxaban)  This medication education was reviewed with me or my healthcare representative as part of my discharge preparation.  WHY WAS XARELTO PRESCRIBED FOR YOU? Xarelto was prescribed to treat blood clots that may have been found in the veins of your legs (deep vein thrombosis) or in your lungs (pulmonary embolism) and to reduce the risk of them occurring again.  What do you need to know  about Xarelto? The starting dose is one 15 mg tablet taken TWICE daily with food for the FIRST 21 DAYS then on (enter date)  09/08/2016  the dose is changed to one 20 mg tablet taken ONCE A DAY with your evening meal.  DO NOT stop taking Xarelto without talking to the health care provider who prescribed the medication.  Refill your prescription for 20 mg tablets before you run out.  After discharge, you should have regular check-up appointments with your healthcare provider that is prescribing your Xarelto.  In the future your dose may need to be changed if your kidney function changes by a significant amount.  What do you do if you miss a dose? If you are taking Xarelto TWICE DAILY and you miss a dose, take it as soon as you remember. You may take two 15 mg tablets (total 30 mg) at the same time then resume your regularly scheduled 15 mg twice daily the next day.  If you are taking Xarelto ONCE DAILY and you miss a dose, take it as soon as you remember on the same day then continue your regularly scheduled once daily regimen the next day. Do not take two doses of Xarelto at the same time.   Important Safety Information Xarelto is a blood thinner medicine that can cause bleeding. You should call your healthcare provider right away if you experience any of the following: ? Bleeding from an injury or your nose that does not stop. ? Unusual colored urine (red or dark brown) or unusual colored stools (red or black). ? Unusual bruising for unknown reasons. ? A serious fall or if you hit your head (even if there is no bleeding).  Some medicines may interact with Xarelto and might increase your risk of bleeding while on Xarelto. To help avoid this, consult your healthcare provider or pharmacist prior to using any new prescription or non-prescription medications, including herbals, vitamins, non-steroidal anti-inflammatory drugs (NSAIDs) and supplements.  This website has more information on  Xarelto: [email or web address]

## 2016-08-18 NOTE — Progress Notes (Signed)
Nutrition Brief Follow-Up Note  Pt screened for MST score. Noted RD attempted to see on 08/16/16, however, unable to complete full assessment.   Spoke with pt and family members at bedside. Pt reports good appetite currently. She consumes 2 meals per day (always skips breakfast, sandwich for lunch, and meat, starch, and vegetable for dinner). She reports that family members have been assisting with meal preparation. She does not like the hospital food, however, is consuming 50-75% of meals. Family has been also providing outside snacks for pt.   Pt reports she experienced a 10# wt loss when undergoing chemotherapy and breast reduction surgery. Noted pt experienced a 6.8% wt loss over the past 6 months, which is not significant for time frame. Pt expressed that she is not overly concerned with weight loss. She shares that she has not changed her eating habits since undergoing chemo, however, has been modifying beverages choices (consuming only gingerale and water, which may attribute to some weight loss).   Nutrition-Focused physical exam completed. Findings are no fat depletion, no muscle depletion, and no edema. Pt very active, reports she continued to work full time while undergoing cancer treatments.   Pt denies any questions or concerns regarding nutrition, however, expressed appreciation for visit.   Body mass index is 33.86 kg/m. Patient meets criteria for obesity, class I based on current BMI.   Current diet order is Heart Healthy, patient is consuming approximately 50-75% of meals at this time. Labs and medications reviewed.   No nutrition interventions warranted at this time. If nutrition issues arise, please consult RD.   Mustaf Antonacci A. Jimmye Norman, RD, LDN, CDE Pager: [phone] After hours Pager: [phone]

## 2016-08-18 NOTE — Progress Notes (Signed)
Discharge instructions given. Answered questions of pt and pt's husband. Pt verbalized understanding. IV's removed, tolerated well. Prescriptions sent with pt together with discharge instructions. No s/s of distress at this time.

## 2016-08-18 NOTE — Care Management Note (Signed)
Case Management Note  Patient Details  Name: [name] MRN: [id number] Date of Birth: [birth year]  Subjective/Objective:      Pt admitted with PE              Action/Plan:   PTA independent from home with husband.  Pt is currently undergoing chemotherapy for Breast CA.  Pt will discharge home on Xarelto.  CM will submit benefit check and provide pt with both free 30 day card and reduced copay card.  CM will continue to follow for discharge needs   Expected Discharge Date:                  Expected Discharge Plan:  Home/Self Care  In-House Referral:     Discharge planning Services  CM Consult, Medication Assistance  Post Acute Care Choice:    Choice offered to:     DME Arranged:    DME Agency:     HH Arranged:    HH Agency:     Status of Service:     If discussed at [employer], dates discussed:    Additional Comments:  Maryclare Labrador, RN 08/18/2016, 11:03 AM

## 2016-08-20 LAB — CULTURE, BLOOD (ROUTINE X 2)
Culture: NO GROWTH
Culture: NO GROWTH
Special Requests: ADEQUATE
Special Requests: ADEQUATE

## 2016-09-02 ENCOUNTER — Ambulatory Visit
Admission: RE | Admit: 2016-09-02 | Discharge: 2016-09-02 | Disposition: A | Discharge status: Home / Self Care | Payer: 59 | Source: Ambulatory Visit | Attending: Radiation Oncology | Admitting: Radiation Oncology

## 2016-09-02 DIAGNOSIS — Z170 Estrogen receptor positive status [ER+]: Secondary | ICD-10-CM

## 2016-09-02 DIAGNOSIS — Z885 Allergy status to narcotic agent status: Secondary | ICD-10-CM | POA: Diagnosis not present

## 2016-09-02 DIAGNOSIS — Z800 Family history of malignant neoplasm of digestive organs: Secondary | ICD-10-CM | POA: Diagnosis not present

## 2016-09-02 DIAGNOSIS — C50412 Malignant neoplasm of upper-outer quadrant of left female breast: Secondary | ICD-10-CM

## 2016-09-02 DIAGNOSIS — Z90710 Acquired absence of both cervix and uterus: Secondary | ICD-10-CM | POA: Diagnosis not present

## 2016-09-02 DIAGNOSIS — Z510 Encounter for antineoplastic radiation therapy: Secondary | ICD-10-CM | POA: Diagnosis present

## 2016-09-02 DIAGNOSIS — Z803 Family history of malignant neoplasm of breast: Secondary | ICD-10-CM | POA: Diagnosis not present

## 2016-09-02 DIAGNOSIS — Z8249 Family history of ischemic heart disease and other diseases of the circulatory system: Secondary | ICD-10-CM | POA: Diagnosis not present

## 2016-09-02 DIAGNOSIS — Z818 Family history of other mental and behavioral disorders: Secondary | ICD-10-CM | POA: Diagnosis not present

## 2016-09-02 DIAGNOSIS — Z820 Family history of epilepsy and other diseases of the nervous system: Secondary | ICD-10-CM | POA: Diagnosis not present

## 2016-09-02 DIAGNOSIS — Z825 Family history of asthma and other chronic lower respiratory diseases: Secondary | ICD-10-CM | POA: Diagnosis not present

## 2016-09-02 DIAGNOSIS — Z87891 Personal history of nicotine dependence: Secondary | ICD-10-CM | POA: Diagnosis not present

## 2016-09-02 DIAGNOSIS — Z9889 Other specified postprocedural states: Secondary | ICD-10-CM | POA: Diagnosis not present

## 2016-09-02 DIAGNOSIS — Z801 Family history of malignant neoplasm of trachea, bronchus and lung: Secondary | ICD-10-CM | POA: Diagnosis not present

## 2016-09-02 DIAGNOSIS — Z79899 Other long term (current) drug therapy: Secondary | ICD-10-CM | POA: Diagnosis not present

## 2016-09-04 NOTE — Progress Notes (Signed)
  Radiation Oncology         [phone] ________________________________  Name: Grace Moyer MRN: [id number]  Date: 09/02/2016  DOB: 07/07/1960  Optical Surface Tracking Plan:  Since intensity modulated radiotherapy (IMRT) and 3D conformal radiation treatment methods are predicated on accurate and precise positioning for treatment, intrafraction motion monitoring is medically necessary to ensure accurate and safe treatment delivery.  The ability to quantify intrafraction motion without excessive ionizing radiation dose can only be performed with optical surface tracking. Accordingly, surface imaging offers the opportunity to obtain 3D measurements of patient position throughout IMRT and 3D treatments without excessive radiation exposure.  I am ordering optical surface tracking for this patient's upcoming course of radiotherapy. ________________________________  Kyung Rudd, MD 09/04/2016 6:09 PM    Reference:   Particia Jasper, et al. Surface imaging-based analysis of intrafraction motion for breast radiotherapy patients.Journal of [place], n. 6, nov. 2014. ISSN [id number].   Available at: <http://www.jacmp.org/index.php/jacmp/article/view/4957>.

## 2016-09-04 NOTE — Progress Notes (Signed)
  Radiation Oncology         [phone] ________________________________  Name: Grace Moyer MRN: [id number]  Date: 09/02/2016  DOB: 19-Mar-1960  Diagnosis DIAGNOSIS:     ICD-10-CM   1. Malignant neoplasm of upper-outer quadrant of left breast in female, estrogen receptor positive ([place]) C50.412    Z17.0      SIMULATION AND TREATMENT PLANNING NOTE  The patient presented for simulation prior to beginning her course of radiation treatment for her diagnosis of left-sided breast cancer. The patient was placed in a supine position on a breast board. A customized vac-lock bag was also constructed and this complex treatment device will be used on a daily basis during her treatment. In this fashion, a CT scan was obtained through the chest area and an isocenter was placed near the chest wall at the upper aspect of the right chest. A breath-hold technique has also been evaluated to determine if this significantly improves the spatial relationship between the target region and the heart. Based on this analysis, a breath-hold technique has been ordered for the patient's treatment.  The patient will be planned to receive a course of radiation initially to a dose of 50.4 gray. This will consist of a 4 field technique targeting the left breast as well as the supraclavicular region. Therefore 2 customized medial and lateral tangent fields have been created targeting the chest wall, and also 2 additional customized fields have been designed to treat the supraclavicular region both with a left supraclavicular field and a left posterior axillary boost field. A forward planning/reduced field technique will also be evaluated to determine if this significantly improves the dose homogeneity of the overall plan. Therefore, additional customized blocks/fields may be necessary.  This initial treatment will be accomplished at 1.8 gray per fraction.   The initial plan will consist of a 3-D conformal technique. The target  volume/scar, heart and lungs have been contoured and dose volume histograms of each of these structures will be evaluated as part of the 3-D conformal treatment planning process.   It is anticipated that the patient will then receive a 10 gray boost to the surgical scar. This will be accomplished at 2 gray per fraction. The final anticipated total dose therefore will correspond to 60.4 gray.  Special treatment procedure was performed today due to the extra time and effort required by myself to plan and prepare this patient for deep inspiration breath hold technique.  I have determined cardiac sparing to be of benefit to this patient to prevent long term cardiac damage due to radiation of the heart.  Bellows were placed on the patient's abdomen. To facilitate cardiac sparing, the patient was coached by the radiation therapists on breath hold techniques and breathing practice was performed. Practice waveforms were obtained. The patient was then scanned while maintaining breath hold in the treatment position.  This image was then transferred over to the imaging specialist. The imaging specialist then created a fusion of the free breathing and breath hold scans using the chest wall as the stable structure. I personally reviewed the fusion in axial, coronal and sagittal image planes.  Excellent cardiac sparing was obtained.  I felt the patient is an appropriate candidate for breath hold and the patient will be treated as such.  The image fusion was then reviewed with the patient to reinforce the necessity of reproducible breath hold.      _______________________________   Jodelle Gross, MD, PhD

## 2016-09-07 ENCOUNTER — Telehealth: Payer: Self-pay | Admitting: Hematology

## 2016-09-07 NOTE — Telephone Encounter (Signed)
Lvm advising appt 7/30 @ 2.45.

## 2016-09-09 ENCOUNTER — Other Ambulatory Visit: Payer: Self-pay | Admitting: Nurse Practitioner

## 2016-09-09 ENCOUNTER — Other Ambulatory Visit (HOSPITAL_BASED_OUTPATIENT_CLINIC_OR_DEPARTMENT_OTHER): Payer: 59

## 2016-09-09 ENCOUNTER — Telehealth: Payer: Self-pay | Admitting: *Deleted

## 2016-09-09 ENCOUNTER — Ambulatory Visit (HOSPITAL_BASED_OUTPATIENT_CLINIC_OR_DEPARTMENT_OTHER): Payer: 59 | Admitting: Nurse Practitioner

## 2016-09-09 ENCOUNTER — Telehealth: Payer: Self-pay | Admitting: Nurse Practitioner

## 2016-09-09 VITALS — BP 143/76 | HR 77 | Temp 98.5°F | Resp 18 | Ht 63.0 in | Wt 186.8 lb

## 2016-09-09 DIAGNOSIS — C50412 Malignant neoplasm of upper-outer quadrant of left female breast: Secondary | ICD-10-CM | POA: Diagnosis not present

## 2016-09-09 DIAGNOSIS — Z7901 Long term (current) use of anticoagulants: Secondary | ICD-10-CM

## 2016-09-09 DIAGNOSIS — R195 Other fecal abnormalities: Secondary | ICD-10-CM

## 2016-09-09 DIAGNOSIS — K921 Melena: Secondary | ICD-10-CM

## 2016-09-09 DIAGNOSIS — I2699 Other pulmonary embolism without acute cor pulmonale: Secondary | ICD-10-CM

## 2016-09-09 DIAGNOSIS — Z171 Estrogen receptor negative status [ER-]: Secondary | ICD-10-CM | POA: Diagnosis not present

## 2016-09-09 DIAGNOSIS — I82402 Acute embolism and thrombosis of unspecified deep veins of left lower extremity: Secondary | ICD-10-CM

## 2016-09-09 DIAGNOSIS — Z510 Encounter for antineoplastic radiation therapy: Secondary | ICD-10-CM | POA: Diagnosis not present

## 2016-09-09 DIAGNOSIS — D649 Anemia, unspecified: Secondary | ICD-10-CM

## 2016-09-09 DIAGNOSIS — D6481 Anemia due to antineoplastic chemotherapy: Secondary | ICD-10-CM | POA: Diagnosis not present

## 2016-09-09 LAB — CBC WITH DIFFERENTIAL/PLATELET
BASO%: 0.5 % (ref 0.0–2.0)
Basophils Absolute: 0 10*3/uL (ref 0.0–0.1)
EOS%: 4.6 % (ref 0.0–7.0)
Eosinophils Absolute: 0.2 10*3/uL (ref 0.0–0.5)
HCT: 32.9 % — ABNORMAL LOW (ref 34.8–46.6)
HGB: 10.8 g/dL — ABNORMAL LOW (ref 11.6–15.9)
LYMPH%: 26.3 % (ref 14.0–49.7)
MCH: 29.2 pg (ref 25.1–34.0)
MCHC: 32.8 g/dL (ref 31.5–36.0)
MCV: 88.9 fL (ref 79.5–101.0)
MONO#: 0.3 10*3/uL (ref 0.1–0.9)
MONO%: 8.5 % (ref 0.0–14.0)
NEUT#: 2.3 10*3/uL (ref 1.5–6.5)
NEUT%: 60.1 % (ref 38.4–76.8)
Platelets: 150 10*3/uL (ref 145–400)
RBC: 3.7 10*6/uL (ref 3.70–5.45)
RDW: 13.6 % (ref 11.2–14.5)
WBC: 3.9 10*3/uL (ref 3.9–10.3)
lymph#: 1 10*3/uL (ref 0.9–3.3)

## 2016-09-09 NOTE — Telephone Encounter (Signed)
Pt called with c/o "tar looking" stools x 2 days. Consistency is normal and formed. Pt relate "I feel great".  Discussed pt complaint with Ned Card, NP and Dr. Burr Medico.  Scheduled and confirmed appt for labs at 2pm today.   Informed pt to wait after labs drawn to discuss results and plans based on the results. Received verbal understanding.

## 2016-09-09 NOTE — Telephone Encounter (Signed)
Scheduled appt per 7/10 los - Gave patient AVS and calender per los.  

## 2016-09-09 NOTE — Progress Notes (Signed)
  [facility] OFFICE PROGRESS NOTE   Diagnosis:  Triple negative left breast cancer, recent acute left lower extremity DVT and acute pulmonary embolus  INTERVAL HISTORY:   [name] returns prior to scheduled follow-up for evaluation of black stool. She began having black stools 3 days ago. She denies diarrhea but has noted more frequent bowel movements. She is not taking oral iron. She initially thought the stool color is related to her diet but discounted this when the black stools persisted. No bright red blood. No maroon stools. She states that she feels "fine". No abdominal pain. No fever. Shortness of breath is better. No chest pain. Left leg swelling is better. She began Xarelto at the once daily dose yesterday.  Objective:  Vital signs in last 24 hours:  Blood pressure (!) 143/76, pulse 77, temperature 98.5 F (36.9 C), temperature source Oral, resp. rate 18, height 5' 3" (1.6 m), weight 186 lb 12.8 oz (84.7 kg), SpO2 100 %.    HEENT: No thrush or ulcers. Resp: Lungs clear bilaterally. Cardio: Regular rate and rhythm. GI: Abdomen soft and nontender. No hepatomegaly. Vascular: Pitting edema at the left lower leg. Rectal: Soft, dark brown stool on glove. Stool tested Hemoccult negative.   Lab Results:  Lab Results  Component Value Date   WBC 3.9 09/09/2016   HGB 10.8 (L) 09/09/2016   HCT 32.9 (L) 09/09/2016   MCV 88.9 09/09/2016   PLT 150 09/09/2016   NEUTROABS 2.3 09/09/2016    Imaging:  No results found.  Medications: I have reviewed the patient's current medications.  Assessment/Plan: 1. Malignant neoplasm of upper-outer quadrant of left breast, invasive ductal carcinoma, G3, cT2N1M0, stage IIB, ER-/PR-/HER2-; status post neoadjuvant chemotherapy with Adriamycin/Cytoxan 4 02/13/2016 through 03/26/2016 followed by 12 cycles of weekly carboplatin/Taxol completed 07/02/2016; follow-up breast MRI 07/11/2016 with previously noted enhancing mass not  seen. Status post lumpectomy and sentinel lymph node biopsy 07/31/2016 with a complete pathologic response. 2. Hospitalized 08/15/2016 with shortness of breath. CT chest highly suspicious for bilateral lower lobe segmental pulmonary emboli and developing small pulmonary infarcts. Trace right pleural effusion. Possible right heart strain. Found to have acute left lower extremity DVT. Started on a heparin drip. Discharged on Xarelto. 3. "Black stools". Hemoccult negative 09/09/2016. 4. Anemia. Hemoglobin improved as compared to hospital discharge 08/18/2016.   Disposition: [name] appears stable. Her hemoglobin is better as compared to hospital discharge 08/18/2016 and the stool tested Hemoccult negative. She will to continue to monitor her stools. She will continue Xarelto as she is currently taking. She understands to contact the office with further black stools or other signs of bleeding. We will repeat a CBC on 09/12/2016.  Plan reviewed with Dr. Burr Medico.     Ned Card ANP/GNP-BC   09/09/2016  3:24 PM

## 2016-09-10 ENCOUNTER — Ambulatory Visit
Admission: RE | Admit: 2016-09-10 | Discharge: 2016-09-10 | Disposition: A | Discharge status: Home / Self Care | Payer: 59 | Source: Ambulatory Visit | Attending: Radiation Oncology | Admitting: Radiation Oncology

## 2016-09-10 DIAGNOSIS — C50412 Malignant neoplasm of upper-outer quadrant of left female breast: Secondary | ICD-10-CM

## 2016-09-10 DIAGNOSIS — Z510 Encounter for antineoplastic radiation therapy: Secondary | ICD-10-CM | POA: Diagnosis not present

## 2016-09-10 MED ORDER — ALRA NON-METALLIC DEODORANT (RAD-ONC)
1.0000 "application " | Freq: Once | TOPICAL | Status: AC
  Administered 2016-09-10: 1 via TOPICAL

## 2016-09-10 MED ORDER — RADIAPLEXRX EX GEL
Freq: Once | CUTANEOUS | Status: AC
  Administered 2016-09-10: 17:00:00 via TOPICAL

## 2016-09-10 NOTE — Progress Notes (Signed)
Pt eduction done, my business card, alra deodorant,radiaplex gel given along with Radiation therapy and you book, discussed ways to manage side effects ,skin irritation, swelling breast,sharp short pains in breast, fatigue, increase protein in diet, stay hydrated, drink plenty water, luke warm shower or baths, use unscented dove , pat dry,no rubbing,scrubbing or scratching treated area, electric shaver only, wear sunscreen, moisturize breast 2x day, once after rad tx in afternoon, then again in am after shower, nothing 4 hours prior to rad txs, alra after rad tx and prn, sees MD weekly and prn, teach back given .nbow

## 2016-09-11 ENCOUNTER — Ambulatory Visit
Admission: RE | Admit: 2016-09-11 | Discharge: 2016-09-11 | Disposition: A | Discharge status: Home / Self Care | Payer: 59 | Source: Ambulatory Visit | Attending: Radiation Oncology | Admitting: Radiation Oncology

## 2016-09-11 DIAGNOSIS — C50412 Malignant neoplasm of upper-outer quadrant of left female breast: Secondary | ICD-10-CM | POA: Diagnosis not present

## 2016-09-11 DIAGNOSIS — Z510 Encounter for antineoplastic radiation therapy: Secondary | ICD-10-CM | POA: Diagnosis not present

## 2016-09-12 ENCOUNTER — Other Ambulatory Visit (HOSPITAL_BASED_OUTPATIENT_CLINIC_OR_DEPARTMENT_OTHER): Payer: 59

## 2016-09-12 ENCOUNTER — Ambulatory Visit
Admission: RE | Admit: 2016-09-12 | Discharge: 2016-09-12 | Disposition: A | Discharge status: Home / Self Care | Payer: 59 | Source: Ambulatory Visit | Attending: Radiation Oncology | Admitting: Radiation Oncology

## 2016-09-12 DIAGNOSIS — C50412 Malignant neoplasm of upper-outer quadrant of left female breast: Secondary | ICD-10-CM

## 2016-09-12 DIAGNOSIS — D649 Anemia, unspecified: Secondary | ICD-10-CM

## 2016-09-12 DIAGNOSIS — Z510 Encounter for antineoplastic radiation therapy: Secondary | ICD-10-CM | POA: Diagnosis not present

## 2016-09-12 LAB — CBC WITH DIFFERENTIAL/PLATELET
BASO%: 0.8 % (ref 0.0–2.0)
Basophils Absolute: 0 10*3/uL (ref 0.0–0.1)
EOS%: 4.3 % (ref 0.0–7.0)
Eosinophils Absolute: 0.2 10*3/uL (ref 0.0–0.5)
HCT: 31.9 % — ABNORMAL LOW (ref 34.8–46.6)
HGB: 10.5 g/dL — ABNORMAL LOW (ref 11.6–15.9)
LYMPH%: 26.4 % (ref 14.0–49.7)
MCH: 29.2 pg (ref 25.1–34.0)
MCHC: 32.9 g/dL (ref 31.5–36.0)
MCV: 88.6 fL (ref 79.5–101.0)
MONO#: 0.2 10*3/uL (ref 0.1–0.9)
MONO%: 6 % (ref 0.0–14.0)
NEUT#: 2.5 10*3/uL (ref 1.5–6.5)
NEUT%: 62.5 % (ref 38.4–76.8)
Platelets: 143 10*3/uL — ABNORMAL LOW (ref 145–400)
RBC: 3.6 10*6/uL — ABNORMAL LOW (ref 3.70–5.45)
RDW: 13.9 % (ref 11.2–14.5)
WBC: 4 10*3/uL (ref 3.9–10.3)
lymph#: 1.1 10*3/uL (ref 0.9–3.3)
nRBC: 0 % (ref 0–0)

## 2016-09-15 ENCOUNTER — Other Ambulatory Visit (HOSPITAL_BASED_OUTPATIENT_CLINIC_OR_DEPARTMENT_OTHER): Payer: 59

## 2016-09-15 ENCOUNTER — Ambulatory Visit
Admission: RE | Admit: 2016-09-15 | Discharge: 2016-09-15 | Disposition: A | Discharge status: Home / Self Care | Payer: 59 | Source: Ambulatory Visit | Attending: Radiation Oncology | Admitting: Radiation Oncology

## 2016-09-15 DIAGNOSIS — C50412 Malignant neoplasm of upper-outer quadrant of left female breast: Secondary | ICD-10-CM | POA: Diagnosis not present

## 2016-09-15 DIAGNOSIS — D649 Anemia, unspecified: Secondary | ICD-10-CM

## 2016-09-15 DIAGNOSIS — Z510 Encounter for antineoplastic radiation therapy: Secondary | ICD-10-CM | POA: Diagnosis not present

## 2016-09-15 LAB — CBC WITH DIFFERENTIAL/PLATELET
BASO%: 0.5 % (ref 0.0–2.0)
Basophils Absolute: 0 10*3/uL (ref 0.0–0.1)
EOS%: 3.5 % (ref 0.0–7.0)
Eosinophils Absolute: 0.1 10*3/uL (ref 0.0–0.5)
HCT: 33.9 % — ABNORMAL LOW (ref 34.8–46.6)
HGB: 11.1 g/dL — ABNORMAL LOW (ref 11.6–15.9)
LYMPH%: 24.8 % (ref 14.0–49.7)
MCH: 28.9 pg (ref 25.1–34.0)
MCHC: 32.7 g/dL (ref 31.5–36.0)
MCV: 88.3 fL (ref 79.5–101.0)
MONO#: 0.3 10*3/uL (ref 0.1–0.9)
MONO%: 8 % (ref 0.0–14.0)
NEUT#: 2.5 10*3/uL (ref 1.5–6.5)
NEUT%: 63.2 % (ref 38.4–76.8)
Platelets: 153 10*3/uL (ref 145–400)
RBC: 3.84 10*6/uL (ref 3.70–5.45)
RDW: 14.2 % (ref 11.2–14.5)
WBC: 4 10*3/uL (ref 3.9–10.3)
lymph#: 1 10*3/uL (ref 0.9–3.3)
nRBC: 0 % (ref 0–0)

## 2016-09-16 ENCOUNTER — Ambulatory Visit
Admission: RE | Admit: 2016-09-16 | Discharge: 2016-09-16 | Disposition: A | Discharge status: Home / Self Care | Payer: 59 | Source: Ambulatory Visit | Attending: Radiation Oncology | Admitting: Radiation Oncology

## 2016-09-16 DIAGNOSIS — Z510 Encounter for antineoplastic radiation therapy: Secondary | ICD-10-CM | POA: Diagnosis not present

## 2016-09-16 DIAGNOSIS — C50412 Malignant neoplasm of upper-outer quadrant of left female breast: Secondary | ICD-10-CM | POA: Diagnosis not present

## 2016-09-17 ENCOUNTER — Ambulatory Visit
Admission: RE | Admit: 2016-09-17 | Discharge: 2016-09-17 | Disposition: A | Discharge status: Home / Self Care | Payer: 59 | Source: Ambulatory Visit | Attending: Radiation Oncology | Admitting: Radiation Oncology

## 2016-09-17 DIAGNOSIS — Z510 Encounter for antineoplastic radiation therapy: Secondary | ICD-10-CM | POA: Diagnosis not present

## 2016-09-17 DIAGNOSIS — C50412 Malignant neoplasm of upper-outer quadrant of left female breast: Secondary | ICD-10-CM | POA: Diagnosis not present

## 2016-09-18 ENCOUNTER — Ambulatory Visit
Admission: RE | Admit: 2016-09-18 | Discharge: 2016-09-18 | Disposition: A | Discharge status: Home / Self Care | Payer: 59 | Source: Ambulatory Visit | Attending: Radiation Oncology | Admitting: Radiation Oncology

## 2016-09-18 DIAGNOSIS — Z510 Encounter for antineoplastic radiation therapy: Secondary | ICD-10-CM | POA: Diagnosis not present

## 2016-09-18 DIAGNOSIS — C50412 Malignant neoplasm of upper-outer quadrant of left female breast: Secondary | ICD-10-CM | POA: Diagnosis not present

## 2016-09-19 ENCOUNTER — Ambulatory Visit
Admission: RE | Admit: 2016-09-19 | Discharge: 2016-09-19 | Disposition: A | Discharge status: Home / Self Care | Payer: 59 | Source: Ambulatory Visit | Attending: Radiation Oncology | Admitting: Radiation Oncology

## 2016-09-19 DIAGNOSIS — Z510 Encounter for antineoplastic radiation therapy: Secondary | ICD-10-CM | POA: Diagnosis not present

## 2016-09-19 DIAGNOSIS — C50412 Malignant neoplasm of upper-outer quadrant of left female breast: Secondary | ICD-10-CM | POA: Diagnosis not present

## 2016-09-22 ENCOUNTER — Ambulatory Visit
Admission: RE | Admit: 2016-09-22 | Discharge: 2016-09-22 | Disposition: A | Discharge status: Home / Self Care | Payer: 59 | Source: Ambulatory Visit | Attending: Radiation Oncology | Admitting: Radiation Oncology

## 2016-09-22 DIAGNOSIS — C50412 Malignant neoplasm of upper-outer quadrant of left female breast: Secondary | ICD-10-CM | POA: Diagnosis not present

## 2016-09-22 DIAGNOSIS — Z510 Encounter for antineoplastic radiation therapy: Secondary | ICD-10-CM | POA: Diagnosis not present

## 2016-09-23 ENCOUNTER — Ambulatory Visit
Admission: RE | Admit: 2016-09-23 | Discharge: 2016-09-23 | Disposition: A | Discharge status: Home / Self Care | Payer: 59 | Source: Ambulatory Visit | Attending: Radiation Oncology | Admitting: Radiation Oncology

## 2016-09-23 DIAGNOSIS — Z510 Encounter for antineoplastic radiation therapy: Secondary | ICD-10-CM | POA: Diagnosis not present

## 2016-09-23 DIAGNOSIS — C50412 Malignant neoplasm of upper-outer quadrant of left female breast: Secondary | ICD-10-CM | POA: Diagnosis not present

## 2016-09-24 ENCOUNTER — Ambulatory Visit
Admission: RE | Admit: 2016-09-24 | Discharge: 2016-09-24 | Disposition: A | Discharge status: Home / Self Care | Payer: 59 | Source: Ambulatory Visit | Attending: Radiation Oncology | Admitting: Radiation Oncology

## 2016-09-24 DIAGNOSIS — Z510 Encounter for antineoplastic radiation therapy: Secondary | ICD-10-CM | POA: Diagnosis not present

## 2016-09-24 DIAGNOSIS — C50412 Malignant neoplasm of upper-outer quadrant of left female breast: Secondary | ICD-10-CM | POA: Diagnosis not present

## 2016-09-25 ENCOUNTER — Ambulatory Visit
Admission: RE | Admit: 2016-09-25 | Discharge: 2016-09-25 | Disposition: A | Discharge status: Home / Self Care | Payer: 59 | Source: Ambulatory Visit | Attending: Radiation Oncology | Admitting: Radiation Oncology

## 2016-09-25 DIAGNOSIS — C50412 Malignant neoplasm of upper-outer quadrant of left female breast: Secondary | ICD-10-CM | POA: Diagnosis not present

## 2016-09-25 DIAGNOSIS — Z510 Encounter for antineoplastic radiation therapy: Secondary | ICD-10-CM | POA: Diagnosis not present

## 2016-09-26 ENCOUNTER — Ambulatory Visit
Admission: RE | Admit: 2016-09-26 | Discharge: 2016-09-26 | Disposition: A | Discharge status: Home / Self Care | Payer: 59 | Source: Ambulatory Visit | Attending: Radiation Oncology | Admitting: Radiation Oncology

## 2016-09-26 DIAGNOSIS — Z510 Encounter for antineoplastic radiation therapy: Secondary | ICD-10-CM | POA: Diagnosis not present

## 2016-09-26 DIAGNOSIS — C50412 Malignant neoplasm of upper-outer quadrant of left female breast: Secondary | ICD-10-CM | POA: Diagnosis not present

## 2016-09-29 ENCOUNTER — Ambulatory Visit (HOSPITAL_BASED_OUTPATIENT_CLINIC_OR_DEPARTMENT_OTHER): Payer: 59 | Admitting: Hematology

## 2016-09-29 ENCOUNTER — Ambulatory Visit
Admission: RE | Admit: 2016-09-29 | Discharge: 2016-09-29 | Disposition: A | Discharge status: Home / Self Care | Payer: 59 | Source: Ambulatory Visit | Attending: Radiation Oncology | Admitting: Radiation Oncology

## 2016-09-29 ENCOUNTER — Other Ambulatory Visit (HOSPITAL_BASED_OUTPATIENT_CLINIC_OR_DEPARTMENT_OTHER): Payer: 59

## 2016-09-29 ENCOUNTER — Encounter: Payer: Self-pay | Admitting: Hematology

## 2016-09-29 DIAGNOSIS — Z7901 Long term (current) use of anticoagulants: Secondary | ICD-10-CM

## 2016-09-29 DIAGNOSIS — D6481 Anemia due to antineoplastic chemotherapy: Secondary | ICD-10-CM

## 2016-09-29 DIAGNOSIS — C50412 Malignant neoplasm of upper-outer quadrant of left female breast: Secondary | ICD-10-CM

## 2016-09-29 DIAGNOSIS — I82402 Acute embolism and thrombosis of unspecified deep veins of left lower extremity: Secondary | ICD-10-CM

## 2016-09-29 DIAGNOSIS — L723 Sebaceous cyst: Secondary | ICD-10-CM | POA: Diagnosis not present

## 2016-09-29 DIAGNOSIS — G629 Polyneuropathy, unspecified: Secondary | ICD-10-CM | POA: Diagnosis not present

## 2016-09-29 DIAGNOSIS — I2699 Other pulmonary embolism without acute cor pulmonale: Secondary | ICD-10-CM | POA: Diagnosis not present

## 2016-09-29 DIAGNOSIS — Z170 Estrogen receptor positive status [ER+]: Secondary | ICD-10-CM

## 2016-09-29 DIAGNOSIS — Z171 Estrogen receptor negative status [ER-]: Secondary | ICD-10-CM

## 2016-09-29 DIAGNOSIS — Z510 Encounter for antineoplastic radiation therapy: Secondary | ICD-10-CM | POA: Diagnosis not present

## 2016-09-29 DIAGNOSIS — G360 Neuromyelitis optica [Devic]: Secondary | ICD-10-CM | POA: Diagnosis not present

## 2016-09-29 LAB — COMPREHENSIVE METABOLIC PANEL
ALT: 65 U/L — ABNORMAL HIGH (ref 0–55)
AST: 44 U/L — ABNORMAL HIGH (ref 5–34)
Albumin: 4 g/dL (ref 3.5–5.0)
Alkaline Phosphatase: 116 U/L (ref 40–150)
Anion Gap: 7 mEq/L (ref 3–11)
BUN: 15 mg/dL (ref 7.0–26.0)
CO2: 26 mEq/L (ref 22–29)
Calcium: 9.7 mg/dL (ref 8.4–10.4)
Chloride: 107 mEq/L (ref 98–109)
Creatinine: 0.9 mg/dL (ref 0.6–1.1)
EGFR: 68 mL/min/{1.73_m2} — ABNORMAL LOW (ref >90)
Glucose: 90 mg/dl (ref 70–140)
Potassium: 4.1 mEq/L (ref 3.5–5.1)
Sodium: 141 mEq/L (ref 136–145)
Total Bilirubin: 0.47 mg/dL (ref 0.20–1.20)
Total Protein: 6.7 g/dL (ref 6.4–8.3)

## 2016-09-29 LAB — CBC WITH DIFFERENTIAL/PLATELET
BASO%: 0.5 % (ref 0.0–2.0)
Basophils Absolute: 0 10*3/uL (ref 0.0–0.1)
EOS%: 1.8 % (ref 0.0–7.0)
Eosinophils Absolute: 0.1 10*3/uL (ref 0.0–0.5)
HCT: 34.9 % (ref 34.8–46.6)
HGB: 11.7 g/dL (ref 11.6–15.9)
LYMPH%: 11.1 % — ABNORMAL LOW (ref 14.0–49.7)
MCH: 29.9 pg (ref 25.1–34.0)
MCHC: 33.5 g/dL (ref 31.5–36.0)
MCV: 89.1 fL (ref 79.5–101.0)
MONO#: 0.3 10*3/uL (ref 0.1–0.9)
MONO%: 7.3 % (ref 0.0–14.0)
NEUT#: 3.5 10*3/uL (ref 1.5–6.5)
NEUT%: 79.3 % — ABNORMAL HIGH (ref 38.4–76.8)
Platelets: 173 10*3/uL (ref 145–400)
RBC: 3.92 10*6/uL (ref 3.70–5.45)
RDW: 15.8 % — ABNORMAL HIGH (ref 11.2–14.5)
WBC: 4.4 10*3/uL (ref 3.9–10.3)
lymph#: 0.5 10*3/uL — ABNORMAL LOW (ref 0.9–3.3)

## 2016-09-29 NOTE — Progress Notes (Signed)
[place]  [phone] Fax:([phone]) [phone]  Clinic Follow up Note   Patient Care Team: Maisie Fus, MD as PCP - General (Obstetrics and Gynecology) Collene Gobble, MD as Attending Physician (Psychiatry) Rolm Bookbinder, MD as Consulting Physician (General Surgery) Truitt Merle, MD as Consulting Physician (Hematology) Kyung Rudd, MD as Consulting Physician (Radiation Oncology) 09/29/2016   CHIEF COMPLAINTS:  Follow up left breast triple negative cancer and PE   Oncology History   Malignant neoplasm of upper-outer quadrant of left female breast [facility])   Staging form: Breast, AJCC 7th Edition   - Clinical stage from 01/29/2016: Stage IIB (T2, N1, M0) - Signed by Truitt Merle, MD on 02/06/2016      Malignant neoplasm of upper-outer quadrant of left female breast ([place])   01/28/2016 Mammogram    Diagnostic MM and US showed a 2.9cm (3.2X2.2X2.5cm on [place]) mass in [place], multiple enlarged left axillary nodes, largest 2.5cm.       01/29/2016 Initial Diagnosis    Malignant neoplasm of upper-outer quadrant of left female breast ([place])      01/29/2016 Initial Biopsy    Left breast mass and axillary node biopsy showed IDC, G3,       01/29/2016 Receptors her2    ER-, PR-, HER2-, Ki67 85%      02/11/2016 Imaging    Bilateral breast MRI with and without contrast showed a 3.3 x 2.6 x 2.6 cm mass in the upper outer left breast, and left axillary lymphadenopathy, at least 3 abnormal lymph nodes, no other additional sites of concern.      02/12/2016 Imaging    CT chest, abdomen and pelvis with contrast showed a 2.4 x 2.7 cm lesion in the upper outer left breast, left axillary node metastasis measuring up to 1.3 cm, no evidence of distant metastasis.      02/12/2016 Imaging    Bone scan was negative for skeletal metastasis.      02/13/2016 - 07/03/2016 Neo-Adjuvant Chemotherapy    Dose dense Adriamycin 60 mg/m, Cytoxan 600 mg/m, every 2 weeks, for 4 cycles,  followed by weekly carboplatin and Taxol for 12 weeks       03/27/2016 Genetic Testing    Patient has genetic testing done for personal history of breast cancer, family history of cancer. ATM c.2606C>T VUS identified on the Breast/GYN panel.  Negative genetic testing for the MSH2 inversion analysis (Boland inversion). The Breast/GYN gene panel offered by [facility] includes sequencing and rearrangement analysis for the following 23 genes:  ATM, BARD1, BRCA1, BRCA2, BRIP1, CDH1, CHEK2, EPCAM, FANCC, MLH1, MSH2, MSH6, MUTYH, NBN, NF1, PALB2, PMS2, POLD1, PTEN, RAD51C, RAD51D, RECQL, and TP53.         07/11/2016 Imaging    Bilateral Breast MRI FINDINGS: Breast composition: c. Heterogeneous fibroglandular tissue.  Background parenchymal enhancement: Minimal.  Right breast: No mass or abnormal enhancement.  Left breast: No mass or abnormal enhancement. Biopsy clip is identified at the posterior left breast upper outer quadrant. The previously noted enhancing mass is not seen currently.  Lymph nodes: No abnormal appearing lymph nodes. Previously noted abnormal lymph nodes in the left axilla are currently normal size.  Ancillary findings:  None.  IMPRESSION: Known cancer.      07/31/2016 Pathology Results    Diagnosis 1. Breast, lumpectomy, Left - LOBULAR NEOPLASIA (ATYPICAL LOBULAR HYPERPLASIA). - FIBROCYSTIC CHANGES WITH ADENOSIS AND CALCIFICATIONS. - [place]. - SEE ONCOLOGY TABLE BELOW. 2. Breast, excision, Left additional Medial Margin - LOBULAR NEOPLASIA (  ATYPICAL LOBULAR HYPERPLASIA). - FIBROCYSTIC CHANGES WITH ADENOSIS AND CALCIFICATIONS. - RADIAL SCAR. - HEALING BIOPSY SITE. - SEE COMMENT. 3. Lymph node, sentinel, biopsy, Left - THERE IS NO EVIDENCE OF CARCINOMA IN 1 OF 1 LYMPH NODE (0/1) - CHANGES CONSISTENT WITH PRIOR PROCEDURE. 4. Lymph node, sentinel, biopsy, Left - THERE IS NO EVIDENCE OF CARCINOMA IN 1 OF 1 LYMPH NODE (0/1) 5. Lymph node,  sentinel, biopsy, Left - THERE IS NO EVIDENCE OF CARCINOMA IN 1 OF 1 LYMPH NODE (0/1) 6. Lymph node, sentinel, biopsy, Left - THERE IS NO EVIDENCE OF CARCINOMA IN 1 OF 1 LYMPH NODE (0/1) 7. Lymph node, sentinel, biopsy, Left - THERE IS NO EVIDENCE OF CARCINOMA IN 1 OF 1 LYMPH NODE (0/1)      07/31/2016 Surgery    LEFT BREAST RADIOACTIVE SEED GUIDED LUMPECTOMY WITH LEFT RADIOACTIVE SEED TARGETED AXILLARY SENTINEL LYMPH NODE EXCISION AND SENTINEL LYMPH NODE BIOPSY and port removal done by Dr. Donne Hazel.        08/05/2016 Pathology Results    Surgical pathology  Diagnosis 1. Breast, Mammoplasty, Left - SCLEROSING ADENOSIS WITH CALCIFICATIONS - FIBROCYSTIC CHANGES - DUCT ECTASIA - NO MALIGNANCY IDENTIFIED 2. Breast, Mammoplasty, Right - SCLEROSING ADENOSIS WITH CALCIFICATIONS - FIBROCYSTIC CHANGES - DUCT ECTASIA - NO MALIGNANCY IDENTIFIED      08/05/2016 Surgery    LEFT ONCOPLASTIC REDUCTION; RIGHT BREAST REDUCTION by Dr. Iran Planas       09/11/2016 -  Radiation Therapy    Radiation with Dr. Lisbeth Renshaw      HISTORY OF PRESENTING ILLNESS (02/06/16) :  Grace Moyer 56 y.o. female is here because of her recently diagnosed left breast cancer. She was accompanied By her husband to our multidisciplinary breast clinic today.  This was discovered by screening mammogram, she had no palpable breast mass or axilla note. She denies any other new symptoms. She underwent a diagnostic mammogram and ultrasound on 01/28/2016, which showed a 2.9 cm mass in the upper outer quadrant, and multiple enlarged left axillary nodes, largest 2.5 cm. She underwent core needle biopsy of the left breast mass and left axillary node, both showed invasive ductal carcinoma, grade 3, triple negative, Ki-67 85%.  She developed bilateral numbness and tingling of her legs and hands at the end of May 2017. She underwent a multiple scans, and lab test, and was finally seen by neurologist Dr. Moshe Cipro, and was diagnosed with  neuromyelitis optica spectrum disorder (NOSD). She initially received high-dose steroids, and then started Rituxan infusion, status post 2 infusions, now is on every four-month schedule, next due in Jan 2018. She states her neuropathy has been stable, she denies any significant vision problem, or other neurological symptoms. Her hand function and gait are normal. She is an Clinical biochemist.   GYN HISTORY  Menarchal: 13 LMP: 55 (hysterectomy) Contraceptive: 3 years  HRT: n/a  G1P1: 1 yo daughter   CURRENT THERAPY:  Radiation started 09/11/16 to 10/27/16 Xarelto    INTERIM HISTORY  Mrs Rossmann returns for follow-up. She presents to the clinic today reporting she feels fine. She reports to have recovered some since her blood clot. She is now home and her energy is back. She did not go home with oxygen. Her right 1st digit is black on the toenail. She hurt it in February. Her toes are numb on both feet form neuropathy. Her fingers are better but still some tingling.   She has a cyst that bleeds occasionally. Dr. Lucia Gaskins Lanced it but opened back up. She has not  seen Dr. Jerrye Beavers since she went to the hospital for PE. She would like to get the cyst taken out. She will get Rituxan soon at [facility].  She wanted to know if 23 and me DNA would be effected by her chemo treatment.  She is working part time right now.     MEDICAL HISTORY:  Past Medical History:  Diagnosis Date  . Anxiety   . Family history of breast cancer   . Family history of colon cancer   . Malignant neoplasm of upper-outer quadrant of left female breast ([place]) 01/31/2016  . Neuromyelitis optica ([place])     SURGICAL HISTORY: Past Surgical History:  Procedure Laterality Date  . ABDOMINAL HYSTERECTOMY     partial  . BREAST LUMPECTOMY WITH RADIOACTIVE SEED AND SENTINEL LYMPH NODE BIOPSY Left 07/31/2016   Procedure: LEFT BREAST RADIOACTIVE SEED GUIDED LUMPECTOMY WITH LEFT RADIOACTIVE SEED TARGETED AXILLARY SENTINEL LYMPH NODE  EXCISION AND SENTINEL LYMPH NODE BIOPSY;  Surgeon: Rolm Bookbinder, MD;  Location: [place];  Service: General;  Laterality: Left;  . BREAST REDUCTION SURGERY Bilateral 08/05/2016   Procedure: LEFT ONCOPLASTIC REDUCTION; RIGHT BREAST REDUCTION;  Surgeon: Irene Limbo, MD;  Location: [place];  Service: Plastics;  Laterality: Bilateral;  . EYE SURGERY    . PORT-A-CATH REMOVAL Right 07/31/2016   Procedure: REMOVAL PORT-A-CATH;  Surgeon: Rolm Bookbinder, MD;  Location: [place];  Service: General;  Laterality: Right;  . PORTACATH PLACEMENT Right 02/11/2016   Procedure: INSERTION PORT-A-CATH WITH [place];  Surgeon: Rolm Bookbinder, MD;  Location: [place];  Service: General;  Laterality: Right;    SOCIAL HISTORY: Social History   Social History  . Marital status: Married    Spouse name: N/A  . Number of children: N/A  . Years of education: N/A   Occupational History  . Not on file.   Social History Main Topics  . Smoking status: Former Smoker    Types: Cigarettes    Quit date: 09/03/2001  . Smokeless tobacco: Never Used  . Alcohol use Yes     Comment: social  . Drug use: No  . Sexual activity: Not on file   Other Topics Concern  . Not on file   Social History Narrative  . No narrative on file    FAMILY HISTORY: Family History  Problem Relation Age of Onset  . Breast cancer Maternal Grandmother        dx in her 38s  . CAD Paternal Grandmother   . Colon cancer Paternal Uncle        dx in his 57s  . COPD Maternal Grandfather   . CAD Paternal Grandfather   . Breast cancer Cousin        dx in her early to mid 17s; maternal first cousin  . Lung cancer Paternal Uncle     ALLERGIES:  is allergic to hydrocodone.  MEDICATIONS:  Current Outpatient Prescriptions  Medication Sig Dispense Refill  . Cholecalciferol (VITAMIN D-1000 MAX ST) 1000 units tablet Take 2 tablets by mouth daily.    . hyaluronate  sodium (RADIAPLEXRX) GEL Apply 1 application topically 2 (two) times daily.    . non-metallic deodorant Jethro Poling) MISC Apply 1 application topically daily as needed.    . rivaroxaban (XARELTO) 20 MG TABS tablet Take 1 tablet (20 mg total) by mouth daily with supper. 30 tablet 0  . oxyCODONE (OXY IR/ROXICODONE) 5 MG immediate release tablet Take 1-2 tablets (5-10 mg total) by mouth every 4 (  four) hours as needed (for pain score of 1-4). (Patient not taking: Reported on 09/09/2016) 30 tablet 0   No current facility-administered medications for this visit.    Facility-Administered Medications Ordered in Other Visits  Medication Dose Route Frequency Provider Last Rate Last Dose  . heparin lock flush 100 unit/mL  500 Units Intravenous Once PRN Truitt Merle, MD      . sodium chloride flush (NS) 0.9 % injection 10 mL  10 mL Intracatheter PRN Truitt Merle, MD   10 mL at 06/18/16 1540  . sodium chloride flush (NS) 0.9 % injection 10 mL  10 mL Intravenous PRN Truitt Merle, MD      . sodium chloride flush (NS) 0.9 % injection 10 mL  10 mL Intravenous PRN Truitt Merle, MD   10 mL at 06/25/16 1106    REVIEW OF SYSTEMS:   Constitutional: Denies fevers, chills or abnormal night sweats (+) fatigue (+) slight hair growth Eyes: Denies blurriness of vision, double vision or watery eyes Ears, nose, mouth, throat, and face: Denies mucositis or sore throat Respiratory: Denies cough, dyspnea or wheezes Cardiovascular: Denies palpitation, chest discomfort or lower extremity swelling Gastrointestinal:  Denies nausea, heartburn Skin: (+) black toe nail from previous damage  (+) cyst will bleed with clear discharge Lymphatics: Denies new lymphadenopathy or easy bruising Neurological:(+) bilateral leg weakness, improved (+) tingling in fingers/toes, neuropathy stable MSK: (+) restless leg feelings during treatment, resolved (+) back pain Behavioral/Psych: (+) anxiety about cancer recurrance All other systems were reviewed with the  patient and are negative.  PHYSICAL EXAMINATION: ECOG PERFORMANCE STATUS: 1 - Symptomatic but completely ambulatory  There were no vitals filed for this visit.  09/29/16 BP: 143/76 P: 77 R: 18 W: 186 lbs 13 oz.   GENERAL:alert, no distress and comfortable.  SKIN: skin color, texture, turgor are normal, no rashes, Skin darkness and draining from previous cyst incision In right lower buttock EYES: normal, conjunctiva are pink and non-injected, sclera clear OROPHARYNX:no exudate, no erythema and lips, buccal mucosa, and tongue normal  NECK: supple, thyroid normal size, non-tender, without nodularity. (+) Incision in the right lower neck for port. Port appears clean, neck not significantly swollen, no erythema, no sign of infection. LYMPH:  no palpable lymphadenopathy in the cervical, axillary or inguinal LUNGS: clear to auscultation and percussion with normal breathing effort HEART: regular rate & rhythm and no murmurs and no lower extremity edema ABDOMEN:abdomen soft, non-tender and normal bowel sounds MUSCULOSKELETALl:no cyanosis of digits and no clubbing  PSYCH: alert & oriented x 3 with fluent speech NEURO: no focal motor/sensory deficits Breasts: Breast inspection showed them to be symmetrical with no nipple discharge. Palpation of the breasts and axilla revealed no obvious mass that I could appreciate.   LABORATORY DATA:  I have reviewed the data as listed CBC Latest Ref Rng & Units 09/29/2016 09/15/2016 09/12/2016  WBC 3.9 - 10.3 10e3/uL 4.4 4.0 4.0  Hemoglobin 11.6 - 15.9 g/dL 11.7 11.1(L) 10.5(L)  Hematocrit 34.8 - 46.6 % 34.9 33.9(L) 31.9(L)  Platelets 145 - 400 10e3/uL 173 153 143(L)   CMP Latest Ref Rng & Units 09/29/2016 08/17/2016 08/16/2016  Glucose 70 - 140 mg/dl 90 148(H) 119(H)  BUN 7.0 - 26.0 mg/dL 15.0 7 9  Creatinine 0.6 - 1.1 mg/dL 0.9 0.80 0.79  Sodium 136 - 145 mEq/L 141 133(L) 132(L)  Potassium 3.5 - 5.1 mEq/L 4.1 3.6 3.6  Chloride 101 - 111 mmol/L - 99(L) 102    CO2 22 - 29 mEq/L  26 25 21(L)  Calcium 8.4 - 10.4 mg/dL 9.7 9.0 8.9  Total Protein 6.4 - 8.3 g/dL 6.7 5.8(L) 6.1(L)  Total Bilirubin 0.20 - 1.20 mg/dL 0.47 0.9 1.3(H)  Alkaline Phos 40 - 150 U/L 116 135(H) 159(H)  AST 5 - 34 U/L 44(H) 17 28  ALT 0 - 55 U/L 65(H) 25 32    PATHOLOGY REPORT   Diagnosis 08/05/16 1. Breast, Mammoplasty, Left - SCLEROSING ADENOSIS WITH CALCIFICATIONS - FIBROCYSTIC CHANGES - DUCT ECTASIA - NO MALIGNANCY IDENTIFIED 2. Breast, Mammoplasty, Right - SCLEROSING ADENOSIS WITH CALCIFICATIONS - FIBROCYSTIC CHANGES - DUCT ECTASIA - NO MALIGNANCY IDENTIFIED  Diagnosis 07/31/16 1. Breast, lumpectomy, Left - LOBULAR NEOPLASIA (ATYPICAL LOBULAR HYPERPLASIA). - FIBROCYSTIC CHANGES WITH ADENOSIS AND CALCIFICATIONS. - [place]. - SEE ONCOLOGY TABLE BELOW. 2. Breast, excision, Left additional Medial Margin - LOBULAR NEOPLASIA (ATYPICAL LOBULAR HYPERPLASIA). - FIBROCYSTIC CHANGES WITH ADENOSIS AND CALCIFICATIONS. - RADIAL SCAR. - HEALING BIOPSY SITE. - SEE COMMENT. 3. Lymph node, sentinel, biopsy, Left - THERE IS NO EVIDENCE OF CARCINOMA IN 1 OF 1 LYMPH NODE (0/1) - CHANGES CONSISTENT WITH PRIOR PROCEDURE. 4. Lymph node, sentinel, biopsy, Left - THERE IS NO EVIDENCE OF CARCINOMA IN 1 OF 1 LYMPH NODE (0/1) 5. Lymph node, sentinel, biopsy, Left - THERE IS NO EVIDENCE OF CARCINOMA IN 1 OF 1 LYMPH NODE (0/1) 6. Lymph node, sentinel, biopsy, Left - THERE IS NO EVIDENCE OF CARCINOMA IN 1 OF 1 LYMPH NODE (0/1) 7. Lymph node, sentinel, biopsy, Left - THERE IS NO EVIDENCE OF CARCINOMA IN 1 OF 1 LYMPH NODE (0/1) Microscopic Comment 1. BREAST, STATUS POST NEOADJUVANT TREATMENT Procedure: Seed localized lumpectomy, additional medial margin resection and multiple lymph node resections. Laterality: Left Tumor Size: N/A Histologic Type: N/A Grade: N/A Microscopic Comment(continued) Ductal Carcinoma in Situ (DCIS): Not identified. Regional Lymph  Nodes: Number of Lymph Nodes Examined: 5 Number of Sentinel Lymph Nodes Examined: 5 Lymph Nodes with Macrometastases: 0 Lymph Nodes with Micrometastases: 0 Lymph Nodes with Isolated Tumor Cells: 0 Margins: N/A Extent of Tumor: N/A Breast Prognostic Profile (pre-neoadjuvant case #: [id number]) Estrogen Receptor: 0%. Progesterone Receptor: less than 1%. Her2: No amplification was detected. Ki-67: 85% Residual Cancer Burden (RCB): This case is considered a complete pathologic response. Pathologic Stage Classification (p TNM, AJCC 8th Edition): Primary Tumor (ypT): ypT0 Regional Lymph Nodes (ypN): ypN0 (JBK:gt, 08/04/16) Comments: The breast tissue and lymph nodal tissue both show significant treatment related c   Diagnosis 01/29/2016 1. Breast, left, needle core biopsy, 1:30 o'clock - INVASIVE DUCTAL CARCINOMA, GRADE 3. - LYMPHOVASCULAR INVOLVEMENT BY TUMOR. 2. Lymph node, needle/core biopsy, left axillary - METASTATIC CARCINOMA.  RADIOGRAPHIC STUDIES: I have personally reviewed the radiological images as listed and agreed with the findings in the report. No results found.  Bone scan 02/12/2016 IMPRESSION: No evidence of skeletal metastatic disease.  CT chest abdomen pelvis w contrast 02/12/2016 IMPRESSION: 2.4 x 2.7 cm lesion in the upper outer left breast, corresponding to the patient's known breast neoplasm. Left axillary nodal metastasis measuring up to 13 mm short axis. No evidence of distant metastasis.  ASSESSMENT & PLAN: 56 y.o. Caucasian woman with past medical history of depression, presented with a screening mammogram discovered left breast cancer   1. Malignant neoplasm of upper-outer quadrant of left breast, invasive ductal carcinoma, G3, cT2N1M0, stage IIB, ER-/PR-/HER2- -I previously reviewed her imaging findings and the biopsy results in great detail with patient and her husband. -I previously discussed her breast MRI, CT scan and bone  scan findings, which  all confirmed the left breast lesion and axillary adenopathy, no other distant metastasis. -We previously discussed that triple negative breast cancers are much more aggressive, and her risk of cancer recurrence after breast surgery is pretty high, giving the multiple positive lymph nodes. I recommend her to consider neoadjuvant chemotherapy, to reduce her risk of recurrence, and downstage her breast cancer, to make lumpectomy and sentinel lymph node biopsy as a more feasible surgery. -She received neoadjuvant chemotherapy with dose dense Adriamycin, Cytoxan every 2 weeks, with Neulasta support, for 4 cycles, followed by weekly carboplatin and Taxol for 12 weeks she tolerated well overall.,   -She had surgery 07/31/16 and breast reduction 08/05/16. Pathology was discussed with pt previously and showed no residual cancer after neoadjuvant chemotherapy, which is an excellent prognostic factor for good outcome.  -Giving the complete pathologic response, I do not recommend adjuvant Xeloda.  -She started radiation with Dr. Lisbeth Renshaw on 09/11/16 -I discussed the surveillance plan, which is a physical exam and lab test (including CBC & CMP) every 3-4 months for the first 2 years, then every 6-12 months for a total of 5 years, diagnotic mammogram once a year.    2. Left LE DVT and bilateral acute submassive PE, provoked by surgeries admitted 08/15/16 -She developed extensive left lower extremity DVT with bilateral acute submassive PE, requiring hospitalization -This is probably provoked by her breast surgery and inmobility -I recommend her to remain on Xarelto for 6 months. Then remain on baby aspirin. She is tolerating Xarelto well. Precautions for fall and injury were discussed with patient.  3. Genetics -Given her young age and triple negative disease, we recommend her to have genetic testing to rule out inheritable breast cancer syndrome, and the test result may impact her surgery. -ATM c.2606C>T VUS identified on  the Breast/Gyn panel.  Negative genetic testing for the MSH2 inversion analysis (Boland inversion). The Breast/GYN gene panel offered by [facility] includes sequencing and rearrangement analysis for the following 23 genes:  ATM, BARD1, BRCA1, BRCA2, BRIP1, CDH1, CHEK2, EPCAM, FANCC, MLH1, MSH2, MSH6, MUTYH, NBN, NF1, PALB2, PMS2, POLD1, PTEN, RAD51C, RAD51D, RECQL, and TP53.   The report date is March 27, 2016  4. neuromyelitis optica spectrum disorder (NOSD) -she will continue follow up with her neurologist Dr. Moshe Cipro at [facility]  -she is on rituximab every 4 weeks. It will be OK to continue her Rituximab when she is on chemotherapy, but the combination therapy will further compromise her immune system. -I have previously spoken with Dr. Moshe Cipro about her breast cancer treatment, he is agreeable. -She previously received Rituxan in our cancer center on 03/21/2016, tolerated generally well. She prefers to get next dose Rituxan here with [place], which will be scheduled in September.   4. Peripheral neuropathy, G1 -Her tingling and numbness got slightly worse towards the end of chemotherapy, likely related to her chemotherapy -No need to Neurontin at this point, continue monitoring.  5. Anemia secondary to chemo -overall mild and stable -We'll monitor closely  7. Sebaceous cyst on right buttock -Was previously infected and drained by Dr. Lucia Gaskins -Now she has Skin darkness and draining from previous cyst incision In right lower buttock -refer back to Dr. Donne Hazel.     Plan -Lab and Rituxan 9/7 or 9/14  -Lab and f/u in 3 months  -Continue Xarelto    No orders of the defined types were placed in this encounter.   All questions were answered. The patient knows to call the clinic with  any problems, questions or concerns.  I spent 20 minutes counseling the patient face to face. The total time spent in the appointment was 25 minutes and more than 50% was on counseling.  This document serves as a record  of services personally performed by Truitt Merle, MD. It was created on her behalf by Joslyn Devon, a trained medical scribe. The creation of this record is based on the scribe's personal observations and the provider's statements to them. This document has been checked and approved by the attending provider.     Truitt Merle, MD 09/29/2016

## 2016-09-30 ENCOUNTER — Ambulatory Visit
Admission: RE | Admit: 2016-09-30 | Discharge: 2016-09-30 | Disposition: A | Discharge status: Home / Self Care | Payer: 59 | Source: Ambulatory Visit | Attending: Radiation Oncology | Admitting: Radiation Oncology

## 2016-09-30 ENCOUNTER — Telehealth: Payer: Self-pay | Admitting: Hematology

## 2016-09-30 DIAGNOSIS — C50412 Malignant neoplasm of upper-outer quadrant of left female breast: Secondary | ICD-10-CM | POA: Diagnosis not present

## 2016-09-30 DIAGNOSIS — Z510 Encounter for antineoplastic radiation therapy: Secondary | ICD-10-CM | POA: Diagnosis not present

## 2016-09-30 NOTE — Telephone Encounter (Signed)
Called patient and left voicemail regarding her appointments in September and October.

## 2016-10-01 ENCOUNTER — Ambulatory Visit
Admission: RE | Admit: 2016-10-01 | Discharge: 2016-10-01 | Disposition: A | Discharge status: Home / Self Care | Payer: 59 | Source: Ambulatory Visit | Attending: Radiation Oncology | Admitting: Radiation Oncology

## 2016-10-01 DIAGNOSIS — Z510 Encounter for antineoplastic radiation therapy: Secondary | ICD-10-CM | POA: Diagnosis not present

## 2016-10-01 DIAGNOSIS — C50412 Malignant neoplasm of upper-outer quadrant of left female breast: Secondary | ICD-10-CM | POA: Diagnosis not present

## 2016-10-02 ENCOUNTER — Ambulatory Visit
Admission: RE | Admit: 2016-10-02 | Discharge: 2016-10-02 | Disposition: A | Discharge status: Home / Self Care | Payer: 59 | Source: Ambulatory Visit | Attending: Radiation Oncology | Admitting: Radiation Oncology

## 2016-10-02 DIAGNOSIS — Z510 Encounter for antineoplastic radiation therapy: Secondary | ICD-10-CM | POA: Diagnosis not present

## 2016-10-02 DIAGNOSIS — C50412 Malignant neoplasm of upper-outer quadrant of left female breast: Secondary | ICD-10-CM | POA: Diagnosis not present

## 2016-10-03 ENCOUNTER — Ambulatory Visit
Admission: RE | Admit: 2016-10-03 | Discharge: 2016-10-03 | Disposition: A | Discharge status: Home / Self Care | Payer: 59 | Source: Ambulatory Visit | Attending: Radiation Oncology | Admitting: Radiation Oncology

## 2016-10-03 ENCOUNTER — Other Ambulatory Visit: Payer: Self-pay | Admitting: Hematology

## 2016-10-03 ENCOUNTER — Other Ambulatory Visit: Payer: Self-pay | Admitting: *Deleted

## 2016-10-03 DIAGNOSIS — C50412 Malignant neoplasm of upper-outer quadrant of left female breast: Secondary | ICD-10-CM | POA: Diagnosis not present

## 2016-10-03 DIAGNOSIS — Z510 Encounter for antineoplastic radiation therapy: Secondary | ICD-10-CM | POA: Diagnosis not present

## 2016-10-03 MED ORDER — RIVAROXABAN 20 MG PO TABS: 20.0000 mg | ORAL_TABLET | Freq: Every day | ORAL | 3 refills | Status: DC

## 2016-10-06 ENCOUNTER — Ambulatory Visit
Admission: RE | Admit: 2016-10-06 | Discharge: 2016-10-06 | Disposition: A | Discharge status: Home / Self Care | Payer: 59 | Source: Ambulatory Visit | Attending: Radiation Oncology | Admitting: Radiation Oncology

## 2016-10-06 DIAGNOSIS — C50412 Malignant neoplasm of upper-outer quadrant of left female breast: Secondary | ICD-10-CM | POA: Diagnosis not present

## 2016-10-06 DIAGNOSIS — Z510 Encounter for antineoplastic radiation therapy: Secondary | ICD-10-CM | POA: Diagnosis not present

## 2016-10-07 ENCOUNTER — Ambulatory Visit
Admission: RE | Admit: 2016-10-07 | Discharge: 2016-10-07 | Disposition: A | Discharge status: Home / Self Care | Payer: 59 | Source: Ambulatory Visit | Attending: Radiation Oncology | Admitting: Radiation Oncology

## 2016-10-07 DIAGNOSIS — C50412 Malignant neoplasm of upper-outer quadrant of left female breast: Secondary | ICD-10-CM | POA: Diagnosis not present

## 2016-10-07 DIAGNOSIS — Z510 Encounter for antineoplastic radiation therapy: Secondary | ICD-10-CM | POA: Diagnosis not present

## 2016-10-08 ENCOUNTER — Ambulatory Visit
Admission: RE | Admit: 2016-10-08 | Discharge: 2016-10-08 | Disposition: A | Discharge status: Home / Self Care | Payer: 59 | Source: Ambulatory Visit | Attending: Radiation Oncology | Admitting: Radiation Oncology

## 2016-10-08 DIAGNOSIS — G360 Neuromyelitis optica [Devic]: Secondary | ICD-10-CM | POA: Diagnosis not present

## 2016-10-08 DIAGNOSIS — H40053 Ocular hypertension, bilateral: Secondary | ICD-10-CM | POA: Diagnosis not present

## 2016-10-08 DIAGNOSIS — C50412 Malignant neoplasm of upper-outer quadrant of left female breast: Secondary | ICD-10-CM | POA: Diagnosis not present

## 2016-10-08 DIAGNOSIS — Z510 Encounter for antineoplastic radiation therapy: Secondary | ICD-10-CM | POA: Diagnosis not present

## 2016-10-09 ENCOUNTER — Ambulatory Visit
Admission: RE | Admit: 2016-10-09 | Discharge: 2016-10-09 | Disposition: A | Discharge status: Home / Self Care | Payer: 59 | Source: Ambulatory Visit | Attending: Radiation Oncology | Admitting: Radiation Oncology

## 2016-10-09 DIAGNOSIS — C50412 Malignant neoplasm of upper-outer quadrant of left female breast: Secondary | ICD-10-CM | POA: Diagnosis not present

## 2016-10-09 DIAGNOSIS — Z510 Encounter for antineoplastic radiation therapy: Secondary | ICD-10-CM | POA: Diagnosis not present

## 2016-10-10 ENCOUNTER — Ambulatory Visit
Admission: RE | Admit: 2016-10-10 | Discharge: 2016-10-10 | Disposition: A | Discharge status: Home / Self Care | Payer: 59 | Source: Ambulatory Visit | Attending: Radiation Oncology | Admitting: Radiation Oncology

## 2016-10-10 DIAGNOSIS — C50412 Malignant neoplasm of upper-outer quadrant of left female breast: Secondary | ICD-10-CM | POA: Diagnosis not present

## 2016-10-10 DIAGNOSIS — Z510 Encounter for antineoplastic radiation therapy: Secondary | ICD-10-CM | POA: Diagnosis not present

## 2016-10-13 ENCOUNTER — Ambulatory Visit
Admission: RE | Admit: 2016-10-13 | Discharge: 2016-10-13 | Disposition: A | Discharge status: Home / Self Care | Payer: 59 | Source: Ambulatory Visit | Attending: Radiation Oncology | Admitting: Radiation Oncology

## 2016-10-13 DIAGNOSIS — Z510 Encounter for antineoplastic radiation therapy: Secondary | ICD-10-CM | POA: Diagnosis not present

## 2016-10-13 DIAGNOSIS — C50412 Malignant neoplasm of upper-outer quadrant of left female breast: Secondary | ICD-10-CM | POA: Diagnosis not present

## 2016-10-14 ENCOUNTER — Ambulatory Visit
Admission: RE | Admit: 2016-10-14 | Discharge: 2016-10-14 | Disposition: A | Discharge status: Home / Self Care | Payer: 59 | Source: Ambulatory Visit | Attending: Radiation Oncology | Admitting: Radiation Oncology

## 2016-10-14 ENCOUNTER — Ambulatory Visit: Payer: 59 | Admitting: Radiation Oncology

## 2016-10-14 DIAGNOSIS — Z510 Encounter for antineoplastic radiation therapy: Secondary | ICD-10-CM | POA: Diagnosis not present

## 2016-10-14 DIAGNOSIS — C50412 Malignant neoplasm of upper-outer quadrant of left female breast: Secondary | ICD-10-CM | POA: Diagnosis not present

## 2016-10-15 ENCOUNTER — Ambulatory Visit: Payer: 59 | Admitting: Radiation Oncology

## 2016-10-15 ENCOUNTER — Ambulatory Visit
Admission: RE | Admit: 2016-10-15 | Discharge: 2016-10-15 | Disposition: A | Discharge status: Home / Self Care | Payer: 59 | Source: Ambulatory Visit | Attending: Radiation Oncology | Admitting: Radiation Oncology

## 2016-10-15 DIAGNOSIS — C50412 Malignant neoplasm of upper-outer quadrant of left female breast: Secondary | ICD-10-CM | POA: Diagnosis not present

## 2016-10-15 DIAGNOSIS — Z510 Encounter for antineoplastic radiation therapy: Secondary | ICD-10-CM | POA: Diagnosis not present

## 2016-10-15 DIAGNOSIS — G360 Neuromyelitis optica [Devic]: Secondary | ICD-10-CM | POA: Diagnosis not present

## 2016-10-15 DIAGNOSIS — H40053 Ocular hypertension, bilateral: Secondary | ICD-10-CM | POA: Diagnosis not present

## 2016-10-16 ENCOUNTER — Ambulatory Visit
Admission: RE | Admit: 2016-10-16 | Discharge: 2016-10-16 | Disposition: A | Discharge status: Home / Self Care | Payer: 59 | Source: Ambulatory Visit | Attending: Radiation Oncology | Admitting: Radiation Oncology

## 2016-10-16 DIAGNOSIS — C50412 Malignant neoplasm of upper-outer quadrant of left female breast: Secondary | ICD-10-CM | POA: Diagnosis not present

## 2016-10-16 DIAGNOSIS — Z510 Encounter for antineoplastic radiation therapy: Secondary | ICD-10-CM | POA: Diagnosis not present

## 2016-10-17 ENCOUNTER — Ambulatory Visit
Admission: RE | Admit: 2016-10-17 | Discharge: 2016-10-17 | Disposition: A | Discharge status: Home / Self Care | Payer: 59 | Source: Ambulatory Visit | Attending: Radiation Oncology | Admitting: Radiation Oncology

## 2016-10-17 DIAGNOSIS — C50412 Malignant neoplasm of upper-outer quadrant of left female breast: Secondary | ICD-10-CM | POA: Diagnosis not present

## 2016-10-17 DIAGNOSIS — Z510 Encounter for antineoplastic radiation therapy: Secondary | ICD-10-CM | POA: Diagnosis not present

## 2016-10-20 ENCOUNTER — Ambulatory Visit
Admission: RE | Admit: 2016-10-20 | Discharge: 2016-10-20 | Disposition: A | Discharge status: Home / Self Care | Payer: 59 | Source: Ambulatory Visit | Attending: Radiation Oncology | Admitting: Radiation Oncology

## 2016-10-20 DIAGNOSIS — Z510 Encounter for antineoplastic radiation therapy: Secondary | ICD-10-CM | POA: Diagnosis not present

## 2016-10-20 DIAGNOSIS — C50412 Malignant neoplasm of upper-outer quadrant of left female breast: Secondary | ICD-10-CM | POA: Diagnosis not present

## 2016-10-21 ENCOUNTER — Ambulatory Visit
Admission: RE | Admit: 2016-10-21 | Discharge: 2016-10-21 | Disposition: A | Discharge status: Home / Self Care | Payer: 59 | Source: Ambulatory Visit | Attending: Radiation Oncology | Admitting: Radiation Oncology

## 2016-10-21 DIAGNOSIS — C50412 Malignant neoplasm of upper-outer quadrant of left female breast: Secondary | ICD-10-CM | POA: Diagnosis not present

## 2016-10-21 DIAGNOSIS — Z510 Encounter for antineoplastic radiation therapy: Secondary | ICD-10-CM | POA: Diagnosis not present

## 2016-10-22 ENCOUNTER — Ambulatory Visit
Admission: RE | Admit: 2016-10-22 | Discharge: 2016-10-22 | Disposition: A | Discharge status: Home / Self Care | Payer: 59 | Source: Ambulatory Visit | Attending: Radiation Oncology | Admitting: Radiation Oncology

## 2016-10-22 DIAGNOSIS — Z510 Encounter for antineoplastic radiation therapy: Secondary | ICD-10-CM | POA: Diagnosis not present

## 2016-10-22 DIAGNOSIS — C50412 Malignant neoplasm of upper-outer quadrant of left female breast: Secondary | ICD-10-CM | POA: Diagnosis not present

## 2016-10-23 ENCOUNTER — Ambulatory Visit
Admission: RE | Admit: 2016-10-23 | Discharge: 2016-10-23 | Disposition: A | Discharge status: Home / Self Care | Payer: 59 | Source: Ambulatory Visit | Attending: Radiation Oncology | Admitting: Radiation Oncology

## 2016-10-23 DIAGNOSIS — Z510 Encounter for antineoplastic radiation therapy: Secondary | ICD-10-CM | POA: Diagnosis not present

## 2016-10-23 DIAGNOSIS — C50412 Malignant neoplasm of upper-outer quadrant of left female breast: Secondary | ICD-10-CM | POA: Diagnosis not present

## 2016-10-24 ENCOUNTER — Ambulatory Visit
Admission: RE | Admit: 2016-10-24 | Discharge: 2016-10-24 | Disposition: A | Discharge status: Home / Self Care | Payer: 59 | Source: Ambulatory Visit | Attending: Radiation Oncology | Admitting: Radiation Oncology

## 2016-10-24 DIAGNOSIS — C50412 Malignant neoplasm of upper-outer quadrant of left female breast: Secondary | ICD-10-CM | POA: Diagnosis not present

## 2016-10-24 DIAGNOSIS — Z510 Encounter for antineoplastic radiation therapy: Secondary | ICD-10-CM | POA: Diagnosis not present

## 2016-10-27 ENCOUNTER — Encounter: Payer: Self-pay | Admitting: Radiation Oncology

## 2016-10-27 ENCOUNTER — Encounter: Payer: Self-pay | Admitting: *Deleted

## 2016-10-27 ENCOUNTER — Telehealth: Payer: Self-pay

## 2016-10-27 ENCOUNTER — Ambulatory Visit
Admission: RE | Admit: 2016-10-27 | Discharge: 2016-10-27 | Disposition: A | Discharge status: Home / Self Care | Payer: 59 | Source: Ambulatory Visit | Attending: Radiation Oncology | Admitting: Radiation Oncology

## 2016-10-27 DIAGNOSIS — C50412 Malignant neoplasm of upper-outer quadrant of left female breast: Secondary | ICD-10-CM | POA: Diagnosis not present

## 2016-10-27 DIAGNOSIS — Z510 Encounter for antineoplastic radiation therapy: Secondary | ICD-10-CM | POA: Diagnosis not present

## 2016-10-27 NOTE — Telephone Encounter (Signed)
Left a voice message for patient concerning new upcoming appointment for 1/27.

## 2016-11-06 DIAGNOSIS — G369 Acute disseminated demyelination, unspecified: Secondary | ICD-10-CM | POA: Diagnosis not present

## 2016-11-06 DIAGNOSIS — M50222 Other cervical disc displacement at C5-C6 level: Secondary | ICD-10-CM | POA: Diagnosis not present

## 2016-11-06 DIAGNOSIS — G360 Neuromyelitis optica [Devic]: Secondary | ICD-10-CM | POA: Diagnosis not present

## 2016-11-07 ENCOUNTER — Other Ambulatory Visit (HOSPITAL_BASED_OUTPATIENT_CLINIC_OR_DEPARTMENT_OTHER): Payer: 59

## 2016-11-07 ENCOUNTER — Ambulatory Visit (HOSPITAL_BASED_OUTPATIENT_CLINIC_OR_DEPARTMENT_OTHER): Payer: 59

## 2016-11-07 VITALS — BP 107/61 | HR 62 | Temp 98.3°F | Resp 16

## 2016-11-07 DIAGNOSIS — C50412 Malignant neoplasm of upper-outer quadrant of left female breast: Secondary | ICD-10-CM

## 2016-11-07 DIAGNOSIS — Z171 Estrogen receptor negative status [ER-]: Secondary | ICD-10-CM

## 2016-11-07 DIAGNOSIS — G360 Neuromyelitis optica [Devic]: Secondary | ICD-10-CM

## 2016-11-07 DIAGNOSIS — Z5112 Encounter for antineoplastic immunotherapy: Secondary | ICD-10-CM

## 2016-11-07 LAB — CBC WITH DIFFERENTIAL/PLATELET
BASO%: 0.5 % (ref 0.0–2.0)
Basophils Absolute: 0 10*3/uL (ref 0.0–0.1)
EOS%: 3.6 % (ref 0.0–7.0)
Eosinophils Absolute: 0.1 10*3/uL (ref 0.0–0.5)
HCT: 38.5 % (ref 34.8–46.6)
HGB: 12.9 g/dL (ref 11.6–15.9)
LYMPH%: 13 % — ABNORMAL LOW (ref 14.0–49.7)
MCH: 29.5 pg (ref 25.1–34.0)
MCHC: 33.5 g/dL (ref 31.5–36.0)
MCV: 87.9 fL (ref 79.5–101.0)
MONO#: 0.2 10*3/uL (ref 0.1–0.9)
MONO%: 5.5 % (ref 0.0–14.0)
NEUT#: 3 10*3/uL (ref 1.5–6.5)
NEUT%: 77.4 % — ABNORMAL HIGH (ref 38.4–76.8)
Platelets: 173 10*3/uL (ref 145–400)
RBC: 4.38 10*6/uL (ref 3.70–5.45)
RDW: 14.9 % — ABNORMAL HIGH (ref 11.2–14.5)
WBC: 3.8 10*3/uL — ABNORMAL LOW (ref 3.9–10.3)
lymph#: 0.5 10*3/uL — ABNORMAL LOW (ref 0.9–3.3)

## 2016-11-07 LAB — COMPREHENSIVE METABOLIC PANEL
ALT: 34 U/L (ref 0–55)
AST: 26 U/L (ref 5–34)
Albumin: 4 g/dL (ref 3.5–5.0)
Alkaline Phosphatase: 112 U/L (ref 40–150)
Anion Gap: 9 mEq/L (ref 3–11)
BUN: 17.4 mg/dL (ref 7.0–26.0)
CO2: 24 mEq/L (ref 22–29)
Calcium: 9.8 mg/dL (ref 8.4–10.4)
Chloride: 108 mEq/L (ref 98–109)
Creatinine: 1 mg/dL (ref 0.6–1.1)
EGFR: 64 mL/min/{1.73_m2} — ABNORMAL LOW (ref >90)
Glucose: 94 mg/dl (ref 70–140)
Potassium: 4 mEq/L (ref 3.5–5.1)
Sodium: 141 mEq/L (ref 136–145)
Total Bilirubin: 0.64 mg/dL (ref 0.20–1.20)
Total Protein: 7.1 g/dL (ref 6.4–8.3)

## 2016-11-07 MED ORDER — ACETAMINOPHEN 325 MG PO TABS
650.0000 mg | ORAL_TABLET | Freq: Once | ORAL | Status: AC
  Administered 2016-11-07: 650 mg via ORAL

## 2016-11-07 MED ORDER — DIPHENHYDRAMINE HCL 50 MG/ML IJ SOLN
25.0000 mg | Freq: Once | INTRAMUSCULAR | Status: AC
  Administered 2016-11-07: 25 mg via INTRAVENOUS

## 2016-11-07 MED ORDER — ACETAMINOPHEN 325 MG PO TABS
ORAL_TABLET | ORAL | Status: AC
  Filled 2016-11-07: qty 2

## 2016-11-07 MED ORDER — SODIUM CHLORIDE 0.9 % IV SOLN
Freq: Once | INTRAVENOUS | Status: AC
  Administered 2016-11-07: 13:00:00 via INTRAVENOUS

## 2016-11-07 MED ORDER — SODIUM CHLORIDE 0.9 % IV SOLN
500.0000 mg | Freq: Once | INTRAVENOUS | Status: AC
  Administered 2016-11-07: 500 mg via INTRAVENOUS
  Filled 2016-11-07: qty 50

## 2016-11-07 MED ORDER — DIPHENHYDRAMINE HCL 50 MG/ML IJ SOLN
INTRAMUSCULAR | Status: AC
  Filled 2016-11-07: qty 1

## 2016-11-07 NOTE — Patient Instructions (Signed)

## 2016-11-11 ENCOUNTER — Other Ambulatory Visit: Payer: Self-pay | Admitting: Obstetrics & Gynecology

## 2016-11-11 DIAGNOSIS — Z853 Personal history of malignant neoplasm of breast: Secondary | ICD-10-CM

## 2016-11-19 ENCOUNTER — Ambulatory Visit (HOSPITAL_COMMUNITY)
Admission: RE | Admit: 2016-11-19 | Discharge: 2016-11-19 | Disposition: A | Discharge status: Home / Self Care | Payer: 59 | Source: Ambulatory Visit | Attending: Obstetrics & Gynecology | Admitting: Obstetrics & Gynecology

## 2016-11-19 ENCOUNTER — Ambulatory Visit (HOSPITAL_BASED_OUTPATIENT_CLINIC_OR_DEPARTMENT_OTHER)
Admission: RE | Admit: 2016-11-19 | Discharge: 2016-11-19 | Disposition: A | Discharge status: Home / Self Care | Payer: 59 | Source: Ambulatory Visit | Attending: Internal Medicine | Admitting: Internal Medicine

## 2016-11-19 ENCOUNTER — Encounter (HOSPITAL_COMMUNITY): Payer: Self-pay | Admitting: Internal Medicine

## 2016-11-19 VITALS — BP 128/78 | HR 64 | Wt 193.5 lb

## 2016-11-19 DIAGNOSIS — C50412 Malignant neoplasm of upper-outer quadrant of left female breast: Secondary | ICD-10-CM | POA: Insufficient documentation

## 2016-11-19 DIAGNOSIS — Z87891 Personal history of nicotine dependence: Secondary | ICD-10-CM | POA: Insufficient documentation

## 2016-11-19 DIAGNOSIS — F419 Anxiety disorder, unspecified: Secondary | ICD-10-CM | POA: Insufficient documentation

## 2016-11-19 DIAGNOSIS — Z8249 Family history of ischemic heart disease and other diseases of the circulatory system: Secondary | ICD-10-CM | POA: Diagnosis not present

## 2016-11-19 DIAGNOSIS — C50919 Malignant neoplasm of unspecified site of unspecified female breast: Secondary | ICD-10-CM

## 2016-11-19 DIAGNOSIS — G360 Neuromyelitis optica [Devic]: Secondary | ICD-10-CM | POA: Diagnosis not present

## 2016-11-19 DIAGNOSIS — Z801 Family history of malignant neoplasm of trachea, bronchus and lung: Secondary | ICD-10-CM | POA: Insufficient documentation

## 2016-11-19 DIAGNOSIS — Z923 Personal history of irradiation: Secondary | ICD-10-CM | POA: Diagnosis not present

## 2016-11-19 DIAGNOSIS — Z86718 Personal history of other venous thrombosis and embolism: Secondary | ICD-10-CM | POA: Insufficient documentation

## 2016-11-19 DIAGNOSIS — Z171 Estrogen receptor negative status [ER-]: Secondary | ICD-10-CM | POA: Diagnosis not present

## 2016-11-19 DIAGNOSIS — Z803 Family history of malignant neoplasm of breast: Secondary | ICD-10-CM | POA: Insufficient documentation

## 2016-11-19 DIAGNOSIS — Z7901 Long term (current) use of anticoagulants: Secondary | ICD-10-CM | POA: Diagnosis not present

## 2016-11-19 LAB — ECHOCARDIOGRAM COMPLETE
E decel time: 296 msec
E/e' ratio: 5.34
FS: 25 % — AB (ref 28–44)
IVS/LV PW RATIO, ED: 0.92
LA ID, A-P, ES: 36 mm
LA diam end sys: 36 mm
LA diam index: 1.91 cm/m2
LA vol A4C: 42 ml
LA vol index: 21.6 mL/m2
LA vol: 40.7 mL
LV E/e' medial: 5.34
LV E/e'average: 5.34
LV PW d: 8.22 mm — AB (ref 0.6–1.1)
LV e' LATERAL: 12.5 cm/s
LVOT SV: 70 mL
LVOT VTI: 22.3 cm
LVOT area: 3.14 cm2
LVOT diameter: 20 mm
LVOT peak grad rest: 4 mmHg
LVOT peak vel: 102 cm/s
Lateral S' vel: 11.4 cm/s
MV Dec: 296
MV pk A vel: 61.3 m/s
MV pk E vel: 66.8 m/s
TAPSE: 21.7 mm
TDI e' lateral: 12.5
TDI e' medial: 12.2

## 2016-11-19 NOTE — Progress Notes (Signed)
CARDI-ONCOLOGY CONSULT NOTE  Referring Physician: Burr Medico Primary Care: None OB: Dr. Dory Horn   HPI:  Grace Moyer 56 y.o. Woman with little past medical hx except for left breast cancer and neuromyelitis optica . Referred by Dr. Burr Medico for enrollment into cardio-oncology clinic.  Tumor biopsy: invasive ductal carcinoma, G3, cT2N1M0, stage IIB, ER-/PR-/HER2-. + 3-5 LNs +  Denies h/o known heart disease. Smoked a little but quit 2004. Diagnosed with breast cancer in 11/17.   Underwent Adriamycin, Cytoxan every 2 weeks, with Neulasta support, for 4 cycles completed 1/18/ Now on weekly carboplatin and Taxol for 12 weeks. Completed 06/25/16. Has completed surgery and XRT. Got DVT and PE s/p lumpectomy and breast reduction.  Finished XRT 10/27/16.  Remains on [place]. Now feels better. No SOB or leg swelling. Feels great. Workingas an Biomedical engineer. + neuropathy   Echo 12/17 EF 60-65% GLS 16.9% Echo 3/18 55-60% LS' 8.8 cm/s GLS 18.6% Echo 11/19/16 ECHO 60-65% LS 9.6 GLS -21.4%     Past Medical History:  Diagnosis Date  . Anxiety   . Family history of breast cancer   . Family history of colon cancer   . Malignant neoplasm of upper-outer quadrant of left female breast ([place]) 01/31/2016  . Neuromyelitis optica ([place])     Current Outpatient Prescriptions  Medication Sig Dispense Refill  . Cholecalciferol (VITAMIN D-1000 MAX ST) 1000 units tablet Take 2 tablets by mouth daily.    . rivaroxaban (XARELTO) 20 MG TABS tablet Take 1 tablet (20 mg total) by mouth daily with supper. 30 tablet 3  . oxyCODONE (OXY IR/ROXICODONE) 5 MG immediate release tablet Take 1-2 tablets (5-10 mg total) by mouth every 4 (four) hours as needed (for pain score of 1-4). (Patient not taking: Reported on 09/09/2016) 30 tablet 0   No current facility-administered medications for this encounter.    Facility-Administered Medications Ordered in Other Encounters  Medication Dose Route Frequency Provider Last Rate  Last Dose  . heparin lock flush 100 unit/mL  500 Units Intravenous Once PRN Truitt Merle, MD      . sodium chloride flush (NS) 0.9 % injection 10 mL  10 mL Intracatheter PRN Truitt Merle, MD   10 mL at 06/18/16 1540  . sodium chloride flush (NS) 0.9 % injection 10 mL  10 mL Intravenous PRN Truitt Merle, MD      . sodium chloride flush (NS) 0.9 % injection 10 mL  10 mL Intravenous PRN Truitt Merle, MD   10 mL at 06/25/16 1106    Allergies  Allergen Reactions  . Hydrocodone Other (See Comments)    Did not help with pain and kept her up all night      Social History   Social History  . Marital status: Married    Spouse name: N/A  . Number of children: N/A  . Years of education: N/A   Occupational History  . Not on file.   Social History Main Topics  . Smoking status: Former Smoker    Types: Cigarettes    Quit date: 09/03/2001  . Smokeless tobacco: Never Used  . Alcohol use Yes     Comment: social  . Drug use: No  . Sexual activity: Not on file   Other Topics Concern  . Not on file   Social History Narrative  . No narrative on file      Family History  Problem Relation Age of Onset  . Breast cancer Maternal Grandmother  dx in her 40s  . CAD Paternal Grandmother   . Colon cancer Paternal Uncle        dx in his 23s  . COPD Maternal Grandfather   . CAD Paternal Grandfather   . Breast cancer Cousin        dx in her early to mid 47s; maternal first cousin  . Lung cancer Paternal Uncle     Vitals:   11/19/16 1352  BP: 128/78  Pulse: 64  SpO2: 100%  Weight: 193 lb 8 oz (87.8 kg)    PHYSICAL EXAM: General:  Well appearing. No resp difficulty HEENT: normal Neck: supple. no JVD. Carotids 2+ bilat; no bruits. No lymphadenopathy or thryomegaly appreciated. Cor: PMI nondisplaced. Regular rate & rhythm. No rubs, gallops or murmurs. Lungs: clear Abdomen: soft, nontender, nondistended. No hepatosplenomegaly. No bruits or masses. Good bowel sounds. Extremities: no cyanosis,  clubbing, rash, edema Neuro: alert & orientedx3, cranial nerves grossly intact. moves all 4 extremities w/o difficulty. Affect pleasant    ASSESSMENT & PLAN:  1. Malignant neoplasm of upper-outer quadrant of left breast, invasive ductal carcinoma, G3, cT2N1M0, stage IIB, ER-/PR-/HER2- -Completed  dose dense Adriamycin, Cytoxan every 2 weeks, with Neulasta support, for 4 cycles in 1/18 -Now s/p surgery and XRT - I reviewed echos personally. EF and Doppler parameters stable. No HF on exam. . I explained incidence of Adriamycin cardiotoxicity in detail include small possibility of very delayed toxicity.    2. neuromyelitis optica spectrum disorder (NOSD) -followed at [facility]  3. DVT/PE, post-op - Continue Xarelto  Glori Bickers, MD  2:23 PM

## 2016-11-19 NOTE — Progress Notes (Signed)
  Echocardiogram 2D Echocardiogram has been performed.  Grace Moyer 11/19/2016, 1:43 PM

## 2016-11-19 NOTE — Addendum Note (Signed)
Encounter addended by: Effie Berkshire, RN on: 11/19/2016  2:31 PM<BR>    Actions taken: Sign clinical note

## 2016-11-19 NOTE — Patient Instructions (Signed)
Follow up as needed

## 2016-11-24 DIAGNOSIS — Z23 Encounter for immunization: Secondary | ICD-10-CM | POA: Diagnosis not present

## 2016-11-26 NOTE — Progress Notes (Signed)
  Radiation Oncology         [phone] ________________________________  Name: Grace Moyer MRN: [id number]  Date: 10/27/2016  DOB: [birth year]  End of Treatment Note  Diagnosis:  Stage IIB, T2N1M0 triple negative invasive ductal carcinoma of the left breast.        ICD-10-CM   1. Malignant neoplasm of upper-outer quadrant of left breast in female, estrogen receptor positive ([place]) C50.412    Z17.0     Indication for treatment:  Curative     Radiation treatment dates:   09/11/2016 to 10/27/2016  Site/dose:    1. The Left breast was treated to 50.4 Gy in 28 fractions at 1.8 Gy per fraction. 2. The Left Sclav was treated to 50.4 Gy in 28 fractions at 1.8 Gy per fraction. 3. The Left breast was boosted to 10 Gy in 5 fractions at 2 Gy per fraction.   Beams/energy:    1. 3D // 10X, 15X 2. photon // 15X, 6X  Narrative: The patient tolerated radiation treatment relatively well.   She developed mild erythema within the treatment field without any moist desquamation. Using Radiaplex as directed and using neosporin under inframammary fold where skin has peeled.   Plan: The patient has completed radiation treatment. The patient will return to radiation oncology clinic for routine followup in one month. I advised them to call or return sooner if they have any questions or concerns related to their recovery or treatment.  ------------------------------------------------  Jodelle Gross, MD, PhD  This document serves as a record of services personally performed by Kyung Rudd, MD. It was created on his behalf by Arlyce Harman, a trained medical scribe. The creation of this record is based on the scribe's personal observations and the provider's statements to them. This document has been checked and approved by the attending provider.

## 2016-12-01 ENCOUNTER — Telehealth: Payer: Self-pay | Admitting: Hematology

## 2016-12-01 NOTE — Telephone Encounter (Signed)
Faxed records to [facility] risk adjustment [phone] °

## 2016-12-03 DIAGNOSIS — Z923 Personal history of irradiation: Secondary | ICD-10-CM | POA: Diagnosis not present

## 2016-12-03 DIAGNOSIS — C50412 Malignant neoplasm of upper-outer quadrant of left female breast: Secondary | ICD-10-CM | POA: Diagnosis not present

## 2016-12-03 DIAGNOSIS — Z9889 Other specified postprocedural states: Secondary | ICD-10-CM | POA: Diagnosis not present

## 2016-12-15 ENCOUNTER — Encounter: Payer: Self-pay | Admitting: Radiation Oncology

## 2016-12-15 ENCOUNTER — Ambulatory Visit
Admission: RE | Admit: 2016-12-15 | Discharge: 2016-12-15 | Disposition: A | Discharge status: Home / Self Care | Payer: 59 | Source: Ambulatory Visit | Attending: Radiation Oncology | Admitting: Radiation Oncology

## 2016-12-15 VITALS — BP 119/70 | HR 74 | Temp 98.1°F | Resp 20 | Ht 63.0 in | Wt 192.8 lb

## 2016-12-15 DIAGNOSIS — Z79899 Other long term (current) drug therapy: Secondary | ICD-10-CM | POA: Diagnosis not present

## 2016-12-15 DIAGNOSIS — Z825 Family history of asthma and other chronic lower respiratory diseases: Secondary | ICD-10-CM | POA: Insufficient documentation

## 2016-12-15 DIAGNOSIS — Z87891 Personal history of nicotine dependence: Secondary | ICD-10-CM | POA: Insufficient documentation

## 2016-12-15 DIAGNOSIS — Z800 Family history of malignant neoplasm of digestive organs: Secondary | ICD-10-CM | POA: Insufficient documentation

## 2016-12-15 DIAGNOSIS — Z90710 Acquired absence of both cervix and uterus: Secondary | ICD-10-CM | POA: Insufficient documentation

## 2016-12-15 DIAGNOSIS — Z803 Family history of malignant neoplasm of breast: Secondary | ICD-10-CM | POA: Diagnosis not present

## 2016-12-15 DIAGNOSIS — Z9889 Other specified postprocedural states: Secondary | ICD-10-CM | POA: Diagnosis not present

## 2016-12-15 DIAGNOSIS — Z170 Estrogen receptor positive status [ER+]: Secondary | ICD-10-CM | POA: Diagnosis not present

## 2016-12-15 DIAGNOSIS — Z8249 Family history of ischemic heart disease and other diseases of the circulatory system: Secondary | ICD-10-CM | POA: Insufficient documentation

## 2016-12-15 DIAGNOSIS — Z171 Estrogen receptor negative status [ER-]: Secondary | ICD-10-CM

## 2016-12-15 DIAGNOSIS — Z801 Family history of malignant neoplasm of trachea, bronchus and lung: Secondary | ICD-10-CM | POA: Diagnosis not present

## 2016-12-15 DIAGNOSIS — Z818 Family history of other mental and behavioral disorders: Secondary | ICD-10-CM | POA: Diagnosis not present

## 2016-12-15 DIAGNOSIS — C50412 Malignant neoplasm of upper-outer quadrant of left female breast: Secondary | ICD-10-CM

## 2016-12-15 DIAGNOSIS — Z510 Encounter for antineoplastic radiation therapy: Secondary | ICD-10-CM | POA: Diagnosis not present

## 2016-12-15 DIAGNOSIS — Z885 Allergy status to narcotic agent status: Secondary | ICD-10-CM | POA: Diagnosis not present

## 2016-12-15 DIAGNOSIS — Z820 Family history of epilepsy and other diseases of the nervous system: Secondary | ICD-10-CM | POA: Insufficient documentation

## 2016-12-15 NOTE — Progress Notes (Signed)
Radiation Oncology         [phone] ________________________________  Name: Grace Moyer MRN: [id number]  Date of Service: 12/15/2016  DOB: September 10, 1960  Post Treatment Note  CC: Maisie Fus, MD  Truitt Merle, MD  Diagnosis:   Stage IIB, T2N1M0 triple negative invasive ductal carcinoma of the left breast.      Interval Since Last Radiation:  7 weeks   09/11/2016 to 10/27/2016: 1. The Left breast was treated to 50.4 Gy in 28 fractions at 1.8 Gy per fraction. 2. The Left Sclav was treated to 50.4 Gy in 28 fractions at 1.8 Gy per fraction. 3. The Left breast was boosted to 10 Gy in 5 fractions at 2 Gy per fraction.    Narrative:  The patient returns today for routine follow-up. During treatment she did very well with radiotherapy and did not have significant desquamation.                             On review of systems, the patient states she's doing well. She denies any concerns with her skin at this time. No other complaints are noted.  ALLERGIES:  is allergic to hydrocodone.  Meds: Current Outpatient Prescriptions  Medication Sig Dispense Refill  . Cholecalciferol (VITAMIN D-1000 MAX ST) 1000 units tablet Take 2 tablets by mouth daily.    . rivaroxaban (XARELTO) 20 MG TABS tablet Take 1 tablet (20 mg total) by mouth daily with supper. 30 tablet 3  . oxyCODONE (OXY IR/ROXICODONE) 5 MG immediate release tablet Take 1-2 tablets (5-10 mg total) by mouth every 4 (four) hours as needed (for pain score of 1-4). (Patient not taking: Reported on 09/09/2016) 30 tablet 0   No current facility-administered medications for this encounter.    Facility-Administered Medications Ordered in Other Encounters  Medication Dose Route Frequency Provider Last Rate Last Dose  . heparin lock flush 100 unit/mL  500 Units Intravenous Once PRN Truitt Merle, MD      . sodium chloride flush (NS) 0.9 % injection 10 mL  10 mL Intracatheter PRN Truitt Merle, MD   10 mL at 06/18/16 1540  . sodium chloride flush  (NS) 0.9 % injection 10 mL  10 mL Intravenous PRN Truitt Merle, MD      . sodium chloride flush (NS) 0.9 % injection 10 mL  10 mL Intravenous PRN Truitt Merle, MD   10 mL at 06/25/16 1106    Physical Findings:  height is 5\' 3"  (1.6 m) and weight is 192 lb 12.8 oz (87.5 kg). Her oral temperature is 98.1 F (36.7 C). Her blood pressure is 119/70 and her pulse is 74. Her respiration is 20 and oxygen saturation is 100%.  Pain Assessment Pain Score: 0-No pain/10 In general this is a well appearing caucasian female in no acute distress. She's alert and oriented x4 and appropriate throughout the examination. Cardiopulmonary assessment is negative for acute distress and she exhibits normal effort. The left breast was examined and reveals only very mild hyperpigmentation of the medial breast along the sternal region. No desquamation is noted.   Lab Findings: Lab Results  Component Value Date   WBC 3.8 (L) 11/07/2016   HGB 12.9 11/07/2016   HCT 38.5 11/07/2016   MCV 87.9 11/07/2016   PLT 173 11/07/2016     Radiographic Findings: No results found.  Impression/Plan: 1. Stage IIB, T2N1M0 triple negative invasive ductal carcinoma of the left breast. The patient has  been doing well since completion of radiotherapy. We discussed that we would be happy to continue to follow her as needed, but she will also continue to follow up with Dr. Burr Medico in medical oncology. She was counseled on skin care as well as measures to avoid sun exposure to this area.  2. Survivorship. Information about the after breast cancer class and activities around the cancer center were provided to the patient as well as information about her survivorship appointment later in the year.     Carola Rhine, PAC

## 2016-12-30 NOTE — Progress Notes (Signed)
[place]  [phone] [phone]  Clinic Follow up Note   Patient Care Team: Maisie Fus, MD as PCP - General (Obstetrics and Gynecology) Collene Gobble, MD as Attending Physician (Psychiatry) Rolm Bookbinder, MD as Consulting Physician (General Surgery) Truitt Merle, MD as Consulting Physician (Hematology) Kyung Rudd, MD as Consulting Physician (Radiation Oncology) 12/31/2016   CHIEF COMPLAINTS:  Follow up left breast triple negative cancer and PE   Oncology History   Cancer Staging Malignant neoplasm of upper-outer quadrant of left female breast [facility]) Staging form: Breast, AJCC 7th Edition - Clinical stage from 01/29/2016: Stage IIB (T2, N1, M0) - Signed by Truitt Merle, MD on 02/06/2016 - Pathologic stage from 07/31/2016: T0, N0, cM0 - Signed by Truitt Merle, MD on 12/31/2016       Malignant neoplasm of upper-outer quadrant of left female breast ([place])   01/28/2016 Mammogram    Diagnostic MM and US showed a 2.9cm (3.2X2.2X2.5cm on [place]) mass in [place], multiple enlarged left axillary nodes, largest 2.5cm.       01/29/2016 Initial Diagnosis    Malignant neoplasm of upper-outer quadrant of left female breast ([place])      01/29/2016 Initial Biopsy    Left breast mass and axillary node biopsy showed IDC, G3,       01/29/2016 Receptors her2    ER-, PR-, HER2-, Ki67 85%      02/11/2016 Imaging    Bilateral breast MRI with and without contrast showed a 3.3 x 2.6 x 2.6 cm mass in the upper outer left breast, and left axillary lymphadenopathy, at least 3 abnormal lymph nodes, no other additional sites of concern.      02/12/2016 Imaging    CT chest, abdomen and pelvis with contrast showed a 2.4 x 2.7 cm lesion in the upper outer left breast, left axillary node metastasis measuring up to 1.3 cm, no evidence of distant metastasis.      02/12/2016 Imaging    Bone scan was negative for skeletal metastasis.      02/13/2016 - 07/03/2016  Neo-Adjuvant Chemotherapy    Dose dense Adriamycin 60 mg/m, Cytoxan 600 mg/m, every 2 weeks, for 4 cycles, followed by weekly carboplatin and Taxol for 12 weeks       03/27/2016 Genetic Testing    Patient has genetic testing done for personal history of breast cancer, family history of cancer. ATM c.2606C>T VUS identified on the Breast/GYN panel.  Negative genetic testing for the MSH2 inversion analysis (Boland inversion). The Breast/GYN gene panel offered by [facility] includes sequencing and rearrangement analysis for the following 23 genes:  ATM, BARD1, BRCA1, BRCA2, BRIP1, CDH1, CHEK2, EPCAM, FANCC, MLH1, MSH2, MSH6, MUTYH, NBN, NF1, PALB2, PMS2, POLD1, PTEN, RAD51C, RAD51D, RECQL, and TP53.         07/11/2016 Imaging    Bilateral Breast MRI FINDINGS: Breast composition: c. Heterogeneous fibroglandular tissue.  Background parenchymal enhancement: Minimal.  Right breast: No mass or abnormal enhancement.  Left breast: No mass or abnormal enhancement. Biopsy clip is identified at the posterior left breast upper outer quadrant. The previously noted enhancing mass is not seen currently.  Lymph nodes: No abnormal appearing lymph nodes. Previously noted abnormal lymph nodes in the left axilla are currently normal size.  Ancillary findings:  None.  IMPRESSION: Known cancer.      07/31/2016 Pathology Results    Diagnosis 1. Breast, lumpectomy, Left - LOBULAR NEOPLASIA (ATYPICAL LOBULAR HYPERPLASIA). - FIBROCYSTIC CHANGES WITH ADENOSIS AND CALCIFICATIONS. - [place]. -  SEE ONCOLOGY TABLE BELOW. 2. Breast, excision, Left additional Medial Margin - LOBULAR NEOPLASIA (ATYPICAL LOBULAR HYPERPLASIA). - FIBROCYSTIC CHANGES WITH ADENOSIS AND CALCIFICATIONS. - RADIAL SCAR. - HEALING BIOPSY SITE. - SEE COMMENT. 3. Lymph node, sentinel, biopsy, Left - THERE IS NO EVIDENCE OF CARCINOMA IN 1 OF 1 LYMPH NODE (0/1) - CHANGES CONSISTENT WITH PRIOR PROCEDURE. 4. Lymph  node, sentinel, biopsy, Left - THERE IS NO EVIDENCE OF CARCINOMA IN 1 OF 1 LYMPH NODE (0/1) 5. Lymph node, sentinel, biopsy, Left - THERE IS NO EVIDENCE OF CARCINOMA IN 1 OF 1 LYMPH NODE (0/1) 6. Lymph node, sentinel, biopsy, Left - THERE IS NO EVIDENCE OF CARCINOMA IN 1 OF 1 LYMPH NODE (0/1) 7. Lymph node, sentinel, biopsy, Left - THERE IS NO EVIDENCE OF CARCINOMA IN 1 OF 1 LYMPH NODE (0/1)      07/31/2016 Surgery    LEFT BREAST RADIOACTIVE SEED GUIDED LUMPECTOMY WITH LEFT RADIOACTIVE SEED TARGETED AXILLARY SENTINEL LYMPH NODE EXCISION AND SENTINEL LYMPH NODE BIOPSY and port removal done by Dr. Donne Hazel.        08/05/2016 Pathology Results    Surgical pathology  Diagnosis 1. Breast, Mammoplasty, Left - SCLEROSING ADENOSIS WITH CALCIFICATIONS - FIBROCYSTIC CHANGES - DUCT ECTASIA - NO MALIGNANCY IDENTIFIED 2. Breast, Mammoplasty, Right - SCLEROSING ADENOSIS WITH CALCIFICATIONS - FIBROCYSTIC CHANGES - DUCT ECTASIA - NO MALIGNANCY IDENTIFIED      08/05/2016 Surgery    LEFT ONCOPLASTIC REDUCTION; RIGHT BREAST REDUCTION by Dr. Iran Planas       09/11/2016 - 10/27/2016 Radiation Therapy    Radiation with Dr. Lisbeth Renshaw  Radiation treatment dates:   09/11/2016 to 10/27/2016  Site/dose:    1. The Left breast was treated to 50.4 Gy in 28 fractions at 1.8 Gy per fraction. 2. The Left Sclav was treated to 50.4 Gy in 28 fractions at 1.8 Gy per fraction. 3. The Left breast was boosted to 10 Gy in 5 fractions at 2 Gy per fraction.   Beams/energy:    1. 3D // 10X, 15X 2. photon // 15X, 6X  Narrative: The patient tolerated radiation treatment relatively well.  She developed mild erythema within the treatment field without any moist desquamation. Using Radiaplex as directed and using neosporin under inframammary fold where skin has peeled.       HISTORY OF PRESENTING ILLNESS (02/06/16) :  Grace Moyer 56 y.o. female is here because of her recently diagnosed left breast cancer. She was  accompanied By her husband to our multidisciplinary breast clinic today.  This was discovered by screening mammogram, she had no palpable breast mass or axilla note. She denies any other new symptoms. She underwent a diagnostic mammogram and ultrasound on 01/28/2016, which showed a 2.9 cm mass in the upper outer quadrant, and multiple enlarged left axillary nodes, largest 2.5 cm. She underwent core needle biopsy of the left breast mass and left axillary node, both showed invasive ductal carcinoma, grade 3, triple negative, Ki-67 85%.  She developed bilateral numbness and tingling of her legs and hands at the end of May 2017. She underwent a multiple scans, and lab test, and was finally seen by neurologist Dr. Moshe Cipro, and was diagnosed with neuromyelitis optica spectrum disorder (NOSD). She initially received high-dose steroids, and then started Rituxan infusion, status post 2 infusions, now is on every four-month schedule, next due in Jan 2018. She states her neuropathy has been stable, she denies any significant vision problem, or other neurological symptoms. Her hand function and gait are normal. She is  an Clinical biochemist.   GYN HISTORY  Menarchal: 13 LMP: 65 (hysterectomy) Contraceptive: 3 years  HRT: n/a  G1P1: 34 yo daughter   CURRENT THERAPY:  1. Rituxan injection every 4 months for NOSD 2. Xarelto started in 08/1026. Continue until 01/2017, then change to baby aspirin once daily.    INTERIM HISTORY  Mrs Carberry returns for follow-up post radiation. She presents to the clinic today and reports she tolerated radiation well and she is recovering from her treatment. She still has neuropathy in her fingers and toes but she is still functional. She prefers to continue Rituxan in out clinic instead of at [facility]. She will see Dr. Moshe Cipro in March 2019. He may increase dose or frequency at that time as she gets symptoms of persistent hiccups one week before her injections. She has had her flu shot for this  year already.    MEDICAL HISTORY:  Past Medical History:  Diagnosis Date  . Anxiety   . Family history of breast cancer   . Family history of colon cancer   . Malignant neoplasm of upper-outer quadrant of left female breast ([place]) 01/31/2016  . Neuromyelitis optica ([place])     SURGICAL HISTORY: Past Surgical History:  Procedure Laterality Date  . ABDOMINAL HYSTERECTOMY     partial  . BREAST LUMPECTOMY WITH RADIOACTIVE SEED AND SENTINEL LYMPH NODE BIOPSY Left 07/31/2016   Procedure: LEFT BREAST RADIOACTIVE SEED GUIDED LUMPECTOMY WITH LEFT RADIOACTIVE SEED TARGETED AXILLARY SENTINEL LYMPH NODE EXCISION AND SENTINEL LYMPH NODE BIOPSY;  Surgeon: Rolm Bookbinder, MD;  Location: [place];  Service: General;  Laterality: Left;  . BREAST REDUCTION SURGERY Bilateral 08/05/2016   Procedure: LEFT ONCOPLASTIC REDUCTION; RIGHT BREAST REDUCTION;  Surgeon: Irene Limbo, MD;  Location: [place];  Service: Plastics;  Laterality: Bilateral;  . EYE SURGERY    . PORT-A-CATH REMOVAL Right 07/31/2016   Procedure: REMOVAL PORT-A-CATH;  Surgeon: Rolm Bookbinder, MD;  Location: [place];  Service: General;  Laterality: Right;  . PORTACATH PLACEMENT Right 02/11/2016   Procedure: INSERTION PORT-A-CATH WITH [place];  Surgeon: Rolm Bookbinder, MD;  Location: [place];  Service: General;  Laterality: Right;    SOCIAL HISTORY: Social History   Social History  . Marital status: Married    Spouse name: N/A  . Number of children: N/A  . Years of education: N/A   Occupational History  . Not on file.   Social History Main Topics  . Smoking status: Former Smoker    Types: Cigarettes    Quit date: 09/03/2001  . Smokeless tobacco: Never Used  . Alcohol use Yes     Comment: social  . Drug use: No  . Sexual activity: Not on file   Other Topics Concern  . Not on file   Social History Narrative  . No narrative on file    FAMILY  HISTORY: Family History  Problem Relation Age of Onset  . Breast cancer Maternal Grandmother        dx in her 66s  . CAD Paternal Grandmother   . Colon cancer Paternal Uncle        dx in his 35s  . COPD Maternal Grandfather   . CAD Paternal Grandfather   . Breast cancer Cousin        dx in her early to mid 87s; maternal first cousin  . Lung cancer Paternal Uncle     ALLERGIES:  is allergic to hydrocodone.  MEDICATIONS:  Current Outpatient Prescriptions  Medication Sig Dispense Refill  . Cholecalciferol (VITAMIN D-1000 MAX ST) 1000 units tablet Take 2 tablets by mouth daily.    . rivaroxaban (XARELTO) 20 MG TABS tablet Take 1 tablet (20 mg total) by mouth daily with supper. 30 tablet 3   No current facility-administered medications for this visit.    Facility-Administered Medications Ordered in Other Visits  Medication Dose Route Frequency Provider Last Rate Last Dose  . heparin lock flush 100 unit/mL  500 Units Intravenous Once PRN Truitt Merle, MD      . sodium chloride flush (NS) 0.9 % injection 10 mL  10 mL Intracatheter PRN Truitt Merle, MD   10 mL at 06/18/16 1540  . sodium chloride flush (NS) 0.9 % injection 10 mL  10 mL Intravenous PRN Truitt Merle, MD      . sodium chloride flush (NS) 0.9 % injection 10 mL  10 mL Intravenous PRN Truitt Merle, MD   10 mL at 06/25/16 1106    REVIEW OF SYSTEMS:   Constitutional: Denies fevers, chills or abnormal night sweats (+) hair growth Eyes: Denies blurriness of vision, double vision or watery eyes Ears, nose, mouth, throat, and face: Denies mucositis or sore throat Respiratory: Denies cough, dyspnea or wheezes Cardiovascular: Denies palpitation, chest discomfort or lower extremity swelling Gastrointestinal:  Denies nausea, heartburn Skin: negative Lymphatics: Denies new lymphadenopathy or easy bruising Neurological (+) tingling in fingers/toes, neuropathy stable Behavioral/Psych: negative All other systems were reviewed with the patient and  are negative.  PHYSICAL EXAMINATION: ECOG PERFORMANCE STATUS: 1 - Symptomatic but completely ambulatory  Vitals:   12/31/16 1200  BP: 136/75  Pulse: 67  Resp: 18  Temp: 98.4 F (36.9 C)  TempSrc: Oral  SpO2: 100%  Weight: 195 lb 9.6 oz (88.7 kg)  Height: _0  (1.6 m)     GENERAL:alert, no distress and comfortable.  SKIN: skin color, texture, turgor are normal, no rashes, Skin darkness and draining from previous cyst incision In right lower buttock EYES: normal, conjunctiva are pink and non-injected, sclera clear OROPHARYNX:no exudate, no erythema and lips, buccal mucosa, and tongue normal  NECK: supple, thyroid normal size, non-tender, without nodularity. (+) Incision in the right lower neck for port. Port appears clean, neck not significantly swollen, no erythema, no sign of infection. LYMPH:  no palpable lymphadenopathy in the cervical, axillary or inguinal LUNGS: clear to auscultation and percussion with normal breathing effort HEART: regular rate & rhythm and no murmurs and no lower extremity edema ABDOMEN:abdomen soft, non-tender and normal bowel sounds MUSCULOSKELETALl:no cyanosis of digits and no clubbing  PSYCH: alert & oriented x 3 with fluent speech NEURO: no focal motor/sensory deficits Breasts: Breast inspection showed them to be symmetrical with no nipple discharge. (+) s/p left lumpectomy and breast reduction: surgical scar from bilateral breast reduction well healed and left lateral breast incision healed well; no palpable mass or adenopathy, no skin hyperpigmentation   LABORATORY DATA:  I have reviewed the data as listed CBC Latest Ref Rng & Units 12/31/2016 11/07/2016 09/29/2016  WBC 3.9 - 10.3 10e3/uL 3.9 3.8(L) 4.4  Hemoglobin 11.6 - 15.9 g/dL 12.2 12.9 11.7  Hematocrit 34.8 - 46.6 % 36.0 38.5 34.9  Platelets 145 - 400 10e3/uL 165 173 173   CMP Latest Ref Rng & Units 12/31/2016 11/07/2016 09/29/2016  Glucose 70 - 140 mg/dl 103 94 90  BUN 7.0 - 26.0 mg/dL 11.0  17.4 15.0  Creatinine 0.6 - 1.1 mg/dL 0.9 1.0 0.9  Sodium 136 - 145 mEq/L 140  141 141  Potassium 3.5 - 5.1 mEq/L 4.7 4.0 4.1  Chloride 101 - 111 mmol/L - - -  CO2 22 - 29 mEq/L _0 Calcium 8.4 - 10.4 mg/dL 9.5 9.8 9.7  Total Protein 6.4 - 8.3 g/dL 6.8 7.1 6.7  Total Bilirubin 0.20 - 1.20 mg/dL 0.58 0.64 0.47  Alkaline Phos 40 - 150 U/L 124 112 116  AST 5 - 34 U/L 32 26 44(H)  ALT 0 - 55 U/L 42 34 65(H)    PATHOLOGY REPORT   Diagnosis 08/05/16 1. Breast, Mammoplasty, Left - SCLEROSING ADENOSIS WITH CALCIFICATIONS - FIBROCYSTIC CHANGES - DUCT ECTASIA - NO MALIGNANCY IDENTIFIED 2. Breast, Mammoplasty, Right - SCLEROSING ADENOSIS WITH CALCIFICATIONS - FIBROCYSTIC CHANGES - DUCT ECTASIA - NO MALIGNANCY IDENTIFIED  Diagnosis 07/31/16 1. Breast, lumpectomy, Left - LOBULAR NEOPLASIA (ATYPICAL LOBULAR HYPERPLASIA). - FIBROCYSTIC CHANGES WITH ADENOSIS AND CALCIFICATIONS. - [place]. - SEE ONCOLOGY TABLE BELOW. 2. Breast, excision, Left additional Medial Margin - LOBULAR NEOPLASIA (ATYPICAL LOBULAR HYPERPLASIA). - FIBROCYSTIC CHANGES WITH ADENOSIS AND CALCIFICATIONS. - RADIAL SCAR. - HEALING BIOPSY SITE. - SEE COMMENT. 3. Lymph node, sentinel, biopsy, Left - THERE IS NO EVIDENCE OF CARCINOMA IN 1 OF 1 LYMPH NODE (0/1) - CHANGES CONSISTENT WITH PRIOR PROCEDURE. 4. Lymph node, sentinel, biopsy, Left - THERE IS NO EVIDENCE OF CARCINOMA IN 1 OF 1 LYMPH NODE (0/1) 5. Lymph node, sentinel, biopsy, Left - THERE IS NO EVIDENCE OF CARCINOMA IN 1 OF 1 LYMPH NODE (0/1) 6. Lymph node, sentinel, biopsy, Left - THERE IS NO EVIDENCE OF CARCINOMA IN 1 OF 1 LYMPH NODE (0/1) 7. Lymph node, sentinel, biopsy, Left - THERE IS NO EVIDENCE OF CARCINOMA IN 1 OF 1 LYMPH NODE (0/1) Microscopic Comment 1. BREAST, STATUS POST NEOADJUVANT TREATMENT Procedure: Seed localized lumpectomy, additional medial margin resection and multiple lymph node resections. Laterality: Left Tumor  Size: N/A Histologic Type: N/A Grade: N/A Microscopic Comment(continued) Ductal Carcinoma in Situ (DCIS): Not identified. Regional Lymph Nodes: Number of Lymph Nodes Examined: 5 Number of Sentinel Lymph Nodes Examined: 5 Lymph Nodes with Micrometastases: 0 Lymph Nodes with Micrometastases: 0 Lymph Nodes with Isolated Tumor Cells: 0 Margins: N/A Extent of Tumor: N/A Breast Prognostic Profile (pre-neoadjuvant case #: [id number]) Estrogen Receptor: 0%. Progesterone Receptor: less than 1%. Her2: No amplification was detected. Ki-67: 85% Residual Cancer Burden (RCB): This case is considered a complete pathologic response. Pathologic Stage Classification (p TNM, AJCC 8th Edition): Primary Tumor (ypT): ypT0 Regional Lymph Nodes (ypN): ypN0 (JBK:gt, 08/04/16) Comments: The breast tissue and lymph nodal tissue both show significant treatment related c   Diagnosis 01/29/2016 1. Breast, left, needle core biopsy, 1:30 o'clock - INVASIVE DUCTAL CARCINOMA, GRADE 3. - LYMPHOVASCULAR INVOLVEMENT BY TUMOR. 2. Lymph node, needle/core biopsy, left axillary - METASTATIC CARCINOMA.  RADIOGRAPHIC STUDIES: I have personally reviewed the radiological images as listed and agreed with the findings in the report. No results found.  Bone scan 02/12/2016 IMPRESSION: No evidence of skeletal metastatic disease.  CT chest abdomen pelvis w contrast 02/12/2016 IMPRESSION: 2.4 x 2.7 cm lesion in the upper outer left breast, corresponding to the patient's known breast neoplasm. Left axillary nodal metastasis measuring up to 13 mm short axis. No evidence of distant metastasis.  ASSESSMENT & PLAN: 56 y.o. Caucasian woman with past medical history of depression, presented with a screening mammogram discovered left breast cancer   1. Malignant neoplasm of upper-outer quadrant of left breast, invasive ductal carcinoma, G3, cT2N1M0, stage IIB, ER-/PR-/HER2-, ypT0N0 -She  received neoadjuvant chemotherapy  with dose dense Adriamycin, Cytoxan every 2 weeks, with Neulasta support, for 4 cycles, followed by weekly carboplatin and Taxol for 12 weeks she tolerated well overall.,   -She had surgery 07/31/16 and breast reduction 08/05/16. Pathology was discussed with pt previously and showed no residual cancer after neoadjuvant chemotherapy, which is an excellent prognostic factor for good outcome.  -Giving the complete pathologic response, I do not recommend adjuvant Xeloda.  -She underwent radiation with Dr. Lisbeth Renshaw on 09/11/16-10/27/16, tolerated well.  -I previously discussed the surveillance plan, which is a physical exam and lab test (including CBC & CMP) every 3-4 months for the first 2 years, then every 6-12 months for a total of 5 years, diagnotic mammogram once a year.  -She still has residual neuropathy from treatment. I suggest a B complex. She does not want to take Gabapentin.  -She is clinically doing well. Her CBC and CMP are WNL, Her breast exam was unremarkable. There is no concern for recurrence.  -I discussed symptoms which may be associated cancer recurrence, she knows to watch and contact us if needed. She does not need routine whole body scans unless there is clinical concerns for recurrence.   -Her mammogram will be 01/19/17, and will continue once a year -She has had her flu shot for this year.  -F/u in 4 months with lab     2. Left LE DVT and bilateral acute submassive PE, provoked by surgeries admitted 08/15/16 -She developed extensive left lower extremity DVT with bilateral acute submassive PE, requiring hospitalization -This is probably provoked by her breast surgery and inmobility -I recommend her to remain on Xarelto for 6 months. Then remain on baby aspirin. She is tolerating Xarelto well. Precautions for fall and injury were discussed with patient. -She will complete Xarelto in 01/2017 and then proceed with baby aspirin once daily.   3. Genetics -Given her young age and triple  negative disease, we recommend her to have genetic testing to rule out inheritable breast cancer syndrome, and the test result may impact her surgery. -ATM c.2606C>T VUS identified on the Breast/Gyn panel.  Negative genetic testing for the MSH2 inversion analysis ([place]and inversion). The Breast/GYN gene panel offered by [facility] includes sequencing and rearrangement analysis for the following 23 genes:  ATM, BARD1, BRCA1, BRCA2, BRIP1, CDH1, CHEK2, EPCAM, FANCC, MLH1, MSH2, MSH6, MUTYH, NBN, NF1, PALB2, PMS2, POLD1, PTEN, RAD51C, RAD51D, RECQL, and TP53.   The report date is March 27, 2016  4. neuromyelitis optica spectrum disorder (NOSD) -she will continue follow up with her neurologist Dr. Moshe Cipro at [facility]  -she is on rituximab every 4 weeks. It will be OK to continue her Rituximab when she is on chemotherapy, but the combination therapy will further compromise her immune system. -I have previously spoken with Dr. Moshe Cipro about her breast cancer treatment, he is agreeable. -She previously received Rituxan in our cancer center on 03/21/2016, tolerated generally well. She prefers to get next dose Rituxan here with [place], which she received on 11/07/16 -She will continue to receive Rituxan injection through our clinic every 4 weeks  4. Peripheral neuropathy, G1 -Her tingling and numbness got slightly worse towards the end of chemotherapy, likely related to her chemotherapy -No need for Neurontin at this point, continue monitoring. -She still has residual neuropathy from treatment. She does not want to take Gabapentin. I suggest a B complex.   5. Anemia secondary to chemo -overall mild and stable -We'll monitor closely -resolved now   Plan -  next Rituxan the first Friday in Jan 2019 -Lab and f/u in 4 months  -Continue Xarelto until end of 01/2017, then change to Aspirin 33m daily  -Mammogram in 01/2017   No orders of the defined types were placed in this encounter.   All questions were answered.  The patient knows to call the clinic with any problems, questions or concerns.  I spent 20 minutes counseling the patient face to face. The total time spent in the appointment was 25 minutes and more than 50% was on counseling.  This document serves as a record of services personally performed by YTruitt Merle MD. It was created on her behalf by AJoslyn Devon a trained medical scribe. The creation of this record is based on the scribe's personal observations and the provider's statements to them. This document has been checked and approved by the attending provider.     FTruitt Merle MD 12/31/2016

## 2016-12-31 ENCOUNTER — Telehealth: Payer: Self-pay

## 2016-12-31 ENCOUNTER — Ambulatory Visit (HOSPITAL_BASED_OUTPATIENT_CLINIC_OR_DEPARTMENT_OTHER): Payer: 59 | Admitting: Hematology

## 2016-12-31 ENCOUNTER — Other Ambulatory Visit (HOSPITAL_BASED_OUTPATIENT_CLINIC_OR_DEPARTMENT_OTHER): Payer: 59

## 2016-12-31 ENCOUNTER — Encounter: Payer: Self-pay | Admitting: Hematology

## 2016-12-31 VITALS — BP 136/75 | HR 67 | Temp 98.4°F | Resp 18 | Ht 63.0 in | Wt 195.6 lb

## 2016-12-31 DIAGNOSIS — I82402 Acute embolism and thrombosis of unspecified deep veins of left lower extremity: Secondary | ICD-10-CM

## 2016-12-31 DIAGNOSIS — C50412 Malignant neoplasm of upper-outer quadrant of left female breast: Secondary | ICD-10-CM

## 2016-12-31 DIAGNOSIS — I2699 Other pulmonary embolism without acute cor pulmonale: Secondary | ICD-10-CM | POA: Diagnosis not present

## 2016-12-31 DIAGNOSIS — G360 Neuromyelitis optica [Devic]: Secondary | ICD-10-CM | POA: Diagnosis not present

## 2016-12-31 DIAGNOSIS — G620 Drug-induced polyneuropathy: Secondary | ICD-10-CM | POA: Diagnosis not present

## 2016-12-31 DIAGNOSIS — T451X5A Adverse effect of antineoplastic and immunosuppressive drugs, initial encounter: Secondary | ICD-10-CM | POA: Insufficient documentation

## 2016-12-31 DIAGNOSIS — Z171 Estrogen receptor negative status [ER-]: Secondary | ICD-10-CM | POA: Diagnosis not present

## 2016-12-31 DIAGNOSIS — Z7901 Long term (current) use of anticoagulants: Secondary | ICD-10-CM

## 2016-12-31 DIAGNOSIS — Z86718 Personal history of other venous thrombosis and embolism: Secondary | ICD-10-CM | POA: Insufficient documentation

## 2016-12-31 LAB — COMPREHENSIVE METABOLIC PANEL
ALT: 42 U/L (ref 0–55)
AST: 32 U/L (ref 5–34)
Albumin: 3.9 g/dL (ref 3.5–5.0)
Alkaline Phosphatase: 124 U/L (ref 40–150)
Anion Gap: 8 mEq/L (ref 3–11)
BUN: 11 mg/dL (ref 7.0–26.0)
CO2: 25 mEq/L (ref 22–29)
Calcium: 9.5 mg/dL (ref 8.4–10.4)
Chloride: 107 mEq/L (ref 98–109)
Creatinine: 0.9 mg/dL (ref 0.6–1.1)
EGFR: >60 mL/min/{1.73_m2} (ref >60)
Glucose: 103 mg/dl (ref 70–140)
Potassium: 4.7 mEq/L (ref 3.5–5.1)
Sodium: 140 mEq/L (ref 136–145)
Total Bilirubin: 0.58 mg/dL (ref 0.20–1.20)
Total Protein: 6.8 g/dL (ref 6.4–8.3)

## 2016-12-31 LAB — CBC WITH DIFFERENTIAL/PLATELET
BASO%: 0.5 % (ref 0.0–2.0)
Basophils Absolute: 0 10*3/uL (ref 0.0–0.1)
EOS%: 3.1 % (ref 0.0–7.0)
Eosinophils Absolute: 0.1 10*3/uL (ref 0.0–0.5)
HCT: 36 % (ref 34.8–46.6)
HGB: 12.2 g/dL (ref 11.6–15.9)
LYMPH%: 17.1 % (ref 14.0–49.7)
MCH: 30 pg (ref 25.1–34.0)
MCHC: 33.9 g/dL (ref 31.5–36.0)
MCV: 88.5 fL (ref 79.5–101.0)
MONO#: 0.3 10*3/uL (ref 0.1–0.9)
MONO%: 8.3 % (ref 0.0–14.0)
NEUT#: 2.8 10*3/uL (ref 1.5–6.5)
NEUT%: 71 % (ref 38.4–76.8)
Platelets: 165 10*3/uL (ref 145–400)
RBC: 4.07 10*6/uL (ref 3.70–5.45)
RDW: 13.5 % (ref 11.2–14.5)
WBC: 3.9 10*3/uL (ref 3.9–10.3)
lymph#: 0.7 10*3/uL — ABNORMAL LOW (ref 0.9–3.3)

## 2016-12-31 NOTE — Telephone Encounter (Signed)
Printed avs and calender for upcoming appointment in January and February. Per 10/31 los

## 2017-01-19 ENCOUNTER — Ambulatory Visit
Admission: RE | Admit: 2017-01-19 | Discharge: 2017-01-19 | Disposition: A | Discharge status: Home / Self Care | Payer: 59 | Source: Ambulatory Visit | Attending: Obstetrics & Gynecology | Admitting: Obstetrics & Gynecology

## 2017-01-19 DIAGNOSIS — Z853 Personal history of malignant neoplasm of breast: Secondary | ICD-10-CM

## 2017-01-19 DIAGNOSIS — Z01419 Encounter for gynecological examination (general) (routine) without abnormal findings: Secondary | ICD-10-CM | POA: Diagnosis not present

## 2017-01-19 DIAGNOSIS — R922 Inconclusive mammogram: Secondary | ICD-10-CM | POA: Diagnosis not present

## 2017-01-19 HISTORY — DX: Malignant neoplasm of unspecified site of unspecified female breast: C50.919

## 2017-01-19 HISTORY — DX: Personal history of irradiation: Z92.3

## 2017-01-19 HISTORY — DX: Personal history of antineoplastic chemotherapy: Z92.21

## 2017-03-06 ENCOUNTER — Ambulatory Visit (HOSPITAL_BASED_OUTPATIENT_CLINIC_OR_DEPARTMENT_OTHER): Payer: 59

## 2017-03-06 ENCOUNTER — Ambulatory Visit (HOSPITAL_BASED_OUTPATIENT_CLINIC_OR_DEPARTMENT_OTHER): Payer: 59 | Admitting: Medical

## 2017-03-06 VITALS — BP 105/59 | HR 66 | Temp 98.4°F | Resp 16

## 2017-03-06 DIAGNOSIS — L539 Erythematous condition, unspecified: Secondary | ICD-10-CM | POA: Diagnosis not present

## 2017-03-06 DIAGNOSIS — R0989 Other specified symptoms and signs involving the circulatory and respiratory systems: Secondary | ICD-10-CM | POA: Diagnosis not present

## 2017-03-06 DIAGNOSIS — R05 Cough: Secondary | ICD-10-CM | POA: Diagnosis not present

## 2017-03-06 DIAGNOSIS — T451X5A Adverse effect of antineoplastic and immunosuppressive drugs, initial encounter: Secondary | ICD-10-CM

## 2017-03-06 DIAGNOSIS — C50412 Malignant neoplasm of upper-outer quadrant of left female breast: Secondary | ICD-10-CM | POA: Diagnosis not present

## 2017-03-06 DIAGNOSIS — Z5112 Encounter for antineoplastic immunotherapy: Secondary | ICD-10-CM

## 2017-03-06 DIAGNOSIS — G360 Neuromyelitis optica [Devic]: Secondary | ICD-10-CM | POA: Diagnosis not present

## 2017-03-06 MED ORDER — SODIUM CHLORIDE 0.9 % IV SOLN
Freq: Once | INTRAVENOUS | Status: AC
  Administered 2017-03-06: 10:00:00 via INTRAVENOUS

## 2017-03-06 MED ORDER — FAMOTIDINE IN NACL 20-0.9 MG/50ML-% IV SOLN
20.0000 mg | Freq: Once | INTRAVENOUS | Status: DC | PRN
  Administered 2017-03-06: 20 mg via INTRAVENOUS

## 2017-03-06 MED ORDER — FAMOTIDINE IN NACL 20-0.9 MG/50ML-% IV SOLN: 20.0000 mg | Freq: Once | INTRAVENOUS | Status: DC | PRN

## 2017-03-06 MED ORDER — DIPHENHYDRAMINE HCL 50 MG/ML IJ SOLN
25.0000 mg | Freq: Once | INTRAMUSCULAR | Status: AC | PRN
  Administered 2017-03-06: 25 mg via INTRAVENOUS

## 2017-03-06 MED ORDER — DIPHENHYDRAMINE HCL 50 MG/ML IJ SOLN
25.0000 mg | Freq: Once | INTRAMUSCULAR | Status: AC
  Administered 2017-03-06: 25 mg via INTRAVENOUS

## 2017-03-06 MED ORDER — ACETAMINOPHEN 325 MG PO TABS
ORAL_TABLET | ORAL | Status: AC
Start: 2017-03-06 — End: ?
  Filled 2017-03-06: qty 2

## 2017-03-06 MED ORDER — METHYLPREDNISOLONE SODIUM SUCC 125 MG IJ SOLR
125.0000 mg | Freq: Once | INTRAMUSCULAR | Status: AC | PRN
  Administered 2017-03-06: 125 mg via INTRAVENOUS

## 2017-03-06 MED ORDER — ACETAMINOPHEN 325 MG PO TABS
650.0000 mg | ORAL_TABLET | Freq: Once | ORAL | Status: AC
  Administered 2017-03-06: 650 mg via ORAL

## 2017-03-06 MED ORDER — DIPHENHYDRAMINE HCL 50 MG/ML IJ SOLN
INTRAMUSCULAR | Status: AC
Start: 2017-03-06 — End: ?
  Filled 2017-03-06: qty 1

## 2017-03-06 MED ORDER — SODIUM CHLORIDE 0.9 % IV SOLN
500.0000 mg | Freq: Once | INTRAVENOUS | Status: AC
  Administered 2017-03-06: 500 mg via INTRAVENOUS
  Filled 2017-03-06: qty 50

## 2017-03-06 NOTE — Patient Instructions (Signed)
[facility] Discharge Instructions for Patients Receiving Chemotherapy  Today you received the following chemotherapy agents:  Rituxan.  To help prevent nausea and vomiting after your treatment, we encourage you to take your nausea medication as directed.   If you develop nausea and vomiting that is not controlled by your nausea medication, call the clinic.   BELOW ARE SYMPTOMS THAT SHOULD BE REPORTED IMMEDIATELY:  *FEVER GREATER THAN 100.5 F  *CHILLS WITH OR WITHOUT FEVER  NAUSEA AND VOMITING THAT IS NOT CONTROLLED WITH YOUR NAUSEA MEDICATION  *UNUSUAL SHORTNESS OF BREATH  *UNUSUAL BRUISING OR BLEEDING  TENDERNESS IN MOUTH AND THROAT WITH OR WITHOUT PRESENCE OF ULCERS  *URINARY PROBLEMS  *BOWEL PROBLEMS  UNUSUAL RASH Items with * indicate a potential emergency and should be followed up as soon as possible.  Feel free to call the clinic should you have any questions or concerns. The clinic phone number is [phone].  Please show the CHEMO ALERT CARD at check-in to the Emergency Department and triage nurse.   

## 2017-03-06 NOTE — Progress Notes (Signed)
Pt tolerated remainder of infusion and reached max rate of 400 mg/hr (240 mL/hr). VS remined stable remainder of infusion and pt remained asymptomatic.

## 2017-03-06 NOTE — Progress Notes (Signed)
Symptoms Management Clinic Progress Note   Grace Moyer [id number] 09/22/1960 57 y.o.  Grace Moyer is managed by Dr. 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 She was receiving rituximab at the time of her reaction. She had just started her infusion of rituximab prior to onset of symptoms. Her symptoms included: Cough, erythema, and a scratchy throat She was premedicated with Benadryl 25 mg IV and Tylenol 650 mg p.o. prior to starting chemotherapy. Rituximab was paused and Grace Moyer was given Pepcid 20 mg IV and Benadryl 25 mg IV after onset of her symptoms. Grace Moyer did  respond to intervention.  The patient was able to receive the remainder of her rituximab without complications.    Please see After Visit Summary for patient specific instructions.  Future Appointments  Date Time Provider [place]  03/30/2017 10:00 AM Gardenia Phlegm, NP [facility] None  04/29/2017  2:45 PM [facility] LAB 3 [facility] None  04/29/2017  3:30 PM Truitt Merle, MD [facility] None    No orders of the defined types were placed in this encounter.      Subjective:   Patient ID:  Grace Moyer is a 57 y.o. (DOB 07/29/1960) female.  Chief Complaint: No chief complaint on file.   HPI Grace Moyer was seen in the infusion room for a suspected reaction to chemotherapy. She was receiving rituximab at the time of her reaction. She had just started her infusion of rituximab prior to onset of symptoms. Her symptoms included: Cough, erythema, and a scratchy throat. She was premedicated with Benadryl 25 mg IV and Tylenol 650 mg p.o. prior to starting chemotherapy. Rituximab was  paused and Grace Moyer was given Pepcid 20 mg IV and Benadryl 25 mg IV after onset of her symptoms. Grace Moyer did  respond to intervention.  The patient was able to receive the remainder of her rituximab without complications.  Medications: I have reviewed the patient's current medications.  Allergies:  Allergies  Allergen Reactions  . Hydrocodone Other (See Comments)    Did not help with pain and kept her up all night    Past Medical History:  Diagnosis Date  . Anxiety   . Breast cancer ([place])   . Family history of breast cancer   . Family history of colon cancer   . Malignant neoplasm of upper-outer quadrant of left female breast ([place]) 01/31/2016  . Neuromyelitis optica ([place])   . Personal history of chemotherapy   . Personal history of radiation therapy     Past Surgical History:  Procedure Laterality Date  . ABDOMINAL HYSTERECTOMY     partial  . BREAST LUMPECTOMY Left    2018  . BREAST LUMPECTOMY WITH RADIOACTIVE SEED AND SENTINEL LYMPH NODE BIOPSY Left 07/31/2016   Procedure: LEFT BREAST RADIOACTIVE SEED GUIDED LUMPECTOMY WITH LEFT RADIOACTIVE SEED TARGETED AXILLARY SENTINEL LYMPH NODE EXCISION AND SENTINEL LYMPH NODE BIOPSY;  Surgeon: Rolm Bookbinder, MD;  Location: [place];  Service: General;  Laterality: Left;  . BREAST REDUCTION SURGERY Bilateral 08/05/2016   Procedure: LEFT ONCOPLASTIC REDUCTION; RIGHT BREAST REDUCTION;  Surgeon: Irene Limbo, MD;  Location: [place];  Service: Plastics;  Laterality: Bilateral;  . EYE SURGERY    .  PORT-A-CATH REMOVAL Right 07/31/2016   Procedure: REMOVAL PORT-A-CATH;  Surgeon: Rolm Bookbinder, MD;  Location: [place];  Service: General;  Laterality: Right;  . PORTACATH PLACEMENT Right 02/11/2016   Procedure: INSERTION PORT-A-CATH WITH [place];  Surgeon: Rolm Bookbinder, MD;  Location: [place];  Service: General;  Laterality: Right;  . REDUCTION  MAMMAPLASTY      Family History  Problem Relation Age of Onset  . Breast cancer Maternal Grandmother        dx in her 98s  . CAD Paternal Grandmother   . Colon cancer Paternal Uncle        dx in his 42s  . COPD Maternal Grandfather   . CAD Paternal Grandfather   . Breast cancer Cousin        dx in her early to mid 62s; maternal first cousin  . Lung cancer Paternal Uncle     Social History   Socioeconomic History  . Marital status: Married    Spouse name: Not on file  . Number of children: Not on file  . Years of education: Not on file  . Highest education level: Not on file  Social Needs  . Financial resource strain: Not on file  . Food insecurity - worry: Not on file  . Food insecurity - inability: Not on file  . Transportation needs - medical: Not on file  . Transportation needs - non-medical: Not on file  Occupational History  . Not on file  Tobacco Use  . Smoking status: Former Smoker    Types: Cigarettes    Last attempt to quit: 09/03/2001    Years since quitting: 15.5  . Smokeless tobacco: Never Used  Substance and Sexual Activity  . Alcohol use: Yes    Comment: social  . Drug use: No  . Sexual activity: Not on file  Other Topics Concern  . Not on file  Social History Narrative  . Not on file    Past Medical History, Surgical history, Social history, and Family history were reviewed and updated as appropriate.   Please see review of systems for further details on the patient's review from today.   Review of Systems:  Review of Systems  HENT:       Scratchy throat  Respiratory: Positive for cough.   Skin: Positive for color change (Erythema of the face and neck).    Objective:   Physical Exam:  There were no vitals taken for this visit. ECOG: 0  Physical Exam  Constitutional: No distress.  HENT:  Head: Normocephalic and atraumatic.  Cardiovascular: Normal rate, regular rhythm and normal heart sounds. Exam reveals no gallop and no friction  rub.  No murmur heard. Pulmonary/Chest: Effort normal and breath sounds normal. No respiratory distress. She has no wheezes. She has no rales.  Neurological: She is alert.  Skin: Skin is warm and dry. She is not diaphoretic. There is erythema (Face is mildly erythematous).    Lab Review:     Component Value Date/Time   NA 140 12/31/2016 1121   K 4.7 12/31/2016 1121   CL 99 (L) 08/17/2016 0615   CO2 25 12/31/2016 1121   GLUCOSE 103 12/31/2016 1121   BUN 11.0 12/31/2016 1121   CREATININE 0.9 12/31/2016 1121   CALCIUM 9.5 12/31/2016 1121   PROT 6.8 12/31/2016 1121   ALBUMIN 3.9 12/31/2016 1121   AST 32 12/31/2016 1121   ALT 42 12/31/2016 1121   ALKPHOS 124 12/31/2016 1121   BILITOT 0.58  12/31/2016 1121   GFRNONAA >60 08/17/2016 0615   GFRAA >60 08/17/2016 0615       Component Value Date/Time   WBC 3.9 12/31/2016 1121   WBC 5.3 08/18/2016 0235   RBC 4.07 12/31/2016 1121   RBC 2.80 (L) 08/18/2016 0235   HGB 12.2 12/31/2016 1121   HCT 36.0 12/31/2016 1121   PLT 165 12/31/2016 1121   MCV 88.5 12/31/2016 1121   MCH 30.0 12/31/2016 1121   MCH 30.4 08/18/2016 0235   MCHC 33.9 12/31/2016 1121   MCHC 33.1 08/18/2016 0235   RDW 13.5 12/31/2016 1121   LYMPHSABS 0.7 (L) 12/31/2016 1121   MONOABS 0.3 12/31/2016 1121   EOSABS 0.1 12/31/2016 1121   BASOSABS 0.0 12/31/2016 1121   -------------------------------  Imaging from last 24 hours (if applicable):  Radiology interpretation: No results found.      This case was discussed with Dr. Burr Medico. She expressed agreement with my management of this patient.

## 2017-03-06 NOTE — Progress Notes (Signed)
10:03 pt administer pre medications of 25 IV benadryl and 6501 tylenol.   10:52- Rituxan infusion started at 100 mh/hr (42mL/hr)  11:05- pt complaint of scratchy throat, flushed feeling, and cough.  Infusion of Rituxan stopped, NS wide open, Lucianne Lei, [place] [facility] notified and orders received to administer 25 mg IV benadryl and IV Pepcid given.  VSS and documented in epic  11:15 pt states all symptoms have resolved  11:42- Rituxan restarted at 100 mg/hr (60 mL/hr for 30 mLs)  12:12- after completion of the first 30 minutes, pt BP 95/50.  Rituxan infusion paused and [employer], Pa notified.  Verbal orders received to administer 125 mg IV solumedrol and to re start Rituxan at initial rate in 15 minutes.   12:38- pt BP increased to 107/49 and infusion restarted at initial rate of 100 mg/hr (60 mL/hr) for 30 minutes.

## 2017-03-17 ENCOUNTER — Telehealth (HOSPITAL_COMMUNITY): Payer: Self-pay | Admitting: *Deleted

## 2017-03-17 NOTE — Telephone Encounter (Signed)
Received medical record request from [facility].  Requested records faxed today to [phone].  Original request will be scanned to patient's electronic medical record.

## 2017-03-24 ENCOUNTER — Telehealth: Payer: Self-pay | Admitting: *Deleted

## 2017-03-24 DIAGNOSIS — D2271 Melanocytic nevi of right lower limb, including hip: Secondary | ICD-10-CM | POA: Diagnosis not present

## 2017-03-24 DIAGNOSIS — D485 Neoplasm of uncertain behavior of skin: Secondary | ICD-10-CM | POA: Diagnosis not present

## 2017-03-24 DIAGNOSIS — L989 Disorder of the skin and subcutaneous tissue, unspecified: Secondary | ICD-10-CM | POA: Diagnosis not present

## 2017-03-24 DIAGNOSIS — L723 Sebaceous cyst: Secondary | ICD-10-CM | POA: Diagnosis not present

## 2017-03-24 DIAGNOSIS — D2262 Melanocytic nevi of left upper limb, including shoulder: Secondary | ICD-10-CM | POA: Diagnosis not present

## 2017-03-24 NOTE — Telephone Encounter (Signed)
Pt called with c/o "new lump" to her left breast x1wk. Relate tender and painful, near surgical scar. Physician team notified. Pt scheduled to see Dr. Donne Hazel on 1/24 at 2:45. Pt aware of appt date and time. Denies further needs or questions at this time.

## 2017-03-25 ENCOUNTER — Telehealth: Payer: Self-pay

## 2017-03-25 NOTE — Telephone Encounter (Signed)
Spoke with patient reminding her of SCP visit on 03/30/17 at 10 am.  Patient says she will be at appt.

## 2017-03-26 DIAGNOSIS — C50412 Malignant neoplasm of upper-outer quadrant of left female breast: Secondary | ICD-10-CM | POA: Diagnosis not present

## 2017-03-30 ENCOUNTER — Encounter: Payer: Self-pay | Admitting: Adult Health

## 2017-03-30 ENCOUNTER — Other Ambulatory Visit: Payer: Self-pay | Admitting: Adult Health

## 2017-03-30 ENCOUNTER — Inpatient Hospital Stay: Payer: 59 | Attending: Adult Health | Admitting: Adult Health

## 2017-03-30 ENCOUNTER — Other Ambulatory Visit: Payer: Self-pay | Admitting: *Deleted

## 2017-03-30 VITALS — BP 113/65 | HR 69 | Temp 98.6°F | Resp 18 | Ht 63.0 in | Wt 206.8 lb

## 2017-03-30 DIAGNOSIS — C50412 Malignant neoplasm of upper-outer quadrant of left female breast: Secondary | ICD-10-CM | POA: Diagnosis not present

## 2017-03-30 DIAGNOSIS — C773 Secondary and unspecified malignant neoplasm of axilla and upper limb lymph nodes: Secondary | ICD-10-CM

## 2017-03-30 DIAGNOSIS — Z9221 Personal history of antineoplastic chemotherapy: Secondary | ICD-10-CM | POA: Diagnosis not present

## 2017-03-30 DIAGNOSIS — Z923 Personal history of irradiation: Secondary | ICD-10-CM | POA: Insufficient documentation

## 2017-03-30 DIAGNOSIS — Z171 Estrogen receptor negative status [ER-]: Secondary | ICD-10-CM | POA: Insufficient documentation

## 2017-03-30 DIAGNOSIS — M2560 Stiffness of unspecified joint, not elsewhere classified: Secondary | ICD-10-CM

## 2017-03-30 MED ORDER — ASPIRIN 81 MG PO TABS: 81.0000 mg | ORAL_TABLET | Freq: Every day | ORAL | Status: AC

## 2017-03-30 NOTE — Progress Notes (Signed)
CLINIC:  Survivorship   REASON FOR VISIT:  Routine follow-up post-treatment for a recent history of breast cancer.  BRIEF ONCOLOGIC HISTORY:  Oncology History   Cancer Staging Malignant neoplasm of upper-outer quadrant of left female breast [facility]) Staging form: Breast, AJCC 7th Edition - Clinical stage from 01/29/2016: Stage IIB (T2, N1, M0) - Signed by Truitt Merle, MD on 02/06/2016 - Pathologic stage from 07/31/2016: T0, N0, cM0 - Signed by Truitt Merle, MD on 12/31/2016       Malignant neoplasm of upper-outer quadrant of left female breast ([place])   01/28/2016 Mammogram    Diagnostic MM and US showed a 2.9cm (3.2X2.2X2.5cm on [place]) mass in UOQ, multiple enlarged left axillary nodes, largest 2.5cm.       01/29/2016 Initial Diagnosis    Malignant neoplasm of upper-outer quadrant of left female breast ([place])      01/29/2016 Initial Biopsy    Left breast mass and axillary node biopsy showed IDC, G3,       01/29/2016 Receptors her2    ER-, PR-, HER2-, Ki67 85%      02/11/2016 Imaging    Bilateral breast MRI with and without contrast showed a 3.3 x 2.6 x 2.6 cm mass in the upper outer left breast, and left axillary lymphadenopathy, at least 3 abnormal lymph nodes, no other additional sites of concern.      02/12/2016 Imaging    CT chest, abdomen and pelvis with contrast showed a 2.4 x 2.7 cm lesion in the upper outer left breast, left axillary node metastasis measuring up to 1.3 cm, no evidence of distant metastasis.      02/12/2016 Imaging    Bone scan was negative for skeletal metastasis.      02/13/2016 - 07/03/2016 Neo-Adjuvant Chemotherapy    Dose dense Adriamycin 60 mg/m, Cytoxan 600 mg/m, every 2 weeks, for 4 cycles, followed by weekly carboplatin and Taxol for 12 weeks       03/27/2016 Genetic Testing    Patient has genetic testing done for personal history of breast cancer, family history of cancer. ATM [id number] VUS identified on the Breast/GYN panel.  Negative  genetic testing for the MSH2 inversion analysis ([place] inversion). The Breast/GYN gene panel offered by [facility] includes sequencing and rearrangement analysis for the following 23 genes:  ATM, BARD1, BRCA1, BRCA2, BRIP1, CDH1, CHEK2, EPCAM, FANCC, MLH1, MSH2, MSH6, MUTYH, NBN, NF1, PALB2, PMS2, POLD1, PTEN, RAD51C, RAD51D, RECQL, and TP53.         07/11/2016 Imaging    Bilateral Breast MRI FINDINGS: Breast composition: c. Heterogeneous fibroglandular tissue.  Background parenchymal enhancement: Minimal.  Right breast: No mass or abnormal enhancement.  Left breast: No mass or abnormal enhancement. Biopsy clip is identified at the posterior left breast upper outer quadrant. The previously noted enhancing mass is not seen currently.  Lymph nodes: No abnormal appearing lymph nodes. Previously noted abnormal lymph nodes in the left axilla are currently normal size.  Ancillary findings:  None.  IMPRESSION: Known cancer.      07/31/2016 Pathology Results    Diagnosis 1. Breast, lumpectomy, Left - LOBULAR NEOPLASIA (ATYPICAL LOBULAR HYPERPLASIA). - FIBROCYSTIC CHANGES WITH ADENOSIS AND CALCIFICATIONS. - [place]. - SEE ONCOLOGY TABLE BELOW. 2. Breast, excision, Left additional Medial Margin - LOBULAR NEOPLASIA (ATYPICAL LOBULAR HYPERPLASIA). - FIBROCYSTIC CHANGES WITH ADENOSIS AND CALCIFICATIONS. - RADIAL SCAR. - HEALING BIOPSY SITE. - SEE COMMENT. 3. Lymph node, sentinel, biopsy, Left - THERE IS NO EVIDENCE OF CARCINOMA IN 1 OF 1 LYMPH  NODE (0/1) - CHANGES CONSISTENT WITH PRIOR PROCEDURE. 4. Lymph node, sentinel, biopsy, Left - THERE IS NO EVIDENCE OF CARCINOMA IN 1 OF 1 LYMPH NODE (0/1) 5. Lymph node, sentinel, biopsy, Left - THERE IS NO EVIDENCE OF CARCINOMA IN 1 OF 1 LYMPH NODE (0/1) 6. Lymph node, sentinel, biopsy, Left - THERE IS NO EVIDENCE OF CARCINOMA IN 1 OF 1 LYMPH NODE (0/1) 7. Lymph node, sentinel, biopsy, Left - THERE IS NO EVIDENCE OF  CARCINOMA IN 1 OF 1 LYMPH NODE (0/1)      07/31/2016 Surgery    LEFT BREAST RADIOACTIVE SEED GUIDED LUMPECTOMY WITH LEFT RADIOACTIVE SEED TARGETED AXILLARY SENTINEL LYMPH NODE EXCISION AND SENTINEL LYMPH NODE BIOPSY and port removal done by Dr. Donne Hazel.        08/05/2016 Pathology Results    Surgical pathology  Diagnosis 1. Breast, Mammoplasty, Left - SCLEROSING ADENOSIS WITH CALCIFICATIONS - FIBROCYSTIC CHANGES - DUCT ECTASIA - NO MALIGNANCY IDENTIFIED 2. Breast, Mammoplasty, Right - SCLEROSING ADENOSIS WITH CALCIFICATIONS - FIBROCYSTIC CHANGES - DUCT ECTASIA - NO MALIGNANCY IDENTIFIED      08/05/2016 Surgery    LEFT ONCOPLASTIC REDUCTION; RIGHT BREAST REDUCTION by Dr. Iran Planas       09/11/2016 - 10/27/2016 Radiation Therapy    Radiation with Dr. Lisbeth Renshaw  Radiation treatment dates:   09/11/2016 to 10/27/2016  Site/dose:    1. The Left breast was treated to 50.4 Gy in 28 fractions at 1.8 Gy per fraction. 2. The Left Sclav was treated to 50.4 Gy in 28 fractions at 1.8 Gy per fraction. 3. The Left breast was boosted to 10 Gy in 5 fractions at 2 Gy per fraction.   Beams/energy:    1. 3D // 10X, 15X 2. photon // 15X, 6X  Narrative: The patient tolerated radiation treatment relatively well.  She developed mild erythema within the treatment field without any moist desquamation. Using Radiaplex as directed and using neosporin under inframammary fold where skin has peeled.        INTERVAL HISTORY:  Ms. Forquer presents to the [facility] today for our initial meeting to review her survivorship care plan detailing her treatment course for breast cancer, as well as monitoring long-term side effects of that treatment, education regarding health maintenance, screening, and overall wellness and health promotion.     Overall, Ms. Skalsky reports feeling quite well.  She admits she had a scare last week, and thought she felt a lump.  She saw Dr. Donne Hazel and he attributed it to  scar tissue.  She has been having some arthritic concerns in her hands.  She says it is bilateral.  She said that Dr. Donne Hazel recommended physical therapy for her due to tightness in left arm range of motion.  She also thinks she may have some tendonitis in her right arm.     REVIEW OF SYSTEMS:  Review of Systems  Constitutional: Negative for appetite change, chills, fatigue, fever and unexpected weight change.  HENT:   Negative for hearing loss, lump/mass, mouth sores, sore throat and trouble swallowing.   Eyes: Negative for eye problems and icterus.  Respiratory: Negative for chest tightness, cough and shortness of breath.   Cardiovascular: Negative for chest pain, leg swelling and palpitations.  Gastrointestinal: Negative for abdominal distention, abdominal pain, constipation, diarrhea, nausea and vomiting.  Endocrine: Negative for hot flashes.  Genitourinary: Negative for difficulty urinating.   Skin: Negative for itching and rash.  Neurological: Negative for dizziness, extremity weakness, headaches and numbness.  Hematological: Negative for adenopathy.  Does not bruise/bleed easily.  Psychiatric/Behavioral: Negative for depression. The patient is not nervous/anxious.   Breast: Denies any new nodularity, masses, tenderness, nipple changes, or nipple discharge.      ONCOLOGY TREATMENT TEAM:  1. Surgeon:  Dr[facility] Donne [facility]el at [facility] 2. Medical Oncologist: Dr. Burr Medico  3. Radiation Oncologist: Dr. Lisbeth Renshaw    PAST MEDICAL/SURGICAL HISTORY:  Past Medical History:  Diagnosis Date  . Anxiety   . Breast cancer ([place])   . Family history of breast cancer   . Family history of colon cancer   . Malignant neoplasm of upper-outer quadrant of left female breast ([place]) 01/31/2016  . Neuromyelitis optica ([place])   . Personal history of chemotherapy   . Personal history of radiation therapy    Past Surgical History:  Procedure Laterality Date  . ABDOMINAL HYSTERECTOMY     partial    . BREAST LUMPECTOMY Left    2018  . BREAST LUMPECTOMY WITH RADIOACTIVE SEED AND SENTINEL LYMPH NODE BIOPSY Left 07/31/2016   Procedure: LEFT BREAST RADIOACTIVE SEED GUIDED LUMPECTOMY WITH LEFT RADIOACTIVE SEED TARGETED AXILLARY SENTINEL LYMPH NODE EXCISION AND SENTINEL LYMPH NODE BIOPSY;  Surgeon: Rolm Bookbinder, MD;  Location: [place];  Service: General;  Laterality: Left;  . BREAST REDUCTION SURGERY Bilateral 08/05/2016   Procedure: LEFT ONCOPLASTIC REDUCTION; RIGHT BREAST REDUCTION;  Surgeon: Irene Limbo, MD;  Location: [place];  Service: Plastics;  Laterality: Bilateral;  . EYE SURGERY    . PORT-A-CATH REMOVAL Right 07/31/2016   Procedure: REMOVAL PORT-A-CATH;  Surgeon: Rolm Bookbinder, MD;  Location: [place];  Service: General;  Laterality: Right;  . PORTACATH PLACEMENT Right 02/11/2016   Procedure: INSERTION PORT-A-CATH WITH [place];  Surgeon: Rolm Bookbinder, MD;  Location: [place];  Service: General;  Laterality: Right;  . REDUCTION MAMMAPLASTY       ALLERGIES:  Allergies  Allergen Reactions  . Hydrocodone Other (See Comments)    Did not help with pain and kept her up all night     CURRENT MEDICATIONS:  Outpatient Encounter Medications as of 03/30/2017  Medication Sig  . Cholecalciferol (VITAMIN D-1000 MAX ST) 1000 units tablet Take 2 tablets by mouth daily.  . rivaroxaban (XARELTO) 20 MG TABS tablet Take 1 tablet (20 mg total) by mouth daily with supper.   Facility-Administered Encounter Medications as of 03/30/2017  Medication  . heparin lock flush 100 unit/mL  . sodium chloride flush (NS) 0.9 % injection 10 mL  . sodium chloride flush (NS) 0.9 % injection 10 mL  . sodium chloride flush (NS) 0.9 % injection 10 mL     ONCOLOGIC FAMILY HISTORY:  Family History  Problem Relation Age of Onset  . Breast cancer Maternal Grandmother        dx in her 25s  . CAD Paternal Grandmother   . Colon  cancer Paternal Uncle        dx in his 64s  . COPD Maternal Grandfather   . CAD Paternal Grandfather   . Breast cancer Cousin        dx in her early to mid 57s; maternal first cousin  . Lung cancer Paternal Uncle      GENETIC COUNSELING/TESTING: Negative  SOCIAL HISTORY:  KESLYN TEATER is married and lives with her husband in [place], [place].  She has one daughter who lives at home with Laurena and her husband.  Ms. 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  She denies any current or history of tobacco, alcohol, or illicit drug use.     PHYSICAL EXAMINATION:  Vital Signs:   Vitals:   03/30/17 0958  BP: 113/65  Pulse: 69  Resp: 18  Temp: 98.6 F (37 C)  SpO2: 100%   Filed Weights   03/30/17 0958  Weight: 206 lb 12.8 oz (93.8 kg)   General: Well-nourished, well-appearing female in no acute distress.  She is unaccompanied today.   HEENT: Head is normocephalic.  Pupils equal and reactive to light. Conjunctivae clear without exudate.  Sclerae anicteric. Oral mucosa is pink, moist.  Oropharynx is pink without lesions or erythema.  Lymph: No cervical, supraclavicular, or infraclavicular lymphadenopathy noted on palpation.  Cardiovascular: Regular rate and rhythm.Marland Kitchen Respiratory: Clear to auscultation bilaterally. Chest expansion symmetric; breathing non-labored.  GI: Abdomen soft and round; non-tender, non-distended. Bowel sounds normoactive.  GU: Deferred.  Neuro: No focal deficits. Steady gait.  Psych: Mood and affect normal and appropriate for situation.  Extremities: No edema. MSK: No focal spinal tenderness to palpation.  Slight limitation in left arm ROM, no limitation in right arm ROM. Skin: Warm and dry.  LABORATORY DATA:  None for this visit.  DIAGNOSTIC IMAGING:       ASSESSMENT AND PLAN:  Ms.. Valley is a pleasant 57 y.o. female with Stage IIB left breast invasive ductal carcinoma,  ER-/PR-/HER2-, diagnosed in 01/2016, treated with neo-adjuvant chemotherapy, lumpectomy, and adjuvant radiation therapy.  She presents to the Survivorship Clinic for our initial meeting and routine follow-up post-completion of treatment for breast cancer.    1. Stage IIB /left breast cancer:  Ms. Colgate is continuing to recover from definitive treatment for breast cancer. She will follow-up with her medical oncologist, Dr. Burr Medico in 04/2017 with history and physical exam per surveillance protocol.   Today, a comprehensive survivorship care plan and treatment summary was reviewed with the patient today detailing her breast cancer diagnosis, treatment course, potential late/long-term effects of treatment, appropriate follow-up care with recommendations for the future, and patient education resources.  A copy of this summary, along with a letter will be sent to the patient's primary care provider via mail/fax/In Basket message after today's visit.    2. Bone health:  Given Ms. Wertenberger's age/history of breast cancer , she is at risk for bone demineralization. She has regular follow up and bone density testing with her gynecologist.  We will continue to defer to her GYN regarding her bone health.  She was given education on specific activities to promote bone health.  3. Cancer screening:  Due to Ms. Segundo's history and her age, she should receive screening for skin cancers, colon cancer, and gynecologic cancers.  The information and recommendations are listed on the patient's comprehensive care plan/treatment summary and were reviewed in detail with the patient.    4. Health maintenance and wellness promotion: Ms. Kozlowski was encouraged to consume 5-7 servings of fruits and vegetables per day. We reviewed the "Nutrition Rainbow" handout, as well as the handout "Take Control of Your Health and Reduce Your Cancer Risk" from the [place].  She was also encouraged to engage in moderate to vigorous  exercise for 30 minutes per day most days of the week. We discussed the [employer] fitness program, which is designed for cancer survivors to help them become more physically fit after cancer treatments.  She was instructed to limit her alcohol consumption and continue to abstain from tobacco use.  5. Support services/counseling: It is not uncommon for this period of the patient's cancer care trajectory to be one of many emotions and stressors.  We discussed an opportunity for her to participate in the next session of [facility] ("Finding Your New Normal") support group series designed for patients after they have completed treatment.   Ms. Eddinger was encouraged to take advantage of our many other support services programs, support groups, and/or counseling in coping with her new life as a cancer survivor after completing anti-cancer treatment.  She was offered support today through active listening and expressive supportive counseling.  She was given information regarding our available services and encouraged to contact me with any questions or for help enrolling in any of our support group/programs.    Dispo:   -Return to cancer center for follow up with Dr. Burr Medico in 04/2017  -Mammogram due in 01/2018 -She is welcome to return back to the Survivorship Clinic at any time; no additional follow-up needed at this time.  -Consider referral back to survivorship as a long-term survivor for continued surveillance  A total of (30) minutes of face-to-face time was spent with this patient with greater than 50% of that time in counseling and care-coordination.   Gardenia Phlegm, [place] [phone]   Note: PRIMARY CARE PROVIDER Maisie Fus, [place] [phone]

## 2017-03-31 ENCOUNTER — Telehealth: Payer: Self-pay | Admitting: Adult Health

## 2017-03-31 NOTE — Telephone Encounter (Signed)
No 1/28 los.  °

## 2017-04-02 ENCOUNTER — Other Ambulatory Visit: Payer: Self-pay

## 2017-04-02 ENCOUNTER — Encounter: Payer: Self-pay | Admitting: Physical Therapy

## 2017-04-02 ENCOUNTER — Ambulatory Visit: Payer: 59 | Attending: General Surgery | Admitting: Physical Therapy

## 2017-04-02 DIAGNOSIS — M25512 Pain in left shoulder: Secondary | ICD-10-CM | POA: Diagnosis present

## 2017-04-02 DIAGNOSIS — R293 Abnormal posture: Secondary | ICD-10-CM | POA: Diagnosis present

## 2017-04-02 DIAGNOSIS — G8929 Other chronic pain: Secondary | ICD-10-CM | POA: Insufficient documentation

## 2017-04-02 DIAGNOSIS — M25631 Stiffness of right wrist, not elsewhere classified: Secondary | ICD-10-CM | POA: Diagnosis present

## 2017-04-02 DIAGNOSIS — Z171 Estrogen receptor negative status [ER-]: Secondary | ICD-10-CM | POA: Insufficient documentation

## 2017-04-02 DIAGNOSIS — M25612 Stiffness of left shoulder, not elsewhere classified: Secondary | ICD-10-CM | POA: Diagnosis present

## 2017-04-02 DIAGNOSIS — M25531 Pain in right wrist: Secondary | ICD-10-CM | POA: Diagnosis not present

## 2017-04-02 DIAGNOSIS — C50412 Malignant neoplasm of upper-outer quadrant of left female breast: Secondary | ICD-10-CM | POA: Insufficient documentation

## 2017-04-02 NOTE — Patient Instructions (Addendum)
Patient presents with symptoms that correspond to this. PT is not diagnosing patient but has encouraged her to discuss this possibility with her physician.   De Quervain Tenosynovitis Tendons attach muscles to bones. They also help with joint movements. When tendons become irritated or swollen, it is called tendinitis. The extensor pollicis brevis (EPB) tendon connects the EPB muscle to a bone that is near the base of the thumb. The EPB muscle helps to straighten and extend the thumb. De Quervain tenosynovitis is a condition in which the EPB tendon lining (sheath) becomes irritated, thickened, and swollen. This condition is sometimes called stenosing tenosynovitis. This condition causes pain on the thumb side of the back of the wrist. What are the causes? Causes of this condition include:  Activities that repeatedly cause your thumb and wrist to extend.  A sudden increase in activity or change in activity that affects your wrist.  What increases the risk? This condition is more likely to develop in:  Females.  People who have diabetes.  Women who have recently given birth.  People who are over 35 years of age.  People who do activities that involve repeated hand and wrist motions, such as tennis, racquetball, volleyball, gardening, and taking care of children.  People who do heavy labor.  People who have poor wrist strength and flexibility.  People who do not warm up properly before activities.  What are the signs or symptoms? Symptoms of this condition include:  Pain or tenderness over the thumb side of the back of the wrist when your thumb and wrist are not moving.  Pain that gets worse when you straighten your thumb or extend your thumb or wrist.  Pain when the injured area is touched.  Locking or catching of the thumb joint while you bend and straighten your thumb.  Decreased thumb motion due to pain.  Swelling over the affected area.  How is this diagnosed? This  condition is diagnosed with a medical history and physical exam. Your health care provider will ask for details about your injury and ask about your symptoms. How is this treated? Treatment may include the use of icing and medicines to reduce pain and swelling. You may also be advised to wear a splint or brace to limit your thumb and wrist motion. In less severe cases, treatment may also include working with a physical therapist to strengthen your wrist and calm the irritation around your EPB tendon sheath. In severe cases, surgery may be needed. Follow these instructions at home: If you have a splint or brace:  Wear it as told by your health care provider. Remove it only as told by your health care provider.  Loosen the splint or brace if your fingers become numb and tingle, or if they turn cold and blue.  Keep the splint or brace clean and dry. Managing pain, stiffness, and swelling  If directed, apply ice to the injured area. ? Put ice in a plastic bag. ? Place a towel between your skin and the bag. ? Leave the ice on for 20 minutes, 2-3 times per day.  Move your fingers often to avoid stiffness and to lessen swelling.  Raise (elevate) the injured area above the level of your heart while you are sitting or lying down. General instructions  Return to your normal activities as told by your health care provider. Ask your health care provider what activities are safe for you.  Take over-the-counter and prescription medicines only as told by your health care  provider.  Keep all follow-up visits as told by your health care provider. This is important.  Do not drive or operate heavy machinery while taking prescription pain medicine. Contact a health care provider if:  Your pain, tenderness, or swelling gets worse, even if you have had treatment.  You have numbness or tingling in your wrist, hand, or fingers on the injured side. This information is not intended to replace advice given  to you by your health care provider. Make sure you discuss any questions you have with your health care provider. Document Released: 02/17/2005 Document Revised: 07/26/2015 Document Reviewed: 04/25/2014 Elsevier Interactive Patient Education  2018 [place].   Wrist / Hand Radial / Ulnar Deviation    With fingers together, palm down, move right hand toward thumb with motion at wrist only. Then move toward little finger. Repeat sequence __10__ times per session. Do __2__ sessions per week. Variation: Palms facing each other, move right hand up toward thumb, then down toward little finger.  Copyright  VHI. All rights reserved.

## 2017-04-02 NOTE — Therapy (Signed)
[place], [place], [address] Phone: [phone]   Fax:  [phone]  Physical Therapy Evaluation  Patient Details  Name: Grace Moyer MRN: [id number] Date of Birth: 04-01-1960 Referring Provider: Wilber Bihari, NP   Encounter Date: 04/02/2017  PT End of Session - 04/02/17 0857    Visit Number  1    Number of Visits  8    Date for PT Re-Evaluation  04/30/17    PT Start Time  0800    PT Stop Time  0855    PT Time Calculation (min)  55 min    Activity Tolerance  Patient tolerated treatment well    Behavior During Therapy  [facility] for tasks assessed/performed       Past Medical History:  Diagnosis Date  . Anxiety   . Breast cancer ([place])   . Family history of breast cancer   . Family history of colon cancer   . Malignant neoplasm of upper-outer quadrant of left female breast ([place]) 01/31/2016  . 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 Personal history of chemotherapy   . Personal history of radiation therapy     Past Surgical History:  Procedure Laterality Date  . ABDOMINAL HYSTERECTOMY     partial  . BREAST LUMPECTOMY Left    2018  . BREAST LUMPECTOMY WITH RADIOACTIVE SEED AND SENTINEL LYMPH NODE BIOPSY Left 07/31/2016   Procedure: LEFT BREAST RADIOACTIVE SEED GUIDED LUMPECTOMY WITH LEFT RADIOACTIVE SEED TARGETED AXILLARY SENTINEL LYMPH NODE EXCISION AND SENTINEL LYMPH NODE BIOPSY;  Surgeon: Rolm Bookbinder, MD;  Location: [facility];  Service: General;  Laterality: Left;  . BREAST REDUCTION SURGERY Bilateral 08/05/2016   Procedure: LEFT ONCOPLASTIC REDUCTION; RIGHT BREAST REDUCTION;  Surgeon: Irene Limbo, MD;  Location: [facility];  Service: Plastics;  Laterality: Bilateral;  . EYE SURGERY    . PORT-A-CATH REMOVAL Right 07/31/2016   Procedure: REMOVAL PORT-A-CATH;  Surgeon: Rolm Bookbinder, MD;  Location: [facility];  Service: General;  Laterality:  Right;  . PORTACATH PLACEMENT Right 02/11/2016   Procedure: INSERTION PORT-A-CATH WITH [place];  Surgeon: Rolm Bookbinder, MD;  Location: [facility];  Service: General;  Laterality: Right;  . REDUCTION MAMMAPLASTY      There were no vitals filed for this visit.   Subjective Assessment - 04/02/17 0806    Subjective  Patient reports she is here today to improve her left shoulder ROM. She had a left lumpectomy and 5 axillary nodes removed on 07/31/16 followed by bilateral breast reduction on 08/05/16. She had neoadjuvant chemotherapy and then radiation after surgery. It was triple negative breast cancer. She has also had right wrist pain for the past 4 weeks which appears to be Dequerveins tenosynovitis.    Pertinent History  She had a left lumpectomy and 5 axillary nodes removed on 07/31/16 followed by bilateral breast reduction on 08/05/16. She had neoadjuvant chemotherapy and then radiation after surgery. It was triple negative breast cancer.    Patient Stated Goals  Improve shoulder ROM and decrease right wrist pain    Currently in Pain?  Yes Some c/o tenderness left breast    Pain Score  8     Pain Location  Wrist    Pain Orientation  Right    Pain Descriptors / Indicators  Sharp    Pain Type  Acute pain    Pain Onset  1 to 4 weeks ago    Pain Frequency  Intermittent  Aggravating Factors   Using right hand    Pain Relieving Factors  Keeping right wrist still    Multiple Pain Sites  No         [facility] PT Assessment - 04/02/17 0001      Assessment   Medical Diagnosis  s/p left lumpectomy and SLNB with shoulder ROM deficits    Referring Provider  Wilber Bihari, NP    Onset Date/Surgical Date  07/31/16    Hand Dominance  Right    Prior Therapy  baseline assessments      Precautions   Precautions  Other (comment)    Precaution Comments  Hx PE and DVTs post op; Left breast cancer; lymphedema risk      Restrictions   Weight Bearing Restrictions  No      Balance Screen    Has the patient fallen in the past 6 months  No    Has the patient had a decrease in activity level because of a fear of falling?   No    Is the patient reluctant to leave their home because of a fear of falling?   No      Home Environment   Living Environment  Private residence    Living Arrangements  Spouse/significant other;Children Husband and adult daughter    Available Help at Discharge  Family      Prior Function   Level of Independence  Independent    Vocation  Full time employment    Marine scientist    Leisure  She does not exercise      Cognition   Overall Cognitive Status  Within Functional Limits for tasks assessed      Posture/Postural Control   Posture/Postural Control  Postural limitations    Postural Limitations  Rounded Shoulders;Forward head      ROM / Strength   AROM / PROM / Strength  AROM;Strength      AROM   AROM Assessment Site  Shoulder;Cervical;Wrist    Right/Left Shoulder  Right;Left    Right Shoulder Extension  50 Degrees    Right Shoulder Flexion  147 Degrees    Right Shoulder ABduction  143 Degrees    Right Shoulder Internal Rotation  74 Degrees    Right Shoulder External Rotation  90 Degrees    Left Shoulder Extension  41 Degrees    Left Shoulder Flexion  120 Degrees    Left Shoulder ABduction  96 Degrees    Left Shoulder Internal Rotation  64 Degrees    Left Shoulder External Rotation  79 Degrees    Right/Left Wrist  Right    Right Wrist Extension  52 Degrees    Right Wrist Flexion  54 Degrees    Right Wrist Radial Deviation  22 Degrees    Right Wrist Ulnar Deviation  22 Degrees    Left Wrist Extension  62 Degrees    Left Wrist Flexion  64 Degrees    Left Wrist Radial Deviation  38 Degrees    Left Wrist Ulnar Deviation  46 Degrees      Palpation   Palpation comment  Neural tension left UE; tender to palpation left axilla and pectoralis insertion. Also very tender in right anatomical snuff box and along right  extensor brevis tendon.      Special Tests   Other special tests  Positive right de querveins tenosynovitis test with thumb tucked into palm and ulnar deviation.        LYMPHEDEMA/ONCOLOGY QUESTIONNAIRE -  04/02/17 0854      Type   Cancer Type  Left breast      Surgeries   Lumpectomy Date  07/31/16    Sentinel Lymph Node Biopsy Date  07/31/16    Other Surgery Date  08/05/16 Bilateral breast reduction    Number Lymph Nodes Removed  5      Treatment   Active Chemotherapy Treatment  No    Past Chemotherapy Treatment  Yes    Date  03/03/16 Diagnosed 12/17 and then had neoadjuvant chemotherapy    Active Radiation Treatment  No    Date  --    Past Radiation Treatment  Yes    Date  -- Post op around 7/18    Body Site  Left breast    Current Hormone Treatment  No    Past Hormone Therapy  No      What other symptoms do you have   Are you Having Heaviness or Tightness  Yes    Are you having Pain  Yes    Are you having pitting edema  No    Is it Hard or Difficult finding clothes that fit  No    Do you have infections  No    Is there Decreased scar mobility  Yes    [place] Sign  No      Lymphedema Assessments   Lymphedema Assessments  Upper extremities      Right Upper Extremity Lymphedema   10 cm Proximal to Olecranon Process  33.2 cm    Olecranon Process  26.9 cm    10 cm Proximal to Ulnar Styloid Process  25.8 cm    Just Proximal to Ulnar Styloid Process  17.8 cm    Across Hand at [employer]  20.2 cm    At [place] of 2nd Digit  7 cm      Left Upper Extremity Lymphedema   10 cm Proximal to Olecranon Process  33.1 cm    Olecranon Process  26.3 cm    10 cm Proximal to Ulnar Styloid Process  25.4 cm    Just Proximal to Ulnar Styloid Process  18 cm    Across Hand at [employer]  19.6 cm    At [place] of 2nd Digit  6.7 cm        Quick Dash - 04/02/17 0001    Open a tight or new jar  Mild difficulty    Do heavy household chores (wash walls, wash floors)  No  difficulty    Carry a shopping bag or briefcase  No difficulty    Wash your back  Mild difficulty    Use a knife to cut food  No difficulty    Recreational activities in which you take some force or impact through your arm, shoulder, or hand (golf, hammering, tennis)  No difficulty    During the past week, to what extent has your arm, shoulder or hand problem interfered with your normal social activities with family, friends, neighbors, or groups?  Slightly    During the past week, to what extent has your arm, shoulder or hand problem limited your work or other regular daily activities  Not at all    Arm, shoulder, or hand pain.  Mild    Tingling (pins and needles) in your arm, shoulder, or hand  Moderate    Difficulty Sleeping  Mild difficulty    DASH Score  15.91 %       Objective measurements completed  on examination: See above findings.              PT Education - 04/02/17 0853    Education provided  Yes    Education Details  Ned Card education about condition; wrist ROM exercises and shoulder post op ROM HEP    Person(s) Educated  Patient    Methods  Explanation;Demonstration;Handout    Comprehension  Returned demonstration;Verbalized understanding          PT Long Term Goals - 04/02/17 1039      PT LONG TERM GOAL #1   Title  Patient will demonstrate she is independent with her home exericse program for her righrt wrist and left shoudlder ROM.    Time  4    Period  Weeks    Status  New      PT LONG TERM GOAL #2   Title  Increase right wrist active ulnar deviation to >/= 35 degrees for increased ease of movement.    Time  4    Period  Weeks    Status  New      PT LONG TERM GOAL #3   Title  Increase left shoulder active flexion and abduction to >/= 140 degrees to return to baseline and reach overhead without difficulty.    Time  4    Period  Weeks    Status  New      PT LONG TERM GOAL #4   Title  Patient will report >/= 50% reduction in right wrist  pain to tolerate her job with greater ease.    Time  4    Period  Weeks    Status  New      PT LONG TERM GOAL #5   Title  Patient will verbalize understanding of risk reduction practices related to left arm lymphedema.    Time  4    Period  Weeks    Status  New             Plan - 04/02/17 7782    Clinical Impression Statement  Patient is a very pleasant woan s/p neoadjuvant chemotherapy for triple negative breast cancer, a left lumpectomy and sentinel node biopsy (5 negative nodes) on 07/31/16, a bilateral breast reduction on 08/05/16, and then whole breast radiation. She has residual left axillary and shoulder tightness with neural tension. She also has new left wrist pain which is likely due to overuse and appears to be inflammation of her extensor brevis tendon. She will benefit from PT to reduce right wrist pain and improve ROM and also increase left shoulder ROM and decrease neural tension and pain.    History and Personal Factors relevant to plan of care:  None    Clinical Presentation  Stable    Clinical Decision Making  Low    Rehab Potential  Excellent    PT Frequency  2x / week    PT Duration  4 weeks    PT Treatment/Interventions  ADLs/Self Care Home Management;Moist Heat;Iontophoresis 4mg /ml Dexamethasone;Cryotherapy;Electrical Stimulation;Therapeutic exercise;Therapeutic activities;Patient/family education;Passive range of motion;Taping;Manual techniques I[place], heat and ice for right wrist only    PT Next Visit Plan  ionto if MD approves certification for right wrist, cross friction massage right wrist. PROM left shoulder and ROM exercises; myofascial release to left axilla and chest    PT Home Exercise Plan  Wrist radial deviation ROM and shoulder post op ROM HEP    Consulted and Agree with Plan of Care  Patient  Patient will benefit from skilled therapeutic intervention in order to improve the following deficits and impairments:  Increased fascial restricitons,  Pain, Decreased scar mobility, Postural dysfunction, Impaired UE functional use, Decreased strength, Decreased range of motion, Decreased knowledge of precautions  Visit Diagnosis: Pain in right wrist - Plan: PT plan of care cert/re-cert  Stiffness of right wrist, not elsewhere classified - Plan: PT plan of care cert/re-cert  Carcinoma of upper-outer quadrant of left breast in female, estrogen receptor negative ([place]) - Plan: PT plan of care cert/re-cert  Abnormal posture - Plan: PT plan of care cert/re-cert  Chronic left shoulder pain - Plan: PT plan of care cert/re-cert  Stiffness of left shoulder, not elsewhere classified - Plan: PT plan of care cert/re-cert     Problem List Patient Active Problem List   Diagnosis Date Noted  . Personal history of venous thrombosis and embolism 12/31/2016  . Peripheral neuropathy due to chemotherapy ([place]) 12/31/2016  . Acute pulmonary embolus ([place]) 08/15/2016  . Anemia due to antineoplastic chemotherapy 04/23/2016  . Genetic testing 04/03/2016  . Port catheter in place 03/26/2016  . Family history of breast cancer   . Family history of colon cancer   . 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 Malignant neoplasm of upper-outer quadrant of left female breast ([place]) 01/31/2016  . Bilateral leg numbness 08/14/2015  . Neck pain on left side 08/14/2015  . Abnormal MRI of head 08/14/2015    Annia Friendly, PT 04/02/17 10:44 AM  [place], [place], [address] Phone: [phone]   Fax:  [phone]  Name: LANISA ISHLER MRN: [id number] Date of Birth: Jun 13, 1960

## 2017-04-03 ENCOUNTER — Ambulatory Visit: Payer: 59 | Attending: General Surgery | Admitting: Physical Therapy

## 2017-04-03 ENCOUNTER — Encounter: Payer: Self-pay | Admitting: Physical Therapy

## 2017-04-03 DIAGNOSIS — M25531 Pain in right wrist: Secondary | ICD-10-CM | POA: Diagnosis not present

## 2017-04-03 DIAGNOSIS — G8929 Other chronic pain: Secondary | ICD-10-CM

## 2017-04-03 DIAGNOSIS — M25631 Stiffness of right wrist, not elsewhere classified: Secondary | ICD-10-CM | POA: Diagnosis not present

## 2017-04-03 DIAGNOSIS — R293 Abnormal posture: Secondary | ICD-10-CM | POA: Diagnosis not present

## 2017-04-03 DIAGNOSIS — M25612 Stiffness of left shoulder, not elsewhere classified: Secondary | ICD-10-CM | POA: Insufficient documentation

## 2017-04-03 DIAGNOSIS — M25512 Pain in left shoulder: Secondary | ICD-10-CM | POA: Insufficient documentation

## 2017-04-03 NOTE — Therapy (Signed)
[place], [place], [address] Phone: [phone]   Fax:  [phone]  Physical Therapy Treatment  Patient Details  Name: Grace Moyer MRN: [id number] Date of Birth: 04-18-1960 Referring Provider: Wilber Bihari, NP   Encounter Date: 04/03/2017  PT End of Session - 04/03/17 1159    Visit Number  2    Number of Visits  8    Date for PT Re-Evaluation  04/30/17    PT Start Time  0851    PT Stop Time  0932    PT Time Calculation (min)  41 min    Activity Tolerance  Patient tolerated treatment well    Behavior During Therapy  [facility] for tasks assessed/performed       Past Medical History:  Diagnosis Date  . Anxiety   . Breast cancer ([place])   . Family history of breast cancer   . Family history of colon cancer   . Malignant neoplasm of upper-outer quadrant of left female breast ([place]) 01/31/2016  . Neuromyelitis optica ([place])   . Personal history of chemotherapy   . Personal history of radiation therapy     Past Surgical History:  Procedure Laterality Date  . ABDOMINAL HYSTERECTOMY     partial  . BREAST LUMPECTOMY Left    2018  . BREAST LUMPECTOMY WITH RADIOACTIVE SEED AND SENTINEL LYMPH NODE BIOPSY Left 07/31/2016   Procedure: LEFT BREAST RADIOACTIVE SEED GUIDED LUMPECTOMY WITH LEFT RADIOACTIVE SEED TARGETED AXILLARY SENTINEL LYMPH NODE EXCISION AND SENTINEL LYMPH NODE BIOPSY;  Surgeon: Rolm Bookbinder, MD;  Location: [place];  Service: General;  Laterality: Left;  . BREAST REDUCTION SURGERY Bilateral 08/05/2016   Procedure: LEFT ONCOPLASTIC REDUCTION; RIGHT BREAST REDUCTION;  Surgeon: Irene Limbo, MD;  Location: [place];  Service: Plastics;  Laterality: Bilateral;  . EYE SURGERY    . PORT-A-CATH REMOVAL Right 07/31/2016   Procedure: REMOVAL PORT-A-CATH;  Surgeon: Rolm Bookbinder, MD;  Location: [place];  Service: General;  Laterality:  Right;  . PORTACATH PLACEMENT Right 02/11/2016   Procedure: INSERTION PORT-A-CATH WITH [place];  Surgeon: Rolm Bookbinder, MD;  Location: [place];  Service: General;  Laterality: Right;  . REDUCTION MAMMAPLASTY      There were no vitals filed for this visit.  Subjective Assessment - 04/03/17 0853    Subjective  I feel fine today. My wrist is still bothering me.     Pertinent History  She had a left lumpectomy and 5 axillary nodes removed on 07/31/16 followed by bilateral breast reduction on 08/05/16. She had neoadjuvant chemotherapy and then radiation after surgery. It was triple negative breast cancer.    Patient Stated Goals  Improve shoulder ROM and decrease right wrist pain    Currently in Pain?  Yes    Pain Score  6     Pain Location  Wrist    Pain Orientation  Right    Pain Descriptors / Indicators  Sharp    Pain Type  Acute pain            LYMPHEDEMA/ONCOLOGY QUESTIONNAIRE - 04/02/17 0854      Type   Cancer Type  Left breast      Surgeries   Lumpectomy Date  07/31/16    Sentinel Lymph Node Biopsy Date  07/31/16    Other Surgery Date  08/05/16 Bilateral breast reduction    Number Lymph Nodes Removed  5      Treatment  Active Chemotherapy Treatment  No    Past Chemotherapy Treatment  Yes    Date  03/03/16 Diagnosed 12/17 and then had neoadjuvant chemotherapy    Active Radiation Treatment  No    Date  --    Past Radiation Treatment  Yes    Date  -- Post op around 7/18    Body Site  Left breast    Current Hormone Treatment  No    Past Hormone Therapy  No      What other symptoms do you have   Are you Having Heaviness or Tightness  Yes    Are you having Pain  Yes    Are you having pitting edema  No    Is it Hard or Difficult finding clothes that fit  No    Do you have infections  No    Is there Decreased scar mobility  Yes    [place] Sign  No      Lymphedema Assessments   Lymphedema Assessments  Upper extremities      Right Upper Extremity  Lymphedema   10 cm Proximal to Olecranon Process  33.2 cm    Olecranon Process  26.9 cm    10 cm Proximal to Ulnar Styloid Process  25.8 cm    Just Proximal to Ulnar Styloid Process  17.8 cm    Across Hand at [employer]  20.2 cm    At [place] of 2nd Digit  7 cm      Left Upper Extremity Lymphedema   10 cm Proximal to Olecranon Process  33.1 cm    Olecranon Process  26.3 cm    10 cm Proximal to Ulnar Styloid Process  25.4 cm    Just Proximal to Ulnar Styloid Process  18 cm    Across Hand at [employer]  19.6 cm    At [place] of 2nd Digit  6.7 cm               [facility] Adult PT Treatment/Exercise - 04/03/17 0001      Shoulder Exercises: Supine   Flexion  AAROM;Both;5 reps;Other (comment) with dowel with 10 sec holds    ABduction  AAROM;Left;5 reps;Other (comment) with dowel with 10 sec holds      Iontophoresis   Type of Iontophoresis  Dexamethasone    Location  to R wrist perpendicular to area of pain    Dose  4mg /ml    Time  4-6 hours      Manual Therapy   Manual Therapy  Passive ROM    Passive ROM  to L shoulder in direction of flexion, abduction and ER to pt's tolerance             PT Education - 04/02/17 0853    Education provided  Yes    Education Details  Ned Card education about condition; wrist ROM exercises and shoulder post op ROM HEP    Person(s) Educated  Patient    Methods  Explanation;Demonstration;Handout    Comprehension  Returned demonstration;Verbalized understanding          PT Long Term Goals - 04/02/17 1039      PT LONG TERM GOAL #1   Title  Patient will demonstrate she is independent with her home exericse program for her righrt wrist and left shoudlder ROM.    Time  4    Period  Weeks    Status  New      PT LONG TERM GOAL #2  Title  Increase right wrist active ulnar deviation to >/= 35 degrees for increased ease of movement.    Time  4    Period  Weeks    Status  New      PT LONG TERM GOAL #3   Title  Increase left  shoulder active flexion and abduction to >/= 140 degrees to return to baseline and reach overhead without difficulty.    Time  4    Period  Weeks    Status  New      PT LONG TERM GOAL #4   Title  Patient will report >/= 50% reduction in right wrist pain to tolerate her job with greater ease.    Time  4    Period  Weeks    Status  New      PT LONG TERM GOAL #5   Title  Patient will verbalize understanding of risk reduction practices related to left arm lymphedema.    Time  4    Period  Weeks    Status  New            Plan - 04/03/17 1159    Clinical Impression Statement  Performed cross friction massage today to right wrist and applied ionto stat patch to decrease pain in right wrist. Began PROM to left shoulder which demonstrated improvement by end of session. Issued supine dowel exercises today.     Rehab Potential  Excellent    PT Frequency  2x / week    PT Duration  4 weeks    PT Treatment/Interventions  ADLs/Self Care Home Management;Moist Heat;Iontophoresis 4mg /ml Dexamethasone;Cryotherapy;Electrical Stimulation;Therapeutic exercise;Therapeutic activities;Patient/family education;Passive range of motion;Taping;Manual techniques    PT Next Visit Plan  continue cross friction massage and ionto to right wrist. PROM left shoulder and ROM exercises; myofascial release to left axilla and chest    PT Home Exercise Plan  Wrist radial deviation ROM and shoulder post op ROM HEP, supine dowel exercises    Consulted and Agree with Plan of Care  Patient       Patient will benefit from skilled therapeutic intervention in order to improve the following deficits and impairments:  Increased fascial restricitons, Pain, Decreased scar mobility, Postural dysfunction, Impaired UE functional use, Decreased strength, Decreased range of motion, Decreased knowledge of precautions  Visit Diagnosis: Pain in right wrist  Stiffness of right wrist, not elsewhere classified  Abnormal  posture  Chronic left shoulder pain  Stiffness of left shoulder, not elsewhere classified     Problem List Patient Active Problem List   Diagnosis Date Noted  . Personal history of venous thrombosis and embolism 12/31/2016  . Peripheral neuropathy due to chemotherapy ([place]) 12/31/2016  . Acute pulmonary embolus ([place]) 08/15/2016  . Anemia due to antineoplastic chemotherapy 04/23/2016  . Genetic testing 04/03/2016  . Port catheter in place 03/26/2016  . Family history of breast cancer   . Family history of colon cancer   . 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 Malignant neoplasm of upper-outer quadrant of left female breast ([place]) 01/31/2016  . Bilateral leg numbness 08/14/2015  . Neck pain on left side 08/14/2015  . Abnormal MRI of head 08/14/2015    Allyson Sabal [facility] 04/03/2017, 12:02 PM  [place], [place], [address] Phone: [phone]   Fax:  [phone]  Name: Grace Moyer MRN: [id number] Date of Birth: August 19, 1960  Manus Gunning, PT 04/03/17 12:02 PM

## 2017-04-03 NOTE — Patient Instructions (Signed)
Shoulder: Flexion (Supine)    With hands shoulder width apart, slowly lower dowel to floor behind head. Do not let elbows bend. Keep back flat. Hold _10-30___ seconds. Repeat __10__ times. Do __2__ sessions per day. CAUTION: Stretch slowly and gently.  Copyright  VHI. All rights reserved.  Shoulder: Abduction (Supine)    With right arm flat on floor, hold dowel in palm. Slowly move arm up to side of head by pushing with opposite arm. Do not let elbow bend. Hold _10-30___ seconds. Repeat _10___ times. Do _2___ sessions per day. CAUTION: Stretch slowly and gently.  Copyright  VHI. All rights reserved.   

## 2017-04-07 ENCOUNTER — Ambulatory Visit: Payer: 59

## 2017-04-07 DIAGNOSIS — M25631 Stiffness of right wrist, not elsewhere classified: Secondary | ICD-10-CM

## 2017-04-07 DIAGNOSIS — R293 Abnormal posture: Secondary | ICD-10-CM

## 2017-04-07 DIAGNOSIS — M25612 Stiffness of left shoulder, not elsewhere classified: Secondary | ICD-10-CM

## 2017-04-07 DIAGNOSIS — M25531 Pain in right wrist: Secondary | ICD-10-CM

## 2017-04-07 DIAGNOSIS — G8929 Other chronic pain: Secondary | ICD-10-CM

## 2017-04-07 DIAGNOSIS — M25512 Pain in left shoulder: Secondary | ICD-10-CM

## 2017-04-07 NOTE — Therapy (Signed)
[place], [place], [address] Phone: [phone]   Fax:  [phone]  Physical Therapy Treatment  Patient Details  Name: Grace Moyer MRN: [id number] Date of Birth: 01/03/1961 Referring Provider: Wilber Bihari, NP   Encounter Date: 04/07/2017  PT End of Session - 04/07/17 1150    Visit Number  3    Number of Visits  8    Date for PT Re-Evaluation  04/30/17    PT Start Time  1102    PT Stop Time  1147    PT Time Calculation (min)  45 min    Activity Tolerance  Patient tolerated treatment well    Behavior During Therapy  [facility] for tasks assessed/performed       Past Medical History:  Diagnosis Date  . Anxiety   . Breast cancer ([place])   . Family history of breast cancer   . Family history of colon cancer   . Malignant neoplasm of upper-outer quadrant of left female breast ([place]) 01/31/2016  . Neuromyelitis optica ([place])   . Personal history of chemotherapy   . Personal history of radiation therapy     Past Surgical History:  Procedure Laterality Date  . ABDOMINAL HYSTERECTOMY     partial  . BREAST LUMPECTOMY Left    2018  . BREAST LUMPECTOMY WITH RADIOACTIVE SEED AND SENTINEL LYMPH NODE BIOPSY Left 07/31/2016   Procedure: LEFT BREAST RADIOACTIVE SEED GUIDED LUMPECTOMY WITH LEFT RADIOACTIVE SEED TARGETED AXILLARY SENTINEL LYMPH NODE EXCISION AND SENTINEL LYMPH NODE BIOPSY;  Surgeon: Rolm Bookbinder, MD;  Location: [place];  Service: General;  Laterality: Left;  . BREAST REDUCTION SURGERY Bilateral 08/05/2016   Procedure: LEFT ONCOPLASTIC REDUCTION; RIGHT BREAST REDUCTION;  Surgeon: Irene Limbo, MD;  Location: [place];  Service: Plastics;  Laterality: Bilateral;  . EYE SURGERY    . PORT-A-CATH REMOVAL Right 07/31/2016   Procedure: REMOVAL PORT-A-CATH;  Surgeon: Rolm Bookbinder, MD;  Location: [place];  Service: General;  Laterality:  Right;  . PORTACATH PLACEMENT Right 02/11/2016   Procedure: INSERTION PORT-A-CATH WITH [place];  Surgeon: Rolm Bookbinder, MD;  Location: [place];  Service: General;  Laterality: Right;  . REDUCTION MAMMAPLASTY      There were no vitals filed for this visit.  Subjective Assessment - 04/07/17 1104    Subjective  Didn't notice a difference yet with the patch but it was just my first one last time. My Lt shoulder is actually doing well though, been doing the new exercises and they're helping.     Pertinent History  She had a left lumpectomy and 5 axillary nodes removed on 07/31/16 followed by bilateral breast reduction on 08/05/16. She had neoadjuvant chemotherapy and then radiation after surgery. It was triple negative breast cancer.    Patient Stated Goals  Improve shoulder ROM and decrease right wrist pain    Currently in Pain?  Yes    Pain Score  4     Pain Location  Wrist    Pain Orientation  Right    Pain Descriptors / Indicators  Sharp    Pain Type  Acute pain    Pain Onset  1 to 4 weeks ago    Pain Frequency  Intermittent    Aggravating Factors   not wearing brace, overusing hand    Pain Relieving Factors  wearing brace and limiting ROM  [facility] Adult PT Treatment/Exercise - 04/07/17 0001      Shoulder Exercises: Pulleys   Flexion  2 minutes    Flexion Limitations  Pt returning correct technique after VCs    ABduction  2 minutes    ABduction Limitations  Pt returning correct technique after VCs.      Shoulder Exercises: Therapy Ball   Flexion  10 reps With forward lean into end of stretch    ABduction  10 reps Lt UE with same side lean into end of stretch      Iontophoresis   Type of Iontophoresis  Dexamethasone    Location  to R wrist perpendicular to area of pain    Dose  4mg /ml    Time  4-6 hours      Manual Therapy   Manual Therapy  Passive ROM;Joint mobilization;Soft tissue mobilization    Joint Mobilization  To Rt  wrist in all directions, grades I-II    Soft tissue mobilization  To Rt wrist along tendons lateral and at anterior aspects of wrist; also cross friction massage to lateral tendon where pt c/o pain.    Passive ROM  to Lt shoulder in direction of flexion, abduction and ER to pt's tolerance                  PT Long Term Goals - 04/02/17 1039      PT LONG TERM GOAL #1   Title  Patient will demonstrate she is independent with her home exericse program for her righrt wrist and left shoudlder ROM.    Time  4    Period  Weeks    Status  New      PT LONG TERM GOAL #2   Title  Increase right wrist active ulnar deviation to >/= 35 degrees for increased ease of movement.    Time  4    Period  Weeks    Status  New      PT LONG TERM GOAL #3   Title  Increase left shoulder active flexion and abduction to >/= 140 degrees to return to baseline and reach overhead without difficulty.    Time  4    Period  Weeks    Status  New      PT LONG TERM GOAL #4   Title  Patient will report >/= 50% reduction in right wrist pain to tolerate her job with greater ease.    Time  4    Period  Weeks    Status  New      PT LONG TERM GOAL #5   Title  Patient will verbalize understanding of risk reduction practices related to left arm lymphedema.    Time  4    Period  Weeks    Status  New            Plan - 04/07/17 1153    Clinical Impression Statement  Continued with manual therapy to Rt wrist and Lt shoulder. Also continued with iontophoresis to Rt wrist (#2). Progressed pt to include AA/ROm for Lt shoulder which she tolerated well.     Rehab Potential  Excellent    PT Frequency  2x / week    PT Duration  4 weeks    PT Treatment/Interventions  ADLs/Self Care Home Management;Moist Heat;Iontophoresis 4mg /ml Dexamethasone;Cryotherapy;Electrical Stimulation;Therapeutic exercise;Therapeutic activities;Patient/family education;Passive range of motion;Taping;Manual techniques    PT Next Visit Plan   continue cross friction massage and ionto to right wrist. PROM left shoulder and ROM  exercises; myofascial release to left axilla and chest    Consulted and Agree with Plan of Care  Patient       Patient will benefit from skilled therapeutic intervention in order to improve the following deficits and impairments:  Increased fascial restricitons, Pain, Decreased scar mobility, Postural dysfunction, Impaired UE functional use, Decreased strength, Decreased range of motion, Decreased knowledge of precautions  Visit Diagnosis: Pain in right wrist  Stiffness of right wrist, not elsewhere classified  Abnormal posture  Chronic left shoulder pain  Stiffness of left shoulder, not elsewhere classified     Problem List Patient Active Problem List   Diagnosis Date Noted  . Personal history of venous thrombosis and embolism 12/31/2016  . Peripheral neuropathy due to chemotherapy ([place]) 12/31/2016  . Acute pulmonary embolus ([place]) 08/15/2016  . Anemia due to antineoplastic chemotherapy 04/23/2016  . Genetic testing 04/03/2016  . Port catheter in place 03/26/2016  . Family history of breast cancer   . Family history of colon cancer   . 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 Malignant neoplasm of upper-outer quadrant of left female breast ([place]) 01/31/2016  . Bilateral leg numbness 08/14/2015  . Neck pain on left side 08/14/2015  . Abnormal MRI of head 08/14/2015    Otelia Limes, PTA 04/07/2017, 11:59 AM  [place], [place], [address] Phone: [phone]   Fax:  [phone]  Name: SHILOH SOUTHERN MRN: [id number] Date of Birth: [birth year]

## 2017-04-09 ENCOUNTER — Ambulatory Visit: Payer: 59 | Admitting: Physical Therapy

## 2017-04-09 DIAGNOSIS — M25631 Stiffness of right wrist, not elsewhere classified: Secondary | ICD-10-CM

## 2017-04-09 DIAGNOSIS — G8929 Other chronic pain: Secondary | ICD-10-CM

## 2017-04-09 DIAGNOSIS — M25512 Pain in left shoulder: Secondary | ICD-10-CM

## 2017-04-09 DIAGNOSIS — R293 Abnormal posture: Secondary | ICD-10-CM

## 2017-04-09 DIAGNOSIS — M25531 Pain in right wrist: Secondary | ICD-10-CM | POA: Diagnosis not present

## 2017-04-09 DIAGNOSIS — M25612 Stiffness of left shoulder, not elsewhere classified: Secondary | ICD-10-CM

## 2017-04-09 NOTE — Patient Instructions (Signed)

## 2017-04-09 NOTE — Therapy (Signed)
[place], [place], [address] Phone: [phone]   Fax:  [phone]  Physical Therapy Treatment  Patient Details  Name: Grace Moyer MRN: [id number] Date of Birth: [birth year] Referring Provider: Wilber Bihari, NP   Encounter Date: 04/09/2017  PT End of Session - 04/09/17 1714    Visit Number  4    Number of Visits  8    Date for PT Re-Evaluation  04/30/17    PT Start Time  1600    PT Stop Time  0102    PT Time Calculation (min)  47 min       Past Medical History:  Diagnosis Date  . Anxiety   . Breast cancer ([place])   . Family history of breast cancer   . Family history of colon cancer   . Malignant neoplasm of upper-outer quadrant of left female breast ([place]) 01/31/2016  . Neuromyelitis optica ([place])   . Personal history of chemotherapy   . Personal history of radiation therapy     Past Surgical History:  Procedure Laterality Date  . ABDOMINAL HYSTERECTOMY     partial  . BREAST LUMPECTOMY Left    2018  . BREAST LUMPECTOMY WITH RADIOACTIVE SEED AND SENTINEL LYMPH NODE BIOPSY Left 07/31/2016   Procedure: LEFT BREAST RADIOACTIVE SEED GUIDED LUMPECTOMY WITH LEFT RADIOACTIVE SEED TARGETED AXILLARY SENTINEL LYMPH NODE EXCISION AND SENTINEL LYMPH NODE BIOPSY;  Surgeon: Rolm Bookbinder, MD;  Location: [place];  Service: General;  Laterality: Left;  . BREAST REDUCTION SURGERY Bilateral 08/05/2016   Procedure: LEFT ONCOPLASTIC REDUCTION; RIGHT BREAST REDUCTION;  Surgeon: Irene Limbo, MD;  Location: [facility];  Service: Plastics;  Laterality: Bilateral;  . EYE SURGERY    . PORT-A-CATH REMOVAL Right 07/31/2016   Procedure: REMOVAL PORT-A-CATH;  Surgeon: Rolm Bookbinder, MD;  Location: [facility];  Service: General;  Laterality: Right;  . PORTACATH PLACEMENT Right 02/11/2016   Procedure: INSERTION PORT-A-CATH WITH [place];  Surgeon: Rolm Bookbinder,  MD;  Location: [place];  Service: General;  Laterality: Right;  . REDUCTION MAMMAPLASTY      There were no vitals filed for this visit.  Subjective Assessment - 04/09/17 1707    Subjective  Pt reports her shoulder is doing much better, but she is still having trouble with her wrist She has noticed no difference with use of ionto     Pertinent History  She had a left lumpectomy and 5 axillary nodes removed on 07/31/16 followed by bilateral breast reduction on 08/05/16. She had neoadjuvant chemotherapy and then radiation after surgery. It was triple negative breast cancer.    Patient Stated Goals  Improve shoulder ROM and decrease right wrist pain    Currently in Pain?  Yes    Pain Score  -- no pain currently, but had lots of pain when she woke up this morning     Pain Location  Wrist    Pain Orientation  Right    Pain Descriptors / Indicators  Sharp    Pain Type  Acute pain    Pain Onset  1 to 4 weeks ago    Aggravating Factors   uses hand tools alot at work     Pain Relieving Factors  brace helps     Effect of Pain on Daily Activities  needs to use hand tools at work          [facility] PT Assessment - 04/09/17 0001  AROM   Left Shoulder Flexion  145 Degrees    Left Shoulder ABduction  153 Degrees                  [facility] Adult PT Treatment/Exercise - 04/09/17 0001      Shoulder Exercises: Supine   Other Supine Exercises  supine scapular series x 5 reps with yellow theraband  loops around hand to llimit stress on wrist       Shoulder Exercises: Pulleys   Flexion  2 minutes    ABduction  2 minutes      Shoulder Exercises: Therapy Ball   Flexion  10 reps left arm only     ABduction  10 reps lt arm only       Modalities   Modalities  Cryotherapy      Cryotherapy   Number Minutes Cryotherapy  10 Minutes    Cryotherapy Location  Wrist right    Type of Cryotherapy  Ice pack      Iontophoresis   Type of Iontophoresis  Dexamethasone    Location  to  R wrist     Dose  4mg /ml    Time  4-6 hours      Manual Therapy   Soft tissue mobilization  briefly to left pec tendon    Passive ROM  to Lt shoulder in direction of flexion, abduction and ER to pt's tolerance             PT Education - 04/09/17 1705    Education provided  Yes    Education Details  supine scapular series     Person(s) Educated  Patient    Methods  Explanation;Demonstration;Handout    Comprehension  Verbalized understanding;Returned demonstration          PT Long Term Goals - 04/09/17 1715      PT LONG TERM GOAL #1   Title  Patient will demonstrate she is independent with her home exericse program for her righrt wrist and left shoudlder ROM.    Time  4    Period  Weeks    Status  On-going      PT LONG TERM GOAL #2   Title  Increase right wrist active ulnar deviation to >/= 35 degrees for increased ease of movement.    Time  4    Period  Weeks    Status  On-going      PT LONG TERM GOAL #3   Title  Increase left shoulder active flexion and abduction to >/= 140 degrees to return to baseline and reach overhead without difficulty.    Status  Achieved      PT LONG TERM GOAL #4   Title  Patient will report >/= 50% reduction in right wrist pain to tolerate her job with greater ease.    Time  4    Period  Weeks    Status  On-going      PT LONG TERM GOAL #5   Title  Patient will verbalize understanding of risk reduction practices related to left arm lymphedema.    Time  4    Period  Weeks    Status  On-going            Plan - 04/09/17 1715    Clinical Impression Statement  Pt is doing well with shoulder and have acheived goal for shoulder ROM Upgraded to strengthening program for shoulder and pt is interested in learning Strength ABC to decrease her lymphedema risk.  She  Continues to have wrist pain     Rehab Potential  Excellent    PT Frequency  2x / week    PT Duration  4 weeks    PT Treatment/Interventions  ADLs/Self Care Home  Management;Moist Heat;Iontophoresis 4mg /ml Dexamethasone;Cryotherapy;Electrical Stimulation;Therapeutic exercise;Therapeutic activities;Patient/family education;Passive range of motion;Taping;Manual techniques    PT Next Visit Plan  continue cross friction massage and ionto to right wrist. PROM left shoulder and ROM exercises; myofascial release to left axilla and chest  Teach lymphedema risk reduction and Strength ABC prior to discharge     PT Home Exercise Plan  Wrist radial deviation ROM and shoulder post op ROM HEP, supine dowel exercises, supine scapular series     Consulted and Agree with Plan of Care  Patient       Patient will benefit from skilled therapeutic intervention in order to improve the following deficits and impairments:  Increased fascial restricitons, Pain, Decreased scar mobility, Postural dysfunction, Impaired UE functional use, Decreased strength, Decreased range of motion, Decreased knowledge of precautions  Visit Diagnosis: Pain in right wrist  Stiffness of right wrist, not elsewhere classified  Abnormal posture  Chronic left shoulder pain  Stiffness of left shoulder, not elsewhere classified     Problem List Patient Active Problem List   Diagnosis Date Noted  . Personal history of venous thrombosis and embolism 12/31/2016  . Peripheral neuropathy due to chemotherapy ([place]) 12/31/2016  . Acute pulmonary embolus ([place]) 08/15/2016  . Anemia due to antineoplastic chemotherapy 04/23/2016  . Genetic testing 04/03/2016  . Port catheter in place 03/26/2016  . Family history of breast cancer   . Family history of colon cancer   . 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 Malignant neoplasm of upper-outer quadrant of left female breast ([place]) 01/31/2016  . Bilateral leg numbness 08/14/2015  . Neck pain on left side 08/14/2015  . Abnormal MRI of head 08/14/2015   Donato Heinz. Owens Shark PT  [place] Levo 04/09/2017, 5:20 PM  [place], [place], [address] Phone: [phone]   Fax:  [phone]  Name: Grace Moyer MRN: [id number] Date of Birth: 09/04/1960

## 2017-04-14 ENCOUNTER — Ambulatory Visit: Payer: 59

## 2017-04-14 DIAGNOSIS — M25631 Stiffness of right wrist, not elsewhere classified: Secondary | ICD-10-CM

## 2017-04-14 DIAGNOSIS — M25531 Pain in right wrist: Secondary | ICD-10-CM | POA: Diagnosis not present

## 2017-04-14 DIAGNOSIS — M25612 Stiffness of left shoulder, not elsewhere classified: Secondary | ICD-10-CM

## 2017-04-14 DIAGNOSIS — M25512 Pain in left shoulder: Secondary | ICD-10-CM

## 2017-04-14 DIAGNOSIS — G8929 Other chronic pain: Secondary | ICD-10-CM

## 2017-04-14 DIAGNOSIS — R293 Abnormal posture: Secondary | ICD-10-CM

## 2017-04-14 NOTE — Therapy (Signed)
[place], [place], [address] Phone: [phone]   Fax:  [phone]  Physical Therapy Treatment  Patient Details  Name: Grace Moyer MRN: [id number] Date of Birth: 09/03/1960 Referring Provider: Wilber Bihari, NP   Encounter Date: 04/14/2017  PT End of Session - 04/14/17 0933    Visit Number  5    Number of Visits  8    Date for PT Re-Evaluation  04/30/17    PT Start Time  1950 Pt arrived late    PT Stop Time  0933    PT Time Calculation (min)  38 min    Activity Tolerance  Patient tolerated treatment well    Behavior During Therapy  [facility] for tasks assessed/performed       Past Medical History:  Diagnosis Date  . Anxiety   . Breast cancer ([place])   . Family history of breast cancer   . Family history of colon cancer   . Malignant neoplasm of upper-outer quadrant of left female breast ([place]) 01/31/2016  . Neuromyelitis optica ([place])   . Personal history of chemotherapy   . Personal history of radiation therapy     Past Surgical History:  Procedure Laterality Date  . ABDOMINAL HYSTERECTOMY     partial  . BREAST LUMPECTOMY Left    2018  . BREAST LUMPECTOMY WITH RADIOACTIVE SEED AND SENTINEL LYMPH NODE BIOPSY Left 07/31/2016   Procedure: LEFT BREAST RADIOACTIVE SEED GUIDED LUMPECTOMY WITH LEFT RADIOACTIVE SEED TARGETED AXILLARY SENTINEL LYMPH NODE EXCISION AND SENTINEL LYMPH NODE BIOPSY;  Surgeon: Rolm Bookbinder, MD;  Location: [place];  Service: General;  Laterality: Left;  . BREAST REDUCTION SURGERY Bilateral 08/05/2016   Procedure: LEFT ONCOPLASTIC REDUCTION; RIGHT BREAST REDUCTION;  Surgeon: Irene Limbo, MD;  Location: [place];  Service: Plastics;  Laterality: Bilateral;  . EYE SURGERY    . PORT-A-CATH REMOVAL Right 07/31/2016   Procedure: REMOVAL PORT-A-CATH;  Surgeon: Rolm Bookbinder, MD;  Location: [place];  Service: General;   Laterality: Right;  . PORTACATH PLACEMENT Right 02/11/2016   Procedure: INSERTION PORT-A-CATH WITH [place];  Surgeon: Rolm Bookbinder, MD;  Location: [place];  Service: General;  Laterality: Right;  . REDUCTION MAMMAPLASTY      There were no vitals filed for this visit.  Subjective Assessment - 04/14/17 0858    Subjective  My Lt shoulder is feeling great and I want to learn the ABC exercises Grace Moyer told me about last time. My Rt wrist is unchanged, pain is same. I see Dr. Burr Medico end of this month and once she tells me for sure it's just tendonitis ( I really want to hear it from her that it's not cancer), I'll go see an orthopedist.     Pertinent History  She had a left lumpectomy and 5 axillary nodes removed on 07/31/16 followed by bilateral breast reduction on 08/05/16. She had neoadjuvant chemotherapy and then radiation after surgery. It was triple negative breast cancer.    Patient Stated Goals  Improve shoulder ROM and decrease right wrist pain    Currently in Pain?  Yes    Pain Score  5     Pain Location  Wrist    Pain Orientation  Right    Pain Descriptors / Indicators  Sharp    Pain Type  Acute pain    Pain Onset  1 to 4 weeks ago    Pain Frequency  Intermittent    Aggravating  Factors   overuse of hand tools at work    Pain Relieving Factors  brace helps                      [facility] Adult PT Treatment/Exercise - 04/14/17 0001      Shoulder Exercises: Standing   Other Standing Exercises  Instructed pt in Stength ABC Program through all stretches 1 times each for 15 sec, and strenghtening through squats to performing all 10 times each instructing pt in each for correct technique             PT Education - 04/14/17 0931    Education provided  Yes    Education Details  Began instruction of Strength ABC Program    Person(s) Educated  Patient    Methods  Explanation;Demonstration;Handout          PT Long Term Goals - 04/09/17 1715      PT  LONG TERM GOAL #1   Title  Patient will demonstrate she is independent with her home exericse program for her righrt wrist and left shoudlder ROM.    Time  4    Period  Weeks    Status  On-going      PT LONG TERM GOAL #2   Title  Increase right wrist active ulnar deviation to >/= 35 degrees for increased ease of movement.    Time  4    Period  Weeks    Status  On-going      PT LONG TERM GOAL #3   Title  Increase left shoulder active flexion and abduction to >/= 140 degrees to return to baseline and reach overhead without difficulty.    Status  Achieved      PT LONG TERM GOAL #4   Title  Patient will report >/= 50% reduction in right wrist pain to tolerate her job with greater ease.    Time  4    Period  Weeks    Status  On-going      PT LONG TERM GOAL #5   Title  Patient will verbalize understanding of risk reduction practices related to left arm lymphedema.    Time  4    Period  Weeks    Status  On-going            Plan - 04/14/17 0934    Clinical Impression Statement  Began instruction of Strength ABC Program today which, though challenging due to weakness, pt tolerated well. Modified lunge stretch and quadruoed Superwoman to performing with wrist in neutral to decrease strain to Rt wrist and pt reported this limiting her pain to minimal-none. Forgot iontophoresis today, though pt reports she hasn't noticed any improvement with her pain with this yet. Issued acket and pt to bring back next visit.     Rehab Potential  Excellent    PT Frequency  2x / week    PT Duration  4 weeks    PT Treatment/Interventions  ADLs/Self Care Home Management;Moist Heat;Iontophoresis 4mg /ml Dexamethasone;Cryotherapy;Electrical Stimulation;Therapeutic exercise;Therapeutic activities;Patient/family education;Passive range of motion;Taping;Manual techniques    PT Next Visit Plan  Complete instruction of Strength ABC Program (completed squats, cont with rows), and resume ionto (PTA forgot today).  Then continue cross friction massage and ionto to right wrist. PROM left shoulder and ROM exercises; myofascial release to left axilla and chest  Teach lymphedema risk reduction and Strength ABC prior to discharge     Consulted and Agree with Plan of Care  Patient  Patient will benefit from skilled therapeutic intervention in order to improve the following deficits and impairments:  Increased fascial restricitons, Pain, Decreased scar mobility, Postural dysfunction, Impaired UE functional use, Decreased strength, Decreased range of motion, Decreased knowledge of precautions  Visit Diagnosis: Pain in right wrist  Stiffness of right wrist, not elsewhere classified  Abnormal posture  Chronic left shoulder pain  Stiffness of left shoulder, not elsewhere classified     Problem List Patient Active Problem List   Diagnosis Date Noted  . Personal history of venous thrombosis and embolism 12/31/2016  . Peripheral neuropathy due to chemotherapy ([place]) 12/31/2016  . Acute pulmonary embolus ([place]) 08/15/2016  . Anemia due to antineoplastic chemotherapy 04/23/2016  . Genetic testing 04/03/2016  . Port catheter in place 03/26/2016  . Family history of breast cancer   . Family history of colon cancer   . 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 Malignant neoplasm of upper-outer quadrant of left female breast ([place]) 01/31/2016  . Bilateral leg numbness 08/14/2015  . Neck pain on left side 08/14/2015  . Abnormal MRI of head 08/14/2015    Otelia Limes, PTA 04/14/2017, 9:37 AM  [place], [place], [address] Phone: [phone]   Fax:  [phone]  Name: Grace Moyer MRN: [id number] Date of Birth: Nov 13, 1960

## 2017-04-16 ENCOUNTER — Encounter: Payer: Self-pay | Admitting: Physical Therapy

## 2017-04-16 ENCOUNTER — Ambulatory Visit: Payer: 59 | Admitting: Physical Therapy

## 2017-04-16 DIAGNOSIS — M25512 Pain in left shoulder: Secondary | ICD-10-CM

## 2017-04-16 DIAGNOSIS — R293 Abnormal posture: Secondary | ICD-10-CM

## 2017-04-16 DIAGNOSIS — M25612 Stiffness of left shoulder, not elsewhere classified: Secondary | ICD-10-CM

## 2017-04-16 DIAGNOSIS — G8929 Other chronic pain: Secondary | ICD-10-CM

## 2017-04-16 DIAGNOSIS — M25531 Pain in right wrist: Secondary | ICD-10-CM | POA: Diagnosis not present

## 2017-04-16 NOTE — Therapy (Signed)
[place], [place], [address] Phone: [phone]   Fax:  [phone]  Physical Therapy Treatment  Patient Details  Name: Grace Moyer MRN: [id number] Date of Birth: Sep 29, 1960 Referring Provider: Wilber Bihari, NP   Encounter Date: 04/16/2017  PT End of Session - 04/16/17 1604    Visit Number  6    Number of Visits  8    Date for PT Re-Evaluation  04/30/17    PT Start Time  1522    PT Stop Time  1558    PT Time Calculation (min)  36 min    Activity Tolerance  Patient tolerated treatment well    Behavior During Therapy  [facility] for tasks assessed/performed       Past Medical History:  Diagnosis Date  . Anxiety   . Breast cancer ([place])   . Family history of breast cancer   . Family history of colon cancer   . Malignant neoplasm of upper-outer quadrant of left female breast ([place]) 01/31/2016  . Neuromyelitis optica ([place])   . Personal history of chemotherapy   . Personal history of radiation therapy     Past Surgical History:  Procedure Laterality Date  . ABDOMINAL HYSTERECTOMY     partial  . BREAST LUMPECTOMY Left    2018  . BREAST LUMPECTOMY WITH RADIOACTIVE SEED AND SENTINEL LYMPH NODE BIOPSY Left 07/31/2016   Procedure: LEFT BREAST RADIOACTIVE SEED GUIDED LUMPECTOMY WITH LEFT RADIOACTIVE SEED TARGETED AXILLARY SENTINEL LYMPH NODE EXCISION AND SENTINEL LYMPH NODE BIOPSY;  Surgeon: Rolm Bookbinder, MD;  Location: [place];  Service: General;  Laterality: Left;  . BREAST REDUCTION SURGERY Bilateral 08/05/2016   Procedure: LEFT ONCOPLASTIC REDUCTION; RIGHT BREAST REDUCTION;  Surgeon: Irene Limbo, MD;  Location: [place];  Service: Plastics;  Laterality: Bilateral;  . EYE SURGERY    . PORT-A-CATH REMOVAL Right 07/31/2016   Procedure: REMOVAL PORT-A-CATH;  Surgeon: Rolm Bookbinder, MD;  Location: [place];  Service: General;  Laterality:  Right;  . PORTACATH PLACEMENT Right 02/11/2016   Procedure: INSERTION PORT-A-CATH WITH [place];  Surgeon: Rolm Bookbinder, MD;  Location: [place];  Service: General;  Laterality: Right;  . REDUCTION MAMMAPLASTY      There were no vitals filed for this visit.  Subjective Assessment - 04/16/17 1524    Subjective  My shoulder is feeling really good. I was going to see if I could be discharged because I am doing so well. I don't think the ionto is helping.    Pertinent History  She had a left lumpectomy and 5 axillary nodes removed on 07/31/16 followed by bilateral breast reduction on 08/05/16. She had neoadjuvant chemotherapy and then radiation after surgery. It was triple negative breast cancer.    Patient Stated Goals  Improve shoulder ROM and decrease right wrist pain    Currently in Pain?  Yes    Pain Score  4     Pain Location  Wrist    Pain Orientation  Right                      [facility] Adult PT Treatment/Exercise - 04/16/17 0001      Shoulder Exercises: Standing   Other Standing Exercises  Instructed pt in remaining exercises from Strength ABC program starting after squats and going through rest of packet- pt did all exercises 10x with 2 lb weights, she needed moderate verbal and tactile cues to perform  exercises correctly especially dead lifts             PT Education - 04/16/17 1607    Education provided  Yes    Education Details  try not to use right wrist at work as much, focus on increasing coordination in L hand to help offload right wrist, wear brace at work, importance of continuing to do exercises    Person(s) Educated  Patient    Methods  Explanation    Comprehension  Verbalized understanding          PT Long Term Goals - 04/16/17 [place] #1   Title  Patient will demonstrate she is independent with her home exericse program for her righrt wrist and left shoudlder ROM.    Baseline  04/16/17- pt feels independent  with this    Time  4    Period  Weeks    Status  Achieved      PT LONG TERM GOAL #2   Title  Increase right wrist active ulnar deviation to >/= 35 degrees for increased ease of movement.    Baseline  04/16/17- pt reports her wrist has not changed at all- pt going to follow up with doctor    Time  4    Period  Weeks    Status  Deferred      PT LONG TERM GOAL #3   Title  Increase left shoulder active flexion and abduction to >/= 140 degrees to return to baseline and reach overhead without difficulty.    Baseline  04/16/17- flexion- 153, abduction- 159 degrees    Time  4    Period  Weeks    Status  Achieved      PT LONG TERM GOAL #4   Title  Patient will report >/= 50% reduction in right wrist pain to tolerate her job with greater ease.    Baseline  04/16/17- pt reports she has not had any change in her wrist pain and plans to follow up with her doctor    Time  4    Period  Weeks    Status  Deferred      PT LONG TERM GOAL #5   Title  Patient will verbalize understanding of risk reduction practices related to left arm lymphedema.    Baseline  04/16/17- pt is able to verbalize these    Status  Achieved            Plan - 04/16/17 1604    Clinical Impression Statement  Pt states her shoulder is feeling much better and requests discharge from therapy. She states her right wrist pain and ROM has not really changed since beginning therapy. She is going to follow up with her doctor about this. She is now independent with her home exercise program and has met all other goals that did not pertain to her wrist. She will be discharged from skilled PT services at this time.     Rehab Potential  Excellent    PT Frequency  2x / week    PT Duration  4 weeks    PT Treatment/Interventions  ADLs/Self Care Home Management;Moist Heat;Iontophoresis 67m/ml Dexamethasone;Cryotherapy;Electrical Stimulation;Therapeutic exercise;Therapeutic activities;Patient/family education;Passive range of  motion;Taping;Manual techniques    PT Next Visit Plan  dc this visit    PT Home Exercise Plan  Wrist radial deviation ROM and shoulder post op ROM HEP, supine dowel exercises, supine scapular series, strength ABC  Consulted and Agree with Plan of Care  Patient       Patient will benefit from skilled therapeutic intervention in order to improve the following deficits and impairments:  Increased fascial restricitons, Pain, Decreased scar mobility, Postural dysfunction, Impaired UE functional use, Decreased strength, Decreased range of motion, Decreased knowledge of precautions  Visit Diagnosis: Abnormal posture  Chronic left shoulder pain  Stiffness of left shoulder, not elsewhere classified     Problem List Patient Active Problem List   Diagnosis Date Noted  . Personal history of venous thrombosis and embolism 12/31/2016  . Peripheral neuropathy due to chemotherapy ([place]) 12/31/2016  . Acute pulmonary embolus ([place]) 08/15/2016  . Anemia due to antineoplastic chemotherapy 04/23/2016  . Genetic testing 04/03/2016  . Port catheter in place 03/26/2016  . Family history of breast cancer   . Family history of colon cancer   . 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 Malignant neoplasm of upper-outer quadrant of left female breast ([place]) 01/31/2016  . Bilateral leg numbness 08/14/2015  . Neck pain on left side 08/14/2015  . Abnormal MRI of head 08/14/2015    Allyson Sabal [facility] 04/16/2017, 4:09 PM  [place], [place], [address] Phone: [phone]   Fax:  [phone]  Name: HENNY STRAUCH MRN: [id number] Date of Birth: 11-20-1960  PHYSICAL THERAPY DISCHARGE SUMMARY  Visits from Start of Care: 6  Current functional level related to goals / functional outcomes: Pt met all goals related to lymphedema risk reduction and left shoulder ROM   Remaining deficits: Continues to have  right wrist pain and decreased ROM but is going to follow up with her Dr.   Marilynn Latino / Equipment: See above  Plan: Patient agrees to discharge.  Patient goals were partially met. Patient is being discharged due to meeting the stated rehab goals.  ?????    Allyson Sabal [place], [place] 04/16/17 4:10 PM

## 2017-04-23 ENCOUNTER — Encounter: Payer: 59 | Admitting: Physical Therapy

## 2017-04-27 NOTE — Progress Notes (Signed)
[place]  Telephone:([phone] Fax:([phone]) [phone]  Clinic Follow up Note   Patient Care Team: Maisie Fus, MD as PCP - General (Obstetrics and Gynecology) Collene Gobble, MD as Attending Physician (Psychiatry) Rolm Bookbinder, MD as Consulting Physician (General Surgery) Truitt Merle, MD as Consulting Physician (Hematology) Kyung Rudd, MD as Consulting Physician (Radiation Oncology) Gardenia Phlegm, NP as Nurse Practitioner (Hematology and Oncology) 04/29/2017   CHIEF COMPLAINTS:  Follow up left breast triple negative cancer  Oncology History   Cancer Staging Malignant neoplasm of upper-outer quadrant of left female breast [facility]) Staging form: Breast, AJCC 7th Edition - Clinical stage from 01/29/2016: Stage IIB (T2, N1, M0) - Signed by Truitt Merle, MD on 02/06/2016 - Pathologic stage from 07/31/2016: T0, N0, cM0 - Signed by Truitt Merle, MD on 12/31/2016       Malignant neoplasm of upper-outer quadrant of left female breast ([place])   01/28/2016 Mammogram    Diagnostic MM and US showed a 2.9cm (3.2X2.2X2.5cm on [place]) mass in UOQ, multiple enlarged left axillary nodes, largest 2.5cm.       01/29/2016 Initial Diagnosis    Malignant neoplasm of upper-outer quadrant of left female breast ([place])      01/29/2016 Initial Biopsy    Left breast mass and axillary node biopsy showed IDC, G3,       01/29/2016 Receptors her2    ER-, PR-, HER2-, Ki67 85%      02/11/2016 Imaging    Bilateral breast MRI with and without contrast showed a 3.3 x 2.6 x 2.6 cm mass in the upper outer left breast, and left axillary lymphadenopathy, at least 3 abnormal lymph nodes, no other additional sites of concern.      02/12/2016 Imaging    CT chest, abdomen and pelvis with contrast showed a 2.4 x 2.7 cm lesion in the upper outer left breast, left axillary node metastasis measuring up to 1.3 cm, no evidence of distant metastasis.      02/12/2016 Imaging    Bone scan was  negative for skeletal metastasis.      02/13/2016 - 07/03/2016 Neo-Adjuvant Chemotherapy    Dose dense Adriamycin 60 mg/m, Cytoxan 600 mg/m, every 2 weeks, for 4 cycles, followed by weekly carboplatin and Taxol for 12 weeks       03/27/2016 Genetic Testing    Patient has genetic testing done for personal history of breast cancer, family history of cancer. ATM c.2606C>T VUS identified on the Breast/GYN panel.  Negative genetic testing for the MSH2 inversion analysis (Boland inversion). The Breast/GYN gene panel offered by [facility] includes sequencing and rearrangement analysis for the following 23 genes:  ATM, BARD1, BRCA1, BRCA2, BRIP1, CDH1, CHEK2, EPCAM, FANCC, MLH1, MSH2, MSH6, MUTYH, NBN, NF1, PALB2, PMS2, POLD1, PTEN, RAD51C, RAD51D, RECQL, and TP53.         07/11/2016 Imaging    Bilateral Breast MRI FINDINGS: Breast composition: c. Heterogeneous fibroglandular tissue.  Background parenchymal enhancement: Minimal.  Right breast: No mass or abnormal enhancement.  Left breast: No mass or abnormal enhancement. Biopsy clip is identified at the posterior left breast upper outer quadrant. The previously noted enhancing mass is not seen currently.  Lymph nodes: No abnormal appearing lymph nodes. Previously noted abnormal lymph nodes in the left axilla are currently normal size.  Ancillary findings:  None.  IMPRESSION: Known cancer.      07/31/2016 Pathology Results    Diagnosis 1. Breast, lumpectomy, Left - LOBULAR NEOPLASIA (ATYPICAL LOBULAR HYPERPLASIA). - FIBROCYSTIC CHANGES WITH ADENOSIS  AND CALCIFICATIONS. - [place]. - SEE ONCOLOGY TABLE BELOW. 2. Breast, excision, Left additional Medial Margin - LOBULAR NEOPLASIA (ATYPICAL LOBULAR HYPERPLASIA). - FIBROCYSTIC CHANGES WITH ADENOSIS AND CALCIFICATIONS. - RADIAL SCAR. - HEALING BIOPSY SITE. - SEE COMMENT. 3. Lymph node, sentinel, biopsy, Left - THERE IS NO EVIDENCE OF CARCINOMA IN 1 OF 1 LYMPH  NODE (0/1) - CHANGES CONSISTENT WITH PRIOR PROCEDURE. 4. Lymph node, sentinel, biopsy, Left - THERE IS NO EVIDENCE OF CARCINOMA IN 1 OF 1 LYMPH NODE (0/1) 5. Lymph node, sentinel, biopsy, Left - THERE IS NO EVIDENCE OF CARCINOMA IN 1 OF 1 LYMPH NODE (0/1) 6. Lymph node, sentinel, biopsy, Left - THERE IS NO EVIDENCE OF CARCINOMA IN 1 OF 1 LYMPH NODE (0/1) 7. Lymph node, sentinel, biopsy, Left - THERE IS NO EVIDENCE OF CARCINOMA IN 1 OF 1 LYMPH NODE (0/1)      07/31/2016 Surgery    LEFT BREAST RADIOACTIVE SEED GUIDED LUMPECTOMY WITH LEFT RADIOACTIVE SEED TARGETED AXILLARY SENTINEL LYMPH NODE EXCISION AND SENTINEL LYMPH NODE BIOPSY and port removal done by Dr. Donne Hazel.        08/05/2016 Pathology Results    Surgical pathology  Diagnosis 1. Breast, Mammoplasty, Left - SCLEROSING ADENOSIS WITH CALCIFICATIONS - FIBROCYSTIC CHANGES - DUCT ECTASIA - NO MALIGNANCY IDENTIFIED 2. Breast, Mammoplasty, Right - SCLEROSING ADENOSIS WITH CALCIFICATIONS - FIBROCYSTIC CHANGES - DUCT ECTASIA - NO MALIGNANCY IDENTIFIED      08/05/2016 Surgery    LEFT ONCOPLASTIC REDUCTION; RIGHT BREAST REDUCTION by Dr. Iran Planas       09/11/2016 - 10/27/2016 Radiation Therapy    Radiation with Dr. Lisbeth Renshaw  Radiation treatment dates:   09/11/2016 to 10/27/2016  Site/dose:    1. The Left breast was treated to 50.4 Gy in 28 fractions at 1.8 Gy per fraction. 2. The Left Sclav was treated to 50.4 Gy in 28 fractions at 1.8 Gy per fraction. 3. The Left breast was boosted to 10 Gy in 5 fractions at 2 Gy per fraction.   Beams/energy:    1. 3D // 10X, 15X 2. photon // 15X, 6X  Narrative: The patient tolerated radiation treatment relatively well.  She developed mild erythema within the treatment field without any moist desquamation. Using Radiaplex as directed and using neosporin under inframammary fold where skin has peeled.       01/19/2017 Mammogram    IMPRESSION: No mammographic evidence of malignancy, status  post left lumpectomy and bilateral reduction mammoplasty.      HISTORY OF PRESENTING ILLNESS (02/06/16) :  Grace Moyer 57 y.o. female is here because of her recently diagnosed left breast cancer. She was accompanied By her husband to our multidisciplinary breast clinic today.  This was discovered by screening mammogram, she had no palpable breast mass or axilla note. She denies any other new symptoms. She underwent a diagnostic mammogram and ultrasound on 01/28/2016, which showed a 2.9 cm mass in the upper outer quadrant, and multiple enlarged left axillary nodes, largest 2.5 cm. She underwent core needle biopsy of the left breast mass and left axillary node, both showed invasive ductal carcinoma, grade 3, triple negative, Ki-67 85%.  She developed bilateral numbness and tingling of her legs and hands at the end of May 2017. She underwent a multiple scans, and lab test, and was finally seen by neurologist Dr. Moshe Cipro, and was diagnosed with neuromyelitis optica spectrum disorder (NOSD). She initially received high-dose steroids, and then started Rituxan infusion, status post 2 infusions, now is on every four-month  schedule, next due in Jan 2018. She states her neuropathy has been stable, she denies any significant vision problem, or other neurological symptoms. Her hand function and gait are normal. She is an Clinical biochemist.   GYN HISTORY  Menarchal: 13 LMP: 61 (hysterectomy) Contraceptive: 3 years  HRT: n/a  G1P1: 32 yo daughter   CURRENT THERAPY:  1. Rituxan injection every 4 months for NOSD 2. Xarelto started in 08/1026. Continue until 01/2017, then change to baby aspirin once daily.   INTERIM HISTORY  Grace Moyer returns for follow-up. She presents to the clinic today by herself. She reports she is having tightness in her right calf and she notes this feels similar to how her last DVT started out. She is currently taking 81 mg ASA and she finished her Xarelto in Dec, 2018. She also  reports cold symptoms. She endorses a low grade fever that has resolved with Tylenol. Her symptoms have improved. She notes to have stiffness in her hands that is worse in the morning. She is seeing her neurologist in March, 2019.   On review of systems, pt denies leg swelling, pain or any other complaints at this time. Pertinent positives are listed and detailed within the above HPI.   MEDICAL HISTORY:  Past Medical History:  Diagnosis Date  . Anxiety   . Breast cancer ([place])   . Family history of breast cancer   . Family history of colon cancer   . Malignant neoplasm of upper-outer quadrant of left female breast ([place]) 01/31/2016  . Neuromyelitis optica ([place])   . Personal history of chemotherapy   . Personal history of radiation therapy     SURGICAL HISTORY: Past Surgical History:  Procedure Laterality Date  . ABDOMINAL HYSTERECTOMY     partial  . BREAST LUMPECTOMY Left    2018  . BREAST LUMPECTOMY WITH RADIOACTIVE SEED AND SENTINEL LYMPH NODE BIOPSY Left 07/31/2016   Procedure: LEFT BREAST RADIOACTIVE SEED GUIDED LUMPECTOMY WITH LEFT RADIOACTIVE SEED TARGETED AXILLARY SENTINEL LYMPH NODE EXCISION AND SENTINEL LYMPH NODE BIOPSY;  Surgeon: Rolm Bookbinder, MD;  Location: [place];  Service: General;  Laterality: Left;  . BREAST REDUCTION SURGERY Bilateral 08/05/2016   Procedure: LEFT ONCOPLASTIC REDUCTION; RIGHT BREAST REDUCTION;  Surgeon: Irene Limbo, MD;  Location: [place];  Service: Plastics;  Laterality: Bilateral;  . EYE SURGERY    . PORT-A-CATH REMOVAL Right 07/31/2016   Procedure: REMOVAL PORT-A-CATH;  Surgeon: Rolm Bookbinder, MD;  Location: [place];  Service: General;  Laterality: Right;  . PORTACATH PLACEMENT Right 02/11/2016   Procedure: INSERTION PORT-A-CATH WITH [place];  Surgeon: Rolm Bookbinder, MD;  Location: [place];  Service: General;  Laterality: Right;  . REDUCTION MAMMAPLASTY       SOCIAL HISTORY: Social History   Socioeconomic History  . Marital status: Married    Spouse name: Not on file  . Number of children: Not on file  . Years of education: Not on file  . Highest education level: Not on file  Social Needs  . Financial resource strain: Not on file  . Food insecurity - worry: Not on file  . Food insecurity - inability: Not on file  . Transportation needs - medical: Not on file  . Transportation needs - non-medical: Not on file  Occupational History  . Not on file  Tobacco Use  . Smoking status: Former Smoker    Types: Cigarettes    Last attempt to quit: 09/03/2001    Years  since quitting: 15.6  . Smokeless tobacco: Never Used  Substance and Sexual Activity  . Alcohol use: Yes    Comment: social  . Drug use: No  . Sexual activity: Not on file  Other Topics Concern  . Not on file  Social History Narrative  . Not on file    FAMILY HISTORY: Family History  Problem Relation Age of Onset  . Breast cancer Maternal Grandmother        dx in her 53s  . CAD Paternal Grandmother   . Colon cancer Paternal Uncle        dx in his 38s  . COPD Maternal Grandfather   . CAD Paternal Grandfather   . Breast cancer Cousin        dx in her early to mid 14s; maternal first cousin  . Lung cancer Paternal Uncle     ALLERGIES:  is allergic to hydrocodone.  MEDICATIONS:  Current Outpatient Medications  Medication Sig Dispense Refill  . aspirin 81 MG tablet Take 1 tablet (81 mg total) by mouth daily. 30 tablet   . Cholecalciferol (VITAMIN D-1000 MAX ST) 1000 units tablet Take 2 tablets by mouth daily.     No current facility-administered medications for this visit.    Facility-Administered Medications Ordered in Other Visits  Medication Dose Route Frequency Provider Last Rate Last Dose  . heparin lock flush 100 unit/mL  500 Units Intravenous Once PRN Truitt Merle, MD      . sodium chloride flush (NS) 0.9 % injection 10 mL  10 mL Intracatheter PRN Truitt Merle, MD   10 mL at 06/18/16 1540  . sodium chloride flush (NS) 0.9 % injection 10 mL  10 mL Intravenous PRN Truitt Merle, MD      . sodium chloride flush (NS) 0.9 % injection 10 mL  10 mL Intravenous PRN Truitt Merle, MD   10 mL at 06/25/16 1106    REVIEW OF SYSTEMS:   Constitutional: Denies fevers, chills or abnormal night sweats (+) hair growth Eyes: Denies blurriness of vision, double vision or watery eyes Ears, nose, mouth, throat, and face: Denies mucositis or sore throat Respiratory: Denies cough, dyspnea or wheezes Cardiovascular: Denies palpitation, chest discomfort or lower extremity swelling Gastrointestinal:  Denies nausea, heartburn Skin: negative Lymphatics: Denies new lymphadenopathy or easy bruising Neurological (+) tingling in fingers/toes, neuropathy stable Behavioral/Psych: negative MSK: (+) mild right calf tenderness  All other systems were reviewed with the patient and are negative.  PHYSICAL EXAMINATION: ECOG PERFORMANCE STATUS: 1 - Symptomatic but completely ambulatory  Vitals:   04/29/17 1511  BP: 135/81  Pulse: 72  Resp: 18  Temp: 98.7 F (37.1 C)  TempSrc: Oral  SpO2: 100%  Weight: 203 lb 3.2 oz (92.2 kg)  Height: _0  (1.6 m)     GENERAL:alert, no distress and comfortable.  SKIN: skin color, texture, turgor are normal, no rashes, Skin darkness and draining from previous cyst incision In right lower buttock EYES: normal, conjunctiva are pink and non-injected, sclera clear OROPHARYNX:no exudate, no erythema and lips, buccal mucosa, and tongue normal  NECK: supple, thyroid normal size, non-tender, without nodularity. LYMPH:  no palpable lymphadenopathy in the cervical, axillary or inguinal LUNGS: clear to auscultation and percussion with normal breathing effort HEART: regular rate & rhythm and no murmurs and no lower extremity edema ABDOMEN:abdomen soft, non-tender and normal bowel sounds MUSCULOSKELETALl:no cyanosis of digits and no clubbing  PSYCH:  alert & oriented x 3 with fluent speech NEURO: no focal motor/sensory  deficits Lower extremity: No lag edema bilaterally. No calf tenderness or redness.  Breasts: deferred today due to scheduled US Doppler to rule out DVT  LABORATORY DATA:  I have reviewed the data as listed CBC Latest Ref Rng & Units 04/29/2017 12/31/2016 11/07/2016  WBC 3.9 - 10.3 K/uL 2.9(L) 3.9 3.8(L)  Hemoglobin 11.6 - 15.9 g/dL 12.7 12.2 12.9  Hematocrit 34.8 - 46.6 % 37.8 36.0 38.5  Platelets 145 - 400 K/uL 136(L) 165 173   CMP Latest Ref Rng & Units 04/29/2017 12/31/2016 11/07/2016  Glucose 70 - 140 mg/dL 94 103 94  BUN 7 - 26 mg/dL 15 11.0 17.4  Creatinine 0.60 - 1.10 mg/dL 0.91 0.9 1.0  Sodium 136 - 145 mmol/L 144 140 141  Potassium 3.5 - 5.1 mmol/L 4.5 4.7 4.0  Chloride 98 - 109 mmol/L 109 - -  CO2 22 - 29 mmol/L _0 Calcium 8.4 - 10.4 mg/dL 9.5 9.5 9.8  Total Protein 6.4 - 8.3 g/dL 6.9 6.8 7.1  Total Bilirubin 0.2 - 1.2 mg/dL 0.4 0.58 0.64  Alkaline Phos 40 - 150 U/L 179(H) 124 112  AST 5 - 34 U/L 44(H) 32 26  ALT 0 - 55 U/L 67(H) 42 34    PATHOLOGY REPORT   Diagnosis 08/05/16 1. Breast, Mammoplasty, Left - SCLEROSING ADENOSIS WITH CALCIFICATIONS - FIBROCYSTIC CHANGES - DUCT ECTASIA - NO MALIGNANCY IDENTIFIED 2. Breast, Mammoplasty, Right - SCLEROSING ADENOSIS WITH CALCIFICATIONS - FIBROCYSTIC CHANGES - DUCT ECTASIA - NO MALIGNANCY IDENTIFIED  Diagnosis 07/31/16 1. Breast, lumpectomy, Left - LOBULAR NEOPLASIA (ATYPICAL LOBULAR HYPERPLASIA). - FIBROCYSTIC CHANGES WITH ADENOSIS AND CALCIFICATIONS. - [place]. - SEE ONCOLOGY TABLE BELOW. 2. Breast, excision, Left additional Medial Margin - LOBULAR NEOPLASIA (ATYPICAL LOBULAR HYPERPLASIA). - FIBROCYSTIC CHANGES WITH ADENOSIS AND CALCIFICATIONS. - RADIAL SCAR. - HEALING BIOPSY SITE. - SEE COMMENT. 3. Lymph node, sentinel, biopsy, Left - THERE IS NO EVIDENCE OF CARCINOMA IN 1 OF 1 LYMPH NODE (0/1) - CHANGES CONSISTENT  WITH PRIOR PROCEDURE. 4. Lymph node, sentinel, biopsy, Left - THERE IS NO EVIDENCE OF CARCINOMA IN 1 OF 1 LYMPH NODE (0/1) 5. Lymph node, sentinel, biopsy, Left - THERE IS NO EVIDENCE OF CARCINOMA IN 1 OF 1 LYMPH NODE (0/1) 6. Lymph node, sentinel, biopsy, Left - THERE IS NO EVIDENCE OF CARCINOMA IN 1 OF 1 LYMPH NODE (0/1) 7. Lymph node, sentinel, biopsy, Left - THERE IS NO EVIDENCE OF CARCINOMA IN 1 OF 1 LYMPH NODE (0/1) Microscopic Comment 1. BREAST, STATUS POST NEOADJUVANT TREATMENT Procedure: Seed localized lumpectomy, additional medial margin resection and multiple lymph node resections. Laterality: Left Tumor Size: N/A Histologic Type: N/A Grade: N/A Microscopic Comment(continued) Ductal Carcinoma in Situ (DCIS): Not identified. Regional Lymph Nodes: Number of Lymph Nodes Examined: 5 Number of Sentinel Lymph Nodes Examined: 5 Lymph Nodes with Micrometastases: 0 Lymph Nodes with Micrometastases: 0 Lymph Nodes with Isolated Tumor Cells: 0 Margins: N/A Extent of Tumor: N/A Breast Prognostic Profile (pre-neoadjuvant case #: [id number]) Estrogen Receptor: 0%. Progesterone Receptor: less than 1%. Her2: No amplification was detected. Ki-67: 85% Residual Cancer Burden (RCB): This case is considered a complete pathologic response. Pathologic Stage Classification (p TNM, AJCC 8th Edition): Primary Tumor (ypT): ypT0 Regional Lymph Nodes (ypN): ypN0 (JBK:gt, 08/04/16) Comments: The breast tissue and lymph nodal tissue both show significant treatment related c   Diagnosis 01/29/2016 1. Breast, left, needle core biopsy, 1:30 o'clock - INVASIVE DUCTAL CARCINOMA, GRADE 3. - LYMPHOVASCULAR INVOLVEMENT BY TUMOR. 2. Lymph node, needle/core biopsy,  left axillary - METASTATIC CARCINOMA.  RADIOGRAPHIC STUDIES: I have personally reviewed the radiological images as listed and agreed with the findings in the report. No results found.  Diagnostic Mammogram 01/19/17 IMPRESSION: No  mammographic evidence of malignancy, status post left lumpectomy and bilateral reduction mammoplasty.  Breast MRI 07/11/16 IMPRESSION: Known cancer.  Bone scan 02/12/2016 IMPRESSION: No evidence of skeletal metastatic disease.  CT chest abdomen pelvis w contrast 02/12/2016 IMPRESSION: 2.4 x 2.7 cm lesion in the upper outer left breast, corresponding to the patient's known breast neoplasm. Left axillary nodal metastasis measuring up to 13 mm short axis. No evidence of distant metastasis.  ASSESSMENT & PLAN: 57 y.o. Caucasian woman with past medical history of depression, presented with a screening mammogram discovered left breast cancer   1. Malignant neoplasm of upper-outer quadrant of left breast, invasive ductal carcinoma, G3, cT2N1M0, stage IIB, ER-/PR-/HER2-, ypT0N0 -She received neoadjuvant chemotherapy with dose dense Adriamycin, Cytoxan every 2 weeks, with Neulasta support, for 4 cycles, followed by weekly carboplatin and Taxol for 12 weeks she tolerated well overall.,   -She had surgery 07/31/16 and breast reduction 08/05/16. Pathology was discussed with pt previously and showed no residual cancer after neoadjuvant chemotherapy, which is an excellent prognostic factor for good outcome.  -Giving the complete pathologic response, I do not recommend adjuvant Xeloda.  -She underwent radiation with Dr. Lisbeth Renshaw on 09/11/16-10/27/16, tolerated well.  -I previously discussed the surveillance plan, which is a physical exam and lab test (including CBC & CMP) every 3-4 months for the first 2 years, then every 6-12 months for a total of 5 years, diagnotic mammogram once a year.  -She still has residual neuropathy from treatment. I suggest a B complex. She does not want to take Gabapentin.  -I previously discussed symptoms which may be associated cancer recurrence, she knows to watch and contact us if needed. She does not need routine whole body scans unless there is clinical concerns for recurrence.    -She is clinically doing well. Her mammogram from 01/19/17 was negative for malignancy. Labs reviewed today, her liver enzymes are slightly elevated. Will repeat lab in 2 months. Her breast exam was deferred today due to scheduled doppler to rule out DVT. There is no concern for recurrence.  -F/u in 4 months    2. Left LE DVT and bilateral acute submassive PE, provoked by surgeries admitted 08/15/16 -She developed extensive left lower extremity DVT with bilateral acute submassive PE, requiring hospitalization -This is probably provoked by her breast surgery and inmobility -I previously recommend her to remain on Xarelto for 6 months. Then remain on baby aspirin. She was tolerating Xarelto well. Precautions for fall and injury were discussed with patient. -She completed Xarelto in 01/2017 and then proceeded with baby aspirin once daily.  -She is having right calf tightness and tenderness today, I sent her for US Doppler of the bilateral extremity to rule out DVT at [facility]   3. Genetics -Given her young age and triple negative disease, we recommend her to have genetic testing to rule out inheritable breast cancer syndrome, and the test result may impact her surgery. -ATM c.2606C>T VUS identified on the Breast/Gyn panel.  Negative genetic testing for the MSH2 inversion analysis (Boland inversion). The Breast/GYN gene panel offered by [facility] includes sequencing and rearrangement analysis for the following 23 genes:  ATM, BARD1, BRCA1, BRCA2, BRIP1, CDH1, CHEK2, EPCAM, FANCC, MLH1, MSH2, MSH6, MUTYH, NBN, NF1, PALB2, PMS2, POLD1, PTEN, RAD51C, RAD51D, RECQL, and TP53.   The report  date is March 27, 2016  4. neuromyelitis optica spectrum disorder (NOSD) -she will continue follow up with her neurologist Dr. Moshe Cipro at [facility]  -she is on rituximab every 4 weeks. It will be OK to continue her Rituximab when she is on chemotherapy, but the combination therapy will further compromise her immune system. -I have  previously spoken with Dr. Moshe Cipro about her breast cancer treatment, he is agreeable. -She previously received Rituxan in our cancer center on 03/21/2016, tolerated generally well. She prefers to get next dose Rituxan here with [place], which she received on 11/07/16 -She will continue to receive Rituxan injection through our clinic every 4 months -next due in May 2019  4. Peripheral neuropathy, G1 -Her tingling and numbness got slightly worse towards the end of chemotherapy, likely related to her chemotherapy -No need for Neurontin at this point, continue monitoring. -She still has residual neuropathy from treatment. She does not want to take Gabapentin. Continue B complex.    Plan US Doppler at [facility] to rule out DVT Lab and f/u in 4 months   Lab two months  Rituxan injection in May 2019 (will schedule after visit with her neurologist at [facility] in March)  No orders of the defined types were placed in this encounter.   All questions were answered. The patient knows to call the clinic with any problems, questions or concerns.  I spent 20 minutes counseling the patient face to face. The total time spent in the appointment was 25 minutes and more than 50% was on counseling.  This document serves as a record of services personally performed by Truitt Merle, MD. It was created on her behalf by Theresia Bough, a trained medical scribe. The creation of this record is based on the scribe's personal observations and the provider's statements to them.   I have reviewed the above documentation for accuracy and completeness, and I agree with the above.     Truitt Merle, MD 04/29/2017   Addendum  Her Doppler of lower extremity was negative for DVT today.  Patient is aware about the results.  Truitt Merle  04/29/2017

## 2017-04-29 ENCOUNTER — Inpatient Hospital Stay: Payer: 59 | Attending: Adult Health | Admitting: Hematology

## 2017-04-29 ENCOUNTER — Inpatient Hospital Stay: Payer: 59

## 2017-04-29 ENCOUNTER — Ambulatory Visit (HOSPITAL_COMMUNITY)
Admission: RE | Admit: 2017-04-29 | Discharge: 2017-04-29 | Disposition: A | Discharge status: Home / Self Care | Payer: 59 | Source: Ambulatory Visit | Attending: Hematology | Admitting: Hematology

## 2017-04-29 ENCOUNTER — Encounter: Payer: Self-pay | Admitting: Hematology

## 2017-04-29 VITALS — BP 135/81 | HR 72 | Temp 98.7°F | Resp 18 | Ht 63.0 in | Wt 203.2 lb

## 2017-04-29 DIAGNOSIS — Z86718 Personal history of other venous thrombosis and embolism: Secondary | ICD-10-CM | POA: Insufficient documentation

## 2017-04-29 DIAGNOSIS — C773 Secondary and unspecified malignant neoplasm of axilla and upper limb lymph nodes: Secondary | ICD-10-CM | POA: Insufficient documentation

## 2017-04-29 DIAGNOSIS — G620 Drug-induced polyneuropathy: Secondary | ICD-10-CM | POA: Diagnosis not present

## 2017-04-29 DIAGNOSIS — Z87891 Personal history of nicotine dependence: Secondary | ICD-10-CM | POA: Insufficient documentation

## 2017-04-29 DIAGNOSIS — T451X5A Adverse effect of antineoplastic and immunosuppressive drugs, initial encounter: Secondary | ICD-10-CM

## 2017-04-29 DIAGNOSIS — G360 Neuromyelitis optica [Devic]: Secondary | ICD-10-CM

## 2017-04-29 DIAGNOSIS — Z171 Estrogen receptor negative status [ER-]: Secondary | ICD-10-CM | POA: Diagnosis not present

## 2017-04-29 DIAGNOSIS — M79604 Pain in right leg: Secondary | ICD-10-CM

## 2017-04-29 DIAGNOSIS — C50412 Malignant neoplasm of upper-outer quadrant of left female breast: Secondary | ICD-10-CM | POA: Insufficient documentation

## 2017-04-29 DIAGNOSIS — Z7982 Long term (current) use of aspirin: Secondary | ICD-10-CM | POA: Insufficient documentation

## 2017-04-29 DIAGNOSIS — Z803 Family history of malignant neoplasm of breast: Secondary | ICD-10-CM | POA: Diagnosis not present

## 2017-04-29 LAB — COMPREHENSIVE METABOLIC PANEL
ALT: 67 U/L — ABNORMAL HIGH (ref 0–55)
AST: 44 U/L — ABNORMAL HIGH (ref 5–34)
Albumin: 3.8 g/dL (ref 3.5–5.0)
Alkaline Phosphatase: 179 U/L — ABNORMAL HIGH (ref 40–150)
Anion gap: 11 (ref 3–11)
BUN: 15 mg/dL (ref 7–26)
CO2: 24 mmol/L (ref 22–29)
Calcium: 9.5 mg/dL (ref 8.4–10.4)
Chloride: 109 mmol/L (ref 98–109)
Creatinine, Ser: 0.91 mg/dL (ref 0.60–1.10)
GFR calc Af Amer: >60 mL/min (ref >60)
GFR calc non Af Amer: >60 mL/min (ref >60)
Glucose, Bld: 94 mg/dL (ref 70–140)
Potassium: 4.5 mmol/L (ref 3.5–5.1)
Sodium: 144 mmol/L (ref 136–145)
Total Bilirubin: 0.4 mg/dL (ref 0.2–1.2)
Total Protein: 6.9 g/dL (ref 6.4–8.3)

## 2017-04-29 LAB — CBC WITH DIFFERENTIAL/PLATELET
Basophils Absolute: 0 10*3/uL (ref 0.0–0.1)
Basophils Relative: 0 %
Eosinophils Absolute: 0.1 10*3/uL (ref 0.0–0.5)
Eosinophils Relative: 2 %
HCT: 37.8 % (ref 34.8–46.6)
Hemoglobin: 12.7 g/dL (ref 11.6–15.9)
Lymphocytes Relative: 27 %
Lymphs Abs: 0.8 10*3/uL — ABNORMAL LOW (ref 0.9–3.3)
MCH: 29.9 pg (ref 25.1–34.0)
MCHC: 33.6 g/dL (ref 31.5–36.0)
MCV: 88.9 fL (ref 79.5–101.0)
Monocytes Absolute: 0.3 10*3/uL (ref 0.1–0.9)
Monocytes Relative: 10 %
Neutro Abs: 1.8 10*3/uL (ref 1.5–6.5)
Neutrophils Relative %: 61 %
Platelets: 136 10*3/uL — ABNORMAL LOW (ref 145–400)
RBC: 4.25 MIL/uL (ref 3.70–5.45)
RDW: 13.4 % (ref 11.2–14.5)
WBC: 2.9 10*3/uL — ABNORMAL LOW (ref 3.9–10.3)

## 2017-04-29 NOTE — Progress Notes (Signed)
RLE Venous duplex prelim: negative for DVT. Landry Mellow, RDMS, RVT  Called results to Dr. Burr Medico

## 2017-04-30 ENCOUNTER — Encounter: Payer: 59 | Admitting: Physical Therapy

## 2017-04-30 ENCOUNTER — Telehealth: Payer: Self-pay | Admitting: Hematology

## 2017-04-30 NOTE — Telephone Encounter (Signed)
Spoke with patient regarding appointments per 2/27 los.

## 2017-05-29 DIAGNOSIS — M654 Radial styloid tenosynovitis [de Quervain]: Secondary | ICD-10-CM | POA: Diagnosis not present

## 2017-06-08 ENCOUNTER — Encounter: Payer: Self-pay | Admitting: Hematology

## 2017-06-17 ENCOUNTER — Telehealth: Payer: Self-pay

## 2017-06-17 NOTE — Telephone Encounter (Signed)
Left vm msg with Dr. Moshe Cipro secretary in regards to [place]. Needed by insurance company for pt to recv. Rituxan @ [facility]. Will follow-up

## 2017-06-25 DIAGNOSIS — G369 Acute disseminated demyelination, unspecified: Secondary | ICD-10-CM | POA: Diagnosis not present

## 2017-06-26 ENCOUNTER — Inpatient Hospital Stay: Payer: 59 | Attending: Adult Health

## 2017-06-26 DIAGNOSIS — Z171 Estrogen receptor negative status [ER-]: Secondary | ICD-10-CM

## 2017-06-26 DIAGNOSIS — C50412 Malignant neoplasm of upper-outer quadrant of left female breast: Secondary | ICD-10-CM | POA: Diagnosis present

## 2017-06-26 LAB — CBC WITH DIFFERENTIAL/PLATELET
Basophils Absolute: 0 10*3/uL (ref 0.0–0.1)
Basophils Relative: 0 %
Eosinophils Absolute: 0.1 10*3/uL (ref 0.0–0.5)
Eosinophils Relative: 2 %
HCT: 39.1 % (ref 34.8–46.6)
Hemoglobin: 13.3 g/dL (ref 11.6–15.9)
Lymphocytes Relative: 19 %
Lymphs Abs: 0.9 10*3/uL (ref 0.9–3.3)
MCH: 30.4 pg (ref 25.1–34.0)
MCHC: 34 g/dL (ref 31.5–36.0)
MCV: 89.3 fL (ref 79.5–101.0)
Monocytes Absolute: 0.4 10*3/uL (ref 0.1–0.9)
Monocytes Relative: 8 %
Neutro Abs: 3.4 10*3/uL (ref 1.5–6.5)
Neutrophils Relative %: 71 %
Platelets: 155 10*3/uL (ref 145–400)
RBC: 4.38 MIL/uL (ref 3.70–5.45)
RDW: 13.4 % (ref 11.2–14.5)
WBC: 4.8 10*3/uL (ref 3.9–10.3)

## 2017-06-26 LAB — COMPREHENSIVE METABOLIC PANEL
ALT: 25 U/L (ref 0–55)
AST: 21 U/L (ref 5–34)
Albumin: 4.3 g/dL (ref 3.5–5.0)
Alkaline Phosphatase: 103 U/L (ref 40–150)
Anion gap: 9 (ref 3–11)
BUN: 11 mg/dL (ref 7–26)
CO2: 25 mmol/L (ref 22–29)
Calcium: 9.8 mg/dL (ref 8.4–10.4)
Chloride: 107 mmol/L (ref 98–109)
Creatinine, Ser: 0.91 mg/dL (ref 0.60–1.10)
GFR calc Af Amer: >60 mL/min (ref >60)
GFR calc non Af Amer: >60 mL/min (ref >60)
Glucose, Bld: 75 mg/dL (ref 70–140)
Potassium: 3.5 mmol/L (ref 3.5–5.1)
Sodium: 141 mmol/L (ref 136–145)
Total Bilirubin: 0.5 mg/dL (ref 0.2–1.2)
Total Protein: 7.1 g/dL (ref 6.4–8.3)

## 2017-06-30 ENCOUNTER — Telehealth: Payer: Self-pay

## 2017-06-30 NOTE — Telephone Encounter (Signed)
Spoke with Phylliss Blakes at Dr. Rosemarie Ax office. Per Phylliss Blakes Dr. Moshe Cipro will write the LMN and it will be sent to the pt. Insurance company. Insurance information given as well as the fax number to send the LMN for Rituxan.

## 2017-06-30 NOTE — Telephone Encounter (Signed)
Left VM msg with Dr. Rosemarie Ax secretary in regards to needing a letter of medical necessity for pt insurance in order for pt to receive Rituxan injections. Pt insurance will not cover injections given by [facility].

## 2017-07-17 ENCOUNTER — Encounter: Payer: Self-pay | Admitting: Hematology

## 2017-07-20 ENCOUNTER — Telehealth: Payer: Self-pay

## 2017-07-20 ENCOUNTER — Other Ambulatory Visit: Payer: Self-pay | Admitting: Hematology

## 2017-07-20 ENCOUNTER — Inpatient Hospital Stay: Payer: 59 | Attending: Adult Health

## 2017-07-20 VITALS — BP 111/74 | HR 59 | Temp 98.7°F | Resp 18

## 2017-07-20 DIAGNOSIS — Z5112 Encounter for antineoplastic immunotherapy: Secondary | ICD-10-CM | POA: Insufficient documentation

## 2017-07-20 DIAGNOSIS — G360 Neuromyelitis optica [Devic]: Secondary | ICD-10-CM

## 2017-07-20 DIAGNOSIS — Z79899 Other long term (current) drug therapy: Secondary | ICD-10-CM | POA: Diagnosis not present

## 2017-07-20 DIAGNOSIS — C50412 Malignant neoplasm of upper-outer quadrant of left female breast: Secondary | ICD-10-CM | POA: Insufficient documentation

## 2017-07-20 DIAGNOSIS — C773 Secondary and unspecified malignant neoplasm of axilla and upper limb lymph nodes: Secondary | ICD-10-CM | POA: Diagnosis not present

## 2017-07-20 MED ORDER — DIPHENHYDRAMINE HCL 50 MG/ML IJ SOLN
INTRAMUSCULAR | Status: AC
  Filled 2017-07-20: qty 1

## 2017-07-20 MED ORDER — DIPHENHYDRAMINE HCL 25 MG PO CAPS
ORAL_CAPSULE | ORAL | Status: AC
  Filled 2017-07-20: qty 1

## 2017-07-20 MED ORDER — RITUXIMAB CHEMO INJECTION 500 MG/50ML
500.0000 mg | Freq: Once | INTRAVENOUS | Status: AC
  Administered 2017-07-20: 500 mg via INTRAVENOUS
  Filled 2017-07-20: qty 50

## 2017-07-20 MED ORDER — ACETAMINOPHEN 325 MG PO TABS
650.0000 mg | ORAL_TABLET | Freq: Once | ORAL | Status: AC
  Administered 2017-07-20: 650 mg via ORAL

## 2017-07-20 MED ORDER — SODIUM CHLORIDE 0.9 % IV SOLN
Freq: Once | INTRAVENOUS | Status: AC
  Administered 2017-07-20: 12:00:00 via INTRAVENOUS

## 2017-07-20 MED ORDER — DIPHENHYDRAMINE HCL 50 MG/ML IJ SOLN
25.0000 mg | Freq: Once | INTRAMUSCULAR | Status: AC
  Administered 2017-07-20: 25 mg via INTRAVENOUS

## 2017-07-20 MED ORDER — ACETAMINOPHEN 325 MG PO TABS
ORAL_TABLET | ORAL | Status: AC
  Filled 2017-07-20: qty 2

## 2017-07-20 NOTE — Telephone Encounter (Signed)
1045-Called Grace Moyer to let her know that we could fit her in for her Rituxan today. She said she will be here in 30 minutes due to where she lives.  The infusion center had cancellations so we were able to fit her in today. Gardiner Rhyme

## 2017-07-20 NOTE — Patient Instructions (Signed)
[facility] Discharge Instructions for Patients Receiving Chemotherapy  Today you received the following chemotherapy agents rituxan.   To help prevent nausea and vomiting after your treatment, we encourage you to take your nausea medication as directed.     If you develop nausea and vomiting that is not controlled by your nausea medication, call the clinic.   BELOW ARE SYMPTOMS THAT SHOULD BE REPORTED IMMEDIATELY:  *FEVER GREATER THAN 100.5 F  *CHILLS WITH OR WITHOUT FEVER  NAUSEA AND VOMITING THAT IS NOT CONTROLLED WITH YOUR NAUSEA MEDICATION  *UNUSUAL SHORTNESS OF BREATH  *UNUSUAL BRUISING OR BLEEDING  TENDERNESS IN MOUTH AND THROAT WITH OR WITHOUT PRESENCE OF ULCERS  *URINARY PROBLEMS  *BOWEL PROBLEMS  UNUSUAL RASH Items with * indicate a potential emergency and should be followed up as soon as possible.  Feel free to call the clinic you have any questions or concerns. The clinic phone number is [phone].  

## 2017-08-26 ENCOUNTER — Encounter: Payer: Self-pay | Admitting: Hematology

## 2017-08-26 ENCOUNTER — Inpatient Hospital Stay: Payer: 59 | Attending: Adult Health

## 2017-08-26 ENCOUNTER — Inpatient Hospital Stay (HOSPITAL_BASED_OUTPATIENT_CLINIC_OR_DEPARTMENT_OTHER): Payer: 59 | Admitting: Hematology

## 2017-08-26 VITALS — BP 135/76 | HR 61 | Temp 99.0°F | Resp 18 | Ht 63.0 in | Wt 205.1 lb

## 2017-08-26 DIAGNOSIS — Z171 Estrogen receptor negative status [ER-]: Secondary | ICD-10-CM | POA: Insufficient documentation

## 2017-08-26 DIAGNOSIS — C50412 Malignant neoplasm of upper-outer quadrant of left female breast: Secondary | ICD-10-CM

## 2017-08-26 DIAGNOSIS — Z87891 Personal history of nicotine dependence: Secondary | ICD-10-CM | POA: Insufficient documentation

## 2017-08-26 DIAGNOSIS — G360 Neuromyelitis optica [Devic]: Secondary | ICD-10-CM

## 2017-08-26 DIAGNOSIS — C773 Secondary and unspecified malignant neoplasm of axilla and upper limb lymph nodes: Secondary | ICD-10-CM | POA: Insufficient documentation

## 2017-08-26 DIAGNOSIS — T451X5A Adverse effect of antineoplastic and immunosuppressive drugs, initial encounter: Secondary | ICD-10-CM

## 2017-08-26 DIAGNOSIS — Z86718 Personal history of other venous thrombosis and embolism: Secondary | ICD-10-CM | POA: Diagnosis not present

## 2017-08-26 DIAGNOSIS — G620 Drug-induced polyneuropathy: Secondary | ICD-10-CM

## 2017-08-26 LAB — COMPREHENSIVE METABOLIC PANEL
ALT: 32 U/L (ref 0–44)
AST: 19 U/L (ref 15–41)
Albumin: 4.3 g/dL (ref 3.5–5.0)
Alkaline Phosphatase: 140 U/L — ABNORMAL HIGH (ref 38–126)
Anion gap: 9 (ref 5–15)
BUN: 13 mg/dL (ref 6–20)
CO2: 25 mmol/L (ref 22–32)
Calcium: 9.5 mg/dL (ref 8.9–10.3)
Chloride: 106 mmol/L (ref 98–111)
Creatinine, Ser: 0.94 mg/dL (ref 0.44–1.00)
GFR calc Af Amer: >60 mL/min (ref >60)
GFR calc non Af Amer: >60 mL/min (ref >60)
Glucose, Bld: 77 mg/dL (ref 70–99)
Potassium: 3.9 mmol/L (ref 3.5–5.1)
Sodium: 140 mmol/L (ref 135–145)
Total Bilirubin: 0.6 mg/dL (ref 0.3–1.2)
Total Protein: 7 g/dL (ref 6.5–8.1)

## 2017-08-26 LAB — CBC WITH DIFFERENTIAL/PLATELET
Basophils Absolute: 0 10*3/uL (ref 0.0–0.1)
Basophils Relative: 1 %
Eosinophils Absolute: 0.1 10*3/uL (ref 0.0–0.5)
Eosinophils Relative: 2 %
HCT: 39.4 % (ref 34.8–46.6)
Hemoglobin: 13.5 g/dL (ref 11.6–15.9)
Lymphocytes Relative: 17 %
Lymphs Abs: 0.8 10*3/uL — ABNORMAL LOW (ref 0.9–3.3)
MCH: 29.8 pg (ref 25.1–34.0)
MCHC: 34.2 g/dL (ref 31.5–36.0)
MCV: 87.2 fL (ref 79.5–101.0)
Monocytes Absolute: 0.5 10*3/uL (ref 0.1–0.9)
Monocytes Relative: 10 %
Neutro Abs: 3.4 10*3/uL (ref 1.5–6.5)
Neutrophils Relative %: 70 %
Platelets: 168 10*3/uL (ref 145–400)
RBC: 4.52 MIL/uL (ref 3.70–5.45)
RDW: 13.3 % (ref 11.2–14.5)
WBC: 4.8 10*3/uL (ref 3.9–10.3)

## 2017-08-26 NOTE — Progress Notes (Signed)
[place]  Telephone:([phone]) [phone] [phone]) [phone]  Clinic Follow up Note   Patient Care Team: Maisie Fus, MD as PCP - General (Obstetrics and Gynecology) Collene Gobble, MD as Attending Physician (Psychiatry) Rolm Bookbinder, MD as Consulting Physician (General Surgery) Truitt Merle, MD as Consulting Physician (Hematology) Kyung Rudd, MD as Consulting Physician (Radiation Oncology) Gardenia Phlegm, NP as Nurse Practitioner (Hematology and Oncology) 08/26/2017   CHIEF COMPLAINTS:  Follow up left breast triple negative cancer  Oncology History   Cancer Staging Malignant neoplasm of upper-outer quadrant of left female breast [facility]) Staging form: Breast, AJCC 7th Edition - Clinical stage from 01/29/2016: Stage IIB (T2, N1, M0) - Signed by Truitt Merle, MD on 02/06/2016 - Pathologic stage from 07/31/2016: T0, N0, cM0 - Signed by Truitt Merle, MD on 12/31/2016       Malignant neoplasm of upper-outer quadrant of left female breast ([place])   01/28/2016 Mammogram    Diagnostic MM and US showed a 2.9cm (3.2X2.2X2.5cm on [place]) mass in UOQ, multiple enlarged left axillary nodes, largest 2.5cm.       01/29/2016 Initial Diagnosis    Malignant neoplasm of upper-outer quadrant of left female breast ([place])      01/29/2016 Initial Biopsy    Left breast mass and axillary node biopsy showed IDC, G3,       01/29/2016 Receptors her2    ER-, PR-, HER2-, Ki67 85%      02/11/2016 Imaging    Bilateral breast MRI with and without contrast showed a 3.3 x 2.6 x 2.6 cm mass in the upper outer left breast, and left axillary lymphadenopathy, at least 3 abnormal lymph nodes, no other additional sites of concern.      02/12/2016 Imaging    CT chest, abdomen and pelvis with contrast showed a 2.4 x 2.7 cm lesion in the upper outer left breast, left axillary node metastasis measuring up to 1.3 cm, no evidence of distant metastasis.      02/12/2016 Imaging    Bone scan was  negative for skeletal metastasis.      02/13/2016 - 07/03/2016 Neo-Adjuvant Chemotherapy    Dose dense Adriamycin 60 mg/m, Cytoxan 600 mg/m, every 2 weeks, for 4 cycles, followed by weekly carboplatin and Taxol for 12 weeks       03/27/2016 Genetic Testing    Patient has genetic testing done for personal history of breast cancer, family history of cancer. ATM c.2606C>T VUS identified on the Breast/GYN panel.  Negative genetic testing for the MSH2 inversion analysis (Boland inversion). The Breast/GYN gene panel offered by [facility] includes sequencing and rearrangement analysis for the following 23 genes:  ATM, BARD1, BRCA1, BRCA2, BRIP1, CDH1, CHEK2, EPCAM, FANCC, MLH1, MSH2, MSH6, MUTYH, NBN, NF1, PALB2, PMS2, POLD1, PTEN, RAD51C, RAD51D, RECQL, and TP53.         07/11/2016 Imaging    Bilateral Breast MRI FINDINGS: Breast composition: c. Heterogeneous fibroglandular tissue.  Background parenchymal enhancement: Minimal.  Right breast: No mass or abnormal enhancement.  Left breast: No mass or abnormal enhancement. Biopsy clip is identified at the posterior left breast upper outer quadrant. The previously noted enhancing mass is not seen currently.  Lymph nodes: No abnormal appearing lymph nodes. Previously noted abnormal lymph nodes in the left axilla are currently normal size.  Ancillary findings:  None.  IMPRESSION: Known cancer.      07/31/2016 Pathology Results    Diagnosis 1. Breast, lumpectomy, Left - LOBULAR NEOPLASIA (ATYPICAL LOBULAR HYPERPLASIA). - FIBROCYSTIC CHANGES WITH ADENOSIS  AND CALCIFICATIONS. - [place]. - SEE ONCOLOGY TABLE BELOW. 2. Breast, excision, Left additional Medial Margin - LOBULAR NEOPLASIA (ATYPICAL LOBULAR HYPERPLASIA). - FIBROCYSTIC CHANGES WITH ADENOSIS AND CALCIFICATIONS. - RADIAL SCAR. - HEALING BIOPSY SITE. - SEE COMMENT. 3. Lymph node, sentinel, biopsy, Left - THERE IS NO EVIDENCE OF CARCINOMA IN 1 OF 1 LYMPH  NODE (0/1) - CHANGES CONSISTENT WITH PRIOR PROCEDURE. 4. Lymph node, sentinel, biopsy, Left - THERE IS NO EVIDENCE OF CARCINOMA IN 1 OF 1 LYMPH NODE (0/1) 5. Lymph node, sentinel, biopsy, Left - THERE IS NO EVIDENCE OF CARCINOMA IN 1 OF 1 LYMPH NODE (0/1) 6. Lymph node, sentinel, biopsy, Left - THERE IS NO EVIDENCE OF CARCINOMA IN 1 OF 1 LYMPH NODE (0/1) 7. Lymph node, sentinel, biopsy, Left - THERE IS NO EVIDENCE OF CARCINOMA IN 1 OF 1 LYMPH NODE (0/1)      07/31/2016 Surgery    LEFT BREAST RADIOACTIVE SEED GUIDED LUMPECTOMY WITH LEFT RADIOACTIVE SEED TARGETED AXILLARY SENTINEL LYMPH NODE EXCISION AND SENTINEL LYMPH NODE BIOPSY and port removal done by Dr. Donne Hazel.        08/05/2016 Pathology Results    Surgical pathology  Diagnosis 1. Breast, Mammoplasty, Left - SCLEROSING ADENOSIS WITH CALCIFICATIONS - FIBROCYSTIC CHANGES - DUCT ECTASIA - NO MALIGNANCY IDENTIFIED 2. Breast, Mammoplasty, Right - SCLEROSING ADENOSIS WITH CALCIFICATIONS - FIBROCYSTIC CHANGES - DUCT ECTASIA - NO MALIGNANCY IDENTIFIED      08/05/2016 Surgery    LEFT ONCOPLASTIC REDUCTION; RIGHT BREAST REDUCTION by Dr. Iran Planas       09/11/2016 - 10/27/2016 Radiation Therapy    Radiation with Dr. Lisbeth Renshaw  Radiation treatment dates:   09/11/2016 to 10/27/2016  Site/dose:    1. The Left breast was treated to 50.4 Gy in 28 fractions at 1.8 Gy per fraction. 2. The Left Sclav was treated to 50.4 Gy in 28 fractions at 1.8 Gy per fraction. 3. The Left breast was boosted to 10 Gy in 5 fractions at 2 Gy per fraction.   Beams/energy:    1. 3D // 10X, 15X 2. photon // 15X, 6X  Narrative: The patient tolerated radiation treatment relatively well.  She developed mild erythema within the treatment field without any moist desquamation. Using Radiaplex as directed and using neosporin under inframammary fold where skin has peeled.       01/19/2017 Mammogram    IMPRESSION: No mammographic evidence of malignancy, status  post left lumpectomy and bilateral reduction mammoplasty.      HISTORY OF PRESENTING ILLNESS (02/06/16) :  Grace Moyer 57 y.o. female is here because of her recently diagnosed left breast cancer. She was accompanied By her husband to our multidisciplinary breast clinic today.  This was discovered by screening mammogram, she had no palpable breast mass or axilla note. She denies any other new symptoms. She underwent a diagnostic mammogram and ultrasound on 01/28/2016, which showed a 2.9 cm mass in the upper outer quadrant, and multiple enlarged left axillary nodes, largest 2.5 cm. She underwent core needle biopsy of the left breast mass and left axillary node, both showed invasive ductal carcinoma, grade 3, triple negative, Ki-67 85%.  She developed bilateral numbness and tingling of her legs and hands at the end of May 2017. She underwent a multiple scans, and lab test, and was finally seen by neurologist Dr. Moshe Cipro, and was diagnosed with neuromyelitis optica spectrum disorder (NOSD). She initially received high-dose steroids, and then started Rituxan infusion, status post 2 infusions, now is on every four-month  schedule, next due in Jan 2018. She states her neuropathy has been stable, she denies any significant vision problem, or other neurological symptoms. Her hand function and gait are normal. She is an Clinical biochemist.   GYN HISTORY  Menarchal: 13 LMP: 40 (hysterectomy) Contraceptive: 3 years  HRT: n/a  G1P1: 33 yo daughter   CURRENT THERAPY:  1. Rituxan injection every 4 months for NOSD 2. Xarelto started in 08/1026. Continue until 01/2017, then change to baby aspirin once daily.   INTERIM HISTORY  Grace Moyer returns for follow-up. She presents to the clinic today by herself. She feels well. Her left arm is painful and stiff w/o limited ROM. She can perform her daily activities normally. She is concerned that it might be lymphedema. Good appetite and weight is stable.   MEDICAL  HISTORY:  Past Medical History:  Diagnosis Date  . Anxiety   . Breast cancer ([place])   . Family history of breast cancer   . Family history of colon cancer   . Malignant neoplasm of upper-outer quadrant of left female breast ([place]) 01/31/2016  . Neuromyelitis optica ([place])   . Personal history of chemotherapy   . Personal history of radiation therapy     SURGICAL HISTORY: Past Surgical History:  Procedure Laterality Date  . ABDOMINAL HYSTERECTOMY     partial  . BREAST LUMPECTOMY Left    2018  . BREAST LUMPECTOMY WITH RADIOACTIVE SEED AND SENTINEL LYMPH NODE BIOPSY Left 07/31/2016   Procedure: LEFT BREAST RADIOACTIVE SEED GUIDED LUMPECTOMY WITH LEFT RADIOACTIVE SEED TARGETED AXILLARY SENTINEL LYMPH NODE EXCISION AND SENTINEL LYMPH NODE BIOPSY;  Surgeon: Rolm Bookbinder, MD;  Location: [facility];  Service: General;  Laterality: Left;  . BREAST REDUCTION SURGERY Bilateral 08/05/2016   Procedure: LEFT ONCOPLASTIC REDUCTION; RIGHT BREAST REDUCTION;  Surgeon: Irene Limbo, MD;  Location: [facility];  Service: Plastics;  Laterality: Bilateral;  . EYE SURGERY    . PORT-A-CATH REMOVAL Right 07/31/2016   Procedure: REMOVAL PORT-A-CATH;  Surgeon: Rolm Bookbinder, MD;  Location: [facility];  Service: General;  Laterality: Right;  . PORTACATH PLACEMENT Right 02/11/2016   Procedure: INSERTION PORT-A-CATH WITH [place];  Surgeon: Rolm Bookbinder, MD;  Location: [facility];  Service: General;  Laterality: Right;  . REDUCTION MAMMAPLASTY      SOCIAL HISTORY: Social History   Socioeconomic History  . Marital status: Married    Spouse name: Not on file  . Number of children: Not on file  . Years of education: Not on file  . Highest education level: Not on file  Occupational History  . Not on file  Social Needs  . Financial resource strain: Not on file  . Food insecurity:    Worry: Not on file    Inability: Not on file  .  Transportation needs:    Medical: Not on file    Non-medical: Not on file  Tobacco Use  . Smoking status: Former Smoker    Types: Cigarettes    Last attempt to quit: 09/03/2001    Years since quitting: 15.9  . Smokeless tobacco: Never Used  Substance and Sexual Activity  . Alcohol use: Yes    Comment: social  . Drug use: No  . Sexual activity: Not on file  Lifestyle  . Physical activity:    Days per week: Not on file    Minutes per session: Not on file  . Stress: Not on file  Relationships  . Social connections:  Talks on phone: Not on file    Gets together: Not on file    Attends religious service: Not on file    Active member of club or organization: Not on file    Attends meetings of clubs or organizations: Not on file    Relationship status: Not on file  . Intimate partner violence:    Fear of current or ex partner: Not on file    Emotionally abused: Not on file    Physically abused: Not on file    Forced sexual activity: Not on file  Other Topics Concern  . Not on file  Social History Narrative  . Not on file    FAMILY HISTORY: Family History  Problem Relation Age of Onset  . Breast cancer Maternal Grandmother        dx in her 46s  . CAD Paternal Grandmother   . Colon cancer Paternal Uncle        dx in his 68s  . COPD Maternal Grandfather   . CAD Paternal Grandfather   . Breast cancer Cousin        dx in her early to mid 56s; maternal first cousin  . Lung cancer Paternal Uncle     ALLERGIES:  is allergic to hydrocodone.  MEDICATIONS:  Current Outpatient Medications  Medication Sig Dispense Refill  . aspirin 81 MG tablet Take 1 tablet (81 mg total) by mouth daily. 30 tablet   . Cholecalciferol (VITAMIN D-1000 MAX ST) 1000 units tablet Take 2 tablets by mouth daily.     No current facility-administered medications for this visit.    Facility-Administered Medications Ordered in Other Visits  Medication Dose Route Frequency Provider Last Rate Last Dose   . heparin lock flush 100 unit/mL  500 Units Intravenous Once PRN Truitt Merle, MD      . sodium chloride flush (NS) 0.9 % injection 10 mL  10 mL Intracatheter PRN Truitt Merle, MD   10 mL at 06/18/16 1540  . sodium chloride flush (NS) 0.9 % injection 10 mL  10 mL Intravenous PRN Truitt Merle, MD      . sodium chloride flush (NS) 0.9 % injection 10 mL  10 mL Intravenous PRN Truitt Merle, MD   10 mL at 06/25/16 1106    REVIEW OF SYSTEMS:  Constitutional: Denies fevers, chills or abnormal night sweats, good appetite and weight Eyes: Denies blurriness of vision, double vision or watery eyes Ears, nose, mouth, throat, and face: Denies mucositis or sore throat Respiratory: Denies cough, dyspnea or wheezes Cardiovascular: Denies palpitation, chest discomfort or lower extremity swelling Gastrointestinal:  Denies nausea, heartburn Skin: negative Lymphatics: Denies new lymphadenopathy or easy bruising Neurological (+) tingling in fingers/toes, neuropathy stable Behavioral/Psych: negative MSK: (+) mild left arm pain  All other systems were reviewed with the patient and are negative.  PHYSICAL EXAMINATION: ECOG PERFORMANCE STATUS: 0 Vitals:   08/26/17 1528  BP: 135/76  Pulse: 61  Resp: 18  Temp: 99 F (37.2 C)  TempSrc: Oral  SpO2: 100%  Weight: 205 lb 1.6 oz (93 kg)  Height: 5' 3"  (1.6 m)     GENERAL:alert, no distress and comfortable.  SKIN: skin color, texture, turgor are normal, no rashes, Skin darkness and draining from previous cyst incision In right lower buttock EYES: normal, conjunctiva are pink and non-injected, sclera clear OROPHARYNX:no exudate, no erythema and lips, buccal mucosa, and tongue normal  NECK: supple, thyroid normal size, non-tender, without nodularity. LYMPH:  no palpable lymphadenopathy in the cervical,  axillary or inguinal  LUNGS: clear to auscultation and percussion with normal breathing effort HEART: regular rate & rhythm and no murmurs and no lower extremity  edema ABDOMEN:abdomen soft, non-tender and normal bowel sounds MUSCULOSKELETALl:no cyanosis of digits and no clubbing  PSYCH: alert & oriented x 3 with fluent speech NEURO: no focal motor/sensory deficits BREAST:  Surgical site healing nicely.  Bilateral breast and axillary exam showed no palpable mass or adenopathy. Lower extremity: No leg edema bilaterally. No calf tenderness or redness.    LABORATORY DATA:  I have reviewed the data as listed CBC Latest Ref Rng & Units 08/26/2017 06/26/2017 04/29/2017  WBC 3.9 - 10.3 K/uL 4.8 4.8 2.9(L)  Hemoglobin 11.6 - 15.9 g/dL 13.5 13.3 12.7  Hematocrit 34.8 - 46.6 % 39.4 39.1 37.8  Platelets 145 - 400 K/uL 168 155 136(L)   CMP Latest Ref Rng & Units 08/26/2017 06/26/2017 04/29/2017  Glucose 70 - 99 mg/dL 77 75 94  BUN 6 - 20 mg/dL 13 11 15   Creatinine 0.44 - 1.00 mg/dL 0.94 0.91 0.91  Sodium 135 - 145 mmol/L 140 141 144  Potassium 3.5 - 5.1 mmol/L 3.9 3.5 4.5  Chloride 98 - 111 mmol/L 106 107 109  CO2 22 - 32 mmol/L 25 25 24   Calcium 8.9 - 10.3 mg/dL 9.5 9.8 9.5  Total Protein 6.5 - 8.1 g/dL 7.0 7.1 6.9  Total Bilirubin 0.3 - 1.2 mg/dL 0.6 0.5 0.4  Alkaline Phos 38 - 126 U/L 140(H) 103 179(H)  AST 15 - 41 U/L 19 21 44(H)  ALT 0 - 44 U/L 32 25 67(H)    PATHOLOGY REPORT   Diagnosis 08/05/16 1. Breast, Mammoplasty, Left - SCLEROSING ADENOSIS WITH CALCIFICATIONS - FIBROCYSTIC CHANGES - DUCT ECTASIA - NO MALIGNANCY IDENTIFIED 2. Breast, Mammoplasty, Right - SCLEROSING ADENOSIS WITH CALCIFICATIONS - FIBROCYSTIC CHANGES - DUCT ECTASIA - NO MALIGNANCY IDENTIFIED  Diagnosis 07/31/16 1. Breast, lumpectomy, Left - LOBULAR NEOPLASIA (ATYPICAL LOBULAR HYPERPLASIA). - FIBROCYSTIC CHANGES WITH ADENOSIS AND CALCIFICATIONS. - [place]. - SEE ONCOLOGY TABLE BELOW. 2. Breast, excision, Left additional Medial Margin - LOBULAR NEOPLASIA (ATYPICAL LOBULAR HYPERPLASIA). - FIBROCYSTIC CHANGES WITH ADENOSIS AND CALCIFICATIONS. -  RADIAL SCAR. - HEALING BIOPSY SITE. - SEE COMMENT. 3. Lymph node, sentinel, biopsy, Left - THERE IS NO EVIDENCE OF CARCINOMA IN 1 OF 1 LYMPH NODE (0/1) - CHANGES CONSISTENT WITH PRIOR PROCEDURE. 4. Lymph node, sentinel, biopsy, Left - THERE IS NO EVIDENCE OF CARCINOMA IN 1 OF 1 LYMPH NODE (0/1) 5. Lymph node, sentinel, biopsy, Left - THERE IS NO EVIDENCE OF CARCINOMA IN 1 OF 1 LYMPH NODE (0/1) 6. Lymph node, sentinel, biopsy, Left - THERE IS NO EVIDENCE OF CARCINOMA IN 1 OF 1 LYMPH NODE (0/1) 7. Lymph node, sentinel, biopsy, Left - THERE IS NO EVIDENCE OF CARCINOMA IN 1 OF 1 LYMPH NODE (0/1) Microscopic Comment 1. BREAST, STATUS POST NEOADJUVANT TREATMENT Procedure: Seed localized lumpectomy, additional medial margin resection and multiple lymph node resections. Laterality: Left Tumor Size: N/A Histologic Type: N/A Grade: N/A Microscopic Comment(continued) Ductal Carcinoma in Situ (DCIS): Not identified. Regional Lymph Nodes: Number of Lymph Nodes Examined: 5 Number of Sentinel Lymph Nodes Examined: 5 Lymph Nodes with Micrometastases: 0 Lymph Nodes with Micrometastases: 0 Lymph Nodes with Isolated Tumor Cells: 0 Margins: N/A Extent of Tumor: N/A Breast Prognostic Profile (pre-neoadjuvant case #: [id number]) Estrogen Receptor: 0%. Progesterone Receptor: less than 1%. Her2: No amplification was detected. Ki-67: 85% Residual Cancer Burden (RCB): This case is considered a complete  pathologic response. Pathologic Stage Classification (p TNM, AJCC 8th Edition): Primary Tumor (ypT): ypT0 Regional Lymph Nodes (ypN): ypN0 (JBK:gt, 08/04/16) Comments: The breast tissue and lymph nodal tissue both show significant treatment related c   Diagnosis 01/29/2016 1. Breast, left, needle core biopsy, 1:30 o'clock - INVASIVE DUCTAL CARCINOMA, GRADE 3. - LYMPHOVASCULAR INVOLVEMENT BY TUMOR. 2. Lymph node, needle/core biopsy, left axillary - METASTATIC CARCINOMA.  RADIOGRAPHIC  STUDIES: I have personally reviewed the radiological images as listed and agreed with the findings in the report. No results found.  Diagnostic Mammogram 01/19/17 IMPRESSION: No mammographic evidence of malignancy, status post left lumpectomy and bilateral reduction mammoplasty.  Breast MRI 07/11/16 IMPRESSION: Known cancer.  Bone scan 02/12/2016 IMPRESSION: No evidence of skeletal metastatic disease.  CT chest abdomen pelvis w contrast 02/12/2016 IMPRESSION: 2.4 x 2.7 cm lesion in the upper outer left breast, corresponding to the patient's known breast neoplasm. Left axillary nodal metastasis measuring up to 13 mm short axis. No evidence of distant metastasis.  ASSESSMENT & PLAN: Grace Moyer is a  57 y.o. Caucasian woman with past medical history of depression, presented with a screening mammogram discovered left breast cancer   1. Malignant neoplasm of upper-outer quadrant of left breast, invasive ductal carcinoma, G3, cT2N1M0, stage IIB, ER-/PR-/HER2-, ypT0N0 -She received neoadjuvant chemotherapy with dose dense Adriamycin, Cytoxan every 2 weeks, with Neulasta support, for 4 cycles, followed by weekly carboplatin and Taxol for 12 weeks she tolerated well overall.,   -She had surgery 07/31/16 and breast reduction 08/05/16. Pathology was discussed with pt previously and showed no residual cancer after neoadjuvant chemotherapy, which is an excellent prognostic factor for good outcome.  -Giving the complete pathologic response, I do not recommend adjuvant Xeloda.  -She underwent radiation with Dr. Lisbeth Renshaw on 09/11/16-10/27/16, tolerated well.  --She is clinically doing well. Her mammogram from 01/19/17 was negative for malignancy.  Lab reviewed, no concerns, exam was unremarkable.  No clinical concern for recurrence. -Continue breast cancer surveillance, mammogram annually -F/u in 4 months, then every 6 months after    2. Left LE DVT and bilateral acute submassive PE, provoked by  surgeries admitted 08/15/16 -She developed extensive left lower extremity DVT with bilateral acute submassive PE, requiring hospitalization -This is probably provoked by her breast surgery and inmobility -I previously recommend her to remain on Xarelto for 6 months. Then remain on baby aspirin. She was tolerating Xarelto well. Precautions for fall and injury were discussed with patient. -She completed Xarelto in 01/2017 and then proceeded with baby aspirin once daily.  -She is having right calf tightness and tenderness today, I sent her for US Doppler of the bilateral extremity to rule out DVT at [facility]   3. Genetics -Given her young age and triple negative disease, we recommend her to have genetic testing to rule out inheritable breast cancer syndrome, and the test result may impact her surgery. -ATM c.2606C>T VUS identified on the Breast/Gyn panel.  Negative genetic testing for the MSH2 inversion analysis ([place] inversion). The Breast/GYN gene panel offered by [facility] includes sequencing and rearrangement analysis for the following 23 genes:  ATM, BARD1, BRCA1, BRCA2, BRIP1, CDH1, CHEK2, EPCAM, FANCC, MLH1, MSH2, MSH6, MUTYH, NBN, NF1, PALB2, PMS2, POLD1, PTEN, RAD51C, RAD51D, RECQL, and TP53.   The report date is March 27, 2016  4. neuromyelitis optica spectrum disorder (NOSD) -she will continue follow up with her neurologist Dr. Moshe Cipro at [facility]  -she is on rituximab every 4 weeks. It will be OK to continue her Rituximab  when she is on chemotherapy, but the combination therapy will further compromise her immune system. -I have previously spoken with Dr. Moshe Cipro about her breast cancer treatment, he is agreeable. -She previously received Rituxan in our cancer center on 03/21/2016, tolerated generally well. She prefers to get next dose Rituxan here with [place], which she received on 11/07/16 -She will continue to receive Rituxan injection through our clinic every 4 months -next due in Sep 2019  4.  Peripheral neuropathy, G1 -Her tingling and numbness got slightly worse towards the end of chemotherapy, likely related to her chemotherapy -No need for Neurontin at this point, continue monitoring. -She still has residual neuropathy from treatment. She does not want to take Gabapentin. Continue B complex.    Plan -Lab, rituxumab around 9/20 -F/u in 4 months (no lab needed)   No orders of the defined types were placed in this encounter.   All questions were answered. The patient knows to call the clinic with any problems, questions or concerns.  I spent 15 minutes counseling the patient face to face. The total time spent in the appointment was 20 minutes and more than 50% was on counseling.  Dierdre Searles Dweik am acting as scribe for Dr. Truitt Merle.  I have reviewed the above documentation for accuracy and completeness, and I agree with the above.   Truitt Merle  08/26/2017

## 2017-10-21 DIAGNOSIS — M654 Radial styloid tenosynovitis [de Quervain]: Secondary | ICD-10-CM | POA: Diagnosis not present

## 2017-11-10 ENCOUNTER — Other Ambulatory Visit: Payer: Self-pay | Admitting: Obstetrics & Gynecology

## 2017-11-10 DIAGNOSIS — Z9889 Other specified postprocedural states: Secondary | ICD-10-CM

## 2017-11-20 ENCOUNTER — Inpatient Hospital Stay: Payer: 59 | Attending: Hematology

## 2017-11-20 ENCOUNTER — Encounter: Payer: Self-pay | Admitting: Medical Oncology

## 2017-11-20 ENCOUNTER — Inpatient Hospital Stay: Payer: 59

## 2017-11-20 VITALS — BP 105/60 | HR 60 | Temp 98.4°F | Resp 18

## 2017-11-20 DIAGNOSIS — Z23 Encounter for immunization: Secondary | ICD-10-CM

## 2017-11-20 DIAGNOSIS — Z923 Personal history of irradiation: Secondary | ICD-10-CM | POA: Insufficient documentation

## 2017-11-20 DIAGNOSIS — G360 Neuromyelitis optica [Devic]: Secondary | ICD-10-CM

## 2017-11-20 DIAGNOSIS — I2699 Other pulmonary embolism without acute cor pulmonale: Secondary | ICD-10-CM | POA: Insufficient documentation

## 2017-11-20 DIAGNOSIS — C773 Secondary and unspecified malignant neoplasm of axilla and upper limb lymph nodes: Secondary | ICD-10-CM | POA: Insufficient documentation

## 2017-11-20 DIAGNOSIS — G629 Polyneuropathy, unspecified: Secondary | ICD-10-CM | POA: Insufficient documentation

## 2017-11-20 DIAGNOSIS — Z87891 Personal history of nicotine dependence: Secondary | ICD-10-CM | POA: Diagnosis not present

## 2017-11-20 DIAGNOSIS — Z171 Estrogen receptor negative status [ER-]: Secondary | ICD-10-CM | POA: Diagnosis not present

## 2017-11-20 DIAGNOSIS — F419 Anxiety disorder, unspecified: Secondary | ICD-10-CM | POA: Diagnosis not present

## 2017-11-20 DIAGNOSIS — Z5112 Encounter for antineoplastic immunotherapy: Secondary | ICD-10-CM | POA: Diagnosis not present

## 2017-11-20 DIAGNOSIS — C50412 Malignant neoplasm of upper-outer quadrant of left female breast: Secondary | ICD-10-CM | POA: Diagnosis not present

## 2017-11-20 LAB — COMPREHENSIVE METABOLIC PANEL
ALT: 21 U/L (ref 0–44)
AST: 16 U/L (ref 15–41)
Albumin: 3.9 g/dL (ref 3.5–5.0)
Alkaline Phosphatase: 118 U/L (ref 38–126)
Anion gap: 8 (ref 5–15)
BUN: 17 mg/dL (ref 6–20)
CO2: 24 mmol/L (ref 22–32)
Calcium: 9.1 mg/dL (ref 8.9–10.3)
Chloride: 108 mmol/L (ref 98–111)
Creatinine, Ser: 0.99 mg/dL (ref 0.44–1.00)
GFR calc Af Amer: >60 mL/min (ref >60)
GFR calc non Af Amer: >60 mL/min (ref >60)
Glucose, Bld: 81 mg/dL (ref 70–99)
Potassium: 4.1 mmol/L (ref 3.5–5.1)
Sodium: 140 mmol/L (ref 135–145)
Total Bilirubin: 0.4 mg/dL (ref 0.3–1.2)
Total Protein: 6.7 g/dL (ref 6.5–8.1)

## 2017-11-20 LAB — CBC WITH DIFFERENTIAL/PLATELET
Basophils Absolute: 0 10*3/uL (ref 0.0–0.1)
Basophils Relative: 0 %
Eosinophils Absolute: 0.1 10*3/uL (ref 0.0–0.5)
Eosinophils Relative: 1 %
HCT: 40.7 % (ref 34.8–46.6)
Hemoglobin: 13.5 g/dL (ref 11.6–15.9)
Lymphocytes Relative: 20 %
Lymphs Abs: 1 10*3/uL (ref 0.9–3.3)
MCH: 29.4 pg (ref 25.1–34.0)
MCHC: 33.2 g/dL (ref 31.5–36.0)
MCV: 88.7 fL (ref 79.5–101.0)
Monocytes Absolute: 0.4 10*3/uL (ref 0.1–0.9)
Monocytes Relative: 8 %
Neutro Abs: 3.5 10*3/uL (ref 1.5–6.5)
Neutrophils Relative %: 71 %
Platelets: 151 10*3/uL (ref 145–400)
RBC: 4.59 MIL/uL (ref 3.70–5.45)
RDW: 13.1 % (ref 11.2–14.5)
WBC: 4.9 10*3/uL (ref 3.9–10.3)

## 2017-11-20 MED ORDER — INFLUENZA VAC SPLIT QUAD 0.5 ML IM SUSY
PREFILLED_SYRINGE | INTRAMUSCULAR | Status: AC
  Filled 2017-11-20: qty 0.5

## 2017-11-20 MED ORDER — DIPHENHYDRAMINE HCL 50 MG/ML IJ SOLN
INTRAMUSCULAR | Status: AC
  Filled 2017-11-20: qty 1

## 2017-11-20 MED ORDER — SODIUM CHLORIDE 0.9 % IV SOLN
500.0000 mg | Freq: Once | INTRAVENOUS | Status: AC
  Administered 2017-11-20: 500 mg via INTRAVENOUS
  Filled 2017-11-20: qty 50

## 2017-11-20 MED ORDER — ACETAMINOPHEN 325 MG PO TABS
650.0000 mg | ORAL_TABLET | Freq: Once | ORAL | Status: AC
  Administered 2017-11-20: 650 mg via ORAL

## 2017-11-20 MED ORDER — SODIUM CHLORIDE 0.9 % IV SOLN
Freq: Once | INTRAVENOUS | Status: AC
  Administered 2017-11-20: 13:00:00 via INTRAVENOUS
  Filled 2017-11-20: qty 250

## 2017-11-20 MED ORDER — INFLUENZA VAC SPLIT QUAD 0.5 ML IM SUSY
0.5000 mL | PREFILLED_SYRINGE | Freq: Once | INTRAMUSCULAR | Status: AC
  Administered 2017-11-20: 0.5 mL via INTRAMUSCULAR

## 2017-11-20 MED ORDER — DIPHENHYDRAMINE HCL 50 MG/ML IJ SOLN
25.0000 mg | Freq: Once | INTRAMUSCULAR | Status: AC
  Administered 2017-11-20: 25 mg via INTRAVENOUS

## 2017-11-20 MED ORDER — ACETAMINOPHEN 325 MG PO TABS
ORAL_TABLET | ORAL | Status: AC
  Filled 2017-11-20: qty 2

## 2017-11-20 NOTE — Patient Instructions (Signed)
[facility] Discharge Instructions for Patients Receiving Chemotherapy  Today you received the following chemotherapy agents:  Rituxan.  To help prevent nausea and vomiting after your treatment, we encourage you to take your nausea medication as directed.   If you develop nausea and vomiting that is not controlled by your nausea medication, call the clinic.   BELOW ARE SYMPTOMS THAT SHOULD BE REPORTED IMMEDIATELY:  *FEVER GREATER THAN 100.5 F  *CHILLS WITH OR WITHOUT FEVER  NAUSEA AND VOMITING THAT IS NOT CONTROLLED WITH YOUR NAUSEA MEDICATION  *UNUSUAL SHORTNESS OF BREATH  *UNUSUAL BRUISING OR BLEEDING  TENDERNESS IN MOUTH AND THROAT WITH OR WITHOUT PRESENCE OF ULCERS  *URINARY PROBLEMS  *BOWEL PROBLEMS  UNUSUAL RASH Items with * indicate a potential emergency and should be followed up as soon as possible.  Feel free to call the clinic should you have any questions or concerns. The clinic phone number is [phone].  Please show the CHEMO ALERT CARD at check-in to the Emergency Department and triage nurse.   

## 2017-12-21 NOTE — Progress Notes (Signed)
[place]  Telephone:([phone] [phone]  Clinic Follow up Note   Patient Care Team: Maisie Fus, MD as PCP - General (Obstetrics and Gynecology) Collene Gobble, MD as Attending Physician (Psychiatry) Rolm Bookbinder, MD as Consulting Physician (General Surgery) Truitt Merle, MD as Consulting Physician (Hematology) Kyung Rudd, MD as Consulting Physician (Radiation Oncology) Gardenia Phlegm, NP as Nurse Practitioner (Hematology and Oncology) 12/21/2017   CHIEF COMPLAINTS:  Follow up left breast triple negative cancer  Oncology History   Cancer Staging Malignant neoplasm of upper-outer quadrant of left female breast [facility]) Staging form: Breast, AJCC 7th Edition - Clinical stage from 01/29/2016: Stage IIB (T2, N1, M0) - Signed by Truitt Merle, MD on 02/06/2016 - Pathologic stage from 07/31/2016: T0, N0, cM0 - Signed by Truitt Merle, MD on 12/31/2016       Malignant neoplasm of upper-outer quadrant of left female breast ([place])   01/28/2016 Mammogram    Diagnostic MM and US showed a 2.9cm (3.2X2.2X2.5cm on [place]) mass in UOQ, multiple enlarged left axillary nodes, largest 2.5cm.     01/29/2016 Initial Diagnosis    Malignant neoplasm of upper-outer quadrant of left female breast ([place])    01/29/2016 Initial Biopsy    Left breast mass and axillary node biopsy showed IDC, G3,     01/29/2016 Receptors her2    ER-, PR-, HER2-, Ki67 85%    02/11/2016 Imaging    Bilateral breast MRI with and without contrast showed a 3.3 x 2.6 x 2.6 cm mass in the upper outer left breast, and left axillary lymphadenopathy, at least 3 abnormal lymph nodes, no other additional sites of concern.    02/12/2016 Imaging    CT chest, abdomen and pelvis with contrast showed a 2.4 x 2.7 cm lesion in the upper outer left breast, left axillary node metastasis measuring up to 1.3 cm, no evidence of distant metastasis.    02/12/2016 Imaging    Bone scan was negative for skeletal  metastasis.    02/13/2016 - 07/03/2016 Neo-Adjuvant Chemotherapy    Dose dense Adriamycin 60 mg/m, Cytoxan 600 mg/m, every 2 weeks, for 4 cycles, followed by weekly carboplatin and Taxol for 12 weeks     03/27/2016 Genetic Testing    Patient has genetic testing done for personal history of breast cancer, family history of cancer. ATM c.2606C>T VUS identified on the Breast/GYN panel.  Negative genetic testing for the MSH2 inversion analysis (Boland inversion). The Breast/GYN gene panel offered by [facility] includes sequencing and rearrangement analysis for the following 23 genes:  ATM, BARD1, BRCA1, BRCA2, BRIP1, CDH1, CHEK2, EPCAM, FANCC, MLH1, MSH2, MSH6, MUTYH, NBN, NF1, PALB2, PMS2, POLD1, PTEN, RAD51C, RAD51D, RECQL, and TP53.       07/11/2016 Imaging    Bilateral Breast MRI FINDINGS: Breast composition: c. Heterogeneous fibroglandular tissue.  Background parenchymal enhancement: Minimal.  Right breast: No mass or abnormal enhancement.  Left breast: No mass or abnormal enhancement. Biopsy clip is identified at the posterior left breast upper outer quadrant. The previously noted enhancing mass is not seen currently.  Lymph nodes: No abnormal appearing lymph nodes. Previously noted abnormal lymph nodes in the left axilla are currently normal size.  Ancillary findings:  None.  IMPRESSION: Known cancer.    07/31/2016 Pathology Results    Diagnosis 1. Breast, lumpectomy, Left - LOBULAR NEOPLASIA (ATYPICAL LOBULAR HYPERPLASIA). - FIBROCYSTIC CHANGES WITH ADENOSIS AND CALCIFICATIONS. - [place]. - SEE ONCOLOGY TABLE BELOW. 2. Breast, excision, Left additional Medial Margin -  LOBULAR NEOPLASIA (ATYPICAL LOBULAR HYPERPLASIA). - FIBROCYSTIC CHANGES WITH ADENOSIS AND CALCIFICATIONS. - RADIAL SCAR. - HEALING BIOPSY SITE. - SEE COMMENT. 3. Lymph node, sentinel, biopsy, Left - THERE IS NO EVIDENCE OF CARCINOMA IN 1 OF 1 LYMPH NODE (0/1) - CHANGES CONSISTENT  WITH PRIOR PROCEDURE. 4. Lymph node, sentinel, biopsy, Left - THERE IS NO EVIDENCE OF CARCINOMA IN 1 OF 1 LYMPH NODE (0/1) 5. Lymph node, sentinel, biopsy, Left - THERE IS NO EVIDENCE OF CARCINOMA IN 1 OF 1 LYMPH NODE (0/1) 6. Lymph node, sentinel, biopsy, Left - THERE IS NO EVIDENCE OF CARCINOMA IN 1 OF 1 LYMPH NODE (0/1) 7. Lymph node, sentinel, biopsy, Left - THERE IS NO EVIDENCE OF CARCINOMA IN 1 OF 1 LYMPH NODE (0/1)    07/31/2016 Surgery    LEFT BREAST RADIOACTIVE SEED GUIDED LUMPECTOMY WITH LEFT RADIOACTIVE SEED TARGETED AXILLARY SENTINEL LYMPH NODE EXCISION AND SENTINEL LYMPH NODE BIOPSY and port removal done by Dr. Donne Hazel.      08/05/2016 Pathology Results    Surgical pathology  Diagnosis 1. Breast, Mammoplasty, Left - SCLEROSING ADENOSIS WITH CALCIFICATIONS - FIBROCYSTIC CHANGES - DUCT ECTASIA - NO MALIGNANCY IDENTIFIED 2. Breast, Mammoplasty, Right - SCLEROSING ADENOSIS WITH CALCIFICATIONS - FIBROCYSTIC CHANGES - DUCT ECTASIA - NO MALIGNANCY IDENTIFIED    08/05/2016 Surgery    LEFT ONCOPLASTIC REDUCTION; RIGHT BREAST REDUCTION by Dr. Iran Planas     09/11/2016 - 10/27/2016 Radiation Therapy    Radiation with Dr. Lisbeth Renshaw  Radiation treatment dates:   09/11/2016 to 10/27/2016  Site/dose:    1. The Left breast was treated to 50.4 Gy in 28 fractions at 1.8 Gy per fraction. 2. The Left Sclav was treated to 50.4 Gy in 28 fractions at 1.8 Gy per fraction. 3. The Left breast was boosted to 10 Gy in 5 fractions at 2 Gy per fraction.   Beams/energy:    1. 3D // 10X, 15X 2. photon // 15X, 6X  Narrative: The patient tolerated radiation treatment relatively well.  She developed mild erythema within the treatment field without any moist desquamation. Using Radiaplex as directed and using neosporin under inframammary fold where skin has peeled.     01/19/2017 Mammogram    IMPRESSION: No mammographic evidence of malignancy, status post left lumpectomy and bilateral reduction  mammoplasty.    HISTORY OF PRESENTING ILLNESS (02/06/16) :  Noralee Space 57 y.o. female is here because of her recently diagnosed left breast cancer. She was accompanied By her husband to our multidisciplinary breast clinic today.  This was discovered by screening mammogram, she had no palpable breast mass or axilla note. She denies any other new symptoms. She underwent a diagnostic mammogram and ultrasound on 01/28/2016, which showed a 2.9 cm mass in the upper outer quadrant, and multiple enlarged left axillary nodes, largest 2.5 cm. She underwent core needle biopsy of the left breast mass and left axillary node, both showed invasive ductal carcinoma, grade 3, triple negative, Ki-67 85%.  She developed bilateral numbness and tingling of her legs and hands at the end of May 2017. She underwent a multiple scans, and lab test, and was finally seen by neurologist Dr. Moshe Cipro, and was diagnosed with neuromyelitis optica spectrum disorder (NOSD). She initially received high-dose steroids, and then started Rituxan infusion, status post 2 infusions, now is on every four-month schedule, next due in Jan 2018. She states her neuropathy has been stable, she denies any significant vision problem, or other neurological symptoms. Her hand function and gait are normal. She is  an Clinical biochemist.   GYN HISTORY  Menarchal: 13 LMP: 32 (hysterectomy) Contraceptive: 3 years  HRT: n/a  G1P1: 68 yo daughter   CURRENT THERAPY:  1. Rituxan injection every 4 months for NOSD 2. Xarelto started in 08/1026. Continue until 01/2017, then change to baby aspirin once daily.   INTERIM HISTORY  KIMLA FURTH returns for follow-up. She was last seen by me 4 months ago. She has been doing well, no new complaints.  Her peripheral neuropathy has not improved, she has mild tingling, but her hand function is normal, only on her toes is less.  She is wondering if she needs to take Neurontin.  She otherwise has no other complaints.  She  has good appetite, energy level, functions very well.   MEDICAL HISTORY:  Past Medical History:  Diagnosis Date  . Anxiety   . Breast cancer ([place])   . Family history of breast cancer   . Family history of colon cancer   . Malignant neoplasm of upper-outer quadrant of left female breast ([place]) 01/31/2016  . Neuromyelitis optica ([place])   . Personal history of chemotherapy   . Personal history of radiation therapy     SURGICAL HISTORY: Past Surgical History:  Procedure Laterality Date  . ABDOMINAL HYSTERECTOMY     partial  . BREAST LUMPECTOMY Left    2018  . BREAST LUMPECTOMY WITH RADIOACTIVE SEED AND SENTINEL LYMPH NODE BIOPSY Left 07/31/2016   Procedure: LEFT BREAST RADIOACTIVE SEED GUIDED LUMPECTOMY WITH LEFT RADIOACTIVE SEED TARGETED AXILLARY SENTINEL LYMPH NODE EXCISION AND SENTINEL LYMPH NODE BIOPSY;  Surgeon: Rolm Bookbinder, MD;  Location: [place];  Service: General;  Laterality: Left;  . BREAST REDUCTION SURGERY Bilateral 08/05/2016   Procedure: LEFT ONCOPLASTIC REDUCTION; RIGHT BREAST REDUCTION;  Surgeon: Irene Limbo, MD;  Location: [place];  Service: Plastics;  Laterality: Bilateral;  . EYE SURGERY    . PORT-A-CATH REMOVAL Right 07/31/2016   Procedure: REMOVAL PORT-A-CATH;  Surgeon: Rolm Bookbinder, MD;  Location: [place];  Service: General;  Laterality: Right;  . PORTACATH PLACEMENT Right 02/11/2016   Procedure: INSERTION PORT-A-CATH WITH [place];  Surgeon: Rolm Bookbinder, MD;  Location: [facility];  Service: General;  Laterality: Right;  . REDUCTION MAMMAPLASTY      SOCIAL HISTORY: Social History   Socioeconomic History  . Marital status: Married    Spouse name: Not on file  . Number of children: Not on file  . Years of education: Not on file  . Highest education level: Not on file  Occupational History  . Not on file  Social Needs  . Financial resource strain: Not on file  . Food  insecurity:    Worry: Not on file    Inability: Not on file  . Transportation needs:    Medical: Not on file    Non-medical: Not on file  Tobacco Use  . Smoking status: Former Smoker    Types: Cigarettes    Last attempt to quit: 09/03/2001    Years since quitting: 16.3  . Smokeless tobacco: Never Used  Substance and Sexual Activity  . Alcohol use: Yes    Comment: social  . Drug use: No  . Sexual activity: Not on file  Lifestyle  . Physical activity:    Days per week: Not on file    Minutes per session: Not on file  . Stress: Not on file  Relationships  . Social connections:    Talks on phone: Not on file  Gets together: Not on file    Attends religious service: Not on file    Active member of club or organization: Not on file    Attends meetings of clubs or organizations: Not on file    Relationship status: Not on file  . Intimate partner violence:    Fear of current or ex partner: Not on file    Emotionally abused: Not on file    Physically abused: Not on file    Forced sexual activity: Not on file  Other Topics Concern  . Not on file  Social History Narrative  . Not on file    FAMILY HISTORY: Family History  Problem Relation Age of Onset  . Breast cancer Maternal Grandmother        dx in her 62s  . CAD Paternal Grandmother   . Colon cancer Paternal Uncle        dx in his 65s  . COPD Maternal Grandfather   . CAD Paternal Grandfather   . Breast cancer Cousin        dx in her early to mid 62s; maternal first cousin  . Lung cancer Paternal Uncle     ALLERGIES:  is allergic to hydrocodone.  MEDICATIONS:  Current Outpatient Medications  Medication Sig Dispense Refill  . aspirin 81 MG tablet Take 1 tablet (81 mg total) by mouth daily. 30 tablet   . Cholecalciferol (VITAMIN D-1000 MAX ST) 1000 units tablet Take 2 tablets by mouth daily.     No current facility-administered medications for this visit.    Facility-Administered Medications Ordered in Other  Visits  Medication Dose Route Frequency Provider Last Rate Last Dose  . heparin lock flush 100 unit/mL  500 Units Intravenous Once PRN Truitt Merle, MD      . sodium chloride flush (NS) 0.9 % injection 10 mL  10 mL Intracatheter PRN Truitt Merle, MD   10 mL at 06/18/16 1540  . sodium chloride flush (NS) 0.9 % injection 10 mL  10 mL Intravenous PRN Truitt Merle, MD      . sodium chloride flush (NS) 0.9 % injection 10 mL  10 mL Intravenous PRN Truitt Merle, MD   10 mL at 06/25/16 1106    REVIEW OF SYSTEMS:  Constitutional: Denies fevers, chills or abnormal night sweats, good appetite and weight Eyes: Denies blurriness of vision, double vision or watery eyes Ears, nose, mouth, throat, and face: Denies mucositis or sore throat Respiratory: Denies cough, dyspnea or wheezes Cardiovascular: Denies palpitation, chest discomfort or lower extremity swelling Gastrointestinal:  Denies nausea, heartburn Skin: negative Lymphatics: Denies new lymphadenopathy or easy bruising Neurological (+) tingling in fingers/toes, neuropathy stable Behavioral/Psych: negative MSK: (+) mild left arm pain  All other systems were reviewed with the patient and are negative.  PHYSICAL EXAMINATION: ECOG PERFORMANCE STATUS: 0 There were no vitals filed for this visit.   GENERAL:alert, no distress and comfortable.  SKIN: skin color, texture, turgor are normal, no rashes, Skin darkness and draining from previous cyst incision In right lower buttock EYES: normal, conjunctiva are pink and non-injected, sclera clear OROPHARYNX:no exudate, no erythema and lips, buccal mucosa, and tongue normal  NECK: supple, thyroid normal size, non-tender, without nodularity. LYMPH:  no palpable lymphadenopathy in the cervical, axillary or inguinal  LUNGS: clear to auscultation and percussion with normal breathing effort HEART: regular rate & rhythm and no murmurs and no lower extremity edema ABDOMEN:abdomen soft, non-tender and normal bowel  sounds MUSCULOSKELETALl:no cyanosis of digits and no clubbing  PSYCH: alert & oriented x 3 with fluent speech NEURO: no focal motor/sensory deficits BREAST:  Surgical site healing nicely.  Bilateral breast and axillary exam showed no palpable mass or adenopathy. Lower extremity: No leg edema bilaterally. No calf tenderness or redness.    LABORATORY DATA:  I have reviewed the data as listed CBC Latest Ref Rng & Units 11/20/2017 08/26/2017 06/26/2017  WBC 3.9 - 10.3 K/uL 4.9 4.8 4.8  Hemoglobin 11.6 - 15.9 g/dL 13.5 13.5 13.3  Hematocrit 34.8 - 46.6 % 40.7 39.4 39.1  Platelets 145 - 400 K/uL 151 168 155   CMP Latest Ref Rng & Units 11/20/2017 08/26/2017 06/26/2017  Glucose 70 - 99 mg/dL 81 77 75  BUN 6 - 20 mg/dL 17 13 11   Creatinine 0.44 - 1.00 mg/dL 0.99 0.94 0.91  Sodium 135 - 145 mmol/L 140 140 141  Potassium 3.5 - 5.1 mmol/L 4.1 3.9 3.5  Chloride 98 - 111 mmol/L 108 106 107  CO2 22 - 32 mmol/L 24 25 25   Calcium 8.9 - 10.3 mg/dL 9.1 9.5 9.8  Total Protein 6.5 - 8.1 g/dL 6.7 7.0 7.1  Total Bilirubin 0.3 - 1.2 mg/dL 0.4 0.6 0.5  Alkaline Phos 38 - 126 U/L 118 140(H) 103  AST 15 - 41 U/L 16 19 21   ALT 0 - 44 U/L 21 32 25    PATHOLOGY REPORT   Diagnosis 08/05/16 1. Breast, Mammoplasty, Left - SCLEROSING ADENOSIS WITH CALCIFICATIONS - FIBROCYSTIC CHANGES - DUCT ECTASIA - NO MALIGNANCY IDENTIFIED 2. Breast, Mammoplasty, Right - SCLEROSING ADENOSIS WITH CALCIFICATIONS - FIBROCYSTIC CHANGES - DUCT ECTASIA - NO MALIGNANCY IDENTIFIED  Diagnosis 07/31/16 1. Breast, lumpectomy, Left - LOBULAR NEOPLASIA (ATYPICAL LOBULAR HYPERPLASIA). - FIBROCYSTIC CHANGES WITH ADENOSIS AND CALCIFICATIONS. - [place]. - SEE ONCOLOGY TABLE BELOW. 2. Breast, excision, Left additional Medial Margin - LOBULAR NEOPLASIA (ATYPICAL LOBULAR HYPERPLASIA). - FIBROCYSTIC CHANGES WITH ADENOSIS AND CALCIFICATIONS. - RADIAL SCAR. - HEALING BIOPSY SITE. - SEE COMMENT. 3. Lymph node,  sentinel, biopsy, Left - THERE IS NO EVIDENCE OF CARCINOMA IN 1 OF 1 LYMPH NODE (0/1) - CHANGES CONSISTENT WITH PRIOR PROCEDURE. 4. Lymph node, sentinel, biopsy, Left - THERE IS NO EVIDENCE OF CARCINOMA IN 1 OF 1 LYMPH NODE (0/1) 5. Lymph node, sentinel, biopsy, Left - THERE IS NO EVIDENCE OF CARCINOMA IN 1 OF 1 LYMPH NODE (0/1) 6. Lymph node, sentinel, biopsy, Left - THERE IS NO EVIDENCE OF CARCINOMA IN 1 OF 1 LYMPH NODE (0/1) 7. Lymph node, sentinel, biopsy, Left - THERE IS NO EVIDENCE OF CARCINOMA IN 1 OF 1 LYMPH NODE (0/1) Microscopic Comment 1. BREAST, STATUS POST NEOADJUVANT TREATMENT Procedure: Seed localized lumpectomy, additional medial margin resection and multiple lymph node resections. Laterality: Left Tumor Size: N/A Histologic Type: N/A Grade: N/A Microscopic Comment(continued) Ductal Carcinoma in Situ (DCIS): Not identified. Regional Lymph Nodes: Number of Lymph Nodes Examined: 5 Number of Sentinel Lymph Nodes Examined: 5 Lymph Nodes with Micrometastases: 0 Lymph Nodes with Micrometastases: 0 Lymph Nodes with Isolated Tumor Cells: 0 Margins: N/A Extent of Tumor: N/A Breast Prognostic Profile (pre-neoadjuvant case #: [id number]) Estrogen Receptor: 0%. Progesterone Receptor: less than 1%. Her2: No amplification was detected. Ki-67: 85% Residual Cancer Burden (RCB): This case is considered a complete pathologic response. Pathologic Stage Classification (p TNM, AJCC 8th Edition): Primary Tumor (ypT): ypT0 Regional Lymph Nodes (ypN): ypN0 (JBK:gt, 08/04/16) Comments: The breast tissue and lymph nodal tissue both show significant treatment related c   Diagnosis 01/29/2016 1. Breast, left, needle  core biopsy, 1:30 o'clock - INVASIVE DUCTAL CARCINOMA, GRADE 3. - LYMPHOVASCULAR INVOLVEMENT BY TUMOR. 2. Lymph node, needle/core biopsy, left axillary - METASTATIC CARCINOMA.  RADIOGRAPHIC STUDIES: I have personally reviewed the radiological images as listed and  agreed with the findings in the report. No results found.  Diagnostic Mammogram 01/19/17 IMPRESSION: No mammographic evidence of malignancy, status post left lumpectomy and bilateral reduction mammoplasty.  Breast MRI 07/11/16 IMPRESSION: Known cancer.  Bone scan 02/12/2016 IMPRESSION: No evidence of skeletal metastatic disease.  CT chest abdomen pelvis w contrast 02/12/2016 IMPRESSION: 2.4 x 2.7 cm lesion in the upper outer left breast, corresponding to the patient's known breast neoplasm. Left axillary nodal metastasis measuring up to 13 mm short axis. No evidence of distant metastasis.  ASSESSMENT & PLAN: MAYSUN MEDITZ is a  57 y.o. Caucasian woman with past medical history of depression, presented with a screening mammogram discovered left breast cancer   1. Malignant neoplasm of upper-outer quadrant of left breast, invasive ductal carcinoma, G3, cT2N1M0, stage IIB, ER-/PR-/HER2-, ypT0N0 -She received neoadjuvant chemotherapy with dose dense Adriamycin, Cytoxan every 2 weeks, with Neulasta support, for 4 cycles, followed by weekly carboplatin and Taxol for 12 weeks she tolerated well overall.,   -She had surgery 07/31/16 and breast reduction 08/05/16. Pathology was discussed with pt previously and showed no residual cancer after neoadjuvant chemotherapy, which is an excellent prognostic factor for good outcome.  -Giving the complete pathologic response, I do not recommend adjuvant Xeloda.  -She underwent radiation with Dr. Lisbeth Renshaw on 09/11/16-10/27/16, tolerated well.  --She is clinically doing well. Her mammogram from 01/19/17 was negative for malignancy.   -lab from last month reviewed, CBC and CMP are WNL  -Continue breast cancer surveillance, mammogram annually, is scheduled for next month  -F/u in 6 months    2. Left LE DVT and bilateral acute submassive PE, provoked by surgeries admitted 08/15/16 -She developed extensive left lower extremity DVT with bilateral acute submassive  PE, requiring hospitalization -This is probably provoked by her breast surgery and inmobility -I previously recommend her to remain on Xarelto for 6 months. Then remain on baby aspirin. She was tolerating Xarelto well. Precautions for fall and injury were discussed with patient. -She completed Xarelto in 01/2017 and then proceeded with baby aspirin once daily.  -She previously had right calf tightness and tenderness, I sent her for US Doppler of the bilateral extremity to rule out DVT at [facility], which were negative for DVT.  3. Genetics -Given her young age and triple negative disease, we recommend her to have genetic testing to rule out inheritable breast cancer syndrome, and the test result may impact her surgery. -ATM c.2606C>T VUS identified on the Breast/Gyn panel.  Negative genetic testing for the MSH2 inversion analysis ([place] inversion). The Breast/GYN gene panel offered by [facility] includes sequencing and rearrangement analysis for the following 23 genes:  ATM, BARD1, BRCA1, BRCA2, BRIP1, CDH1, CHEK2, EPCAM, FANCC, MLH1, MSH2, MSH6, MUTYH, NBN, NF1, PALB2, PMS2, POLD1, PTEN, RAD51C, RAD51D, RECQL, and TP53.   The report date is March 27, 2016  4. neuromyelitis optica spectrum disorder (NOSD) -she will continue follow up with her neurologist Dr. Moshe Cipro at [facility]  -she is on rituximab every 4 weeks. It will be OK to continue her Rituximab when she is on chemotherapy, but the combination therapy will further compromise her immune system. -I have previously spoken with Dr. Moshe Cipro about her breast cancer treatment, he is agreeable. -She previously received Rituxan in our cancer center on 03/21/2016,  tolerated generally well. She prefers to get next dose Rituxan here with [place], which she received on 11/07/16 -She will continue to receive Rituxan injection through our clinic every 4 months, next due in Jan 2020   4. Peripheral neuropathy, G1 -she previously had neuropathy from NOSD. Her tingling and  numbness got slightly worse towards the end of chemotherapy, likely related to her chemotherapy -She has persistent tingling on her fingers, she would like to try gabapentin, I called into her pharmacy today.  Danger side effects discussed with her, especially drowsiness.  She will start at 100 mg at night, and increase 100 mg every week to 300-454m.    Plan -Lab, rituxumab around Jan 2020 -f/u in 6 months  -Mammogram next month.   No orders of the defined types were placed in this encounter.   All questions were answered. The patient knows to call the clinic with any problems, questions or concerns.  I spent 20 minutes counseling the patient face to face. The total time spent in the appointment was 25 minutes and more than 50% was on counseling.  IDierdre SearlesDweik am acting as scribe for Dr. YTruitt Merle  I have reviewed the above documentation for accuracy and completeness, and I agree with the above.   YTruitt Merle 12/21/2017

## 2017-12-23 ENCOUNTER — Inpatient Hospital Stay: Payer: 59 | Attending: Hematology | Admitting: Hematology

## 2017-12-23 VITALS — BP 126/71 | HR 55 | Temp 98.5°F | Resp 17 | Ht 63.0 in | Wt 209.8 lb

## 2017-12-23 DIAGNOSIS — Z171 Estrogen receptor negative status [ER-]: Secondary | ICD-10-CM | POA: Insufficient documentation

## 2017-12-23 DIAGNOSIS — F419 Anxiety disorder, unspecified: Secondary | ICD-10-CM | POA: Insufficient documentation

## 2017-12-23 DIAGNOSIS — Z86718 Personal history of other venous thrombosis and embolism: Secondary | ICD-10-CM | POA: Diagnosis not present

## 2017-12-23 DIAGNOSIS — F329 Major depressive disorder, single episode, unspecified: Secondary | ICD-10-CM | POA: Insufficient documentation

## 2017-12-23 DIAGNOSIS — Z923 Personal history of irradiation: Secondary | ICD-10-CM

## 2017-12-23 DIAGNOSIS — G629 Polyneuropathy, unspecified: Secondary | ICD-10-CM | POA: Insufficient documentation

## 2017-12-23 DIAGNOSIS — C773 Secondary and unspecified malignant neoplasm of axilla and upper limb lymph nodes: Secondary | ICD-10-CM | POA: Diagnosis not present

## 2017-12-23 DIAGNOSIS — Z87891 Personal history of nicotine dependence: Secondary | ICD-10-CM | POA: Insufficient documentation

## 2017-12-23 DIAGNOSIS — I2699 Other pulmonary embolism without acute cor pulmonale: Secondary | ICD-10-CM

## 2017-12-23 DIAGNOSIS — C50412 Malignant neoplasm of upper-outer quadrant of left female breast: Secondary | ICD-10-CM | POA: Diagnosis not present

## 2017-12-23 MED ORDER — GABAPENTIN 100 MG PO CAPS: 100.0000 mg | ORAL_CAPSULE | Freq: Three times a day (TID) | ORAL | 0 refills | Status: DC

## 2017-12-24 ENCOUNTER — Encounter: Payer: Self-pay | Admitting: Hematology

## 2018-01-20 ENCOUNTER — Ambulatory Visit
Admission: RE | Admit: 2018-01-20 | Discharge: 2018-01-20 | Disposition: A | Discharge status: Home / Self Care | Payer: 59 | Source: Ambulatory Visit | Attending: Obstetrics & Gynecology | Admitting: Obstetrics & Gynecology

## 2018-01-20 DIAGNOSIS — R922 Inconclusive mammogram: Secondary | ICD-10-CM | POA: Diagnosis not present

## 2018-01-20 DIAGNOSIS — Z9889 Other specified postprocedural states: Secondary | ICD-10-CM

## 2018-01-20 DIAGNOSIS — Z853 Personal history of malignant neoplasm of breast: Secondary | ICD-10-CM | POA: Diagnosis not present

## 2018-01-20 DIAGNOSIS — Z6837 Body mass index (BMI) 37.0-37.9, adult: Secondary | ICD-10-CM | POA: Diagnosis not present

## 2018-01-20 DIAGNOSIS — Z01419 Encounter for gynecological examination (general) (routine) without abnormal findings: Secondary | ICD-10-CM | POA: Diagnosis not present

## 2018-02-01 DIAGNOSIS — G369 Acute disseminated demyelination, unspecified: Secondary | ICD-10-CM | POA: Diagnosis not present

## 2018-02-25 DIAGNOSIS — R350 Frequency of micturition: Secondary | ICD-10-CM | POA: Diagnosis not present

## 2018-02-25 DIAGNOSIS — C50919 Malignant neoplasm of unspecified site of unspecified female breast: Secondary | ICD-10-CM | POA: Diagnosis not present

## 2018-02-25 DIAGNOSIS — E6609 Other obesity due to excess calories: Secondary | ICD-10-CM | POA: Diagnosis not present

## 2018-02-25 DIAGNOSIS — N342 Other urethritis: Secondary | ICD-10-CM | POA: Diagnosis not present

## 2018-03-24 DIAGNOSIS — L819 Disorder of pigmentation, unspecified: Secondary | ICD-10-CM | POA: Diagnosis not present

## 2018-03-24 DIAGNOSIS — Z6835 Body mass index (BMI) 35.0-35.9, adult: Secondary | ICD-10-CM | POA: Diagnosis not present

## 2018-03-24 DIAGNOSIS — L570 Actinic keratosis: Secondary | ICD-10-CM | POA: Diagnosis not present

## 2018-03-24 DIAGNOSIS — E6609 Other obesity due to excess calories: Secondary | ICD-10-CM | POA: Diagnosis not present

## 2018-03-24 DIAGNOSIS — D485 Neoplasm of uncertain behavior of skin: Secondary | ICD-10-CM | POA: Diagnosis not present

## 2018-03-24 DIAGNOSIS — Z1389 Encounter for screening for other disorder: Secondary | ICD-10-CM | POA: Diagnosis not present

## 2018-03-24 DIAGNOSIS — D1801 Hemangioma of skin and subcutaneous tissue: Secondary | ICD-10-CM | POA: Diagnosis not present

## 2018-03-26 ENCOUNTER — Inpatient Hospital Stay: Payer: 59 | Attending: Hematology

## 2018-03-26 ENCOUNTER — Inpatient Hospital Stay: Payer: 59

## 2018-03-26 VITALS — BP 112/68 | HR 63 | Temp 98.6°F | Resp 18

## 2018-03-26 DIAGNOSIS — Z79899 Other long term (current) drug therapy: Secondary | ICD-10-CM | POA: Diagnosis not present

## 2018-03-26 DIAGNOSIS — Z171 Estrogen receptor negative status [ER-]: Secondary | ICD-10-CM | POA: Diagnosis not present

## 2018-03-26 DIAGNOSIS — C773 Secondary and unspecified malignant neoplasm of axilla and upper limb lymph nodes: Secondary | ICD-10-CM | POA: Insufficient documentation

## 2018-03-26 DIAGNOSIS — C50412 Malignant neoplasm of upper-outer quadrant of left female breast: Secondary | ICD-10-CM | POA: Diagnosis not present

## 2018-03-26 DIAGNOSIS — Z5112 Encounter for antineoplastic immunotherapy: Secondary | ICD-10-CM | POA: Diagnosis not present

## 2018-03-26 DIAGNOSIS — G360 Neuromyelitis optica [Devic]: Secondary | ICD-10-CM

## 2018-03-26 LAB — CBC WITH DIFFERENTIAL/PLATELET
Abs Immature Granulocytes: 0.01 10*3/uL (ref 0.00–0.07)
Basophils Absolute: 0 10*3/uL (ref 0.0–0.1)
Basophils Relative: 1 %
Eosinophils Absolute: 0.1 10*3/uL (ref 0.0–0.5)
Eosinophils Relative: 2 %
HCT: 41.1 % (ref 36.0–46.0)
Hemoglobin: 14 g/dL (ref 12.0–15.0)
Immature Granulocytes: 0 %
Lymphocytes Relative: 18 %
Lymphs Abs: 0.7 10*3/uL (ref 0.7–4.0)
MCH: 29.5 pg (ref 26.0–34.0)
MCHC: 34.1 g/dL (ref 30.0–36.0)
MCV: 86.7 fL (ref 80.0–100.0)
Monocytes Absolute: 0.4 10*3/uL (ref 0.1–1.0)
Monocytes Relative: 9 %
Neutro Abs: 2.8 10*3/uL (ref 1.7–7.7)
Neutrophils Relative %: 70 %
Platelets: 160 10*3/uL (ref 150–400)
RBC: 4.74 MIL/uL (ref 3.87–5.11)
RDW: 13 % (ref 11.5–15.5)
WBC: 4.1 10*3/uL (ref 4.0–10.5)
nRBC: 0 % (ref 0.0–0.2)

## 2018-03-26 LAB — COMPREHENSIVE METABOLIC PANEL
ALT: 35 U/L (ref 0–44)
AST: 23 U/L (ref 15–41)
Albumin: 4 g/dL (ref 3.5–5.0)
Alkaline Phosphatase: 127 U/L — ABNORMAL HIGH (ref 38–126)
Anion gap: 9 (ref 5–15)
BUN: 9 mg/dL (ref 6–20)
CO2: 25 mmol/L (ref 22–32)
Calcium: 9.3 mg/dL (ref 8.9–10.3)
Chloride: 107 mmol/L (ref 98–111)
Creatinine, Ser: 0.84 mg/dL (ref 0.44–1.00)
GFR calc Af Amer: >60 mL/min (ref >60)
GFR calc non Af Amer: >60 mL/min (ref >60)
Glucose, Bld: 97 mg/dL (ref 70–99)
Potassium: 3.9 mmol/L (ref 3.5–5.1)
Sodium: 141 mmol/L (ref 135–145)
Total Bilirubin: 0.6 mg/dL (ref 0.3–1.2)
Total Protein: 6.7 g/dL (ref 6.5–8.1)

## 2018-03-26 MED ORDER — DIPHENHYDRAMINE HCL 50 MG/ML IJ SOLN
25.0000 mg | Freq: Once | INTRAMUSCULAR | Status: AC
  Administered 2018-03-26: 25 mg via INTRAVENOUS

## 2018-03-26 MED ORDER — ACETAMINOPHEN 325 MG PO TABS
650.0000 mg | ORAL_TABLET | Freq: Once | ORAL | Status: AC
  Administered 2018-03-26: 650 mg via ORAL

## 2018-03-26 MED ORDER — SODIUM CHLORIDE 0.9 % IV SOLN
500.0000 mg | Freq: Once | INTRAVENOUS | Status: AC
  Administered 2018-03-26: 500 mg via INTRAVENOUS
  Filled 2018-03-26: qty 50

## 2018-03-26 MED ORDER — SODIUM CHLORIDE 0.9 % IV SOLN
Freq: Once | INTRAVENOUS | Status: AC
  Administered 2018-03-26: 12:00:00 via INTRAVENOUS
  Filled 2018-03-26: qty 250

## 2018-03-26 MED ORDER — ACETAMINOPHEN 325 MG PO TABS
ORAL_TABLET | ORAL | Status: AC
  Filled 2018-03-26: qty 2

## 2018-03-26 MED ORDER — DIPHENHYDRAMINE HCL 50 MG/ML IJ SOLN
INTRAMUSCULAR | Status: AC
  Filled 2018-03-26: qty 1

## 2018-03-26 NOTE — Patient Instructions (Signed)
[facility] Discharge Instructions for Patients Receiving Chemotherapy  Today you received the following chemotherapy agents:  Rituxan.  To help prevent nausea and vomiting after your treatment, we encourage you to take your nausea medication as directed.   If you develop nausea and vomiting that is not controlled by your nausea medication, call the clinic.   BELOW ARE SYMPTOMS THAT SHOULD BE REPORTED IMMEDIATELY:  *FEVER GREATER THAN 100.5 F  *CHILLS WITH OR WITHOUT FEVER  NAUSEA AND VOMITING THAT IS NOT CONTROLLED WITH YOUR NAUSEA MEDICATION  *UNUSUAL SHORTNESS OF BREATH  *UNUSUAL BRUISING OR BLEEDING  TENDERNESS IN MOUTH AND THROAT WITH OR WITHOUT PRESENCE OF ULCERS  *URINARY PROBLEMS  *BOWEL PROBLEMS  UNUSUAL RASH Items with * indicate a potential emergency and should be followed up as soon as possible.  Feel free to call the clinic should you have any questions or concerns. The clinic phone number is [phone].  Please show the CHEMO ALERT CARD at check-in to the Emergency Department and triage nurse.   

## 2018-06-05 IMAGING — NM NM BONE WHOLE BODY
2 series · 2 of 2 positions shown · non-contrast
Comparison: None.

CLINICAL DATA: Newly diagnosed invasive carcinoma of the left
breast.

EXAM:
NUCLEAR MEDICINE WHOLE BODY BONE SCAN
TECHNIQUE: Whole body anterior and posterior images were obtained approximately
3 hours after intravenous injection of radiopharmaceutical.
RADIOPHARMACEUTICALS:  19 mCi Lechnetium-YYm MDP IV

[Series 1: wbr_bone_40 whole body · 2.66mm/px · 1 of 1 slices shown (1 of 2)]
[im 1/1]
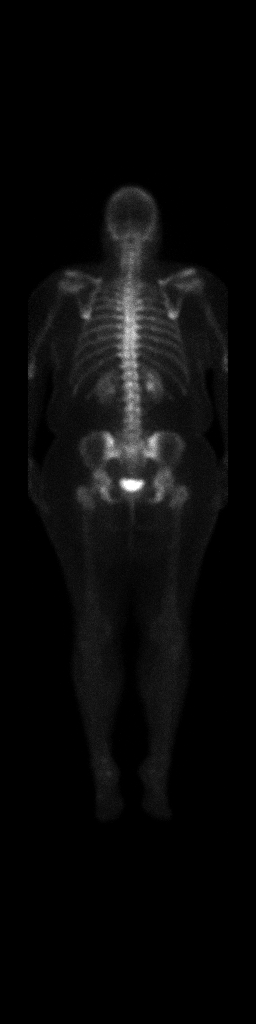

[Series 1: wbr_bone_40 whole body · 2.66mm/px · 1 of 1 slices shown (2 of 2)]
[im 1/1]
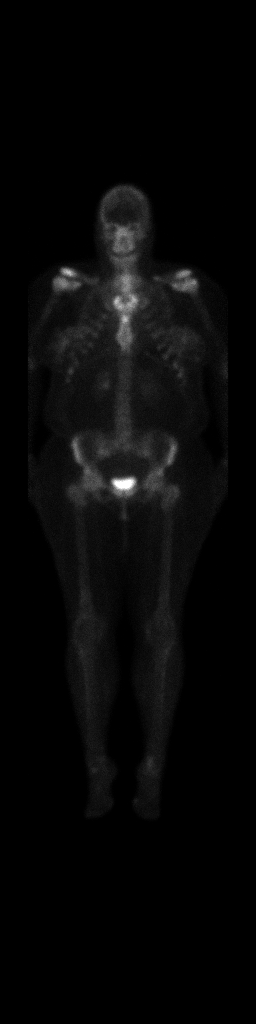

[2 of 2 positions shown; findings below may reference images not displayed]

FINDINGS: There is no evidence by bone scan of skeletal metastatic disease.
Degenerative activity is noted in both AC joints, glenohumeral
joints and hip joints. No visible abnormal soft tissue uptake.
Normal excretion of activity is noted by the kidneys and into the
bladder.
IMPRESSION: No evidence of skeletal metastatic disease.

## 2018-06-23 ENCOUNTER — Telehealth: Payer: Self-pay | Admitting: Hematology

## 2018-06-23 NOTE — Progress Notes (Signed)
[place]   Telephone:([phone]) [phone] [phone]) [phone]   Clinic Follow up Note   Patient Care Team: Maisie Fus, MD as PCP - General (Obstetrics and Gynecology) Collene Gobble, MD as Attending Physician (Psychiatry) Rolm Bookbinder, MD as Consulting Physician (General Surgery) Truitt Merle, MD as Consulting Physician (Hematology) Kyung Rudd, MD as Consulting Physician (Radiation Oncology) Delice Bison Charlestine Massed, NP as Nurse Practitioner (Hematology and Oncology)   I connected with Grace Moyer on 06/25/2018 at 11:15 AM EDT by video enabled telemedicine visit and verified that I am speaking with the correct person using two identifiers.  I discussed the limitations, risks, security and privacy concerns of performing an evaluation and management service by telephone and the availability of in person appointments. I also discussed with the patient that there may be a patient responsible charge related to this service. The patient expressed understanding and agreed to proceed.   Patient's location:  Her home  Provider's location:  My office   CHIEF COMPLAINT: Follow up left breast triple negative cancer  SUMMARY OF ONCOLOGIC HISTORY: Oncology History   Cancer Staging Malignant neoplasm of upper-outer quadrant of left female breast [facility]) Staging form: Breast, AJCC 7th Edition - Clinical stage from 01/29/2016: Stage IIB (T2, N1, M0) - Signed by Truitt Merle, MD on 02/06/2016 - Pathologic stage from 07/31/2016: T0, N0, cM0 - Signed by Truitt Merle, MD on 12/31/2016       Malignant neoplasm of upper-outer quadrant of left female breast ([place])   01/28/2016 Mammogram    Diagnostic MM and US showed a 2.9cm (3.2X2.2X2.5cm on [place]) mass in UOQ, multiple enlarged left axillary nodes, largest 2.5cm.     01/29/2016 Initial Diagnosis    Malignant neoplasm of upper-outer quadrant of left female breast ([place])    01/29/2016 Initial Biopsy    Left breast mass and axillary node  biopsy showed IDC, G3,     01/29/2016 Receptors her2    ER-, PR-, HER2-, Ki67 85%    02/11/2016 Imaging    Bilateral breast MRI with and without contrast showed a 3.3 x 2.6 x 2.6 cm mass in the upper outer left breast, and left axillary lymphadenopathy, at least 3 abnormal lymph nodes, no other additional sites of concern.    02/12/2016 Imaging    CT chest, abdomen and pelvis with contrast showed a 2.4 x 2.7 cm lesion in the upper outer left breast, left axillary node metastasis measuring up to 1.3 cm, no evidence of distant metastasis.    02/12/2016 Imaging    Bone scan was negative for skeletal metastasis.    02/13/2016 - 07/03/2016 Neo-Adjuvant Chemotherapy    Dose dense Adriamycin 60 mg/m, Cytoxan 600 mg/m, every 2 weeks, for 4 cycles, followed by weekly carboplatin and Taxol for 12 weeks     03/27/2016 Genetic Testing    Patient has genetic testing done for personal history of breast cancer, family history of cancer. ATM c.2606C>T VUS identified on the Breast/GYN panel.  Negative genetic testing for the MSH2 inversion analysis ([place] inversion). The Breast/GYN gene panel offered by [facility] includes sequencing and rearrangement analysis for the following 23 genes:  ATM, BARD1, BRCA1, BRCA2, BRIP1, CDH1, CHEK2, EPCAM, FANCC, MLH1, MSH2, MSH6, MUTYH, NBN, NF1, PALB2, PMS2, POLD1, PTEN, RAD51C, RAD51D, RECQL, and TP53.       07/11/2016 Imaging    Bilateral Breast MRI FINDINGS: Breast composition: c. Heterogeneous fibroglandular tissue.  Background parenchymal enhancement: Minimal.  Right breast: No mass or abnormal enhancement.  Left breast:  No mass or abnormal enhancement. Biopsy clip is identified at the posterior left breast upper outer quadrant. The previously noted enhancing mass is not seen currently.  Lymph nodes: No abnormal appearing lymph nodes. Previously noted abnormal lymph nodes in the left axilla are currently normal size.  Ancillary findings:  None.   IMPRESSION: Known cancer.    07/31/2016 Pathology Results    Diagnosis 1. Breast, lumpectomy, Left - LOBULAR NEOPLASIA (ATYPICAL LOBULAR HYPERPLASIA). - FIBROCYSTIC CHANGES WITH ADENOSIS AND CALCIFICATIONS. - [place]. - SEE ONCOLOGY TABLE BELOW. 2. Breast, excision, Left additional Medial Margin - LOBULAR NEOPLASIA (ATYPICAL LOBULAR HYPERPLASIA). - FIBROCYSTIC CHANGES WITH ADENOSIS AND CALCIFICATIONS. - RADIAL SCAR. - HEALING BIOPSY SITE. - SEE COMMENT. 3. Lymph node, sentinel, biopsy, Left - THERE IS NO EVIDENCE OF CARCINOMA IN 1 OF 1 LYMPH NODE (0/1) - CHANGES CONSISTENT WITH PRIOR PROCEDURE. 4. Lymph node, sentinel, biopsy, Left - THERE IS NO EVIDENCE OF CARCINOMA IN 1 OF 1 LYMPH NODE (0/1) 5. Lymph node, sentinel, biopsy, Left - THERE IS NO EVIDENCE OF CARCINOMA IN 1 OF 1 LYMPH NODE (0/1) 6. Lymph node, sentinel, biopsy, Left - THERE IS NO EVIDENCE OF CARCINOMA IN 1 OF 1 LYMPH NODE (0/1) 7. Lymph node, sentinel, biopsy, Left - THERE IS NO EVIDENCE OF CARCINOMA IN 1 OF 1 LYMPH NODE (0/1)    07/31/2016 Surgery    LEFT BREAST RADIOACTIVE SEED GUIDED LUMPECTOMY WITH LEFT RADIOACTIVE SEED TARGETED AXILLARY SENTINEL LYMPH NODE EXCISION AND SENTINEL LYMPH NODE BIOPSY and port removal done by Dr. Donne Hazel.      08/05/2016 Pathology Results    Surgical pathology  Diagnosis 1. Breast, Mammoplasty, Left - SCLEROSING ADENOSIS WITH CALCIFICATIONS - FIBROCYSTIC CHANGES - DUCT ECTASIA - NO MALIGNANCY IDENTIFIED 2. Breast, Mammoplasty, Right - SCLEROSING ADENOSIS WITH CALCIFICATIONS - FIBROCYSTIC CHANGES - DUCT ECTASIA - NO MALIGNANCY IDENTIFIED    08/05/2016 Surgery    LEFT ONCOPLASTIC REDUCTION; RIGHT BREAST REDUCTION by Dr. Iran Planas     09/11/2016 - 10/27/2016 Radiation Therapy    Radiation with Dr. Lisbeth Renshaw  Radiation treatment dates:   09/11/2016 to 10/27/2016  Site/dose:    1. The Left breast was treated to 50.4 Gy in 28 fractions at 1.8 Gy per fraction.  2. The Left Sclav was treated to 50.4 Gy in 28 fractions at 1.8 Gy per fraction. 3. The Left breast was boosted to 10 Gy in 5 fractions at 2 Gy per fraction.   Beams/energy:    1. 3D // 10X, 15X 2. photon // 15X, 6X  Narrative: The patient tolerated radiation treatment relatively well.  She developed mild erythema within the treatment field without any moist desquamation. Using Radiaplex as directed and using neosporin under inframammary fold where skin has peeled.     01/19/2017 Mammogram    IMPRESSION: No mammographic evidence of malignancy, status post left lumpectomy and bilateral reduction mammoplasty.      CURRENT THERAPY:  Surveillance  INTERVAL HISTORY:  Grace Moyer is here for a follow up of breast cancer. She was able to identify herself by face to face video. She notes she is doing well. She notes in the last 6 months she only had left arm tightness. She is concerned about developing lymphedema but she denies swelling at this time. She notes her neuropathy is stable. I reviewed her medication list with her.  She notes she received a notice that she owes the cancer center $140 for the Rituxan in 11/2017. At [facility] she does not have copay.  She is due to next infusion at end of May 2020. She plans to see Gyn yearly.    REVIEW OF SYSTEMS:   Constitutional: Denies fevers, chills or abnormal weight loss Eyes: Denies blurriness of vision Ears, nose, mouth, throat, and face: Denies mucositis or sore throat Respiratory: Denies cough, dyspnea or wheezes Cardiovascular: Denies palpitation, chest discomfort or lower extremity swelling Gastrointestinal:  Denies nausea, heartburn or change in bowel habits Skin: Denies abnormal skin rashes Lymphatics: Denies new lymphadenopathy or easy bruising (+) tightness of left arm, no swelling  Neurological:Denies numbness, tingling or new weaknesses Behavioral/Psych: Mood is stable, no new changes  All other systems were reviewed with the  patient and are negative.  MEDICAL HISTORY:  Past Medical History:  Diagnosis Date  . Anxiety   . Breast cancer ([place])   . Family history of breast cancer   . Family history of colon cancer   . Malignant neoplasm of upper-outer quadrant of left female breast ([place]) 01/31/2016  . Neuromyelitis optica ([place])   . Personal history of chemotherapy   . Personal history of radiation therapy     SURGICAL HISTORY: Past Surgical History:  Procedure Laterality Date  . ABDOMINAL HYSTERECTOMY     partial  . BREAST BIOPSY Left 2017  . BREAST LUMPECTOMY Left    2018  . BREAST LUMPECTOMY WITH RADIOACTIVE SEED AND SENTINEL LYMPH NODE BIOPSY Left 07/31/2016   Procedure: LEFT BREAST RADIOACTIVE SEED GUIDED LUMPECTOMY WITH LEFT RADIOACTIVE SEED TARGETED AXILLARY SENTINEL LYMPH NODE EXCISION AND SENTINEL LYMPH NODE BIOPSY;  Surgeon: Rolm Bookbinder, MD;  Location: [place];  Service: General;  Laterality: Left;  . BREAST REDUCTION SURGERY Bilateral 08/05/2016   Procedure: LEFT ONCOPLASTIC REDUCTION; RIGHT BREAST REDUCTION;  Surgeon: Irene Limbo, MD;  Location: [place];  Service: Plastics;  Laterality: Bilateral;  . EYE SURGERY    . PORT-A-CATH REMOVAL Right 07/31/2016   Procedure: REMOVAL PORT-A-CATH;  Surgeon: Rolm Bookbinder, MD;  Location: [place];  Service: General;  Laterality: Right;  . PORTACATH PLACEMENT Right 02/11/2016   Procedure: INSERTION PORT-A-CATH WITH [place];  Surgeon: Rolm Bookbinder, MD;  Location: [place];  Service: General;  Laterality: Right;  . REDUCTION MAMMAPLASTY      I have reviewed the social history and family history with the patient and they are unchanged from previous note.  ALLERGIES:  is allergic to hydrocodone.  MEDICATIONS:  Current Outpatient Medications  Medication Sig Dispense Refill  . aspirin 81 MG tablet Take 1 tablet (81 mg total) by mouth daily. 30 tablet   . Cholecalciferol  (VITAMIN D-1000 MAX ST) 1000 units tablet Take 2 tablets by mouth daily.    Marland Kitchen gabapentin (NEURONTIN) 100 MG capsule Take 1 capsule (100 mg total) by mouth 3 (three) times daily. 90 capsule 0   No current facility-administered medications for this visit.    Facility-Administered Medications Ordered in Other Visits  Medication Dose Route Frequency Provider Last Rate Last Dose  . heparin lock flush 100 unit/mL  500 Units Intravenous Once PRN Truitt Merle, MD      . sodium chloride flush (NS) 0.9 % injection 10 mL  10 mL Intracatheter PRN Truitt Merle, MD   10 mL at 06/18/16 1540  . sodium chloride flush (NS) 0.9 % injection 10 mL  10 mL Intravenous PRN Truitt Merle, MD      . sodium chloride flush (NS) 0.9 % injection 10 mL  10 mL Intravenous PRN Truitt Merle, MD  10 mL at 06/25/16 1106    PHYSICAL EXAMINATION: ECOG PERFORMANCE STATUS: 0 - Asymptomatic  No vitals taken today, Exam not performed today  LABORATORY DATA:  I have reviewed the data as listed CBC Latest Ref Rng & Units 03/26/2018 11/20/2017 08/26/2017  WBC 4.0 - 10.5 K/uL 4.1 4.9 4.8  Hemoglobin 12.0 - 15.0 g/dL 14.0 13.5 13.5  Hematocrit 36.0 - 46.0 % 41.1 40.7 39.4  Platelets 150 - 400 K/uL 160 151 168     CMP Latest Ref Rng & Units 03/26/2018 11/20/2017 08/26/2017  Glucose 70 - 99 mg/dL 97 81 77  BUN 6 - 20 mg/dL _0 Creatinine 0.44 - 1.00 mg/dL 0.84 0.99 0.94  Sodium 135 - 145 mmol/L 141 140 140  Potassium 3.5 - 5.1 mmol/L 3.9 4.1 3.9  Chloride 98 - 111 mmol/L 107 108 106  CO2 22 - 32 mmol/L _1 Calcium 8.9 - 10.3 mg/dL 9.3 9.1 9.5  Total Protein 6.5 - 8.1 g/dL 6.7 6.7 7.0  Total Bilirubin 0.3 - 1.2 mg/dL 0.6 0.4 0.6  Alkaline Phos 38 - 126 U/L 127(H) 118 140(H)  AST 15 - 41 U/L _2 ALT 0 - 44 U/L 35 21 32      RADIOGRAPHIC STUDIES: I have personally reviewed the radiological images as listed and agreed with the findings in the report. No results found.   ASSESSMENT & PLAN:  Grace Moyer is a 58  y.o. female with   1. Malignant neoplasm of upper-outer quadrant of left breast, invasive ductal carcinoma, G3, cT2N1M0, stage IIB, ER-/PR-/HER2-, ypT0N0 -She was diagnosed in 01/2016. She is s/p neo-adjuvant ddAC and CT, left breast lumpectomy and reconstruction, adjuvant radiation.  -She is clinically doing well without any concerns. Her mammogram from 01/2018 was negative for malignancy.   -Continue breast cancer surveillance, next mammogram and baseline DEXA in 01/2019 -F/u in 3-4 months with lab and exam    2. Left LE DVT and bilateral acute submassive PE, provoked by surgeries admitted 08/15/16 -She developed extensive left lower extremity DVT with bilateral acute submassive PE, requiring hospitalization -This is probably provoked by her breast surgery and immobility -She completed 6 months of Xarelto in 01/2017 and then proceeded with baby aspirin once daily.   3. Genetics -03/27/16 testing showed ATM c.2606C>T VUS identified on the Breast/Gyn panel. Otherwise Negative.   4. Neuromyelitis optica spectrum disorder (NOSD) -she will continue follow up with her neurologist Dr. Moshe Cipro at [facility]  -she is on rituximab every 4 weeks now through our clinic since 03/21/16. Tolerated generally well, next due in Jan 2020  -Based on her insurance her copay is $140 at our clinic and she has no copay at [facility]. She opted to continue her monthly Rituxan at [facility]. That is understandable.   4. Peripheral neuropathy, G1  -She previously had neuropathy from NOSD. Her tingling and numbness got slightly worse towards the end of chemotherapy, likely related to her chemotherapy -I previously started her on Gabapentin, off now due to side effects -Stable, improved since she recovered from chemo   Plan -Lab and f/u in 3-4 months    No problem-specific Assessment & Plan notes found for this encounter.   No orders of the defined types were placed in this encounter.  I discussed the assessment and  treatment plan with the patient. The patient was provided an opportunity to ask questions and all were answered. The patient agreed with the plan and demonstrated an understanding  of the instructions.  The patient was advised to call back or seek an in-person evaluation if the symptoms worsen or if the condition fails to improve as anticipated.   I provided 15 minutes of face-to-face video visit time during this encounter, and > 50% was spent counseling as documented under my assessment & plan.    Truitt Merle, MD 06/25/2018   I, Joslyn Devon, am acting as scribe for Truitt Merle, MD.   I have reviewed the above documentation for accuracy and completeness, and I agree with the above.

## 2018-06-23 NOTE — Telephone Encounter (Signed)
Spoke with patient and she downloaded the app onto her IPAD. Verified that she received her email and emailed her instructions.

## 2018-06-25 ENCOUNTER — Inpatient Hospital Stay: Payer: BLUE CROSS/BLUE SHIELD | Attending: Hematology | Admitting: Hematology

## 2018-06-25 ENCOUNTER — Telehealth: Payer: Self-pay | Admitting: Hematology

## 2018-06-25 ENCOUNTER — Encounter: Payer: Self-pay | Admitting: Hematology

## 2018-06-25 DIAGNOSIS — C50412 Malignant neoplasm of upper-outer quadrant of left female breast: Secondary | ICD-10-CM

## 2018-06-25 DIAGNOSIS — Z923 Personal history of irradiation: Secondary | ICD-10-CM

## 2018-06-25 DIAGNOSIS — G360 Neuromyelitis optica [Devic]: Secondary | ICD-10-CM

## 2018-06-25 DIAGNOSIS — Z171 Estrogen receptor negative status [ER-]: Secondary | ICD-10-CM

## 2018-06-25 NOTE — Telephone Encounter (Signed)
Scheduled appt per 4/24 los. ° °A calendar will be mailed out. °

## 2018-07-30 DIAGNOSIS — G360 Neuromyelitis optica [Devic]: Secondary | ICD-10-CM | POA: Diagnosis not present

## 2018-08-17 DIAGNOSIS — M654 Radial styloid tenosynovitis [de Quervain]: Secondary | ICD-10-CM | POA: Diagnosis not present

## 2018-08-30 DIAGNOSIS — G360 Neuromyelitis optica [Devic]: Secondary | ICD-10-CM | POA: Diagnosis not present

## 2018-09-27 NOTE — Progress Notes (Signed)
[place]   [phone] Fax:([phone]) [phone]   Clinic Follow up Note   Patient Care Team: Maisie Fus, MD as PCP - General (Obstetrics and Gynecology) Collene Gobble, MD as Attending Physician (Psychiatry) Rolm Bookbinder, MD as Consulting Physician (General Surgery) Truitt Merle, MD as Consulting Physician (Hematology) Kyung Rudd, MD as Consulting Physician (Radiation Oncology) Delice Bison Charlestine Massed, NP as Nurse Practitioner (Hematology and Oncology)  Date of Service:  10/01/2018  CHIEF COMPLAINT: Follow up left breast triple negative cancer  SUMMARY OF ONCOLOGIC HISTORY: Oncology History Overview Note  Cancer Staging Malignant neoplasm of upper-outer quadrant of left female breast [facility]) Staging form: Breast, AJCC 7th Edition - Clinical stage from 01/29/2016: Stage IIB (T2, N1, M0) - Signed by Truitt Merle, MD on 02/06/2016 - Pathologic stage from 07/31/2016: T0, N0, cM0 - Signed by Truitt Merle, MD on 12/31/2016     Malignant neoplasm of upper-outer quadrant of left female breast ([place])  01/28/2016 Mammogram   Diagnostic MM and US showed a 2.9cm (3.2X2.2X2.5cm on [place]) mass in [place], multiple enlarged left axillary nodes, largest 2.5cm.    01/29/2016 Initial Diagnosis   Malignant neoplasm of upper-outer quadrant of left female breast ([place])   01/29/2016 Initial Biopsy   Left breast mass and axillary node biopsy showed IDC, G3,    01/29/2016 Receptors her2   ER-, PR-, HER2-, Ki67 85%   02/11/2016 Imaging   Bilateral breast MRI with and without contrast showed a 3.3 x 2.6 x 2.6 cm mass in the upper outer left breast, and left axillary lymphadenopathy, at least 3 abnormal lymph nodes, no other additional sites of concern.   02/12/2016 Imaging   CT chest, abdomen and pelvis with contrast showed a 2.4 x 2.7 cm lesion in the upper outer left breast, left axillary node metastasis measuring up to 1.3 cm, no evidence of distant metastasis.   02/12/2016  Imaging   Bone scan was negative for skeletal metastasis.   02/13/2016 - 07/03/2016 Neo-Adjuvant Chemotherapy   Dose dense Adriamycin 60 mg/m, Cytoxan 600 mg/m, every 2 weeks, for 4 cycles, followed by weekly carboplatin and Taxol for 12 weeks    03/27/2016 Genetic Testing   Patient has genetic testing done for personal history of breast cancer, family history of cancer. ATM c.[id number]>T VUS identified on the Breast/GYN panel.  Negative genetic testing for the MSH2 inversion analysis ([place] inversion). The Breast/GYN gene panel offered by [facility] includes sequencing and rearrangement analysis for the following 23 genes:  ATM, BARD1, BRCA1, BRCA2, BRIP1, CDH1, CHEK2, EPCAM, FANCC, MLH1, MSH2, MSH6, MUTYH, NBN, NF1, PALB2, PMS2, POLD1, PTEN, RAD51C, RAD51D, RECQL, and TP53.      07/11/2016 Imaging   Bilateral Breast MRI FINDINGS: Breast composition: c. Heterogeneous fibroglandular tissue.  Background parenchymal enhancement: Minimal.  Right breast: No mass or abnormal enhancement.  Left breast: No mass or abnormal enhancement. Biopsy clip is identified at the posterior left breast upper outer quadrant. The previously noted enhancing mass is not seen currently.  Lymph nodes: No abnormal appearing lymph nodes. Previously noted abnormal lymph nodes in the left axilla are currently normal size.  Ancillary findings:  None.  IMPRESSION: Known cancer.   07/31/2016 Pathology Results   Diagnosis 1. Breast, lumpectomy, Left - LOBULAR NEOPLASIA (ATYPICAL LOBULAR HYPERPLASIA). - FIBROCYSTIC CHANGES WITH ADENOSIS AND CALCIFICATIONS. - [place]. - SEE ONCOLOGY TABLE BELOW. 2. Breast, excision, Left additional Medial Margin - LOBULAR NEOPLASIA (ATYPICAL LOBULAR HYPERPLASIA). - FIBROCYSTIC CHANGES WITH ADENOSIS AND CALCIFICATIONS. - RADIAL  SCAR. - HEALING BIOPSY SITE. - SEE COMMENT. 3. Lymph node, sentinel, biopsy, Left - THERE IS NO EVIDENCE OF CARCINOMA IN 1 OF 1  LYMPH NODE (0/1) - CHANGES CONSISTENT WITH PRIOR PROCEDURE. 4. Lymph node, sentinel, biopsy, Left - THERE IS NO EVIDENCE OF CARCINOMA IN 1 OF 1 LYMPH NODE (0/1) 5. Lymph node, sentinel, biopsy, Left - THERE IS NO EVIDENCE OF CARCINOMA IN 1 OF 1 LYMPH NODE (0/1) 6. Lymph node, sentinel, biopsy, Left - THERE IS NO EVIDENCE OF CARCINOMA IN 1 OF 1 LYMPH NODE (0/1) 7. Lymph node, sentinel, biopsy, Left - THERE IS NO EVIDENCE OF CARCINOMA IN 1 OF 1 LYMPH NODE (0/1)   07/31/2016 Surgery   LEFT BREAST RADIOACTIVE SEED GUIDED LUMPECTOMY WITH LEFT RADIOACTIVE SEED TARGETED AXILLARY SENTINEL LYMPH NODE EXCISION AND SENTINEL LYMPH NODE BIOPSY and port removal done by Dr. Donne Hazel.     08/05/2016 Pathology Results   Surgical pathology  Diagnosis 1. Breast, Mammoplasty, Left - SCLEROSING ADENOSIS WITH CALCIFICATIONS - FIBROCYSTIC CHANGES - DUCT ECTASIA - NO MALIGNANCY IDENTIFIED 2. Breast, Mammoplasty, Right - SCLEROSING ADENOSIS WITH CALCIFICATIONS - FIBROCYSTIC CHANGES - DUCT ECTASIA - NO MALIGNANCY IDENTIFIED   08/05/2016 Surgery   LEFT ONCOPLASTIC REDUCTION; RIGHT BREAST REDUCTION by Dr. Iran Planas    09/11/2016 - 10/27/2016 Radiation Therapy   Radiation with Dr. Lisbeth Renshaw  Radiation treatment dates:   09/11/2016 to 10/27/2016  Site/dose:    1. The Left breast was treated to 50.4 Gy in 28 fractions at 1.8 Gy per fraction. 2. The Left Sclav was treated to 50.4 Gy in 28 fractions at 1.8 Gy per fraction. 3. The Left breast was boosted to 10 Gy in 5 fractions at 2 Gy per fraction.   Beams/energy:    1. 3D // 10X, 15X 2. photon // 15X, 6X  Narrative: The patient tolerated radiation treatment relatively well.  She developed mild erythema within the treatment field without any moist desquamation. Using Radiaplex as directed and using neosporin under inframammary fold where skin has peeled.    01/19/2017 Mammogram   IMPRESSION: No mammographic evidence of malignancy, status post left  lumpectomy and bilateral reduction mammoplasty.      CURRENT THERAPY:  Surveillance  INTERVAL HISTORY:  Grace Moyer is here for a follow up left breast cancer. She presents to the clinic alone. She notes she is doing well. She denies nay new changes. Her neuropathy is stable. She notes she continues to have [facility]uxan at [facility]. She denies any pain or concerns. She notes her eating and appetite is adequate. She denies any GI issues. She will see Dr. Nori Riis in 01/2018. She has not had her cholesterol, A1c and TSH will him. Her last colonoscopy was normal.    REVIEW OF SYSTEMS:   Constitutional: Denies fevers, chills or abnormal weight loss Eyes: Denies blurriness of vision Ears, nose, mouth, throat, and face: Denies mucositis or sore throat Respiratory: Denies cough, dyspnea or wheezes Cardiovascular: Denies palpitation, chest discomfort or lower extremity swelling Gastrointestinal:  Denies nausea, heartburn or change in bowel habits Skin: Denies abnormal skin rashes Lymphatics: Denies new lymphadenopathy or easy bruising Neurological:Denies numbness, tingling or new weaknesses Behavioral/Psych: Mood is stable, no new changes  All other systems were reviewed with the patient and are negative.  MEDICAL HISTORY:  Past Medical History:  Diagnosis Date  . Anxiety   . Breast cancer ([place])   . Family history of breast cancer   . Family history of colon cancer   . Malignant neoplasm of  upper-outer quadrant of left female breast ([place]) 01/31/2016  . Neuromyelitis optica ([place])   . Personal history of chemotherapy   . Personal history of radiation therapy     SURGICAL HISTORY: Past Surgical History:  Procedure Laterality Date  . ABDOMINAL HYSTERECTOMY     partial  . BREAST BIOPSY Left 2017  . BREAST LUMPECTOMY Left    2018  . BREAST LUMPECTOMY WITH RADIOACTIVE SEED AND SENTINEL LYMPH NODE BIOPSY Left 07/31/2016   Procedure: LEFT BREAST RADIOACTIVE SEED GUIDED LUMPECTOMY WITH LEFT  RADIOACTIVE SEED TARGETED AXILLARY SENTINEL LYMPH NODE EXCISION AND SENTINEL LYMPH NODE BIOPSY;  Surgeon: Rolm Bookbinder, MD;  Location: [place];  Service: General;  Laterality: Left;  . BREAST REDUCTION SURGERY Bilateral 08/05/2016   Procedure: LEFT ONCOPLASTIC REDUCTION; RIGHT BREAST REDUCTION;  Surgeon: Irene Limbo, MD;  Location: [place];  Service: Plastics;  Laterality: Bilateral;  . EYE SURGERY    . PORT-A-CATH REMOVAL Right 07/31/2016   Procedure: REMOVAL PORT-A-CATH;  Surgeon: Rolm Bookbinder, MD;  Location: [place];  Service: General;  Laterality: Right;  . PORTACATH PLACEMENT Right 02/11/2016   Procedure: INSERTION PORT-A-CATH WITH [place];  Surgeon: Rolm Bookbinder, MD;  Location: [place];  Service: General;  Laterality: Right;  . REDUCTION MAMMAPLASTY      I have reviewed the social history and family history with the patient and they are unchanged from previous note.  ALLERGIES:  is allergic to hydrocodone.  MEDICATIONS:  Current Outpatient Medications  Medication Sig Dispense Refill  . aspirin 81 MG tablet Take 1 tablet (81 mg total) by mouth daily. 30 tablet   . Cholecalciferol (VITAMIN D-1000 MAX ST) 1000 units tablet Take 2 tablets by mouth daily.    Marland Kitchen gabapentin (NEURONTIN) 100 MG capsule Take 1 capsule (100 mg total) by mouth 3 (three) times daily. (Patient taking differently: Take 300 mg by mouth daily. ) 90 capsule 0   No current facility-administered medications for this visit.    Facility-Administered Medications Ordered in Other Visits  Medication Dose Route Frequency Provider Last Rate Last Dose  . heparin lock flush 100 unit/mL  500 Units Intravenous Once PRN Truitt Merle, MD      . sodium chloride flush (NS) 0.9 % injection 10 mL  10 mL Intracatheter PRN Truitt Merle, MD   10 mL at 06/18/16 1540  . sodium chloride flush (NS) 0.9 % injection 10 mL  10 mL Intravenous PRN Truitt Merle, MD      .  sodium chloride flush (NS) 0.9 % injection 10 mL  10 mL Intravenous PRN Truitt Merle, MD   10 mL at 06/25/16 1106    PHYSICAL EXAMINATION: ECOG PERFORMANCE STATUS: 0 - Asymptomatic  Vitals:   10/01/18 1115  BP: 140/70  Pulse: 62  Resp: 18  Temp: 98.7 F (37.1 C)  SpO2: 100%   Filed Weights   10/01/18 1115  Weight: 213 lb 9.6 oz (96.9 kg)    GENERAL:alert, no distress and comfortable SKIN: skin color, texture, turgor are normal, no rashes or significant lesions EYES: normal, Conjunctiva are pink and non-injected, sclera clear  NECK: supple, thyroid normal size, non-tender, without nodularity LYMPH:  no palpable lymphadenopathy in the cervical, axillary  LUNGS: clear to auscultation and percussion with normal breathing effort HEART: regular rate & rhythm and no murmurs and no lower extremity edema ABDOMEN:abdomen soft, non-tender and normal bowel sounds Musculoskeletal:no cyanosis of digits and no clubbing  NEURO: alert & oriented x 3 with  fluent speech, no focal motor/sensory deficits BREAST: S/p left lumpectomy: Surgical incision healed well. No palpable mass, nodules or adenopathy bilaterally. Breast exam benign.   LABORATORY DATA:  I have reviewed the data as listed CBC Latest Ref Rng & Units 10/01/2018 03/26/2018 11/20/2017  WBC 4.0 - 10.5 K/uL 4.8 4.1 4.9  Hemoglobin 12.0 - 15.0 g/dL 14.5 14.0 13.5  Hematocrit 36.0 - 46.0 % 42.7 41.1 40.7  Platelets 150 - 400 K/uL 192 160 151     CMP Latest Ref Rng & Units 10/01/2018 03/26/2018 11/20/2017  Glucose 70 - 99 mg/dL 109(H) 97 81  BUN 6 - 20 mg/dL 12 9 17   Creatinine 0.44 - 1.00 mg/dL 0.89 0.84 0.99  Sodium 135 - 145 mmol/L 140 141 140  Potassium 3.5 - 5.1 mmol/L 4.1 3.9 4.1  Chloride 98 - 111 mmol/L 108 107 108  CO2 22 - 32 mmol/L 21(L) 25 24  Calcium 8.9 - 10.3 mg/dL 9.2 9.3 9.1  Total Protein 6.5 - 8.1 g/dL 6.7 6.7 6.7  Total Bilirubin 0.3 - 1.2 mg/dL 0.4 0.6 0.4  Alkaline Phos 38 - 126 U/L 105 127(H) 118  AST 15 - 41  U/L 19 23 16   ALT 0 - 44 U/L 26 35 21      RADIOGRAPHIC STUDIES: I have personally reviewed the radiological images as listed and agreed with the findings in the report. No results found.   ASSESSMENT & PLAN:  Grace Moyer is a 58 y.o. female with   1. Malignant neoplasm of upper-outer quadrant of left breast, invasive ductal carcinoma, G3, cT2N1M0, stage IIB, ER-/PR-/HER2-, ypT0N0 -She was diagnosed in 01/2016. She is s/p neo-adjuvant ddAC and CT, left breast lumpectomy and reconstruction, adjuvant radiation.  -She is clinically doing well. Lab reviewed, her CBC and CMP are within normal limits. Her physical exam and her 01/2018 mammogram were unremarkable. There is no clinical concern for recurrence.  -Continue breast cancer surveillance, next mammogram and baseline DEXA in 01/2019 -I encouraged her to continue to monitor her bone density with Dr. Nori Riis. She will continue Calcium and Vit D.  -I encouraged her to remain active with exercise, eat healthy diet and watch her weight. I encouraged her to have cholesterol panel, TSH and A1c checked with her PCP.  -F/u in 6 months then yearly. She can change next visit to virtual visit if needed due to the [place] pandemic.   2. Left LE DVT and bilateral acute submassive PE, provoked by surgeries admitted 08/15/16 -She developed extensive left lower extremity DVT with bilateral acute submassive PE, requiring hospitalization -This is probably provoked by her breast surgery and immobility -She completed 6 months of Xarelto in 01/2017 and then proceeded with baby aspirin once daily. Will continue.   3. Genetics -03/27/16 testing showed ATM [id number] VUS identified on the Breast/Gyn panel. Otherwise Negative.   4. Neuromyelitis optica spectrum disorder (NOSD) -she will continue follow up with her neurologist Dr. Moshe Cipro at [facility]  -she is on rituximab every 4 weeks now through our clinic since 03/21/16. Tolerated generally well, next due in Jan  2020 -Based on her insurance her copay is $140 at our clinic and she has no copay at [facility]. She now has her monthly Rituxan at [facility]. That is understandable.   4. Peripheral neuropathy, G1  -She previously had neuropathy from NOSD.Her tingling and numbness got slightly worse towards the end of chemotherapy, likely related to her chemotherapy -I previously started her on Gabapentin, off now due to side effects -  Stable and mild in fingers and toes., improved since she recovered from chemo   Plan -She is clinically doing well  -Mammogram in 01/2019 -Lab and F/u in 6 months    No problem-specific Assessment & Plan notes found for this encounter.   Orders Placed This Encounter  Procedures  . MM DIAG BREAST TOMO BILATERAL    Standing Status:   Future    Standing Expiration Date:   10/01/2019    Order Specific Question:   Reason for Exam (SYMPTOM  OR DIAGNOSIS REQUIRED)    Answer:   screening    Order Specific Question:   Is the patient pregnant?    Answer:   No    Order Specific Question:   Preferred imaging location?    Answer:   [facility] [address]   All questions were answered. The patient knows to call the clinic with any problems, questions or concerns. No barriers to learning was detected. I spent 15 minutes counseling the patient face to face. The total time spent in the appointment was 20 minutes and more than 50% was on counseling and review of test results     Truitt Merle, MD 10/01/2018   I, Joslyn Devon, am acting as scribe for Truitt Merle, MD.   I have reviewed the above documentation for accuracy and completeness, and I agree with the above.

## 2018-10-01 ENCOUNTER — Encounter: Payer: Self-pay | Admitting: Hematology

## 2018-10-01 ENCOUNTER — Inpatient Hospital Stay: Payer: BC Managed Care – PPO | Attending: Hematology

## 2018-10-01 ENCOUNTER — Inpatient Hospital Stay (HOSPITAL_BASED_OUTPATIENT_CLINIC_OR_DEPARTMENT_OTHER): Payer: BC Managed Care – PPO | Admitting: Hematology

## 2018-10-01 ENCOUNTER — Other Ambulatory Visit: Payer: Self-pay

## 2018-10-01 VITALS — BP 140/70 | HR 62 | Temp 98.7°F | Resp 18 | Ht 63.0 in | Wt 213.6 lb

## 2018-10-01 DIAGNOSIS — G620 Drug-induced polyneuropathy: Secondary | ICD-10-CM

## 2018-10-01 DIAGNOSIS — I82402 Acute embolism and thrombosis of unspecified deep veins of left lower extremity: Secondary | ICD-10-CM | POA: Insufficient documentation

## 2018-10-01 DIAGNOSIS — G360 Neuromyelitis optica [Devic]: Secondary | ICD-10-CM

## 2018-10-01 DIAGNOSIS — Z803 Family history of malignant neoplasm of breast: Secondary | ICD-10-CM | POA: Diagnosis not present

## 2018-10-01 DIAGNOSIS — G629 Polyneuropathy, unspecified: Secondary | ICD-10-CM | POA: Diagnosis not present

## 2018-10-01 DIAGNOSIS — C773 Secondary and unspecified malignant neoplasm of axilla and upper limb lymph nodes: Secondary | ICD-10-CM | POA: Insufficient documentation

## 2018-10-01 DIAGNOSIS — Z9221 Personal history of antineoplastic chemotherapy: Secondary | ICD-10-CM | POA: Diagnosis not present

## 2018-10-01 DIAGNOSIS — Z885 Allergy status to narcotic agent status: Secondary | ICD-10-CM | POA: Insufficient documentation

## 2018-10-01 DIAGNOSIS — Z800 Family history of malignant neoplasm of digestive organs: Secondary | ICD-10-CM

## 2018-10-01 DIAGNOSIS — Z171 Estrogen receptor negative status [ER-]: Secondary | ICD-10-CM | POA: Diagnosis not present

## 2018-10-01 DIAGNOSIS — Z923 Personal history of irradiation: Secondary | ICD-10-CM | POA: Insufficient documentation

## 2018-10-01 DIAGNOSIS — Z86718 Personal history of other venous thrombosis and embolism: Secondary | ICD-10-CM | POA: Insufficient documentation

## 2018-10-01 DIAGNOSIS — T451X5A Adverse effect of antineoplastic and immunosuppressive drugs, initial encounter: Secondary | ICD-10-CM

## 2018-10-01 DIAGNOSIS — N6310 Unspecified lump in the right breast, unspecified quadrant: Secondary | ICD-10-CM

## 2018-10-01 DIAGNOSIS — C50412 Malignant neoplasm of upper-outer quadrant of left female breast: Secondary | ICD-10-CM

## 2018-10-01 DIAGNOSIS — Z79899 Other long term (current) drug therapy: Secondary | ICD-10-CM

## 2018-10-01 LAB — COMPREHENSIVE METABOLIC PANEL
ALT: 26 U/L (ref 0–44)
AST: 19 U/L (ref 15–41)
Albumin: 4 g/dL (ref 3.5–5.0)
Alkaline Phosphatase: 105 U/L (ref 38–126)
Anion gap: 11 (ref 5–15)
BUN: 12 mg/dL (ref 6–20)
CO2: 21 mmol/L — ABNORMAL LOW (ref 22–32)
Calcium: 9.2 mg/dL (ref 8.9–10.3)
Chloride: 108 mmol/L (ref 98–111)
Creatinine, Ser: 0.89 mg/dL (ref 0.44–1.00)
GFR calc Af Amer: >60 mL/min (ref >60)
GFR calc non Af Amer: >60 mL/min (ref >60)
Glucose, Bld: 109 mg/dL — ABNORMAL HIGH (ref 70–99)
Potassium: 4.1 mmol/L (ref 3.5–5.1)
Sodium: 140 mmol/L (ref 135–145)
Total Bilirubin: 0.4 mg/dL (ref 0.3–1.2)
Total Protein: 6.7 g/dL (ref 6.5–8.1)

## 2018-10-01 LAB — CBC WITH DIFFERENTIAL/PLATELET
Abs Immature Granulocytes: 0 10*3/uL (ref 0.00–0.07)
Basophils Absolute: 0 10*3/uL (ref 0.0–0.1)
Basophils Relative: 1 %
Eosinophils Absolute: 0.1 10*3/uL (ref 0.0–0.5)
Eosinophils Relative: 2 %
HCT: 42.7 % (ref 36.0–46.0)
Hemoglobin: 14.5 g/dL (ref 12.0–15.0)
Immature Granulocytes: 0 %
Lymphocytes Relative: 18 %
Lymphs Abs: 0.9 10*3/uL (ref 0.7–4.0)
MCH: 29 pg (ref 26.0–34.0)
MCHC: 34 g/dL (ref 30.0–36.0)
MCV: 85.4 fL (ref 80.0–100.0)
Monocytes Absolute: 0.4 10*3/uL (ref 0.1–1.0)
Monocytes Relative: 7 %
Neutro Abs: 3.5 10*3/uL (ref 1.7–7.7)
Neutrophils Relative %: 72 %
Platelets: 192 10*3/uL (ref 150–400)
RBC: 5 MIL/uL (ref 3.87–5.11)
RDW: 12.7 % (ref 11.5–15.5)
WBC: 4.8 10*3/uL (ref 4.0–10.5)
nRBC: 0 % (ref 0.0–0.2)

## 2018-10-04 ENCOUNTER — Telehealth: Payer: Self-pay | Admitting: Hematology

## 2018-10-04 NOTE — Telephone Encounter (Signed)
Scheduled appt per 7/31 los.   Spoke with patient and she is aware of the appt date and time.

## 2018-10-13 ENCOUNTER — Telehealth: Payer: Self-pay | Admitting: Hematology

## 2018-10-13 NOTE — Telephone Encounter (Signed)
Returned patient's phone call regarding rescheduling an appointment in February, left a voicemail.

## 2018-10-19 ENCOUNTER — Telehealth: Payer: Self-pay | Admitting: Hematology

## 2018-10-19 NOTE — Telephone Encounter (Signed)
Returned patient's phone call regarding voicemail that was left, per patient's request 02/01 appointment has been rescheduled to 02/05.

## 2018-11-26 DIAGNOSIS — G360 Neuromyelitis optica [Devic]: Secondary | ICD-10-CM | POA: Diagnosis not present

## 2018-12-25 DIAGNOSIS — Z20828 Contact with and (suspected) exposure to other viral communicable diseases: Secondary | ICD-10-CM | POA: Diagnosis not present

## 2019-01-18 ENCOUNTER — Other Ambulatory Visit: Payer: Self-pay

## 2019-01-18 DIAGNOSIS — Z20822 Contact with and (suspected) exposure to covid-19: Secondary | ICD-10-CM

## 2019-01-20 LAB — NOVEL CORONAVIRUS, NAA: SARS-CoV-2, NAA: DETECTED — AB

## 2019-02-07 DIAGNOSIS — J22 Unspecified acute lower respiratory infection: Secondary | ICD-10-CM | POA: Diagnosis not present

## 2019-02-07 DIAGNOSIS — U071 COVID-19: Secondary | ICD-10-CM | POA: Diagnosis not present

## 2019-02-07 DIAGNOSIS — N342 Other urethritis: Secondary | ICD-10-CM | POA: Diagnosis not present

## 2019-02-09 ENCOUNTER — Encounter (HOSPITAL_COMMUNITY): Payer: Self-pay | Admitting: Emergency Medicine

## 2019-02-09 ENCOUNTER — Inpatient Hospital Stay (HOSPITAL_COMMUNITY)
Admission: EM | Admit: 2019-02-09 | Discharge: 2019-02-13 | DRG: 177 | Disposition: A | Discharge status: Home / Self Care | Payer: BC Managed Care – PPO | Attending: Internal Medicine | Admitting: Internal Medicine

## 2019-02-09 ENCOUNTER — Other Ambulatory Visit: Payer: Self-pay

## 2019-02-09 DIAGNOSIS — Z825 Family history of asthma and other chronic lower respiratory diseases: Secondary | ICD-10-CM

## 2019-02-09 DIAGNOSIS — Z801 Family history of malignant neoplasm of trachea, bronchus and lung: Secondary | ICD-10-CM

## 2019-02-09 DIAGNOSIS — K219 Gastro-esophageal reflux disease without esophagitis: Secondary | ICD-10-CM | POA: Diagnosis not present

## 2019-02-09 DIAGNOSIS — R7401 Elevation of levels of liver transaminase levels: Secondary | ICD-10-CM

## 2019-02-09 DIAGNOSIS — Z8616 Personal history of COVID-19: Secondary | ICD-10-CM | POA: Diagnosis present

## 2019-02-09 DIAGNOSIS — Z79899 Other long term (current) drug therapy: Secondary | ICD-10-CM | POA: Diagnosis not present

## 2019-02-09 DIAGNOSIS — J9601 Acute respiratory failure with hypoxia: Secondary | ICD-10-CM | POA: Diagnosis not present

## 2019-02-09 DIAGNOSIS — Z803 Family history of malignant neoplasm of breast: Secondary | ICD-10-CM

## 2019-02-09 DIAGNOSIS — Z9221 Personal history of antineoplastic chemotherapy: Secondary | ICD-10-CM | POA: Diagnosis not present

## 2019-02-09 DIAGNOSIS — R0602 Shortness of breath: Secondary | ICD-10-CM

## 2019-02-09 DIAGNOSIS — J40 Bronchitis, not specified as acute or chronic: Secondary | ICD-10-CM | POA: Diagnosis present

## 2019-02-09 DIAGNOSIS — Z87440 Personal history of urinary (tract) infections: Secondary | ICD-10-CM | POA: Diagnosis not present

## 2019-02-09 DIAGNOSIS — J1289 Other viral pneumonia: Secondary | ICD-10-CM | POA: Diagnosis not present

## 2019-02-09 DIAGNOSIS — Z8249 Family history of ischemic heart disease and other diseases of the circulatory system: Secondary | ICD-10-CM

## 2019-02-09 DIAGNOSIS — R300 Dysuria: Secondary | ICD-10-CM | POA: Diagnosis not present

## 2019-02-09 DIAGNOSIS — R7303 Prediabetes: Secondary | ICD-10-CM | POA: Diagnosis not present

## 2019-02-09 DIAGNOSIS — Z800 Family history of malignant neoplasm of digestive organs: Secondary | ICD-10-CM | POA: Diagnosis not present

## 2019-02-09 DIAGNOSIS — Z7982 Long term (current) use of aspirin: Secondary | ICD-10-CM

## 2019-02-09 DIAGNOSIS — U071 COVID-19: Secondary | ICD-10-CM

## 2019-02-09 DIAGNOSIS — Z86711 Personal history of pulmonary embolism: Secondary | ICD-10-CM | POA: Diagnosis not present

## 2019-02-09 DIAGNOSIS — G360 Neuromyelitis optica [Devic]: Secondary | ICD-10-CM | POA: Diagnosis present

## 2019-02-09 DIAGNOSIS — Z6836 Body mass index (BMI) 36.0-36.9, adult: Secondary | ICD-10-CM | POA: Diagnosis not present

## 2019-02-09 DIAGNOSIS — E872 Acidosis: Secondary | ICD-10-CM | POA: Diagnosis not present

## 2019-02-09 DIAGNOSIS — E6609 Other obesity due to excess calories: Secondary | ICD-10-CM | POA: Diagnosis not present

## 2019-02-09 DIAGNOSIS — R350 Frequency of micturition: Secondary | ICD-10-CM | POA: Diagnosis present

## 2019-02-09 DIAGNOSIS — N179 Acute kidney failure, unspecified: Secondary | ICD-10-CM | POA: Diagnosis not present

## 2019-02-09 DIAGNOSIS — K760 Fatty (change of) liver, not elsewhere classified: Secondary | ICD-10-CM | POA: Diagnosis present

## 2019-02-09 DIAGNOSIS — Z923 Personal history of irradiation: Secondary | ICD-10-CM

## 2019-02-09 DIAGNOSIS — R739 Hyperglycemia, unspecified: Secondary | ICD-10-CM | POA: Diagnosis not present

## 2019-02-09 DIAGNOSIS — Z87891 Personal history of nicotine dependence: Secondary | ICD-10-CM | POA: Diagnosis not present

## 2019-02-09 DIAGNOSIS — Z853 Personal history of malignant neoplasm of breast: Secondary | ICD-10-CM | POA: Diagnosis not present

## 2019-02-09 DIAGNOSIS — J1282 Pneumonia due to coronavirus disease 2019: Secondary | ICD-10-CM

## 2019-02-09 NOTE — ED Triage Notes (Signed)
Pt with increased SOB x 1 week after testing + for Covid at the end of November.

## 2019-02-10 ENCOUNTER — Emergency Department (HOSPITAL_COMMUNITY): Payer: BC Managed Care – PPO

## 2019-02-10 ENCOUNTER — Inpatient Hospital Stay (HOSPITAL_COMMUNITY): Payer: BC Managed Care – PPO

## 2019-02-10 DIAGNOSIS — R7303 Prediabetes: Secondary | ICD-10-CM | POA: Diagnosis present

## 2019-02-10 DIAGNOSIS — Z87891 Personal history of nicotine dependence: Secondary | ICD-10-CM | POA: Diagnosis not present

## 2019-02-10 DIAGNOSIS — Z79899 Other long term (current) drug therapy: Secondary | ICD-10-CM | POA: Diagnosis not present

## 2019-02-10 DIAGNOSIS — N179 Acute kidney failure, unspecified: Secondary | ICD-10-CM | POA: Diagnosis present

## 2019-02-10 DIAGNOSIS — E6609 Other obesity due to excess calories: Secondary | ICD-10-CM | POA: Diagnosis present

## 2019-02-10 DIAGNOSIS — J9601 Acute respiratory failure with hypoxia: Secondary | ICD-10-CM | POA: Diagnosis present

## 2019-02-10 DIAGNOSIS — U071 COVID-19: Principal | ICD-10-CM

## 2019-02-10 DIAGNOSIS — Z803 Family history of malignant neoplasm of breast: Secondary | ICD-10-CM | POA: Diagnosis not present

## 2019-02-10 DIAGNOSIS — Z8249 Family history of ischemic heart disease and other diseases of the circulatory system: Secondary | ICD-10-CM | POA: Diagnosis not present

## 2019-02-10 DIAGNOSIS — J1289 Other viral pneumonia: Secondary | ICD-10-CM

## 2019-02-10 DIAGNOSIS — Z7982 Long term (current) use of aspirin: Secondary | ICD-10-CM | POA: Diagnosis not present

## 2019-02-10 DIAGNOSIS — K219 Gastro-esophageal reflux disease without esophagitis: Secondary | ICD-10-CM | POA: Diagnosis not present

## 2019-02-10 DIAGNOSIS — K760 Fatty (change of) liver, not elsewhere classified: Secondary | ICD-10-CM | POA: Diagnosis present

## 2019-02-10 DIAGNOSIS — Z800 Family history of malignant neoplasm of digestive organs: Secondary | ICD-10-CM | POA: Diagnosis not present

## 2019-02-10 DIAGNOSIS — R7401 Elevation of levels of liver transaminase levels: Secondary | ICD-10-CM | POA: Diagnosis not present

## 2019-02-10 DIAGNOSIS — Z6836 Body mass index (BMI) 36.0-36.9, adult: Secondary | ICD-10-CM | POA: Diagnosis not present

## 2019-02-10 DIAGNOSIS — Z8616 Personal history of COVID-19: Secondary | ICD-10-CM | POA: Diagnosis present

## 2019-02-10 DIAGNOSIS — R350 Frequency of micturition: Secondary | ICD-10-CM | POA: Diagnosis present

## 2019-02-10 DIAGNOSIS — Z87440 Personal history of urinary (tract) infections: Secondary | ICD-10-CM | POA: Diagnosis not present

## 2019-02-10 DIAGNOSIS — Z853 Personal history of malignant neoplasm of breast: Secondary | ICD-10-CM | POA: Diagnosis not present

## 2019-02-10 DIAGNOSIS — Z9221 Personal history of antineoplastic chemotherapy: Secondary | ICD-10-CM | POA: Diagnosis not present

## 2019-02-10 DIAGNOSIS — J40 Bronchitis, not specified as acute or chronic: Secondary | ICD-10-CM | POA: Diagnosis present

## 2019-02-10 DIAGNOSIS — E872 Acidosis: Secondary | ICD-10-CM | POA: Diagnosis present

## 2019-02-10 DIAGNOSIS — Z923 Personal history of irradiation: Secondary | ICD-10-CM | POA: Diagnosis not present

## 2019-02-10 DIAGNOSIS — R300 Dysuria: Secondary | ICD-10-CM | POA: Diagnosis present

## 2019-02-10 DIAGNOSIS — R739 Hyperglycemia, unspecified: Secondary | ICD-10-CM | POA: Diagnosis not present

## 2019-02-10 DIAGNOSIS — G360 Neuromyelitis optica [Devic]: Secondary | ICD-10-CM | POA: Diagnosis present

## 2019-02-10 DIAGNOSIS — R0602 Shortness of breath: Secondary | ICD-10-CM | POA: Diagnosis not present

## 2019-02-10 DIAGNOSIS — Z86711 Personal history of pulmonary embolism: Secondary | ICD-10-CM | POA: Diagnosis not present

## 2019-02-10 LAB — CBC
HCT: 40.6 % (ref 36.0–46.0)
Hemoglobin: 13.1 g/dL (ref 12.0–15.0)
MCH: 29 pg (ref 26.0–34.0)
MCHC: 32.3 g/dL (ref 30.0–36.0)
MCV: 89.8 fL (ref 80.0–100.0)
Platelets: 199 10*3/uL (ref 150–400)
RBC: 4.52 MIL/uL (ref 3.87–5.11)
RDW: 12.8 % (ref 11.5–15.5)
WBC: 3.3 10*3/uL — ABNORMAL LOW (ref 4.0–10.5)
nRBC: 0 % (ref 0.0–0.2)

## 2019-02-10 LAB — CBC WITH DIFFERENTIAL/PLATELET
Abs Immature Granulocytes: 0.02 10*3/uL (ref 0.00–0.07)
Basophils Absolute: 0 10*3/uL (ref 0.0–0.1)
Basophils Relative: 0 %
Eosinophils Absolute: 0.1 10*3/uL (ref 0.0–0.5)
Eosinophils Relative: 2 %
HCT: 42.5 % (ref 36.0–46.0)
Hemoglobin: 13.9 g/dL (ref 12.0–15.0)
Immature Granulocytes: 1 %
Lymphocytes Relative: 15 %
Lymphs Abs: 0.4 10*3/uL — ABNORMAL LOW (ref 0.7–4.0)
MCH: 28.9 pg (ref 26.0–34.0)
MCHC: 32.7 g/dL (ref 30.0–36.0)
MCV: 88.4 fL (ref 80.0–100.0)
Monocytes Absolute: 0.2 10*3/uL (ref 0.1–1.0)
Monocytes Relative: 7 %
Neutro Abs: 2.2 10*3/uL (ref 1.7–7.7)
Neutrophils Relative %: 75 %
Platelets: 220 10*3/uL (ref 150–400)
RBC: 4.81 MIL/uL (ref 3.87–5.11)
RDW: 12.6 % (ref 11.5–15.5)
WBC: 3 10*3/uL — ABNORMAL LOW (ref 4.0–10.5)
nRBC: 0 % (ref 0.0–0.2)

## 2019-02-10 LAB — COMPREHENSIVE METABOLIC PANEL
ALT: 58 U/L — ABNORMAL HIGH (ref 0–44)
AST: 57 U/L — ABNORMAL HIGH (ref 15–41)
Albumin: 3.7 g/dL (ref 3.5–5.0)
Alkaline Phosphatase: 333 U/L — ABNORMAL HIGH (ref 38–126)
Anion gap: 13 (ref 5–15)
BUN: 17 mg/dL (ref 6–20)
CO2: 24 mmol/L (ref 22–32)
Calcium: 8.6 mg/dL — ABNORMAL LOW (ref 8.9–10.3)
Chloride: 100 mmol/L (ref 98–111)
Creatinine, Ser: 1.24 mg/dL — ABNORMAL HIGH (ref 0.44–1.00)
GFR calc Af Amer: 56 mL/min — ABNORMAL LOW (ref >60)
GFR calc non Af Amer: 48 mL/min — ABNORMAL LOW (ref >60)
Glucose, Bld: 105 mg/dL — ABNORMAL HIGH (ref 70–99)
Potassium: 3.6 mmol/L (ref 3.5–5.1)
Sodium: 137 mmol/L (ref 135–145)
Total Bilirubin: 0.4 mg/dL (ref 0.3–1.2)
Total Protein: 7.3 g/dL (ref 6.5–8.1)

## 2019-02-10 LAB — LACTATE DEHYDROGENASE: LDH: 207 U/L — ABNORMAL HIGH (ref 98–192)

## 2019-02-10 LAB — D-DIMER, QUANTITATIVE: D-Dimer, Quant: 0.62 ug/mL-FEU — ABNORMAL HIGH (ref 0.00–0.50)

## 2019-02-10 LAB — CREATININE, SERUM
Creatinine, Ser: 1 mg/dL (ref 0.44–1.00)
GFR calc Af Amer: >60 mL/min (ref >60)
GFR calc non Af Amer: >60 mL/min (ref >60)

## 2019-02-10 LAB — ABO/RH: ABO/RH(D): A POS

## 2019-02-10 LAB — C-REACTIVE PROTEIN: CRP: 10.6 mg/dL — ABNORMAL HIGH (ref <1.0)

## 2019-02-10 LAB — PROCALCITONIN: Procalcitonin: 0.24 ng/mL

## 2019-02-10 LAB — HIV ANTIBODY (ROUTINE TESTING W REFLEX): HIV Screen 4th Generation wRfx: NONREACTIVE

## 2019-02-10 LAB — SARS CORONAVIRUS 2 BY RT PCR (HOSPITAL ORDER, PERFORMED IN ~~LOC~~ HOSPITAL LAB): SARS Coronavirus 2: POSITIVE — AB

## 2019-02-10 LAB — PROTIME-INR
INR: 1.1 (ref 0.8–1.2)
Prothrombin Time: 13.6 seconds (ref 11.4–15.2)

## 2019-02-10 LAB — APTT: aPTT: 31 seconds (ref 24–36)

## 2019-02-10 LAB — LACTIC ACID, PLASMA: Lactic Acid, Venous: 1.5 mmol/L (ref 0.5–1.9)

## 2019-02-10 LAB — FERRITIN: Ferritin: 550 ng/mL — ABNORMAL HIGH (ref 11–307)

## 2019-02-10 MED ORDER — SODIUM CHLORIDE 0.9 % IV SOLN: 1.0000 g | Freq: Once | INTRAVENOUS | Status: AC

## 2019-02-10 MED ORDER — DEXAMETHASONE SODIUM PHOSPHATE 10 MG/ML IJ SOLN
6.0000 mg | INTRAMUSCULAR | Status: DC
  Administered 2019-02-10 – 2019-02-13 (×4): 6 mg via INTRAVENOUS
  Filled 2019-02-10 (×4): qty 1

## 2019-02-10 MED ORDER — ZINC SULFATE 220 (50 ZN) MG PO CAPS
220.0000 mg | ORAL_CAPSULE | Freq: Every day | ORAL | Status: DC
  Administered 2019-02-10 – 2019-02-13 (×4): 220 mg via ORAL
  Filled 2019-02-10 (×4): qty 1

## 2019-02-10 MED ORDER — SODIUM CHLORIDE 0.9 % IV SOLN
500.0000 mg | INTRAVENOUS | Status: DC
  Administered 2019-02-11 – 2019-02-12 (×2): 500 mg via INTRAVENOUS
  Filled 2019-02-10 (×2): qty 500

## 2019-02-10 MED ORDER — SODIUM CHLORIDE 0.9 % IV SOLN
INTRAVENOUS | Status: AC
  Administered 2019-02-10: 03:00:00 via INTRAVENOUS
  Filled 2019-02-10: qty 10

## 2019-02-10 MED ORDER — SODIUM CHLORIDE 0.9 % IV SOLN
INTRAVENOUS | Status: AC
  Administered 2019-02-10: 500 mg via INTRAVENOUS
  Filled 2019-02-10: qty 500

## 2019-02-10 MED ORDER — SODIUM CHLORIDE 0.9% FLUSH
3.0000 mL | Freq: Two times a day (BID) | INTRAVENOUS | Status: DC
  Administered 2019-02-11 – 2019-02-13 (×4): 3 mL via INTRAVENOUS

## 2019-02-10 MED ORDER — VITAMIN C 500 MG PO TABS
500.0000 mg | ORAL_TABLET | Freq: Every day | ORAL | Status: DC
  Administered 2019-02-10 – 2019-02-13 (×4): 500 mg via ORAL
  Filled 2019-02-10 (×4): qty 1

## 2019-02-10 MED ORDER — SODIUM CHLORIDE 0.9 % IV SOLN
200.0000 mg | Freq: Once | INTRAVENOUS | Status: AC
  Administered 2019-02-10: 200 mg via INTRAVENOUS
  Filled 2019-02-10: qty 40

## 2019-02-10 MED ORDER — GABAPENTIN 300 MG PO CAPS
300.0000 mg | ORAL_CAPSULE | Freq: Every day | ORAL | Status: DC
  Administered 2019-02-10 – 2019-02-13 (×4): 300 mg via ORAL
  Filled 2019-02-10: qty 3
  Filled 2019-02-10 (×3): qty 1

## 2019-02-10 MED ORDER — ONDANSETRON HCL 4 MG/2ML IJ SOLN: 4.0000 mg | Freq: Four times a day (QID) | INTRAMUSCULAR | Status: DC | PRN

## 2019-02-10 MED ORDER — DEXAMETHASONE SODIUM PHOSPHATE 4 MG/ML IJ SOLN
4.0000 mg | Freq: Once | INTRAMUSCULAR | Status: AC
  Administered 2019-02-10: 4 mg via INTRAVENOUS
  Filled 2019-02-10: qty 1

## 2019-02-10 MED ORDER — DEXAMETHASONE SODIUM PHOSPHATE 10 MG/ML IJ SOLN: 10.0000 mg | Freq: Once | INTRAMUSCULAR | Status: DC

## 2019-02-10 MED ORDER — SODIUM CHLORIDE 0.9% FLUSH: 3.0000 mL | INTRAVENOUS | Status: DC | PRN

## 2019-02-10 MED ORDER — SODIUM CHLORIDE 0.9 % IV SOLN: 200.0000 mg | Freq: Once | INTRAVENOUS | Status: DC

## 2019-02-10 MED ORDER — SODIUM CHLORIDE 0.9 % IV SOLN
100.0000 mg | Freq: Every day | INTRAVENOUS | Status: DC
  Administered 2019-02-11 – 2019-02-13 (×3): 100 mg via INTRAVENOUS
  Filled 2019-02-10 (×2): qty 100
  Filled 2019-02-10: qty 20
  Filled 2019-02-10: qty 100

## 2019-02-10 MED ORDER — SODIUM CHLORIDE 0.9 % IV SOLN
INTRAVENOUS | Status: AC
  Administered 2019-02-10 (×2): via INTRAVENOUS

## 2019-02-10 MED ORDER — ASPIRIN 81 MG PO CHEW
81.0000 mg | CHEWABLE_TABLET | Freq: Every day | ORAL | Status: DC
  Administered 2019-02-11 – 2019-02-13 (×3): 81 mg via ORAL
  Filled 2019-02-10 (×3): qty 1

## 2019-02-10 MED ORDER — ACETAMINOPHEN 500 MG PO TABS
1000.0000 mg | ORAL_TABLET | Freq: Once | ORAL | Status: AC
  Administered 2019-02-10: 1000 mg via ORAL

## 2019-02-10 MED ORDER — ACETAMINOPHEN 500 MG PO TABS
ORAL_TABLET | ORAL | Status: AC
  Filled 2019-02-10: qty 2

## 2019-02-10 MED ORDER — ENOXAPARIN SODIUM 40 MG/0.4ML ~~LOC~~ SOLN
40.0000 mg | SUBCUTANEOUS | Status: DC
  Administered 2019-02-10 – 2019-02-13 (×4): 40 mg via SUBCUTANEOUS
  Filled 2019-02-10 (×4): qty 0.4

## 2019-02-10 MED ORDER — SODIUM CHLORIDE 0.9 % IV SOLN: 250.0000 mL | INTRAVENOUS | Status: DC | PRN

## 2019-02-10 MED ORDER — IOHEXOL 350 MG/ML SOLN
100.0000 mL | Freq: Once | INTRAVENOUS | Status: AC | PRN
  Administered 2019-02-10: 100 mL via INTRAVENOUS

## 2019-02-10 MED ORDER — VITAMIN D 25 MCG (1000 UNIT) PO TABS
2000.0000 [IU] | ORAL_TABLET | Freq: Every day | ORAL | Status: DC
  Administered 2019-02-11 – 2019-02-13 (×3): 2000 [IU] via ORAL
  Filled 2019-02-10 (×3): qty 2

## 2019-02-10 MED ORDER — ONDANSETRON HCL 4 MG PO TABS: 4.0000 mg | ORAL_TABLET | Freq: Four times a day (QID) | ORAL | Status: DC | PRN

## 2019-02-10 MED ORDER — SODIUM CHLORIDE 0.9 % IV SOLN: 100.0000 mg | Freq: Every day | INTRAVENOUS | Status: DC

## 2019-02-10 NOTE — ED Provider Notes (Signed)
[facility] EMERGENCY DEPARTMENT Provider Note   CSN: [id number] Arrival date & time: 02/09/19  2313     History   Chief Complaint Chief Complaint  Patient presents with   Shortness of Breath    HPI Grace Moyer is a 58 y.o. female.     Patient presents to the emergency department for evaluation of increased shortness of breath.  Patient reports that she tested positive for Covid in the middle of November.  She did a 14-day quarantine and then went back to work approximately a week and a half ago.  She has had some progressive shortness of breath over the last couple of days.  Patient also reports that 2 days ago she started having urinary frequency and dysuria, has a history of frequent UTIs.  Her doctor put her on Bactrim to cover for the UTI as well as increasing cough and chest congestion.  Tonight her breathing significantly worsened.  Patient not experiencing any pain.  She does, however, report that she has a history of bilateral PE.  This occurred in the setting of 2 surgeries for breast cancer.  She is not currently on anticoagulation.     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[place])    Personal history of chemotherapy    Personal history of radiation therapy     Patient Active Problem List   Diagnosis Date Noted   Personal history of venous thrombosis and embolism 12/31/2016   Peripheral neuropathy due to chemotherapy ([place]) 12/31/2016   Acute pulmonary embolus ([place]) 08/15/2016   Anemia due to antineoplastic chemotherapy 04/23/2016   Genetic testing 04/03/2016   Port catheter in place 03/26/2016   Family history of breast cancer    Family history of colon cancer    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    Malignant neoplasm of upper-outer quadrant of left female breast ([place]) 01/31/2016   Bilateral leg numbness 08/14/2015   Neck pain on left side 08/14/2015   Abnormal MRI of head 08/14/2015    Past Surgical History:  Procedure Laterality Date   ABDOMINAL HYSTERECTOMY     partial   BREAST BIOPSY Left 2017   BREAST LUMPECTOMY Left    2018   BREAST LUMPECTOMY WITH RADIOACTIVE SEED AND SENTINEL LYMPH NODE BIOPSY Left 07/31/2016   Procedure: LEFT BREAST RADIOACTIVE SEED GUIDED LUMPECTOMY WITH LEFT RADIOACTIVE SEED TARGETED AXILLARY SENTINEL LYMPH NODE EXCISION AND SENTINEL LYMPH NODE BIOPSY;  Surgeon: Rolm Bookbinder, MD;  Location: [facility];  Service: General;  Laterality: Left;   BREAST REDUCTION SURGERY Bilateral 08/05/2016   Procedure: LEFT ONCOPLASTIC REDUCTION; RIGHT BREAST REDUCTION;  Surgeon: Irene Limbo, MD;  Location: [facility];  Service: Plastics;  Laterality: Bilateral;   EYE SURGERY     PORT-A-CATH REMOVAL Right 07/31/2016   Procedure: REMOVAL PORT-A-CATH;  Surgeon: Rolm Bookbinder, MD;  Location: [facility];  Service: General;  Laterality: Right;   PORTACATH PLACEMENT Right 02/11/2016   Procedure: INSERTION PORT-A-CATH WITH [place];  Surgeon: Rolm Bookbinder, MD;  Location: [place];  Service: General;  Laterality: Right;   REDUCTION MAMMAPLASTY       OB History   No obstetric history on file.      Home Medications  Prior to Admission medications   Medication Sig Start Date End Date Taking? Authorizing Provider  aspirin 81 MG tablet Take 1 tablet (81 mg total) by mouth daily. 03/30/17   Magrinat, Virgie Dad, MD  Cholecalciferol (VITAMIN D-1000 MAX ST) 1000 units tablet Take 2 tablets by mouth daily.    [provider]  gabapentin (NEURONTIN) 100 MG capsule Take 1 capsule (100 mg total) by mouth 3 (three) times daily. Patient taking differently: Take 300 mg by mouth daily.  12/23/17    Truitt Merle, MD    Family History Family History  Problem Relation Age of Onset   Breast cancer Maternal Grandmother        dx in her 80s   CAD Paternal Grandmother    Colon cancer Paternal Uncle        dx in his 63s   COPD Maternal Grandfather    CAD Paternal Grandfather    Breast cancer Cousin        dx in her early to mid 21s; maternal first cousin   Lung cancer Paternal Uncle     Social History Social History   Tobacco Use   Smoking status: Former Smoker    Types: Cigarettes    Quit date: 09/03/2001    Years since quitting: 17.4   Smokeless tobacco: Never Used  Substance Use Topics   Alcohol use: Yes    Comment: social   Drug use: No     Allergies   Hydrocodone   Review of Systems Review of Systems  Respiratory: Positive for cough and shortness of breath.   Genitourinary: Positive for dysuria and frequency.  All other systems reviewed and are negative.    Physical Exam Updated Vital Signs BP (!) 108/57    Pulse 93    Temp 98.6 F (37 C) (Oral)    Resp (!) 29    Ht 5\' 3"  (1.6 m)    Wt 95.3 kg    SpO2 97%    BMI 37.20 kg/m   Physical Exam Vitals signs and nursing note reviewed.  Constitutional:      General: She is in acute distress.     Appearance: Normal appearance. She is well-developed.  HENT:     Head: Normocephalic and atraumatic.     Right Ear: Hearing normal.     Left Ear: Hearing normal.     Nose: Nose normal.  Eyes:     Conjunctiva/sclera: Conjunctivae normal.     Pupils: Pupils are equal, round, and reactive to light.  Neck:     Musculoskeletal: Normal range of motion and neck supple.  Cardiovascular:     Rate and Rhythm: Regular rhythm. Tachycardia present.     Heart sounds: S1 normal and S2 normal. No murmur. No friction rub. No gallop.   Pulmonary:     Effort: Tachypnea and accessory muscle usage present.     Breath sounds: Examination of the right-lower field reveals rales. Examination of the left-lower field reveals  rales. Decreased breath sounds and rales present.  Chest:     Chest wall: No tenderness.  Abdominal:     General: Bowel sounds are normal.     Palpations: Abdomen is soft.     Tenderness: There is no abdominal tenderness. There is no guarding or rebound. Negative signs include Murphy's sign and McBurney's sign.     Hernia: No hernia is present.  Musculoskeletal: Normal range of motion.  Skin:    General: Skin is warm and dry.  Findings: No rash.  Neurological:     Mental Status: She is alert and oriented to person, place, and time.     GCS: GCS eye subscore is 4. GCS verbal subscore is 5. GCS motor subscore is 6.     Cranial Nerves: No cranial nerve deficit.     Sensory: No sensory deficit.     Coordination: Coordination normal.  Psychiatric:        Speech: Speech normal.        Behavior: Behavior normal.        Thought Content: Thought content normal.      ED Treatments / Results  Labs (all labs ordered are listed, but only abnormal results are displayed) Labs Reviewed  SARS CORONAVIRUS 2 ([place]) - Abnormal; Notable for the following components:      Result Value   SARS Coronavirus 2 POSITIVE (*)    All other components within normal limits  CBC WITH DIFFERENTIAL/PLATELET - Abnormal; Notable for the following components:   WBC 3.0 (*)    Lymphs Abs 0.4 (*)    All other components within normal limits  COMPREHENSIVE METABOLIC PANEL - Abnormal; Notable for the following components:   Glucose, Bld 105 (*)    Creatinine, Ser 1.24 (*)    Calcium 8.6 (*)    AST 57 (*)    ALT 58 (*)    Alkaline Phosphatase 333 (*)    GFR calc non Af Amer 48 (*)    GFR calc Af Amer 56 (*)    All other components within normal limits  URINE CULTURE  CULTURE, BLOOD (ROUTINE X 2)  CULTURE, BLOOD (ROUTINE X 2)  LACTIC ACID, PLASMA  PROTIME-INR  APTT  URINALYSIS, ROUTINE W REFLEX MICROSCOPIC    EKG EKG  Interpretation  Date/Time:  Thursday February 10 2019 00:04:55 EST Ventricular Rate:  86 PR Interval:    QRS Duration: 79 QT Interval:  462 QTC Calculation: 553 R Axis:   56 Text Interpretation: Sinus rhythm Low voltage, precordial leads Nonspecific T abnormalities, anterior leads Prolonged QT interval Baseline wander in lead(s) I III aVR aVL V2 V3 V4 V5 V6 Confirmed by Orpah Greek [phone]) on 02/10/2019 12:28:11 AM   Radiology CT ANGIO CHEST PE W OR WO CONTRAST  Result Date: 02/10/2019 CLINICAL DATA:  Shortness of breath. Recent history of COVID-19. EXAM: CT ANGIOGRAPHY CHEST WITH CONTRAST TECHNIQUE: Multidetector CT imaging of the chest was performed using the standard protocol during bolus administration of intravenous contrast. Multiplanar CT image reconstructions and MIPs were obtained to evaluate the vascular anatomy. CONTRAST:  185mL OMNIPAQUE IOHEXOL 350 MG/ML SOLN COMPARISON:  Chest radiograph earlier this day. Chest CTA 08/15/2016. FINDINGS: Cardiovascular: There are no filling defects within the pulmonary arteries to suggest pulmonary embolus. Evaluation is diagnostic to the segmental level. The AV is question bilateral lower lobe segmental pulmonary emboli are no longer seen. No aortic dissection. Heart is normal in size. No pericardial effusion. Mediastinum/Nodes: No enlarged mediastinal or hilar lymph nodes. No axillary adenopathy. No suspicious thyroid nodule. Slightly patulous esophagus without wall thickening. Tiny hiatal hernia. Lungs/Pleura: Bilateral patchy ground-glass opacities throughout both lungs. There scattered air bronchograms and slightly consolidative opacities, particularly in the lower lobes. Pleural effusion. No pulmonary edema. The trachea and mainstem bronchi are patent. Upper Abdomen: No acute abnormality. Musculoskeletal: There are no acute or suspicious osseous abnormalities. Left axillary surgical clips. Review of the MIP images confirms the above  findings. IMPRESSION: 1. No pulmonary  embolus. 2. Bilateral patchy ground-glass and consolidative opacities throughout both lungs, pattern consistent with COVID-19 pneumonia. Electronically Signed   By: Keith Rake M.D.   On: 02/10/2019 03:50   DG Chest Port 1 View  Result Date: 02/10/2019 CLINICAL DATA:  Shortness of breath.  COVID-19 positive. EXAM: PORTABLE CHEST 1 VIEW COMPARISON:  08/15/2016 FINDINGS: Multifocal bilateral airspace opacity. Normal cardiomediastinal contours. No pleural effusion or pneumothorax. IMPRESSION: Multifocal airspace opacities, consistent with multifocal infection. Followup PA and lateral chest X-ray is recommended in 3-4 weeks following trial of antibiotic therapy to ensure resolution and exclude underlying malignancy. Electronically Signed   By: Ulyses Jarred M.D.   On: 02/10/2019 01:11    Procedures Procedures (including critical care time)  Medications Ordered in ED Medications  azithromycin (ZITHROMAX) 500 mg in sodium chloride 0.9 % 250 mL IVPB (0 mg Intravenous Stopped 02/10/19 0410)  acetaminophen (TYLENOL) tablet 1,000 mg (1,000 mg Oral Given 02/10/19 0038)  iohexol (OMNIPAQUE) 350 MG/ML injection 100 mL (100 mLs Intravenous Contrast Given 02/10/19 0332)  cefTRIAXone (ROCEPHIN) 1 g in sodium chloride 0.9 % 100 mL IVPB (0 g Intravenous Stopped 02/10/19 0300)     Initial Impression / Assessment and Plan / ED Course  I have reviewed the triage vital signs and the nursing notes.  Pertinent labs & imaging results that were available during my care of the patient were reviewed by me and considered in my medical decision making (see chart for details).        Patient presents to the emergency department for evaluation of difficulty breathing.  She has been experiencing progressively worsening shortness of breath for 1 week.  She had a positive Covid test November 17.  She had on her 14 days of quarantine and has been feeling better, but now symptoms  have restarted.  She is very dyspneic and tachypneic at arrival.  Temperature 103.4.  No leukocytosis or lactic acidosis.  Blood pressures have been slightly low to normal.  Chest x-ray shows bilateral pneumonia.  It is unclear if this is bacterial or secondary to Covid.  Initiated on Rocephin and Zithromax for coverage and will require hospitalization for further management.  Final Clinical Impressions(s) / ED Diagnoses   Final diagnoses:  Pneumonia due to COVID-19 virus    ED Discharge Orders    None       Orpah Greek, MD 02/10/19 [phone]

## 2019-02-10 NOTE — ED Notes (Signed)
Have given pt a breakfast tray. Currently getting ultrasound

## 2019-02-10 NOTE — Progress Notes (Signed)
Patient seen and examined.  Admitted after midnight secondary to shortness of breath and fever.  Patient reports being tested positive for COVID-19 on January 18, 2019 and eat approximately 14 days quarantine at home.  Despite these patient's symptoms of shortness of breath, fever clear phlegm  productive cough continue worsening.  Patient also developed dysuria and was started empirically on Bactrim by her PCP. Given ongoing symptoms she presented to ED for further evaluation and management.  Patient COVID-19 repeat test positive, chest x-ray and CT angiogram demonstrating multifocal pneumonia consistent with viral infection.  Patient was found to be febrile with a temperature of 103.4 and mildly hypoxic requiring 2 L nasal cannula supplementation.  There were also findings for acute kidney injury and mild transaminitis.  Please refer to H&P written by Dr. Darrick Meigs on 02/10/2019 for further info/details on admission.  Plan: -Continue IV fluids -started on remdesivir and steroids -will follow inflammatory markers and base on results determine if continuation of empiric antibiotics needed. -CT chest demonstarted no PE -follow abd [place] for transaminitis. -continue IVF's and supportive care; follow renal function and electrolytes.   Barton Dubois MD [phone]

## 2019-02-10 NOTE — ED Notes (Signed)
Dr. Lama at bedside. 

## 2019-02-10 NOTE — H&P (Signed)
TRH H&P    Patient Demographics:    Grace Moyer, is a 58 y.o. female  MRN: [id number]  DOB - 03/19/1960  Admit Date - 02/09/2019  Referring MD/NP/PA: Adriana Mccallum  Outpatient Primary MD for the patient is Maisie Fus, MD  Patient coming from: Home  Chief complaint-shortness of breath   HPI:    Grace Moyer  is a 58 y.o. 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  Patient says that she was tested positive for Covid on 17 November and did a 14-day quarantine at home.  Over past few days patient noted that she was having worsening shortness of breath.  Also reported the 2-day history of urinary frequency and dysuria with frequent history of UTIs.  Her PCP put her on Bactrim to cover for UTI as well as for cough and congestion.  Tonight her breathing difficulty but worse so patient came to the ED for further evaluation.  She also has a history of bilateral PE after she had 2 surgeries for breast cancer.  Patient is not on anticoagulation.  In the ED CTA chest was done which was negative for pulmonary embolism but showed multifocal pneumonia consistent with COVID-19 pneumonia.  Patient was started on ceftriaxone and Zithromax empirically for community-acquired pneumonia. She had temperature f 103.4.  O2 sat 97% on 2 L/min of nasal cannula  She denies nausea vomiting or diarrhea. Denies chest pain Admits to coughing up clear phlegm.     Review of systems:    In addition to the HPI above,   All other systems reviewed and are negative.    Past History of the following :    Past Medical History:  Diagnosis Date   Anxiety    Breast cancer ([place])    Family history of breast cancer    Family history of colon cancer    Malignant neoplasm of upper-outer quadrant of left female breast ([place]) 01/31/2016   Neuromyelitis optica  ([place])    Personal history of chemotherapy    Personal history of radiation therapy       Past Surgical History:  Procedure Laterality Date   ABDOMINAL HYSTERECTOMY     partial   BREAST BIOPSY Left 2017   BREAST LUMPECTOMY Left    2018   BREAST LUMPECTOMY WITH RADIOACTIVE SEED AND SENTINEL LYMPH NODE BIOPSY Left 07/31/2016   Procedure: LEFT BREAST RADIOACTIVE SEED GUIDED LUMPECTOMY WITH LEFT RADIOACTIVE SEED TARGETED AXILLARY SENTINEL LYMPH NODE EXCISION AND SENTINEL LYMPH NODE BIOPSY;  Surgeon: Rolm Bookbinder, MD;  Location: [facility];  Service: General;  Laterality: Left;   BREAST REDUCTION SURGERY Bilateral 08/05/2016   Procedure: LEFT ONCOPLASTIC REDUCTION; RIGHT BREAST REDUCTION;  Surgeon: Irene Limbo, MD;  Location: [facility];  Service: Plastics;  Laterality: Bilateral;   EYE SURGERY     PORT-A-CATH REMOVAL Right 07/31/2016   Procedure: REMOVAL PORT-A-CATH;  Surgeon: Rolm Bookbinder, MD;  Location: [facility];  Service: General;  Laterality: Right;   PORTACATH  PLACEMENT Right 02/11/2016   Procedure: INSERTION PORT-A-CATH WITH [place];  Surgeon: Rolm Bookbinder, MD;  Location: [facility];  Service: General;  Laterality: Right;   REDUCTION MAMMAPLASTY        Social History:      Social History   Tobacco Use   Smoking status: Former Smoker    Types: Cigarettes    Quit date: 09/03/2001    Years since quitting: 17.4   Smokeless tobacco: Never Used  Substance Use Topics   Alcohol use: Yes    Comment: social       Family History :     Family History  Problem Relation Age of Onset   Breast cancer Maternal Grandmother        dx in her 61s   CAD Paternal Grandmother    Colon cancer Paternal Uncle        dx in his 30s   COPD Maternal Grandfather    CAD Paternal Grandfather    Breast cancer Cousin        dx in her early to mid 53s; maternal first cousin   Lung cancer Paternal Uncle        Home Medications:   Prior to Admission medications   Medication Sig Start Date End Date Taking? Authorizing Provider  aspirin 81 MG tablet Take 1 tablet (81 mg total) by mouth daily. 03/30/17   Magrinat, Virgie Dad, MD  Cholecalciferol (VITAMIN D-1000 MAX ST) 1000 units tablet Take 2 tablets by mouth daily.    [provider]  gabapentin (NEURONTIN) 100 MG capsule Take 1 capsule (100 mg total) by mouth 3 (three) times daily. Patient taking differently: Take 300 mg by mouth daily.  12/23/17   Truitt Merle, MD     Allergies:     Allergies  Allergen Reactions   Hydrocodone Other (See Comments)    Did not help with pain and kept her up all night     Physical Exam:   Vitals  Blood pressure 105/67, pulse 70, temperature 98.6 F (37 C), temperature source Oral, resp. rate (!) 23, height 5' 3"  (1.6 m), weight 95.3 kg, SpO2 97 %.  1.  General: Appears in no acute distress  2. Psychiatric: Alert, oriented x3, intact insight and judgment  3. Neurologic: Cranial nerves II through XII grossly intact, no focal deficit noted  4. HEENMT:  Atraumatic normocephalic, extraocular muscles intact  5. Respiratory : Clear to auscultation bilaterally  6. Cardiovascular : S1-S2, regular, no murmur auscultated  7. Gastrointestinal:  Abdomen is soft, nontender, no organomegaly     Data Review:    CBC Recent Labs  Lab 02/10/19 0020  WBC 3.0*  HGB 13.9  HCT 42.5  PLT 220  MCV 88.4  MCH 28.9  MCHC 32.7  RDW 12.6  LYMPHSABS 0.4*  MONOABS 0.2  EOSABS 0.1  BASOSABS 0.0   ------------------------------------------------------------------------------------------------------------------  Results for orders placed or performed during the hospital encounter of 02/09/19 (from the past 48 hour(s))  CBC with Differential     Status: Abnormal   Collection Time: 02/10/19 12:20 AM  Result Value Ref Range   WBC 3.0 (L) 4.0 - 10.5 K/uL   RBC 4.81 3.87 - 5.11 MIL/uL    Hemoglobin 13.9 12.0 - 15.0 g/dL   HCT 42.5 36.0 - 46.0 %   MCV 88.4 80.0 - 100.0 fL   MCH 28.9 26.0 - 34.0 pg   MCHC 32.7 30.0 - 36.0 g/dL   RDW 12.6 11.5 - 15.5 %  Platelets 220 150 - 400 K/uL   nRBC 0.0 0.0 - 0.2 %   Neutrophils Relative % 75 %   Neutro Abs 2.2 1.7 - 7.7 K/uL   Lymphocytes Relative 15 %   Lymphs Abs 0.4 (L) 0.7 - 4.0 K/uL   Monocytes Relative 7 %   Monocytes Absolute 0.2 0.1 - 1.0 K/uL   Eosinophils Relative 2 %   Eosinophils Absolute 0.1 0.0 - 0.5 K/uL   Basophils Relative 0 %   Basophils Absolute 0.0 0.0 - 0.1 K/uL   Immature Granulocytes 1 %   Abs Immature Granulocytes 0.02 0.00 - 0.07 K/uL    Comment: Performed at [facility], [address] [place], [place] [address]  Comprehensive metabolic panel     Status: Abnormal   Collection Time: 02/10/19 12:20 AM  Result Value Ref Range   Sodium 137 135 - 145 mmol/L   Potassium 3.6 3.5 - 5.1 mmol/L   Chloride 100 98 - 111 mmol/L   CO2 24 22 - 32 mmol/L   Glucose, Bld 105 (H) 70 - 99 mg/dL   BUN 17 6 - 20 mg/dL   Creatinine, Ser 1.24 (H) 0.44 - 1.00 mg/dL   Calcium 8.6 (L) 8.9 - 10.3 mg/dL   Total Protein 7.3 6.5 - 8.1 g/dL   Albumin 3.7 3.5 - 5.0 g/dL   AST 57 (H) 15 - 41 U/L   ALT 58 (H) 0 - 44 U/L   Alkaline Phosphatase 333 (H) 38 - 126 U/L   Total Bilirubin 0.4 0.3 - 1.2 mg/dL   GFR calc non Af Amer 48 (L) >60 mL/min   GFR calc Af Amer 56 (L) >60 mL/min   Anion gap 13 5 - 15    Comment: Performed at [facility], [address], [place], [place] [address]  Protime-INR     Status: None   Collection Time: 02/10/19 12:20 AM  Result Value Ref Range   Prothrombin Time 13.6 11.4 - 15.2 seconds   INR 1.1 0.8 - 1.2    Comment: (NOTE) INR goal varies based on device and disease states. Performed at [facility], [address], [place], [place] [address]   APTT     Status: None   Collection Time: 02/10/19 12:20 AM  Result Value Ref Range   aPTT 31 24 - 36 seconds    Comment: Performed at [facility], [address], [place], [place] [address]  Lactic acid, plasma     Status: None   Collection Time: 02/10/19 12:45 AM  Result Value Ref Range   Lactic Acid, Venous 1.5 0.5 - 1.9 mmol/L    Comment: Performed at [facility], [address], [place], [place] [address]  SARS Coronavirus 2 by RT PCR (hospital order, performed in [facility] hospital lab)     Status: Abnormal   Collection Time: 02/10/19  3:00 AM  Result Value Ref Range   SARS Coronavirus 2 POSITIVE (A) NEGATIVE    Comment: RESULT CALLED TO, READ BACK BY AND VERIFIED WITH: C TURNER,RN @0413  02/10/19 MKELLY (NOTE) SARS-CoV-2 target nucleic acids are DETECTED SARS-CoV-2 RNA is generally detectable in upper respiratory specimens  during the acute phase of infection.  Positive results are indicative  of the presence of the identified virus, but do not rule out bacterial infection or co-infection with other pathogens not detected by the test.  Clinical correlation with patient history and  other diagnostic information is necessary to determine patient infection status.  The expected result is  negative. Fact Sheet for Patients:   StrictlyIdeas.no  Fact Sheet for Healthcare Providers:   BankingDealers.co.za   This test is not yet approved or cleared by the [place] FDA and  has been authorized for detection and/or diagnosis of SARS-CoV-2 by FDA under an Emergency Use Authorization (EUA).  This EUA will remain in effect (meaning this test can be  used) for the duration of  the COVID-19 declaration under Section 564(b)(1) of the Act, 21 U.S.C. section 360-bbb-3(b)(1), unless the authorization is terminated or revoked sooner. Performed at [facility], [address], [place], [place] [address]     Chemistries  Recent Labs  Lab 02/10/19 0020  NA 137  K 3.6  CL 100  CO2 24  GLUCOSE 105*  BUN 17  CREATININE 1.24*  CALCIUM 8.6*  AST 57*  ALT 58*  ALKPHOS 333*  BILITOT  0.4   ------------------------------------------------------------------------------------------------------------------  ------------------------------------------------------------------------------------------------------------------ GFR: Estimated Creatinine Clearance: 55 mL/min (A) (by C-G formula based on SCr of 1.24 mg/dL (H)). Liver Function Tests: Recent Labs  Lab 02/10/19 0020  AST 57*  ALT 58*  ALKPHOS 333*  BILITOT 0.4  PROT 7.3  ALBUMIN 3.7   No results for input(s): LIPASE, AMYLASE in the last 168 hours. No results for input(s): AMMONIA in the last 168 hours. Coagulation Profile: Recent Labs  Lab 02/10/19 0020  INR 1.1    --------------------------------------------------------------------------------------------------------------- Urine analysis:    Component Value Date/Time   COLORURINE YELLOW 08/14/2015 [place] 08/14/2015 0841   LABSPEC 1.015 08/14/2015 0841   PHURINE 6.5 08/14/2015 0841   GLUCOSEU NEGATIVE 08/14/2015 0841   HGBUR NEGATIVE 08/14/2015 0841   BILIRUBINUR SMALL (A) 08/14/2015 0841   KETONESUR >80 (A) 08/14/2015 0841   PROTEINUR TRACE (A) 08/14/2015 0841   UROBILINOGEN 0.2 04/08/2007 0620   NITRITE NEGATIVE 08/14/2015 0841   LEUKOCYTESUR NEGATIVE 08/14/2015 0841      Imaging Results:    CT ANGIO CHEST PE W OR WO CONTRAST  Result Date: 02/10/2019 CLINICAL DATA:  Shortness of breath. Recent history of COVID-19. EXAM: CT ANGIOGRAPHY CHEST WITH CONTRAST TECHNIQUE: Multidetector CT imaging of the chest was performed using the standard protocol during bolus administration of intravenous contrast. Multiplanar CT image reconstructions and MIPs were obtained to evaluate the vascular anatomy. CONTRAST:  126m OMNIPAQUE IOHEXOL 350 MG/ML SOLN COMPARISON:  Chest radiograph earlier this day. Chest CTA 08/15/2016. FINDINGS: Cardiovascular: There are no filling defects within the pulmonary arteries to suggest pulmonary embolus.  Evaluation is diagnostic to the segmental level. The AV is question bilateral lower lobe segmental pulmonary emboli are no longer seen. No aortic dissection. Heart is normal in size. No pericardial effusion. Mediastinum/Nodes: No enlarged mediastinal or hilar lymph nodes. No axillary adenopathy. No suspicious thyroid nodule. Slightly patulous esophagus without wall thickening. Tiny hiatal hernia. Lungs/Pleura: Bilateral patchy ground-glass opacities throughout both lungs. There scattered air bronchograms and slightly consolidative opacities, particularly in the lower lobes. Pleural effusion. No pulmonary edema. The trachea and mainstem bronchi are patent. Upper Abdomen: No acute abnormality. Musculoskeletal: There are no acute or suspicious osseous abnormalities. Left axillary surgical clips. Review of the MIP images confirms the above findings. IMPRESSION: 1. No pulmonary embolus. 2. Bilateral patchy ground-glass and consolidative opacities throughout both lungs, pattern consistent with COVID-19 pneumonia. Electronically Signed   By: MKeith RakeM.D.   On: 02/10/2019 03:50   DG Chest Port 1 View  Result Date: 02/10/2019 CLINICAL DATA:  Shortness of breath.  COVID-19 positive. EXAM: PORTABLE CHEST 1 VIEW  COMPARISON:  08/15/2016 FINDINGS: Multifocal bilateral airspace opacity. Normal cardiomediastinal contours. No pleural effusion or pneumothorax. IMPRESSION: Multifocal airspace opacities, consistent with multifocal infection. Followup PA and lateral chest X-ray is recommended in 3-4 weeks following trial of antibiotic therapy to ensure resolution and exclude underlying malignancy. Electronically Signed   By: Ulyses Jarred M.D.   On: 02/10/2019 01:11    My personal review of EKG: Rhythm NSR, nonspecific ST changes   Assessment & Plan:    Active Problems:   Pneumonia due to COVID-19 virus   1. Acute hypoxic respiratory failure-multifocal pneumonia seen on chest x-ray, SARS Covid 2 test is again  positive.  Will start remdesivir per pharmacy consultation, Decadron 6 mg IV daily.  Will obtain procalcitonin, ferritin, CRP, LDH, D-dimer.  Patient did receive 1 dose of ceftriaxone and Zithromax in the ED.  Consider continuing with antibiotics if procalcitonin is elevated.  2. Transaminitis-patient has mild elevation of AST and ALT with alk phos 333.  She has no abdominal pain.  Will obtain abdominal ultrasound.  3. Acute kidney injury-patient baseline creatinine 0.89 as of July 2020.  Today creatinine is 1.24.  Will start gentle IV hydration normal saline at 50 mill per hour for 12 hours only.  Follow BMP in a.m.    DVT Prophylaxis-   Lovenox   AM Labs Ordered, also please review Full Orders  Family Communication: Admission, patients condition and plan of care including tests being ordered have been discussed with the patient and her daughter at bedside who indicate understanding and agree with the plan and Code Status.  Code Status: Full code  Admission status: Inpatient: Based on patients clinical presentation and evaluation of above clinical data, I have made determination that patient meets Inpatient criteria at this time.  Time spent in minutes : 60 minutes   Anastacia Reinecke S Rivan Siordia M.D

## 2019-02-10 NOTE — ED Notes (Signed)
Per SWOT nurse Tiffany, bring patient up. Gave report to Tiffany prior to transport

## 2019-02-11 DIAGNOSIS — R7401 Elevation of levels of liver transaminase levels: Secondary | ICD-10-CM

## 2019-02-11 DIAGNOSIS — J9601 Acute respiratory failure with hypoxia: Secondary | ICD-10-CM

## 2019-02-11 DIAGNOSIS — Z6836 Body mass index (BMI) 36.0-36.9, adult: Secondary | ICD-10-CM

## 2019-02-11 DIAGNOSIS — R739 Hyperglycemia, unspecified: Secondary | ICD-10-CM

## 2019-02-11 DIAGNOSIS — E6609 Other obesity due to excess calories: Secondary | ICD-10-CM

## 2019-02-11 LAB — COMPREHENSIVE METABOLIC PANEL
ALT: 42 U/L (ref 0–44)
AST: 29 U/L (ref 15–41)
Albumin: 3 g/dL — ABNORMAL LOW (ref 3.5–5.0)
Alkaline Phosphatase: 259 U/L — ABNORMAL HIGH (ref 38–126)
Anion gap: 10 (ref 5–15)
BUN: 16 mg/dL (ref 6–20)
CO2: 20 mmol/L — ABNORMAL LOW (ref 22–32)
Calcium: 8.4 mg/dL — ABNORMAL LOW (ref 8.9–10.3)
Chloride: 109 mmol/L (ref 98–111)
Creatinine, Ser: 0.74 mg/dL (ref 0.44–1.00)
GFR calc Af Amer: >60 mL/min (ref >60)
GFR calc non Af Amer: >60 mL/min (ref >60)
Glucose, Bld: 119 mg/dL — ABNORMAL HIGH (ref 70–99)
Potassium: 4.2 mmol/L (ref 3.5–5.1)
Sodium: 139 mmol/L (ref 135–145)
Total Bilirubin: 0.7 mg/dL (ref 0.3–1.2)
Total Protein: 6.1 g/dL — ABNORMAL LOW (ref 6.5–8.1)

## 2019-02-11 LAB — FERRITIN: Ferritin: 497 ng/mL — ABNORMAL HIGH (ref 11–307)

## 2019-02-11 LAB — D-DIMER, QUANTITATIVE: D-Dimer, Quant: 0.5 ug/mL-FEU (ref 0.00–0.50)

## 2019-02-11 LAB — C-REACTIVE PROTEIN: CRP: 7.3 mg/dL — ABNORMAL HIGH (ref <1.0)

## 2019-02-11 LAB — GLUCOSE, CAPILLARY: Glucose-Capillary: 165 mg/dL — ABNORMAL HIGH (ref 70–99)

## 2019-02-11 LAB — HEMOGLOBIN A1C
Hgb A1c MFr Bld: 6.1 % — ABNORMAL HIGH (ref 4.8–5.6)
Mean Plasma Glucose: 128.37 mg/dL

## 2019-02-11 LAB — URINALYSIS, ROUTINE W REFLEX MICROSCOPIC
Bilirubin Urine: NEGATIVE
Glucose, UA: NEGATIVE mg/dL
Hgb urine dipstick: NEGATIVE
Ketones, ur: 20 mg/dL — AB
Leukocytes,Ua: NEGATIVE
Nitrite: NEGATIVE
Protein, ur: NEGATIVE mg/dL
Specific Gravity, Urine: 1.02 (ref 1.005–1.030)
pH: 6 (ref 5.0–8.0)

## 2019-02-11 MED ORDER — INSULIN DETEMIR 100 UNIT/ML ~~LOC~~ SOLN
20.0000 [IU] | Freq: Every day | SUBCUTANEOUS | Status: DC
  Filled 2019-02-11 (×3): qty 0.2

## 2019-02-11 MED ORDER — INSULIN ASPART 100 UNIT/ML ~~LOC~~ SOLN
0.0000 [IU] | Freq: Three times a day (TID) | SUBCUTANEOUS | Status: DC
  Administered 2019-02-12 (×2): 3 [IU] via SUBCUTANEOUS
  Administered 2019-02-13: 4 [IU] via SUBCUTANEOUS

## 2019-02-11 NOTE — Progress Notes (Signed)
PROGRESS NOTE    Grace Moyer  [id number] DOB: 07/15/1960 DOA: 02/09/2019 PCP: Maisie Fus, MD    Brief Narrative:  As per H&P written by Dr. Darrick Meigs on 02/10/2019 58 y.o. 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  Patient says that she was tested positive for Covid on 17 November and did a 14-day quarantine at home.  Over past few days patient noted that she was having worsening shortness of breath.  Also reported the 2-day history of urinary frequency and dysuria with frequent history of UTIs.  Her PCP put her on Bactrim to cover for UTI as well as for cough and congestion.  Tonight her breathing difficulty but worse so patient came to the ED for further evaluation.  She also has a history of bilateral PE after she had 2 surgeries for breast cancer.  Patient is not on anticoagulation.  In the ED CTA chest was done which was negative for pulmonary embolism but showed multifocal pneumonia consistent with COVID-19 pneumonia.  Patient was started on ceftriaxone and Zithromax empirically for community-acquired pneumonia. She had temperature f 103.4.  O2 sat 97% on 2 L/min of nasal cannula  She denies nausea vomiting or diarrhea. Denies chest pain Admits to coughing up clear phlegm.   Assessment & Plan: Acute respiratory failure with hypoxia in the setting of COVID-19 pneumonia -Patient requiring 2 L nasal cannula supplementation on presentation -Chest x-ray demonstrating bilateral infiltrates -Covid test positive. -Inflammatory markers have started to trend down -Breathing slowly stabilizing -Will start incentive spirometer -Continue IV steroids and remdesivir (day #2) -continue vit C and Zinc. -will continue empiric tx with erythromycin.   Mild transaminitis -Limited study secondary to body habitus -Hepatic acidosis without signs of cholecystitis or cirrhosis. -Continue to follow LFTs trend  Class  II obesity -Body mass index is 36.83 kg/m. -Low calorie diet, portion control and increase physical activity discussed with patient.  Acute kidney injury -Creatinine on presentation 1.24 -Baseline 0.89 -Improvement appreciated with fluid resuscitation -Continue to minimize/avoid nephrotoxic agents  Hyperglycemia -No prior history of diabetes -Could be associated to the use of a steroids -Will check A1c -will start patient on insulin therapy.    DVT prophylaxis: Lovenox Code Status: Full code Family Communication: No family bedside Disposition Plan: Patient is slowly responding appropriately; reports breathing slightly better today and was able to come off oxygen supplementation midmorning.  Still having difficulty taking full deep breath, intermittent dry coughing spells and feeling short of breath on exertion.  Continue IV steroids and remdesivir (cursing day #2)  Consultants:   None  Procedures:   See below for x-ray reports  Antimicrobials:  Anti-infectives (From admission, onward)   Start     Dose/Rate Route Frequency Ordered Stop   02/11/19 1000  remdesivir 100 mg in sodium chloride 0.9 % 100 mL IVPB  Status:  Discontinued     100 mg 200 mL/hr over 30 Minutes Intravenous Daily 02/10/19 0716 02/10/19 0837   02/11/19 1000  remdesivir 100 mg in sodium chloride 0.9 % 100 mL IVPB     100 mg 200 mL/hr over 30 Minutes Intravenous Daily 02/10/19 0644 02/15/19 0959   02/10/19 0800  remdesivir 200 mg in sodium chloride 0.9% 250 mL IVPB     200 mg 580 mL/hr over 30 Minutes Intravenous Once 02/10/19 0644 02/10/19 1637   02/10/19 0716  remdesivir 200 mg in sodium chloride 0.9% 250 mL IVPB  Status:  Discontinued  200 mg 580 mL/hr over 30 Minutes Intravenous Once 02/10/19 0716 02/10/19 0837   02/10/19 0330  azithromycin (ZITHROMAX) 500 mg in sodium chloride 0.9 % 250 mL IVPB     500 mg 250 mL/hr over 60 Minutes Intravenous Every 24 hours 02/10/19 0324     02/10/19 0230   cefTRIAXone (ROCEPHIN) 1 g in sodium chloride 0.9 % 100 mL IVPB     1 g 200 mL/hr over 30 Minutes Intravenous  Once 02/10/19 0324 02/10/19 0300      Subjective: Afebrile, no chest pain, no nausea, no vomiting.  Patient reports breathing is improving.  Still short of breath on exertion and having difficulty with her ability to take deep breath.  Objective: Vitals:   02/10/19 1130 02/10/19 1221 02/10/19 2311 02/11/19 0435  BP: 108/67 98/74 124/72 111/67  Pulse: 77 72 (!) 59 64  Resp: (!) 24 (!) 22 18 18   Temp:  97.9 F (36.6 C) 98.1 F (36.7 C) 98.4 F (36.9 C)  TempSrc:  Oral Oral Oral  SpO2: 95% 97% 93% 96%  Weight:  94.3 kg    Height:  5\' 3"  (1.6 m)      Intake/Output Summary (Last 24 hours) at 02/11/2019 1611 Last data filed at 02/11/2019 1500 Gross per 24 hour  Intake 820 ml  Output --  Net 820 ml   Filed Weights   02/09/19 2355 02/10/19 1221  Weight: 95.3 kg 94.3 kg    Examination: General exam: Alert, awake, oriented x 3, will short of breath having difficulty taking deep breath.  Reports dry coughing spells.  Afebrile. Respiratory system: Decreased breath sounds at the bases, positive rhonchi diffusely; no wheezing.  No using accessory muscles. Cardiovascular system:RRR. No murmurs, rubs, gallops. Gastrointestinal system: Abdomen is obese, nondistended, soft and nontender. No organomegaly or masses felt. Normal bowel sounds heard. Central nervous system: Alert and oriented. No focal neurological deficits. Extremities: No cyanosis or clubbing. Skin: No rashes, no open wounds; no petechiae. Psychiatry: Judgement and insight appear normal. Mood & affect appropriate.     Data Reviewed: I have personally reviewed following labs and imaging studies  CBC: Recent Labs  Lab 02/10/19 0020 02/10/19 0841  WBC 3.0* 3.3*  NEUTROABS 2.2  --   HGB 13.9 13.1  HCT 42.5 40.6  MCV 88.4 89.8  PLT 220 [id number]   Basic Metabolic Panel: Recent Labs  Lab 02/10/19 0020  02/10/19 0841 02/11/19 0623  NA 137  --  139  K 3.6  --  4.2  CL 100  --  109  CO2 24  --  20*  GLUCOSE 105*  --  119*  BUN 17  --  16  CREATININE 1.24* 1.00 0.74  CALCIUM 8.6*  --  8.4*   GFR: Estimated Creatinine Clearance: 84.8 mL/min (by C-G formula based on SCr of 0.74 mg/dL).   Liver Function Tests: Recent Labs  Lab 02/10/19 0020 02/11/19 0623  AST 57* 29  ALT 58* 42  ALKPHOS 333* 259*  BILITOT 0.4 0.7  PROT 7.3 6.1*  ALBUMIN 3.7 3.0*   Coagulation Profile: Recent Labs  Lab 02/10/19 0020  INR 1.1   Anemia Panel: Recent Labs    02/10/19 0842 02/11/19 0623  FERRITIN 550* 497*   Urine analysis:    Component Value Date/Time   COLORURINE YELLOW 02/11/2019 0025   APPEARANCEUR CLEAR 02/11/2019 0025   LABSPEC 1.020 02/11/2019 0025   PHURINE 6.0 02/11/2019 0025   GLUCOSEU NEGATIVE 02/11/2019 0025   HGBUR NEGATIVE 02/11/2019 0025  BILIRUBINUR NEGATIVE 02/11/2019 0025   KETONESUR 20 (A) 02/11/2019 0025   PROTEINUR NEGATIVE 02/11/2019 0025   UROBILINOGEN 0.2 04/08/2007 0620   NITRITE NEGATIVE 02/11/2019 0025   LEUKOCYTESUR NEGATIVE 02/11/2019 0025    Recent Results (from the past 240 hour(s))  Culture, blood (routine x 2)     Status: None (Preliminary result)   Collection Time: 02/10/19 12:49 AM   Specimen: BLOOD  Result Value Ref Range Status   Specimen Description BLOOD RIGHT ANTECUBITAL  Final   Special Requests   Final    BOTTLES DRAWN AEROBIC AND ANAEROBIC Blood Culture adequate volume   Culture   Final    NO GROWTH 1 DAY Performed at [facility], [address] [place], [place] [address]    Report Status PENDING  Incomplete  Culture, blood (routine x 2)     Status: None (Preliminary result)   Collection Time: 02/10/19 12:50 AM   Specimen: BLOOD  Result Value Ref Range Status   Specimen Description BLOOD RIGHT ANTECUBITAL  Final   Special Requests   Final    BOTTLES DRAWN AEROBIC AND ANAEROBIC Blood Culture results may not be optimal due to  an inadequate volume of blood received in culture bottles   Culture   Final    NO GROWTH 1 DAY Performed at [facility], [address], [place], [place] [address]    Report Status PENDING  Incomplete  SARS Coronavirus 2 by RT PCR (hospital order, performed in [place] hospital lab)     Status: Abnormal   Collection Time: 02/10/19  3:00 AM  Result Value Ref Range Status   SARS Coronavirus 2 POSITIVE (A) NEGATIVE Final    Comment: RESULT CALLED TO, READ BACK BY AND VERIFIED WITH: C TURNER,RN @0413  02/10/19 MKELLY (NOTE) SARS-CoV-2 target nucleic acids are DETECTED SARS-CoV-2 RNA is generally detectable in upper respiratory specimens  during the acute phase of infection.  Positive results are indicative  of the presence of the identified virus, but do not rule out bacterial infection or co-infection with other pathogens not detected by the test.  Clinical correlation with patient history and  other diagnostic information is necessary to determine patient infection status.  The expected result is negative. Fact Sheet for Patients:   StrictlyIdeas.no  Fact Sheet for Healthcare Providers:   BankingDealers.co.za   This test is not yet approved or cleared by the [place] FDA and  has been authorized for detection and/or diagnosis of SARS-CoV-2 by FDA under an Emergency Use Authorization (EUA).  This EUA will remain in effect (meaning this test can be  used) for the duration of  the COVID-19 declaration under Section 564(b)(1) of the Act, 21 U.S.C. section 360-bbb-3(b)(1), unless the authorization is terminated or revoked sooner. Performed at [facility], [address] [place], [place] [address]      Radiology Studies: CT ANGIO CHEST PE W OR WO CONTRAST  Result Date: 02/10/2019 CLINICAL DATA:  Shortness of breath. Recent history of COVID-19. EXAM: CT ANGIOGRAPHY CHEST WITH CONTRAST TECHNIQUE: Multidetector CT imaging of the chest  was performed using the standard protocol during bolus administration of intravenous contrast. Multiplanar CT image reconstructions and MIPs were obtained to evaluate the vascular anatomy. CONTRAST:  153mL OMNIPAQUE IOHEXOL 350 MG/ML SOLN COMPARISON:  Chest radiograph earlier this day. Chest CTA 08/15/2016. FINDINGS: Cardiovascular: There are no filling defects within the pulmonary arteries to suggest pulmonary embolus. Evaluation is diagnostic to the segmental level. The AV is question bilateral lower lobe segmental pulmonary emboli  are no longer seen. No aortic dissection. Heart is normal in size. No pericardial effusion. Mediastinum/Nodes: No enlarged mediastinal or hilar lymph nodes. No axillary adenopathy. No suspicious thyroid nodule. Slightly patulous esophagus without wall thickening. Tiny hiatal hernia. Lungs/Pleura: Bilateral patchy ground-glass opacities throughout both lungs. There scattered air bronchograms and slightly consolidative opacities, particularly in the lower lobes. Pleural effusion. No pulmonary edema. The trachea and mainstem bronchi are patent. Upper Abdomen: No acute abnormality. Musculoskeletal: There are no acute or suspicious osseous abnormalities. Left axillary surgical clips. Review of the MIP images confirms the above findings. IMPRESSION: 1. No pulmonary embolus. 2. Bilateral patchy ground-glass and consolidative opacities throughout both lungs, pattern consistent with COVID-19 pneumonia. Electronically Signed   By: Keith Rake M.D.   On: 02/10/2019 03:50   Abdominal ultrasound  Result Date: 02/10/2019 CLINICAL DATA:  Elevated liver enzymes, shortness of breath EXAM: ABDOMEN ULTRASOUND COMPLETE COMPARISON:  CT angiography of the chest dated 02/10/2019 FINDINGS: Gallbladder: No gallstones or wall thickening visualized. No sonographic Murphy sign noted by sonographer. Common bile duct: Diameter: 3 mm Liver: Liver with very limited assessment. This is due to patient body  habitus. Echogenicity appears increased. Portal vein is patent on color Doppler imaging with normal direction of blood flow towards the liver. IVC: No abnormality visualized. Pancreas: Not seen due to overlying bowel gas. Spleen: Size and appearance within normal limits. Right Kidney: Length: 11.3 cm. Limited assessment without signs of hydronephrosis or visible calculus. Left Kidney: Length: 12 cm. Limited assessment, no signs of hydronephrosis or visible calculus. Abdominal aorta: Assessment limited secondary to overlying bowel gas. Mid aortic caliber proximally 2 cm. Other findings: Study limited by overlying bowel gas. IMPRESSION: 1. Limited study due to patient body habitus. 2. Hepatic steatosis without signs of cholecystitis. 3. Pancreas and proximal aorta not well seen due to bowel gas.  Are Electronically Signed   By: Zetta Bills M.D.   On: 02/10/2019 09:22   DG Chest Port 1 View  Result Date: 02/10/2019 CLINICAL DATA:  Shortness of breath.  COVID-19 positive. EXAM: PORTABLE CHEST 1 VIEW COMPARISON:  08/15/2016 FINDINGS: Multifocal bilateral airspace opacity. Normal cardiomediastinal contours. No pleural effusion or pneumothorax. IMPRESSION: Multifocal airspace opacities, consistent with multifocal infection. Followup PA and lateral chest X-ray is recommended in 3-4 weeks following trial of antibiotic therapy to ensure resolution and exclude underlying malignancy. Electronically Signed   By: Ulyses Jarred M.D.   On: 02/10/2019 01:11    Scheduled Meds: . aspirin  81 mg Oral Daily  . cholecalciferol  2,000 Units Oral Daily  . dexamethasone (DECADRON) injection  6 mg Intravenous Q24H  . enoxaparin (LOVENOX) injection  40 mg Subcutaneous Q24H  . gabapentin  300 mg Oral Daily  . sodium chloride flush  3 mL Intravenous Q12H  . vitamin C  500 mg Oral Daily  . zinc sulfate  220 mg Oral Daily   Continuous Infusions: . sodium chloride    . azithromycin 500 mg (02/11/19 0438)  . remdesivir 100  mg in NS 100 mL 100 mg (02/11/19 0838)     LOS: 1 day    Time spent: 35 minutes. Greater than 50% of this time was spent in direct contact with the patient, coordinating care and discussing relevant ongoing clinical issues, including acute Covid pneumonia and hypoxia. Will continue steroids and remdesivir.    Barton Dubois, MD [facility] Pager [phone]   02/11/2019, 4:11 PM

## 2019-02-12 LAB — COMPREHENSIVE METABOLIC PANEL
ALT: 33 U/L (ref 0–44)
AST: 21 U/L (ref 15–41)
Albumin: 3.2 g/dL — ABNORMAL LOW (ref 3.5–5.0)
Alkaline Phosphatase: 225 U/L — ABNORMAL HIGH (ref 38–126)
Anion gap: 10 (ref 5–15)
BUN: 21 mg/dL — ABNORMAL HIGH (ref 6–20)
CO2: 23 mmol/L (ref 22–32)
Calcium: 8.6 mg/dL — ABNORMAL LOW (ref 8.9–10.3)
Chloride: 106 mmol/L (ref 98–111)
Creatinine, Ser: 0.79 mg/dL (ref 0.44–1.00)
GFR calc Af Amer: >60 mL/min (ref >60)
GFR calc non Af Amer: >60 mL/min (ref >60)
Glucose, Bld: 122 mg/dL — ABNORMAL HIGH (ref 70–99)
Potassium: 4.3 mmol/L (ref 3.5–5.1)
Sodium: 139 mmol/L (ref 135–145)
Total Bilirubin: 0.3 mg/dL (ref 0.3–1.2)
Total Protein: 6.3 g/dL — ABNORMAL LOW (ref 6.5–8.1)

## 2019-02-12 LAB — GLUCOSE, CAPILLARY
Glucose-Capillary: 110 mg/dL — ABNORMAL HIGH (ref 70–99)
Glucose-Capillary: 143 mg/dL — ABNORMAL HIGH (ref 70–99)
Glucose-Capillary: 136 mg/dL — ABNORMAL HIGH (ref 70–99)
Glucose-Capillary: 134 mg/dL — ABNORMAL HIGH (ref 70–99)

## 2019-02-12 LAB — URINE CULTURE: Culture: NO GROWTH

## 2019-02-12 LAB — D-DIMER, QUANTITATIVE: D-Dimer, Quant: 0.34 ug/mL-FEU (ref 0.00–0.50)

## 2019-02-12 LAB — C-REACTIVE PROTEIN: CRP: 2.7 mg/dL — ABNORMAL HIGH (ref <1.0)

## 2019-02-12 LAB — FERRITIN: Ferritin: 434 ng/mL — ABNORMAL HIGH (ref 11–307)

## 2019-02-12 MED ORDER — AZITHROMYCIN 250 MG PO TABS
500.0000 mg | ORAL_TABLET | Freq: Every day | ORAL | Status: DC
  Administered 2019-02-13: 500 mg via ORAL
  Filled 2019-02-12 (×2): qty 2

## 2019-02-12 NOTE — Progress Notes (Signed)
PROGRESS NOTE    Grace Moyer  [id number] DOB: 04-Mar-1960 DOA: 02/09/2019 PCP: Maisie Fus, MD    Brief Narrative:  As per H&P written by Dr. Darrick Meigs on 02/10/2019 58 y.o. 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  Patient says that she was tested positive for Covid on 17 November and did a 14-day quarantine at home.  Over past few days patient noted that she was having worsening shortness of breath.  Also reported the 2-day history of urinary frequency and dysuria with frequent history of UTIs.  Her PCP put her on Bactrim to cover for UTI as well as for cough and congestion.  Tonight her breathing difficulty but worse so patient came to the ED for further evaluation.  She also has a history of bilateral PE after she had 2 surgeries for breast cancer.  Patient is not on anticoagulation.  In the ED CTA chest was done which was negative for pulmonary embolism but showed multifocal pneumonia consistent with COVID-19 pneumonia.  Patient was started on ceftriaxone and Zithromax empirically for community-acquired pneumonia. She had temperature f 103.4.  O2 sat 97% on 2 L/min of nasal cannula  She denies nausea vomiting or diarrhea. Denies chest pain Admits to coughing up clear phlegm.   Assessment & Plan: Acute respiratory failure with hypoxia in the setting of COVID-19 pneumonia -Patient requiring 2 L nasal cannula supplementation on presentation -Chest x-ray demonstrating bilateral infiltrates -Covid test positive. -Inflammatory markers have started to trend down -Breathing slowly stabilizing -Will start incentive spirometer -Continue IV steroids and remdesivir (day #3); given significant improvement will aim for 4 days treatment with remdesivir. -continue vit C and Zinc. -will continue empiric tx with erythromycin, but will transition to oral regimen..   Mild transaminitis -Limited study secondary to  body habitus -Hepatic steatosis without signs of cholecystitis or cirrhosis appreciated on ultrasound. -Continue to follow LFTs trend  Class II obesity -Body mass index is 36.83 kg/m. -Low calorie diet, portion control and increase physical activity discussed with patient.  Acute kidney injury -Creatinine on presentation 1.24 -Baseline 0.89 -After fluid resuscitation; renal function is back to normal. -Continue to minimize/avoid nephrotoxic agents  Hyperglycemia -No prior history of diabetes -Could be associated to the use of a steroids -A1c 6.1 -Patient meeting prediabetes criteria. -Lifestyle changes has been recommended; close follow-up as an outpatient to determine the need of initiation of hypoglycemic regimen.   DVT prophylaxis: Lovenox Code Status: Full code Family Communication: No family bedside Disposition Plan: Patient is slowly responding appropriately; reports breathing slightly better today and was able to come off oxygen supplementation midmorning.  Still having difficulty taking full deep breath, intermittent dry coughing spells and feeling short of breath on exertion.  Continue IV steroids and remdesivir (cursing day #3). Hopefully discharge home on 02/13/19  Consultants:   None  Procedures:   See below for x-ray reports  Antimicrobials:  Anti-infectives (From admission, onward)   Start     Dose/Rate Route Frequency Ordered Stop   02/13/19 1000  azithromycin (ZITHROMAX) tablet 500 mg     500 mg Oral Daily 02/12/19 0933     02/11/19 1000  remdesivir 100 mg in sodium chloride 0.9 % 100 mL IVPB  Status:  Discontinued     100 mg 200 mL/hr over 30 Minutes Intravenous Daily 02/10/19 0716 02/10/19 0837   02/11/19 1000  remdesivir 100 mg in sodium chloride 0.9 % 100 mL IVPB  100 mg 200 mL/hr over 30 Minutes Intravenous Daily 02/10/19 0644 02/15/19 0959   02/10/19 0800  remdesivir 200 mg in sodium chloride 0.9% 250 mL IVPB     200 mg 580 mL/hr over 30  Minutes Intravenous Once 02/10/19 0644 02/10/19 1637   02/10/19 0716  remdesivir 200 mg in sodium chloride 0.9% 250 mL IVPB  Status:  Discontinued     200 mg 580 mL/hr over 30 Minutes Intravenous Once 02/10/19 0716 02/10/19 0837   02/10/19 0330  azithromycin (ZITHROMAX) 500 mg in sodium chloride 0.9 % 250 mL IVPB  Status:  Discontinued     500 mg 250 mL/hr over 60 Minutes Intravenous Every 24 hours 02/10/19 0324 02/12/19 0933   02/10/19 0230  cefTRIAXone (ROCEPHIN) 1 g in sodium chloride 0.9 % 100 mL IVPB     1 g 200 mL/hr over 30 Minutes Intravenous  Once 02/10/19 0324 02/10/19 0300      Subjective: No fever, no chest pain, no nausea, no vomiting.  Patient reports breathing continued to improve.  Still having intermittent dry cough and also expressed feeling short of breath on exertion and having difficulty taking deep breath.  Objective: Vitals:   02/11/19 2058 02/12/19 0603 02/12/19 1300 02/12/19 1338  BP: (!) 106/57 109/71 94/64 94/64   Pulse: 62 (!) 54 62 62  Resp: 18 16 16 16   Temp: 98.6 F (37 C) 98.1 F (36.7 C) 97.6 F (36.4 C) 97.6 F (36.4 C)  TempSrc:   Oral Oral  SpO2: 95% 96% 97% 97%  Weight:      Height:        Intake/Output Summary (Last 24 hours) at 02/12/2019 1710 Last data filed at 02/12/2019 1500 Gross per 24 hour  Intake 480 ml  Output --  Net 480 ml   Filed Weights   02/09/19 2355 02/10/19 1221  Weight: 95.3 kg 94.3 kg    Examination: General exam: Alert, awake, oriented x 3; still complaining of intermittent nonproductive cough.  Patient is afebrile.  Good oxygen saturation on room air currently.  Reports still having some difficulty taking deep breath.  No chest pain Respiratory system: Mild tachypnea on exertion; good air movement bilaterally, no wheezing.  Positive rhonchi appreciated on exam. Cardiovascular system:RRR. No murmurs, rubs, gallops. Gastrointestinal system: Abdomen is nondistended, soft and nontender. No organomegaly or masses  felt. Normal bowel sounds heard. Central nervous system: Alert and oriented. No focal neurological deficits. Extremities: No C/C/E, +pedal pulses Skin: No rashes, lesions or ulcers Psychiatry: Judgement and insight appear normal. Mood & affect appropriate.    Data Reviewed: I have personally reviewed following labs and imaging studies  CBC: Recent Labs  Lab 02/10/19 0020 02/10/19 0841  WBC 3.0* 3.3*  NEUTROABS 2.2  --   HGB 13.9 13.1  HCT 42.5 40.6  MCV 88.4 89.8  PLT 220 [id number]   Basic Metabolic Panel: Recent Labs  Lab 02/10/19 0020 02/10/19 0841 02/11/19 0623 02/12/19 0815  NA 137  --  139 139  K 3.6  --  4.2 4.3  CL 100  --  109 106  CO2 24  --  20* 23  GLUCOSE 105*  --  119* 122*  BUN 17  --  16 21*  CREATININE 1.24* 1.00 0.74 0.79  CALCIUM 8.6*  --  8.4* 8.6*   GFR: Estimated Creatinine Clearance: 84.8 mL/min (by C-G formula based on SCr of 0.79 mg/dL).   Liver Function Tests: Recent Labs  Lab 02/10/19 0020 02/11/19 [id number] 02/12/19 0815  AST 57* 29 21  ALT 58* 42 33  ALKPHOS 333* 259* 225*  BILITOT 0.4 0.7 0.3  PROT 7.3 6.1* 6.3*  ALBUMIN 3.7 3.0* 3.2*   Coagulation Profile: Recent Labs  Lab 02/10/19 0020  INR 1.1   Anemia Panel: Recent Labs    02/11/19 0623 02/12/19 0815  FERRITIN 497* 434*   Urine analysis:    Component Value Date/Time   COLORURINE YELLOW 02/11/2019 0025   APPEARANCEUR CLEAR 02/11/2019 0025   LABSPEC 1.020 02/11/2019 0025   PHURINE 6.0 02/11/2019 0025   GLUCOSEU NEGATIVE 02/11/2019 0025   HGBUR NEGATIVE 02/11/2019 0025   BILIRUBINUR NEGATIVE 02/11/2019 0025   KETONESUR 20 (A) 02/11/2019 0025   PROTEINUR NEGATIVE 02/11/2019 0025   UROBILINOGEN 0.2 04/08/2007 0620   NITRITE NEGATIVE 02/11/2019 0025   LEUKOCYTESUR NEGATIVE 02/11/2019 0025    Recent Results (from the past 240 hour(s))  Culture, blood (routine x 2)     Status: None (Preliminary result)   Collection Time: 02/10/19 12:49 AM   Specimen: BLOOD  Result  Value Ref Range Status   Specimen Description BLOOD RIGHT ANTECUBITAL  Final   Special Requests   Final    BOTTLES DRAWN AEROBIC AND ANAEROBIC Blood Culture adequate volume   Culture   Final    NO GROWTH 2 DAYS Performed at [facility], [address], [place], [place] [address]    Report Status PENDING  Incomplete  Culture, blood (routine x 2)     Status: None (Preliminary result)   Collection Time: 02/10/19 12:50 AM   Specimen: BLOOD  Result Value Ref Range Status   Specimen Description BLOOD RIGHT ANTECUBITAL  Final   Special Requests   Final    BOTTLES DRAWN AEROBIC AND ANAEROBIC Blood Culture results may not be optimal due to an inadequate volume of blood received in culture bottles   Culture   Final    NO GROWTH 2 DAYS Performed at [facility], [address] [place], [place] [address]    Report Status PENDING  Incomplete  SARS Coronavirus 2 by RT PCR (hospital order, performed in [place] hospital lab)     Status: Abnormal   Collection Time: 02/10/19  3:00 AM  Result Value Ref Range Status   SARS Coronavirus 2 POSITIVE (A) NEGATIVE Final    Comment: RESULT CALLED TO, READ BACK BY AND VERIFIED WITH: C TURNER,RN @0413  02/10/19 MKELLY (NOTE) SARS-CoV-2 target nucleic acids are DETECTED SARS-CoV-2 RNA is generally detectable in upper respiratory specimens  during the acute phase of infection.  Positive results are indicative  of the presence of the identified virus, but do not rule out bacterial infection or co-infection with other pathogens not detected by the test.  Clinical correlation with patient history and  other diagnostic information is necessary to determine patient infection status.  The expected result is negative. Fact Sheet for Patients:   StrictlyIdeas.no  Fact Sheet for Healthcare Providers:   BankingDealers.co.za   This test is not yet approved or cleared by the [place] FDA and  has been authorized  for detection and/or diagnosis of SARS-CoV-2 by FDA under an Emergency Use Authorization (EUA).  This EUA will remain in effect (meaning this test can be  used) for the duration of  the COVID-19 declaration under Section 564(b)(1) of the Act, 21 U.S.C. section 360-bbb-3(b)(1), unless the authorization is terminated or revoked sooner. Performed at [facility], [address] [place], [place] [address]   Urine culture     Status: None  Collection Time: 02/11/19 12:25 AM   Specimen: Urine, Clean Catch  Result Value Ref Range Status   Specimen Description   Final    URINE, CLEAN CATCH Performed at [facility], [address], [place], [place] [address]    Special Requests   Final    NONE Performed at [facility], [address] [place], [place] [address]    Culture   Final    NO GROWTH Performed at [facility], [place] [address] [place], [place] [address]    Report Status 02/12/2019 FINAL  Final     Radiology Studies: No results found.  Scheduled Meds: . aspirin  81 mg Oral Daily  . [START ON 02/13/2019] azithromycin  500 mg Oral Daily  . cholecalciferol  2,000 Units Oral Daily  . dexamethasone (DECADRON) injection  6 mg Intravenous Q24H  . enoxaparin (LOVENOX) injection  40 mg Subcutaneous Q24H  . gabapentin  300 mg Oral Daily  . insulin aspart  0-20 Units Subcutaneous TID WC  . sodium chloride flush  3 mL Intravenous Q12H  . vitamin C  500 mg Oral Daily  . zinc sulfate  220 mg Oral Daily   Continuous Infusions: . sodium chloride    . remdesivir 100 mg in NS 100 mL 100 mg (02/12/19 0837)     LOS: 2 days    Time spent: 30 minutes    Barton Dubois, MD [facility] Pager [phone]   02/12/2019, 5:10 PM

## 2019-02-12 NOTE — Progress Notes (Signed)
Patient is resting in bedside chair with no complaints other than cough "every now and then". Patient has washed up this afternoon and states that she is feeling better. Will continue to monitor.

## 2019-02-13 DIAGNOSIS — E6609 Other obesity due to excess calories: Secondary | ICD-10-CM

## 2019-02-13 DIAGNOSIS — K219 Gastro-esophageal reflux disease without esophagitis: Secondary | ICD-10-CM

## 2019-02-13 DIAGNOSIS — R7303 Prediabetes: Secondary | ICD-10-CM

## 2019-02-13 DIAGNOSIS — Z6836 Body mass index (BMI) 36.0-36.9, adult: Secondary | ICD-10-CM

## 2019-02-13 DIAGNOSIS — R7401 Elevation of levels of liver transaminase levels: Secondary | ICD-10-CM

## 2019-02-13 LAB — COMPREHENSIVE METABOLIC PANEL
ALT: 30 U/L (ref 0–44)
AST: 20 U/L (ref 15–41)
Albumin: 3.1 g/dL — ABNORMAL LOW (ref 3.5–5.0)
Alkaline Phosphatase: 190 U/L — ABNORMAL HIGH (ref 38–126)
Anion gap: 10 (ref 5–15)
BUN: 20 mg/dL (ref 6–20)
CO2: 23 mmol/L (ref 22–32)
Calcium: 8.5 mg/dL — ABNORMAL LOW (ref 8.9–10.3)
Chloride: 106 mmol/L (ref 98–111)
Creatinine, Ser: 0.73 mg/dL (ref 0.44–1.00)
GFR calc Af Amer: >60 mL/min (ref >60)
GFR calc non Af Amer: >60 mL/min (ref >60)
Glucose, Bld: 105 mg/dL — ABNORMAL HIGH (ref 70–99)
Potassium: 3.8 mmol/L (ref 3.5–5.1)
Sodium: 139 mmol/L (ref 135–145)
Total Bilirubin: 0.3 mg/dL (ref 0.3–1.2)
Total Protein: 6.1 g/dL — ABNORMAL LOW (ref 6.5–8.1)

## 2019-02-13 LAB — FERRITIN: Ferritin: 393 ng/mL — ABNORMAL HIGH (ref 11–307)

## 2019-02-13 LAB — D-DIMER, QUANTITATIVE: D-Dimer, Quant: 0.41 ug/mL-FEU (ref 0.00–0.50)

## 2019-02-13 LAB — GLUCOSE, CAPILLARY
Glucose-Capillary: 89 mg/dL (ref 70–99)
Glucose-Capillary: 189 mg/dL — ABNORMAL HIGH (ref 70–99)

## 2019-02-13 LAB — C-REACTIVE PROTEIN: CRP: 1.1 mg/dL — ABNORMAL HIGH (ref <1.0)

## 2019-02-13 MED ORDER — PANTOPRAZOLE SODIUM 40 MG PO TBEC: 40.0000 mg | DELAYED_RELEASE_TABLET | Freq: Every day | ORAL | 0 refills | Status: DC

## 2019-02-13 MED ORDER — ZINC SULFATE 220 (50 ZN) MG PO CAPS: 220.0000 mg | ORAL_CAPSULE | Freq: Every day | ORAL | 0 refills | Status: AC

## 2019-02-13 MED ORDER — ASCORBIC ACID 500 MG PO TABS: 500.0000 mg | ORAL_TABLET | Freq: Every day | ORAL | 0 refills | Status: AC

## 2019-02-13 MED ORDER — AZITHROMYCIN 250 MG PO TABS: ORAL_TABLET | ORAL | 0 refills | Status: DC

## 2019-02-13 MED ORDER — CALCIUM CARBONATE ANTACID 500 MG PO CHEW
1.0000 | CHEWABLE_TABLET | Freq: Two times a day (BID) | ORAL | Status: DC
  Administered 2019-02-13: 200 mg via ORAL
  Filled 2019-02-13: qty 1

## 2019-02-13 MED ORDER — DEXAMETHASONE 6 MG PO TABS: 6.0000 mg | ORAL_TABLET | Freq: Every day | ORAL | 0 refills | Status: AC

## 2019-02-13 NOTE — Discharge Summary (Signed)
Physician Discharge Summary  Grace Moyer LDJ:[id number] DOB: Sep 04, 1960 DOA: 02/09/2019  PCP: Maisie Fus, MD  Admit date: 02/09/2019 Discharge date: 02/13/2019  Time spent: 35 minutes  Recommendations for Outpatient Follow-up:  1. Repeat complete metabolic panel to follow electrolytes, renal function and LFTs 2. Close monitoring of patient's CBGs with initiation of hypoglycemic regimen as required   Discharge Diagnoses:  Active Problems:   Pneumonia due to COVID-19 virus   Prediabetes   Transaminitis   Gastroesophageal reflux disease   Class 2 obesity due to excess calories with body mass index (BMI) of 36.0 to 36.9 in adult   Discharge Condition: Stable and improved.  Patient discharged home with instruction to follow-up with PCP in 10 days.  Diet recommendation: Low calorie/modified carbohydrate diet.  Filed Weights   02/09/19 2355 02/10/19 1221 02/13/19 0419  Weight: 95.3 kg 94.3 kg 94.2 kg    History of present illness:  As per H&P written by Dr. 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 Patient says that she was tested positive for Covid on 17 November and did a 14-day quarantine at home. Over past few days patient noted that she was having worsening shortness of breath. Also reported the 2-day history of urinary frequency and dysuria with frequent history of UTIs. Her PCP put her on Bactrim to cover for UTI as well as for cough and congestion. Tonight her breathing difficulty but worse so patient came to the ED for further evaluation. She also has a history of bilateral PE after she had 2 surgeries for breast cancer. Patient is not on anticoagulation.  In the ED CTA chest was done which was negative for pulmonary embolism but showed multifocal pneumonia consistent with COVID-19 pneumonia. Patient was started on ceftriaxone and Zithromax empirically for  community-acquired pneumonia. She had temperature f 103.4.O2 sat 97% on 2 L/min of nasal cannula  She denies nausea vomiting or diarrhea. Denies chest pain Admits to coughing up clear phlegm.  Hospital Course:  Acute respiratory failure with hypoxia in the setting of COVID-19 pneumonia -Patient requiring 2 L nasal cannula supplementation on presentation; good oxygen saturation on room air at time of discharge. -Chest x-ray demonstrating bilateral infiltrates -Covid test positive. -Inflammatory markers have appropriately trended down -Patient instructed to maintain adequate hydration continue use as needed Tylenol for comfort and fever. -Will continue the use of incentive spirometer -Patient has completed 4 days of remdesivir IV at discharge will continue several more days of oral steroids to complete management. -continue vit C and Zinc. -will complete empiric tx with erythromycin for bronchitis component.   Mild transaminitis -Hepatic steatosis without signs of cholecystitis or cirrhosis appreciated on ultrasound (study was limited secondary to body habitus). -Continue to follow LFTs trend follow-up visit -Discharge AST ALT within normal limits and just mild elevation of alk phos.  Class II obesity -Body mass index is 36.83 kg/m. -Low calorie diet, portion control and increase physical activity discussed with patient.  Acute kidney injury -Creatinine on presentation 1.24 -Baseline 0.89 -After fluid resuscitation; renal function is back to normal. -Continue to minimize/avoid nephrotoxic agents -Repeat basic metabolic panel follow-up visit to reassess electrolytes and renal function.  Hyperglycemia -No prior history of diabetes -A1c 6.1 -Patient meeting prediabetes criteria. -Current steroid usage increasing CBG's as well. -Lifestyle changes has been recommended; close follow-up as an outpatient to determine the need of initiation of hypoglycemic  regimen.  GERD -Discharged on Protonix -  Lifestyle changes and weight loss recommended.  Procedures:  See below for x-ray reports.  Consultations:  None  Discharge Exam: Vitals:   02/12/19 2300 02/13/19 0459  BP: 108/66 123/72  Pulse: 67 (!) 57  Resp: 17 17  Temp: 97.9 F (36.6 C) 98.5 F (36.9 C)  SpO2: 97% 94%    General: Afebrile, no chest pain, no nausea, no vomiting.  Good oxygen saturation on room air and is present feeling stable and ready to go home.  Patient is speaking in full sentences and with normal respiratory effort. Cardiovascular: S1-S2, no rubs, no gallops Respiratory: Scattered rhonchi, no wheezing, no using accessory muscles; good O2 sat on room air Abdomen: Soft, nontender, distended, positive bowel sounds Extremities: No cyanosis, no clubbing.  Discharge Instructions   Discharge Instructions    Diet Carb Modified   Complete by: As directed    Discharge instructions   Complete by: As directed    Take medications as prescribed Keep yourself well hydrated Follow up with PCP in 10 days.   Increase activity slowly   Complete by: As directed    MyChart COVID-19 home monitoring program   Complete by: Feb 13, 2019    Is the patient willing to use the [place] for home monitoring?: Yes   Temperature monitoring   Complete by: Feb 13, 2019    After how many days would you like to receive a notification of this patient's flowsheet entries?: 1     Allergies as of 02/13/2019      Reactions   Hydrocodone Other (See Comments)   Did not help with pain and kept her up all night      Medication List    TAKE these medications   ascorbic acid 500 MG tablet Commonly known as: VITAMIN C Take 1 tablet (500 mg total) by mouth daily. Start taking on: February 14, 2019   aspirin 81 MG tablet Take 1 tablet (81 mg total) by mouth daily.   azithromycin 250 MG tablet Commonly known as: ZITHROMAX Bronchitis/bronchiectaiss. Start taking on:  February 14, 2019   dexamethasone 6 MG tablet Commonly known as: DECADRON Take 1 tablet (6 mg total) by mouth daily for 7 days.   gabapentin 100 MG capsule Commonly known as: Neurontin Take 1 capsule (100 mg total) by mouth 3 (three) times daily. What changed:   how much to take  when to take this   pantoprazole 40 MG tablet Commonly known as: Protonix Take 1 tablet (40 mg total) by mouth daily.   Vitamin D-1000 Max St 25 MCG (1000 UT) tablet Generic drug: Cholecalciferol Take 2 tablets by mouth daily.   zinc sulfate 220 (50 Zn) MG capsule Take 1 capsule (220 mg total) by mouth daily. Start taking on: February 14, 2019      Allergies  Allergen Reactions  . Hydrocodone Other (See Comments)    Did not help with pain and kept her up all night   Follow-up Information    Maisie Fus, MD. Schedule an appointment as soon as possible for a visit in 10 day(s).   Specialty: Obstetrics and Gynecology Contact information: [place] [address] [phone]           The results of significant diagnostics from this hospitalization (including imaging, microbiology, ancillary and laboratory) are listed below for reference.    Significant Diagnostic Studies: CT ANGIO CHEST PE W OR WO CONTRAST  Result Date: 02/10/2019 CLINICAL DATA:  Shortness of  breath. Recent history of COVID-19. EXAM: CT ANGIOGRAPHY CHEST WITH CONTRAST TECHNIQUE: Multidetector CT imaging of the chest was performed using the standard protocol during bolus administration of intravenous contrast. Multiplanar CT image reconstructions and MIPs were obtained to evaluate the vascular anatomy. CONTRAST:  179m OMNIPAQUE IOHEXOL 350 MG/ML SOLN COMPARISON:  Chest radiograph earlier this day. Chest CTA 08/15/2016. FINDINGS: Cardiovascular: There are no filling defects within the pulmonary arteries to suggest pulmonary embolus. Evaluation is diagnostic to the segmental level. The AV is  question bilateral lower lobe segmental pulmonary emboli are no longer seen. No aortic dissection. Heart is normal in size. No pericardial effusion. Mediastinum/Nodes: No enlarged mediastinal or hilar lymph nodes. No axillary adenopathy. No suspicious thyroid nodule. Slightly patulous esophagus without wall thickening. Tiny hiatal hernia. Lungs/Pleura: Bilateral patchy ground-glass opacities throughout both lungs. There scattered air bronchograms and slightly consolidative opacities, particularly in the lower lobes. Pleural effusion. No pulmonary edema. The trachea and mainstem bronchi are patent. Upper Abdomen: No acute abnormality. Musculoskeletal: There are no acute or suspicious osseous abnormalities. Left axillary surgical clips. Review of the MIP images confirms the above findings. IMPRESSION: 1. No pulmonary embolus. 2. Bilateral patchy ground-glass and consolidative opacities throughout both lungs, pattern consistent with COVID-19 pneumonia. Electronically Signed   By: MKeith RakeM.D.   On: 02/10/2019 03:50   Abdominal ultrasound  Result Date: 02/10/2019 CLINICAL DATA:  Elevated liver enzymes, shortness of breath EXAM: ABDOMEN ULTRASOUND COMPLETE COMPARISON:  CT angiography of the chest dated 02/10/2019 FINDINGS: Gallbladder: No gallstones or wall thickening visualized. No sonographic Murphy sign noted by sonographer. Common bile duct: Diameter: 3 mm Liver: Liver with very limited assessment. This is due to patient body habitus. Echogenicity appears increased. Portal vein is patent on color Doppler imaging with normal direction of blood flow towards the liver. IVC: No abnormality visualized. Pancreas: Not seen due to overlying bowel gas. Spleen: Size and appearance within normal limits. Right Kidney: Length: 11.3 cm. Limited assessment without signs of hydronephrosis or visible calculus. Left Kidney: Length: 12 cm. Limited assessment, no signs of hydronephrosis or visible calculus. Abdominal  aorta: Assessment limited secondary to overlying bowel gas. Mid aortic caliber proximally 2 cm. Other findings: Study limited by overlying bowel gas. IMPRESSION: 1. Limited study due to patient body habitus. 2. Hepatic steatosis without signs of cholecystitis. 3. Pancreas and proximal aorta not well seen due to bowel gas.  Are Electronically Signed   By: GZetta BillsM.D.   On: 02/10/2019 09:22   DG Chest Port 1 View  Result Date: 02/10/2019 CLINICAL DATA:  Shortness of breath.  COVID-19 positive. EXAM: PORTABLE CHEST 1 VIEW COMPARISON:  08/15/2016 FINDINGS: Multifocal bilateral airspace opacity. Normal cardiomediastinal contours. No pleural effusion or pneumothorax. IMPRESSION: Multifocal airspace opacities, consistent with multifocal infection. Followup PA and lateral chest X-ray is recommended in 3-4 weeks following trial of antibiotic therapy to ensure resolution and exclude underlying malignancy. Electronically Signed   By: KUlyses JarredM.D.   On: 02/10/2019 01:11    Microbiology: Recent Results (from the past 240 hour(s))  Culture, blood (routine x 2)     Status: None (Preliminary result)   Collection Time: 02/10/19 12:49 AM   Specimen: BLOOD  Result Value Ref Range Status   Specimen Description BLOOD RIGHT ANTECUBITAL  Final   Special Requests   Final    BOTTLES DRAWN AEROBIC AND ANAEROBIC Blood Culture adequate volume   Culture   Final    NO GROWTH 2 DAYS [facility]  [address] [place], [place] [address]    Report Status PENDING  Incomplete  Culture, blood (routine x 2)     Status: None (Preliminary result)   Collection Time: 02/10/19 12:50 AM   Specimen: BLOOD  Result Value Ref Range Status   Specimen Description BLOOD RIGHT ANTECUBITAL  Final   Special Requests   Final    BOTTLES DRAWN AEROBIC AND ANAEROBIC Blood Culture results may not be optimal due to an inadequate volume of blood received in culture bottles   Culture   Final    NO GROWTH 2  DAYS Performed at [facility], [address], [place], [place] [address]    Report Status PENDING  Incomplete  SARS Coronavirus 2 by RT PCR (hospital order, performed in [place] hospital lab)     Status: Abnormal   Collection Time: 02/10/19  3:00 AM  Result Value Ref Range Status   SARS Coronavirus 2 POSITIVE (A) NEGATIVE Final    Comment: RESULT CALLED TO, READ BACK BY AND VERIFIED WITH: C TURNER,RN _0  02/10/19 MKELLY (NOTE) SARS-CoV-2 target nucleic acids are DETECTED SARS-CoV-2 RNA is generally detectable in upper respiratory specimens  during the acute phase of infection.  Positive results are indicative  of the presence of the identified virus, but do not rule out bacterial infection or co-infection with other pathogens not detected by the test.  Clinical correlation with patient history and  other diagnostic information is necessary to determine patient infection status.  The expected result is negative. Fact Sheet for Patients:   StrictlyIdeas.no  Fact Sheet for Healthcare Providers:   BankingDealers.co.za   This test is not yet approved or cleared by the [place] FDA and  has been authorized for detection and/or diagnosis of SARS-CoV-2 by FDA under an Emergency Use Authorization (EUA).  This EUA will remain in effect (meaning this test can be  used) for the duration of  the COVID-19 declaration under Section 564(b)(1) of the Act, 21 U.S.C. section 360-bbb-3(b)(1), unless the authorization is terminated or revoked sooner. Performed at [facility], [address], [place], [place] [address]   Urine culture     Status: None   Collection Time: 02/11/19 12:25 AM   Specimen: Urine, Clean Catch  Result Value Ref Range Status   Specimen Description   Final    URINE, CLEAN CATCH Performed at [facility], [address], [place], [place] [address]    Special Requests   Final    NONE Performed at [facility],  [address] [place], [place] [address]    Culture   Final    NO GROWTH Performed at [facility], [address], [place], [place] [address]    Report Status 02/12/2019 FINAL  Final     Labs: Basic Metabolic Panel: Recent Labs  Lab 02/10/19 0020 02/10/19 0841 02/11/19 0623 02/12/19 0815 02/13/19 0733  NA 137  --  139 139 139  K 3.6  --  4.2 4.3 3.8  CL 100  --  109 106 106  CO2 24  --  20* 23 23  GLUCOSE 105*  --  119* 122* 105*  BUN 17  --  16 21* 20  CREATININE 1.24* 1.00 0.74 0.79 0.73  CALCIUM 8.6*  --  8.4* 8.6* 8.5*   Liver Function Tests: Recent Labs  Lab 02/10/19 0020 02/11/19 0623 02/12/19 0815 02/13/19 0733  AST 57* _1 ALT 58* 42 33 30  ALKPHOS 333* 259* 225* 190*  BILITOT  0.4 0.7 0.3 0.3  PROT 7.3 6.1* 6.3* 6.1*  ALBUMIN 3.7 3.0* 3.2* 3.1*   CBC: Recent Labs  Lab 02/10/19 0020 02/10/19 0841  WBC 3.0* 3.3*  NEUTROABS 2.2  --   HGB 13.9 13.1  HCT 42.5 40.6  MCV 88.4 89.8  PLT 220 199   CBG: Recent Labs  Lab 02/12/19 1235 02/12/19 1733 02/12/19 2128 02/13/19 0843 02/13/19 1205  GLUCAP 143* 136* 134* 89 189*    Signed:  Barton Dubois MD.  [facility] 02/13/2019, 1:52 PM

## 2019-02-13 NOTE — Progress Notes (Signed)
Nsg Discharge Note  Admit Date:  02/09/2019 Discharge date: 02/13/2019   Marye Dicostanzo to be D/C'd home per MD order.  AVS completed.  Copy for chart, and copy for patient signed, and dated. Patient/caregiver able to verbalize understanding.  Discharge Medication: Allergies as of 02/13/2019      Reactions   Hydrocodone Other (See Comments)   Did not help with pain and kept her up all night      Medication List    TAKE these medications   ascorbic acid 500 MG tablet Commonly known as: VITAMIN C Take 1 tablet (500 mg total) by mouth daily. Start taking on: February 14, 2019   aspirin 81 MG tablet Take 1 tablet (81 mg total) by mouth daily.   azithromycin 250 MG tablet Commonly known as: ZITHROMAX Bronchitis/bronchiectaiss. Start taking on: February 14, 2019   dexamethasone 6 MG tablet Commonly known as: DECADRON Take 1 tablet (6 mg total) by mouth daily for 7 days.   gabapentin 100 MG capsule Commonly known as: Neurontin Take 1 capsule (100 mg total) by mouth 3 (three) times daily. What changed:   how much to take  when to take this   pantoprazole 40 MG tablet Commonly known as: Protonix Take 1 tablet (40 mg total) by mouth daily.   Vitamin D-1000 Max St 25 MCG (1000 UT) tablet Generic drug: Cholecalciferol Take 2 tablets by mouth daily.   zinc sulfate 220 (50 Zn) MG capsule Take 1 capsule (220 mg total) by mouth daily. Start taking on: February 14, 2019       Discharge Assessment: Vitals:   02/13/19 0459 02/13/19 1437  BP: 123/72 102/70  Pulse: (!) 57 63  Resp: 17 18  Temp: 98.5 F (36.9 C) 98 F (36.7 C)  SpO2: 94% 97%   Skin clean, dry and intact without evidence of skin break down, no evidence of skin tears noted. IV catheter discontinued intact. Site without signs and symptoms of complications - no redness or edema noted at insertion site, patient denies c/o pain - only slight tenderness at site.  Dressing with slight pressure  applied.  D/c Instructions-Education: Discharge instructions given to patient/family with verbalized understanding. D/c education completed with patient/family including follow up instructions, medication list, d/c activities limitations if indicated, with other d/c instructions as indicated by MD - patient able to verbalize understanding, all questions fully answered. Patient instructed to return to ED, call 911, or call MD for any changes in condition.  Patient escorted via [place], and D/C home via private auto.  Venita Sheffield, RN 02/13/2019 4:08 PM

## 2019-02-15 ENCOUNTER — Encounter: Payer: Self-pay | Admitting: *Deleted

## 2019-02-15 ENCOUNTER — Encounter: Payer: Self-pay | Admitting: Hematology

## 2019-02-15 LAB — CULTURE, BLOOD (ROUTINE X 2)
Culture: NO GROWTH
Culture: NO GROWTH
Special Requests: ADEQUATE

## 2019-02-23 ENCOUNTER — Other Ambulatory Visit: Payer: Self-pay

## 2019-02-23 ENCOUNTER — Ambulatory Visit: Payer: BC Managed Care – PPO | Attending: Internal Medicine

## 2019-02-23 DIAGNOSIS — G4709 Other insomnia: Secondary | ICD-10-CM | POA: Diagnosis not present

## 2019-02-23 DIAGNOSIS — Z20822 Contact with and (suspected) exposure to covid-19: Secondary | ICD-10-CM

## 2019-02-23 DIAGNOSIS — Z20828 Contact with and (suspected) exposure to other viral communicable diseases: Secondary | ICD-10-CM | POA: Diagnosis not present

## 2019-02-23 DIAGNOSIS — U071 COVID-19: Secondary | ICD-10-CM | POA: Diagnosis not present

## 2019-02-24 LAB — NOVEL CORONAVIRUS, NAA: SARS-CoV-2, NAA: NOT DETECTED

## 2019-02-27 ENCOUNTER — Emergency Department (HOSPITAL_COMMUNITY): Payer: BC Managed Care – PPO

## 2019-02-27 ENCOUNTER — Encounter (HOSPITAL_COMMUNITY): Payer: Self-pay | Admitting: Emergency Medicine

## 2019-02-27 ENCOUNTER — Other Ambulatory Visit: Payer: Self-pay

## 2019-02-27 ENCOUNTER — Emergency Department (HOSPITAL_COMMUNITY)
Admission: EM | Admit: 2019-02-27 | Discharge: 2019-02-27 | Disposition: A | Discharge status: Home / Self Care | Payer: BC Managed Care – PPO | Attending: Emergency Medicine | Admitting: Emergency Medicine

## 2019-02-27 DIAGNOSIS — R0602 Shortness of breath: Secondary | ICD-10-CM | POA: Insufficient documentation

## 2019-02-27 DIAGNOSIS — Z853 Personal history of malignant neoplasm of breast: Secondary | ICD-10-CM | POA: Insufficient documentation

## 2019-02-27 DIAGNOSIS — Z87891 Personal history of nicotine dependence: Secondary | ICD-10-CM | POA: Insufficient documentation

## 2019-02-27 DIAGNOSIS — U071 COVID-19: Secondary | ICD-10-CM | POA: Diagnosis not present

## 2019-02-27 DIAGNOSIS — Z7982 Long term (current) use of aspirin: Secondary | ICD-10-CM | POA: Diagnosis not present

## 2019-02-27 DIAGNOSIS — J1282 Pneumonia due to coronavirus disease 2019: Secondary | ICD-10-CM

## 2019-02-27 DIAGNOSIS — J1289 Other viral pneumonia: Secondary | ICD-10-CM | POA: Diagnosis not present

## 2019-02-27 LAB — BASIC METABOLIC PANEL
Anion gap: 10 (ref 5–15)
BUN: 11 mg/dL (ref 6–20)
CO2: 22 mmol/L (ref 22–32)
Calcium: 8.2 mg/dL — ABNORMAL LOW (ref 8.9–10.3)
Chloride: 106 mmol/L (ref 98–111)
Creatinine, Ser: 0.83 mg/dL (ref 0.44–1.00)
GFR calc Af Amer: >60 mL/min (ref >60)
GFR calc non Af Amer: >60 mL/min (ref >60)
Glucose, Bld: 107 mg/dL — ABNORMAL HIGH (ref 70–99)
Potassium: 4.7 mmol/L (ref 3.5–5.1)
Sodium: 138 mmol/L (ref 135–145)

## 2019-02-27 LAB — TROPONIN I (HIGH SENSITIVITY): Troponin I (High Sensitivity): 3 ng/L (ref <18)

## 2019-02-27 LAB — BRAIN NATRIURETIC PEPTIDE: B Natriuretic Peptide: 101 pg/mL — ABNORMAL HIGH (ref 0.0–100.0)

## 2019-02-27 LAB — LACTIC ACID, PLASMA: Lactic Acid, Venous: 1.9 mmol/L (ref 0.5–1.9)

## 2019-02-27 MED ORDER — IOHEXOL 350 MG/ML SOLN
100.0000 mL | Freq: Once | INTRAVENOUS | Status: AC | PRN
  Administered 2019-02-27: 100 mL via INTRAVENOUS

## 2019-02-27 MED ORDER — ALBUTEROL SULFATE HFA 108 (90 BASE) MCG/ACT IN AERS: 2.0000 | INHALATION_SPRAY | RESPIRATORY_TRACT | 0 refills | Status: DC | PRN

## 2019-02-27 MED ORDER — DOXYCYCLINE HYCLATE 100 MG PO CAPS: 100.0000 mg | ORAL_CAPSULE | Freq: Two times a day (BID) | ORAL | 0 refills | Status: DC

## 2019-02-27 MED ORDER — IBUPROFEN 800 MG PO TABS
800.0000 mg | ORAL_TABLET | Freq: Once | ORAL | Status: AC
  Administered 2019-02-27: 800 mg via ORAL
  Filled 2019-02-27: qty 1

## 2019-02-27 MED ORDER — LORAZEPAM 2 MG/ML IJ SOLN
0.5000 mg | Freq: Once | INTRAMUSCULAR | Status: AC
  Administered 2019-02-27: 0.5 mg via INTRAVENOUS
  Filled 2019-02-27: qty 1

## 2019-02-27 NOTE — ED Provider Notes (Signed)
This patient is a 58 year old female presenting after having Covid a couple of weeks ago.  She was admitted to the hospital with bilateral pneumonia, she immediately went home from the hospital and went back to work, she states that since that time she has had some shortness of breath which seems to be a little bit worse, that being said she has not been coughing that much, not having fevers, has been taking Tylenol as well.  She reports that when she is sitting or laying down she is in no distress and has no difficulty breathing but when she gets up and tries to walk she becomes more dyspneic.  On exam the patient speaks in full sentences, her respiratory rate is approximately 20, she has no peripheral edema, she does have scattered rales.  I have reviewed her lab work and her CT scan.  She has a normal troponin, normal BNP, normal metabolic panel and a lactic acid of 1.9.  The CT scan shows ongoing groundglass opacities consistent with bilateral pneumonia.  Her oxygen is 94%, pulse is around 70 and she is normotensive.  I will add an antibiotic at this point given her recurrent or persistent symptoms 2 weeks after discharge however she is well-appearing and I recommended another 2 weeks out of work.  She is agreeable to the plan  Final diagnoses:  SOB (shortness of breath)  Pneumonia due to COVID-19 virus      Noemi Chapel, MD 02/27/19 [phone]

## 2019-02-27 NOTE — ED Provider Notes (Signed)
[facility] EMERGENCY DEPARTMENT Provider Note   CSN: [id number] Arrival date & time: 02/27/19  [id number]     History Chief Complaint  Patient presents with  . Shortness of Breath    Grace Moyer is a 58 y.o. female.  Pt presents to the ED today with sob.  The pt was diagnosed as having Covid-19 on 11/19.  She was hospitalized for pna from 12/9 to 12/13.  Pt said she went back to work shortly after d/c.  She thinks she overdid it over the holidays.  Pt has finished her abx and steroids.  She was asleep on a recliner, then went upstairs to try to go to sleep in the bed.  She developed sudden sob.  The pt has continued to have intermittent fevers.  She took a tylenol around 0400.  The pt has continued to have a cough.  Pt has a pulse ox machine at home.  She said her O2 sat went down to 88%.        Past Medical History:  Diagnosis Date  . Anxiety   . Breast cancer ([place])   . Family history of breast cancer   . Family history of colon cancer   . Malignant neoplasm of upper-outer quadrant of left female breast ([place]) 01/31/2016  . 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 Personal history of chemotherapy   . Personal history of radiation therapy     Patient Active Problem List   Diagnosis Date Noted  . Prediabetes   . Transaminitis   . Gastroesophageal reflux disease   . Class 2 obesity due to excess calories with body mass index (BMI) of 36.0 to 36.9 in adult   . Pneumonia due to COVID-19 virus 02/10/2019  . Personal history of venous thrombosis and embolism 12/31/2016  . Peripheral neuropathy due to chemotherapy ([place]) 12/31/2016  . Acute pulmonary embolus ([place]) 08/15/2016  . Anemia due to antineoplastic chemotherapy 04/23/2016  . Genetic testing 04/03/2016  . Port catheter in place 03/26/2016  . Family history of breast cancer   . Family history of colon cancer   . 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 Malignant neoplasm of upper-outer quadrant of left  female breast ([place]) 01/31/2016  . Bilateral leg numbness 08/14/2015  . Neck pain on left side 08/14/2015  . Abnormal MRI of head 08/14/2015    Past Surgical History:  Procedure Laterality Date  . ABDOMINAL HYSTERECTOMY     partial  . BREAST BIOPSY Left 2017  . BREAST LUMPECTOMY Left    2018  . BREAST LUMPECTOMY WITH RADIOACTIVE SEED AND SENTINEL LYMPH NODE BIOPSY Left 07/31/2016   Procedure: LEFT BREAST RADIOACTIVE SEED GUIDED LUMPECTOMY WITH LEFT RADIOACTIVE SEED TARGETED AXILLARY SENTINEL LYMPH NODE EXCISION AND SENTINEL LYMPH NODE BIOPSY;  Surgeon: Rolm Bookbinder, MD;  Location: [facility];  Service: General;  Laterality: Left;  . BREAST REDUCTION SURGERY Bilateral 08/05/2016   Procedure: LEFT ONCOPLASTIC REDUCTION; RIGHT BREAST REDUCTION;  Surgeon: Irene Limbo, MD;  Location: [place];  Service: Plastics;  Laterality: Bilateral;  . EYE SURGERY    . PORT-A-CATH REMOVAL Right 07/31/2016   Procedure: REMOVAL PORT-A-CATH;  Surgeon: Rolm Bookbinder, MD;  Location: [facility];  Service: General;  Laterality: Right;  . PORTACATH PLACEMENT Right 02/11/2016   Procedure: INSERTION PORT-A-CATH WITH [place];  Surgeon: Rolm Bookbinder, MD;  Location: [facility];  Service: General;  Laterality: Right;  . REDUCTION MAMMAPLASTY       OB History  No obstetric history on file.     Family History  Problem Relation Age of Onset  . Breast cancer Maternal Grandmother        dx in her 38s  . CAD Paternal Grandmother   . Colon cancer Paternal Uncle        dx in his 35s  . COPD Maternal Grandfather   . CAD Paternal Grandfather   . Breast cancer Cousin        dx in her early to mid 1s; maternal first cousin  . Lung cancer Paternal Uncle     Social History   Tobacco Use  . Smoking status: Former Smoker    Types: Cigarettes    Quit date: 09/03/2001    Years since quitting: 17.4  . Smokeless tobacco: Never Used  Substance  Use Topics  . Alcohol use: Yes    Comment: social  . Drug use: No    Home Medications Prior to Admission medications   Medication Sig Start Date End Date Taking? Authorizing Provider  aspirin 81 MG tablet Take 1 tablet (81 mg total) by mouth daily. 03/30/17  Yes Magrinat, Virgie Dad, MD  Cholecalciferol (VITAMIN D-1000 MAX ST) 1000 units tablet Take 2 tablets by mouth daily.   Yes [provider]  vitamin C (VITAMIN C) 500 MG tablet Take 1 tablet (500 mg total) by mouth daily. 02/14/19  Yes Barton Dubois, MD  zinc sulfate 220 (50 Zn) MG capsule Take 1 capsule (220 mg total) by mouth daily. 02/14/19  Yes Barton Dubois, MD  azithromycin (ZITHROMAX) 250 MG tablet Bronchitis/bronchiectaiss. 02/14/19   Barton Dubois, MD  gabapentin (NEURONTIN) 100 MG capsule Take 1 capsule (100 mg total) by mouth 3 (three) times daily. Patient taking differently: Take 300 mg by mouth daily.  12/23/17   Truitt Merle, MD  pantoprazole (PROTONIX) 40 MG tablet Take 1 tablet (40 mg total) by mouth daily. 02/13/19 02/13/20  Barton Dubois, MD    Allergies    Hydrocodone  Review of Systems   Review of Systems  Constitutional: Positive for fatigue and fever.  Respiratory: Positive for cough and shortness of breath.   All other systems reviewed and are negative.   Physical Exam Updated Vital Signs BP 115/66 (BP Location: Left Arm)   Pulse 76   Temp 100 F (37.8 C) (Oral)   Resp (!) 30   Ht 5\' 3"  (1.6 m)   Wt 94.2 kg   SpO2 92%   BMI 36.79 kg/m   Physical Exam Vitals and nursing note reviewed.  Constitutional:      General: She is in acute distress.     Appearance: She is well-developed.  HENT:     Head: Normocephalic and atraumatic.     Mouth/Throat:     Mouth: Mucous membranes are moist.     Pharynx: Oropharynx is clear.  Eyes:     Extraocular Movements: Extraocular movements intact.     Pupils: Pupils are equal, round, and reactive to light.  Cardiovascular:     Rate and Rhythm:  Regular rhythm. Tachycardia present.  Pulmonary:     Effort: Tachypnea present.     Breath sounds: Normal breath sounds.  Abdominal:     General: Bowel sounds are normal.     Palpations: Abdomen is soft.  Musculoskeletal:        General: Normal range of motion.     Cervical back: Normal range of motion and neck supple.  Skin:    General: Skin is warm.  Capillary Refill: Capillary refill takes less than 2 seconds.  Neurological:     General: No focal deficit present.     Mental Status: She is alert and oriented to person, place, and time.  Psychiatric:        Mood and Affect: Mood normal.        Behavior: Behavior normal.     ED Results / Procedures / Treatments   Labs (all labs ordered are listed, but only abnormal results are displayed) Labs Reviewed  BASIC METABOLIC PANEL - Abnormal; Notable for the following components:      Result Value   Glucose, Bld 107 (*)    Calcium 8.2 (*)    All other components within normal limits  BRAIN NATRIURETIC PEPTIDE - Abnormal; Notable for the following components:   B Natriuretic Peptide 101.0 (*)    All other components within normal limits  CULTURE, BLOOD (ROUTINE X 2)  CULTURE, BLOOD (ROUTINE X 2)  LACTIC ACID, PLASMA  LACTIC ACID, PLASMA  CBC WITH DIFFERENTIAL/PLATELET  TROPONIN I (HIGH SENSITIVITY)  TROPONIN I (HIGH SENSITIVITY)    EKG EKG Interpretation  Date/Time:  Sunday February 27 2019 04:41:53 EST Ventricular Rate:  72 PR Interval:    QRS Duration: 102 QT Interval:  392 QTC Calculation: 429 R Axis:   51 Text Interpretation: Sinus rhythm Low voltage, precordial leads Borderline T abnormalities, anterior leads No significant change since last tracing Confirmed by Isla Pence [phone]) on 02/27/2019 4:47:40 AM   Radiology DG Chest Port 1 View  Result Date: 02/27/2019 CLINICAL DATA:  Shortness of breath EXAM: PORTABLE CHEST 1 VIEW COMPARISON:  02/10/2019 FINDINGS: The heart size and mediastinal contours are  within normal limits. Shallow lung inflation. Linear atelectasis within the right mid lung. Both lungs are clear. The visualized skeletal structures are unremarkable. IMPRESSION: Shallow lung inflation with right midlung atelectasis. Electronically Signed   By: Ulyses Jarred M.D.   On: 02/27/2019 05:36    Procedures Procedures (including critical care time)  Medications Ordered in ED Medications  ibuprofen (ADVIL) tablet 800 mg (800 mg Oral Given 02/27/19 0557)  LORazepam (ATIVAN) injection 0.5 mg (0.5 mg Intravenous Given 02/27/19 0556)  iohexol (OMNIPAQUE) 350 MG/ML injection 100 mL (100 mLs Intravenous Contrast Given 02/27/19 [id number])    ED Course  I have reviewed the triage vital signs and the nursing notes.  Pertinent labs & imaging results that were available during my care of the patient were reviewed by me and considered in my medical decision making (see chart for details).    MDM Rules/Calculators/A&P                      Pt is feeling much better.  CTA pending at shift change.  Pt signed out to Dr. Sabra Heck.  Ninive Obara was evaluated in Emergency Department on 02/27/2019 for the symptoms described in the history of present illness. She was evaluated in the context of the global COVID-19 pandemic, which necessitated consideration that the patient might be at risk for infection with the SARS-CoV-2 virus that causes COVID-19. Institutional protocols and algorithms that pertain to the evaluation of patients at risk for COVID-19 are in a state of rapid change based on information released by regulatory bodies including the CDC and federal and state organizations. These policies and algorithms were followed during the patient's care in the ED. Final Clinical Impression(s) / ED Diagnoses Final diagnoses:  SOB (shortness of breath)    Rx / DC Orders ED  Discharge Orders    None       Isla Pence, MD 02/27/19 [phone]

## 2019-02-27 NOTE — ED Triage Notes (Signed)
Pt with increased SOB since yesterday. Pt recently diagnosed with Double PNA  Since having Covid on last admission.

## 2019-02-27 NOTE — Discharge Instructions (Addendum)
Your x-ray shows that you have ongoing pneumonia.  This is likely because of Covid however, because you had a recurrence of symptoms 2 weeks after your admission we will add a medicine called doxycycline, take this twice a day for the next 10 days.  I also want you to take albuterol inhaler, 2 puffs every 4 hours as needed for shortness of breath.  Stay out of work for another 2 weeks however I do want you to return to the emergency department for increasing shortness of breath, fever, chest pain, difficulty breathing, vomiting or any other severe concerning or worsening symptoms.

## 2019-03-04 LAB — CULTURE, BLOOD (ROUTINE X 2)
Culture: NO GROWTH
Culture: NO GROWTH
Special Requests: ADEQUATE
Special Requests: ADEQUATE

## 2019-03-17 ENCOUNTER — Other Ambulatory Visit (INDEPENDENT_AMBULATORY_CARE_PROVIDER_SITE_OTHER): Payer: Self-pay | Admitting: Emergency Medicine

## 2019-03-18 DIAGNOSIS — U071 COVID-19: Secondary | ICD-10-CM | POA: Diagnosis not present

## 2019-03-22 ENCOUNTER — Encounter (HOSPITAL_COMMUNITY): Payer: Self-pay

## 2019-03-22 ENCOUNTER — Other Ambulatory Visit: Payer: Self-pay

## 2019-03-22 ENCOUNTER — Ambulatory Visit (HOSPITAL_COMMUNITY)
Admission: RE | Admit: 2019-03-22 | Discharge: 2019-03-22 | Disposition: A | Discharge status: Home / Self Care | Payer: BC Managed Care – PPO | Source: Ambulatory Visit | Attending: Family Medicine | Admitting: Family Medicine

## 2019-03-22 ENCOUNTER — Other Ambulatory Visit (HOSPITAL_COMMUNITY): Payer: Self-pay | Admitting: Family Medicine

## 2019-03-22 DIAGNOSIS — R0781 Pleurodynia: Secondary | ICD-10-CM | POA: Insufficient documentation

## 2019-03-22 DIAGNOSIS — R05 Cough: Secondary | ICD-10-CM | POA: Diagnosis not present

## 2019-03-22 DIAGNOSIS — U071 COVID-19: Secondary | ICD-10-CM | POA: Diagnosis not present

## 2019-03-22 DIAGNOSIS — Z681 Body mass index (BMI) 19 or less, adult: Secondary | ICD-10-CM | POA: Diagnosis not present

## 2019-03-22 DIAGNOSIS — J189 Pneumonia, unspecified organism: Secondary | ICD-10-CM

## 2019-03-22 DIAGNOSIS — R079 Chest pain, unspecified: Secondary | ICD-10-CM | POA: Diagnosis not present

## 2019-04-04 ENCOUNTER — Other Ambulatory Visit: Payer: BC Managed Care – PPO

## 2019-04-04 ENCOUNTER — Ambulatory Visit: Payer: BC Managed Care – PPO | Admitting: Hematology

## 2019-04-04 NOTE — Progress Notes (Signed)
Grace Moyer   [phone] [phone]) [phone]   Clinic Follow up Note   Patient Care Team: Sharilyn Sites, MD as PCP - General (Family Medicine) Collene Gobble, MD as Attending Physician (Psychiatry) Rolm Bookbinder, MD as Consulting Physician (General Surgery) Truitt Merle, MD as Consulting Physician (Hematology) Kyung Rudd, MD as Consulting Physician (Radiation Oncology) Delice Bison Charlestine Massed, NP as Nurse Practitioner (Hematology and Oncology)  Date of Service:  04/08/2019  CHIEF COMPLAINT: Follow up left breast triple negative cancer  SUMMARY OF ONCOLOGIC HISTORY: Oncology History Overview Note  Cancer Staging Malignant neoplasm of upper-outer quadrant of left female breast [facility]) Staging form: Breast, AJCC 7th Edition - Clinical stage from 01/29/2016: Stage IIB (T2, N1, M0) - Signed by Truitt Merle, MD on 02/06/2016 - Pathologic stage from 07/31/2016: T0, N0, cM0 - Signed by Truitt Merle, MD on 12/31/2016     Malignant neoplasm of upper-outer quadrant of left female breast ([place])  01/28/2016 Mammogram   Diagnostic MM and US showed a 2.9cm (3.2X2.2X2.5cm on [place]) mass in [place], multiple enlarged left axillary nodes, largest 2.5cm.    01/29/2016 Initial Diagnosis   Malignant neoplasm of upper-outer quadrant of left female breast ([place])   01/29/2016 Initial Biopsy   Left breast mass and axillary node biopsy showed IDC, G3,    01/29/2016 Receptors her2   ER-, PR-, HER2-, Ki67 85%   02/11/2016 Imaging   Bilateral breast MRI with and without contrast showed a 3.3 x 2.6 x 2.6 cm mass in the upper outer left breast, and left axillary lymphadenopathy, at least 3 abnormal lymph nodes, no other additional sites of concern.   02/12/2016 Imaging   CT chest, abdomen and pelvis with contrast showed a 2.4 x 2.7 cm lesion in the upper outer left breast, left axillary node metastasis measuring up to 1.3 cm, no evidence of distant metastasis.   02/12/2016 Imaging   Bone  scan was negative for skeletal metastasis.   02/13/2016 - 07/03/2016 Neo-Adjuvant Chemotherapy   Dose dense Adriamycin 60 mg/m, Cytoxan 600 mg/m, every 2 weeks, for 4 cycles, followed by weekly carboplatin and Taxol for 12 weeks    03/27/2016 Genetic Testing   Patient has genetic testing done for personal history of breast cancer, family history of cancer. ATM c.2606C>T VUS identified on the Breast/GYN panel.  Negative genetic testing for the MSH2 inversion analysis ([place] inversion). The Breast/GYN gene panel offered by [facility] includes sequencing and rearrangement analysis for the following 23 genes:  ATM, BARD1, BRCA1, BRCA2, BRIP1, CDH1, CHEK2, EPCAM, FANCC, MLH1, MSH2, MSH6, MUTYH, NBN, NF1, PALB2, PMS2, POLD1, PTEN, RAD51C, RAD51D, RECQL, and TP53.      07/11/2016 Imaging   Bilateral Breast MRI FINDINGS: Breast composition: c. Heterogeneous fibroglandular tissue.  Background parenchymal enhancement: Minimal.  Right breast: No mass or abnormal enhancement.  Left breast: No mass or abnormal enhancement. Biopsy clip is identified at the posterior left breast upper outer quadrant. The previously noted enhancing mass is not seen currently.  Lymph nodes: No abnormal appearing lymph nodes. Previously noted abnormal lymph nodes in the left axilla are currently normal size.  Ancillary findings:  None.  IMPRESSION: Known cancer.   07/31/2016 Pathology Results   Diagnosis 1. Breast, lumpectomy, Left - LOBULAR NEOPLASIA (ATYPICAL LOBULAR HYPERPLASIA). - FIBROCYSTIC CHANGES WITH ADENOSIS AND CALCIFICATIONS. - [place]. - SEE ONCOLOGY TABLE BELOW. 2. Breast, excision, Left additional Medial Margin - LOBULAR NEOPLASIA (ATYPICAL LOBULAR HYPERPLASIA). - FIBROCYSTIC CHANGES WITH ADENOSIS AND CALCIFICATIONS. - RADIAL SCAR. -  HEALING BIOPSY SITE. - SEE COMMENT. 3. Lymph node, sentinel, biopsy, Left - THERE IS NO EVIDENCE OF CARCINOMA IN 1 OF 1 LYMPH NODE (0/1) -  CHANGES CONSISTENT WITH PRIOR PROCEDURE. 4. Lymph node, sentinel, biopsy, Left - THERE IS NO EVIDENCE OF CARCINOMA IN 1 OF 1 LYMPH NODE (0/1) 5. Lymph node, sentinel, biopsy, Left - THERE IS NO EVIDENCE OF CARCINOMA IN 1 OF 1 LYMPH NODE (0/1) 6. Lymph node, sentinel, biopsy, Left - THERE IS NO EVIDENCE OF CARCINOMA IN 1 OF 1 LYMPH NODE (0/1) 7. Lymph node, sentinel, biopsy, Left - THERE IS NO EVIDENCE OF CARCINOMA IN 1 OF 1 LYMPH NODE (0/1)   07/31/2016 Surgery   LEFT BREAST RADIOACTIVE SEED GUIDED LUMPECTOMY WITH LEFT RADIOACTIVE SEED TARGETED AXILLARY SENTINEL LYMPH NODE EXCISION AND SENTINEL LYMPH NODE BIOPSY and port removal done by Dr. Donne Hazel.     08/05/2016 Pathology Results   Surgical pathology  Diagnosis 1. Breast, Mammoplasty, Left - SCLEROSING ADENOSIS WITH CALCIFICATIONS - FIBROCYSTIC CHANGES - DUCT ECTASIA - NO MALIGNANCY IDENTIFIED 2. Breast, Mammoplasty, Right - SCLEROSING ADENOSIS WITH CALCIFICATIONS - FIBROCYSTIC CHANGES - DUCT ECTASIA - NO MALIGNANCY IDENTIFIED   08/05/2016 Surgery   LEFT ONCOPLASTIC REDUCTION; RIGHT BREAST REDUCTION by Dr. Iran Planas    09/11/2016 - 10/27/2016 Radiation Therapy   Radiation with Dr. Lisbeth Renshaw  Radiation treatment dates:   09/11/2016 to 10/27/2016  Site/dose:    1. The Left breast was treated to 50.4 Gy in 28 fractions at 1.8 Gy per fraction. 2. The Left Sclav was treated to 50.4 Gy in 28 fractions at 1.8 Gy per fraction. 3. The Left breast was boosted to 10 Gy in 5 fractions at 2 Gy per fraction.   Beams/energy:    1. 3D // 10X, 15X 2. photon // 15X, 6X  Narrative: The patient tolerated radiation treatment relatively well.  She developed mild erythema within the treatment field without any moist desquamation. Using Radiaplex as directed and using neosporin under inframammary fold where skin has peeled.    01/19/2017 Mammogram   IMPRESSION: No mammographic evidence of malignancy, status post left lumpectomy and bilateral  reduction mammoplasty.      CURRENT THERAPY:  Surveillance  INTERVAL HISTORY:  Grace Moyer is here for a follow up of left breast cancer. She presents to the clinic alone. She notes she had COVID19 in 02/2019 and she was hospitalized due to PNA. She has recovered well. She has not been on home oxygen but did have spike fever and SOB once. She notes she completed Zpack by her PCP and still runs low grade fever around 99 each night. She denies night sweats. She denies abdominal issues, has normal BM and no LE edema.  Her neuropathy is still stable.     REVIEW OF SYSTEMS:   Constitutional: Denies chills or abnormal weight loss (+) Nightly low grade fever.  Eyes: Denies blurriness of vision Ears, nose, mouth, throat, and face: Denies mucositis or sore throat Respiratory: Denies cough, dyspnea or wheezes Cardiovascular: Denies palpitation, chest discomfort or lower extremity swelling Gastrointestinal:  Denies nausea, heartburn or change in bowel habits Skin: Denies abnormal skin rashes Lymphatics: Denies new lymphadenopathy or easy bruising Neurological: (+) neuropathy of hands stable.  Behavioral/Psych: Mood is stable, no new changes  All other systems were reviewed with the patient and are negative.  MEDICAL HISTORY:  Past Medical History:  Diagnosis Date  . Anxiety   . Breast cancer ([place])   . Family history of breast cancer   .  Family history of colon cancer   . Malignant neoplasm of upper-outer quadrant of left female breast ([place]) 01/31/2016  . Neuromyelitis optica ([place])   . Personal history of chemotherapy   . Personal history of radiation therapy     SURGICAL HISTORY: Past Surgical History:  Procedure Laterality Date  . ABDOMINAL HYSTERECTOMY     partial  . BREAST BIOPSY Left 2017  . BREAST LUMPECTOMY Left    2018  . BREAST LUMPECTOMY WITH RADIOACTIVE SEED AND SENTINEL LYMPH NODE BIOPSY Left 07/31/2016   Procedure: LEFT BREAST RADIOACTIVE SEED GUIDED LUMPECTOMY  WITH LEFT RADIOACTIVE SEED TARGETED AXILLARY SENTINEL LYMPH NODE EXCISION AND SENTINEL LYMPH NODE BIOPSY;  Surgeon: Rolm Bookbinder, MD;  Location: [facility];  Service: General;  Laterality: Left;  . BREAST REDUCTION SURGERY Bilateral 08/05/2016   Procedure: LEFT ONCOPLASTIC REDUCTION; RIGHT BREAST REDUCTION;  Surgeon: Irene Limbo, MD;  Location: [facility];  Service: Plastics;  Laterality: Bilateral;  . EYE SURGERY    . PORT-A-CATH REMOVAL Right 07/31/2016   Procedure: REMOVAL PORT-A-CATH;  Surgeon: Rolm Bookbinder, MD;  Location: [facility];  Service: General;  Laterality: Right;  . PORTACATH PLACEMENT Right 02/11/2016   Procedure: INSERTION PORT-A-CATH WITH [place];  Surgeon: Rolm Bookbinder, MD;  Location: [place];  Service: General;  Laterality: Right;  . REDUCTION MAMMAPLASTY      I have reviewed the social history and family history with the patient and they are unchanged from previous note.  ALLERGIES:  is allergic to hydrocodone.  MEDICATIONS:  Current Outpatient Medications  Medication Sig Dispense Refill  . albuterol (VENTOLIN HFA) 108 (90 Base) MCG/ACT inhaler Inhale 2 puffs into the lungs every 4 (four) hours as needed for wheezing or shortness of breath. 6.7 g 0  . aspirin 81 MG tablet Take 1 tablet (81 mg total) by mouth daily. 30 tablet   . benzonatate (TESSALON) 100 MG capsule Take 100 mg by mouth 3 (three) times daily.    . Cholecalciferol (VITAMIN D-1000 MAX ST) 1000 units tablet Take 5,000 Units by mouth daily.     Marland Kitchen doxycycline (VIBRAMYCIN) 100 MG capsule Take 1 capsule (100 mg total) by mouth 2 (two) times daily. 20 capsule 0  . vitamin C (VITAMIN C) 500 MG tablet Take 1 tablet (500 mg total) by mouth daily. 30 tablet 0  . zinc sulfate 220 (50 Zn) MG capsule Take 1 capsule (220 mg total) by mouth daily. (Patient taking differently: Take 50 mg by mouth daily. ) 30 capsule 0   No current  facility-administered medications for this visit.   Facility-Administered Medications Ordered in Other Visits  Medication Dose Route Frequency Provider Last Rate Last Admin  . heparin lock flush 100 unit/mL  500 Units Intravenous Once PRN Truitt Merle, MD      . sodium chloride flush (NS) 0.9 % injection 10 mL  10 mL Intracatheter PRN Truitt Merle, MD   10 mL at 06/18/16 1540  . sodium chloride flush (NS) 0.9 % injection 10 mL  10 mL Intravenous PRN Truitt Merle, MD      . sodium chloride flush (NS) 0.9 % injection 10 mL  10 mL Intravenous PRN Truitt Merle, MD   10 mL at 06/25/16 1106    PHYSICAL EXAMINATION: ECOG PERFORMANCE STATUS: 1 - Symptomatic but completely ambulatory  Vitals:   04/08/19 1436  BP: 131/75  Pulse: 85  Resp: 18  Temp: 98 F (36.7 C)  SpO2: 100%   Filed Weights  04/08/19 1436  Weight: 195 lb 14.4 oz (88.9 kg)    GENERAL:alert, no distress and comfortable SKIN: skin color, texture, turgor are normal, no rashes or significant lesions EYES: normal, Conjunctiva are pink and non-injected, sclera clear  NECK: supple, thyroid normal size, non-tender, without nodularity LYMPH:  no palpable lymphadenopathy in the cervical, axillary  LUNGS: clear to auscultation and percussion with normal breathing effort HEART: regular rate & rhythm and no murmurs and no lower extremity edema ABDOMEN:abdomen soft, non-tender and normal bowel sounds Musculoskeletal:no cyanosis of digits and no clubbing  NEURO: alert & oriented x 3 with fluent speech, no focal motor/sensory deficits BREAST: s/p left lumpectomy: Surgical incision healed well. No palpable mass, nodules or adenopathy bilaterally. Breast exam benign.   LABORATORY DATA:  I have reviewed the data as listed CBC Latest Ref Rng & Units 04/08/2019 02/10/2019 02/10/2019  WBC 4.0 - 10.5 K/uL 4.7 3.3(L) 3.0(L)  Hemoglobin 12.0 - 15.0 g/dL 11.5(L) 13.1 13.9  Hematocrit 36.0 - 46.0 % 36.3 40.6 42.5  Platelets 150 - 400 K/uL 241 199 220      CMP Latest Ref Rng & Units 04/08/2019 02/27/2019 02/13/2019  Glucose 70 - 99 mg/dL 89 107(H) 105(H)  BUN 6 - 20 mg/dL _0 Creatinine 0.44 - 1.00 mg/dL 0.79 0.83 0.73  Sodium 135 - 145 mmol/L 140 138 139  Potassium 3.5 - 5.1 mmol/L 3.6 4.7 3.8  Chloride 98 - 111 mmol/L 104 106 106  CO2 22 - 32 mmol/L _1 Calcium 8.9 - 10.3 mg/dL 9.2 8.2(L) 8.5(L)  Total Protein 6.5 - 8.1 g/dL 7.2 - 6.1(L)  Total Bilirubin 0.3 - 1.2 mg/dL 0.6 - 0.3  Alkaline Phos 38 - 126 U/L 174(H) - 190(H)  AST 15 - 41 U/L 18 - 20  ALT 0 - 44 U/L 22 - 30      RADIOGRAPHIC STUDIES: I have personally reviewed the radiological images as listed and agreed with the findings in the report. No results found.   ASSESSMENT & PLAN:  Grace Moyer is a 59 y.o. female with    1. Malignant neoplasm of upper-outer quadrant of left breast, invasive ductal carcinoma, G3, cT2N1M0, stage IIB, ER-/PR-/HER2-, ypT0N0 -She was diagnosed in 01/2016. She is s/p neo-adjuvant ddAC and CT, left breast lumpectomy and reconstruction, adjuvant radiation. -From a breast cancer standpoint, She is clinically doing well. Lab reviewed, her CBC and CMP are within normal limits except Hg 11.5, Alk Phos improving. Her physical exam was unremarkable. There is no clinical concern for recurrence.  -Continue Surveillance. She is overdue for mammogram due to [place]. She is set to have mammogram on 05/04/19.  -She is 3 years from her cancer diagnosis. Her risk of recurrence has decreased significantly. Continue 5 year surveillance.  -F/u in 6 months then yearly.    2. Left LE DVT and bilateral acute submassive PE, provoked by surgeries  -She developed extensive left lower extremity DVT with bilateral acute submassive PE, requiring hospitalization -This is probably provoked by her breast surgery andimmobility -She completed6 months ofXarelto in 01/2017 and then proceeded with baby aspirin once daily. Will continue.   3.  Genetics -03/27/16 testing showedATM c.2606C>T VUS identified on the Breast/Gyn panel.Otherwise Negative.  4.Neuromyelitis optica spectrum disorder (NOSD) -she will continue follow up with her neurologist Dr. Moshe Cipro at [facility]  -she is on rituximab every 4 weeksnowthroughour clinic since 03/21/16. Tolerated generally well,next due in Jan 2020 -Based on her insurance her copay is $140 at  our clinic and she has no copay at [facility]. She now has her monthly Rituxan at [facility]. That is understandable.  5. Recent COVID19 pneumonia  -Antibodies detected on 01/18/19 and she tested positive for COVID19 on 02/10/19.  -She required hospitalization due to double pneumonia. She has recovered mostly. She was evaluated again in ED on 02/27/19 due for SOB. CT showed persistent bilateral infiltrates   -She notes she has residual cough and SOB. She did lose 18 pounds. She notes since completing Zpack given by PCP she has been having low grade fever (around 39F) each night, no night sweats.   -I reviewed her recent chest scans in person with pt. I recommend she repeat in 2-3 months.    Plan -She is clinically doing well  -Mammogram on 05/04/19 -Lab and F/u in 6 months  -f/u with PCP for her respiratory symptoms     No problem-specific Assessment & Plan notes found for this encounter.   No orders of the defined types were placed in this encounter.  All questions were answered. The patient knows to call the clinic with any problems, questions or concerns. No barriers to learning was detected. The total time spent in the appointment was 30 minutes.     Truitt Merle, MD 04/08/2019   I, Joslyn Devon, am acting as scribe for Truitt Merle, MD.   I have reviewed the above documentation for accuracy and completeness, and I agree with the above.

## 2019-04-05 DIAGNOSIS — L819 Disorder of pigmentation, unspecified: Secondary | ICD-10-CM | POA: Diagnosis not present

## 2019-04-05 DIAGNOSIS — D1801 Hemangioma of skin and subcutaneous tissue: Secondary | ICD-10-CM | POA: Diagnosis not present

## 2019-04-05 DIAGNOSIS — D239 Other benign neoplasm of skin, unspecified: Secondary | ICD-10-CM | POA: Diagnosis not present

## 2019-04-08 ENCOUNTER — Telehealth: Payer: Self-pay | Admitting: Hematology

## 2019-04-08 ENCOUNTER — Inpatient Hospital Stay: Payer: BC Managed Care – PPO | Attending: Hematology

## 2019-04-08 ENCOUNTER — Inpatient Hospital Stay (HOSPITAL_BASED_OUTPATIENT_CLINIC_OR_DEPARTMENT_OTHER): Payer: BC Managed Care – PPO | Admitting: Hematology

## 2019-04-08 ENCOUNTER — Other Ambulatory Visit: Payer: Self-pay

## 2019-04-08 VITALS — BP 131/75 | HR 85 | Temp 98.0°F | Resp 18 | Ht 63.0 in | Wt 195.9 lb

## 2019-04-08 DIAGNOSIS — C50412 Malignant neoplasm of upper-outer quadrant of left female breast: Secondary | ICD-10-CM | POA: Diagnosis not present

## 2019-04-08 DIAGNOSIS — G360 Neuromyelitis optica [Devic]: Secondary | ICD-10-CM | POA: Insufficient documentation

## 2019-04-08 DIAGNOSIS — R05 Cough: Secondary | ICD-10-CM | POA: Insufficient documentation

## 2019-04-08 DIAGNOSIS — Z171 Estrogen receptor negative status [ER-]: Secondary | ICD-10-CM

## 2019-04-08 DIAGNOSIS — R509 Fever, unspecified: Secondary | ICD-10-CM | POA: Insufficient documentation

## 2019-04-08 DIAGNOSIS — R0602 Shortness of breath: Secondary | ICD-10-CM | POA: Insufficient documentation

## 2019-04-08 DIAGNOSIS — Z800 Family history of malignant neoplasm of digestive organs: Secondary | ICD-10-CM | POA: Diagnosis not present

## 2019-04-08 DIAGNOSIS — Z86718 Personal history of other venous thrombosis and embolism: Secondary | ICD-10-CM | POA: Diagnosis not present

## 2019-04-08 DIAGNOSIS — Z923 Personal history of irradiation: Secondary | ICD-10-CM | POA: Diagnosis not present

## 2019-04-08 DIAGNOSIS — Z803 Family history of malignant neoplasm of breast: Secondary | ICD-10-CM | POA: Diagnosis not present

## 2019-04-08 DIAGNOSIS — Z86711 Personal history of pulmonary embolism: Secondary | ICD-10-CM | POA: Diagnosis not present

## 2019-04-08 DIAGNOSIS — Z9221 Personal history of antineoplastic chemotherapy: Secondary | ICD-10-CM | POA: Insufficient documentation

## 2019-04-08 DIAGNOSIS — Z79899 Other long term (current) drug therapy: Secondary | ICD-10-CM | POA: Diagnosis not present

## 2019-04-08 DIAGNOSIS — Z7982 Long term (current) use of aspirin: Secondary | ICD-10-CM | POA: Insufficient documentation

## 2019-04-08 DIAGNOSIS — C773 Secondary and unspecified malignant neoplasm of axilla and upper limb lymph nodes: Secondary | ICD-10-CM | POA: Diagnosis not present

## 2019-04-08 DIAGNOSIS — G629 Polyneuropathy, unspecified: Secondary | ICD-10-CM | POA: Insufficient documentation

## 2019-04-08 LAB — COMPREHENSIVE METABOLIC PANEL
ALT: 22 U/L (ref 0–44)
AST: 18 U/L (ref 15–41)
Albumin: 3.9 g/dL (ref 3.5–5.0)
Alkaline Phosphatase: 174 U/L — ABNORMAL HIGH (ref 38–126)
Anion gap: 12 (ref 5–15)
BUN: 10 mg/dL (ref 6–20)
CO2: 24 mmol/L (ref 22–32)
Calcium: 9.2 mg/dL (ref 8.9–10.3)
Chloride: 104 mmol/L (ref 98–111)
Creatinine, Ser: 0.79 mg/dL (ref 0.44–1.00)
GFR calc Af Amer: >60 mL/min (ref >60)
GFR calc non Af Amer: >60 mL/min (ref >60)
Glucose, Bld: 89 mg/dL (ref 70–99)
Potassium: 3.6 mmol/L (ref 3.5–5.1)
Sodium: 140 mmol/L (ref 135–145)
Total Bilirubin: 0.6 mg/dL (ref 0.3–1.2)
Total Protein: 7.2 g/dL (ref 6.5–8.1)

## 2019-04-08 LAB — CBC WITH DIFFERENTIAL/PLATELET
Abs Immature Granulocytes: 0.02 10*3/uL (ref 0.00–0.07)
Basophils Absolute: 0 10*3/uL (ref 0.0–0.1)
Basophils Relative: 1 %
Eosinophils Absolute: 0.5 10*3/uL (ref 0.0–0.5)
Eosinophils Relative: 10 %
HCT: 36.3 % (ref 36.0–46.0)
Hemoglobin: 11.5 g/dL — ABNORMAL LOW (ref 12.0–15.0)
Immature Granulocytes: 0 %
Lymphocytes Relative: 14 %
Lymphs Abs: 0.7 10*3/uL (ref 0.7–4.0)
MCH: 28.3 pg (ref 26.0–34.0)
MCHC: 31.7 g/dL (ref 30.0–36.0)
MCV: 89.4 fL (ref 80.0–100.0)
Monocytes Absolute: 0.5 10*3/uL (ref 0.1–1.0)
Monocytes Relative: 11 %
Neutro Abs: 3 10*3/uL (ref 1.7–7.7)
Neutrophils Relative %: 64 %
Platelets: 241 10*3/uL (ref 150–400)
RBC: 4.06 MIL/uL (ref 3.87–5.11)
RDW: 14.7 % (ref 11.5–15.5)
WBC: 4.7 10*3/uL (ref 4.0–10.5)
nRBC: 0 % (ref 0.0–0.2)

## 2019-04-08 NOTE — Telephone Encounter (Signed)
Scheduled appt per 2/5 los.  Sent a message to HIM pool to get a calendar mailed out,

## 2019-04-09 ENCOUNTER — Encounter: Payer: Self-pay | Admitting: Hematology

## 2019-04-22 ENCOUNTER — Telehealth: Payer: Self-pay | Admitting: Hematology

## 2019-04-22 NOTE — Telephone Encounter (Signed)
Rescheduled per 2/17 sch msg, pt req. Called and spoke with pt, confirmed 8/6 appt

## 2019-04-25 DIAGNOSIS — G369 Acute disseminated demyelination, unspecified: Secondary | ICD-10-CM | POA: Diagnosis not present

## 2019-04-28 ENCOUNTER — Encounter: Payer: Self-pay | Admitting: Hematology

## 2019-04-28 DIAGNOSIS — R509 Fever, unspecified: Secondary | ICD-10-CM | POA: Diagnosis not present

## 2019-04-28 DIAGNOSIS — C50919 Malignant neoplasm of unspecified site of unspecified female breast: Secondary | ICD-10-CM | POA: Diagnosis not present

## 2019-04-29 DIAGNOSIS — G360 Neuromyelitis optica [Devic]: Secondary | ICD-10-CM | POA: Diagnosis not present

## 2019-05-04 ENCOUNTER — Other Ambulatory Visit: Payer: Self-pay

## 2019-05-04 ENCOUNTER — Ambulatory Visit
Admission: RE | Admit: 2019-05-04 | Discharge: 2019-05-04 | Disposition: A | Discharge status: Home / Self Care | Payer: BC Managed Care – PPO | Source: Ambulatory Visit | Attending: Hematology | Admitting: Hematology

## 2019-05-04 DIAGNOSIS — C50412 Malignant neoplasm of upper-outer quadrant of left female breast: Secondary | ICD-10-CM

## 2019-05-04 DIAGNOSIS — R922 Inconclusive mammogram: Secondary | ICD-10-CM | POA: Diagnosis not present

## 2019-05-04 DIAGNOSIS — Z01419 Encounter for gynecological examination (general) (routine) without abnormal findings: Secondary | ICD-10-CM | POA: Diagnosis not present

## 2019-05-04 DIAGNOSIS — Z171 Estrogen receptor negative status [ER-]: Secondary | ICD-10-CM

## 2019-05-04 DIAGNOSIS — Z853 Personal history of malignant neoplasm of breast: Secondary | ICD-10-CM | POA: Diagnosis not present

## 2019-05-13 DIAGNOSIS — M47814 Spondylosis without myelopathy or radiculopathy, thoracic region: Secondary | ICD-10-CM | POA: Diagnosis not present

## 2019-05-13 DIAGNOSIS — M47816 Spondylosis without myelopathy or radiculopathy, lumbar region: Secondary | ICD-10-CM | POA: Diagnosis not present

## 2019-05-13 DIAGNOSIS — M47812 Spondylosis without myelopathy or radiculopathy, cervical region: Secondary | ICD-10-CM | POA: Diagnosis not present

## 2019-05-13 DIAGNOSIS — G35 Multiple sclerosis: Secondary | ICD-10-CM | POA: Diagnosis not present

## 2019-05-13 DIAGNOSIS — G360 Neuromyelitis optica [Devic]: Secondary | ICD-10-CM | POA: Diagnosis not present

## 2019-05-16 DIAGNOSIS — G360 Neuromyelitis optica [Devic]: Secondary | ICD-10-CM | POA: Diagnosis not present

## 2019-05-25 DIAGNOSIS — N951 Menopausal and female climacteric states: Secondary | ICD-10-CM | POA: Diagnosis not present

## 2019-05-25 DIAGNOSIS — Z13820 Encounter for screening for osteoporosis: Secondary | ICD-10-CM | POA: Diagnosis not present

## 2019-07-07 ENCOUNTER — Ambulatory Visit: Payer: BC Managed Care – PPO | Attending: Internal Medicine

## 2019-07-07 ENCOUNTER — Other Ambulatory Visit: Payer: Self-pay

## 2019-07-07 DIAGNOSIS — Z20822 Contact with and (suspected) exposure to covid-19: Secondary | ICD-10-CM

## 2019-07-08 LAB — NOVEL CORONAVIRUS, NAA: SARS-CoV-2, NAA: NOT DETECTED

## 2019-07-08 LAB — SARS-COV-2, NAA 2 DAY TAT

## 2019-07-15 ENCOUNTER — Other Ambulatory Visit: Payer: Self-pay

## 2019-07-15 ENCOUNTER — Encounter: Payer: Self-pay | Admitting: Pulmonary Disease

## 2019-07-15 ENCOUNTER — Institutional Professional Consult (permissible substitution): Payer: BC Managed Care – PPO | Admitting: Pulmonary Disease

## 2019-07-15 ENCOUNTER — Ambulatory Visit: Payer: BC Managed Care – PPO | Admitting: Pulmonary Disease

## 2019-07-15 VITALS — BP 110/84 | HR 83 | Temp 97.1°F | Ht 64.0 in | Wt 181.6 lb

## 2019-07-15 DIAGNOSIS — J1282 Pneumonia due to coronavirus disease 2019: Secondary | ICD-10-CM

## 2019-07-15 DIAGNOSIS — R0602 Shortness of breath: Secondary | ICD-10-CM | POA: Diagnosis not present

## 2019-07-15 DIAGNOSIS — U071 COVID-19: Secondary | ICD-10-CM

## 2019-07-15 NOTE — Progress Notes (Signed)
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 She initially sefl quarantined for 14 days , but was admitted at the end of her quarantine when she presented with SOB. She was on bactrim at this time for UTI. She completed 4 days Remdesivir and given course of steroids. Her course was complicated by postviral bacterial pneumoniae treated with multiple rounds of antibiotics including ceftriaxone and azithromycin followed by multiple rounds of doxycycline. She has improved but continues to have a cough noticeable at night. Denies any sputum production. She has a low grade fever up until end of last week and a 30 lb weight loss since her infection started in November. She continues to follow with oncology and her breast cancer is in remission.    Pets: Poodles  Occupation: Customer service manager, works mostly in hospitals and universities on [employer]  Exposures: no hot exposure, pari an love birds 20 years, , no down comforter or pillow Smoking history: 10 years , 1 pp week Travel history: no travel history  Relevant family history: no family history of lung disease    Outpatient Encounter Medications as of 07/15/2019  Medication Sig  . albuterol (VENTOLIN HFA) 108 (90 Base) MCG/ACT inhaler Inhale 2 puffs into the lungs every 4 (four) hours as needed for wheezing or shortness of breath.  Marland Kitchen aspirin 81 MG tablet Take 1 tablet (81 mg total) by mouth daily.  . benzonatate (TESSALON) 100 MG  capsule Take 100 mg by mouth 3 (three) times daily.  . Cholecalciferol (VITAMIN D-1000 MAX ST) 1000 units tablet Take 5,000 Units by mouth daily.   Marland Kitchen doxycycline (VIBRAMYCIN) 100 MG capsule Take 1 capsule (100 mg total) by mouth 2 (two) times daily.  . vitamin C (VITAMIN C) 500 MG tablet Take 1 tablet (500 mg total) by mouth daily.  Marland Kitchen zinc sulfate 220 (50 Zn) MG capsule Take 1 capsule (220 mg total) by mouth daily. (Patient taking differently: Take 50 mg by mouth daily. )  . [DISCONTINUED] gabapentin (NEURONTIN) 100 MG capsule Take 1 capsule (100 mg total) by mouth 3 (three) times daily. (Patient taking differently: Take 300 mg by mouth daily. )  . [DISCONTINUED] pantoprazole (PROTONIX) 40 MG tablet Take 1 tablet (40 mg total) by mouth daily.   Facility-Administered Encounter Medications as of 07/15/2019  Medication  . heparin lock flush 100 unit/mL  . sodium chloride flush (NS) 0.9 % injection 10 mL  . sodium chloride flush (NS) 0.9 % injection 10 mL  . sodium chloride flush (NS) 0.9 % injection 10 mL    Allergies as of 07/15/2019 - Review Complete 04/09/2019  Allergen Reaction Noted  . Hydrocodone Other (See Comments) 07/23/2016    Past Medical History:  Diagnosis Date  . Anxiety   . Breast cancer ([facility])   . Family history of breast cancer   . Family history of colon cancer   . Malignant neoplasm of upper-outer quadrant of left female breast ([facility]) 01/31/2016  .  Neuromyelitis optica ([facility])   . Personal history of chemotherapy   . Personal history of radiation therapy     Past Surgical History:  Procedure Laterality Date  . ABDOMINAL HYSTERECTOMY     partial  . BREAST BIOPSY Left 2017  . BREAST LUMPECTOMY Left    2018  . BREAST LUMPECTOMY WITH RADIOACTIVE SEED AND SENTINEL LYMPH NODE BIOPSY Left 07/31/2016   Procedure: LEFT BREAST RADIOACTIVE SEED GUIDED LUMPECTOMY WITH LEFT RADIOACTIVE SEED TARGETED AXILLARY SENTINEL LYMPH NODE EXCISION AND SENTINEL LYMPH NODE BIOPSY;  Surgeon:  Rolm Bookbinder, MD;  Location: [facility];  Service: General;  Laterality: Left;  . BREAST REDUCTION SURGERY Bilateral 08/05/2016   Procedure: LEFT ONCOPLASTIC REDUCTION; RIGHT BREAST REDUCTION;  Surgeon: Irene Limbo, MD;  Location: [facility];  Service: Plastics;  Laterality: Bilateral;  . EYE SURGERY    . PORT-A-CATH REMOVAL Right 07/31/2016   Procedure: REMOVAL PORT-A-CATH;  Surgeon: Rolm Bookbinder, MD;  Location: [facility];  Service: General;  Laterality: Right;  . PORTACATH PLACEMENT Right 02/11/2016   Procedure: INSERTION PORT-A-CATH WITH [place];  Surgeon: Rolm Bookbinder, MD;  Location: [facility];  Service: General;  Laterality: Right;  . REDUCTION MAMMAPLASTY      Family History  Problem Relation Age of Onset  . Breast cancer Maternal Grandmother        dx in her 58s  . CAD Paternal Grandmother   . Colon cancer Paternal Uncle        dx in his 45s  . COPD Maternal Grandfather   . CAD Paternal Grandfather   . Breast cancer Cousin        dx in her early to mid 82s; maternal first cousin  . Lung cancer Paternal Uncle     Social History   Socioeconomic History  . Marital status: Married    Spouse name: Not on file  . Number of children: Not on file  . Years of education: Not on file  . Highest education level: Not on file  Occupational History  . Not on file  Tobacco Use  . Smoking status: Former Smoker    Types: Cigarettes    Quit date: 09/03/2001    Years since quitting: 17.8  . Smokeless tobacco: Never Used  Substance and Sexual Activity  . Alcohol use: Yes    Comment: social  . Drug use: No  . Sexual activity: Not on file  Other Topics Concern  . Not on file  Social History Narrative  . Not on file   Social Determinants of Health   Financial Resource Strain:   . Difficulty of Paying Living Expenses:   Food Insecurity:   . Worried About Charity fundraiser in the Last Year:   . Youth worker in the Last Year:   Transportation Needs:   . Film/video editor (Medical):   Marland Kitchen Lack of Transportation (Non-Medical):   Physical Activity:   . Days of Exercise per Week:   . Minutes of Exercise per Session:   Stress:   . Feeling of Stress :   Social Connections:   . Frequency of Communication with Friends and Family:   . Frequency of Social Gatherings with Friends and Family:   . Attends Religious Services:   . Active Member of Clubs or Organizations:   . Attends Archivist Meetings:   Marland Kitchen Marital Status:   Intimate Partner Violence:   . Fear of Current or Ex-Partner:   .  Emotionally Abused:   Marland Kitchen Physically Abused:   . Sexually Abused:     Review of systems: Review of Systems  Constitutional: Negative for fever and chills.  HENT: Negative.   Eyes: Negative for blurred vision.  Respiratory: as per HPI  Cardiovascular: Negative for chest pain and palpitations.  Gastrointestinal: Negative for vomiting, diarrhea, blood per rectum. Genitourinary: Negative for dysuria, urgency, frequency and hematuria.  Musculoskeletal: Negative for myalgias, back pain and joint pain.  Skin: Negative for itching and rash.  Neurological: Negative for dizziness, tremors, focal weakness, seizures and loss of consciousness.  Endo/Heme/Allergies: Negative for environmental allergies.  Psychiatric/Behavioral: Negative for depression, suicidal ideas and hallucinations.  All other systems reviewed and are negative.  Physical Exam: There were no vitals taken for this visit. General: NAD, nl appearance, coughs a couple time during exam HE: Normocephalic, atraumatic , EOMI, Conjunctivae normal ENT: No congestion, no rhinorrhea, no exudate or erythema  Cardiovascular: Normal rate, regular rhythm.  No murmurs, rubs, or gallops Pulmonary : Effort normal, breath sounds normal. No wheezes, rales, or rhonchi Abdominal: soft, nontender,  bowel sounds present Musculoskeletal: no swelling ,  deformity, injury ,or tenderness in extremities, Skin: Warm, dry , no bruising, erythema, or rash Psychiatric/Behavioral:  normal mood, normal behavior    Data Reviewed: Imaging:   CT Angio Chest 02/27/2019: No PE, bilateral  scattered  ground glass opacity   Chest 2 view 03/22/2019: Improvement compared to prior imaging, poor expansion , opacity in lingula   PFTs: n/a  Labs:  CBC 04/08/2019:  WBC 4.7   Assessment:  York Grice present post covid pneumonia complicated by post viral bacteral pneumonia needing multiple rounds of antibiotics. Patient is on rituximab for neuromyelitis optica which makes her more susceptible to infection.  Overall she has improved and her low grade fever resolved last week. Continues to have non productive cough.  She had findings on CT Angio Chest consistent with changes we see after  Covid -19 pneumonia. We will further evaluate her lungs with a HRCT. We will do a 6 minute walk test and have patient scheduled for PFT's. After these studies are complete we will have patient follow up in office to discuss results.   Plan/Recommendations: -HRCT - PFT's - 6 minute walk test - Follow up after HRCT and PFT's are completed  Tamsen Snider, MD PGY1   Attending note: I have seen and examined the patient. History, labs and imaging reviewed. Agree with assessment and plan  Marshell Garfinkel MD [facility] Please see [email or web address] for pager details.  07/15/2019, 1:44 PM   CC: Sharilyn Sites, MD

## 2019-07-15 NOTE — Patient Instructions (Signed)
I am glad you are improving from your COVID-19 infection and post viral bacterial pneumonia. To better evaluate your lungs and see if you have any ongoing inflammation we will order a high resolution CT chest. In addition to see how your lungs have been effected we will order pulmonary function test. We will also do a walk test with you today to check how activity effects your breathing. We will schedule follow up with you after the imaging and PFT's are complete.   -HRCT - PFT's - 6 minute walk test - Follow up after HRCT and PFT's are completed

## 2019-07-22 ENCOUNTER — Ambulatory Visit: Payer: BC Managed Care – PPO | Attending: Internal Medicine

## 2019-07-22 DIAGNOSIS — Z23 Encounter for immunization: Secondary | ICD-10-CM

## 2019-07-22 NOTE — Progress Notes (Signed)
   Covid-19 Vaccination Clinic  Name:  Grace Moyer    MRN: [id number] DOB: 1960/08/11  07/22/2019  [name] was observed post Covid-19 immunization for 15 minutes without incident. She was provided with Vaccine Information Sheet and instruction to access the V-Safe system.   [name] was instructed to call 911 with any severe reactions post vaccine: Marland Kitchen Difficulty breathing  . Swelling of face and throat  . A fast heartbeat  . A bad rash all over body  . Dizziness and weakness   Immunizations Administered    Name Date Dose VIS Date Route   Pfizer COVID-19 Vaccine 07/22/2019 10:57 AM 0.3 mL 04/27/2018 Intramuscular   Manufacturer: [place]   Lot: [id number]   [place]: [id number]

## 2019-07-29 ENCOUNTER — Ambulatory Visit (HOSPITAL_COMMUNITY): Payer: BC Managed Care – PPO

## 2019-08-05 ENCOUNTER — Other Ambulatory Visit: Payer: Self-pay

## 2019-08-05 ENCOUNTER — Ambulatory Visit (HOSPITAL_COMMUNITY)
Admission: RE | Admit: 2019-08-05 | Discharge: 2019-08-05 | Disposition: A | Discharge status: Home / Self Care | Payer: BC Managed Care – PPO | Source: Ambulatory Visit | Attending: Pulmonary Disease | Admitting: Pulmonary Disease

## 2019-08-05 DIAGNOSIS — U071 COVID-19: Secondary | ICD-10-CM

## 2019-08-05 DIAGNOSIS — J1282 Pneumonia due to coronavirus disease 2019: Secondary | ICD-10-CM | POA: Insufficient documentation

## 2019-08-05 DIAGNOSIS — R0602 Shortness of breath: Secondary | ICD-10-CM | POA: Insufficient documentation

## 2019-08-12 NOTE — Telephone Encounter (Signed)
Dr. Concepcion Living please advise.

## 2019-08-15 NOTE — Telephone Encounter (Signed)
I would advise to go ahead and get the second dose of the COVID vaccine. It should be ok even with low grade fevers.

## 2019-08-16 ENCOUNTER — Ambulatory Visit: Payer: BC Managed Care – PPO

## 2019-08-19 ENCOUNTER — Ambulatory Visit: Payer: BC Managed Care – PPO | Attending: Internal Medicine

## 2019-08-19 DIAGNOSIS — Z23 Encounter for immunization: Secondary | ICD-10-CM

## 2019-08-19 NOTE — Progress Notes (Signed)
   Covid-19 Vaccination Clinic  Name:  Grace Moyer    MRN: [id number] DOB: April 24, 1960  08/19/2019  [name] was observed post Covid-19 immunization for 15 minutes without incident. She was provided with Vaccine Information Sheet and instruction to access the V-Safe system.   Ms. Gains was instructed to call 911 with any severe reactions post vaccine: Marland Kitchen Difficulty breathing  . Swelling of face and throat  . A fast heartbeat  . A bad rash all over body  . Dizziness and weakness   Immunizations Administered    Name Date Dose VIS Date Route   Pfizer COVID-19 Vaccine 08/19/2019 11:45 AM 0.3 mL 04/27/2018 Intramuscular   Manufacturer: [place]   Lot: [id number]   [place]: [id number]

## 2019-08-26 DIAGNOSIS — G360 Neuromyelitis optica [Devic]: Secondary | ICD-10-CM | POA: Diagnosis not present

## 2019-08-30 ENCOUNTER — Other Ambulatory Visit: Payer: Self-pay

## 2019-08-30 ENCOUNTER — Other Ambulatory Visit
Admission: RE | Admit: 2019-08-30 | Discharge: 2019-08-30 | Disposition: A | Discharge status: Home / Self Care | Payer: BC Managed Care – PPO | Source: Ambulatory Visit | Attending: Cardiology | Admitting: Cardiology

## 2019-08-30 DIAGNOSIS — Z01812 Encounter for preprocedural laboratory examination: Secondary | ICD-10-CM | POA: Diagnosis not present

## 2019-08-30 DIAGNOSIS — Z20822 Contact with and (suspected) exposure to covid-19: Secondary | ICD-10-CM | POA: Insufficient documentation

## 2019-08-30 LAB — SARS CORONAVIRUS 2 (TAT 6-24 HRS): SARS Coronavirus 2: NEGATIVE

## 2019-09-01 ENCOUNTER — Other Ambulatory Visit: Payer: Self-pay

## 2019-09-01 ENCOUNTER — Other Ambulatory Visit: Payer: Self-pay | Admitting: *Deleted

## 2019-09-01 ENCOUNTER — Ambulatory Visit (INDEPENDENT_AMBULATORY_CARE_PROVIDER_SITE_OTHER): Payer: BC Managed Care – PPO | Admitting: Pulmonary Disease

## 2019-09-01 DIAGNOSIS — J1282 Pneumonia due to coronavirus disease 2019: Secondary | ICD-10-CM | POA: Diagnosis not present

## 2019-09-01 DIAGNOSIS — U071 COVID-19: Secondary | ICD-10-CM | POA: Diagnosis not present

## 2019-09-01 DIAGNOSIS — R0602 Shortness of breath: Secondary | ICD-10-CM

## 2019-09-01 LAB — PULMONARY FUNCTION TEST
DL/VA % pred: 83 %
DL/VA: 3.53 ml/min/mmHg/L
DLCO cor % pred: 63 %
DLCO cor: 13.06 ml/min/mmHg
DLCO unc % pred: 63 %
DLCO unc: 13.06 ml/min/mmHg
FEF 25-75 Post: 3.27 L/sec
FEF 25-75 Pre: 1.79 L/sec
FEF2575-%Change-Post: 82 %
FEF2575-%Pred-Post: 131 %
FEF2575-%Pred-Pre: 72 %
FEV1-%Change-Post: 21 %
FEV1-%Pred-Post: 89 %
FEV1-%Pred-Pre: 73 %
FEV1-Post: 2.37 L
FEV1-Pre: 1.95 L
FEV1FVC-%Change-Post: 18 %
FEV1FVC-%Pred-Pre: 93 %
FEV6-%Change-Post: 11 %
FEV6-%Pred-Post: 81 %
FEV6-%Pred-Pre: 73 %
FEV6-Post: 2.67 L
FEV6-Pre: 2.4 L
FEV6FVC-%Pred-Post: 103 %
FEV6FVC-%Pred-Pre: 103 %
FVC-%Change-Post: 2 %
FVC-%Pred-Post: 79 %
FVC-%Pred-Pre: 77 %
FVC-Post: 2.7 L
FVC-Pre: 2.64 L
Post FEV1/FVC ratio: 88 %
Post FEV6/FVC ratio: 100 %
Pre FEV1/FVC ratio: 74 %
Pre FEV6/FVC Ratio: 100 %
RV % pred: 69 %
RV: 1.34 L
TLC % pred: 80 %
TLC: 4.08 L

## 2019-09-01 MED ORDER — ALBUTEROL SULFATE HFA 108 (90 BASE) MCG/ACT IN AERS: 2.0000 | INHALATION_SPRAY | RESPIRATORY_TRACT | 0 refills | Status: DC | PRN

## 2019-09-01 NOTE — Progress Notes (Signed)
Full PFT performed today. Patient also requested refill on rescue inhaler Albuterol (sent).

## 2019-09-20 ENCOUNTER — Encounter: Payer: Self-pay | Admitting: Pulmonary Disease

## 2019-09-20 ENCOUNTER — Ambulatory Visit (INDEPENDENT_AMBULATORY_CARE_PROVIDER_SITE_OTHER): Payer: BC Managed Care – PPO

## 2019-09-20 ENCOUNTER — Ambulatory Visit (INDEPENDENT_AMBULATORY_CARE_PROVIDER_SITE_OTHER): Payer: BC Managed Care – PPO | Admitting: Pulmonary Disease

## 2019-09-20 ENCOUNTER — Other Ambulatory Visit: Payer: Self-pay

## 2019-09-20 VITALS — BP 110/68 | HR 75 | Temp 98.1°F | Ht 63.5 in | Wt 178.2 lb

## 2019-09-20 DIAGNOSIS — R0602 Shortness of breath: Secondary | ICD-10-CM

## 2019-09-20 DIAGNOSIS — J1282 Pneumonia due to coronavirus disease 2019: Secondary | ICD-10-CM

## 2019-09-20 DIAGNOSIS — U071 COVID-19: Secondary | ICD-10-CM

## 2019-09-20 MED ORDER — BREO ELLIPTA 200-25 MCG/INH IN AEPB: 1.0000 | INHALATION_SPRAY | Freq: Every day | RESPIRATORY_TRACT | 0 refills | Status: DC

## 2019-09-20 MED ORDER — BREO ELLIPTA 200-25 MCG/INH IN AEPB: 1.0000 | INHALATION_SPRAY | Freq: Every day | RESPIRATORY_TRACT | 5 refills | Status: DC

## 2019-09-20 NOTE — Progress Notes (Signed)
Six Minute Walk - 09/20/19 1436      Six Minute Walk   Medications taken before test (dose and time) Vitamin C 500mg , Aspirin 81mg , Vitamin D 1000 units, Zinc 220mg  at 7:30am    Supplemental oxygen during test? No    Lap distance in meters  34 meters    Laps Completed 13    Partial lap (in meters) 0 meters    Baseline BP (sitting) 108/60    Baseline Heartrate 74    Baseline Dyspnea (Borg Scale) 0    Baseline Fatigue (Borg Scale) 0    Baseline SPO2 97 %      End of Test Values    BP (sitting) 110/70    Heartrate 84    Dyspnea (Borg Scale) 0    Fatigue (Borg Scale) 0    SPO2 98 %      2 Minutes Post Walk Values   BP (sitting) 110/68    Heartrate 75    SPO2 100 %    Stopped or paused before six minutes? No      Interpretation   Distance completed 442 meters    Tech Comments: Pt walked at an average pace completing entire 6 minutes without stopping and denied any complaints.

## 2019-09-20 NOTE — Progress Notes (Addendum)
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 She initially sefl quarantined for 14 days , but was admitted at the end of her quarantine when she presented with SOB. She was on bactrim at this time for UTI. She completed 4 days Remdesivir and given course of steroids. Her course was complicated by postviral bacterial pneumoniae treated with multiple rounds of antibiotics including ceftriaxone and azithromycin followed by multiple rounds of doxycycline. She has improved but continues to have a cough noticeable at night. Denies any sputum production. She has a low grade fever up until end of last week and a 30 lb weight loss since her infection started in November. She continues to follow with oncology and her breast cancer is in remission.    Pets: Poodles  Occupation: Customer service manager, works mostly in hospitals and universities on [employer]  Exposures: no hot exposure, pari an love birds 20 years, , no down comforter or pillow Smoking history: 10 years , 1 pp week Travel history: no travel history  Relevant family history: no family history of lung disease   Interim history: Continues to improve with regard to breathing.  She still has some mild dyspnea on exertion Continues to be active with exercise.   Outpatient Encounter Medications as of 09/20/2019  Medication Sig  . albuterol (VENTOLIN HFA) 108 (90 Base) MCG/ACT inhaler Inhale 2 puffs into the lungs every 4 (four) hours as  needed for wheezing or shortness of breath.  Marland Kitchen aspirin 81 MG tablet Take 1 tablet (81 mg total) by mouth daily.  . Cholecalciferol (VITAMIN D-1000 MAX ST) 1000 units tablet Take 5,000 Units by mouth daily.   Marland Kitchen gabapentin (NEURONTIN) 300 MG capsule Take 300 mg by mouth at bedtime.  . vitamin C (VITAMIN C) 500 MG tablet Take 1 tablet (500 mg total) by mouth daily.  Marland Kitchen VITAMIN D PO Take 3,000 Units by mouth daily.  Marland Kitchen zinc sulfate 220 (50 Zn) MG capsule Take 1 capsule (220 mg total) by mouth daily. (Patient taking differently: Take 50 mg by mouth daily. )  . [DISCONTINUED] benzonatate (TESSALON) 100 MG capsule Take 100 mg by mouth 3 (three) times daily.  . [DISCONTINUED] gabapentin (NEURONTIN) 100 MG capsule Take 1 capsule (100 mg total) by mouth 3 (three) times daily. (Patient taking differently: Take 300 mg by mouth daily. )  . [DISCONTINUED] pantoprazole (PROTONIX) 40 MG tablet Take 1 tablet (40 mg total) by mouth daily.   Facility-Administered Encounter Medications as of 09/20/2019  Medication  . heparin lock flush 100 unit/mL  . sodium chloride flush (NS) 0.9 % injection 10 mL  . sodium chloride flush (NS) 0.9 % injection 10 mL  . sodium chloride flush (NS) 0.9 % injection 10 mL    Physical Exam: Blood pressure 110/68, pulse 75, temperature 98.1 F (36.7 C), temperature source Oral, height 5' 3.5" (1.613 m), weight 178 lb 3.2 oz (80.8 kg), SpO2 100 %. Gen:  No acute distress HEENT:  EOMI, sclera anicteric Neck:     No masses; no thyromegaly Lungs:    Clear to auscultation bilaterally; normal respiratory effort CV:         Regular rate and rhythm; no murmurs Abd:      + bowel sounds; soft, non-tender; no palpable masses, no distension Ext:    No edema; adequate peripheral perfusion Skin:      Warm and dry; no rash Neuro: alert and oriented x 3 Psych: normal mood and affect  Data Reviewed: Imaging:  CT Angio Chest 02/27/2019: No PE, bilateral  scattered  ground glass opacity   Chest 2 view 03/22/2019: Improvement compared to prior imaging, poor expansion , opacity in lingula  HRCT 08/05/2019-improving bilateral groundglass opacities, mild subpleural fibrosis in the anterior upper lobe. I have reviewed the images personally  PFTs:  09/01/2019 FVC 2.70 [79%], FEV1 2.37 [89%], F/F 88, TLC 4.08 [80%], DLCO 13.06 [63%] Minimal reversible obstruction, moderate diffusion defect  6-minute walk test 09/20/2019- 47 m, nadir O2 sat of 98%  Labs: CBC 04/08/2019-WBC 4.7, eos 10%, absolute eosinophil count 470  Assessment:  Post Covid pneumonia Has post covid pneumonia complicated by post viral bacteral pneumonia needing multiple rounds of antibiotics. Patient is on rituximab for neuromyelitis optica which makes her more susceptible to infection.  Overall improved symptomatically HRCT reviewed with improvement in inflammatory changes with minimal post residual fibrosis  PFTs show mild reversible obstruction and diffusion defect We will start her on Breo inhaler as a suspect she may have asthma, airway inflammation.  Plan/Recommendations: Continue exercise program Start Breo inhaler  Marshell Garfinkel MD [facility] Please see [email or web address] for pager details.  09/20/2019, 2:58 PM   CC: Sharilyn Sites, MD

## 2019-09-20 NOTE — Progress Notes (Signed)
f °

## 2019-09-20 NOTE — Patient Instructions (Signed)
I am glad you are doing well with regard to your breathing Your lung function test shows signs of mild asthma We will start you on an inhaler called Breo 200  Follow-up in 6 months.

## 2019-09-24 ENCOUNTER — Encounter: Payer: Self-pay | Admitting: Pulmonary Disease

## 2019-10-03 NOTE — Progress Notes (Signed)
Grace Moyer   [phone] Fax:([phone]   Clinic Follow up Note   Patient Care Team: Sharilyn Sites, MD as PCP - General (Family Medicine) Collene Gobble, MD as Attending Physician (Psychiatry) Rolm Bookbinder, MD as Consulting Physician (General Surgery) Truitt Merle, MD as Consulting Physician (Hematology) Kyung Rudd, MD as Consulting Physician (Radiation Oncology) Delice Bison Charlestine Massed, NP as Nurse Practitioner (Hematology and Oncology)  Date of Service:  10/07/2019  CHIEF COMPLAINT: Follow up left breast triple negative cancer  SUMMARY OF ONCOLOGIC HISTORY: Oncology History Overview Note  Cancer Staging Malignant neoplasm of upper-outer quadrant of left female breast [facility]) Staging form: Breast, AJCC 7th Edition - Clinical stage from 01/29/2016: Stage IIB (T2, N1, M0) - Signed by Truitt Merle, MD on 02/06/2016 - Pathologic stage from 07/31/2016: T0, N0, cM0 - Signed by Truitt Merle, MD on 12/31/2016     Malignant neoplasm of upper-outer quadrant of left female breast ([place])  01/28/2016 Mammogram   Diagnostic MM and US showed a 2.9cm (3.2X2.2X2.5cm on [place]) mass in [place], multiple enlarged left axillary nodes, largest 2.5cm.    01/29/2016 Initial Diagnosis   Malignant neoplasm of upper-outer quadrant of left female breast ([place])   01/29/2016 Initial Biopsy   Left breast mass and axillary node biopsy showed IDC, G3,    01/29/2016 Receptors her2   ER-, PR-, HER2-, Ki67 85%   02/11/2016 Imaging   Bilateral breast MRI with and without contrast showed a 3.3 x 2.6 x 2.6 cm mass in the upper outer left breast, and left axillary lymphadenopathy, at least 3 abnormal lymph nodes, no other additional sites of concern.   02/12/2016 Imaging   CT chest, abdomen and pelvis with contrast showed a 2.4 x 2.7 cm lesion in the upper outer left breast, left axillary node metastasis measuring up to 1.3 cm, no evidence of distant metastasis.   02/12/2016 Imaging   Bone  scan was negative for skeletal metastasis.   02/13/2016 - 07/03/2016 Neo-Adjuvant Chemotherapy   Dose dense Adriamycin 60 mg/m, Cytoxan 600 mg/m, every 2 weeks, for 4 cycles, followed by weekly carboplatin and Taxol for 12 weeks    03/27/2016 Genetic Testing   Patient has genetic testing done for personal history of breast cancer, family history of cancer. ATM c.2606C>T VUS identified on the Breast/GYN panel.  Negative genetic testing for the MSH2 inversion analysis ([place] inversion). The Breast/GYN gene panel offered by [facility] includes sequencing and rearrangement analysis for the following 23 genes:  ATM, BARD1, BRCA1, BRCA2, BRIP1, CDH1, CHEK2, EPCAM, FANCC, MLH1, MSH2, MSH6, MUTYH, NBN, NF1, PALB2, PMS2, POLD1, PTEN, RAD51C, RAD51D, RECQL, and TP53.      07/11/2016 Imaging   Bilateral Breast MRI FINDINGS: Breast composition: c. Heterogeneous fibroglandular tissue.  Background parenchymal enhancement: Minimal.  Right breast: No mass or abnormal enhancement.  Left breast: No mass or abnormal enhancement. Biopsy clip is identified at the posterior left breast upper outer quadrant. The previously noted enhancing mass is not seen currently.  Lymph nodes: No abnormal appearing lymph nodes. Previously noted abnormal lymph nodes in the left axilla are currently normal size.  Ancillary findings:  None.  IMPRESSION: Known cancer.   07/31/2016 Pathology Results   Diagnosis 1. Breast, lumpectomy, Left - LOBULAR NEOPLASIA (ATYPICAL LOBULAR HYPERPLASIA). - FIBROCYSTIC CHANGES WITH ADENOSIS AND CALCIFICATIONS. - [place]. - SEE ONCOLOGY TABLE BELOW. 2. Breast, excision, Left additional Medial Margin - LOBULAR NEOPLASIA (ATYPICAL LOBULAR HYPERPLASIA). - FIBROCYSTIC CHANGES WITH ADENOSIS AND CALCIFICATIONS. - RADIAL SCAR. -  HEALING BIOPSY SITE. - SEE COMMENT. 3. Lymph node, sentinel, biopsy, Left - THERE IS NO EVIDENCE OF CARCINOMA IN 1 OF 1 LYMPH NODE (0/1) -  CHANGES CONSISTENT WITH PRIOR PROCEDURE. 4. Lymph node, sentinel, biopsy, Left - THERE IS NO EVIDENCE OF CARCINOMA IN 1 OF 1 LYMPH NODE (0/1) 5. Lymph node, sentinel, biopsy, Left - THERE IS NO EVIDENCE OF CARCINOMA IN 1 OF 1 LYMPH NODE (0/1) 6. Lymph node, sentinel, biopsy, Left - THERE IS NO EVIDENCE OF CARCINOMA IN 1 OF 1 LYMPH NODE (0/1) 7. Lymph node, sentinel, biopsy, Left - THERE IS NO EVIDENCE OF CARCINOMA IN 1 OF 1 LYMPH NODE (0/1)   07/31/2016 Surgery   LEFT BREAST RADIOACTIVE SEED GUIDED LUMPECTOMY WITH LEFT RADIOACTIVE SEED TARGETED AXILLARY SENTINEL LYMPH NODE EXCISION AND SENTINEL LYMPH NODE BIOPSY and port removal done by Dr. Donne Hazel.     08/05/2016 Pathology Results   Surgical pathology  Diagnosis 1. Breast, Mammoplasty, Left - SCLEROSING ADENOSIS WITH CALCIFICATIONS - FIBROCYSTIC CHANGES - DUCT ECTASIA - NO MALIGNANCY IDENTIFIED 2. Breast, Mammoplasty, Right - SCLEROSING ADENOSIS WITH CALCIFICATIONS - FIBROCYSTIC CHANGES - DUCT ECTASIA - NO MALIGNANCY IDENTIFIED   08/05/2016 Surgery   LEFT ONCOPLASTIC REDUCTION; RIGHT BREAST REDUCTION by Dr. Iran Planas    09/11/2016 - 10/27/2016 Radiation Therapy   Radiation with Dr. Lisbeth Renshaw  Radiation treatment dates:   09/11/2016 to 10/27/2016  Site/dose:    1. The Left breast was treated to 50.4 Gy in 28 fractions at 1.8 Gy per fraction. 2. The Left Sclav was treated to 50.4 Gy in 28 fractions at 1.8 Gy per fraction. 3. The Left breast was boosted to 10 Gy in 5 fractions at 2 Gy per fraction.   Beams/energy:    1. 3D // 10X, 15X 2. photon // 15X, 6X  Narrative: The patient tolerated radiation treatment relatively well.  She developed mild erythema within the treatment field without any moist desquamation. Using Radiaplex as directed and using neosporin under inframammary fold where skin has peeled.    01/19/2017 Mammogram   IMPRESSION: No mammographic evidence of malignancy, status post left lumpectomy and bilateral  reduction mammoplasty.      CURRENT THERAPY:  Surveillance  INTERVAL HISTORY:  Grace Moyer is here for a follow up of left breast cancer. She was last seen by me 6 months ago. She presents to the clinic alone. She notes she is doing well. She is still being seen by pulmonologist since her [place] in 2020. She notes she has asthma and uses inhaler as needed. Her breathing has improved. She mostly only coughs now at night. She denies new pain. She notes she lost weight when she had COVID19. She notes she has MS Hug symptoms which is feeling tightness of ribs nightly. She uses Gabapentin for this.     REVIEW OF SYSTEMS:   Constitutional: Denies fevers, chills or abnormal weight loss Eyes: Denies blurriness of vision Ears, nose, mouth, throat, and face: Denies mucositis or sore throat Respiratory: Denies cough, dyspnea or wheezes Cardiovascular: Denies palpitation, chest discomfort or lower extremity swelling Gastrointestinal:  Denies nausea, heartburn or change in bowel habits Skin: Denies abnormal skin rashes MSK: (+) Nightly rib pain Lymphatics: Denies new lymphadenopathy or easy bruising Neurological:Denies numbness, tingling or new weaknesses Behavioral/Psych: Mood is stable, no new changes  All other systems were reviewed with the patient and are negative.  MEDICAL HISTORY:  Past Medical History:  Diagnosis Date   Anxiety    Breast cancer ([place])  Family history of breast cancer    Family history of colon cancer    Malignant neoplasm of upper-outer quadrant of left female breast ([place]) 01/31/2016   Neuromyelitis optica ([place])    Personal history of chemotherapy    Personal history of radiation therapy     SURGICAL HISTORY: Past Surgical History:  Procedure Laterality Date   ABDOMINAL HYSTERECTOMY     partial   BREAST BIOPSY Left 2017   BREAST LUMPECTOMY Left    2018   BREAST LUMPECTOMY WITH RADIOACTIVE SEED AND SENTINEL LYMPH NODE BIOPSY Left  07/31/2016   Procedure: LEFT BREAST RADIOACTIVE SEED GUIDED LUMPECTOMY WITH LEFT RADIOACTIVE SEED TARGETED AXILLARY SENTINEL LYMPH NODE EXCISION AND SENTINEL LYMPH NODE BIOPSY;  Surgeon: Rolm Bookbinder, MD;  Location: [facility];  Service: General;  Laterality: Left;   BREAST REDUCTION SURGERY Bilateral 08/05/2016   Procedure: LEFT ONCOPLASTIC REDUCTION; RIGHT BREAST REDUCTION;  Surgeon: Irene Limbo, MD;  Location: [facility];  Service: Plastics;  Laterality: Bilateral;   EYE SURGERY     PORT-A-CATH REMOVAL Right 07/31/2016   Procedure: REMOVAL PORT-A-CATH;  Surgeon: Rolm Bookbinder, MD;  Location: [facility];  Service: General;  Laterality: Right;   PORTACATH PLACEMENT Right 02/11/2016   Procedure: INSERTION PORT-A-CATH WITH [place];  Surgeon: Rolm Bookbinder, MD;  Location: [facility];  Service: General;  Laterality: Right;   REDUCTION MAMMAPLASTY      I have reviewed the social history and family history with the patient and they are unchanged from previous note.  ALLERGIES:  is allergic to hydrocodone.  MEDICATIONS:  Current Outpatient Medications  Medication Sig Dispense Refill   albuterol (VENTOLIN HFA) 108 (90 Base) MCG/ACT inhaler Inhale 2 puffs into the lungs every 4 (four) hours as needed for wheezing or shortness of breath. 6.7 g 0   aspirin 81 MG tablet Take 1 tablet (81 mg total) by mouth daily. 30 tablet    Cholecalciferol (VITAMIN D-1000 MAX ST) 1000 units tablet Take 5,000 Units by mouth daily.      fluticasone furoate-vilanterol (BREO ELLIPTA) 200-25 MCG/INH AEPB Inhale 1 puff into the lungs daily. 60 each 5   gabapentin (NEURONTIN) 300 MG capsule Take 300 mg by mouth at bedtime.     vitamin C (VITAMIN C) 500 MG tablet Take 1 tablet (500 mg total) by mouth daily. 30 tablet 0   VITAMIN D PO Take 3,000 Units by mouth daily.     zinc sulfate 220 (50 Zn) MG capsule Take 1 capsule (220 mg total) by  mouth daily. (Patient taking differently: Take 50 mg by mouth daily. ) 30 capsule 0   No current facility-administered medications for this visit.   Facility-Administered Medications Ordered in Other Visits  Medication Dose Route Frequency Provider Last Rate Last Admin   heparin lock flush 100 unit/mL  500 Units Intravenous Once PRN Truitt Merle, MD       sodium chloride flush (NS) 0.9 % injection 10 mL  10 mL Intracatheter PRN Truitt Merle, MD   10 mL at 06/18/16 1540   sodium chloride flush (NS) 0.9 % injection 10 mL  10 mL Intravenous PRN Truitt Merle, MD       sodium chloride flush (NS) 0.9 % injection 10 mL  10 mL Intravenous PRN Truitt Merle, MD   10 mL at 06/25/16 1106    PHYSICAL EXAMINATION: ECOG PERFORMANCE STATUS: 1 - Symptomatic but completely ambulatory  Vitals:   10/07/19 1421  BP: 110/64  Pulse: 90  Resp: 18  Temp: 98.5 F (36.9 C)  SpO2: 96%   Filed Weights   10/07/19 1421  Weight: 179 lb 1.6 oz (81.2 kg)    GENERAL:alert, no distress and comfortable SKIN: skin color, texture, turgor are normal, no rashes or significant lesions EYES: normal, Conjunctiva are pink and non-injected, sclera clear  NECK: supple, thyroid normal size, non-tender, without nodularity LYMPH:  no palpable lymphadenopathy in the cervical, axillary  LUNGS: clear to auscultation and percussion with normal breathing effort HEART: regular rate & rhythm and no murmurs and no lower extremity edema ABDOMEN:abdomen soft, non-tender and normal bowel sounds Musculoskeletal:no cyanosis of digits and no clubbing  NEURO: alert & oriented x 3 with fluent speech, no focal motor/sensory deficits BREAST:S/p  Left lumpectomy: Surgical incision healed well. No palpable mass, nodules or adenopathy bilaterally. Breast exam benign.   LABORATORY DATA:  I have reviewed the data as listed CBC Latest Ref Rng & Units 10/07/2019 04/08/2019 02/10/2019  WBC 4.0 - 10.5 K/uL 6.0 4.7 3.3(L)  Hemoglobin 12.0 - 15.0 g/dL 11.9(L)  11.5(L) 13.1  Hematocrit 36 - 46 % 37.4 36.3 40.6  Platelets 150 - 400 K/uL 218 241 199     CMP Latest Ref Rng & Units 04/08/2019 02/27/2019 02/13/2019  Glucose 70 - 99 mg/dL 89 107(H) 105(H)  BUN 6 - 20 mg/dL 10 11 20   Creatinine 0.44 - 1.00 mg/dL 0.79 0.83 0.73  Sodium 135 - 145 mmol/L 140 138 139  Potassium 3.5 - 5.1 mmol/L 3.6 4.7 3.8  Chloride 98 - 111 mmol/L 104 106 106  CO2 22 - 32 mmol/L 24 22 23   Calcium 8.9 - 10.3 mg/dL 9.2 8.2(L) 8.5(L)  Total Protein 6.5 - 8.1 g/dL 7.2 - 6.1(L)  Total Bilirubin 0.3 - 1.2 mg/dL 0.6 - 0.3  Alkaline Phos 38 - 126 U/L 174(H) - 190(H)  AST 15 - 41 U/L 18 - 20  ALT 0 - 44 U/L 22 - 30      RADIOGRAPHIC STUDIES: I have personally reviewed the radiological images as listed and agreed with the findings in the report. No results found.   ASSESSMENT & PLAN:  Grace Moyer is a 59 y.o. female with    1. Malignant neoplasm of upper-outer quadrant of left breast, invasive ductal carcinoma, G3, cT2N1M0, stage IIB, ER-/PR-/HER2-, ypT0N0 -She was diagnosed in 01/2016. She is s/p neo-adjuvant ddAC and CT, left breast lumpectomy and reconstruction, adjuvant radiation. -Her 05/2019 DEXA shows normal Bone density with lowest T-score -0.7.  -From a breast cancer standpoint, She is clinically doing well. Lab reviewed, her CBC and CMP are within normal limits except Hg 11.9. Physical exam benign. Her 05/2019 Mammogram was normal.  -She is almost 4 years from her cancer diagnosis. Her risk of recurrence has decreased significantly. Continue 5 year surveillance.  -F/u in 1 year    2. History of left LE DVT and bilateral acute submassive PE, provoked by surgeries  -She developed extensive left lower extremity DVT with bilateral acute submassive PE, requiring hospitalization -This is probably provoked by her breast surgery andimmobility -She completed6 months ofXarelto in 01/2017. She is now on baby aspirin once daily, will continue.  3.  Genetics -03/27/16 testing showedATM c.2606C>T VUS identified on the Breast/Gyn panel.Otherwise Negative.  4.Neuromyelitis optica spectrum disorder (NOSD) -She will continue follow up with her neurologist Dr. Moshe Cipro at [facility] has her Rituxan every 4 months at [facility].   5. H/o COVID19 pneumonia  -Antibodies detected on 01/18/19 and she tested  positive for COVID19 on 02/10/19.  -She required hospitalization due to double pneumonia. She has had residual effects on her lungs.  -She now has asthma, residual cough and some SOB. She is on inhaler as needed.  -She continues to f/u with her pulmonologist. She has received both her COVID19 vaccinations.   -I encouraged her to continue with vaccinations.    Plan -She is clinically doing well  -Mammogram in 05/2020  -Lab and F/u in 1 year    No problem-specific Assessment & Plan notes found for this encounter.   Orders Placed This Encounter  Procedures   MM DIAG BREAST TOMO BILATERAL    Standing Status:   Future    Standing Expiration Date:   10/06/2020    Order Specific Question:   Reason for Exam (SYMPTOM  OR DIAGNOSIS REQUIRED)    Answer:   SCREENING    Order Specific Question:   Is the patient pregnant?    Answer:   No    Order Specific Question:   Preferred imaging location?    Answer:   [facility]   All questions were answered. The patient knows to call the clinic with any problems, questions or concerns. No barriers to learning was detected. The total time spent in the appointment was 25 minutes.     Truitt Merle, MD 10/07/2019   I, Joslyn [facility], am acting as scribe for Truitt Merle, MD.   I have reviewed the above documentation for accuracy and completeness, and I agree with the above.

## 2019-10-06 ENCOUNTER — Other Ambulatory Visit: Payer: BC Managed Care – PPO

## 2019-10-06 ENCOUNTER — Ambulatory Visit: Payer: BC Managed Care – PPO | Admitting: Hematology

## 2019-10-07 ENCOUNTER — Other Ambulatory Visit: Payer: Self-pay

## 2019-10-07 ENCOUNTER — Inpatient Hospital Stay (HOSPITAL_BASED_OUTPATIENT_CLINIC_OR_DEPARTMENT_OTHER): Payer: BC Managed Care – PPO | Admitting: Hematology

## 2019-10-07 ENCOUNTER — Telehealth: Payer: Self-pay | Admitting: Hematology

## 2019-10-07 ENCOUNTER — Inpatient Hospital Stay: Payer: BC Managed Care – PPO | Attending: Hematology

## 2019-10-07 VITALS — BP 110/64 | HR 90 | Temp 98.5°F | Resp 18 | Wt 179.1 lb

## 2019-10-07 DIAGNOSIS — Z923 Personal history of irradiation: Secondary | ICD-10-CM | POA: Diagnosis not present

## 2019-10-07 DIAGNOSIS — Z7982 Long term (current) use of aspirin: Secondary | ICD-10-CM | POA: Diagnosis not present

## 2019-10-07 DIAGNOSIS — Z86718 Personal history of other venous thrombosis and embolism: Secondary | ICD-10-CM | POA: Diagnosis not present

## 2019-10-07 DIAGNOSIS — Z853 Personal history of malignant neoplasm of breast: Secondary | ICD-10-CM | POA: Diagnosis not present

## 2019-10-07 DIAGNOSIS — Z86711 Personal history of pulmonary embolism: Secondary | ICD-10-CM | POA: Insufficient documentation

## 2019-10-07 DIAGNOSIS — J45909 Unspecified asthma, uncomplicated: Secondary | ICD-10-CM | POA: Diagnosis not present

## 2019-10-07 DIAGNOSIS — Z8616 Personal history of COVID-19: Secondary | ICD-10-CM | POA: Insufficient documentation

## 2019-10-07 DIAGNOSIS — Z171 Estrogen receptor negative status [ER-]: Secondary | ICD-10-CM | POA: Diagnosis not present

## 2019-10-07 DIAGNOSIS — Z803 Family history of malignant neoplasm of breast: Secondary | ICD-10-CM | POA: Diagnosis not present

## 2019-10-07 DIAGNOSIS — Z9221 Personal history of antineoplastic chemotherapy: Secondary | ICD-10-CM | POA: Insufficient documentation

## 2019-10-07 DIAGNOSIS — Z79899 Other long term (current) drug therapy: Secondary | ICD-10-CM | POA: Insufficient documentation

## 2019-10-07 DIAGNOSIS — G360 Neuromyelitis optica [Devic]: Secondary | ICD-10-CM

## 2019-10-07 DIAGNOSIS — Z800 Family history of malignant neoplasm of digestive organs: Secondary | ICD-10-CM | POA: Diagnosis not present

## 2019-10-07 DIAGNOSIS — C50412 Malignant neoplasm of upper-outer quadrant of left female breast: Secondary | ICD-10-CM | POA: Diagnosis not present

## 2019-10-07 LAB — COMPREHENSIVE METABOLIC PANEL
ALT: 23 U/L (ref 0–44)
AST: 18 U/L (ref 15–41)
Albumin: 3.6 g/dL (ref 3.5–5.0)
Alkaline Phosphatase: 202 U/L — ABNORMAL HIGH (ref 38–126)
Anion gap: 11 (ref 5–15)
BUN: 15 mg/dL (ref 6–20)
CO2: 24 mmol/L (ref 22–32)
Calcium: 9.7 mg/dL (ref 8.9–10.3)
Chloride: 104 mmol/L (ref 98–111)
Creatinine, Ser: 0.86 mg/dL (ref 0.44–1.00)
GFR calc Af Amer: >60 mL/min (ref >60)
GFR calc non Af Amer: >60 mL/min (ref >60)
Glucose, Bld: 111 mg/dL — ABNORMAL HIGH (ref 70–99)
Potassium: 3.8 mmol/L (ref 3.5–5.1)
Sodium: 139 mmol/L (ref 135–145)
Total Bilirubin: 0.3 mg/dL (ref 0.3–1.2)
Total Protein: 6.8 g/dL (ref 6.5–8.1)

## 2019-10-07 LAB — CBC WITH DIFFERENTIAL/PLATELET
Abs Immature Granulocytes: 0.02 10*3/uL (ref 0.00–0.07)
Basophils Absolute: 0 10*3/uL (ref 0.0–0.1)
Basophils Relative: 0 %
Eosinophils Absolute: 0.1 10*3/uL (ref 0.0–0.5)
Eosinophils Relative: 1 %
HCT: 37.4 % (ref 36.0–46.0)
Hemoglobin: 11.9 g/dL — ABNORMAL LOW (ref 12.0–15.0)
Immature Granulocytes: 0 %
Lymphocytes Relative: 12 %
Lymphs Abs: 0.7 10*3/uL (ref 0.7–4.0)
MCH: 26.7 pg (ref 26.0–34.0)
MCHC: 31.8 g/dL (ref 30.0–36.0)
MCV: 84 fL (ref 80.0–100.0)
Monocytes Absolute: 0.4 10*3/uL (ref 0.1–1.0)
Monocytes Relative: 6 %
Neutro Abs: 4.7 10*3/uL (ref 1.7–7.7)
Neutrophils Relative %: 81 %
Platelets: 218 10*3/uL (ref 150–400)
RBC: 4.45 MIL/uL (ref 3.87–5.11)
RDW: 14.7 % (ref 11.5–15.5)
WBC: 6 10*3/uL (ref 4.0–10.5)
nRBC: 0 % (ref 0.0–0.2)

## 2019-10-07 NOTE — Telephone Encounter (Signed)
Scheduled per 8/6 los. No avs or calendar needed to be printed. Pt is mychart active.

## 2019-10-09 ENCOUNTER — Encounter: Payer: Self-pay | Admitting: Hematology

## 2019-10-25 DIAGNOSIS — Z20828 Contact with and (suspected) exposure to other viral communicable diseases: Secondary | ICD-10-CM | POA: Diagnosis not present

## 2019-11-15 DIAGNOSIS — G360 Neuromyelitis optica [Devic]: Secondary | ICD-10-CM | POA: Diagnosis not present

## 2019-11-15 DIAGNOSIS — Z79899 Other long term (current) drug therapy: Secondary | ICD-10-CM | POA: Diagnosis not present

## 2019-11-15 DIAGNOSIS — Z23 Encounter for immunization: Secondary | ICD-10-CM | POA: Diagnosis not present

## 2019-11-15 DIAGNOSIS — M792 Neuralgia and neuritis, unspecified: Secondary | ICD-10-CM | POA: Diagnosis not present

## 2019-11-24 DIAGNOSIS — N390 Urinary tract infection, site not specified: Secondary | ICD-10-CM | POA: Diagnosis not present

## 2019-12-05 DIAGNOSIS — Z1331 Encounter for screening for depression: Secondary | ICD-10-CM | POA: Diagnosis not present

## 2019-12-05 DIAGNOSIS — B373 Candidiasis of vulva and vagina: Secondary | ICD-10-CM | POA: Diagnosis not present

## 2019-12-05 DIAGNOSIS — R300 Dysuria: Secondary | ICD-10-CM | POA: Diagnosis not present

## 2019-12-05 DIAGNOSIS — E7489 Other specified disorders of carbohydrate metabolism: Secondary | ICD-10-CM | POA: Diagnosis not present

## 2019-12-05 DIAGNOSIS — G620 Drug-induced polyneuropathy: Secondary | ICD-10-CM | POA: Diagnosis not present

## 2019-12-05 DIAGNOSIS — Z6830 Body mass index (BMI) 30.0-30.9, adult: Secondary | ICD-10-CM | POA: Diagnosis not present

## 2019-12-30 DIAGNOSIS — G360 Neuromyelitis optica [Devic]: Secondary | ICD-10-CM | POA: Diagnosis not present

## 2020-01-24 DIAGNOSIS — J069 Acute upper respiratory infection, unspecified: Secondary | ICD-10-CM | POA: Diagnosis not present

## 2020-01-27 ENCOUNTER — Emergency Department (HOSPITAL_COMMUNITY): Payer: BC Managed Care – PPO

## 2020-01-27 ENCOUNTER — Observation Stay (HOSPITAL_BASED_OUTPATIENT_CLINIC_OR_DEPARTMENT_OTHER): Payer: BC Managed Care – PPO

## 2020-01-27 ENCOUNTER — Encounter (HOSPITAL_COMMUNITY): Payer: Self-pay | Admitting: Emergency Medicine

## 2020-01-27 ENCOUNTER — Observation Stay (HOSPITAL_COMMUNITY): Payer: BC Managed Care – PPO

## 2020-01-27 ENCOUNTER — Other Ambulatory Visit: Payer: Self-pay

## 2020-01-27 ENCOUNTER — Observation Stay (HOSPITAL_COMMUNITY)
Admission: EM | Admit: 2020-01-27 | Discharge: 2020-01-28 | Disposition: A | Discharge status: Home / Self Care | Payer: BC Managed Care – PPO | Attending: Family Medicine | Admitting: Family Medicine

## 2020-01-27 DIAGNOSIS — Z20822 Contact with and (suspected) exposure to covid-19: Secondary | ICD-10-CM | POA: Insufficient documentation

## 2020-01-27 DIAGNOSIS — Z87891 Personal history of nicotine dependence: Secondary | ICD-10-CM | POA: Insufficient documentation

## 2020-01-27 DIAGNOSIS — Z7982 Long term (current) use of aspirin: Secondary | ICD-10-CM | POA: Diagnosis not present

## 2020-01-27 DIAGNOSIS — J84116 Cryptogenic organizing pneumonia: Secondary | ICD-10-CM | POA: Diagnosis not present

## 2020-01-27 DIAGNOSIS — Z7951 Long term (current) use of inhaled steroids: Secondary | ICD-10-CM | POA: Insufficient documentation

## 2020-01-27 DIAGNOSIS — R0602 Shortness of breath: Secondary | ICD-10-CM

## 2020-01-27 DIAGNOSIS — Z853 Personal history of malignant neoplasm of breast: Secondary | ICD-10-CM | POA: Diagnosis not present

## 2020-01-27 DIAGNOSIS — J189 Pneumonia, unspecified organism: Principal | ICD-10-CM | POA: Insufficient documentation

## 2020-01-27 DIAGNOSIS — R42 Dizziness and giddiness: Secondary | ICD-10-CM | POA: Diagnosis not present

## 2020-01-27 DIAGNOSIS — R059 Cough, unspecified: Secondary | ICD-10-CM | POA: Diagnosis not present

## 2020-01-27 DIAGNOSIS — R0902 Hypoxemia: Secondary | ICD-10-CM | POA: Diagnosis not present

## 2020-01-27 DIAGNOSIS — J069 Acute upper respiratory infection, unspecified: Secondary | ICD-10-CM | POA: Diagnosis not present

## 2020-01-27 DIAGNOSIS — R0600 Dyspnea, unspecified: Secondary | ICD-10-CM | POA: Diagnosis not present

## 2020-01-27 DIAGNOSIS — J9601 Acute respiratory failure with hypoxia: Secondary | ICD-10-CM | POA: Diagnosis not present

## 2020-01-27 LAB — CBC WITH DIFFERENTIAL/PLATELET
Abs Immature Granulocytes: 0.03 10*3/uL (ref 0.00–0.07)
Basophils Absolute: 0 10*3/uL (ref 0.0–0.1)
Basophils Relative: 0 %
Eosinophils Absolute: 0 10*3/uL (ref 0.0–0.5)
Eosinophils Relative: 1 %
HCT: 35.4 % — ABNORMAL LOW (ref 36.0–46.0)
Hemoglobin: 11.3 g/dL — ABNORMAL LOW (ref 12.0–15.0)
Immature Granulocytes: 1 %
Lymphocytes Relative: 9 %
Lymphs Abs: 0.2 10*3/uL — ABNORMAL LOW (ref 0.7–4.0)
MCH: 28.1 pg (ref 26.0–34.0)
MCHC: 31.9 g/dL (ref 30.0–36.0)
MCV: 88.1 fL (ref 80.0–100.0)
Monocytes Absolute: 0.2 10*3/uL (ref 0.1–1.0)
Monocytes Relative: 8 %
Neutro Abs: 2.1 10*3/uL (ref 1.7–7.7)
Neutrophils Relative %: 81 %
Platelets: 279 10*3/uL (ref 150–400)
RBC: 4.02 MIL/uL (ref 3.87–5.11)
RDW: 13.9 % (ref 11.5–15.5)
WBC: 2.6 10*3/uL — ABNORMAL LOW (ref 4.0–10.5)
nRBC: 0 % (ref 0.0–0.2)

## 2020-01-27 LAB — C-REACTIVE PROTEIN: CRP: 7.7 mg/dL — ABNORMAL HIGH (ref <1.0)

## 2020-01-27 LAB — ECHOCARDIOGRAM COMPLETE
Area-P 1/2: 3.61 cm2
Height: 63 in
S' Lateral: 2.8 cm
Weight: 2878.33 oz

## 2020-01-27 LAB — PROCALCITONIN: Procalcitonin: <0.1 ng/mL

## 2020-01-27 LAB — COMPREHENSIVE METABOLIC PANEL
ALT: 58 U/L — ABNORMAL HIGH (ref 0–44)
AST: 45 U/L — ABNORMAL HIGH (ref 15–41)
Albumin: 2.9 g/dL — ABNORMAL LOW (ref 3.5–5.0)
Alkaline Phosphatase: 472 U/L — ABNORMAL HIGH (ref 38–126)
Anion gap: 10 (ref 5–15)
BUN: 12 mg/dL (ref 6–20)
CO2: 23 mmol/L (ref 22–32)
Calcium: 8.4 mg/dL — ABNORMAL LOW (ref 8.9–10.3)
Chloride: 103 mmol/L (ref 98–111)
Creatinine, Ser: 0.55 mg/dL (ref 0.44–1.00)
GFR, Estimated: >60 mL/min (ref >60)
Glucose, Bld: 114 mg/dL — ABNORMAL HIGH (ref 70–99)
Potassium: 3.7 mmol/L (ref 3.5–5.1)
Sodium: 136 mmol/L (ref 135–145)
Total Bilirubin: 0.3 mg/dL (ref 0.3–1.2)
Total Protein: 6.3 g/dL — ABNORMAL LOW (ref 6.5–8.1)

## 2020-01-27 LAB — RESP PANEL BY RT-PCR (FLU A&B, COVID) ARPGX2
Influenza A by PCR: NEGATIVE
Influenza B by PCR: NEGATIVE
SARS Coronavirus 2 by RT PCR: NEGATIVE

## 2020-01-27 LAB — TRIGLYCERIDES: Triglycerides: 117 mg/dL (ref <150)

## 2020-01-27 LAB — FERRITIN: Ferritin: 698 ng/mL — ABNORMAL HIGH (ref 11–307)

## 2020-01-27 LAB — TROPONIN I (HIGH SENSITIVITY)
Troponin I (High Sensitivity): 8 ng/L (ref <18)
Troponin I (High Sensitivity): 8 ng/L (ref <18)

## 2020-01-27 LAB — BRAIN NATRIURETIC PEPTIDE: B Natriuretic Peptide: 34 pg/mL (ref 0.0–100.0)

## 2020-01-27 LAB — MRSA PCR SCREENING: MRSA by PCR: NEGATIVE

## 2020-01-27 LAB — LACTIC ACID, PLASMA
Lactic Acid, Venous: 1.1 mmol/L (ref 0.5–1.9)
Lactic Acid, Venous: 1.1 mmol/L (ref 0.5–1.9)

## 2020-01-27 LAB — FIBRINOGEN: Fibrinogen: >800 mg/dL — ABNORMAL HIGH (ref 210–475)

## 2020-01-27 LAB — D-DIMER, QUANTITATIVE: D-Dimer, Quant: 0.36 ug/mL-FEU (ref 0.00–0.50)

## 2020-01-27 LAB — STREP PNEUMONIAE URINARY ANTIGEN: Strep Pneumo Urinary Antigen: NEGATIVE

## 2020-01-27 LAB — LACTATE DEHYDROGENASE: LDH: 170 U/L (ref 98–192)

## 2020-01-27 MED ORDER — ASPIRIN EC 81 MG PO TBEC
81.0000 mg | DELAYED_RELEASE_TABLET | Freq: Every day | ORAL | Status: DC
  Administered 2020-01-27 – 2020-01-28 (×2): 81 mg via ORAL
  Filled 2020-01-27 (×2): qty 1

## 2020-01-27 MED ORDER — VITAMIN D 25 MCG (1000 UNIT) PO TABS: 3000.0000 [IU] | ORAL_TABLET | Freq: Every day | ORAL | Status: DC

## 2020-01-27 MED ORDER — IPRATROPIUM-ALBUTEROL 0.5-2.5 (3) MG/3ML IN SOLN
3.0000 mL | Freq: Three times a day (TID) | RESPIRATORY_TRACT | Status: DC
  Administered 2020-01-28: 3 mL via RESPIRATORY_TRACT
  Filled 2020-01-27: qty 3

## 2020-01-27 MED ORDER — SODIUM CHLORIDE 0.9% FLUSH
3.0000 mL | Freq: Two times a day (BID) | INTRAVENOUS | Status: DC
  Administered 2020-01-27 (×2): 3 mL via INTRAVENOUS

## 2020-01-27 MED ORDER — SODIUM CHLORIDE 0.9% FLUSH: 3.0000 mL | INTRAVENOUS | Status: DC | PRN

## 2020-01-27 MED ORDER — ASCORBIC ACID 500 MG PO TABS
500.0000 mg | ORAL_TABLET | Freq: Every day | ORAL | Status: DC
  Administered 2020-01-27 – 2020-01-28 (×2): 500 mg via ORAL
  Filled 2020-01-27 (×2): qty 1

## 2020-01-27 MED ORDER — SODIUM CHLORIDE 0.9 % IV BOLUS
1000.0000 mL | Freq: Once | INTRAVENOUS | Status: AC
  Administered 2020-01-27: 1000 mL via INTRAVENOUS

## 2020-01-27 MED ORDER — ONDANSETRON HCL 4 MG PO TABS: 4.0000 mg | ORAL_TABLET | Freq: Four times a day (QID) | ORAL | Status: DC | PRN

## 2020-01-27 MED ORDER — GABAPENTIN 300 MG PO CAPS
300.0000 mg | ORAL_CAPSULE | Freq: Every day | ORAL | Status: DC
  Administered 2020-01-27: 300 mg via ORAL
  Filled 2020-01-27: qty 1

## 2020-01-27 MED ORDER — ONDANSETRON HCL 4 MG/2ML IJ SOLN: 4.0000 mg | Freq: Four times a day (QID) | INTRAMUSCULAR | Status: DC | PRN

## 2020-01-27 MED ORDER — ACETAMINOPHEN 325 MG PO TABS: 650.0000 mg | ORAL_TABLET | Freq: Four times a day (QID) | ORAL | Status: DC | PRN

## 2020-01-27 MED ORDER — ACETAMINOPHEN 650 MG RE SUPP: 650.0000 mg | Freq: Four times a day (QID) | RECTAL | Status: DC | PRN

## 2020-01-27 MED ORDER — SODIUM CHLORIDE 0.9 % IV SOLN: 250.0000 mL | INTRAVENOUS | Status: DC | PRN

## 2020-01-27 MED ORDER — VANCOMYCIN HCL 1250 MG/250ML IV SOLN
1250.0000 mg | INTRAVENOUS | Status: DC
  Administered 2020-01-28: 1250 mg via INTRAVENOUS
  Filled 2020-01-27 (×2): qty 250

## 2020-01-27 MED ORDER — LEVOFLOXACIN IN D5W 750 MG/150ML IV SOLN
750.0000 mg | INTRAVENOUS | Status: DC
  Administered 2020-01-28: 750 mg via INTRAVENOUS
  Filled 2020-01-27 (×2): qty 150

## 2020-01-27 MED ORDER — GUAIFENESIN ER 600 MG PO TB12
600.0000 mg | ORAL_TABLET | Freq: Two times a day (BID) | ORAL | Status: DC
  Administered 2020-01-27 – 2020-01-28 (×3): 600 mg via ORAL
  Filled 2020-01-27 (×3): qty 1

## 2020-01-27 MED ORDER — FUROSEMIDE 20 MG PO TABS
20.0000 mg | ORAL_TABLET | Freq: Every day | ORAL | Status: DC
  Administered 2020-01-27 – 2020-01-28 (×2): 20 mg via ORAL
  Filled 2020-01-27 (×2): qty 1

## 2020-01-27 MED ORDER — METHYLPREDNISOLONE SODIUM SUCC 40 MG IJ SOLR
40.0000 mg | Freq: Four times a day (QID) | INTRAMUSCULAR | Status: DC
  Administered 2020-01-27 – 2020-01-28 (×4): 40 mg via INTRAVENOUS
  Filled 2020-01-27 (×4): qty 1

## 2020-01-27 MED ORDER — LEVOFLOXACIN IN D5W 750 MG/150ML IV SOLN
750.0000 mg | Freq: Once | INTRAVENOUS | Status: AC
  Administered 2020-01-27: 750 mg via INTRAVENOUS
  Filled 2020-01-27: qty 150

## 2020-01-27 MED ORDER — VANCOMYCIN HCL 1500 MG/300ML IV SOLN
1500.0000 mg | Freq: Once | INTRAVENOUS | Status: AC
  Administered 2020-01-27: 1500 mg via INTRAVENOUS
  Filled 2020-01-27: qty 300

## 2020-01-27 MED ORDER — IPRATROPIUM-ALBUTEROL 0.5-2.5 (3) MG/3ML IN SOLN
3.0000 mL | Freq: Four times a day (QID) | RESPIRATORY_TRACT | Status: DC
  Administered 2020-01-27 (×2): 3 mL via RESPIRATORY_TRACT
  Filled 2020-01-27 (×2): qty 3

## 2020-01-27 MED ORDER — CALCIUM CARBONATE ANTACID 500 MG PO CHEW
400.0000 mg | CHEWABLE_TABLET | Freq: Three times a day (TID) | ORAL | Status: DC
  Administered 2020-01-27 – 2020-01-28 (×2): 400 mg via ORAL
  Filled 2020-01-27 (×2): qty 2

## 2020-01-27 MED ORDER — ALBUTEROL SULFATE (2.5 MG/3ML) 0.083% IN NEBU: 2.5000 mg | INHALATION_SOLUTION | Freq: Four times a day (QID) | RESPIRATORY_TRACT | Status: DC

## 2020-01-27 MED ORDER — IPRATROPIUM-ALBUTEROL 0.5-2.5 (3) MG/3ML IN SOLN: 3.0000 mL | Freq: Four times a day (QID) | RESPIRATORY_TRACT | Status: DC | PRN

## 2020-01-27 MED ORDER — IPRATROPIUM BROMIDE 0.02 % IN SOLN: 0.5000 mg | Freq: Four times a day (QID) | RESPIRATORY_TRACT | Status: DC

## 2020-01-27 MED ORDER — CALCIUM CARBONATE ANTACID 500 MG PO CHEW: 1.0000 | CHEWABLE_TABLET | Freq: Three times a day (TID) | ORAL | Status: DC

## 2020-01-27 MED ORDER — ENOXAPARIN SODIUM 40 MG/0.4ML ~~LOC~~ SOLN
40.0000 mg | SUBCUTANEOUS | Status: DC
  Administered 2020-01-27: 40 mg via SUBCUTANEOUS
  Filled 2020-01-27: qty 0.4

## 2020-01-27 MED ORDER — VITAMIN D 25 MCG (1000 UNIT) PO TABS
5000.0000 [IU] | ORAL_TABLET | Freq: Every day | ORAL | Status: DC
  Administered 2020-01-27 – 2020-01-28 (×2): 5000 [IU] via ORAL
  Filled 2020-01-27 (×2): qty 5

## 2020-01-27 NOTE — H&P (Signed)
TRH H&P    Patient Demographics:    Grace Moyer, is a 59 y.o. female  MRN: [id number]  DOB - 1960-05-31  Admit Date - 01/27/2020  Referring MD/NP/PA: Dr. Tomi Bamberger  Outpatient Primary MD for the patient is Sharilyn Sites, MD  Patient coming from: Home  Chief complaint-shortness of breath   HPI:    Grace Moyer  is a 59 y.o. 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  Patient says that she was having fever at home also having chills.  She was seen by her PCP and was prescribed Augmentin on Monday.  Patient symptoms did not improve so she came to the hospital.  She also had diarrhea a few days ago which she had 2 episodes of stool that day.  Patient also had poor appetite, not eating and drinking well.  Patient said that she had Covid pneumonia last year in December, at that time she was in the hospital for 4 days and got better and was discharged home.  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  She also has neuromyelitis optica and gets Rituxan every 4 months.  Her last dose was December 30, 2019.  In the ED patient was found to be hypoxic with O2 sats 89% on room air, tachypnea. Chest x-ray showed shallow inspiration with atelectasis or infiltrates in both lung bases.  Possible multifocal pneumonia.    Review of systems:    In addition to the HPI above,  .  All other systems reviewed and are negative.    Past History of the following :    Past Medical History:  Diagnosis Date  . Anxiety   . Breast cancer ([place])   . Family history of breast cancer   . Family history of colon cancer   . Malignant neoplasm of upper-outer quadrant of left female breast ([place]) 01/31/2016  . Neuromyelitis  optica ([place])   . Personal history of chemotherapy   . Personal history of radiation therapy       Past Surgical History:  Procedure Laterality Date  . ABDOMINAL HYSTERECTOMY     partial  . BREAST BIOPSY Left 2017  . BREAST LUMPECTOMY Left    2018  . BREAST LUMPECTOMY WITH RADIOACTIVE SEED AND SENTINEL LYMPH NODE BIOPSY Left 07/31/2016   Procedure: LEFT BREAST RADIOACTIVE SEED GUIDED LUMPECTOMY WITH LEFT RADIOACTIVE SEED TARGETED AXILLARY SENTINEL LYMPH NODE EXCISION AND SENTINEL LYMPH NODE BIOPSY;  Surgeon: Rolm Bookbinder, MD;  Location: [place];  Service: General;  Laterality: Left;  . BREAST REDUCTION SURGERY Bilateral 08/05/2016   Procedure: LEFT ONCOPLASTIC REDUCTION; RIGHT BREAST REDUCTION;  Surgeon: Irene Limbo, MD;  Location: [place] [facility];  Service: Plastics;  Laterality: Bilateral;  . EYE SURGERY    . PORT-A-CATH REMOVAL Right 07/31/2016   Procedure: REMOVAL PORT-A-CATH;  Surgeon: Rolm Bookbinder, MD;  Location: [facility];  Service:  General;  Laterality: Right;  . PORTACATH PLACEMENT Right 02/11/2016   Procedure: INSERTION PORT-A-CATH WITH [place];  Surgeon: Rolm Bookbinder, MD;  Location: [facility];  Service: General;  Laterality: Right;  . REDUCTION MAMMAPLASTY        Social History:      Social History   Tobacco Use  . Smoking status: Former Smoker    Types: Cigarettes    Quit date: 09/03/2001    Years since quitting: 18.4  . Smokeless tobacco: Never Used  Substance Use Topics  . Alcohol use: Yes    Comment: social       Family History :     Family History  Problem Relation Age of Onset  . Breast cancer Maternal Grandmother        dx in her 57s  . CAD Paternal Grandmother   . Colon cancer Paternal Uncle        dx in his 55s  . COPD Maternal Grandfather   . CAD Paternal Grandfather   . Breast cancer Cousin        dx in her early to mid 28s; maternal first cousin  . Lung cancer  Paternal Uncle       Home Medications:   Prior to Admission medications   Medication Sig Start Date End Date Taking? Authorizing Provider  albuterol (VENTOLIN HFA) 108 (90 Base) MCG/ACT inhaler Inhale 2 puffs into the lungs every 4 (four) hours as needed for wheezing or shortness of breath. 09/01/19  Yes Mannam, Praveen, MD  aspirin 81 MG tablet Take 1 tablet (81 mg total) by mouth daily. 03/30/17  Yes Magrinat, Virgie Dad, MD  Cholecalciferol (VITAMIN D-1000 MAX ST) 1000 units tablet Take 5,000 Units by mouth daily.    Yes [provider]  fluticasone furoate-vilanterol (BREO ELLIPTA) 200-25 MCG/INH AEPB Inhale 1 puff into the lungs daily. 09/20/19  Yes Mannam, Praveen, MD  gabapentin (NEURONTIN) 300 MG capsule Take 300 mg by mouth at bedtime.   Yes [provider]  vitamin C (VITAMIN C) 500 MG tablet Take 1 tablet (500 mg total) by mouth daily. 02/14/19  Yes Barton Dubois, MD  VITAMIN D PO Take 3,000 Units by mouth daily.   Yes [provider]  zinc sulfate 220 (50 Zn) MG capsule Take 1 capsule (220 mg total) by mouth daily. Patient taking differently: Take 50 mg by mouth daily.  02/14/19  Yes Barton Dubois, MD  amoxicillin-clavulanate (AUGMENTIN) 875-125 MG tablet Take 1 tablet by mouth 2 (two) times daily. 01/24/20   [provider]  benzonatate (TESSALON) 200 MG capsule Take 200 mg by mouth in the morning, at noon, and at bedtime. 01/24/20   [provider]  pantoprazole (PROTONIX) 40 MG tablet Take 1 tablet (40 mg total) by mouth daily. 02/13/19 02/27/19  Barton Dubois, MD     Allergies:     Allergies  Allergen Reactions  . Hydrocodone Other (See Comments)    Did not help with pain and kept her up all night     Physical Exam:   Vitals  Blood pressure 122/73, pulse 79, temperature 98.5 F (36.9 C), temperature source Oral, resp. rate (!) 33, height 5\' 3"  (1.6 m), weight 81.6 kg, SpO2 98 %.  1.  General: Appears in no acute  distress  2. Psychiatric: Alert, oriented x3, intact insight and judgment  3. Neurologic: Cranial nerves II through XII grossly intact, no focal deficit noted  4. HEENMT:  Atraumatic normocephalic, extraocular muscles are intact  5. Respiratory : Bibasilar crackles auscultated  6. Cardiovascular : S1-S2, regular, no murmur auscultated  7. Gastrointestinal:  Abdominal soft, nontender, no organomegaly  8. Skin:  No rashes noted     Data Review:    CBC Recent Labs  Lab 01/27/20 0544  WBC 2.6*  HGB 11.3*  HCT 35.4*  PLT 279  MCV 88.1  MCH 28.1  MCHC 31.9  RDW 13.9  LYMPHSABS 0.2*  MONOABS 0.2  EOSABS 0.0  BASOSABS 0.0   ------------------------------------------------------------------------------------------------------------------  Results for orders placed or performed during the hospital encounter of 01/27/20 (from the past 48 hour(s))  Resp Panel by RT-PCR (Flu A&B, Covid) Nasopharyngeal Swab     Status: None   Collection Time: 01/27/20  4:03 AM   Specimen: Nasopharyngeal Swab; Nasopharyngeal(NP) swabs in vial transport medium  Result Value Ref Range   SARS Coronavirus 2 by RT PCR NEGATIVE NEGATIVE    Comment: (NOTE) SARS-CoV-2 target nucleic acids are NOT DETECTED.  The SARS-CoV-2 RNA is generally detectable in upper respiratory specimens during the acute phase of infection. The lowest concentration of SARS-CoV-2 viral copies this assay can detect is 138 copies/mL. A negative result does not preclude SARS-Cov-2 infection and should not be used as the sole basis for treatment or other patient management decisions. A negative result may occur with  improper specimen collection/handling, submission of specimen other than nasopharyngeal swab, presence of viral mutation(s) within the areas targeted by this assay, and inadequate number of viral copies(<138 copies/mL). A negative result must be combined with clinical observations, patient history, and  epidemiological information. The expected result is Negative.  Fact Sheet for Patients:  EntrepreneurPulse.com.au  Fact Sheet for Healthcare Providers:  IncredibleEmployment.be  This test is no t yet approved or cleared by the [place] FDA and  has been authorized for detection and/or diagnosis of SARS-CoV-2 by FDA under an Emergency Use Authorization (EUA). This EUA will remain  in effect (meaning this test can be used) for the duration of the COVID-19 declaration under Section 564(b)(1) of the Act, 21 U.S.C.section 360bbb-3(b)(1), unless the authorization is terminated  or revoked sooner.       Influenza A by PCR NEGATIVE NEGATIVE   Influenza B by PCR NEGATIVE NEGATIVE    Comment: (NOTE) The Xpert Xpress SARS-CoV-2/FLU/RSV plus assay is intended as an aid in the diagnosis of influenza from Nasopharyngeal swab specimens and should not be used as a sole basis for treatment. Nasal washings and aspirates are unacceptable for Xpert Xpress SARS-CoV-2/FLU/RSV testing.  Fact Sheet for Patients: EntrepreneurPulse.com.au  Fact Sheet for Healthcare Providers: IncredibleEmployment.be  This test is not yet approved or cleared by the [place] FDA and has been authorized for detection and/or diagnosis of SARS-CoV-2 by FDA under an Emergency Use Authorization (EUA). This EUA will remain in effect (meaning this test can be used) for the duration of the COVID-19 declaration under Section 564(b)(1) of the Act, 21 U.S.C. section 360bbb-3(b)(1), unless the authorization is terminated or revoked.  Performed at [facility], [address] [place], [place] [address]   Lactic acid, plasma     Status: None   Collection Time: 01/27/20  5:44 AM  Result Value Ref Range   Lactic Acid, Venous 1.1 0.5 - 1.9 mmol/L    Comment: Performed at [facility], [address] [place], [place] [address]  CBC WITH DIFFERENTIAL      Status: Abnormal   Collection Time: 01/27/20  5:44 AM  Result Value Ref Range   WBC 2.6 (  L) 4.0 - 10.5 K/uL   RBC 4.02 3.87 - 5.11 MIL/uL   Hemoglobin 11.3 (L) 12.0 - 15.0 g/dL   HCT 35.4 (L) 36 - 46 %   MCV 88.1 80.0 - 100.0 fL   MCH 28.1 26.0 - 34.0 pg   MCHC 31.9 30.0 - 36.0 g/dL   RDW 13.9 11.5 - 15.5 %   Platelets 279 150 - 400 K/uL   nRBC 0.0 0.0 - 0.2 %   Neutrophils Relative % 81 %   Neutro Abs 2.1 1.7 - 7.7 K/uL   Lymphocytes Relative 9 %   Lymphs Abs 0.2 (L) 0.7 - 4.0 K/uL   Monocytes Relative 8 %   Monocytes Absolute 0.2 0.1 - 1.0 K/uL   Eosinophils Relative 1 %   Eosinophils Absolute 0.0 0.0 - 0.5 K/uL   Basophils Relative 0 %   Basophils Absolute 0.0 0.0 - 0.1 K/uL   Immature Granulocytes 1 %   Abs Immature Granulocytes 0.03 0.00 - 0.07 K/uL    Comment: Performed at [facility], [address], [place], [place] [address]  Comprehensive metabolic panel     Status: Abnormal   Collection Time: 01/27/20  5:44 AM  Result Value Ref Range   Sodium 136 135 - 145 mmol/L   Potassium 3.7 3.5 - 5.1 mmol/L   Chloride 103 98 - 111 mmol/L   CO2 23 22 - 32 mmol/L   Glucose, Bld 114 (H) 70 - 99 mg/dL    Comment: Glucose reference range applies only to samples taken after fasting for at least 8 hours.   BUN 12 6 - 20 mg/dL   Creatinine, Ser 0.55 0.44 - 1.00 mg/dL   Calcium 8.4 (L) 8.9 - 10.3 mg/dL   Total Protein 6.3 (L) 6.5 - 8.1 g/dL   Albumin 2.9 (L) 3.5 - 5.0 g/dL   AST 45 (H) 15 - 41 U/L   ALT 58 (H) 0 - 44 U/L   Alkaline Phosphatase 472 (H) 38 - 126 U/L   Total Bilirubin 0.3 0.3 - 1.2 mg/dL   GFR, Estimated >60 >60 mL/min    Comment: (NOTE) Calculated using the CKD-EPI Creatinine Equation (2021)    Anion gap 10 5 - 15    Comment: Performed at [facility], [address], [place], [place] [address]  D-dimer, quantitative     Status: None   Collection Time: 01/27/20  5:44 AM  Result Value Ref Range   D-Dimer, Quant 0.36 0.00 - 0.50 ug/mL-FEU    Comment:  (NOTE) At the manufacturer cut-off value of 0.5 g/mL FEU, this assay has a negative predictive value of 95-100%.This assay is intended for use in conjunction with a clinical pretest probability (PTP) assessment model to exclude pulmonary embolism (PE) and deep venous thrombosis (DVT) in outpatients suspected of PE or DVT. Results should be correlated with clinical presentation. Performed at [facility], [address] [place],  [address]   Procalcitonin     Status: None   Collection Time: 01/27/20  5:44 AM  Result Value Ref Range   Procalcitonin <0.10 ng/mL    Comment:        Interpretation: PCT (Procalcitonin) <= 0.5 ng/mL: Systemic infection (sepsis) is not likely. Local bacterial infection is possible. (NOTE)       Sepsis PCT Algorithm           Lower Respiratory Tract  Infection PCT Algorithm    ----------------------------     ----------------------------         PCT < 0.25 ng/mL                PCT < 0.10 ng/mL          Strongly encourage             Strongly discourage   discontinuation of antibiotics    initiation of antibiotics    ----------------------------     -----------------------------       PCT 0.25 - 0.50 ng/mL            PCT 0.10 - 0.25 ng/mL               OR       >80% decrease in PCT            Discourage initiation of                                            antibiotics      Encourage discontinuation           of antibiotics    ----------------------------     -----------------------------         PCT >= 0.50 ng/mL              PCT 0.26 - 0.50 ng/mL               AND        <80% decrease in PCT             Encourage initiation of                                             antibiotics       Encourage continuation           of antibiotics    ----------------------------     -----------------------------        PCT >= 0.50 ng/mL                  PCT > 0.50 ng/mL               AND         increase in PCT                   Strongly encourage                                      initiation of antibiotics    Strongly encourage escalation           of antibiotics                                     -----------------------------                                           PCT <= 0.25 ng/mL  OR                                        > 80% decrease in PCT                                      Discontinue / Do not initiate                                             antibiotics  Performed at [facility], [address], [place], [place] [address]   Lactate dehydrogenase     Status: None   Collection Time: 01/27/20  5:44 AM  Result Value Ref Range   LDH 170 98 - 192 U/L    Comment: Performed at [facility], [address] [place], [place] [address]  Ferritin     Status: Abnormal   Collection Time: 01/27/20  5:44 AM  Result Value Ref Range   Ferritin 698 (H) 11 - 307 ng/mL    Comment: Performed at [facility], [address], [place], [place] [address]  Triglycerides     Status: None   Collection Time: 01/27/20  5:44 AM  Result Value Ref Range   Triglycerides 117 <150 mg/dL    Comment: Performed at [facility], [address] [place], [place] [address]  Fibrinogen     Status: Abnormal   Collection Time: 01/27/20  5:44 AM  Result Value Ref Range   Fibrinogen >800 (H) 210 - 475 mg/dL    Comment: Performed at [facility], [address] [place], [place] [address]  C-reactive protein     Status: Abnormal   Collection Time: 01/27/20  5:44 AM  Result Value Ref Range   CRP 7.7 (H) <1.0 mg/dL    Comment: Performed at [facility], [address], [place], [place] [address]  Brain natriuretic peptide     Status: None   Collection Time: 01/27/20  5:44 AM  Result Value Ref Range   B Natriuretic Peptide 34.0 0.0 - 100.0 pg/mL    Comment: Performed at [facility], [address], [place], [place] [address]  Troponin I (High Sensitivity)     Status: None    Collection Time: 01/27/20  5:44 AM  Result Value Ref Range   Troponin I (High Sensitivity) 8 <18 ng/L    Comment: (NOTE) Elevated high sensitivity troponin I (hsTnI) values and significant  changes across serial measurements may suggest ACS but many other  chronic and acute conditions are known to elevate hsTnI results.  Refer to the "Links" section for chest pain algorithms and additional  guidance. Performed at [facility], [address], [place], [place] [address]   Lactic acid, plasma     Status: None   Collection Time: 01/27/20  7:51 AM  Result Value Ref Range   Lactic Acid, Venous 1.1 0.5 - 1.9 mmol/L    Comment: Performed at [facility], [address] [place], [place] [address]    Chemistries  Recent Labs  Lab 01/27/20 0544  NA 136  K 3.7  CL 103  CO2 23  GLUCOSE 114*  BUN 12  CREATININE 0.55  CALCIUM 8.4*  AST 45*  ALT 58*  ALKPHOS 472*  BILITOT 0.3   ------------------------------------------------------------------------------------------------------------------  ------------------------------------------------------------------------------------------------------------------ GFR: Estimated Creatinine Clearance: 77.6 mL/min (by C-G formula based on SCr of 0.55 mg/dL). Liver Function Tests: Recent Labs  Lab 01/27/20 0544  AST 45*  ALT 58*  ALKPHOS 472*  BILITOT 0.3  PROT 6.3*  ALBUMIN 2.9*   Lipid Profile: Recent Labs    01/27/20 0544  TRIG 117   Anemia Panel: Recent Labs    01/27/20 0544  FERRITIN 698*    --------------------------------------------------------------------------------------------------------------- Urine analysis:    Component Value Date/Time   COLORURINE YELLOW 02/11/2019 0025   APPEARANCEUR CLEAR 02/11/2019 0025   LABSPEC 1.020 02/11/2019 0025   PHURINE 6.0 02/11/2019 0025   GLUCOSEU NEGATIVE 02/11/2019 0025   HGBUR NEGATIVE 02/11/2019 0025   BILIRUBINUR NEGATIVE 02/11/2019 0025   KETONESUR 20 (A) 02/11/2019  0025   PROTEINUR NEGATIVE 02/11/2019 0025   UROBILINOGEN 0.2 04/08/2007 0620   NITRITE NEGATIVE 02/11/2019 0025   LEUKOCYTESUR NEGATIVE 02/11/2019 0025      Imaging Results:    DG Chest Port 1 View  Result Date: 01/27/2020 CLINICAL DATA:  Cough, shortness of breath, and fever. Currently on antibiotics for upper respiratory infection not getting any better. EXAM: PORTABLE CHEST 1 VIEW COMPARISON:  03/22/2019 FINDINGS: Shallow inspiration with atelectasis or infiltration in both lung bases. Possibly multifocal pneumonia. No pleural effusions. No pneumothorax. Heart size and pulmonary vascularity are normal. IMPRESSION: Shallow inspiration with atelectasis or infiltration in both lung bases. Possibly multifocal pneumonia. Electronically Signed   By: Lucienne Capers M.D.   On: 01/27/2020 04:17    My personal review of EKG: Rhythm NSR, borderline T wave normalities   Assessment & Plan:    Active Problems:   Acute hypoxemic respiratory failure ([place])   1. Acute hypoxemic respiratory failure-SARS Covid 2 RT-PCR is negative, chest x-ray shows changes in lung bases.  She does have bibasilar crackles on auscultation.  BNP is 34.  Will obtain CT chest high-resolution to rule out underlying ILD/post Covid changes.  Patient is immunosuppressed due to use of Rituxan for neuromyelitis optica.  We will start Solu-Medrol 40 mg IV every 6 hours, DuoNeb nebulizers every 6 hours, Mucinex 1 tablet p.o. twice daily.  We will also start low-dose Lasix 20 mg daily.  Will obtain echocardiogram. 2. ?  Multifocal pneumonia-chest x-ray shows possible multifocal pneumonia, CT chest high-resolution will be obtained as above.  We will continue with empiric vancomycin and Levaquin.  Follow blood culture results. 3. History of neuromyelitis optica-patient get Rituxan every 4 months.  Last dose was December 30, 2019. 4. Leukopenia-patient is mildly immune suppressed, WBC count 2.6.  Likely from Rituxan.  Will follow CBC in  a.m. 5. History of pulmonary embolism in 2018-treated with Xarelto for 6 months.   DVT Prophylaxis-   Lovenox   AM Labs Ordered, also please review Full Orders  Family Communication: Admission, patients condition and plan of care including tests being ordered have been discussed with the patient and her daughter at bedside who indicate understanding and agree with the plan and Code Status.  Code Status: Full code  Admission status: Observation     Time spent in minutes : 60 minutes   Preet Mangano S Dalton Mille M.D

## 2020-01-27 NOTE — Progress Notes (Signed)
*  PRELIMINARY RESULTS* Echocardiogram 2D Echocardiogram has been performed.  Grace Moyer 01/27/2020, 1:39 PM

## 2020-01-27 NOTE — Progress Notes (Signed)
Patient arrived from ED in wheelchair. Upon assessment patient denies any pain. She is alert and oriented x 4. Skin is intact. She does have dyspnea on exertion. Patient placed on 2L [place] with oxygen level at 98%. Patient oriented to room. Call bell is within reach. Instructed her to call out for assistance. Daughter is at the bedside. Vital signs upon admission to room 339:  BP 111/63 (BP Location: Right Arm)   Pulse 78   Temp 98.9 F (37.2 C) (Oral)   Resp 19   Ht 5\' 3"  (1.6 m)   Wt 81.6 kg   SpO2 100%   BMI 31.87 kg/m

## 2020-01-27 NOTE — Progress Notes (Addendum)
Pharmacy Antibiotic Note  Grace Moyer is a 59 y.o. female admitted on 01/27/2020 with pneumonia.  Pharmacy has been consulted for Vancomycin/Levaquin dosing.  Patient failed outpatient Augmentin.  Plan: Vancomycin 1500 mg IV x 1 dose. Vancomycin 1250 mg IV every 24 hours. Expected AUC 464. Levaquin 750 mg IV every 24 hours. Monitor labs, c/s, and vanco level as indicated.   Height: 5\' 3"  (160 cm) Weight: 81.6 kg (180 lb) IBW/kg (Calculated) : 52.4  Temp (24hrs), Avg:98.5 F (36.9 C), Min:98.5 F (36.9 C), Max:98.5 F (36.9 C)  Recent Labs  Lab 01/27/20 0544 01/27/20 0751  WBC 2.6*  --   CREATININE 0.55  --   LATICACIDVEN 1.1 1.1    Estimated Creatinine Clearance: 77.6 mL/min (by C-G formula based on SCr of 0.55 mg/dL).    Allergies  Allergen Reactions  . Hydrocodone Other (See Comments)    Did not help with pain and kept her up all night    Antimicrobials this admission: Vanco 11/26 >>  Levaquin 11/26 >>    Microbiology results: 11/26 BCx: pending  11/26 MRSA PCR: pending  Thank you for allowing pharmacy to be a part of this patient's care.  Ramond Craver 01/27/2020 8:27 AM

## 2020-01-27 NOTE — ED Triage Notes (Signed)
Pt states she is currently taking antibiotics for URI and "is just not getting any better". Pt states she "hasn't been able to eat in days". Pt also reports fevers at home.

## 2020-01-27 NOTE — ED Notes (Signed)
Report given the [facility]

## 2020-01-27 NOTE — ED Notes (Signed)
Attempted to call report, nurse unavailable.

## 2020-01-27 NOTE — ED Provider Notes (Signed)
[facility] EMERGENCY DEPARTMENT Provider Note   CSN: [id number] Arrival date & time: 01/27/20  9476   Time seen 4:57 AM  History Chief Complaint  Patient presents with  . Shortness of Breath    Grace Moyer is a 59 y.o. female.  HPI   Patient states she has not felt well for about a week.  She has been having trouble breathing.  She states she has dyspnea on exertion.  She has been having fever up to 102.6 and she states that was last week with chills.  She has a cough when she exerts herself but it is dry.  She has mild sore throat but no rhinorrhea.  She has nausea but no vomiting.  She had diarrhea a few days ago and has about 2 episodes a day that is loose.  She is not eating because she is so nauseated.  She is dizzy and lightheaded on standing.  She states she has decreased urinary output and it is dark.  She denies chest pain.  She states she could hear herself rattling in her chest last week but denies wheezing.  She states she was never on an albuterol inhaler until she had Covid about a year ago.  She has found if she uses the inhaler and then walks to the bathroom she feels less short of breath.  She is not on oxygen at home.  She states when she walks to the bathroom her pulse ox can drop to 84% and that just started over the past week.  Her primary care doctor started her on Augmentin which she thinks is what caused her diarrhea but she states she is not improving.  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  She gets it done at [facility].  She has been on it for about 4 years.  Patient had Covid about a year ago.  She states she took [employer] vaccine in May and June.  PCP Sharilyn Sites, MD   Past Medical History:  Diagnosis Date  . Anxiety   . Breast cancer ([place])   . Family history of breast cancer   . Family history of colon cancer   . Malignant neoplasm of upper-outer quadrant of left female  breast ([place]) 01/31/2016  . Neuromyelitis optica ([place])   . Personal history of chemotherapy   . Personal history of radiation therapy     Patient Active Problem List   Diagnosis Date Noted  . Prediabetes   . Transaminitis   . Gastroesophageal reflux disease   . Class 2 obesity due to excess calories with body mass index (BMI) of 36.0 to 36.9 in adult   . Pneumonia due to COVID-19 virus 02/10/2019  . Personal history of venous thrombosis and embolism 12/31/2016  . Peripheral neuropathy due to chemotherapy ([place]) 12/31/2016  . Acute pulmonary embolus ([place]) 08/15/2016  . Anemia due to antineoplastic chemotherapy 04/23/2016  . Genetic testing 04/03/2016  . Port catheter in place 03/26/2016  . Family history of breast cancer   . Family history of colon cancer   . Neuromyelitis optica spectrum disorder ([place]) 02/06/2016  . Malignant neoplasm of upper-outer quadrant of left female breast ([place]) 01/31/2016  . Bilateral leg numbness 08/14/2015  . Neck pain on left side 08/14/2015  . Abnormal MRI of head 08/14/2015    Past Surgical History:  Procedure Laterality Date  . ABDOMINAL HYSTERECTOMY     partial  .  BREAST BIOPSY Left 2017  . BREAST LUMPECTOMY Left    2018  . BREAST LUMPECTOMY WITH RADIOACTIVE SEED AND SENTINEL LYMPH NODE BIOPSY Left 07/31/2016   Procedure: LEFT BREAST RADIOACTIVE SEED GUIDED LUMPECTOMY WITH LEFT RADIOACTIVE SEED TARGETED AXILLARY SENTINEL LYMPH NODE EXCISION AND SENTINEL LYMPH NODE BIOPSY;  Surgeon: Rolm Bookbinder, MD;  Location: [place];  Service: General;  Laterality: Left;  . BREAST REDUCTION SURGERY Bilateral 08/05/2016   Procedure: LEFT ONCOPLASTIC REDUCTION; RIGHT BREAST REDUCTION;  Surgeon: Irene Limbo, MD;  Location: [place];  Service: Plastics;  Laterality: Bilateral;  . EYE SURGERY    . PORT-A-CATH REMOVAL Right 07/31/2016   Procedure: REMOVAL PORT-A-CATH;  Surgeon: Rolm Bookbinder, MD;  Location: [place];  Service: General;  Laterality: Right;  . PORTACATH PLACEMENT Right 02/11/2016   Procedure: INSERTION PORT-A-CATH WITH [place];  Surgeon: Rolm Bookbinder, MD;  Location: [place];  Service: General;  Laterality: Right;  . REDUCTION MAMMAPLASTY       OB History   No obstetric history on file.     Family History  Problem Relation Age of Onset  . Breast cancer Maternal Grandmother        dx in her 62s  . CAD Paternal Grandmother   . Colon cancer Paternal Uncle        dx in his 64s  . COPD Maternal Grandfather   . CAD Paternal Grandfather   . Breast cancer Cousin        dx in her early to mid 41s; maternal first cousin  . Lung cancer Paternal Uncle     Social History   Tobacco Use  . Smoking status: Former Smoker    Types: Cigarettes    Quit date: 09/03/2001    Years since quitting: 18.4  . Smokeless tobacco: Never Used  Substance Use Topics  . Alcohol use: Yes    Comment: social  . Drug use: No    Home Medications Prior to Admission medications   Medication Sig Start Date End Date Taking? Authorizing Provider  albuterol (VENTOLIN HFA) 108 (90 Base) MCG/ACT inhaler Inhale 2 puffs into the lungs every 4 (four) hours as needed for wheezing or shortness of breath. 09/01/19   Mannam, Hart Robinsons, MD  aspirin 81 MG tablet Take 1 tablet (81 mg total) by mouth daily. 03/30/17   Magrinat, Virgie Dad, MD  Cholecalciferol (VITAMIN D-1000 MAX ST) 1000 units tablet Take 5,000 Units by mouth daily.     [provider]  fluticasone furoate-vilanterol (BREO ELLIPTA) 200-25 MCG/INH AEPB Inhale 1 puff into the lungs daily. 09/20/19   Mannam, Hart Robinsons, MD  gabapentin (NEURONTIN) 300 MG capsule Take 300 mg by mouth at bedtime.    [provider]  vitamin C (VITAMIN C) 500 MG tablet Take 1 tablet (500 mg total) by mouth daily. 02/14/19   Barton Dubois, MD  VITAMIN D PO Take 3,000 Units by mouth daily.    [provider]  zinc sulfate 220 (50 Zn)  MG capsule Take 1 capsule (220 mg total) by mouth daily. Patient taking differently: Take 50 mg by mouth daily.  02/14/19   Barton Dubois, MD  pantoprazole (PROTONIX) 40 MG tablet Take 1 tablet (40 mg total) by mouth daily. 02/13/19 02/27/19  Barton Dubois, MD    Allergies    Hydrocodone  Review of Systems   Review of Systems  All other systems reviewed and are negative.   Physical Exam Updated Vital Signs BP 116/63  Pulse 70   Temp 98.5 F (36.9 C) (Oral)   Resp (!) 31   Ht 5\' 3"  (1.6 m)   Wt 81.6 kg   SpO2 100%   BMI 31.89 kg/m   Physical Exam Vitals and nursing note reviewed.  Constitutional:      General: She is in acute distress.     Appearance: Normal appearance. She is normal weight.  HENT:     Head: Normocephalic and atraumatic.     Right Ear: External ear normal.     Left Ear: External ear normal.     Mouth/Throat:     Mouth: Mucous membranes are dry.  Eyes:     Extraocular Movements: Extraocular movements intact.     Conjunctiva/sclera: Conjunctivae normal.     Pupils: Pupils are equal, round, and reactive to light.  Cardiovascular:     Rate and Rhythm: Normal rate and regular rhythm.     Pulses: Normal pulses.     Heart sounds: Normal heart sounds.  Pulmonary:     Effort: Tachypnea present.     Breath sounds: Normal breath sounds. No wheezing, rhonchi or rales.     Comments: Patient gets short of breath when she talks. Musculoskeletal:        General: No swelling.     Cervical back: Normal range of motion.  Skin:    General: Skin is warm and dry.     Findings: No rash.  Neurological:     General: No focal deficit present.     Mental Status: She is alert and oriented to person, place, and time.     Cranial Nerves: No cranial nerve deficit.  Psychiatric:        Mood and Affect: Mood normal.        Behavior: Behavior normal.        Thought Content: Thought content normal.     ED Results / Procedures / Treatments   Labs (all labs ordered  are listed, but only abnormal results are displayed) Results for orders placed or performed during the hospital encounter of 01/27/20  Resp Panel by RT-PCR (Flu A&B, Covid) Nasopharyngeal Swab   Specimen: Nasopharyngeal Swab; Nasopharyngeal(NP) swabs in vial transport medium  Result Value Ref Range   SARS Coronavirus 2 by RT PCR NEGATIVE NEGATIVE   Influenza A by PCR NEGATIVE NEGATIVE   Influenza B by PCR NEGATIVE NEGATIVE  Lactic acid, plasma  Result Value Ref Range   Lactic Acid, Venous 1.1 0.5 - 1.9 mmol/L  CBC WITH DIFFERENTIAL  Result Value Ref Range   WBC 2.6 (L) 4.0 - 10.5 K/uL   RBC 4.02 3.87 - 5.11 MIL/uL   Hemoglobin 11.3 (L) 12.0 - 15.0 g/dL   HCT 35.4 (L) 36 - 46 %   MCV 88.1 80.0 - 100.0 fL   MCH 28.1 26.0 - 34.0 pg   MCHC 31.9 30.0 - 36.0 g/dL   RDW 13.9 11.5 - 15.5 %   Platelets 279 150 - 400 K/uL   nRBC 0.0 0.0 - 0.2 %   Neutrophils Relative % 81 %   Neutro Abs 2.1 1.7 - 7.7 K/uL   Lymphocytes Relative 9 %   Lymphs Abs 0.2 (L) 0.7 - 4.0 K/uL   Monocytes Relative 8 %   Monocytes Absolute 0.2 0.1 - 1.0 K/uL   Eosinophils Relative 1 %   Eosinophils Absolute 0.0 0.0 - 0.5 K/uL   Basophils Relative 0 %   Basophils Absolute 0.0 0.0 - 0.1 K/uL   Immature  Granulocytes 1 %   Abs Immature Granulocytes 0.03 0.00 - 0.07 K/uL  Comprehensive metabolic panel  Result Value Ref Range   Sodium 136 135 - 145 mmol/L   Potassium 3.7 3.5 - 5.1 mmol/L   Chloride 103 98 - 111 mmol/L   CO2 23 22 - 32 mmol/L   Glucose, Bld 114 (H) 70 - 99 mg/dL   BUN 12 6 - 20 mg/dL   Creatinine, Ser 0.55 0.44 - 1.00 mg/dL   Calcium 8.4 (L) 8.9 - 10.3 mg/dL   Total Protein 6.3 (L) 6.5 - 8.1 g/dL   Albumin 2.9 (L) 3.5 - 5.0 g/dL   AST 45 (H) 15 - 41 U/L   ALT 58 (H) 0 - 44 U/L   Alkaline Phosphatase 472 (H) 38 - 126 U/L   Total Bilirubin 0.3 0.3 - 1.2 mg/dL   GFR, Estimated >60 >60 mL/min   Anion gap 10 5 - 15  D-dimer, quantitative  Result Value Ref Range   D-Dimer, Quant 0.36 0.00 - 0.50  ug/mL-FEU  Procalcitonin  Result Value Ref Range   Procalcitonin <0.10 ng/mL  Lactate dehydrogenase  Result Value Ref Range   LDH 170 98 - 192 U/L  Ferritin  Result Value Ref Range   Ferritin 698 (H) 11 - 307 ng/mL  Triglycerides  Result Value Ref Range   Triglycerides 117 <150 mg/dL  Fibrinogen  Result Value Ref Range   Fibrinogen >800 (H) 210 - 475 mg/dL  C-reactive protein  Result Value Ref Range   CRP 7.7 (H) <1.0 mg/dL  Brain natriuretic peptide  Result Value Ref Range   B Natriuretic Peptide 34.0 0.0 - 100.0 pg/mL  Troponin I (High Sensitivity)  Result Value Ref Range   Troponin I (High Sensitivity) 8 <18 ng/L   Laboratory interpretation all normal except low WBC without neutropenia, elevation of inflammatory markers, mild anemia    EKG EKG Interpretation  Date/Time:  Friday January 27 2020 03:50:55 EST Ventricular Rate:  85 PR Interval:    QRS Duration: 90 QT Interval:  379 QTC Calculation: 451 R Axis:   47 Text Interpretation: Sinus rhythm Low voltage, precordial leads Borderline T abnormalities, anterior leads No significant change since last tracing 27 Feb 2019 Confirmed by Rolland Porter [phone]) on 01/27/2020 4:01:59 AM   #2 EKG  EKG Interpretation  Date/Time:  Friday January 27 2020 05:31:35 EST Ventricular Rate:  74 PR Interval:    QRS Duration: 104 QT Interval:  404 QTC Calculation: 449 R Axis:   38 Text Interpretation: Sinus rhythm Low voltage, precordial leads Borderline T abnormalities, anterior leads Electrode noise No significant change since last tracing about 2 hours before Confirmed by Rolland Porter [phone]) on 01/27/2020 6:02:16 AM        Radiology DG Chest Port 1 View  Result Date: 01/27/2020 CLINICAL DATA:  Cough, shortness of breath, and fever. Currently on antibiotics for upper respiratory infection not getting any better. EXAM: PORTABLE CHEST 1 VIEW COMPARISON:  03/22/2019 FINDINGS: Shallow inspiration with atelectasis or  infiltration in both lung bases. Possibly multifocal pneumonia. No pleural effusions. No pneumothorax. Heart size and pulmonary vascularity are normal. IMPRESSION: Shallow inspiration with atelectasis or infiltration in both lung bases. Possibly multifocal pneumonia. Electronically Signed   By: Lucienne Capers M.D.   On: 01/27/2020 04:17    Procedures Procedures (including critical care time)  Medications Ordered in ED Medications  vancomycin (VANCOREADY) IVPB 1500 mg/300 mL (1,500 mg Intravenous New Bag/Given 01/27/20 0711)  sodium chloride 0.9 %  bolus 1,000 mL (0 mLs Intravenous Stopped 01/27/20 0629)  levofloxacin (LEVAQUIN) IVPB 750 mg (0 mg Intravenous Stopped 01/27/20 5072)    ED Course  I have reviewed the triage vital signs and the nursing notes.  Pertinent labs & imaging results that were available during my care of the patient were reviewed by me and considered in my medical decision making (see chart for details).    MDM Rules/Calculators/A&P                          Patient's Covid test is negative.  She was given IV fluids for dehydration.  She was given vancomycin and Levaquin since she has failed outpatient treatment with Augmentin.  Laboratory testing was done.  7:18 AM Dr. Darrick Meigs, hospitalist will admit  Final Clinical Impression(s) / ED Diagnoses Final diagnoses:  Community acquired pneumonia, unspecified laterality  Hypoxia    Rx / DC Orders  Plan admission  Rolland Porter, MD, Barbette Or, MD 01/27/20 [phone]

## 2020-01-28 DIAGNOSIS — J84116 Cryptogenic organizing pneumonia: Secondary | ICD-10-CM

## 2020-01-28 DIAGNOSIS — J189 Pneumonia, unspecified organism: Secondary | ICD-10-CM | POA: Diagnosis not present

## 2020-01-28 DIAGNOSIS — J9601 Acute respiratory failure with hypoxia: Secondary | ICD-10-CM | POA: Diagnosis not present

## 2020-01-28 LAB — COMPREHENSIVE METABOLIC PANEL
ALT: 55 U/L — ABNORMAL HIGH (ref 0–44)
AST: 35 U/L (ref 15–41)
Albumin: 3.2 g/dL — ABNORMAL LOW (ref 3.5–5.0)
Alkaline Phosphatase: 472 U/L — ABNORMAL HIGH (ref 38–126)
Anion gap: 11 (ref 5–15)
BUN: 16 mg/dL (ref 6–20)
CO2: 24 mmol/L (ref 22–32)
Calcium: 9.3 mg/dL (ref 8.9–10.3)
Chloride: 101 mmol/L (ref 98–111)
Creatinine, Ser: 0.58 mg/dL (ref 0.44–1.00)
GFR, Estimated: >60 mL/min (ref >60)
Glucose, Bld: 171 mg/dL — ABNORMAL HIGH (ref 70–99)
Potassium: 4 mmol/L (ref 3.5–5.1)
Sodium: 136 mmol/L (ref 135–145)
Total Bilirubin: 0.7 mg/dL (ref 0.3–1.2)
Total Protein: 7 g/dL (ref 6.5–8.1)

## 2020-01-28 LAB — CBC
HCT: 41 % (ref 36.0–46.0)
Hemoglobin: 12.9 g/dL (ref 12.0–15.0)
MCH: 27.7 pg (ref 26.0–34.0)
MCHC: 31.5 g/dL (ref 30.0–36.0)
MCV: 88.2 fL (ref 80.0–100.0)
Platelets: 298 10*3/uL (ref 150–400)
RBC: 4.65 MIL/uL (ref 3.87–5.11)
RDW: 13.9 % (ref 11.5–15.5)
WBC: 4 10*3/uL (ref 4.0–10.5)
nRBC: 0 % (ref 0.0–0.2)

## 2020-01-28 MED ORDER — LEVOFLOXACIN 750 MG PO TABS: 750.0000 mg | ORAL_TABLET | Freq: Every day | ORAL | 0 refills | Status: AC

## 2020-01-28 MED ORDER — GUAIFENESIN ER 600 MG PO TB12: 600.0000 mg | ORAL_TABLET | Freq: Two times a day (BID) | ORAL | 0 refills | Status: AC

## 2020-01-28 MED ORDER — PREDNISONE 10 MG PO TABS: ORAL_TABLET | ORAL | 0 refills | Status: DC

## 2020-01-28 NOTE — Discharge Summary (Signed)
Physician Discharge Summary  Grace Moyer XIP:[id number] DOB: 1960-05-26 DOA: 01/27/2020  PCP: Grace Sites, MD  Admit date: 01/27/2020 Discharge date: 01/28/2020  Time spent: 50 minutes  Recommendations for Outpatient Follow-up:  1. Follow-up pulmonology in 2 weeks   Discharge Diagnoses:  Active Problems:   Acute hypoxemic respiratory failure [facility])   Discharge Condition: Stable  Diet recommendation: Heart healthy diet  Filed Weights   01/27/20 0350 01/27/20 1207  Weight: 81.6 kg 81.6 kg    History of present illness:  59 y.o. 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  Patient says that she was having fever at home also having chills.  She was seen by her PCP and was prescribed Augmentin on Monday.  Patient symptoms did not improve so she came to the hospital.  She also had diarrhea a few days ago which she had 2 episodes of stool that day.  Patient also had poor appetite, not eating and drinking well.  Patient said that she had Covid pneumonia last year in December, at that time she was in the hospital for 4 days and got better and was discharged home.  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  She also has neuromyelitis optica and gets Rituxan every 4 months.  Her last dose was December 30, 2019.  In the ED patient was found to be hypoxic with O2 sats 89% on room air, tachypnea. Chest x-ray showed shallow inspiration with atelectasis or infiltrates in both lung bases.  Possible multifocal pneumonia.   Hospital Course:  Acute hypoxemic respiratory failure-chest x-ray showed possible multifocal pneumonia.  CT chest high-resolution was obtained which showed new consolidation in the left lower lobe with surrounding patchy groundglass opacities throughout much  of left lower lobe.  Interval clearing of patchy groundglass placed in the upper lobe, overall appearance favors cryptogenic organizing pneumonia.  Patient was started on Solu-Medrol, she has significantly improved.  No longer requiring oxygen.  O2 sats 93% on ambulation.  Called and discussed with pulmonologist Dr. Valeta Harms at [facility].  He reviewed the films and agrees that patient can be discharged on prednisone taper along with Levaquin.  Patient can follow-up with Dr. Vaughan Moyer in 2 weeks.  Cryptogenic organizing pneumonia-likely post Covid, patient had COVID-19 infection December last year.  Will be discharged on prednisone taper as above along with Levaquin.  Leukopenia-WBC improved to 4.0 today.  History of neuromyelitis optica-patient get Rituxan every 4 months.  Procedures:    Consultations:    Discharge Exam: Vitals:   01/27/20 2113 01/28/20 0827  BP:    Pulse:    Resp:    Temp:    SpO2: 94% 94%    General: Appears in no acute distress Cardiovascular: S1-S2, regular Respiratory: Faint crackles in left lung base  Discharge Instructions   Discharge Instructions    Diet - low sodium heart healthy   Complete by: As directed    Increase activity slowly   Complete by: As directed      Allergies as of 01/28/2020      Reactions   Hydrocodone Other (See Comments)   Did not help with pain and kept her up all night      Medication List    STOP taking these medications   amoxicillin-clavulanate 875-125 MG tablet Commonly known as: AUGMENTIN  VITAMIN D PO     TAKE these medications   albuterol 108 (90 Base) MCG/ACT inhaler Commonly known as: VENTOLIN HFA Inhale 2 puffs into the lungs every 4 (four) hours as needed for wheezing or shortness of breath.   ascorbic acid 500 MG tablet Commonly known as: VITAMIN C Take 1 tablet (500 mg total) by mouth daily.   aspirin 81 MG tablet Take 1 tablet (81 mg total) by mouth daily.   benzonatate 200 MG  capsule Commonly known as: TESSALON Take 200 mg by mouth in the morning, at noon, and at bedtime.   Breo Ellipta 200-25 MCG/INH Aepb Generic drug: fluticasone furoate-vilanterol Inhale 1 puff into the lungs daily.   gabapentin 300 MG capsule Commonly known as: NEURONTIN Take 300 mg by mouth at bedtime.   guaiFENesin 600 MG 12 hr tablet Commonly known as: MUCINEX Take 1 tablet (600 mg total) by mouth 2 (two) times daily for 7 days.   levofloxacin 750 MG tablet Commonly known as: Levaquin Take 1 tablet (750 mg total) by mouth daily for 6 days. Start taking on: January 29, 2020   predniSONE 10 MG tablet Commonly known as: DELTASONE Prednisone 40 mg po daily x 7 day then Prednisone 30 mg po daily x 3 day then Prednisone 20 mg po daily x 3 day then Prednisone 10 mg daily x 2 day then stop...   Vitamin D-1000 Max St 25 MCG (1000 UT) tablet Generic drug: Cholecalciferol Take 5,000 Units by mouth daily.   zinc sulfate 220 (50 Zn) MG capsule Take 1 capsule (220 mg total) by mouth daily. What changed: how much to take      Allergies  Allergen Reactions  . Hydrocodone Other (See Comments)    Did not help with pain and kept her up all night      The results of significant diagnostics from this hospitalization (including imaging, microbiology, ancillary and laboratory) are listed below for reference.    Significant Diagnostic Studies: CT Chest High Resolution  Result Date: 01/27/2020 CLINICAL DATA:  Shortness of breath, prior COVID pneumonia in November 2020. Interstitial lung disease. Cough. Fever and chills. Lingering symptoms. EXAM: CT CHEST WITHOUT CONTRAST TECHNIQUE: Multidetector CT imaging of the chest was performed following the standard protocol without intravenous contrast. High resolution imaging of the lungs, as well as inspiratory and expiratory imaging, was performed. COMPARISON:  08/05/2019 FINDINGS: Cardiovascular: Mild atherosclerotic calcification along the  aortic arch. Mediastinum/Nodes: Small mediastinal lymph nodes are not pathologically enlarged. Small type 1 hiatal hernia. Clips noted along the left breast glandular tissues posteriorly. Lungs/Pleura: Subpleural scarring and additional bandlike scarring in the lingula is chronic and likely primarily from her prior radiation therapy. New consolidation in the left lower lobe with surrounding patchy ground-glass opacities throughout much of the left lower lobe. Most of the previous ground-glass opacities shown in the upper lobes have resolved. Patchy ground-glass opacities in the right lower lobe have a different distribution but are roughly similar in magnitude to the 08/05/2019 exam. However, there are new areas of volume loss and bandlike densities in the subpleural regions of the right lower lobe. Comparing inspiratory and expiratory images, we do not show substantial evidence of air trapping, and the consolidations and ground-glass densities are primarily attributed to airspace filling process is/alveolitis. Upper Abdomen: Unremarkable Musculoskeletal: Mild thoracic spondylosis. IMPRESSION: 1. New consolidation in the left lower lobe with surrounding patchy ground-glass opacities throughout much of the left lower lobe. Interval clearing of the patchy ground-glass opacities in the  upper lobes, with roughly stable amount of ground-glass opacity in the right lower lobe accompanied by new bandlike subpleural regions of atelectasis. Overall appearance favors cryptogenic organizing pneumonia. 2. Small type 1 hiatal hernia. 3. Aortic atherosclerosis. Aortic Atherosclerosis (ICD10-I70.0). Electronically Signed   By: Van Clines M.D.   On: 01/27/2020 12:53   DG Chest Port 1 View  Result Date: 01/27/2020 CLINICAL DATA:  Cough, shortness of breath, and fever. Currently on antibiotics for upper respiratory infection not getting any better. EXAM: PORTABLE CHEST 1 VIEW COMPARISON:  03/22/2019 FINDINGS: Shallow  inspiration with atelectasis or infiltration in both lung bases. Possibly multifocal pneumonia. No pleural effusions. No pneumothorax. Heart size and pulmonary vascularity are normal. IMPRESSION: Shallow inspiration with atelectasis or infiltration in both lung bases. Possibly multifocal pneumonia. Electronically Signed   By: Lucienne Capers M.D.   On: 01/27/2020 04:17   ECHOCARDIOGRAM COMPLETE  Result Date: 01/27/2020    ECHOCARDIOGRAM REPORT   Patient Name:   [facility] Date of Exam: 01/27/2020 Medical Rec #:  [id number]          Height:       63.0 in Accession #:    [id number]         Weight:       180.0 lb Date of Birth:  December 16, 1960          BSA:          1.849 m Patient Age:    48 years           BP:           122/73 mmHg Patient Gender: F                  HR:           79 bpm. Exam Location:  [facility] Procedure: 2D Echo, Cardiac Doppler and Color Doppler Indications:    Dyspnea 786.09 / R06.00  History:        Patient has prior history of Echocardiogram examinations, most                 recent 11/19/2016. H/o Pneumonia due to COVID-19 virus,Personal                 history of venous thrombosis and embolism,Malignant neoplasm of                 upper-outer quadrant of left female breast.  Sonographer:    Alvino Chapel RCS Referring Phys: [place]  1. Left ventricular ejection fraction, by estimation, is 60 to 65%. The left ventricle has normal function. The left ventricle has no regional wall motion abnormalities. Left ventricular diastolic parameters were normal.  2. Right ventricular systolic function is normal. The right ventricular size is normal.  3. The mitral valve is normal in structure. No evidence of mitral valve regurgitation. No evidence of mitral stenosis.  4. The aortic valve is normal in structure. Aortic valve regurgitation is not visualized. No aortic stenosis is present.  5. The inferior vena cava is normal in size with greater than 50% respiratory  variability, suggesting right atrial pressure of 3 mmHg. FINDINGS  Left Ventricle: Left ventricular ejection fraction, by estimation, is 60 to 65%. The left ventricle has normal function. The left ventricle has no regional wall motion abnormalities. The left ventricular internal cavity size was normal in size. There is  no left ventricular hypertrophy. Left ventricular diastolic parameters were normal. Normal left ventricular filling pressure.  Right Ventricle: The right ventricular size is normal. No increase in right ventricular wall thickness. Right ventricular systolic function is normal. Left Atrium: Left atrial size was normal in size. Right Atrium: Right atrial size was normal in size. Pericardium: There is no evidence of pericardial effusion. Mitral Valve: The mitral valve is normal in structure. No evidence of mitral valve regurgitation. No evidence of mitral valve stenosis. Tricuspid Valve: The tricuspid valve is normal in structure. Tricuspid valve regurgitation is not demonstrated. No evidence of tricuspid stenosis. Aortic Valve: The aortic valve is normal in structure. Aortic valve regurgitation is not visualized. No aortic stenosis is present. Pulmonic Valve: The pulmonic valve was normal in structure. Pulmonic valve regurgitation is not visualized. No evidence of pulmonic stenosis. Aorta: The aortic root is normal in size and structure. Venous: The inferior vena cava is normal in size with greater than 50% respiratory variability, suggesting right atrial pressure of 3 mmHg. IAS/Shunts: No atrial level shunt detected by color flow Doppler.  LEFT VENTRICLE PLAX 2D LVIDd:         4.30 cm  Diastology LVIDs:         2.80 cm  LV e' medial:    10.20 cm/s LV PW:         1.00 cm  LV E/e' medial:  5.8 LV IVS:        0.70 cm  LV e' lateral:   10.00 cm/s LVOT diam:     1.60 cm  LV E/e' lateral: 5.9 LV SV:         36 LV SV Index:   19 LVOT Area:     2.01 cm  RIGHT VENTRICLE RV S prime:     11.70 cm/s TAPSE  (M-mode): 1.9 cm LEFT ATRIUM             Index       RIGHT ATRIUM           Index LA diam:        2.90 cm 1.57 cm/m  RA Area:     13.10 cm LA Vol (A2C):   32.7 ml 17.69 ml/m RA Volume:   32.70 ml  17.69 ml/m LA Vol (A4C):   52.2 ml 28.23 ml/m LA Biplane Vol: 43.5 ml 23.53 ml/m  AORTIC VALVE LVOT Vmax:   92.95 cm/s LVOT Vmean:  61.250 cm/s LVOT VTI:    0.178 m  AORTA Ao Root diam: 3.10 cm MITRAL VALVE MV Area (PHT): 3.61 cm    SHUNTS MV Decel Time: 210 msec    Systemic VTI:  0.18 m MV E velocity: 58.80 cm/s  Systemic Diam: 1.60 cm MV A velocity: 61.80 cm/s MV E/A ratio:  0.95 Mihai Croitoru MD Electronically signed by Sanda Klein MD Signature Date/Time: 01/27/2020/4:49:41 PM    Final     Microbiology: Recent Results (from the past 240 hour(s))  Resp Panel by RT-PCR (Flu A&B, Covid) Nasopharyngeal Swab     Status: None   Collection Time: 01/27/20  4:03 AM   Specimen: Nasopharyngeal Swab; Nasopharyngeal(NP) swabs in vial transport medium  Result Value Ref Range Status   SARS Coronavirus 2 by RT PCR NEGATIVE NEGATIVE Final    Comment: (NOTE) SARS-CoV-2 target nucleic acids are NOT DETECTED.  The SARS-CoV-2 RNA is generally detectable in upper respiratory specimens during the acute phase of infection. The lowest concentration of SARS-CoV-2 viral copies this assay can detect is 138 copies/mL. A negative result does not preclude SARS-Cov-2 infection and should not be used as  the sole basis for treatment or other patient management decisions. A negative result may occur with  improper specimen collection/handling, submission of specimen other than nasopharyngeal swab, presence of viral mutation(s) within the areas targeted by this assay, and inadequate number of viral copies(<138 copies/mL). A negative result must be combined with clinical observations, patient history, and epidemiological information. The expected result is Negative.  Fact Sheet for Patients:   EntrepreneurPulse.com.au  Fact Sheet for Healthcare Providers:  IncredibleEmployment.be  This test is no t yet approved or cleared by the [place] FDA and  has been authorized for detection and/or diagnosis of SARS-CoV-2 by FDA under an Emergency Use Authorization (EUA). This EUA will remain  in effect (meaning this test can be used) for the duration of the COVID-19 declaration under Section 564(b)(1) of the Act, 21 U.S.C.section 360bbb-3(b)(1), unless the authorization is terminated  or revoked sooner.       Influenza A by PCR NEGATIVE NEGATIVE Final   Influenza B by PCR NEGATIVE NEGATIVE Final    Comment: (NOTE) The Xpert Xpress SARS-CoV-2/FLU/RSV plus assay is intended as an aid in the diagnosis of influenza from Nasopharyngeal swab specimens and should not be used as a sole basis for treatment. Nasal washings and aspirates are unacceptable for Xpert Xpress SARS-CoV-2/FLU/RSV testing.  Fact Sheet for Patients: EntrepreneurPulse.com.au  Fact Sheet for Healthcare Providers: IncredibleEmployment.be  This test is not yet approved or cleared by the [place] FDA and has been authorized for detection and/or diagnosis of SARS-CoV-2 by FDA under an Emergency Use Authorization (EUA). This EUA will remain in effect (meaning this test can be used) for the duration of the COVID-19 declaration under Section 564(b)(1) of the Act, 21 U.S.C. section 360bbb-3(b)(1), unless the authorization is terminated or revoked.  Performed at [facility], [address] [place], [place] [address]   Blood Culture (routine x 2)     Status: None (Preliminary result)   Collection Time: 01/27/20  5:30 AM   Specimen: BLOOD  Result Value Ref Range Status   Specimen Description BLOOD SITE NOT SPECIFIED  Final   Special Requests   Final    BOTTLES DRAWN AEROBIC AND ANAEROBIC Blood Culture adequate volume   Culture   Final     NO GROWTH 1 DAY Performed at [facility], [address] [place], [place] [address]    Report Status PENDING  Incomplete  Blood Culture (routine x 2)     Status: None (Preliminary result)   Collection Time: 01/27/20  5:44 AM   Specimen: BLOOD  Result Value Ref Range Status   Specimen Description BLOOD BLOOD RIGHT HAND  Final   Special Requests   Final    BOTTLES DRAWN AEROBIC AND ANAEROBIC Blood Culture adequate volume   Culture   Final    NO GROWTH 1 DAY Performed at [facility], [address], [place], [place] [address]    Report Status PENDING  Incomplete  MRSA PCR Screening     Status: None   Collection Time: 01/27/20  1:27 PM   Specimen: Nasal Mucosa; Nasopharyngeal  Result Value Ref Range Status   MRSA by PCR NEGATIVE NEGATIVE Final    Comment:        The GeneXpert MRSA Assay (FDA approved for NASAL specimens only), is one component of a comprehensive MRSA colonization surveillance program. It is not intended to diagnose MRSA infection nor to guide or monitor treatment for MRSA infections. Performed at [facility], [address], [place], [place] [address]  Labs: Basic Metabolic Panel: Recent Labs  Lab 01/27/20 0544 01/28/20 0943  NA 136 136  K 3.7 4.0  CL 103 101  CO2 23 24  GLUCOSE 114* 171*  BUN 12 16  CREATININE 0.55 0.58  CALCIUM 8.4* 9.3   Liver Function Tests: Recent Labs  Lab 01/27/20 0544 01/28/20 0943  AST 45* 35  ALT 58* 55*  ALKPHOS 472* 472*  BILITOT 0.3 0.7  PROT 6.3* 7.0  ALBUMIN 2.9* 3.2*   No results for input(s): LIPASE, AMYLASE in the last 168 hours. No results for input(s): AMMONIA in the last 168 hours. CBC: Recent Labs  Lab 01/27/20 0544 01/28/20 0943  WBC 2.6* 4.0  NEUTROABS 2.1  --   HGB 11.3* 12.9  HCT 35.4* 41.0  MCV 88.1 88.2  PLT 279 298   Cardiac Enzymes: No results for input(s): CKTOTAL, CKMB, CKMBINDEX, TROPONINI in the last 168 hours. BNP: BNP (last 3 results) Recent Labs    02/27/19 0455  01/27/20 0544  BNP 101.0* 34.0    ProBNP (last 3 results) No results for input(s): PROBNP in the last 8760 hours.  CBG: No results for input(s): GLUCAP in the last 168 hours.     Signed:  Oswald Hillock MD.  [facility] 01/28/2020, 12:44 PM

## 2020-01-28 NOTE — Progress Notes (Signed)
Nsg Discharge Note  Admit Date:  01/27/2020 Discharge date: 01/28/2020   Breck Hollinger to be D/C'd Home per MD order.  AVS completed.  Copy for chart, and copy for patient signed, and dated. Patient/caregiver able to verbalize understanding.  Discharge Medication: Allergies as of 01/28/2020      Reactions   Hydrocodone Other (See Comments)   Did not help with pain and kept her up all night      Medication List    STOP taking these medications   amoxicillin-clavulanate 875-125 MG tablet Commonly known as: AUGMENTIN   VITAMIN D PO     TAKE these medications   albuterol 108 (90 Base) MCG/ACT inhaler Commonly known as: VENTOLIN HFA Inhale 2 puffs into the lungs every 4 (four) hours as needed for wheezing or shortness of breath.   ascorbic acid 500 MG tablet Commonly known as: VITAMIN C Take 1 tablet (500 mg total) by mouth daily.   aspirin 81 MG tablet Take 1 tablet (81 mg total) by mouth daily.   benzonatate 200 MG capsule Commonly known as: TESSALON Take 200 mg by mouth in the morning, at noon, and at bedtime.   Breo Ellipta 200-25 MCG/INH Aepb Generic drug: fluticasone furoate-vilanterol Inhale 1 puff into the lungs daily.   gabapentin 300 MG capsule Commonly known as: NEURONTIN Take 300 mg by mouth at bedtime.   guaiFENesin 600 MG 12 hr tablet Commonly known as: MUCINEX Take 1 tablet (600 mg total) by mouth 2 (two) times daily for 7 days.   levofloxacin 750 MG tablet Commonly known as: Levaquin Take 1 tablet (750 mg total) by mouth daily for 6 days. Start taking on: January 29, 2020   predniSONE 10 MG tablet Commonly known as: DELTASONE Prednisone 40 mg po daily x 7 day then Prednisone 30 mg po daily x 3 day then Prednisone 20 mg po daily x 3 day then Prednisone 10 mg daily x 2 day then stop...   Vitamin D-1000 Max St 25 MCG (1000 UT) tablet Generic drug: Cholecalciferol Take 5,000 Units by mouth daily.   zinc sulfate 220 (50 Zn) MG capsule Take  1 capsule (220 mg total) by mouth daily. What changed: how much to take       Discharge Assessment: Vitals:   01/27/20 2113 01/28/20 0827  BP:    Pulse:    Resp:    Temp:    SpO2: 94% 94%   Skin clean, dry and intact without evidence of skin break down, no evidence of skin tears noted. IV catheter discontinued intact. Site without signs and symptoms of complications - no redness or edema noted at insertion site, patient denies c/o pain - only slight tenderness at site.  Dressing with slight pressure applied.  D/c Instructions-Education: Discharge instructions given to patient/family with verbalized understanding. D/c education completed with patient/family including follow up instructions, medication list, d/c activities limitations if indicated, with other d/c instructions as indicated by MD - patient able to verbalize understanding, all questions fully answered. Patient instructed to return to ED, call 911, or call MD for any changes in condition.  Patient escorted via [place], and D/C home via private auto.  Zenaida Deed, RN 01/28/2020 1:33 PM

## 2020-01-28 NOTE — Progress Notes (Signed)
SATURATION QUALIFICATIONS: (This note is used to comply with regulatory documentation for home oxygen)  Patient Saturations on Room Air at Rest = 95%  Patient Saturations on Room Air while Ambulating = 92%  Patient Saturations on 0 Liters of oxygen while Ambulating = %  Please briefly explain why patient needs home oxygen:

## 2020-02-02 LAB — CULTURE, BLOOD (ROUTINE X 2)
Culture: NO GROWTH
Culture: NO GROWTH
Special Requests: ADEQUATE
Special Requests: ADEQUATE

## 2020-02-10 ENCOUNTER — Other Ambulatory Visit: Payer: Self-pay

## 2020-02-10 ENCOUNTER — Ambulatory Visit (INDEPENDENT_AMBULATORY_CARE_PROVIDER_SITE_OTHER): Payer: BC Managed Care – PPO | Admitting: Pulmonary Disease

## 2020-02-10 ENCOUNTER — Encounter: Payer: Self-pay | Admitting: Pulmonary Disease

## 2020-02-10 ENCOUNTER — Ambulatory Visit (INDEPENDENT_AMBULATORY_CARE_PROVIDER_SITE_OTHER): Payer: BC Managed Care – PPO

## 2020-02-10 VITALS — BP 112/62 | HR 73 | Temp 98.7°F | Ht 63.0 in | Wt 170.8 lb

## 2020-02-10 DIAGNOSIS — J9611 Chronic respiratory failure with hypoxia: Secondary | ICD-10-CM | POA: Insufficient documentation

## 2020-02-10 DIAGNOSIS — Z8616 Personal history of COVID-19: Secondary | ICD-10-CM

## 2020-02-10 DIAGNOSIS — J84116 Cryptogenic organizing pneumonia: Secondary | ICD-10-CM | POA: Diagnosis not present

## 2020-02-10 DIAGNOSIS — R918 Other nonspecific abnormal finding of lung field: Secondary | ICD-10-CM

## 2020-02-10 DIAGNOSIS — J984 Other disorders of lung: Secondary | ICD-10-CM | POA: Diagnosis not present

## 2020-02-10 DIAGNOSIS — J189 Pneumonia, unspecified organism: Secondary | ICD-10-CM | POA: Diagnosis not present

## 2020-02-10 DIAGNOSIS — Z0000 Encounter for general adult medical examination without abnormal findings: Secondary | ICD-10-CM | POA: Insufficient documentation

## 2020-02-10 DIAGNOSIS — G360 Neuromyelitis optica [Devic]: Secondary | ICD-10-CM

## 2020-02-10 MED ORDER — ALBUTEROL SULFATE HFA 108 (90 BASE) MCG/ACT IN AERS: 2.0000 | INHALATION_SPRAY | RESPIRATORY_TRACT | 3 refills | Status: AC | PRN

## 2020-02-10 MED ORDER — PREDNISONE 10 MG PO TABS: ORAL_TABLET | ORAL | 0 refills | Status: DC

## 2020-02-10 NOTE — Patient Instructions (Addendum)
You were seen today by Lauraine Rinne, NP  for:   1. Cryptogenic organizing pneumonia ([place]) 2. Abnormal findings on diagnostic imaging of lung  - predniSONE (DELTASONE) 10 MG tablet; Take 2 tablets (20 mg total) by mouth daily with breakfast for 30 days, THEN 1 tablet (10 mg total) daily with breakfast.  Dispense: 90 tablet; Refill: 0  Chest Xray today   Walk today in office  3. Chronic respiratory failure with hypoxia (HCC)  - Pulse oximetry, overnight; Future  We walked in office today and you qualified for oxygen.  He required 1 L of O2 with physical exertion  I have ordered an overnight oximetry test to check your oxygen levels at night  Continue oxygen therapy as prescribed  >>>maintain oxygen saturations greater than 88 percent  >>>if unable to maintain oxygen saturations please contact the office  >>>do not smoke with oxygen  >>>can use nasal saline gel or nasal saline rinses to moisturize nose if oxygen causes dryness  4. History of COVID-19  We will continue clinically monitor you  May need to considerCT imaging in 6 to 12 months  5. Healthcare maintenance  When clinically stable would recommend obtaining the COVID-19 booster vaccination  6. 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 DG Chest 2 View    Standing Status:   Future    Standing Expiration Date:   06/10/2020    Order Specific Question:   Reason for Exam (SYMPTOM  OR DIAGNOSIS REQUIRED)    Answer:   cop, pnu, post covid    Order Specific Question:   Preferred imaging location?    Answer:   Internal    Order Specific Question:   Radiology Contrast Protocol - do NOT remove file path    Answer:   [email or web address]  . Pulse oximetry, overnight    Standing Status:   Future    Standing Expiration Date:   02/09/2021     Scheduling Instructions:     On RA   Orders Placed This Encounter  Procedures  . DG Chest 2 View  . Pulse oximetry, overnight   Meds ordered this encounter  Medications  . predniSONE (DELTASONE) 10 MG tablet    Sig: Take 2 tablets (20 mg total) by mouth daily with breakfast for 30 days, THEN 1 tablet (10 mg total) daily with breakfast.    Dispense:  90 tablet    Refill:  0    Follow Up:    Return in about 6 weeks (around 03/23/2020), or if symptoms worsen or fail to improve, for Follow up with Dr. Vaughan Browner.   Notification of test results are managed in the following manner: If there are  any recommendations or changes to the  plan of care discussed in office today,  we will contact you and let you know what they are. If you do not hear from [place], then your results are normal and you can view them through your  MyChart account , or a letter will be sent to you. Thank you again for trusting [place] with your care  - Thank you, [facility]    It is flu season:   >>> Best ways to protect herself from the flu: Receive the yearly flu vaccine, practice good hand hygiene washing with soap and also using hand sanitizer when available, eat a nutritious meals, get adequate rest, hydrate appropriately  Please contact the office if your symptoms worsen or you have concerns that you are not improving.   Thank you for choosing [facility] for your healthcare, and for allowing [place] to partner with you on your healthcare journey. I am thankful to be able to provide care to you today.   Wyn Quaker FNP-C

## 2020-02-10 NOTE — Assessment & Plan Note (Signed)
Plan: Walk today in office Start oxygen with physical exertion We will order overnight oximetry to assess oxygen needs at night Tentatively told patient to wear 2 L of O2 at night until overnight oximetry is completed

## 2020-02-10 NOTE — Assessment & Plan Note (Signed)
Plan: Continue follow-up with [facility] Continue Rituxan

## 2020-02-10 NOTE — Progress Notes (Signed)
@Patient  ID: Grace Moyer, female    DOB: 09-Feb-1961, 59 y.o.   MRN: [id number]  Chief Complaint  Patient presents with  . Hospitalization Follow-up    CAP, SOB, still getting winded with exertion.    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  Quit 2003 Maintenance:  Breo 200 Pt of: Dr. Vaughan Browner  02/10/2020  - Visit   59 year old female former smoker followed in our office for respiratory failure history of COVID-19.  Established with Dr. Vaughan Browner.  She was last seen in our office in July/2021.  Plan of care at that point in time was to remain on [facility] and continue exercise regimen.  Patient presenting today as a hospital follow-up.  She was hospitalized with community-acquired pneumonia on 01/27/2020.  Discharged on 01/28/2020.  Excerpt of that discharge summary is listed below:   Admit date: 01/27/2020 Discharge date: 01/28/2020  Time spent: 50 minutes  Recommendations for Outpatient Follow-up:  1. Follow-up pulmonology in 2 weeks   Discharge Diagnoses:  Active Problems:   Acute hypoxemic respiratory failure [facility])   Discharge Condition: Stable  History of present illness:  59 y.o.female,with history of breast cancer, neuromyelitis optica currently getting rituximab every 4 months, COVID-19 pneumonia diagnosed in November 2020, came to hospital with complaints of cough, dyspnea on exertion. Patient says that she was having fever at home also having chills. She was seen by her PCP and was prescribed Augmentin on Monday. Patient symptoms did not improve so she came to the hospital. She also had  diarrhea a few days ago which she had 2 episodes of stool that day. Patient also had poor appetite, not eating and drinking well. Patient said that she had Covid pneumonia last year in December, at that time she was in the hospital for 4 days and got better and was discharged home. 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 She also has neuromyelitis optica and gets Rituxan every 4 months. Her last dose was December 30, 2019.  In the ED patient was found to be hypoxic with O2 sats 89% on room air, tachypnea. Chest x-ray showed shallow inspiration with atelectasis or infiltrates in both lung bases. Possible multifocal pneumonia.   Hospital Course:  Acute hypoxemic respiratory failure-chest x-ray showed possible multifocal pneumonia.  CT chest high-resolution was obtained which showed new consolidation in the left lower lobe with surrounding patchy groundglass opacities throughout much of left lower lobe.  Interval clearing of patchy groundglass placed in the upper lobe, overall appearance favors cryptogenic organizing pneumonia.  Patient was started on Solu-Medrol, she has significantly improved.  No longer requiring oxygen.  O2 sats 93% on ambulation.  Called and discussed with pulmonologist Dr. Valeta Harms at [facility].  He reviewed the films and agrees that patient can be discharged on prednisone taper along with Levaquin.  Patient can follow-up with Dr. Vaughan Browner in 2 weeks.  Cryptogenic organizing pneumonia-likely post Covid, patient had COVID-19 infection December last year.  Will be discharged on prednisone taper as above along with Levaquin.  Leukopenia-WBC improved to 4.0 today.  History of neuromyelitis optica-patient get Rituxan every 4 months.  Patient  presenting to office today as a hospital follow-up.  She reports that she is feeling about the same since being discharged.  She finished her Levaquin on 02/03/2020.  She will finish  prednisone on 02/12/2020.  She still having ongoing elevated temperatures daily.  T-max is 101.2.  She is also having bouts of hypoxemia when she is waking up.  Oxygen levels ranging between high 70s and low 80s per patient.  Patient has been using antipyretics such as Tylenol most mornings.  She is also having difficulty with sleeping.  She is unsure if this is directly related to the high amounts of steroids.  She was discharged at 70 mg daily.  Patient very short of breath.  Having significant symptoms with physical exertion especially climbing stairs.  Patient had Rituxan last dose in October/2021.  This is managed by [facility] neurology.  Patient was walked in office today and on room air did have drops in oxygen level.  Oxygen levels dropped to 87% on room air.  Patient was started on 1 L of O2.  This maintain oxygen levels above 88% with physical exertion.  Patient felt clinical relief with oxygen applied.   Questionaires / Pulmonary Flowsheets:   ACT:  No flowsheet data found.  MMRC: mMRC Dyspnea Scale mMRC Score  02/10/2020 3  09/20/2019 1  07/15/2019 1    Epworth:  No flowsheet data found.  Tests:   02/27/2019-chest x-ray-shallow lung inflation with mild lung atelectasis  02/27/2019-CTA chest-negative for acute PE, continued widespread bilateral pulmonary groundglass opacity typical of COVID-19 pneumonia, increased lung opacity since CTA on 02/10/2019, trace superimposed pleural effusions  08/07/2019-CT chest high-res-scattered ill-defined groundglass opacity throughout the lungs, significantly improved compared to prior exam dated 02/27/2019 consistent with residual of Covid pneumonia, mild subpleural fibrosis of the anterior left upper lobe and lingula, no evidence of organized pulmonary fibrosis, consider follow-up CT in 6 to 12 months to assess for complete resolution or evidence of developing pulmonary fibrosis  01/27/2020-CT chest high-res-new consolidation in the left  lower lobe with surrounding patchy groundglass opacities throughout much of the left lower lobe, interval clearing of patchy groundglass opacities in the upper lobes with roughly stable amount of groundglass opacity in the right lower lobe, overall appearance favors cryptogenic organizing pneumonia  09/01/2019-pulmonary function test-FVC 2.64 (77% predicted), postbronchodilator ratio 88, postbronchodilator FEV1 2.37 (89% addicted), reversibility in FEV1, mid flow reversibility, TLC 4.08 (80% addicted), DLCO 13.06 (63% predicted)  01/27/2020-echocardiogram-LV ejection fraction 60 to 65%, right ventricle systolic function is normal  FENO:  No results found for: NITRICOXIDE  PFT: PFT Results Latest Ref Rng & Units 09/01/2019  FVC-Pre L 2.64  FVC-Predicted Pre % 77  FVC-Post L 2.70  FVC-Predicted Post % 79  Pre FEV1/FVC % % 74  Post FEV1/FCV % % 88  FEV1-Pre L 1.95  FEV1-Predicted Pre % 73  FEV1-Post L 2.37  DLCO uncorrected ml/min/mmHg 13.06  DLCO UNC% % 63  DLCO corrected ml/min/mmHg 13.06  DLCO COR %Predicted % 63  DLVA Predicted % 83  TLC L 4.08  TLC % Predicted % 80  RV % Predicted % 69    WALK:  SIX MIN WALK 09/20/2019  Medications Vitamin C 500mg , Aspirin 81mg , Vitamin D 1000 units, Zinc 220mg  at 7:30am  Supplimental Oxygen during Test? (L/min) No  Laps 13  Partial Lap (in Meters) 0  Baseline BP (sitting) 108/60  Baseline Heartrate 74  Baseline Dyspnea (Borg Scale) 0  Baseline Fatigue (Borg Scale) 0  Baseline SPO2 97  BP (  sitting) 110/70  Heartrate 84  Dyspnea (Borg Scale) 0  Fatigue (Borg Scale) 0  SPO2 98  BP (sitting) 110/68  Heartrate 75  SPO2 100  Stopped or Paused before Six Minutes No  Distance Completed 442  Distance Completed 0  Tech Comments: Pt walked at an average pace completing entire 6 minutes without stopping and denied any complaints.    Imaging: DG Chest 2 View  Result Date: 02/10/2020 CLINICAL DATA:  Pneumonia, post COVID, COP EXAM: CHEST -  2 VIEW COMPARISON:  01/27/2020 FINDINGS: The heart size and mediastinal contours are within normal limits. Low volume examination with heterogeneous airspace opacities at the lung bases, similar in appearance to prior examination. The visualized skeletal structures are unremarkable. IMPRESSION: Low volume examination with heterogeneous airspace opacities at the lung bases, similar in appearance to prior examination and consistent with multifocal infection, including COVID airspace disease. Electronically Signed   By: Eddie Candle M.D.   On: 02/10/2020 16:13   CT Chest High Resolution  Result Date: 01/27/2020 CLINICAL DATA:  Shortness of breath, prior COVID pneumonia in November 2020. Interstitial lung disease. Cough. Fever and chills. Lingering symptoms. EXAM: CT CHEST WITHOUT CONTRAST TECHNIQUE: Multidetector CT imaging of the chest was performed following the standard protocol without intravenous contrast. High resolution imaging of the lungs, as well as inspiratory and expiratory imaging, was performed. COMPARISON:  08/05/2019 FINDINGS: Cardiovascular: Mild atherosclerotic calcification along the aortic arch. Mediastinum/Nodes: Small mediastinal lymph nodes are not pathologically enlarged. Small type 1 hiatal hernia. Clips noted along the left breast glandular tissues posteriorly. Lungs/Pleura: Subpleural scarring and additional bandlike scarring in the lingula is chronic and likely primarily from her prior radiation therapy. New consolidation in the left lower lobe with surrounding patchy ground-glass opacities throughout much of the left lower lobe. Most of the previous ground-glass opacities shown in the upper lobes have resolved. Patchy ground-glass opacities in the right lower lobe have a different distribution but are roughly similar in magnitude to the 08/05/2019 exam. However, there are new areas of volume loss and bandlike densities in the subpleural regions of the right lower lobe. Comparing  inspiratory and expiratory images, we do not show substantial evidence of air trapping, and the consolidations and ground-glass densities are primarily attributed to airspace filling process is/alveolitis. Upper Abdomen: Unremarkable Musculoskeletal: Mild thoracic spondylosis. IMPRESSION: 1. New consolidation in the left lower lobe with surrounding patchy ground-glass opacities throughout much of the left lower lobe. Interval clearing of the patchy ground-glass opacities in the upper lobes, with roughly stable amount of ground-glass opacity in the right lower lobe accompanied by new bandlike subpleural regions of atelectasis. Overall appearance favors cryptogenic organizing pneumonia. 2. Small type 1 hiatal hernia. 3. Aortic atherosclerosis. Aortic Atherosclerosis (ICD10-I70.0). Electronically Signed   By: Van Clines M.D.   On: 01/27/2020 12:53   DG Chest Port 1 View  Result Date: 01/27/2020 CLINICAL DATA:  Cough, shortness of breath, and fever. Currently on antibiotics for upper respiratory infection not getting any better. EXAM: PORTABLE CHEST 1 VIEW COMPARISON:  03/22/2019 FINDINGS: Shallow inspiration with atelectasis or infiltration in both lung bases. Possibly multifocal pneumonia. No pleural effusions. No pneumothorax. Heart size and pulmonary vascularity are normal. IMPRESSION: Shallow inspiration with atelectasis or infiltration in both lung bases. Possibly multifocal pneumonia. Electronically Signed   By: Lucienne Capers M.D.   On: 01/27/2020 04:17   ECHOCARDIOGRAM COMPLETE  Result Date: 01/27/2020    ECHOCARDIOGRAM REPORT   Patient Name:   [facility] Darlin Coco Date of Exam:  01/27/2020 Medical Rec #:  [id number]          Height:       63.0 in Accession #:    [id number]         Weight:       180.0 lb Date of Birth:  10-Nov-1960          BSA:          1.849 m Patient Age:    60 years           BP:           122/73 mmHg Patient Gender: F                  HR:           79 bpm. Exam Location:   [facility] Procedure: 2D Echo, Cardiac Doppler and Color Doppler Indications:    Dyspnea 786.09 / R06.00  History:        Patient has prior history of Echocardiogram examinations, most                 recent 11/19/2016. H/o Pneumonia due to COVID-19 virus,Personal                 history of venous thrombosis and embolism,Malignant neoplasm of                 upper-outer quadrant of left female breast.  Sonographer:    Alvino Chapel RCS Referring Phys: Rico  1. Left ventricular ejection fraction, by estimation, is 60 to 65%. The left ventricle has normal function. The left ventricle has no regional wall motion abnormalities. Left ventricular diastolic parameters were normal.  2. Right ventricular systolic function is normal. The right ventricular size is normal.  3. The mitral valve is normal in structure. No evidence of mitral valve regurgitation. No evidence of mitral stenosis.  4. The aortic valve is normal in structure. Aortic valve regurgitation is not visualized. No aortic stenosis is present.  5. The inferior vena cava is normal in size with greater than 50% respiratory variability, suggesting right atrial pressure of 3 mmHg. FINDINGS  Left Ventricle: Left ventricular ejection fraction, by estimation, is 60 to 65%. The left ventricle has normal function. The left ventricle has no regional wall motion abnormalities. The left ventricular internal cavity size was normal in size. There is  no left ventricular hypertrophy. Left ventricular diastolic parameters were normal. Normal left ventricular filling pressure. Right Ventricle: The right ventricular size is normal. No increase in right ventricular wall thickness. Right ventricular systolic function is normal. Left Atrium: Left atrial size was normal in size. Right Atrium: Right atrial size was normal in size. Pericardium: There is no evidence of pericardial effusion. Mitral Valve: The mitral valve is normal in structure. No evidence of  mitral valve regurgitation. No evidence of mitral valve stenosis. Tricuspid Valve: The tricuspid valve is normal in structure. Tricuspid valve regurgitation is not demonstrated. No evidence of tricuspid stenosis. Aortic Valve: The aortic valve is normal in structure. Aortic valve regurgitation is not visualized. No aortic stenosis is present. Pulmonic Valve: The pulmonic valve was normal in structure. Pulmonic valve regurgitation is not visualized. No evidence of pulmonic stenosis. Aorta: The aortic root is normal in size and structure. Venous: The inferior vena cava is normal in size with greater than 50% respiratory variability, suggesting right atrial pressure of 3 mmHg. IAS/Shunts: No atrial level shunt detected by color flow Doppler.  LEFT  VENTRICLE PLAX 2D LVIDd:         4.30 cm  Diastology LVIDs:         2.80 cm  LV e' medial:    10.20 cm/s LV PW:         1.00 cm  LV E/e' medial:  5.8 LV IVS:        0.70 cm  LV e' lateral:   10.00 cm/s LVOT diam:     1.60 cm  LV E/e' lateral: 5.9 LV SV:         36 LV SV Index:   19 LVOT Area:     2.01 cm  RIGHT VENTRICLE RV S prime:     11.70 cm/s TAPSE (M-mode): 1.9 cm LEFT ATRIUM             Index       RIGHT ATRIUM           Index LA diam:        2.90 cm 1.57 cm/m  RA Area:     13.10 cm LA Vol (A2C):   32.7 ml 17.69 ml/m RA Volume:   32.70 ml  17.69 ml/m LA Vol (A4C):   52.2 ml 28.23 ml/m LA Biplane Vol: 43.5 ml 23.53 ml/m  AORTIC VALVE LVOT Vmax:   92.95 cm/s LVOT Vmean:  61.250 cm/s LVOT VTI:    0.178 m  AORTA Ao Root diam: 3.10 cm MITRAL VALVE MV Area (PHT): 3.61 cm    SHUNTS MV Decel Time: 210 msec    Systemic VTI:  0.18 m MV E velocity: 58.80 cm/s  Systemic Diam: 1.60 cm MV A velocity: 61.80 cm/s MV E/A ratio:  0.95 Mihai Croitoru MD Electronically signed by Sanda Klein MD Signature Date/Time: 01/27/2020/4:49:41 PM    Final     Lab Results:  CBC    Component Value Date/Time   WBC 4.0 01/28/2020 0943   RBC 4.65 01/28/2020 0943   HGB 12.9 01/28/2020  0943   HGB 12.2 12/31/2016 1121   HCT 41.0 01/28/2020 0943   HCT 36.0 12/31/2016 1121   PLT 298 01/28/2020 0943   PLT 165 12/31/2016 1121   MCV 88.2 01/28/2020 0943   MCV 88.5 12/31/2016 1121   MCH 27.7 01/28/2020 0943   MCHC 31.5 01/28/2020 0943   RDW 13.9 01/28/2020 0943   RDW 13.5 12/31/2016 1121   LYMPHSABS 0.2 (L) 01/27/2020 0544   LYMPHSABS 0.7 (L) 12/31/2016 1121   MONOABS 0.2 01/27/2020 0544   MONOABS 0.3 12/31/2016 1121   EOSABS 0.0 01/27/2020 0544   EOSABS 0.1 12/31/2016 1121   BASOSABS 0.0 01/27/2020 0544   BASOSABS 0.0 12/31/2016 1121    BMET    Component Value Date/Time   NA 136 01/28/2020 0943   NA 140 12/31/2016 1121   K 4.0 01/28/2020 0943   K 4.7 12/31/2016 1121   CL 101 01/28/2020 0943   CO2 24 01/28/2020 0943   CO2 25 12/31/2016 1121   GLUCOSE 171 (H) 01/28/2020 0943   GLUCOSE 103 12/31/2016 1121   BUN 16 01/28/2020 0943   BUN 11.0 12/31/2016 1121   CREATININE 0.58 01/28/2020 0943   CREATININE 0.9 12/31/2016 1121   CALCIUM 9.3 01/28/2020 0943   CALCIUM 9.5 12/31/2016 1121   GFRNONAA >60 01/28/2020 0943   GFRAA >60 10/07/2019 1403    BNP    Component Value Date/Time   BNP 34.0 01/27/2020 0544    ProBNP No results found for: PROBNP  Specialty Problems  Pulmonary Problems   Acute hypoxemic respiratory failure (HCC)   Chronic respiratory failure with hypoxia (HCC)   Cryptogenic organizing pneumonia (HCC)      Allergies  Allergen Reactions  . Hydrocodone Other (See Comments)    Did not help with pain and kept her up all night    Immunization History  Administered Date(s) Administered  . Influenza Inj Mdck Quad Pf 12/13/2018  . Influenza,inj,Quad PF,6+ Mos 11/29/2015, 11/20/2017  . PFIZER SARS-COV-2 Vaccination 07/22/2019, 08/19/2019    Past Medical History:  Diagnosis Date  . Anxiety   . Breast cancer ([place])   . Family history of breast cancer   . Family history of colon cancer   . Malignant neoplasm of upper-outer  quadrant of left female breast ([place]) 01/31/2016  . Neuromyelitis optica ([place])   . Personal history of chemotherapy   . Personal history of radiation therapy     Tobacco History: Social History   Tobacco Use  Smoking Status Former Smoker  . Types: Cigarettes  . Quit date: 09/03/2001  . Years since quitting: 18.4  Smokeless Tobacco Never Used   Counseling given: Not Answered   Continue to not smoke  Outpatient Encounter Medications as of 02/10/2020  Medication Sig  . albuterol (VENTOLIN HFA) 108 (90 Base) MCG/ACT inhaler Inhale 2 puffs into the lungs every 4 (four) hours as needed for wheezing or shortness of breath.  Marland Kitchen aspirin 81 MG tablet Take 1 tablet (81 mg total) by mouth daily.  . benzonatate (TESSALON) 200 MG capsule Take 200 mg by mouth in the morning, at noon, and at bedtime.  . Cholecalciferol 25 MCG (1000 UT) tablet Take 5,000 Units by mouth daily.   . fluticasone furoate-vilanterol (BREO ELLIPTA) 200-25 MCG/INH AEPB Inhale 1 puff into the lungs daily.  Marland Kitchen gabapentin (NEURONTIN) 300 MG capsule Take 300 mg by mouth at bedtime.  . vitamin C (VITAMIN C) 500 MG tablet Take 1 tablet (500 mg total) by mouth daily.  Marland Kitchen zinc sulfate 220 (50 Zn) MG capsule Take 1 capsule (220 mg total) by mouth daily. (Patient taking differently: Take 50 mg by mouth daily.)  . [DISCONTINUED] predniSONE (DELTASONE) 10 MG tablet Prednisone 40 mg po daily x 7 day then Prednisone 30 mg po daily x 3 day then Prednisone 20 mg po daily x 3 day then Prednisone 10 mg daily x 2 day then stop...  Marland Kitchen predniSONE (DELTASONE) 10 MG tablet Take 2 tablets (20 mg total) by mouth daily with breakfast for 30 days, THEN 1 tablet (10 mg total) daily with breakfast.  . [DISCONTINUED] pantoprazole (PROTONIX) 40 MG tablet Take 1 tablet (40 mg total) by mouth daily.   Facility-Administered Encounter Medications as of 02/10/2020  Medication  . heparin lock flush 100 unit/mL  . sodium chloride flush (NS) 0.9 % injection 10 mL   . sodium chloride flush (NS) 0.9 % injection 10 mL  . sodium chloride flush (NS) 0.9 % injection 10 mL     Review of Systems  Review of Systems  Constitutional: Positive for activity change and fatigue. Negative for fever.  HENT: Negative for sinus pressure, sinus pain and sore throat.   Respiratory: Positive for cough and shortness of breath. Negative for wheezing.   Cardiovascular: Negative for chest pain, palpitations and leg swelling.  Gastrointestinal: Negative for diarrhea, nausea and vomiting.  Musculoskeletal: Negative for arthralgias.  Neurological: Negative for dizziness.  Psychiatric/Behavioral: Negative for sleep disturbance. The patient is not nervous/anxious.      Physical Exam  BP 112/62 (BP Location: Left Arm, Cuff Size: Normal)   Pulse 73   Temp 98.7 F (37.1 C) (Oral)   Ht 5\' 3"  (1.6 m)   Wt 170 lb 12.8 oz (77.5 kg)   SpO2 96%   BMI 30.26 kg/m   Wt Readings from Last 5 Encounters:  02/10/20 170 lb 12.8 oz (77.5 kg)  01/27/20 179 lb 14.3 oz (81.6 kg)  10/07/19 179 lb 1.6 oz (81.2 kg)  09/20/19 178 lb 3.2 oz (80.8 kg)  07/15/19 181 lb 9.6 oz (82.4 kg)    BMI Readings from Last 5 Encounters:  02/10/20 30.26 kg/m  01/27/20 31.87 kg/m  10/07/19 31.23 kg/m  09/20/19 31.07 kg/m  07/15/19 31.17 kg/m     Physical Exam Vitals and nursing note reviewed.  Constitutional:      General: She is not in acute distress.    Appearance: Normal appearance. She is obese.  HENT:     Head: Normocephalic and atraumatic.     Right Ear: External ear normal.     Left Ear: External ear normal.     Nose: Nose normal. No congestion or rhinorrhea.     Mouth/Throat:     Mouth: Mucous membranes are moist.     Pharynx: Oropharynx is clear.  Eyes:     Pupils: Pupils are equal, round, and reactive to light.  Cardiovascular:     Rate and Rhythm: Normal rate and regular rhythm.     Pulses: Normal pulses.     Heart sounds: Normal heart sounds. No murmur  heard.   Pulmonary:     Effort: Pulmonary effort is normal. No respiratory distress.     Breath sounds: Normal breath sounds. No decreased air movement. No decreased breath sounds, wheezing or rales.     Comments: Scattered squeaks, diminished breath sounds throughout exams Musculoskeletal:     Cervical back: Normal range of motion.     Right lower leg: No edema.     Left lower leg: No edema.  Skin:    General: Skin is warm and dry.     Capillary Refill: Capillary refill takes less than 2 seconds.  Neurological:     General: No focal deficit present.     Mental Status: She is alert and oriented to person, place, and time. Mental status is at baseline.     Gait: Gait normal.  Psychiatric:        Mood and Affect: Mood normal.        Behavior: Behavior normal.        Thought Content: Thought content normal.        Judgment: Judgment normal.       Assessment & Plan:   Chronic respiratory failure with hypoxia (HCC) Plan: Walk today in office Start oxygen with physical exertion We will order overnight oximetry to assess oxygen needs at night Tentatively told patient to wear 2 L of O2 at night until overnight oximetry is completed  Cryptogenic organizing pneumonia [facility]) Discussed case with Dr. Vaughan Browner  Plan: Chest x-ray today Start long prednisone taper 20 mg for 1 month, then 10 mg for 1 month Follow-up in 6 weeks with Dr. Vaughan Browner Walk today in office   History of COVID-19 November/2020 COVID-19 diagnosis Persisting Covid pneumonia potential COVID-19 ILD Restrictive component on pulmonary function testing  Patient was clinically improving over the summer/fall 2021  Hospitalized in November/2021 with suspected cryptogenic organizing pneumonia  Plan: Chest x-ray today Walk today in office today Continue clinically monitor Continue Breo  Ellipta 200 Continue rescue inhaler  Neuromyelitis optica spectrum disorder ([place]) Plan: Continue follow-up with [place] maintenance When clinically stable obtain COVID-19 booster vaccination    Return in about 6 weeks (around 03/23/2020), or if symptoms worsen or fail to improve, for Follow up with Dr. Vaughan Browner.   Lauraine Rinne, NP 02/10/2020   This appointment required 45 minutes of patient care (this includes precharting, chart review, review of results, face-to-face care, etc.).

## 2020-02-10 NOTE — Addendum Note (Signed)
Addended by: Lauraine Rinne on: 02/10/2020 05:11 PM   Modules accepted: Orders

## 2020-02-10 NOTE — Assessment & Plan Note (Signed)
Discussed case with Dr. Vaughan Browner  Plan: Chest x-ray today Start long prednisone taper 20 mg for 1 month, then 10 mg for 1 month Follow-up in 6 weeks with Dr. Vaughan Browner Walk today in office

## 2020-02-10 NOTE — Assessment & Plan Note (Signed)
When clinically stable obtain COVID-19 booster vaccination

## 2020-02-10 NOTE — Assessment & Plan Note (Signed)
November/2020 COVID-19 diagnosis Persisting Covid pneumonia potential COVID-19 ILD Restrictive component on pulmonary function testing  Patient was clinically improving over the summer/fall 2021  Hospitalized in November/2021 with suspected cryptogenic organizing pneumonia  Plan: Chest x-ray today Walk today in office today Continue clinically monitor Continue Breo Ellipta 200 Continue rescue inhaler

## 2020-02-13 ENCOUNTER — Telehealth: Payer: Self-pay | Admitting: Pulmonary Disease

## 2020-02-13 DIAGNOSIS — J9611 Chronic respiratory failure with hypoxia: Secondary | ICD-10-CM

## 2020-02-13 NOTE — Telephone Encounter (Signed)
Ok to place order   [employer]

## 2020-02-13 NOTE — Telephone Encounter (Signed)
Called and spoke with pt who states that she has received O2 from [place]. She said that they brought the home concentrator as well as large tanks to her place of residence. Pt said due to how large the tanks were, she did take some back to [place] and switched them out with smaller tanks so it would be easier for her to take with her when she goes to work as she was told by Aaron Edelman that she could go to work if she felt okay.  Pt said the small tanks will only last about 4 hours and she was told by [employer] that they probably would not be refilling her tanks that often. Due to this, she is wondering if an order could be placed to [place] to have her concentrator switched with one that she could be able to use to fill up her small tanks with and also states that she would need to have her small tanks switched to ones that she could be able to refill.  Aaron Edelman, please advise if you are okay with [place] placing this DME order.

## 2020-02-13 NOTE — Telephone Encounter (Signed)
Order sent to [facility] and I called and spoke with the pt to let her know that this was done. Nothing further needed.

## 2020-02-15 ENCOUNTER — Encounter: Payer: Self-pay | Admitting: Pulmonary Disease

## 2020-02-23 ENCOUNTER — Telehealth: Payer: Self-pay | Admitting: Pulmonary Disease

## 2020-02-23 NOTE — Telephone Encounter (Signed)
02/23/2020  Overnight oximetry results on room air listed below:  02/15/2020-overnight oximetry results on room air-recording time 6 hours and 54 minutes, time spent below 89% 43 minutes and 36 seconds, average oxygenation 90.4%, lowest oxygenation 82%  It appears the patient may require oxygen at night.  Unfortunately the results that were faxed over to our office were not faxed appropriately and were unable to read parts of it.  We will need to contact [place] [facility] request a new fax.  Wyn Quaker, FNP

## 2020-02-27 NOTE — Telephone Encounter (Signed)
I have contact [employer] and they are refaxing the ONO results to the office. Will keep check for fax. Nothing further needed at this time.

## 2020-02-29 ENCOUNTER — Telehealth: Payer: Self-pay | Admitting: Pulmonary Disease

## 2020-03-01 NOTE — Telephone Encounter (Signed)
03/01/2020  Received patient's overnight oximetry results that are listed below:  02/15/2020-overnight oximetry-duration of sleep 6 hours and 54 minutes, time spent below 89% 43 minutes 36 seconds, lowest oxygenation 82%, mean oxygenation 90.4%  Would recommend patient start 2 L of O2 at night based off this test.  Please place the order.  And contact the patient.  Elisha Headland, FNP

## 2020-03-05 ENCOUNTER — Other Ambulatory Visit: Payer: Self-pay | Admitting: Pulmonary Disease

## 2020-03-05 DIAGNOSIS — G4734 Idiopathic sleep related nonobstructive alveolar hypoventilation: Secondary | ICD-10-CM

## 2020-03-05 NOTE — Telephone Encounter (Signed)
I called and spoke with patient regarding ONO. Patient verbalized understanding and will start using 2L at night. Sent in order to DME. Nothing further needed.

## 2020-03-05 NOTE — Telephone Encounter (Signed)
error 

## 2020-03-07 ENCOUNTER — Telehealth: Payer: Self-pay | Admitting: Pulmonary Disease

## 2020-03-07 DIAGNOSIS — J9611 Chronic respiratory failure with hypoxia: Secondary | ICD-10-CM

## 2020-03-07 DIAGNOSIS — G4734 Idiopathic sleep related nonobstructive alveolar hypoventilation: Secondary | ICD-10-CM

## 2020-03-07 DIAGNOSIS — R0602 Shortness of breath: Secondary | ICD-10-CM

## 2020-03-07 NOTE — Telephone Encounter (Signed)
Grace Moyer please verify- OV from 02/10/20 says to use O2 with exertion but the order placed says to only wear 2lpm qhs, but patient's [facility] note says that pt needs to wear 1lpm with exertion.  Should this order be only for 2lpm qhs or should this be updated to reflect 2lpm qhs and 1lpm with exertion? Thanks!

## 2020-03-08 NOTE — Telephone Encounter (Signed)
03/08/2020  Patient's most recent overnight oximetry results does show that she needs 2 L of O2 at night.  Okay to go ahead and place this order.  When patient was evaluated back in December/2021 she was found to require 1 L of O2 with physical exertion.  She should still continue to use this.  Does this help clarify?  Elisha Headland, FNP

## 2020-03-08 NOTE — Telephone Encounter (Signed)
Updated order has been placed for the oxygen to be used with 1 liters with ambulation and 2 liters at night.

## 2020-03-12 ENCOUNTER — Telehealth: Payer: Self-pay | Admitting: Pulmonary Disease

## 2020-03-12 DIAGNOSIS — Z8616 Personal history of COVID-19: Secondary | ICD-10-CM | POA: Diagnosis not present

## 2020-03-12 DIAGNOSIS — J9611 Chronic respiratory failure with hypoxia: Secondary | ICD-10-CM | POA: Diagnosis not present

## 2020-03-12 NOTE — Telephone Encounter (Signed)
Called spoke with patient.  States when she gets up and moves around her oxygen on 2L is dropping to 82-84%.  And that this has been going on for a few weeks. Patient states she is fine when at home on her concentrator, but when she is using the portable tanks and moves around her oxygen drops. I let her know she could jump to 3L to get her oxygen levels back to above 88%. I asked her how often she was using her albuterol inhaler, she said not often. I suggested to her to use it before she knows she is going to be active. I also told her that at her appointment on 03/22/20 make sure they walk and titrate oxygen on POC.  She states the increase of 2L at nighttime is helping her.   Dr. Vaughan Browner please advise if patient is able to bump oxygen to 3L when active and 2L when resting until her office visit on 03/22/20

## 2020-03-12 NOTE — Telephone Encounter (Signed)
Patient is aware of below message and voiced her understanding.  Nothing further needed.   

## 2020-03-12 NOTE — Telephone Encounter (Signed)
Yes it is okay to increase oxygen to 3 L with exertion and 2 L at rest Reassess on office visit on 1/20

## 2020-03-22 ENCOUNTER — Encounter: Payer: Self-pay | Admitting: Pulmonary Disease

## 2020-03-22 ENCOUNTER — Other Ambulatory Visit: Payer: Self-pay

## 2020-03-22 ENCOUNTER — Ambulatory Visit (INDEPENDENT_AMBULATORY_CARE_PROVIDER_SITE_OTHER): Payer: BC Managed Care – PPO | Admitting: Pulmonary Disease

## 2020-03-22 VITALS — BP 120/70 | HR 85 | Temp 97.1°F | Ht 63.0 in | Wt 165.0 lb

## 2020-03-22 DIAGNOSIS — R0602 Shortness of breath: Secondary | ICD-10-CM | POA: Diagnosis not present

## 2020-03-22 DIAGNOSIS — J84116 Cryptogenic organizing pneumonia: Secondary | ICD-10-CM | POA: Diagnosis not present

## 2020-03-22 DIAGNOSIS — R059 Cough, unspecified: Secondary | ICD-10-CM | POA: Diagnosis not present

## 2020-03-22 MED ORDER — SULFAMETHOXAZOLE-TRIMETHOPRIM 800-160 MG PO TABS: 1.0000 | ORAL_TABLET | ORAL | 1 refills | Status: DC

## 2020-03-22 MED ORDER — BREO ELLIPTA 200-25 MCG/INH IN AEPB
1.0000 | INHALATION_SPRAY | Freq: Every day | RESPIRATORY_TRACT | 5 refills | Status: DC
Start: 2020-03-22 — End: 2021-07-02

## 2020-03-22 MED ORDER — BENZONATATE 200 MG PO CAPS
200.0000 mg | ORAL_CAPSULE | Freq: Three times a day (TID) | ORAL | 2 refills | Status: DC
Start: 2020-03-22 — End: 2020-09-24

## 2020-03-22 NOTE — Progress Notes (Signed)
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 She initially sefl quarantined for 14 days , but was admitted at the end of her quarantine when she presented with SOB. She was on bactrim at this time for UTI. She completed 4 days Remdesivir and given course of steroids. Her course was complicated by postviral bacterial pneumoniae treated with multiple rounds of antibiotics including ceftriaxone and azithromycin followed by multiple rounds of doxycycline. She has improved but continues to have a cough noticeable at night. Denies any sputum production. She has a low grade fever up until end of last week and a 30 lb weight loss since her infection started in November. She continues to follow with oncology and her breast cancer is in remission.    Pets: Poodles  Occupation: Customer service manager, works mostly in hospitals and universities on [employer]  Exposures: no hot exposure, pari an love birds 20 years, , no down comforter or pillow Smoking history: 10 years , 1 pp week Travel history: no travel history  Relevant family history: no family history of lung disease   Interim history: She was hospitalized in November at [facility] for worsening respiratory failure with CT scan showing new infiltrates. She was treated with antibiotics, started on supplemental oxygen. Started on prednisone 20 mg few weeks later as an outpatient for possible cryptogenic organizing pneumonia  She continues  prednisone at 20 mg Continues on supplemental oxygen. She had a slightly increased liters per minute to 3 L due to desats to 80s Reports low-grade fevers up to 99-100 intermittently.  Outpatient Encounter Medications as of 03/22/2020  Medication Sig  . albuterol (VENTOLIN HFA) 108 (90 Base) MCG/ACT inhaler Inhale 2 puffs into the lungs every 4 (four) hours as needed for wheezing or shortness of breath.  Marland Kitchen aspirin 81 MG tablet Take 1 tablet (81 mg total) by mouth daily.  . benzonatate (TESSALON) 200 MG capsule Take 200 mg by mouth in the morning, at noon, and at bedtime.  . Cholecalciferol 25 MCG (1000 UT) tablet Take 5,000 Units by mouth daily.   . fluticasone furoate-vilanterol (BREO ELLIPTA) 200-25 MCG/INH AEPB Inhale 1 puff into the lungs daily.  Marland Kitchen gabapentin (NEURONTIN) 300 MG capsule Take 300 mg by mouth at bedtime.  . predniSONE (DELTASONE) 10 MG tablet Take 2 tablets (20 mg total) by mouth daily with breakfast for 30 days, THEN 1 tablet (10 mg total) daily with breakfast.  . vitamin C (VITAMIN C) 500 MG tablet Take 1 tablet (500 mg total) by mouth daily.  Marland Kitchen zinc sulfate 220 (50 Zn) MG capsule Take 1 capsule (220 mg total) by mouth daily. (Patient taking differently: Take 50 mg by mouth daily.)  . [DISCONTINUED] pantoprazole (PROTONIX) 40 MG tablet Take 1 tablet (40 mg total) by mouth daily.   Facility-Administered Encounter Medications as of 03/22/2020  Medication  . heparin lock flush 100 unit/mL  . sodium chloride flush (NS) 0.9 % injection 10 mL  .  sodium chloride flush (NS) 0.9 % injection 10 mL  . sodium chloride flush (NS) 0.9 % injection 10 mL    Physical Exam: Blood pressure 120/70, pulse 85, temperature (!) 97.1 F (36.2 C), temperature source Oral, height 5\' 3"  (1.6 m), weight 165 lb (74.8 kg), SpO2 91 %. Gen:      No acute distress HEENT:  EOMI, sclera anicteric Neck:     No masses; no thyromegaly Lungs:    Clear to auscultation bilaterally; normal respiratory  effort CV:         Regular rate and rhythm; no murmurs Abd:      + bowel sounds; soft, non-tender; no palpable masses, no distension Ext:    No edema; adequate peripheral perfusion Skin:      Warm and dry; no rash Neuro: alert and oriented x 3 Psych: normal mood and affect  Data Reviewed: Imaging:  CT Angio Chest 02/27/2019: No PE, bilateral  scattered  ground glass opacity  Chest 2 view 03/22/2019: Improvement compared to prior imaging, poor expansion , opacity in lingula  HRCT 08/05/2019-improving bilateral groundglass opacities, mild subpleural fibrosis in the anterior upper lobe. HRCT 01/27/2020- new left lower lobe consolidation with groundglass opacities in the left lower lobe. Improvement in opacities in the left upper lobe. I have reviewed the images personally.  PFTs:  09/01/2019 FVC 2.70 [79%], FEV1 2.37 [89%], F/F 88, TLC 4.08 [80%], DLCO 13.06 [63%] Minimal reversible obstruction, moderate diffusion defect  6-minute walk test 09/20/2019- 19 m, nadir O2 sat of 98%  Labs: CBC 04/08/2019-WBC 4.7, eos 10%, absolute eosinophil count 470  Assessment:  Post Covid pneumonia Has post covid pneumonia complicated by post viral bacteral pneumonia needing multiple rounds of antibiotics. Patient is on rituximab for neuromyelitis optica which makes her more susceptible to infection.  Question of cryptogenic organizing pneumonia given fleeting infiltrates She is currently on prednisone at 20 mg with no significant improvement in symptoms Intermittent fevers consistent with ongoing inflammation  Start Bactrim double strength 3 times a week as she may need to be on prednisone for longer CT scan to reevaluate lung infiltrates and decide on prednisone dose She may need bronchoscopy after review of CT scan to rule out infection if we are to increase the prednisone dose or consider alternate immunosuppressive agents. Continue breo Tessalon for cough   Plan/Recommendations: Continue  prednisone, start Bactrim CT chest Tessalon  Lark Runk MD [facility] Please see [email or web address] for pager details.  03/22/2020, 9:01 AM   CC: Sharilyn Sites, MD

## 2020-03-22 NOTE — Patient Instructions (Signed)
Continue supplemental oxygen Continue prednisone at 20 mg a day Will recheck CT chest high-resolution to reevaluate the lungs We will renew the [facility] Bactrim double strength 3 times a week for pneumonia prophylaxis  Based on the result of the CT scan we may decide on adjusting the dose of prednisone or you may need a procedure called a bronchoscope to get samples from the lungs for culture.  Follow-up in 1 to 2 months.

## 2020-03-22 NOTE — Addendum Note (Signed)
Addended by: Coralie Keens on: 03/22/2020 09:26 AM   Modules accepted: Orders

## 2020-03-23 ENCOUNTER — Other Ambulatory Visit: Payer: Self-pay | Admitting: Pulmonary Disease

## 2020-03-23 DIAGNOSIS — J84116 Cryptogenic organizing pneumonia: Secondary | ICD-10-CM

## 2020-03-23 NOTE — Telephone Encounter (Signed)
   Refill submitted  Plan/Recommendations: Continue prednisone, start Bactrim CT chest Tessalon  Marshell Garfinkel MD  Pulmonary and Critical Care Please see [email or web address] for pager details.  03/22/2020, 9:01 AM

## 2020-03-28 ENCOUNTER — Telehealth: Payer: Self-pay | Admitting: Pulmonary Disease

## 2020-03-28 ENCOUNTER — Other Ambulatory Visit: Payer: Self-pay | Admitting: *Deleted

## 2020-03-28 DIAGNOSIS — J84116 Cryptogenic organizing pneumonia: Secondary | ICD-10-CM

## 2020-03-28 DIAGNOSIS — J1282 Pneumonia due to coronavirus disease 2019: Secondary | ICD-10-CM

## 2020-03-28 DIAGNOSIS — U071 COVID-19: Secondary | ICD-10-CM

## 2020-03-28 DIAGNOSIS — R918 Other nonspecific abnormal finding of lung field: Secondary | ICD-10-CM

## 2020-03-28 NOTE — Telephone Encounter (Signed)
Called and spoke with pt and she stated that her [facility] is getting worse.  She is currently on 3 liters of oxygen and she is not able to walk more than a few steps before she has to stop and rest.  She is very fatigued and this is worse this week.  She is not scheduled for the CT scan until the end of Feb and she feels that this may be too long to wait.  PM please advise.    Pt stated that the CT is scheduled at [facility].

## 2020-03-29 NOTE — Telephone Encounter (Signed)
Spoke with patient. She verbalized understanding. She does have enough prednisone to start the 40mg  daily. She wants to know how long should she stay at 40mg  daily. She is also ok with having the CT done earlier and possibly in [place]. Will send this message to the PCCs to see if they can get this scheduled sooner.   Dr. Vaughan Browner, can you advise on the prednisone?   PCCs, can you all see if this CT can be scheduled sooner? Thanks!

## 2020-03-29 NOTE — Telephone Encounter (Signed)
Increase prednisone to 40mg /day.  Can we see if the CT can be done sooner. May have to be done at [facility] for an earlier date.

## 2020-04-02 NOTE — Telephone Encounter (Signed)
Called spoke with patient. Let her know Dr. Matilde Bash recommendations.  She said she should have enough prednisone to get her through. She has a call to [place] to see about the CT.  [facility] can you see if the CT can be scheduled sooner, this week per Dr. Vaughan Browner.

## 2020-04-02 NOTE — Telephone Encounter (Signed)
Continue prednisone at 40mg /day till CT is done and reviewed. Call in more prednisone if needed  Please check with PCCs if the CT is being rescheduled to earlier date

## 2020-04-02 NOTE — Telephone Encounter (Signed)
Pt calling back because she spoke with [facility] and they said they could get her in for her ct. The order would need to be faxed number to [phone]. Pt can be reached at [phone].

## 2020-04-02 NOTE — Telephone Encounter (Signed)
Spoke with the pt to confirm msg below. I have faxed her CT order to [facility] at the number that she provided. Nothing further needed at this time.

## 2020-04-06 DIAGNOSIS — U071 COVID-19: Secondary | ICD-10-CM | POA: Diagnosis not present

## 2020-04-06 DIAGNOSIS — J1282 Pneumonia due to coronavirus disease 2019: Secondary | ICD-10-CM | POA: Diagnosis not present

## 2020-04-06 DIAGNOSIS — R918 Other nonspecific abnormal finding of lung field: Secondary | ICD-10-CM | POA: Diagnosis not present

## 2020-04-08 ENCOUNTER — Inpatient Hospital Stay (HOSPITAL_COMMUNITY)
Admission: EM | Admit: 2020-04-08 | Discharge: 2020-04-11 | DRG: 177 | Disposition: A | Discharge status: Home / Self Care | Payer: BC Managed Care – PPO | Attending: Internal Medicine | Admitting: Internal Medicine

## 2020-04-08 ENCOUNTER — Other Ambulatory Visit: Payer: Self-pay

## 2020-04-08 ENCOUNTER — Emergency Department (HOSPITAL_COMMUNITY): Payer: BC Managed Care – PPO

## 2020-04-08 ENCOUNTER — Encounter (HOSPITAL_COMMUNITY): Payer: Self-pay | Admitting: Emergency Medicine

## 2020-04-08 DIAGNOSIS — Z7952 Long term (current) use of systemic steroids: Secondary | ICD-10-CM | POA: Diagnosis not present

## 2020-04-08 DIAGNOSIS — K219 Gastro-esophageal reflux disease without esophagitis: Secondary | ICD-10-CM | POA: Diagnosis present

## 2020-04-08 DIAGNOSIS — R918 Other nonspecific abnormal finding of lung field: Secondary | ICD-10-CM

## 2020-04-08 DIAGNOSIS — R0689 Other abnormalities of breathing: Secondary | ICD-10-CM | POA: Diagnosis not present

## 2020-04-08 DIAGNOSIS — K449 Diaphragmatic hernia without obstruction or gangrene: Secondary | ICD-10-CM | POA: Diagnosis not present

## 2020-04-08 DIAGNOSIS — Z853 Personal history of malignant neoplasm of breast: Secondary | ICD-10-CM

## 2020-04-08 DIAGNOSIS — R232 Flushing: Secondary | ICD-10-CM | POA: Diagnosis present

## 2020-04-08 DIAGNOSIS — Z86718 Personal history of other venous thrombosis and embolism: Secondary | ICD-10-CM

## 2020-04-08 DIAGNOSIS — Z87891 Personal history of nicotine dependence: Secondary | ICD-10-CM

## 2020-04-08 DIAGNOSIS — G360 Neuromyelitis optica [Devic]: Secondary | ICD-10-CM | POA: Diagnosis present

## 2020-04-08 DIAGNOSIS — F419 Anxiety disorder, unspecified: Secondary | ICD-10-CM | POA: Diagnosis present

## 2020-04-08 DIAGNOSIS — Z7951 Long term (current) use of inhaled steroids: Secondary | ICD-10-CM | POA: Diagnosis not present

## 2020-04-08 DIAGNOSIS — R069 Unspecified abnormalities of breathing: Secondary | ICD-10-CM | POA: Diagnosis not present

## 2020-04-08 DIAGNOSIS — R0602 Shortness of breath: Secondary | ICD-10-CM

## 2020-04-08 DIAGNOSIS — Z825 Family history of asthma and other chronic lower respiratory diseases: Secondary | ICD-10-CM | POA: Diagnosis not present

## 2020-04-08 DIAGNOSIS — J9601 Acute respiratory failure with hypoxia: Secondary | ICD-10-CM

## 2020-04-08 DIAGNOSIS — U071 COVID-19: Secondary | ICD-10-CM

## 2020-04-08 DIAGNOSIS — Z800 Family history of malignant neoplasm of digestive organs: Secondary | ICD-10-CM | POA: Diagnosis not present

## 2020-04-08 DIAGNOSIS — Z886 Allergy status to analgesic agent status: Secondary | ICD-10-CM

## 2020-04-08 DIAGNOSIS — E872 Acidosis, unspecified: Secondary | ICD-10-CM | POA: Diagnosis present

## 2020-04-08 DIAGNOSIS — Z8249 Family history of ischemic heart disease and other diseases of the circulatory system: Secondary | ICD-10-CM

## 2020-04-08 DIAGNOSIS — E669 Obesity, unspecified: Secondary | ICD-10-CM | POA: Diagnosis present

## 2020-04-08 DIAGNOSIS — J1282 Pneumonia due to coronavirus disease 2019: Secondary | ICD-10-CM | POA: Diagnosis not present

## 2020-04-08 DIAGNOSIS — Z801 Family history of malignant neoplasm of trachea, bronchus and lung: Secondary | ICD-10-CM

## 2020-04-08 DIAGNOSIS — Z7982 Long term (current) use of aspirin: Secondary | ICD-10-CM | POA: Diagnosis not present

## 2020-04-08 DIAGNOSIS — I2699 Other pulmonary embolism without acute cor pulmonale: Secondary | ICD-10-CM | POA: Diagnosis not present

## 2020-04-08 DIAGNOSIS — Z9221 Personal history of antineoplastic chemotherapy: Secondary | ICD-10-CM

## 2020-04-08 DIAGNOSIS — J9621 Acute and chronic respiratory failure with hypoxia: Secondary | ICD-10-CM | POA: Diagnosis not present

## 2020-04-08 DIAGNOSIS — R0902 Hypoxemia: Secondary | ICD-10-CM | POA: Diagnosis not present

## 2020-04-08 DIAGNOSIS — Z803 Family history of malignant neoplasm of breast: Secondary | ICD-10-CM

## 2020-04-08 DIAGNOSIS — Z9981 Dependence on supplemental oxygen: Secondary | ICD-10-CM | POA: Diagnosis not present

## 2020-04-08 DIAGNOSIS — I959 Hypotension, unspecified: Secondary | ICD-10-CM | POA: Diagnosis present

## 2020-04-08 DIAGNOSIS — D84821 Immunodeficiency due to drugs: Secondary | ICD-10-CM | POA: Diagnosis not present

## 2020-04-08 DIAGNOSIS — Z923 Personal history of irradiation: Secondary | ICD-10-CM

## 2020-04-08 DIAGNOSIS — Z6826 Body mass index (BMI) 26.0-26.9, adult: Secondary | ICD-10-CM

## 2020-04-08 DIAGNOSIS — J189 Pneumonia, unspecified organism: Secondary | ICD-10-CM | POA: Diagnosis not present

## 2020-04-08 LAB — COMPREHENSIVE METABOLIC PANEL
ALT: 70 U/L — ABNORMAL HIGH (ref 0–44)
AST: 47 U/L — ABNORMAL HIGH (ref 15–41)
Albumin: 3.9 g/dL (ref 3.5–5.0)
Alkaline Phosphatase: 298 U/L — ABNORMAL HIGH (ref 38–126)
Anion gap: 13 (ref 5–15)
BUN: 18 mg/dL (ref 6–20)
CO2: 25 mmol/L (ref 22–32)
Calcium: 9.9 mg/dL (ref 8.9–10.3)
Chloride: 94 mmol/L — ABNORMAL LOW (ref 98–111)
Creatinine, Ser: 0.6 mg/dL (ref 0.44–1.00)
GFR, Estimated: >60 mL/min (ref >60)
Glucose, Bld: 130 mg/dL — ABNORMAL HIGH (ref 70–99)
Potassium: 3.7 mmol/L (ref 3.5–5.1)
Sodium: 132 mmol/L — ABNORMAL LOW (ref 135–145)
Total Bilirubin: 0.7 mg/dL (ref 0.3–1.2)
Total Protein: 8.3 g/dL — ABNORMAL HIGH (ref 6.5–8.1)

## 2020-04-08 LAB — CBC
HCT: 43.5 % (ref 36.0–46.0)
Hemoglobin: 13.4 g/dL (ref 12.0–15.0)
MCH: 27.3 pg (ref 26.0–34.0)
MCHC: 30.8 g/dL (ref 30.0–36.0)
MCV: 88.6 fL (ref 80.0–100.0)
Platelets: 328 10*3/uL (ref 150–400)
RBC: 4.91 MIL/uL (ref 3.87–5.11)
RDW: 17.2 % — ABNORMAL HIGH (ref 11.5–15.5)
WBC: 8.2 10*3/uL (ref 4.0–10.5)
nRBC: 0 % (ref 0.0–0.2)

## 2020-04-08 LAB — LACTIC ACID, PLASMA: Lactic Acid, Venous: 2.5 mmol/L (ref 0.5–1.9)

## 2020-04-08 LAB — TROPONIN I (HIGH SENSITIVITY): Troponin I (High Sensitivity): 5 ng/L (ref <18)

## 2020-04-08 LAB — BRAIN NATRIURETIC PEPTIDE: B Natriuretic Peptide: 47 pg/mL (ref 0.0–100.0)

## 2020-04-08 LAB — D-DIMER, QUANTITATIVE: D-Dimer, Quant: 1.02 ug/mL-FEU — ABNORMAL HIGH (ref 0.00–0.50)

## 2020-04-08 MED ORDER — ALBUTEROL SULFATE HFA 108 (90 BASE) MCG/ACT IN AERS
4.0000 | INHALATION_SPRAY | Freq: Once | RESPIRATORY_TRACT | Status: AC
  Administered 2020-04-09: 4 via RESPIRATORY_TRACT

## 2020-04-08 MED ORDER — SODIUM CHLORIDE 0.9 % IV SOLN
500.0000 mg | Freq: Once | INTRAVENOUS | Status: AC
  Administered 2020-04-09: 500 mg via INTRAVENOUS
  Filled 2020-04-08: qty 500

## 2020-04-08 MED ORDER — ALBUTEROL SULFATE HFA 108 (90 BASE) MCG/ACT IN AERS
4.0000 | INHALATION_SPRAY | Freq: Once | RESPIRATORY_TRACT | Status: AC
  Administered 2020-04-08: 4 via RESPIRATORY_TRACT
  Filled 2020-04-08: qty 6.7

## 2020-04-08 MED ORDER — ALBUTEROL SULFATE HFA 108 (90 BASE) MCG/ACT IN AERS
2.0000 | INHALATION_SPRAY | RESPIRATORY_TRACT | Status: DC | PRN
  Filled 2020-04-08: qty 6.7

## 2020-04-08 MED ORDER — SODIUM CHLORIDE 0.9 % IV SOLN
1.0000 g | Freq: Once | INTRAVENOUS | Status: AC
  Administered 2020-04-09: 1 g via INTRAVENOUS
  Filled 2020-04-08: qty 10

## 2020-04-08 MED ORDER — IPRATROPIUM BROMIDE HFA 17 MCG/ACT IN AERS
2.0000 | INHALATION_SPRAY | Freq: Once | RESPIRATORY_TRACT | Status: AC
  Administered 2020-04-09: 2 via RESPIRATORY_TRACT
  Filled 2020-04-08 (×2): qty 12.9

## 2020-04-08 MED ORDER — IOHEXOL 350 MG/ML SOLN
100.0000 mL | Freq: Once | INTRAVENOUS | Status: AC | PRN
  Administered 2020-04-08: 100 mL via INTRAVENOUS

## 2020-04-08 MED ORDER — HYDROCORTISONE NA SUCCINATE PF 100 MG IJ SOLR
100.0000 mg | Freq: Once | INTRAMUSCULAR | Status: AC
  Administered 2020-04-08: 100 mg via INTRAVENOUS
  Filled 2020-04-08: qty 2

## 2020-04-08 MED ORDER — SODIUM CHLORIDE 0.9 % IV BOLUS
1000.0000 mL | Freq: Once | INTRAVENOUS | Status: AC
  Administered 2020-04-09: 1000 mL via INTRAVENOUS

## 2020-04-08 NOTE — ED Notes (Signed)
Nasal canula placed in mouth ; pt is a mouth breather. 86% with nasal canula in nose. 91% with it in her mouth

## 2020-04-08 NOTE — ED Notes (Signed)
Lactic acid 2.5 reported to edp steinl at this time.

## 2020-04-08 NOTE — ED Provider Notes (Addendum)
[facility] EMERGENCY DEPARTMENT Provider Note   CSN: [id number] Arrival date & time: 04/08/20  2101     History Chief Complaint  Patient presents with  . Shortness of Breath    Grace Moyer is a 60 y.o. female.  Patient w hx breast cancer, presents with fever, non prod cough, and increased sob in the past 2-3 days. Symptoms recurrent, persistent, mod-severe, worse in past couple days. States had covid in 2020, and indicates had done well up until fall of this past year when was diagnosed w pna. States has been on courses of antibiotics, and on prolonged steroid tx, but symptoms recurrent with sob, cough, and low grade fever. Recent pulmonary notes for same reference possible organizing cryptogenic pna. 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 States recently has been on 1-2 liters 02 at rest and 3-4 liters w any activity. Denies chest pain or discomfort. No leg pain or swelling. Notes hx dvt but was felt to be provoked due to breast ca/surgery, and is not currently on anticoagulant therapy. Did have covid vaccine x 2 but no booster.   The history is provided by the patient.  Shortness of Breath Associated symptoms: cough and fever   Associated symptoms: no abdominal pain, no chest pain, no headaches, no neck pain, no rash, no sore throat and no vomiting        Past Medical History:  Diagnosis Date  . Anxiety   . Breast cancer ([place])   . Breast cancer ([place])   . Family history of breast cancer   . Family history of colon cancer   . Malignant neoplasm of upper-outer quadrant of left female breast ([place]) 01/31/2016  . Neuromyelitis optica ([place])   . Personal history of chemotherapy   . Personal history of radiation therapy     Patient Active Problem List   Diagnosis Date Noted  . Abnormal findings on diagnostic imaging of lung 02/10/2020  . Healthcare maintenance 02/10/2020  . Cryptogenic organizing pneumonia ([place]) 02/10/2020  . Chronic  respiratory failure with hypoxia ([place]) 02/10/2020  . Acute hypoxemic respiratory failure ([place]) 01/27/2020  . Prediabetes   . Transaminitis   . Gastroesophageal reflux disease   . Class 2 obesity due to excess calories with body mass index (BMI) of 36.0 to 36.9 in adult   . History of COVID-19 02/10/2019  . Personal history of venous thrombosis and embolism 12/31/2016  . Peripheral neuropathy due to chemotherapy ([place]) 12/31/2016  . Acute pulmonary embolus ([place]) 08/15/2016  . Anemia due to antineoplastic chemotherapy 04/23/2016  . Genetic testing 04/03/2016  . Port catheter in place 03/26/2016  . Family history of breast cancer   . Family history of colon cancer   . Neuromyelitis optica spectrum disorder ([place]) 02/06/2016  . Malignant neoplasm of upper-outer quadrant of left female breast ([place]) 01/31/2016  . Bilateral leg numbness 08/14/2015  . Neck pain on left side 08/14/2015  . Abnormal MRI of head 08/14/2015    Past Surgical History:  Procedure Laterality Date  . ABDOMINAL HYSTERECTOMY     partial  . BREAST BIOPSY Left 2017  . BREAST LUMPECTOMY Left    2018  . BREAST LUMPECTOMY WITH RADIOACTIVE SEED AND SENTINEL LYMPH NODE BIOPSY Left 07/31/2016   Procedure: LEFT BREAST RADIOACTIVE SEED GUIDED LUMPECTOMY WITH LEFT RADIOACTIVE SEED TARGETED AXILLARY SENTINEL LYMPH NODE EXCISION AND SENTINEL LYMPH NODE BIOPSY;  Surgeon: Rolm Bookbinder, MD;  Location: [place];  Service: General;  Laterality: Left;  . BREAST  REDUCTION SURGERY Bilateral 08/05/2016   Procedure: LEFT ONCOPLASTIC REDUCTION; RIGHT BREAST REDUCTION;  Surgeon: Irene Limbo, MD;  Location: [place];  Service: Plastics;  Laterality: Bilateral;  . EYE SURGERY    . PORT-A-CATH REMOVAL Right 07/31/2016   Procedure: REMOVAL PORT-A-CATH;  Surgeon: Rolm Bookbinder, MD;  Location: [place];  Service: General;  Laterality: Right;  . PORTACATH PLACEMENT Right 02/11/2016    Procedure: INSERTION PORT-A-CATH WITH [place];  Surgeon: Rolm Bookbinder, MD;  Location: [place];  Service: General;  Laterality: Right;  . REDUCTION MAMMAPLASTY       OB History   No obstetric history on file.     Family History  Problem Relation Age of Onset  . Breast cancer Maternal Grandmother        dx in her 42s  . CAD Paternal Grandmother   . Colon cancer Paternal Uncle        dx in his 41s  . COPD Maternal Grandfather   . CAD Paternal Grandfather   . Breast cancer Cousin        dx in her early to mid 57s; maternal first cousin  . Lung cancer Paternal Uncle     Social History   Tobacco Use  . Smoking status: Former Smoker    Types: Cigarettes    Quit date: 09/03/2001    Years since quitting: 18.6  . Smokeless tobacco: Never Used  Substance Use Topics  . Alcohol use: Yes    Comment: social  . Drug use: No    Home Medications Prior to Admission medications   Medication Sig Start Date End Date Taking? Authorizing Provider  albuterol (VENTOLIN HFA) 108 (90 Base) MCG/ACT inhaler Inhale 2 puffs into the lungs every 4 (four) hours as needed for wheezing or shortness of breath. 02/10/20   Lauraine Rinne, NP  aspirin 81 MG tablet Take 1 tablet (81 mg total) by mouth daily. 03/30/17   Magrinat, Virgie Dad, MD  benzonatate (TESSALON) 200 MG capsule Take 1 capsule (200 mg total) by mouth in the morning, at noon, and at bedtime. 03/22/20   Marshell Garfinkel, MD  Cholecalciferol 25 MCG (1000 UT) tablet Take 5,000 Units by mouth daily.     [provider]  fluticasone furoate-vilanterol (BREO ELLIPTA) 200-25 MCG/INH AEPB Inhale 1 puff into the lungs daily. 03/22/20   Mannam, Hart Robinsons, MD  gabapentin (NEURONTIN) 300 MG capsule Take 300 mg by mouth at bedtime.    [provider]  predniSONE (DELTASONE) 10 MG tablet Take 1 tablet (10 mg total) by mouth daily. 03/23/20   Mannam, Hart Robinsons, MD  sulfamethoxazole-trimethoprim (BACTRIM DS) 800-160 MG tablet Take 1  tablet by mouth 3 (three) times a week. 03/23/20   Mannam, Hart Robinsons, MD  vitamin C (VITAMIN C) 500 MG tablet Take 1 tablet (500 mg total) by mouth daily. 02/14/19   Barton Dubois, MD  zinc sulfate 220 (50 Zn) MG capsule Take 1 capsule (220 mg total) by mouth daily. Patient taking differently: Take 50 mg by mouth daily. 02/14/19   Barton Dubois, MD  pantoprazole (PROTONIX) 40 MG tablet Take 1 tablet (40 mg total) by mouth daily. 02/13/19 02/27/19  Barton Dubois, MD    Allergies    Hydrocodone  Review of Systems   Review of Systems  Constitutional: Positive for fever. Negative for chills.  HENT: Negative for sore throat.   Eyes: Negative for redness.  Respiratory: Positive for cough and shortness of breath.   Cardiovascular: Negative for  chest pain and leg swelling.  Gastrointestinal: Negative for abdominal pain and vomiting.  Genitourinary: Negative for flank pain.  Musculoskeletal: Negative for back pain and neck pain.  Skin: Negative for rash.  Neurological: Negative for headaches.  Hematological: Does not bruise/bleed easily.  Psychiatric/Behavioral: Negative for confusion.    Physical Exam Updated Vital Signs BP 118/88   Pulse (!) 107   Temp 99.2 F (37.3 C) (Oral)   Resp (!) 22   Ht 1.6 m (5\' 3" )   Wt 68 kg   SpO2 90%   BMI 26.57 kg/m   Physical Exam Vitals and nursing note reviewed.  Constitutional:      Appearance: Normal appearance. She is well-developed.  HENT:     Head: Atraumatic.     Nose: Nose normal.     Mouth/Throat:     Mouth: Mucous membranes are moist.  Eyes:     General: No scleral icterus.    Conjunctiva/sclera: Conjunctivae normal.  Neck:     Trachea: No tracheal deviation.  Cardiovascular:     Rate and Rhythm: Regular rhythm. Tachycardia present.     Pulses: Normal pulses.     Heart sounds: Normal heart sounds. No murmur heard. No friction rub. No gallop.   Pulmonary:     Effort: Respiratory distress present.     Breath sounds: Rales  present.  Abdominal:     General: Bowel sounds are normal. There is no distension.     Palpations: Abdomen is soft.     Tenderness: There is no abdominal tenderness. There is no guarding.  Genitourinary:    Comments: No cva tenderness.  Musculoskeletal:        General: No swelling or tenderness.     Cervical back: Normal range of motion and neck supple. No rigidity. No muscular tenderness.     Right lower leg: No edema.     Left lower leg: No edema.  Skin:    General: Skin is warm and dry.     Findings: No rash.  Neurological:     Mental Status: She is alert.     Comments: Alert, speech normal.   Psychiatric:        Mood and Affect: Mood normal.     ED Results / Procedures / Treatments   Labs (all labs ordered are listed, but only abnormal results are displayed) Results for orders placed or performed during the hospital encounter of 04/08/20  SARS Coronavirus 2 by RT PCR (hospital order, performed in [facility] hospital lab) Nasopharyngeal Nasopharyngeal Swab   Specimen: Nasopharyngeal Swab  Result Value Ref Range   SARS Coronavirus 2 POSITIVE (A) NEGATIVE  Blood culture (routine x 2)   Specimen: BLOOD  Result Value Ref Range   Specimen Description BLOOD BLOOD RIGHT HAND    Special Requests      BOTTLES DRAWN AEROBIC AND ANAEROBIC Blood Culture results may not be optimal due to an inadequate volume of blood received in culture bottles   Culture      NO GROWTH < 12 HOURS Performed at [facility], [address], [place], [place] [address]    Report Status PENDING   Blood culture (routine x 2)   Specimen: BLOOD  Result Value Ref Range   Specimen Description BLOOD BLOOD RIGHT FOREARM    Special Requests      BOTTLES DRAWN AEROBIC AND ANAEROBIC Blood Culture results may not be optimal due to an inadequate volume of blood received in culture bottles   Culture  NO GROWTH < 12 HOURS Performed at [facility], [address], [place], [place] [address]    Report Status  PENDING   CBC  Result Value Ref Range   WBC 8.2 4.0 - 10.5 K/uL   RBC 4.91 3.87 - 5.11 MIL/uL   Hemoglobin 13.4 12.0 - 15.0 g/dL   HCT 43.5 36.0 - 46.0 %   MCV 88.6 80.0 - 100.0 fL   MCH 27.3 26.0 - 34.0 pg   MCHC 30.8 30.0 - 36.0 g/dL   RDW 17.2 (H) 11.5 - 15.5 %   Platelets 328 150 - 400 K/uL   nRBC 0.0 0.0 - 0.2 %  Comprehensive metabolic panel  Result Value Ref Range   Sodium 132 (L) 135 - 145 mmol/L   Potassium 3.7 3.5 - 5.1 mmol/L   Chloride 94 (L) 98 - 111 mmol/L   CO2 25 22 - 32 mmol/L   Glucose, Bld 130 (H) 70 - 99 mg/dL   BUN 18 6 - 20 mg/dL   Creatinine, Ser 0.60 0.44 - 1.00 mg/dL   Calcium 9.9 8.9 - 10.3 mg/dL   Total Protein 8.3 (H) 6.5 - 8.1 g/dL   Albumin 3.9 3.5 - 5.0 g/dL   AST 47 (H) 15 - 41 U/L   ALT 70 (H) 0 - 44 U/L   Alkaline Phosphatase 298 (H) 38 - 126 U/L   Total Bilirubin 0.7 0.3 - 1.2 mg/dL   GFR, Estimated >60 >60 mL/min   Anion gap 13 5 - 15  Brain natriuretic peptide  Result Value Ref Range   B Natriuretic Peptide 47.0 0.0 - 100.0 pg/mL  Lactic acid, plasma  Result Value Ref Range   Lactic Acid, Venous 2.5 (HH) 0.5 - 1.9 mmol/L  D-dimer, quantitative (not at [facility])  Result Value Ref Range   D-Dimer, Quant 1.02 (H) 0.00 - 0.50 ug/mL-FEU  Blood gas, venous  Result Value Ref Range   FIO2 21.00    pH, Ven 7.396 7.250 - 7.430   pCO2, Ven 42.7 (L) 44.0 - 60.0 mmHg   Bicarbonate 25.6 20.0 - 28.0 mmol/L   Acid-Base Excess 1.3 0.0 - 2.0 mmol/L   O2 Saturation 73.9 %   Patient temperature 37.0    Collection site ARM    Sample type VENOUS   HIV Antibody (routine testing w rflx)  Result Value Ref Range   HIV Screen 4th Generation wRfx Non Reactive Non Reactive  Comprehensive metabolic panel  Result Value Ref Range   Sodium 138 135 - 145 mmol/L   Potassium 3.7 3.5 - 5.1 mmol/L   Chloride 102 98 - 111 mmol/L   CO2 24 22 - 32 mmol/L   Glucose, Bld 147 (H) 70 - 99 mg/dL   BUN 13 6 - 20 mg/dL   Creatinine, Ser 0.62 0.44 - 1.00 mg/dL   Calcium  8.6 (L) 8.9 - 10.3 mg/dL   Total Protein 5.7 (L) 6.5 - 8.1 g/dL   Albumin 2.7 (L) 3.5 - 5.0 g/dL   AST 24 15 - 41 U/L   ALT 46 (H) 0 - 44 U/L   Alkaline Phosphatase 216 (H) 38 - 126 U/L   Total Bilirubin 0.6 0.3 - 1.2 mg/dL   GFR, Estimated >60 >60 mL/min   Anion gap 12 5 - 15  CBC  Result Value Ref Range   WBC 6.8 4.0 - 10.5 K/uL   RBC 3.79 (L) 3.87 - 5.11 MIL/uL   Hemoglobin 10.7 (L) 12.0 - 15.0 g/dL   HCT  33.0 (L) 36.0 - 46.0 %   MCV 87.1 80.0 - 100.0 fL   MCH 28.2 26.0 - 34.0 pg   MCHC 32.4 30.0 - 36.0 g/dL   RDW 17.0 (H) 11.5 - 15.5 %   Platelets 255 150 - 400 K/uL   nRBC 0.0 0.0 - 0.2 %  Lactic acid, plasma  Result Value Ref Range   Lactic Acid, Venous 1.1 0.5 - 1.9 mmol/L  Procalcitonin - Baseline  Result Value Ref Range   Procalcitonin <0.10 ng/mL  C-reactive protein  Result Value Ref Range   CRP 13.3 (H) <1.0 mg/dL  Ferritin  Result Value Ref Range   Ferritin 875 (H) 11 - 307 ng/mL  Fibrinogen  Result Value Ref Range   Fibrinogen >800 (H) 210 - 475 mg/dL  Lactate dehydrogenase  Result Value Ref Range   LDH 254 (H) 98 - 192 U/L  Troponin I (High Sensitivity)  Result Value Ref Range   Troponin I (High Sensitivity) 5 <18 ng/L  Troponin I (High Sensitivity)  Result Value Ref Range   Troponin I (High Sensitivity) 6 <18 ng/L    EKG EKG Interpretation  Date/Time:  Sunday April 08 2020 21:12:19 EST Ventricular Rate:  113 PR Interval:    QRS Duration: 73 QT Interval:  304 QTC Calculation: 417 R Axis:   32 Text Interpretation: Sinus tachycardia Nonspecific T wave abnormality Confirmed by Lajean Saver [phone]) on 04/08/2020 9:30:06 PM   Radiology DG Chest 2 View  Result Date: 04/08/2020 CLINICAL DATA:  Shortness of breath EXAM: CHEST - 2 VIEW COMPARISON:  February 10, 2020 FINDINGS: The heart size and mediastinal contours are within normal limits. Overall shallow degree of aeration with patchy airspace opacities seen throughout both lower lungs. There  appears to be slight interval worsening since the prior exam. No pleural effusion. No acute osseous abnormality. IMPRESSION: Slight interval worsening in the patchy airspace opacities in both lower lungs which could be due to multifocal pneumonia. Electronically Signed   By: Prudencio Pair M.D.   On: 04/08/2020 22:05   CT Angio Chest PE W/Cm &/Or Wo Cm  Result Date: 04/08/2020 CLINICAL DATA:  Increased shortness of breath with exertion. Recently diagnosed with pneumonia. EXAM: CT ANGIOGRAPHY CHEST WITH CONTRAST TECHNIQUE: Multidetector CT imaging of the chest was performed using the standard protocol during bolus administration of intravenous contrast. Multiplanar CT image reconstructions and MIPs were obtained to evaluate the vascular anatomy. CONTRAST:  184mL OMNIPAQUE IOHEXOL 350 MG/ML SOLN COMPARISON:  Chest radiograph April 08, 2020. FINDINGS: Cardiovascular: Satisfactory opacification of the pulmonary arteries to the segmental level. No evidence of pulmonary embolism. Normal heart size. No pericardial effusion. Mediastinum/Nodes: No enlarged mediastinal, hilar, or axillary lymph nodes. Thyroid gland and trachea demonstrate no significant findings. Small hiatal hernia. Lungs/Pleura: Extensive multifocal consolidations and ground-glass opacities most prominent in the lower lobes. Upper Abdomen: No acute abnormality. Musculoskeletal: Mild thoracic spondylosis. Review of the MIP images confirms the above findings. IMPRESSION: 1. Negative examination for pulmonary embolism. 2. Extensive multifocal consolidations and ground-glass opacities most prominent in the lower lobes, concerning for multifocal pneumonia. However, interstitial lung disease could appear and given patient's history of possible post COVID interstitial lung disease recommend pulmonary consult if not previously performed. Electronically Signed   By: Dahlia Bailiff MD   On: 04/08/2020 23:33    Procedures Procedures   Medications Ordered in  ED Medications  albuterol (VENTOLIN HFA) 108 (90 Base) MCG/ACT inhaler 2 puff (has no administration in time range)  albuterol (VENTOLIN  HFA) 108 (90 Base) MCG/ACT inhaler 4 puff (has no administration in time range)  hydrocortisone sodium succinate (SOLU-CORTEF) 100 MG injection 100 mg (has no administration in time range)    ED Course  I have reviewed the triage vital signs and the nursing notes.  Pertinent labs & imaging results that were available during my care of the patient were reviewed by me and considered in my medical decision making (see chart for details).    MDM Rules/Calculators/A&P                         Iv ns. Continuous pulse ox and cardiac monitoring. o2 3 liters  - sats 86%, on 5 liters sats 92%.   Pt with chronic steroid use, current stressor/acute illness. solucortef iv.   Labs sent including covid swab.   Reviewed nursing notes and prior charts for additional history.  Recent outpatient ct without contrast - bilateral infiltrates - ?covid vs viral, vs bacterial.  Recheck pt, wheezing. Albuterol/atrovent tx.   CXR reviewed/interpreted by me - bil infiltrates, ?covid vs other.  Cultures sent. Iv antibiotics given.   Lactate is high - note, no hypotension, no aki, ?recent poor po intake, ?volume depletion. Iv ns bolus.   Labs reviewed/interpreeted by me - wbc normal. ddimer elevated. Will get cta r/o PE.    Covid test remains pending. CT chest r/o PE  Remains pending.   CTA reviewed/interpreted by me - no PE. +bil infiltrates. covid remains pending.   Hospitalists consulted for admission. Discussed pt, current and prior hx. Will admit.   Subsequent to discussion with hospitalist, covid test results positive. Additional inflammatory markers added to workup.  Imaging studies also c/w covid.    Ironesha Rothe was evaluated in Emergency Department on 04/09/2020 for the symptoms described in the history of present illness. She was evaluated in the context of  the global COVID-19 pandemic, which necessitated consideration that the patient might be at risk for infection with the SARS-CoV-2 virus that causes COVID-19. Institutional protocols and algorithms that pertain to the evaluation of patients at risk for COVID-19 are in a state of rapid change based on information released by regulatory bodies including the CDC and federal and state organizations. These policies and algorithms were followed during the patient's care in the ED.  Pt with acute on chronic resp failure. o2 currently at 7-8 liters.   CRITICAL CARE RE: acute on chronic respiratory failure w hypoxia, covid infection/pna, lactic acidosis.  Performed by: Mirna Mires Total critical care time: 85 minutes Critical care time was exclusive of separately billable procedures and treating other patients. Critical care was necessary to treat or prevent imminent or life-threatening deterioration. Critical care was time spent personally by me on the following activities: development of treatment plan with patient and/or surrogate as well as nursing, discussions with consultants, evaluation of patient's response to treatment, examination of patient, obtaining history from patient or surrogate, ordering and performing treatments and interventions, ordering and review of laboratory studies, ordering and review of radiographic studies, pulse oximetry and re-evaluation of patient's condition.          Final Clinical Impression(s) / ED Diagnoses Final diagnoses:  None    Rx / DC Orders ED Discharge Orders    None          Lajean Saver, MD 04/09/20 1246

## 2020-04-08 NOTE — ED Triage Notes (Signed)
Pt with increased SOB with exertion. Pt recently diagnosed with PNA on Friday @ [place]. Pt on home O2 at 3-4L and when EMS arrived; sats were reading 88%.

## 2020-04-08 NOTE — Progress Notes (Signed)
Listened to patient's BS; patient is clear and diminished, no wheezing detected.  Put patient on [place] HFNC and patient is currently on 8L.  Patient is mouth breathing to took [place] out of nose and put patient's mask on her.  Patient sat is fluctuating between 88% to upper 90s.  Currently she is at 98% at this time.  Will continue to monitor.

## 2020-04-09 DIAGNOSIS — U071 COVID-19: Secondary | ICD-10-CM | POA: Diagnosis present

## 2020-04-09 DIAGNOSIS — F419 Anxiety disorder, unspecified: Secondary | ICD-10-CM | POA: Diagnosis present

## 2020-04-09 DIAGNOSIS — J9621 Acute and chronic respiratory failure with hypoxia: Secondary | ICD-10-CM | POA: Diagnosis present

## 2020-04-09 DIAGNOSIS — K219 Gastro-esophageal reflux disease without esophagitis: Secondary | ICD-10-CM | POA: Diagnosis present

## 2020-04-09 DIAGNOSIS — R232 Flushing: Secondary | ICD-10-CM | POA: Diagnosis present

## 2020-04-09 DIAGNOSIS — Z853 Personal history of malignant neoplasm of breast: Secondary | ICD-10-CM | POA: Diagnosis not present

## 2020-04-09 DIAGNOSIS — J9601 Acute respiratory failure with hypoxia: Secondary | ICD-10-CM | POA: Diagnosis present

## 2020-04-09 DIAGNOSIS — D84821 Immunodeficiency due to drugs: Secondary | ICD-10-CM | POA: Diagnosis not present

## 2020-04-09 DIAGNOSIS — Z803 Family history of malignant neoplasm of breast: Secondary | ICD-10-CM | POA: Diagnosis not present

## 2020-04-09 DIAGNOSIS — G360 Neuromyelitis optica [Devic]: Secondary | ICD-10-CM | POA: Diagnosis present

## 2020-04-09 DIAGNOSIS — Z9221 Personal history of antineoplastic chemotherapy: Secondary | ICD-10-CM | POA: Diagnosis not present

## 2020-04-09 DIAGNOSIS — Z7952 Long term (current) use of systemic steroids: Secondary | ICD-10-CM | POA: Diagnosis not present

## 2020-04-09 DIAGNOSIS — Z8249 Family history of ischemic heart disease and other diseases of the circulatory system: Secondary | ICD-10-CM | POA: Diagnosis not present

## 2020-04-09 DIAGNOSIS — E669 Obesity, unspecified: Secondary | ICD-10-CM | POA: Diagnosis present

## 2020-04-09 DIAGNOSIS — Z9981 Dependence on supplemental oxygen: Secondary | ICD-10-CM | POA: Diagnosis not present

## 2020-04-09 DIAGNOSIS — J1282 Pneumonia due to coronavirus disease 2019: Secondary | ICD-10-CM

## 2020-04-09 DIAGNOSIS — E872 Acidosis, unspecified: Secondary | ICD-10-CM | POA: Diagnosis present

## 2020-04-09 DIAGNOSIS — Z86718 Personal history of other venous thrombosis and embolism: Secondary | ICD-10-CM | POA: Diagnosis not present

## 2020-04-09 DIAGNOSIS — Z87891 Personal history of nicotine dependence: Secondary | ICD-10-CM | POA: Diagnosis not present

## 2020-04-09 DIAGNOSIS — Z800 Family history of malignant neoplasm of digestive organs: Secondary | ICD-10-CM | POA: Diagnosis not present

## 2020-04-09 DIAGNOSIS — Z923 Personal history of irradiation: Secondary | ICD-10-CM | POA: Diagnosis not present

## 2020-04-09 DIAGNOSIS — Z7982 Long term (current) use of aspirin: Secondary | ICD-10-CM | POA: Diagnosis not present

## 2020-04-09 DIAGNOSIS — Z825 Family history of asthma and other chronic lower respiratory diseases: Secondary | ICD-10-CM | POA: Diagnosis not present

## 2020-04-09 DIAGNOSIS — Z7951 Long term (current) use of inhaled steroids: Secondary | ICD-10-CM | POA: Diagnosis not present

## 2020-04-09 DIAGNOSIS — Z801 Family history of malignant neoplasm of trachea, bronchus and lung: Secondary | ICD-10-CM | POA: Diagnosis not present

## 2020-04-09 DIAGNOSIS — Z886 Allergy status to analgesic agent status: Secondary | ICD-10-CM | POA: Diagnosis not present

## 2020-04-09 LAB — BLOOD GAS, VENOUS
Acid-Base Excess: 1.3 mmol/L (ref 0.0–2.0)
Bicarbonate: 25.6 mmol/L (ref 20.0–28.0)
FIO2: 21
O2 Saturation: 73.9 %
Patient temperature: 37
pCO2, Ven: 42.7 mmHg — ABNORMAL LOW (ref 44.0–60.0)
pH, Ven: 7.396 (ref 7.250–7.430)

## 2020-04-09 LAB — CBC
HCT: 33 % — ABNORMAL LOW (ref 36.0–46.0)
Hemoglobin: 10.7 g/dL — ABNORMAL LOW (ref 12.0–15.0)
MCH: 28.2 pg (ref 26.0–34.0)
MCHC: 32.4 g/dL (ref 30.0–36.0)
MCV: 87.1 fL (ref 80.0–100.0)
Platelets: 255 10*3/uL (ref 150–400)
RBC: 3.79 MIL/uL — ABNORMAL LOW (ref 3.87–5.11)
RDW: 17 % — ABNORMAL HIGH (ref 11.5–15.5)
WBC: 6.8 10*3/uL (ref 4.0–10.5)
nRBC: 0 % (ref 0.0–0.2)

## 2020-04-09 LAB — COMPREHENSIVE METABOLIC PANEL
ALT: 46 U/L — ABNORMAL HIGH (ref 0–44)
AST: 24 U/L (ref 15–41)
Albumin: 2.7 g/dL — ABNORMAL LOW (ref 3.5–5.0)
Alkaline Phosphatase: 216 U/L — ABNORMAL HIGH (ref 38–126)
Anion gap: 12 (ref 5–15)
BUN: 13 mg/dL (ref 6–20)
CO2: 24 mmol/L (ref 22–32)
Calcium: 8.6 mg/dL — ABNORMAL LOW (ref 8.9–10.3)
Chloride: 102 mmol/L (ref 98–111)
Creatinine, Ser: 0.62 mg/dL (ref 0.44–1.00)
GFR, Estimated: >60 mL/min (ref >60)
Glucose, Bld: 147 mg/dL — ABNORMAL HIGH (ref 70–99)
Potassium: 3.7 mmol/L (ref 3.5–5.1)
Sodium: 138 mmol/L (ref 135–145)
Total Bilirubin: 0.6 mg/dL (ref 0.3–1.2)
Total Protein: 5.7 g/dL — ABNORMAL LOW (ref 6.5–8.1)

## 2020-04-09 LAB — HIV ANTIBODY (ROUTINE TESTING W REFLEX): HIV Screen 4th Generation wRfx: NONREACTIVE

## 2020-04-09 LAB — FIBRINOGEN: Fibrinogen: >800 mg/dL — ABNORMAL HIGH (ref 210–475)

## 2020-04-09 LAB — FERRITIN: Ferritin: 875 ng/mL — ABNORMAL HIGH (ref 11–307)

## 2020-04-09 LAB — SARS CORONAVIRUS 2 BY RT PCR (HOSPITAL ORDER, PERFORMED IN ~~LOC~~ HOSPITAL LAB): SARS Coronavirus 2: POSITIVE — AB

## 2020-04-09 LAB — LACTATE DEHYDROGENASE: LDH: 254 U/L — ABNORMAL HIGH (ref 98–192)

## 2020-04-09 LAB — TROPONIN I (HIGH SENSITIVITY): Troponin I (High Sensitivity): 6 ng/L (ref <18)

## 2020-04-09 LAB — PROCALCITONIN: Procalcitonin: <0.1 ng/mL

## 2020-04-09 LAB — C-REACTIVE PROTEIN: CRP: 13.3 mg/dL — ABNORMAL HIGH (ref <1.0)

## 2020-04-09 LAB — LACTIC ACID, PLASMA: Lactic Acid, Venous: 1.1 mmol/L (ref 0.5–1.9)

## 2020-04-09 MED ORDER — ALBUTEROL SULFATE HFA 108 (90 BASE) MCG/ACT IN AERS: 2.0000 | INHALATION_SPRAY | RESPIRATORY_TRACT | Status: DC | PRN

## 2020-04-09 MED ORDER — ACETAMINOPHEN 325 MG PO TABS: 650.0000 mg | ORAL_TABLET | Freq: Four times a day (QID) | ORAL | Status: DC | PRN

## 2020-04-09 MED ORDER — ASCORBIC ACID 500 MG PO TABS
500.0000 mg | ORAL_TABLET | Freq: Every day | ORAL | Status: DC
  Administered 2020-04-09 – 2020-04-11 (×3): 500 mg via ORAL
  Filled 2020-04-09 (×3): qty 1

## 2020-04-09 MED ORDER — DIPHENHYDRAMINE HCL 25 MG PO CAPS
25.0000 mg | ORAL_CAPSULE | ORAL | Status: DC | PRN
  Administered 2020-04-09: 25 mg via ORAL
  Filled 2020-04-09: qty 1

## 2020-04-09 MED ORDER — ASPIRIN 81 MG PO CHEW: 81.0000 mg | CHEWABLE_TABLET | Freq: Every day | ORAL | Status: DC

## 2020-04-09 MED ORDER — ACETAMINOPHEN 650 MG RE SUPP: 650.0000 mg | Freq: Four times a day (QID) | RECTAL | Status: DC | PRN

## 2020-04-09 MED ORDER — ENOXAPARIN SODIUM 40 MG/0.4ML ~~LOC~~ SOLN
40.0000 mg | SUBCUTANEOUS | Status: DC
  Administered 2020-04-09 – 2020-04-11 (×3): 40 mg via SUBCUTANEOUS
  Filled 2020-04-09 (×3): qty 0.4

## 2020-04-09 MED ORDER — OXYCODONE HCL 5 MG PO TABS: 5.0000 mg | ORAL_TABLET | ORAL | Status: DC | PRN

## 2020-04-09 MED ORDER — METHYLPREDNISOLONE SODIUM SUCC 125 MG IJ SOLR
80.0000 mg | Freq: Two times a day (BID) | INTRAMUSCULAR | Status: DC
  Administered 2020-04-09 – 2020-04-11 (×5): 80 mg via INTRAVENOUS
  Filled 2020-04-09 (×5): qty 2

## 2020-04-09 MED ORDER — FLUTICASONE FUROATE-VILANTEROL 200-25 MCG/INH IN AEPB
1.0000 | INHALATION_SPRAY | Freq: Every day | RESPIRATORY_TRACT | Status: DC
  Administered 2020-04-09 – 2020-04-11 (×3): 1 via RESPIRATORY_TRACT
  Filled 2020-04-09: qty 28

## 2020-04-09 MED ORDER — SODIUM CHLORIDE 0.9 % IV SOLN
100.0000 mg | Freq: Every day | INTRAVENOUS | Status: DC
  Administered 2020-04-10 – 2020-04-11 (×2): 100 mg via INTRAVENOUS
  Filled 2020-04-09 (×2): qty 20

## 2020-04-09 MED ORDER — ZINC SULFATE 220 (50 ZN) MG PO CAPS
220.0000 mg | ORAL_CAPSULE | Freq: Every day | ORAL | Status: DC
Start: 2020-04-09 — End: 2020-04-11
  Administered 2020-04-09 – 2020-04-11 (×3): 220 mg via ORAL
  Filled 2020-04-09 (×3): qty 1

## 2020-04-09 MED ORDER — ENSURE ENLIVE PO LIQD
237.0000 mL | Freq: Two times a day (BID) | ORAL | Status: DC
  Administered 2020-04-09: 237 mL via ORAL

## 2020-04-09 MED ORDER — VANCOMYCIN HCL 1500 MG/300ML IV SOLN
1500.0000 mg | Freq: Once | INTRAVENOUS | Status: AC
  Administered 2020-04-09: 1500 mg via INTRAVENOUS
  Filled 2020-04-09: qty 300

## 2020-04-09 MED ORDER — ENSURE ENLIVE PO LIQD
237.0000 mL | Freq: Three times a day (TID) | ORAL | Status: DC
  Administered 2020-04-10 – 2020-04-11 (×3): 237 mL via ORAL

## 2020-04-09 MED ORDER — ONDANSETRON HCL 4 MG/2ML IJ SOLN: 4.0000 mg | Freq: Four times a day (QID) | INTRAMUSCULAR | Status: DC | PRN

## 2020-04-09 MED ORDER — HEPARIN SODIUM (PORCINE) 5000 UNIT/ML IJ SOLN: 5000.0000 [IU] | Freq: Three times a day (TID) | INTRAMUSCULAR | Status: DC

## 2020-04-09 MED ORDER — GABAPENTIN 300 MG PO CAPS
300.0000 mg | ORAL_CAPSULE | Freq: Every day | ORAL | Status: DC
  Administered 2020-04-09 – 2020-04-10 (×2): 300 mg via ORAL
  Filled 2020-04-09 (×2): qty 1

## 2020-04-09 MED ORDER — ASPIRIN EC 81 MG PO TBEC
81.0000 mg | DELAYED_RELEASE_TABLET | Freq: Every day | ORAL | Status: DC
  Administered 2020-04-09 – 2020-04-11 (×3): 81 mg via ORAL
  Filled 2020-04-09 (×3): qty 1

## 2020-04-09 MED ORDER — VANCOMYCIN HCL 1750 MG/350ML IV SOLN
1750.0000 mg | INTRAVENOUS | Status: DC
  Filled 2020-04-09: qty 350

## 2020-04-09 MED ORDER — SODIUM CHLORIDE 0.9 % IV SOLN
2.0000 g | Freq: Three times a day (TID) | INTRAVENOUS | Status: DC
  Administered 2020-04-09 – 2020-04-11 (×5): 2 g via INTRAVENOUS
  Filled 2020-04-09 (×5): qty 2

## 2020-04-09 MED ORDER — BARICITINIB 2 MG PO TABS: 4.0000 mg | ORAL_TABLET | Freq: Every day | ORAL | Status: DC

## 2020-04-09 MED ORDER — SODIUM CHLORIDE 0.9 % IV SOLN
200.0000 mg | Freq: Once | INTRAVENOUS | Status: AC
  Administered 2020-04-09: 200 mg via INTRAVENOUS
  Filled 2020-04-09: qty 200

## 2020-04-09 MED ORDER — ONDANSETRON HCL 4 MG PO TABS: 4.0000 mg | ORAL_TABLET | Freq: Four times a day (QID) | ORAL | Status: DC | PRN

## 2020-04-09 MED ORDER — SULFAMETHOXAZOLE-TRIMETHOPRIM 800-160 MG PO TABS
1.0000 | ORAL_TABLET | ORAL | Status: DC
  Administered 2020-04-09 – 2020-04-11 (×2): 1 via ORAL
  Filled 2020-04-09 (×2): qty 1

## 2020-04-09 NOTE — Consult Note (Signed)
NAME:  Grace Moyer, MRN:  [id number], DOB:  10-13-1960, LOS: 0 ADMISSION DATE:  04/08/2020, CONSULTATION DATE:  04/09/20 REFERRING MD:  Posey Pronto - TRH, CHIEF COMPLAINT:  Hypoxia, SOB  Brief History:  60 yo F with COVID who follows with Dr. Vaughan Browner for possible cryptogenic PNA vs ILD, admitted to [facility] for hypoxia and subsequent COVID-19 infection  History of Present Illness:  60 yo F PMH breast cancer, NMO on rituxan, previous provoked PE (2018, s/p 58mo Xarelto), GERD obesity, prior COVID-19 infections (01/2019, summer 2021-asymptomatic, bacterial pneumonia in 01/2020 treated with antibiotics, hypoxia on home O2 who presented to [facility] 04/08/20 with CC SOB superimposed on chronic cough. Sx began 2-3 days ago on exertion, but now is occurring with minimal exertion and with repositioning. Associated fevers to 102 degrees F and hypoxia SpO2 60s. CTA chest obtained, no evidence of PE. Got 1x azithro and rocephin before COVID returned positive. No known exposures. Admitted to [facility] 2/7, started on COVID therapies. Vaccinated x 2, has not been boosted.  Notably, Pt is followed outpatient by [facility] Dr. Vaughan Browner for possible cryptogenic organizing PNA vs ILD. Pt takes prednisone and Bactrim prophylaxis for this.  Admitted to [facility]. On admission continued on bactrim, started on solumedrol, baricitinib, and remdesivir.   Past Medical History:  NMO Immunosuppression (on rituximab for NMO) Covid infections Chronic hypoxic respiratory failure PE GERD  Breast cancer s/p radiation to left breast in 2018  Significant Hospital Events:  2/6 presents to ED with hypoxia SOB 2/7 admitted to [facility]. Third COVID infection. Pulm consulted  Consults:  PCCM  Procedures:    Significant Diagnostic Tests:  2/7 CTA chest> no evidence of PE. Extensive GGO bilaterally, especially in lower lobes  Micro Data:  2/7 COVID +   Antimicrobials:  2/6 azithro, rocephin PTA Bactrim >>>  Interim History / Subjective:     Objective   Blood pressure 113/79, pulse 88, temperature 97.7 F (36.5 C), temperature source Oral, resp. rate (!) 28, height 5\' 3"  (1.6 m), weight 68 kg, SpO2 91 %.        Intake/Output Summary (Last 24 hours) at 04/09/2020 1330 Last data filed at 04/09/2020 0931 Gross per 24 hour  Intake 1470 ml  Output --  Net 1470 ml   Filed Weights   04/08/20 2110  Weight: 68 kg    Examination: General: middle aged woman lying in bed no acute distress HENT: [place]/AT, eyes anicteric Lungs: Breathing comfortably on 5 L nasal cannula, no accessory muscle use.  Decreased basilar breath sounds, faint anterior rales. Cardiovascular: Regular rate and rhythm, no murmurs Abdomen: Obese, soft, nontender, nondistended Extremities: No clubbing, cyanosis, or edema Neuro: Awake, alert, answering questions appropriately, moving all extremities Derm: No cyanosis or ecchymoses, no discoloration on digits.  Resolved Hospital Problem list     Assessment & Plan:   Acute on chronic hypoxic respiratory failure (on home 1-3LO2) Acute COVID-19 pneumonia Post-COVID or post-bacterial inflammatory PNA -- cryptogenic organizing PNA vs another ILD  -followed by Dr. Vaughan Browner for this as an outpatient, on high dose prednisone PTA -immunosuppressed, on rituximab for NMO  -CTA without evidence of PE and with diffuse bilateral airspace disease that seems likely to be due to [place]. Plan: -airborne/ contact isolation -monitor covid biomarkers -cont Bactrim for prophyalxis -cont supplemental O2, IS, pulm hygiene. OOB mobility, prone positioning in bed would be helpful. -agree with solumedrol, remdesivir, baricitinib  -No role for bronchoscopy at this time. Most likely her acute worsening coincides with acute viral pneumonia.  Her symptoms had been improving on steroids as an outpatient since they were increased a few weeks ago. She is at risk for respiratory decompensation associated with sedation for bronchoscopy and there  is likely an alternative explanation (covid). Of concern, she is always at risk of OIs with her chronic immunosuppression. I would recommend starting broad-spectrum antibiotics to covid MRSA and resistant GNRs. If she can expectorate sputum, would recommend collecting a sample. -con't Breo and albuterol PRN -Discussed with her primary Pulmonologist, who agrees.  Hx NMO -on OP rituximab   Hx PE - 2018, provoked PE s/p xarelto Hx L Breast Cancer, in remission   Best practice (evaluated daily)  Diet: reg Pain/Anxiety/Delirium protocol (if indicated): na VAP protocol (if indicated): na DVT prophylaxis: lovenox  GI prophylaxis: na Glucose control: monitor  Mobility: Encourage  Disposition:SDU   Goals of Care:  Last date of multidisciplinary goals of care discussion:per primary Family and staff present:  Summary of discussion:  Follow up goals of care discussion due:  Code Status: Full   Labs   CBC: Recent Labs  Lab 04/08/20 2149 04/09/20 0548  WBC 8.2 6.8  HGB 13.4 10.7*  HCT 43.5 33.0*  MCV 88.6 87.1  PLT 328 660    Basic Metabolic Panel: Recent Labs  Lab 04/08/20 2149 04/09/20 0548  NA 132* 138  K 3.7 3.7  CL 94* 102  CO2 25 24  GLUCOSE 130* 147*  BUN 18 13  CREATININE 0.60 0.62  CALCIUM 9.9 8.6*   GFR: Estimated Creatinine Clearance: 70.9 mL/min (by C-G formula based on SCr of 0.62 mg/dL). Recent Labs  Lab 04/08/20 2149 04/08/20 2301 04/09/20 0548  PROCALCITON  --   --  <0.10  WBC 8.2  --  6.8  LATICACIDVEN  --  2.5* 1.1    Liver Function Tests: Recent Labs  Lab 04/08/20 2149 04/09/20 0548  AST 47* 24  ALT 70* 46*  ALKPHOS 298* 216*  BILITOT 0.7 0.6  PROT 8.3* 5.7*  ALBUMIN 3.9 2.7*   No results for input(s): LIPASE, AMYLASE in the last 168 hours. No results for input(s): AMMONIA in the last 168 hours.  ABG    Component Value Date/Time   HCO3 25.6 04/09/2020 0548   TCO2 25 08/15/2016 1234   O2SAT 73.9 04/09/2020 0548      Coagulation Profile: No results for input(s): INR, PROTIME in the last 168 hours.  Cardiac Enzymes: No results for input(s): CKTOTAL, CKMB, CKMBINDEX, TROPONINI in the last 168 hours.  HbA1C: Hgb A1c MFr Bld  Date/Time Value Ref Range Status  02/10/2019 08:41 AM 6.1 (H) 4.8 - 5.6 % Final    Comment:    (NOTE) Pre diabetes:          5.7%-6.4% Diabetes:              >6.4% Glycemic control for   <7.0% adults with diabetes   08/15/2016 06:57 PM 4.6 (L) 4.8 - 5.6 % Final    Comment:    (NOTE)         Pre-diabetes: 5.7 - 6.4         Diabetes: >6.4         Glycemic control for adults with diabetes: <7.0     CBG: No results for input(s): GLUCAP in the last 168 hours.  Review of Systems:   Review of Systems  Constitutional: Positive for fever.  HENT: Positive for congestion.   Respiratory: Positive for shortness of breath. Negative for sputum production and wheezing.  Cardiovascular: Negative for chest pain and leg swelling.  Gastrointestinal: Negative for diarrhea, heartburn, nausea and vomiting.  Skin: Negative for rash.  Neurological: Negative for weakness.  Endo/Heme/Allergies: Does not bruise/bleed easily.     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   Surgical History:   Past Surgical History:  Procedure Laterality Date  . ABDOMINAL HYSTERECTOMY     partial  . BREAST BIOPSY Left 2017  . BREAST LUMPECTOMY Left    2018  . BREAST LUMPECTOMY WITH RADIOACTIVE SEED AND SENTINEL LYMPH NODE BIOPSY Left 07/31/2016   Procedure: LEFT BREAST RADIOACTIVE SEED GUIDED LUMPECTOMY WITH LEFT RADIOACTIVE SEED TARGETED AXILLARY SENTINEL LYMPH NODE EXCISION AND  SENTINEL LYMPH NODE BIOPSY;  Surgeon: Rolm Bookbinder, MD;  Location: [place];  Service: General;  Laterality: Left;  . BREAST REDUCTION SURGERY Bilateral 08/05/2016   Procedure: LEFT ONCOPLASTIC REDUCTION; RIGHT BREAST REDUCTION;  Surgeon: Irene Limbo, MD;  Location: [place];  Service: Plastics;  Laterality: Bilateral;  . EYE SURGERY    . PORT-A-CATH REMOVAL Right 07/31/2016   Procedure: REMOVAL PORT-A-CATH;  Surgeon: Rolm Bookbinder, MD;  Location: [place];  Service: General;  Laterality: Right;  . PORTACATH PLACEMENT Right 02/11/2016   Procedure: INSERTION PORT-A-CATH WITH [place];  Surgeon: Rolm Bookbinder, MD;  Location: [place];  Service: General;  Laterality: Right;  . REDUCTION MAMMAPLASTY       Social History:   reports that she quit smoking about 18 years ago. Her smoking use included cigarettes. She has never used smokeless tobacco. She reports current alcohol use. She reports that she does not use drugs.   Family History:  Her family history includes Breast cancer in her cousin and maternal grandmother; CAD in her paternal grandfather and paternal grandmother; COPD in her maternal grandfather; Colon cancer in her paternal uncle; Lung cancer in her paternal uncle.   Allergies Allergies  Allergen Reactions  . Hydrocodone Other (See Comments)    Did not help with pain and kept her up all night     Home Medications  Prior to Admission medications   Medication Sig Start Date End Date Taking? Authorizing Provider  acetaminophen (TYLENOL) 500 MG tablet Take 1,000 mg by mouth every 6 (six) hours as needed for fever or mild pain.   Yes [provider]  albuterol (VENTOLIN HFA) 108 (90 Base) MCG/ACT inhaler Inhale 2 puffs into the lungs every 4 (four) hours as needed for wheezing or shortness of breath. 02/10/20  Yes Lauraine Rinne, NP  aspirin 81 MG tablet Take 1 tablet (81 mg total) by mouth daily.  03/30/17  Yes Magrinat, Virgie Dad, MD  benzonatate (TESSALON) 200 MG capsule Take 1 capsule (200 mg total) by mouth in the morning, at noon, and at bedtime. 03/22/20  Yes Mannam, Praveen, MD  Cholecalciferol 25 MCG (1000 UT) tablet Take 5,000 Units by mouth daily.    Yes [provider]  fluticasone furoate-vilanterol (BREO ELLIPTA) 200-25 MCG/INH AEPB Inhale 1 puff into the lungs daily. 03/22/20  Yes Mannam, Praveen, MD  gabapentin (NEURONTIN) 300 MG capsule Take 300 mg by mouth at bedtime.   Yes [provider]  guaiFENesin (MUCINEX) 600 MG 12 hr tablet Take 600  mg by mouth 2 (two) times daily as needed for cough or to loosen phlegm.   Yes [provider]  Melatonin 10 MG TABS Take 20 mg by mouth at bedtime.   Yes [provider]  predniSONE (DELTASONE) 10 MG tablet Take 1 tablet (10 mg total) by mouth daily. Patient taking differently: Take 40 mg by mouth daily. 03/23/20  Yes Mannam, Praveen, MD  sulfamethoxazole-trimethoprim (BACTRIM DS) 800-160 MG tablet Take 1 tablet by mouth 3 (three) times a week. Patient taking differently: Take 1 tablet by mouth 3 (three) times a week. Mon, Wed, Fri 03/23/20  Yes Mannam, Praveen, MD  vitamin C (VITAMIN C) 500 MG tablet Take 1 tablet (500 mg total) by mouth daily. 02/14/19  Yes Barton Dubois, MD  zinc sulfate 220 (50 Zn) MG capsule Take 1 capsule (220 mg total) by mouth daily. Patient taking differently: Take 50 mg by mouth daily. 02/14/19  Yes Barton Dubois, MD  pantoprazole (PROTONIX) 40 MG tablet Take 1 tablet (40 mg total) by mouth daily. 02/13/19 02/27/19  Barton Dubois, MD         Julian Hy, DO 04/09/20 4:05 PM [facility]  From 7AM- 7PM if no response to pager, please call [phone]. After hours, 7PM- 7AM, please call Elink  [phone].

## 2020-04-09 NOTE — Progress Notes (Signed)
   04/09/20 0741  Assess: MEWS Score  Temp 98.5 F (36.9 C)  BP 105/64  Pulse Rate 77  ECG Heart Rate 72  Resp (!) 26  Level of Consciousness Alert  SpO2 99 %  O2 Device HFNC  Patient Activity (if Appropriate) In bed  O2 Flow Rate (L/min) 8 L/min  Assess: MEWS Score  MEWS Temp 0  MEWS Systolic 0  MEWS Pulse 0  MEWS RR 2  MEWS LOC 0  MEWS Score 2  MEWS Score Color Yellow  Assess: if the MEWS score is Yellow or Red  Were vital signs taken at a resting state? Yes  Focused Assessment No change from prior assessment  Early Detection of Sepsis Score *See Row Information* Low  MEWS guidelines implemented *See Row Information* Yes  Treat  MEWS Interventions Administered scheduled meds/treatments  Pain Scale 0-10  Pain Score 0  Take Vital Signs  Increase Vital Sign Frequency  Yellow: Q 2hr X 2 then Q 4hr X 2, if remains yellow, continue Q 4hrs  Escalate  MEWS: Escalate Yellow: discuss with charge nurse/RN and consider discussing with provider and RRT  Notify: Charge Nurse/RN  Name of Charge Nurse/RN Notified Erin, RN  Date Charge Nurse/RN Notified 04/09/20  Time Charge Nurse/RN Notified 0745  Notify: Provider  Provider Name/Title Berle Mull, MD  Date Provider Notified 04/09/20  Time Provider Notified [phone]  Notification Type Page  Notification Reason Other (Comment) (per protocol)  Response No new orders  Notify: Rapid Response  Name of Rapid Response RN Notified N/A  Document  Patient Outcome Other (Comment) (Patient is stable.)  Progress note created (see row info) Yes

## 2020-04-09 NOTE — Progress Notes (Signed)
Initial Nutrition Assessment  DOCUMENTATION CODES:   Not applicable  INTERVENTION:  Provide Ensure Enlive po TID, each supplement provides 350 kcal and 20 grams of protein.  Encourage adequate PO intake.   NUTRITION DIAGNOSIS:   Increased nutrient needs related to catabolic illness (COVID) as evidenced by estimated needs.  GOAL:   Patient will meet greater than or equal to 90% of their needs  MONITOR:   PO intake,Supplement acceptance,Skin,Weight trends,Labs,I & O's  REASON FOR ASSESSMENT:   Malnutrition Screening Tool    ASSESSMENT:   61 yo F PMH breast cancer, NMO on rituxan, GERD obesity, prior COVID-19 infections (01/2019 and summer 2021) presents with SOB, cough, fever. Pt with possible cryptogenic PNA vs ILD, admitted to [facility] for hypoxia and subsequent COVID-19 infection.   Pt unavailable during attempted time of contact. RD unable to obtain pt nutrition history at this time. Meal completion has been 25%. RD to order nutritional supplements to aid in caloric and protein needs. Unable to complete Nutrition-Focused physical exam at this time.   Labs and medications reviewed.   Diet Order:   Diet Order            Diet Heart Room service appropriate? Yes; Fluid consistency: Thin  Diet effective now                 EDUCATION NEEDS:   Not appropriate for education at this time  Skin:  Skin Assessment: Reviewed RN Assessment  Last BM:  PTA  Height:   Ht Readings from Last 1 Encounters:  04/08/20 5\' 3"  (1.6 m)    Weight:   Wt Readings from Last 1 Encounters:  04/08/20 68 kg    BMI:  Body mass index is 26.57 kg/m.  Estimated Nutritional Needs:   Kcal:  5956-3875  Protein:  85-100 grams  Fluid:  >/= 1.8 L/day  Corrin Parker, MS, RD, LDN RD pager number/after hours weekend pager number on Amion.

## 2020-04-09 NOTE — Progress Notes (Signed)
Patient started experiencing facial flushing around 7am today with extreme hot feeling to facial skin. Oral temperature was normal. A cool washcloth and fan were given to patient. Dr.Patel notified and ordered PRN Benadryl PO. Per patient, relief was felt after Benadryl administration. No other symptoms were experienced by the patient.

## 2020-04-09 NOTE — Progress Notes (Signed)
Grace Moyer [id number] Admission Data: 04/09/2020 6:14 AM Attending Provider: Lavina Hamman, MD  [id number], Grace Reichmann, MD Consults/ Treatment Team:   Grace Moyer is a 60 y.o. female patient admitted from ED awake, alert  & orientated  X 3,  Full Code, VSS - Blood pressure 117/79, pulse 94, temperature 99 F (37.2 C), temperature source Oral, resp. rate (!) 32, height 5\' 3"  (1.6 m), weight 68 kg, SpO2 97 %., O2    8 L HF nasal cannula, no c/o shortness of breath, no c/o chest pain, no distress noted. Pt on cont bedside monitoring and pt is currently running:normal sinus rhythm, sinus tachycardia.   IV site WDL:  forearm right, condition patent and no redness with a transparent dsg that's clean dry and intact.  Allergies:   Allergies  Allergen Reactions  . Hydrocodone Other (See Comments)    Did not help with pain and kept her up all night     Past Medical History:  Diagnosis Date  . Anxiety   . Breast cancer ([place])   . Breast cancer ([place])   . Family history of breast cancer   . Family history of colon cancer   . Malignant neoplasm of upper-outer quadrant of left female breast ([place]) 01/31/2016  . 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 Personal history of chemotherapy   . Personal history of radiation therapy     History:  obtained from chart review. Tobacco/alcohol: Quit tobacco use 18 years ago social drinker  Pt orientated to unit, room and routine. Information packet given to patient.  Admission INP armband ID verified with patient, and in place. SR up x 2, fall risk assessment complete with Patient and verbalizing understanding of risks associated with falls. Pt verbalizes an understanding of how to use the call bell and to call for help before getting out of bed.  Skin, clean-dry- intact without evidence of bruising, or skin tears.   No evidence of skin break down noted on exam. no rashes, no ecchymoses, no petechiae, no nodules, no jaundice, no purpura, no wounds, no  acanthosis nigricans, no striae    Will cont to monitor and assist as needed.  [place] G Tyishia Aune, RN 04/09/2020 6:14 AM

## 2020-04-09 NOTE — H&P (Signed)
TRH H&P    Patient Demographics:    Antoninette Lerner, is a 60 y.o. female  MRN: [id number]  DOB - 1960/05/21  Admit Date - 04/08/2020  Referring MD/NP/PA: Ashok Cordia  Outpatient Primary MD for the patient is Sharilyn Sites, MD  Patient coming from: Home  Chief complaint- dyspnea   HPI:    Tennyson Kallen  is a 60 y.o. 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  Patient reports that she has been on oxygen for a couple of months now, wearing 1 L at rest and 3-4 L with exertion at home.  Today at rest she was feeling short of breath, and at rest she was requiring 4 L to maintain her oxygen saturations.  With any minimal exertion, even sitting up from a laying down position her oxygen saturations would drop into the 70s.  She did see an oxygen saturation as low as 63%.  She had associated chest tightness, dizziness, fatigue.  She reports intermittent fevers over the past few days with a T-max of 102.  She reports associated cough that has clear phlegm.  She does not have palpitations.  Patient has been undergoing extensive pulmonary work-up with Dr. Vaughan Browner.  Last note he explains the patient has had COVID twice.  Last time she was treated with 4 days of remdesivir and a course of steroids.  She then had post viral pneumonia and was treated with ceftriaxone and azithromycin followed by doxycycline.  She improved but she continued to have cough and she had a 30 pound weight loss since November 2021.  She has started her on prednisone 20 mg daily.  Pulmonology note reports that patient is more susceptible to infection because of the rituximab.  He was questioning cryptogenic organizing pneumonia.  He started her on Bactrim double strength three times a week, and reports that she may need bronchoscopy.  Even his history we have  elected to admit patient to [facility] where she can be seen by her pulmonology group.  Full code.  She is fully vaccinated for COVID but has not had her booster, this is her third Covid infection.  Patient is a former smoker who quit 18 years ago, she drinks wine twice a year, she does not use illicit drugs.  In the ED Temp 99.2, heart rate 108, respiratory rate twenty-seven, blood pressure 123/79, satting at 98% on 8 L high flow nasal cannula CT angio was negative exam for PE, but did show multifocal groundglass opacities most prominent in the lower lobes. Covid test positive Blood cultures grown Prior to Covid test result patient was given Rocephin and Zithromax White blood cell count 8.2, hemoglobin 13.4 Chemistry panel is unremarkable Alk phos is elevated at 298, mild transaminitis with an AST of forty-seven and ALT of seventy D-dimer 1.02 Chest x-ray showed slight interval worsening in that patchy airspace opacities EKG showed a heart rate of 113, QTc 417, sinus tach    Review of systems:  In addition to the HPI above,  Admits to fever No Headache, No changes with Vision or hearing, No problems swallowing food or Liquids, Admits to chest tightness, cough and shortness of Breath, No Abdominal pain, No Nausea or Vomiting, bowel movements are regular, No Blood in stool or Urine, No dysuria, No new skin rashes or bruises, No new joints pains-aches,  No new weakness, tingling, numbness in any extremity, No recent weight gain or loss, No polyuria, polydypsia or polyphagia,  All other systems reviewed and are negative.    Past History of the following :    Past Medical History:  Diagnosis Date  . Anxiety   . Breast cancer ([place])   . Breast cancer ([place])   . Family history of breast cancer   . Family history of colon cancer   . Malignant neoplasm of upper-outer quadrant of left female breast ([place]) 01/31/2016  . Neuromyelitis optica ([place])   . Personal history of  chemotherapy   . Personal history of radiation therapy       Past Surgical History:  Procedure Laterality Date  . ABDOMINAL HYSTERECTOMY     partial  . BREAST BIOPSY Left 2017  . BREAST LUMPECTOMY Left    2018  . BREAST LUMPECTOMY WITH RADIOACTIVE SEED AND SENTINEL LYMPH NODE BIOPSY Left 07/31/2016   Procedure: LEFT BREAST RADIOACTIVE SEED GUIDED LUMPECTOMY WITH LEFT RADIOACTIVE SEED TARGETED AXILLARY SENTINEL LYMPH NODE EXCISION AND SENTINEL LYMPH NODE BIOPSY;  Surgeon: Rolm Bookbinder, MD;  Location: [facility];  Service: General;  Laterality: Left;  . BREAST REDUCTION SURGERY Bilateral 08/05/2016   Procedure: LEFT ONCOPLASTIC REDUCTION; RIGHT BREAST REDUCTION;  Surgeon: Irene Limbo, MD;  Location: [facility];  Service: Plastics;  Laterality: Bilateral;  . EYE SURGERY    . PORT-A-CATH REMOVAL Right 07/31/2016   Procedure: REMOVAL PORT-A-CATH;  Surgeon: Rolm Bookbinder, MD;  Location: [facility];  Service: General;  Laterality: Right;  . PORTACATH PLACEMENT Right 02/11/2016   Procedure: INSERTION PORT-A-CATH WITH [place];  Surgeon: Rolm Bookbinder, MD;  Location: [facility];  Service: General;  Laterality: Right;  . REDUCTION MAMMAPLASTY        Social History:      Social History   Tobacco Use  . Smoking status: Former Smoker    Types: Cigarettes    Quit date: 09/03/2001    Years since quitting: 18.6  . Smokeless tobacco: Never Used  Substance Use Topics  . Alcohol use: Yes    Comment: social       Family History :     Family History  Problem Relation Age of Onset  . Breast cancer Maternal Grandmother        dx in her 24s  . CAD Paternal Grandmother   . Colon cancer Paternal Uncle        dx in his 13s  . COPD Maternal Grandfather   . CAD Paternal Grandfather   . Breast cancer Cousin        dx in her early to mid 73s; maternal first cousin  . Lung cancer Paternal Uncle       Home Medications:    Prior to Admission medications   Medication Sig Start Date End Date Taking? Authorizing Provider  albuterol (VENTOLIN HFA) 108 (90 Base) MCG/ACT inhaler Inhale 2 puffs into the lungs every 4 (four) hours as needed for wheezing or shortness of breath. 02/10/20   Lauraine Rinne, NP  aspirin 81 MG tablet Take 1 tablet (  81 mg total) by mouth daily. 03/30/17   Magrinat, Virgie Dad, MD  benzonatate (TESSALON) 200 MG capsule Take 1 capsule (200 mg total) by mouth in the morning, at noon, and at bedtime. 03/22/20   Marshell Garfinkel, MD  Cholecalciferol 25 MCG (1000 UT) tablet Take 5,000 Units by mouth daily.     [provider]  fluticasone furoate-vilanterol (BREO ELLIPTA) 200-25 MCG/INH AEPB Inhale 1 puff into the lungs daily. 03/22/20   Mannam, Hart Robinsons, MD  gabapentin (NEURONTIN) 300 MG capsule Take 300 mg by mouth at bedtime.    [provider]  predniSONE (DELTASONE) 10 MG tablet Take 1 tablet (10 mg total) by mouth daily. 03/23/20   Mannam, Hart Robinsons, MD  sulfamethoxazole-trimethoprim (BACTRIM DS) 800-160 MG tablet Take 1 tablet by mouth 3 (three) times a week. 03/23/20   Mannam, Hart Robinsons, MD  vitamin C (VITAMIN C) 500 MG tablet Take 1 tablet (500 mg total) by mouth daily. 02/14/19   Barton Dubois, MD  zinc sulfate 220 (50 Zn) MG capsule Take 1 capsule (220 mg total) by mouth daily. Patient taking differently: Take 50 mg by mouth daily. 02/14/19   Barton Dubois, MD  pantoprazole (PROTONIX) 40 MG tablet Take 1 tablet (40 mg total) by mouth daily. 02/13/19 02/27/19  Barton Dubois, MD     Allergies:     Allergies  Allergen Reactions  . Hydrocodone Other (See Comments)    Did not help with pain and kept her up all night     Physical Exam:   Vitals  Blood pressure 108/64, pulse (!) 110, temperature 99.2 F (37.3 C), temperature source Oral, resp. rate (!) 26, height $RemoveBe'5\' 3"'zIIipkZuR$  (1.6 m), weight 68 kg, SpO2 97 %.  1.  General: Patient lying supine in bed ill-appearing  2.  Psychiatric: Mood and behavior normal for situation, cooperative with exam  3. Neurologic: Face is symmetric, speech and language are normal, moves all four extremities voluntarily, no focal deficit on limited exam  4. HEENMT:  Head is atraumatic, normocephalic, pupils reactive to light, neck is supple, trachea is midline  5. Respiratory : Minimal wheezing heard in the lower lung fields, no rhonchi, no crackles, no cyanosis  6. Cardiovascular : Heart rate is tachycardic, rhythm is regular, no murmurs rubs or gallops  7. Gastrointestinal:  Abdomen is soft, nondistended, nontender to palpation  8. Skin:  Skin is warm dry and intact without acute lesion  9.Musculoskeletal:  No calf tenderness, peripheral pulses palpated, no acute deformities    Data Review:    CBC Recent Labs  Lab 04/08/20 2149  WBC 8.2  HGB 13.4  HCT 43.5  PLT 328  MCV 88.6  MCH 27.3  MCHC 30.8  RDW 17.2*   ------------------------------------------------------------------------------------------------------------------  Results for orders placed or performed during the hospital encounter of 04/08/20 (from the past 48 hour(s))  CBC     Status: Abnormal   Collection Time: 04/08/20  9:49 PM  Result Value Ref Range   WBC 8.2 4.0 - 10.5 K/uL   RBC 4.91 3.87 - 5.11 MIL/uL   Hemoglobin 13.4 12.0 - 15.0 g/dL   HCT 43.5 36.0 - 46.0 %   MCV 88.6 80.0 - 100.0 fL   MCH 27.3 26.0 - 34.0 pg   MCHC 30.8 30.0 - 36.0 g/dL   RDW 17.2 (H) 11.5 - 15.5 %   Platelets 328 150 - 400 K/uL   nRBC 0.0 0.0 - 0.2 %    Comment: Performed at [facility], [address],  [place], [place] [address]  Comprehensive metabolic panel     Status: Abnormal   Collection Time: 04/08/20  9:49 PM  Result Value Ref Range   Sodium 132 (L) 135 - 145 mmol/L   Potassium 3.7 3.5 - 5.1 mmol/L   Chloride 94 (L) 98 - 111 mmol/L   CO2 25 22 - 32 mmol/L   Glucose, Bld 130 (H) 70 - 99 mg/dL    Comment: Glucose reference range applies only to  samples taken after fasting for at least 8 hours.   BUN 18 6 - 20 mg/dL   Creatinine, Ser 0.60 0.44 - 1.00 mg/dL   Calcium 9.9 8.9 - 10.3 mg/dL   Total Protein 8.3 (H) 6.5 - 8.1 g/dL   Albumin 3.9 3.5 - 5.0 g/dL   AST 47 (H) 15 - 41 U/L   ALT 70 (H) 0 - 44 U/L   Alkaline Phosphatase 298 (H) 38 - 126 U/L   Total Bilirubin 0.7 0.3 - 1.2 mg/dL   GFR, Estimated >60 >60 mL/min    Comment: (NOTE) Calculated using the CKD-EPI Creatinine Equation (2021)    Anion gap 13 5 - 15    Comment: Performed at [facility], [address] [place], [place] [address]  Brain natriuretic peptide     Status: None   Collection Time: 04/08/20  9:49 PM  Result Value Ref Range   B Natriuretic Peptide 47.0 0.0 - 100.0 pg/mL    Comment: Performed at [facility], [address], [place], [place] [address]  Troponin I (High Sensitivity)     Status: None   Collection Time: 04/08/20  9:49 PM  Result Value Ref Range   Troponin I (High Sensitivity) 5 <18 ng/L    Comment: (NOTE) Elevated high sensitivity troponin I (hsTnI) values and significant  changes across serial measurements may suggest ACS but many other  chronic and acute conditions are known to elevate hsTnI results.  Refer to the "Links" section for chest pain algorithms and additional  guidance. Performed at [facility], [address], [place], [place] [address]   D-dimer, quantitative (not at [facility])     Status: Abnormal   Collection Time: 04/08/20  9:49 PM  Result Value Ref Range   D-Dimer, Quant 1.02 (H) 0.00 - 0.50 ug/mL-FEU    Comment: (NOTE) At the manufacturer cut-off value of 0.5 g/mL FEU, this assay has a negative predictive value of 95-100%.This assay is intended for use in conjunction with a clinical pretest probability (PTP) assessment model to exclude pulmonary embolism (PE) and deep venous thrombosis (DVT) in outpatients suspected of PE or DVT. Results should be correlated with clinical presentation. Performed at [facility],  [address] [place], [place] [address]   Lactic acid, plasma     Status: Abnormal   Collection Time: 04/08/20 11:01 PM  Result Value Ref Range   Lactic Acid, Venous 2.5 (HH) 0.5 - 1.9 mmol/L    Comment: CRITICAL RESULT CALLED TO, READ BACK BY AND VERIFIED WITH: NICHOL,K_0  BY MATTHEWS,B 2.6.22 Performed at [facility], [address], [place], [place] [address]   Blood culture (routine x 2)     Status: None (Preliminary result)   Collection Time: 04/08/20 11:02 PM   Specimen: BLOOD  Result Value Ref Range   Specimen Description BLOOD BLOOD RIGHT HAND    Special Requests      BOTTLES DRAWN AEROBIC AND ANAEROBIC Blood Culture results may not be optimal due to an inadequate volume of blood received in culture bottles  Performed at [facility], [address], [place], [place] [address]    Culture PENDING    Report Status PENDING   Blood culture (routine x 2)     Status: None (Preliminary result)   Collection Time: 04/08/20 11:03 PM   Specimen: BLOOD  Result Value Ref Range   Specimen Description BLOOD BLOOD RIGHT FOREARM    Special Requests      BOTTLES DRAWN AEROBIC AND ANAEROBIC Blood Culture results may not be optimal due to an inadequate volume of blood received in culture bottles Performed at [facility], [address], [place], [place] [address]    Culture PENDING    Report Status PENDING   SARS Coronavirus 2 by RT PCR (hospital order, performed in [facility] hospital lab) Nasopharyngeal Nasopharyngeal Swab     Status: Abnormal   Collection Time: 04/08/20 11:05 PM   Specimen: Nasopharyngeal Swab  Result Value Ref Range   SARS Coronavirus 2 POSITIVE (A) NEGATIVE    Comment: RESULT CALLED TO, READ BACK BY AND VERIFIED WITH: WALKER,T_0  BYMATTHEWS,B 2.7.22 (NOTE) SARS-CoV-2 target nucleic acids are DETECTED  SARS-CoV-2 RNA is generally detectable in upper respiratory specimens  during the acute phase of infection.  Positive results are indicative  of the presence of the  identified virus, but do not rule out bacterial infection or co-infection with other pathogens not detected by the test.  Clinical correlation with patient history and  other diagnostic information is necessary to determine patient infection status.  The expected result is negative.  Fact Sheet for Patients:   StrictlyIdeas.no   Fact Sheet for Healthcare Providers:   BankingDealers.co.za    This test is not yet approved or cleared by the [place] FDA and  has been authorized for detection and/or diagnosis of SARS-CoV-2 by FDA under an Emergency Use Authorization (EUA).  This EUA will remain in effect (meaning this tes t can be used) for the duration of  the COVID-19 declaration under Section 564(b)(1) of the Act, 21 U.S.C. section 360-bbb-3(b)(1), unless the authorization is terminated or revoked sooner.  Performed at [facility], [address], [place], [place] [address]   Troponin I (High Sensitivity)     Status: None   Collection Time: 04/08/20 11:58 PM  Result Value Ref Range   Troponin I (High Sensitivity) 6 <18 ng/L    Comment: (NOTE) Elevated high sensitivity troponin I (hsTnI) values and significant  changes across serial measurements may suggest ACS but many other  chronic and acute conditions are known to elevate hsTnI results.  Refer to the "Links" section for chest pain algorithms and additional  guidance. Performed at [facility], [address], [place], [place] [address]     Chemistries  Recent Labs  Lab 04/08/20 2149  NA 132*  K 3.7  CL 94*  CO2 25  GLUCOSE 130*  BUN 18  CREATININE 0.60  CALCIUM 9.9  AST 47*  ALT 70*  ALKPHOS 298*  BILITOT 0.7   ------------------------------------------------------------------------------------------------------------------  ------------------------------------------------------------------------------------------------------------------ GFR: Estimated  Creatinine Clearance: 70.9 mL/min (by C-G formula based on SCr of 0.6 mg/dL). Liver Function Tests: Recent Labs  Lab 04/08/20 2149  AST 47*  ALT 70*  ALKPHOS 298*  BILITOT 0.7  PROT 8.3*  ALBUMIN 3.9   No results for input(s): LIPASE, AMYLASE in the last 168 hours. No results for input(s): AMMONIA in the last 168 hours. Coagulation Profile: No results for input(s): INR, PROTIME in the last 168 hours. Cardiac Enzymes: No results for input(s): CKTOTAL, CKMB,  CKMBINDEX, TROPONINI in the last 168 hours. BNP (last 3 results) No results for input(s): PROBNP in the last 8760 hours. HbA1C: No results for input(s): HGBA1C in the last 72 hours. CBG: No results for input(s): GLUCAP in the last 168 hours. Lipid Profile: No results for input(s): CHOL, HDL, LDLCALC, TRIG, CHOLHDL, LDLDIRECT in the last 72 hours. Thyroid Function Tests: No results for input(s): TSH, T4TOTAL, FREET4, T3FREE, THYROIDAB in the last 72 hours. Anemia Panel: No results for input(s): VITAMINB12, FOLATE, FERRITIN, TIBC, IRON, RETICCTPCT in the last 72 hours.  --------------------------------------------------------------------------------------------------------------- Urine analysis:    Component Value Date/Time   COLORURINE YELLOW 02/11/2019 0025   APPEARANCEUR CLEAR 02/11/2019 0025   LABSPEC 1.020 02/11/2019 0025   PHURINE 6.0 02/11/2019 0025   GLUCOSEU NEGATIVE 02/11/2019 0025   HGBUR NEGATIVE 02/11/2019 0025   BILIRUBINUR NEGATIVE 02/11/2019 0025   KETONESUR 20 (A) 02/11/2019 0025   PROTEINUR NEGATIVE 02/11/2019 0025   UROBILINOGEN 0.2 04/08/2007 0620   NITRITE NEGATIVE 02/11/2019 0025   LEUKOCYTESUR NEGATIVE 02/11/2019 0025      Imaging Results:    DG Chest 2 View  Result Date: 04/08/2020 CLINICAL DATA:  Shortness of breath EXAM: CHEST - 2 VIEW COMPARISON:  February 10, 2020 FINDINGS: The heart size and mediastinal contours are within normal limits. Overall shallow degree of aeration with patchy  airspace opacities seen throughout both lower lungs. There appears to be slight interval worsening since the prior exam. No pleural effusion. No acute osseous abnormality. IMPRESSION: Slight interval worsening in the patchy airspace opacities in both lower lungs which could be due to multifocal pneumonia. Electronically Signed   By: Prudencio Pair M.D.   On: 04/08/2020 22:05   CT Angio Chest PE W/Cm &/Or Wo Cm  Result Date: 04/08/2020 CLINICAL DATA:  Increased shortness of breath with exertion. Recently diagnosed with pneumonia. EXAM: CT ANGIOGRAPHY CHEST WITH CONTRAST TECHNIQUE: Multidetector CT imaging of the chest was performed using the standard protocol during bolus administration of intravenous contrast. Multiplanar CT image reconstructions and MIPs were obtained to evaluate the vascular anatomy. CONTRAST:  127m OMNIPAQUE IOHEXOL 350 MG/ML SOLN COMPARISON:  Chest radiograph April 08, 2020. FINDINGS: Cardiovascular: Satisfactory opacification of the pulmonary arteries to the segmental level. No evidence of pulmonary embolism. Normal heart size. No pericardial effusion. Mediastinum/Nodes: No enlarged mediastinal, hilar, or axillary lymph nodes. Thyroid gland and trachea demonstrate no significant findings. Small hiatal hernia. Lungs/Pleura: Extensive multifocal consolidations and ground-glass opacities most prominent in the lower lobes. Upper Abdomen: No acute abnormality. Musculoskeletal: Mild thoracic spondylosis. Review of the MIP images confirms the above findings. IMPRESSION: 1. Negative examination for pulmonary embolism. 2. Extensive multifocal consolidations and ground-glass opacities most prominent in the lower lobes, concerning for multifocal pneumonia. However, interstitial lung disease could appear and given patient's history of possible post COVID interstitial lung disease recommend pulmonary consult if not previously performed. Electronically Signed   By: JDahlia BailiffMD   On: 04/08/2020  23:33    My personal review of EKG: Rhythm sinus tach, Rate 113 /min, QTc 417 ,no Acute ST changes   Assessment & Plan:    Principal Problem:   Acute respiratory failure with hypoxia (HCC) Active Problems:   Lactic acidosis   Pneumonia due to COVID-19 virus   1. Acute on chronic hypoxic respiratory failure 1. Patient reports oxygen saturations as low as 63% at home 2. Requiring 8 L high flow nasal cannula in the ED 3. History of extensive pulmonary work-up outpatient with possible cryptogenic organizing pneumonia  4. Continue bactrim TID 5. Secondary to Covid CT treatment plan below 6. Chronic lung disease could also be contributing -follows with Pulm  7. VBG pending 8. CTA ruled out PE 9. Monitor on progressive 2. Lactic acidosis 1. Likely secondary to above 2. Trend another lactic 3. Continue treatment plan as above 3. Covid pneumonia 1. Initially treated with Rocephin and Zithromax prior to Covid test returned positive 2. Start baricitinib 3. Start Solu-Medrol twice daily 4. Continue inhalers 5. Continue oxygen supplementation as needed 6. This is patient's third time having Covid     DVT Prophylaxis-   Heparin- SCDs   AM Labs Ordered, also please review Full Orders  Family Communication: No family at bedside Code Status: Full  Admission status: Inpatient :The appropriate admission status for this patient is INPATIENT. Inpatient status is judged to be reasonable and necessary in order to provide the required intensity of service to ensure the patient's safety. The patient's presenting symptoms, physical exam findings, and initial radiographic and laboratory data in the context of their chronic comorbidities is felt to place them at high risk for further clinical deterioration. Furthermore, it is not anticipated that the patient will be medically stable for discharge from the hospital within 2 midnights of admission. The following factors support the admission status of  inpatient.     The patient's presenting symptoms include dyspnea The worrisome physical exam findings include hypoxia requiring 8 L high flow nasal cannula The initial radiographic and laboratory data are worrisome because of CT shows multifocal groundglass opacities The chronic co-morbidities include neuromyelitis optica, anxiety, history of breast cancer       * I certify that at the point of admission it is my clinical judgment that the patient will require inpatient hospital care spanning beyond 2 midnights from the point of admission due to high intensity of service, high risk for further deterioration and high frequency of surveillance required.*  Time spent in minutes : [place]

## 2020-04-09 NOTE — Progress Notes (Signed)
Pharmacy Antibiotic Note  Grace Moyer is a 60 y.o. female admitted on 04/08/2020 with hypoxia and shortness of breath d/t covid.  Pharmacy has been consulted for vancomycin and cefepime dosing.  Patient currently afebrile to 99.2, wbc wnl at 6.8. Renal function is normal. Patient on 3rd covid infection, immunosuppressants with rituximab prior to admit. Cover with broad specturm antibiotics.   Vancomycin 1500mg  IV x 1 then 1750 mg IV Q 24 hrs. Goal AUC 400-550. Expected AUC: 494 SCr used: 0.62   Plan: Vancomycin 1750 q24 hours Cefepime 2g q8 hours Follow up any culture data Vancomycin level at steady state  Height: 5\' 3"  (160 cm) Weight: 68 kg (150 lb) IBW/kg (Calculated) : 52.4  Temp (24hrs), Avg:98.7 F (37.1 C), Min:97.7 F (36.5 C), Max:99.2 F (37.3 C)  Recent Labs  Lab 04/08/20 2149 04/08/20 2301 04/09/20 0548  WBC 8.2  --  6.8  CREATININE 0.60  --  0.62  LATICACIDVEN  --  2.5* 1.1    Estimated Creatinine Clearance: 70.9 mL/min (by C-G formula based on SCr of 0.62 mg/dL).    Allergies  Allergen Reactions  . Hydrocodone Other (See Comments)    Did not help with pain and kept her up all night   Thank you for allowing pharmacy to be a part of this patient's care.  Erin Hearing PharmD., BCPS Clinical Pharmacist 04/09/2020 5:32 PM

## 2020-04-09 NOTE — ED Notes (Signed)
Date and time results received: 04/09/20 0032  Test: covid Critical Value: positive  Name of Provider Notified: Mesner, MD  Orders Received? Or Actions Taken?: acknowledged

## 2020-04-09 NOTE — Progress Notes (Signed)
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  Presents with complaints of shortness of breath with hypoxia and fever.  Found to have multifocal pneumonia with Covid positive test.   Currently plan is continue current plan and follow-up on pulmonary recommendation.  Assessment and Plan: 1. Acute on chronic hypoxic respiratory failure, POA, 86% on 4 LPM  on admission.  Acute COVID-19 Viral Pneumonia On 4 LPM at home. CXR: hazy bilateral peripheral opacities CT chest: GGO, consolidation, negative for PE CT value for SARS Covid test 24.9.  Oxygen requirement: 8 L HFNC 99 to 97% CRP: 13.3 Remdesivir: Currently not given Steroids: On IV Solu-Medrol 60 twice daily.  Received Solu-Cortef 100 mg on admission Baricitinib/Actemra: Initially started but did not receive the medicine.  Currently holding off until bacterial infection is ruled out.  Antibiotics: Received 1 dose of IV ceftriaxone and azithromycin, procalcitonin negative.  Initiated on IV vancomycin and cefepime.  Check MRSA PCR. DVT Prophylaxis: enoxaparin (LOVENOX) injection 40 mg Start: 04/09/20 1000 SCDs Start: 04/09/20 0514  Prone positioning and incentive spirometer use recommended.  Overall plan: Appreciate pulmonary consultation.  Continue supportive pulmonary care and monitor culture and infection work-up  Isolation duration: First positive test date 04/08/2020  The treatment plan and use of medications and known side effects were discussed with patient/family. It was clearly explained that complete risks and long-term side effects are unknown. Patient/family agree with the treatment plan.    2.  Post Covid pneumonia as well as ILD We will consult pulmonary  given her frequent pneumonia presentation. There has been concern for representing organizing pneumonia for her in the past. Currently continue on IV steroids.  3.  Elevated LFT Improving.  Monitor.  4.  History of breast cancer Currently in remission.  Monitor.  5.  Neuromyelitis optica On rituximab outpatient.  Last therapy on October 2021 per chart Currently on hold in the hospital. Gets injection every 4 months.  6.  Hypotension Patient had brief episodes of hypotension on the night of 2/6. Treated with IV fluids. Currently blood pressure stable.  Monitor.  7.  Facial flushing. Unclear but could be secondary to high-dose steroids. Monitor for now. No evidence of angioedema or any other allergic reaction.  Diet: Cardiac diet DVT Prophylaxis:   enoxaparin (LOVENOX) injection 40 mg Start: 04/09/20 1000 SCDs Start: 04/09/20 3810    Advance goals of care discussion: Full code  Family Communication: no family was present at bedside, at the time of interview.  The pt provided permission to discuss medical plan with the family. Opportunity was given to ask question and all questions were answered satisfactorily.   Disposition:  Status is: Inpatient  Remains inpatient appropriate because: Need for IV antibiotics and severe hypoxia  Dispo: The patient is from: Home              Anticipated d/c is to: Home              Anticipated d/c date is: > 3 days              Patient currently is not medically stable to d/c.   Difficult to place patient   Subjective: Continues to have shortness of breath as well as cough.  No nausea no vomiting.  Minimal oral intake.  No diarrhea no constipation.  Reports facial flushing started since last night  Physical Exam:  General: Appear in moderate distress, bilateral facial erythema, no further rash; Oral Mucosa Clear, moist. no Abnormal Neck Mass Or lumps, Conjunctiva normal  Cardiovascular: S1 and S2 Present, no Murmur, Respiratory:  increased respiratory effort, Bilateral Air entry present and bilateral  Crackles, no wheezes Abdomen: Bowel Sound present, Soft and no tenderness Extremities: no Pedal edema Neurology: alert and oriented to time, place, and person affect appropriate. no new focal deficit Gait not checked due to patient safety concerns  Vitals:   04/09/20 0330 04/09/20 0400 04/09/20 0504 04/09/20 0741  BP: 95/67 (!) 88/63 117/79 105/64  Pulse:  94  77  Resp:  20 (!) 32 (!) 26  Temp:  99 F (37.2 C) 99 F (37.2 C) 98.5 F (36.9 C)  TempSrc:  Axillary Oral Oral  SpO2: 100% 96% 97% 99%  Weight:      Height:        Intake/Output Summary (Last 24 hours) at 04/09/2020 0758 Last data filed at 04/09/2020 0500 Gross per 24 hour  Intake 1350 ml  Output -  Net 1350 ml   Filed Weights   04/08/20 2110  Weight: 68 kg    Data Reviewed: I have personally reviewed and interpreted daily labs, tele strips, imaging. I reviewed all nursing notes, pharmacy notes, vitals, pertinent old records I have discussed plan of care as described above with RN and patient/family.  CBC: Recent Labs  Lab 04/08/20 2149 04/09/20 0548  WBC 8.2 6.8  HGB 13.4 10.7*  HCT 43.5 33.0*  MCV 88.6 87.1  PLT 328 527   Basic Metabolic Panel: Recent Labs  Lab 04/08/20 2149 04/09/20 0548  NA 132* 138  K 3.7 3.7  CL 94* 102  CO2 25 24  GLUCOSE 130* 147*  BUN 18 13  CREATININE 0.60 0.62  CALCIUM 9.9 8.6*    Studies: DG Chest 2 View  Result Date: 04/08/2020 CLINICAL DATA:  Shortness of breath EXAM: CHEST - 2 VIEW COMPARISON:  February 10, 2020 FINDINGS: The heart size and mediastinal contours are within normal limits. Overall shallow degree of aeration with patchy airspace opacities seen throughout both lower lungs. There appears to be slight interval worsening since the prior exam. No pleural effusion. No acute osseous abnormality. IMPRESSION: Slight interval worsening in the patchy airspace opacities in both lower lungs  which could be due to multifocal pneumonia. Electronically Signed   By: Prudencio Pair M.D.   On: 04/08/2020 22:05   CT Angio Chest PE W/Cm &/Or Wo Cm  Result Date: 04/08/2020 CLINICAL DATA:  Increased shortness of breath with exertion. Recently diagnosed with pneumonia. EXAM: CT ANGIOGRAPHY CHEST WITH CONTRAST TECHNIQUE: Multidetector CT imaging of the chest was performed using the standard protocol during bolus administration of intravenous contrast. Multiplanar CT image reconstructions and MIPs were obtained to evaluate the vascular anatomy. CONTRAST:  110mL OMNIPAQUE IOHEXOL 350 MG/ML SOLN COMPARISON:  Chest radiograph April 08, 2020. FINDINGS: Cardiovascular: Satisfactory opacification of the pulmonary arteries to the segmental level. No evidence of pulmonary embolism. Normal heart size. No pericardial effusion. Mediastinum/Nodes: No enlarged mediastinal, hilar, or axillary lymph nodes. Thyroid gland and trachea demonstrate no significant findings. Small hiatal hernia. Lungs/Pleura: Extensive multifocal consolidations and ground-glass opacities most prominent in the lower lobes. Upper Abdomen: No acute abnormality. Musculoskeletal: Mild thoracic spondylosis. Review of the MIP images confirms the above findings. IMPRESSION: 1. Negative  examination for pulmonary embolism. 2. Extensive multifocal consolidations and ground-glass opacities most prominent in the lower lobes, concerning for multifocal pneumonia. However, interstitial lung disease could appear and given patient's history of possible post COVID interstitial lung disease recommend pulmonary consult if not previously performed. Electronically Signed   By: Dahlia Bailiff MD   On: 04/08/2020 23:33    Scheduled Meds: . ascorbic acid  500 mg Oral Daily  . aspirin EC  81 mg Oral Daily  . baricitinib  4 mg Oral Daily  . enoxaparin (LOVENOX) injection  40 mg Subcutaneous Q24H  . feeding supplement  237 mL Oral BID BM  . fluticasone furoate-vilanterol   1 puff Inhalation Daily  . gabapentin  300 mg Oral QHS  . methylPREDNISolone (SOLU-MEDROL) injection  80 mg Intravenous Q12H  . sulfamethoxazole-trimethoprim  1 tablet Oral Once per day on Mon Wed Fri  . zinc sulfate  220 mg Oral Daily   Continuous Infusions: PRN Meds: acetaminophen **OR** acetaminophen, albuterol, ondansetron **OR** ondansetron (ZOFRAN) IV, oxyCODONE  Time spent: 35 minutes  Author: Berle Mull, MD [facility] 04/09/2020 7:58 AM  To reach On-call, see care teams to locate the attending and reach out via [email or web address]. Between 7PM-7AM, please contact night-coverage If you still have difficulty reaching the attending provider, please page the [facility] (Director on Call) for [facility] on amion for assistance.

## 2020-04-10 DIAGNOSIS — D84821 Immunodeficiency due to drugs: Secondary | ICD-10-CM | POA: Diagnosis not present

## 2020-04-10 DIAGNOSIS — U071 COVID-19: Secondary | ICD-10-CM | POA: Diagnosis not present

## 2020-04-10 DIAGNOSIS — J9621 Acute and chronic respiratory failure with hypoxia: Secondary | ICD-10-CM | POA: Diagnosis not present

## 2020-04-10 DIAGNOSIS — Z7952 Long term (current) use of systemic steroids: Secondary | ICD-10-CM

## 2020-04-10 DIAGNOSIS — J9601 Acute respiratory failure with hypoxia: Secondary | ICD-10-CM | POA: Diagnosis not present

## 2020-04-10 DIAGNOSIS — J1282 Pneumonia due to coronavirus disease 2019: Secondary | ICD-10-CM | POA: Diagnosis not present

## 2020-04-10 LAB — QUANTIFERON-TB GOLD PLUS (RQFGPL)
QuantiFERON Mitogen Value: >10 IU/mL
QuantiFERON Nil Value: 0.07 IU/mL
QuantiFERON TB1 Ag Value: 0.08 IU/mL
QuantiFERON TB2 Ag Value: 0.08 IU/mL

## 2020-04-10 LAB — GLUCOSE, CAPILLARY: Glucose-Capillary: 131 mg/dL — ABNORMAL HIGH (ref 70–99)

## 2020-04-10 LAB — QUANTIFERON-TB GOLD PLUS: QuantiFERON-TB Gold Plus: NEGATIVE

## 2020-04-10 LAB — MRSA PCR SCREENING: MRSA by PCR: NEGATIVE

## 2020-04-10 LAB — PROCALCITONIN: Procalcitonin: <0.1 ng/mL

## 2020-04-10 NOTE — Progress Notes (Signed)
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  Presents with complaints of shortness of breath with hypoxia and fever.  Found to have multifocal pneumonia with Covid positive test.   Currently plan is continue current plan and follow-up on pulmonary recommendation.  Assessment and Plan: 1. Acute on chronic hypoxic respiratory failure, POA, 86% on 4 LPM  on admission.  Acute COVID-19 Viral Pneumonia On 4 LPM at home. CXR: hazy bilateral peripheral opacities CT chest: GGO, consolidation, negative for PE CT value for SARS Covid test 24.9.  Oxygen requirement: 3 L HFNC 93% CRP: 13.3 Remdesivir: Started back on 04/09/2020. Steroids: On IV Solu-Medrol 40 twice daily.  Received Solu-Cortef 100 mg on admission Baricitinib/Actemra: Initially started but did not receive the medicine.  Currently holding off until bacterial infection is ruled out.  Antibiotics: Received 1 dose of IV ceftriaxone and azithromycin, procalcitonin negative.  Initiated on IV vancomycin and cefepime.  Check MRSA PCR. DVT Prophylaxis: enoxaparin (LOVENOX) injection 40 mg Start: 04/09/20 1000 SCDs Start: 04/09/20 0514  Prone positioning and incentive spirometer use recommended.  Overall plan: Appreciate pulmonary consultation.  Continue supportive pulmonary care and monitor culture and infection work-up  Isolation duration: First positive test date 04/08/2020  The treatment plan and use of medications and known side effects were discussed with patient/family. It was clearly explained that complete risks and long-term side effects are unknown. Patient/family agree with the treatment plan.    2.  Post Covid pneumonia as well as ILD Appreciate pulmonary  consultation. There has been concern for representing organizing pneumonia for her in the past. Currently continue on IV steroids.  3.  Elevated LFT Improving.  Monitor.  4.  History of breast cancer Currently in remission.  Monitor.  5.  Neuromyelitis optica On rituximab outpatient.  Last therapy on October 2021 per chart Currently on hold in the hospital. Gets injection every 4 months.  6.  Hypotension resolved Patient had brief episodes of hypotension on the night of 2/6. Treated with IV fluids. Currently blood pressure stable.  Monitor.  7.  Facial flushing.  Resolved Unclear but could be secondary to high-dose steroids. Monitor for now. No evidence of angioedema or any other allergic reaction.  Diet: Cardiac diet DVT Prophylaxis:   enoxaparin (LOVENOX) injection 40 mg Start: 04/09/20 1000 SCDs Start: 04/09/20 9562    Advance goals of care discussion: Full code  Family Communication: no family was present at bedside, at the time of interview. Opportunity was given to ask question and all questions were answered satisfactorily.   Disposition:  Status is: Inpatient  Remains inpatient appropriate because: Need for IV antibiotics and severe hypoxia  Dispo: The patient is from: Home              Anticipated d/c is to: Home              Anticipated d/c date is: > 3 days              Patient currently is not medically stable to d/c.   Difficult to place patient   Subjective: No nausea no vomiting or no fever no chills.  Feeling better.  Not back to baseline with regards to her breathing.  Continues to have cough.  Physical Exam:  General: Appear in mild distress, no Rash; Oral Mucosa Clear, moist. no Abnormal Neck Mass Or lumps, Conjunctiva normal  Cardiovascular: S1 and S2 Present, no Murmur, Respiratory: Normal respiratory effort, Bilateral Air entry present and bilateral  Crackles, no wheezes Abdomen: Bowel Sound present, Soft and no tenderness Extremities: trace  Pedal edema Neurology: alert and oriented to time, place, and person affect appropriate. no new focal deficit Gait not checked due to patient safety concerns    Vitals:   04/10/20 0729 04/10/20 1204 04/10/20 1235 04/10/20 1558  BP: (!) 113/57 97/63 106/69 111/71  Pulse: 70 95 85 86  Resp: 20 (!) 25 20 20   Temp: 98.2 F (36.8 C)   98.2 F (36.8 C)  TempSrc: Oral Oral  Oral  SpO2: 96% 96% 93% 92%  Weight:      Height:        Intake/Output Summary (Last 24 hours) at 04/10/2020 1734 Last data filed at 04/10/2020 1500 Gross per 24 hour  Intake 1160.33 ml  Output 1500 ml  Net -339.67 ml   Filed Weights   04/08/20 2110  Weight: 68 kg    Data Reviewed: I have personally reviewed and interpreted daily labs, tele strips, imaging. I reviewed all nursing notes, pharmacy notes, vitals, pertinent old records I have discussed plan of care as described above with RN and patient/family.  CBC: Recent Labs  Lab 04/08/20 2149 04/09/20 0548  WBC 8.2 6.8  HGB 13.4 10.7*  HCT 43.5 33.0*  MCV 88.6 87.1  PLT 328 470   Basic Metabolic Panel: Recent Labs  Lab 04/08/20 2149 04/09/20 0548  NA 132* 138  K 3.7 3.7  CL 94* 102  CO2 25 24  GLUCOSE 130* 147*  BUN 18 13  CREATININE 0.60 0.62  CALCIUM 9.9 8.6*    Studies: No results found.  Scheduled Meds: . ascorbic acid  500 mg Oral Daily  . aspirin EC  81 mg Oral Daily  . enoxaparin (LOVENOX) injection  40 mg Subcutaneous Q24H  . feeding supplement  237 mL Oral TID BM  . fluticasone furoate-vilanterol  1 puff Inhalation Daily  . gabapentin  300 mg Oral QHS  . methylPREDNISolone (SOLU-MEDROL) injection  80 mg Intravenous Q12H  . sulfamethoxazole-trimethoprim  1 tablet Oral Once per day on Mon Wed Fri  . zinc sulfate  220 mg Oral Daily   Continuous Infusions: . ceFEPime (MAXIPIME) IV 2 g (04/10/20 1429)  . remdesivir 100 mg in NS 100 mL 100 mg (04/10/20 0854)   PRN Meds: acetaminophen **OR** acetaminophen, albuterol,  diphenhydrAMINE, ondansetron **OR** ondansetron (ZOFRAN) IV, oxyCODONE  Time spent: 35 minutes  Author: Berle Mull, MD [facility] 04/10/2020 5:34 PM  To reach On-call, see care teams to locate the attending and reach out via [email or web address]. Between 7PM-7AM, please contact night-coverage If you still have difficulty reaching the attending provider, please page the [facility] (Director on Call) for [facility] on amion for assistance.

## 2020-04-10 NOTE — Consult Note (Signed)
NAME:  Grace Moyer, MRN:  [id number], DOB:  Aug 19, 1960, LOS: 1 ADMISSION DATE:  04/08/2020, CONSULTATION DATE:  04/09/20 REFERRING MD:  Posey Pronto - TR[facility], CHIEF COMPLAINT:  Hypoxia, SOB  Brief History:  60 yo F with COVID who follows with Dr. Vaughan Browner for possible cryptogenic PNA vs ILD, admitted to [facility] for hypoxia and subsequent COVID-19 infection  History of Present Illness:  60 yo F PMH breast cancer, NMO on rituxan, previous provoked PE (2018, s/p 38mo Xarelto), GERD obesity, prior COVID-19 infections (01/2019, summer 2021-asymptomatic, bacterial pneumonia in 01/2020 treated with antibiotics, hypoxia on home O2 who presented to [facility] 04/08/20 with CC SOB superimposed on chronic cough. Sx began 2-3 days ago on exertion, but now is occurring with minimal exertion and with repositioning. Associated fevers to 102 degrees F and hypoxia SpO2 60s. CTA chest obtained, no evidence of PE. Got 1x azithro and rocephin before COVID returned positive. No known exposures. Admitted to [facility] 2/7, started on COVID therapies. Vaccinated x 2, has not been boosted.  Notably, Pt is followed outpatient by [facility] Dr. Vaughan Browner for possible cryptogenic organizing PNA vs ILD. Pt takes prednisone and Bactrim prophylaxis for this.  Admitted to [facility] On admission continued on bactrim, started on solumedrol, baricitinib, and remdesivir.   Past Medical History:  NMO Immunosuppression (on rituximab for NMO) Covid infections Chronic hypoxic respiratory failure PE GERD  Breast cancer s/p radiation to left breast in 2018  Significant Hospital Events:  2/6 presents to ED with hypoxia SOB 2/7 admitted to [facility] Third COVID infection. Pulm consulted  Consults:  PCCM  Procedures:    Significant Diagnostic Tests:  2/7 CTA chest> no evidence of PE. Extensive GGO bilaterally, especially in lower lobes  Micro Data:  2/7 COVID +  2/7 BCx 2/8 MRSA >  Antimicrobials:  2/6 azithro, rocephin PTA Bactrim >>> 2/7 vanc>> 2/7 cefepime  >>  Interim History / Subjective:  Weaned from [facility] to [facility]   PCT <0.10 WBC 6.8 LA 1.1 LDH 254, Ferritin 875, CRP 13.3 Fibrinogen >800   Patient spoke a bit about difficulty coping with autoimmune dz, and navigating this amidst pandemic. Pt feels like she has lost the ability to engage in hobbies she previously enjoyed. Objective   Blood pressure (!) 113/57, pulse 70, temperature 98.2 F (36.8 C), temperature source Oral, resp. rate 20, height 5\' 3"  (1.6 m), weight 68 kg, SpO2 96 %.        Intake/Output Summary (Last 24 hours) at 04/10/2020 1148 Last data filed at 04/10/2020 0843 Gross per 24 hour  Intake 1080.33 ml  Output 1500 ml  Net -419.67 ml   Filed Weights   04/08/20 2110  Weight: 68 kg    Examination: General: wdwn chronically ill appearing middle aged F, reclined in bed NAD HENT: NCAT. Slightly flushed cheeks, anicteric sclera Lungs: Symmetrical chest expansion, no accessory use. Diminished bibasilar sounds Cardiovascular: rrr s1s2 cap refill brisk Abdomen: soft round ndnt + bowel sounds Extremities: no edema, cyanosis, clubbing Neuro: AAOx4 following commands Derm: c/d/w Psych: appropriate for age and situation, cooperative  Resolved Hospital Problem list     Assessment & Plan:    Acute on chronic hypoxic respiratory failure (home 1-3LNC) Acute Covid-19 PNA Post-Covid or post-bacterial inflammatory PNA -- cryptogenic organizing PNA vs other ILD -followed by Dr. Vaughan Browner for this as an outpatient, on high dose prednisone PTA -Her chronic symptoms had been improving on steroids as an outpatient since they were increased a few weeks ago. She is at risk  for respiratory decompensation associated with sedation for bronchoscopy and there is likely an alternative explanation (covid). Of concern, she is always at risk of OIs with her chronic immunosuppression. Plan: -cont airborne/contact -trend inflammatory biomarkers  -baricitinib, remdesivir, steroids -supplemental  O2, Is, mobility, self- prone -cont ppx Bactrim -cont empiric vanc / cefepime -checking MRSA PCR, if neg could consider dc vanc -- will discuss with PCCM attending  -send and follow sputum cx if pt can expectorate  -Breo, albuterol  -no role for bronch at this time -No role for bronchoscopy at this time.   NMO -on OP rituximab, follows with [facility] for this -seems like pt might need some OP support for coping with chronic disease (Psychology, support groups, etc) -cont to offer emotional support inpatient   Hx PE - 2018, provoked PE s/p xarelto Hx L Breast Cancer, in remission     Best practice (evaluated daily)  Diet: reg Pain/Anxiety/Delirium protocol (if indicated): na VAP protocol (if indicated): na DVT prophylaxis: lovenox  GI prophylaxis: na Glucose control: monitor  Mobility: Encourage  Disposition:SDU   Goals of Care:  Last date of multidisciplinary goals of care discussion:per primary Family and staff present:  Summary of discussion:  Follow up goals of care discussion due:  Code Status: Full   Labs   CBC: Recent Labs  Lab 04/08/20 2149 04/09/20 0548  WBC 8.2 6.8  HGB 13.4 10.7*  HCT 43.5 33.0*  MCV 88.6 87.1  PLT 328 956    Basic Metabolic Panel: Recent Labs  Lab 04/08/20 2149 04/09/20 0548  NA 132* 138  K 3.7 3.7  CL 94* 102  CO2 25 24  GLUCOSE 130* 147*  BUN 18 13  CREATININE 0.60 0.62  CALCIUM 9.9 8.6*   GFR: Estimated Creatinine Clearance: 70.9 mL/min (by C-G formula based on SCr of 0.62 mg/dL). Recent Labs  Lab 04/08/20 2149 04/08/20 2301 04/09/20 0548 04/10/20 0055  PROCALCITON  --   --  <0.10 <0.10  WBC 8.2  --  6.8  --   LATICACIDVEN  --  2.5* 1.1  --     Liver Function Tests: Recent Labs  Lab 04/08/20 2149 04/09/20 0548  AST 47* 24  ALT 70* 46*  ALKPHOS 298* 216*  BILITOT 0.7 0.6  PROT 8.3* 5.7*  ALBUMIN 3.9 2.7*   No results for input(s): LIPASE, AMYLASE in the last 168 hours. No results for input(s): AMMONIA in  the last 168 hours.  ABG    Component Value Date/Time   HCO3 25.6 04/09/2020 0548   TCO2 25 08/15/2016 1234   O2SAT 73.9 04/09/2020 0548     Coagulation Profile: No results for input(s): INR, PROTIME in the last 168 hours.  Cardiac Enzymes: No results for input(s): CKTOTAL, CKMB, CKMBINDEX, TROPONINI in the last 168 hours.  HbA1C: Hgb A1c MFr Bld  Date/Time Value Ref Range Status  02/10/2019 08:41 AM 6.1 (H) 4.8 - 5.6 % Final    Comment:    (NOTE) Pre diabetes:          5.7%-6.4% Diabetes:              >6.4% Glycemic control for   <7.0% adults with diabetes   08/15/2016 06:57 PM 4.6 (L) 4.8 - 5.6 % Final    Comment:    (NOTE)         Pre-diabetes: 5.7 - 6.4         Diabetes: >6.4         Glycemic control for adults with  diabetes: <7.0     CBG: Recent Labs  Lab 04/10/20 [place] MSN, AGACNP-BC [place] for pager details 04/10/2020, 3:46 PM

## 2020-04-11 LAB — CBC
HCT: 34 % — ABNORMAL LOW (ref 36.0–46.0)
Hemoglobin: 11.2 g/dL — ABNORMAL LOW (ref 12.0–15.0)
MCH: 28.5 pg (ref 26.0–34.0)
MCHC: 32.9 g/dL (ref 30.0–36.0)
MCV: 86.5 fL (ref 80.0–100.0)
Platelets: 274 10*3/uL (ref 150–400)
RBC: 3.93 MIL/uL (ref 3.87–5.11)
RDW: 16.5 % — ABNORMAL HIGH (ref 11.5–15.5)
WBC: 7.1 10*3/uL (ref 4.0–10.5)
nRBC: 0 % (ref 0.0–0.2)

## 2020-04-11 LAB — COMPREHENSIVE METABOLIC PANEL
ALT: 35 U/L (ref 0–44)
AST: 23 U/L (ref 15–41)
Albumin: 2.9 g/dL — ABNORMAL LOW (ref 3.5–5.0)
Alkaline Phosphatase: 201 U/L — ABNORMAL HIGH (ref 38–126)
Anion gap: 14 (ref 5–15)
BUN: 27 mg/dL — ABNORMAL HIGH (ref 6–20)
CO2: 22 mmol/L (ref 22–32)
Calcium: 9.1 mg/dL (ref 8.9–10.3)
Chloride: 98 mmol/L (ref 98–111)
Creatinine, Ser: 0.71 mg/dL (ref 0.44–1.00)
GFR, Estimated: >60 mL/min (ref >60)
Glucose, Bld: 290 mg/dL — ABNORMAL HIGH (ref 70–99)
Potassium: 4.8 mmol/L (ref 3.5–5.1)
Sodium: 134 mmol/L — ABNORMAL LOW (ref 135–145)
Total Bilirubin: 0.6 mg/dL (ref 0.3–1.2)
Total Protein: 5.9 g/dL — ABNORMAL LOW (ref 6.5–8.1)

## 2020-04-11 LAB — D-DIMER, QUANTITATIVE: D-Dimer, Quant: 0.38 ug/mL-FEU (ref 0.00–0.50)

## 2020-04-11 LAB — PROCALCITONIN: Procalcitonin: <0.1 ng/mL

## 2020-04-11 LAB — MAGNESIUM: Magnesium: 2.2 mg/dL (ref 1.7–2.4)

## 2020-04-11 LAB — C-REACTIVE PROTEIN: CRP: 2.1 mg/dL — ABNORMAL HIGH (ref <1.0)

## 2020-04-11 MED ORDER — PREDNISONE 10 MG PO TABS: ORAL_TABLET | ORAL | 0 refills | Status: DC

## 2020-04-11 MED ORDER — ENSURE ENLIVE PO LIQD: 237.0000 mL | Freq: Three times a day (TID) | ORAL | 0 refills | Status: DC

## 2020-04-11 MED ORDER — CEFDINIR 300 MG PO CAPS: 300.0000 mg | ORAL_CAPSULE | Freq: Two times a day (BID) | ORAL | 0 refills | Status: AC

## 2020-04-11 NOTE — Plan of Care (Signed)
Patient is resting in bed. VSS. currently on 2.5L. no complaints overnight. [place]wick in place. Call bell within reach.   Problem: Education: Goal: Knowledge of risk factors and measures for prevention of condition will improve Outcome: Progressing   Problem: Coping: Goal: Psychosocial and spiritual needs will be supported Outcome: Progressing   Problem: Respiratory: Goal: Will maintain a patent airway Outcome: Progressing Goal: Complications related to the disease process, condition or treatment will be avoided or minimized Outcome: Progressing

## 2020-04-11 NOTE — Discharge Planning (Signed)
Nsg Discharge Note  Admit Date:  04/08/2020 Discharge date: 04/11/2020   Grace Moyer to be D/C'd Home per MD order.  AVS completed.    Discharge Medication: Allergies as of 04/11/2020      Reactions   Hydrocodone Other (See Comments)   Did not help with pain and kept her up all night      Medication List    TAKE these medications   acetaminophen 500 MG tablet Commonly known as: TYLENOL Take 1,000 mg by mouth every 6 (six) hours as needed for fever or mild pain.   albuterol 108 (90 Base) MCG/ACT inhaler Commonly known as: VENTOLIN HFA Inhale 2 puffs into the lungs every 4 (four) hours as needed for wheezing or shortness of breath.   ascorbic acid 500 MG tablet Commonly known as: VITAMIN C Take 1 tablet (500 mg total) by mouth daily.   aspirin 81 MG tablet Take 1 tablet (81 mg total) by mouth daily.   benzonatate 200 MG capsule Commonly known as: TESSALON Take 1 capsule (200 mg total) by mouth in the morning, at noon, and at bedtime.   Breo Ellipta 200-25 MCG/INH Aepb Generic drug: fluticasone furoate-vilanterol Inhale 1 puff into the lungs daily.   cefdinir 300 MG capsule Commonly known as: OMNICEF Take 1 capsule (300 mg total) by mouth 2 (two) times daily for 5 days.   Cholecalciferol 25 MCG (1000 UT) tablet Take 5,000 Units by mouth daily.   feeding supplement Liqd Take 237 mLs by mouth 3 (three) times daily between meals.   gabapentin 300 MG capsule Commonly known as: NEURONTIN Take 300 mg by mouth at bedtime.   guaiFENesin 600 MG 12 hr tablet Commonly known as: MUCINEX Take 600 mg by mouth 2 (two) times daily as needed for cough or to loosen phlegm.   Melatonin 10 MG Tabs Take 20 mg by mouth at bedtime.   predniSONE 10 MG tablet Commonly known as: DELTASONE Take 60 mg daily for 7 days,Take 50mg  daily for 7 days,Take 40mg  daily till seen in pulmonary clinic. What changed:   how much to take  how to take this  when to take this  additional  instructions   sulfamethoxazole-trimethoprim 800-160 MG tablet Commonly known as: BACTRIM DS Take 1 tablet by mouth 3 (three) times a week. What changed: additional instructions   zinc sulfate 220 (50 Zn) MG capsule Take 1 capsule (220 mg total) by mouth daily. What changed: how much to take            Durable Medical Equipment  (From admission, onward)         Start     Ordered   04/11/20 1146  For home use only DME oxygen  Once       Question Answer Comment  Length of Need Lifetime   Mode or (Route) Nasal cannula   Liters per Minute 4   Frequency Continuous (stationary and portable oxygen unit needed)   Oxygen delivery system Gas      04/11/20 1145          Discharge Assessment: Vitals:   04/11/20 0732 04/11/20 1155  BP: 98/69 105/61  Pulse: 61 76  Resp: 19 20  Temp: 98 F (36.7 C) 98.1 F (36.7 C)  SpO2: 97%    Skin clean, dry and intact without evidence of skin break down, no evidence of skin tears noted. IV catheter discontinued intact. Site without signs and symptoms of complications - no redness or edema noted at  insertion site, patient denies c/o pain - only slight tenderness at site.  Dressing with slight pressure applied.  D/c Instructions-Education: Discharge instructions given to patient/family with verbalized understanding. D/c education completed with patient/family including follow up instructions, medication list, d/c activities limitations if indicated, with other d/c instructions as indicated by MD - patient able to verbalize understanding, all questions fully answered. Patient instructed to return to ED, call 911, or call MD for any changes in condition.  Patient escorted via [place], and D/C home via private auto.  Hiram Comber, RN 04/11/2020 15:13 PM

## 2020-04-11 NOTE — Care Management (Signed)
Spoke w patient, she has all needed home oxygen at home, verified her home concentrator goes up to 6L.  Faxed order for new flow rate for home oxygen to [facility].  No other CM needs identified.

## 2020-04-11 NOTE — Progress Notes (Signed)
SATURATION QUALIFICATIONS: (This note is used to comply with regulatory documentation for home oxygen)  Patient Saturations on Room Air at Rest = 84%  Patient Saturations on Room Air while Ambulating = 79%  Patient Saturations on 4 Liters of oxygen while Ambulating = 90%

## 2020-04-12 DIAGNOSIS — J9611 Chronic respiratory failure with hypoxia: Secondary | ICD-10-CM | POA: Diagnosis not present

## 2020-04-12 DIAGNOSIS — Z8616 Personal history of COVID-19: Secondary | ICD-10-CM | POA: Diagnosis not present

## 2020-04-13 DIAGNOSIS — J9601 Acute respiratory failure with hypoxia: Secondary | ICD-10-CM | POA: Diagnosis not present

## 2020-04-13 DIAGNOSIS — R0603 Acute respiratory distress: Secondary | ICD-10-CM | POA: Diagnosis not present

## 2020-04-13 DIAGNOSIS — U071 COVID-19: Secondary | ICD-10-CM | POA: Diagnosis not present

## 2020-04-13 DIAGNOSIS — R000 Tachycardia, unspecified: Secondary | ICD-10-CM | POA: Diagnosis not present

## 2020-04-13 LAB — CULTURE, BLOOD (ROUTINE X 2)
Culture: NO GROWTH
Culture: NO GROWTH

## 2020-04-13 NOTE — Discharge Summary (Signed)
[facility] Discharge Summary   Patient: Grace Moyer DDU:[id number]  PCP: Sharilyn Sites, MD  Date of admission: 04/08/2020   Date of discharge: 04/11/2020      Discharge Diagnoses:  Principal Problem:   Acute respiratory failure with hypoxia ([place]) Active Problems:   Lactic acidosis   Pneumonia due to COVID-19 virus  Admitted From: Home Disposition:  Home   Recommendations for Outpatient Follow-up:  1. PCP: Follow-up with PCP and pulmonary as recommended 2. Follow up LABS/TEST: None   Follow-up Information    Sharilyn Sites, MD. Schedule an appointment as soon as possible for a visit in 1 week(s).   Specialty: Family Medicine Contact information: [address] [place] [address] [phone]        Marshell Garfinkel, MD. Schedule an appointment as soon as possible for a visit in 1 month(s).   Specialty: Pulmonary Disease Contact information: [address] [facility] [address] [phone]              Discharge Instructions    Diet - low sodium heart healthy   Complete by: As directed    Increase activity slowly   Complete by: As directed       Diet recommendation: Cardiac diet  Activity: The patient is advised to gradually reintroduce usual activities, as tolerated  Discharge Condition: stable  Code Status: Full code   History of present illness: As per the H and P dictated on admission, "Grace Moyer  is a 60 y.o. 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  Patient reports that she has been on oxygen for a couple of months now, wearing 1 L at rest and 3-4 L with exertion at home.  Today at rest she was feeling short of breath, and at rest she was requiring 4 L to maintain her oxygen saturations.  With any minimal exertion, even sitting up from a laying down position her oxygen saturations would drop into the 70s.  She  did see an oxygen saturation as low as 63%.  She had associated chest tightness, dizziness, fatigue.  She reports intermittent fevers over the past few days with a T-max of 102.  She reports associated cough that has clear phlegm.  She does not have palpitations.  Patient has been undergoing extensive pulmonary work-up with Dr. Vaughan Browner.  Last note he explains the patient has had COVID twice.  Last time she was treated with 4 days of remdesivir and a course of steroids.  She then had post viral pneumonia and was treated with ceftriaxone and azithromycin followed by doxycycline.  She improved but she continued to have cough and she had a 30 pound weight loss since November 2021.  She has started her on prednisone 20 mg daily.  Pulmonology note reports that patient is more susceptible to infection because of the rituximab.  He was questioning cryptogenic organizing pneumonia.  He started her on Bactrim double strength three times a week, and reports that she may need bronchoscopy.  Even his history we have elected to admit patient to [facility] where she can be seen by her pulmonology group.  Full code.  She is fully vaccinated for COVID but has not had her booster, this is her third Covid infection.  Patient is a former smoker who quit 18 years ago, she drinks wine twice a year, she does not use illicit drugs. [facility]  Course:  Summary of her active problems in the hospital is as following.   1. Acute on chronic hypoxic respiratory failure, POA, 86% on 4 LPM  on admission.  Acute COVID-19 Viral Pneumonia On 4 LPM at home. CXR: hazy bilateral peripheral opacities CT chest: GGO, consolidation, negative for PE Cycle threshold value for SARS Covid test 24.9. Oxygen requirement: 4 L HFNC 93% CRP: 13.3-2.1 Remdesivir:  Received 3 days of treatment Steroids: On IV Solu-Medrol 40 twice daily.  Received Solu-Cortef 100 mg on admission.  Currently on prednisone taper on  discharge. Baricitinib/Actemra: Initially started but did not receive the medicine.  Due to concern of bacterial infection Antibiotics: Received 1 dose of IV ceftriaxone and azithromycin, procalcitonin negative.  Initiated on IV vancomycin and cefepime.  MRSA PCR negative.  Discharge on oral antibiotic. Prone positioning and incentive spirometer use recommended.  Overall plan: Appreciate pulmonary consultation.    Currently stable on home oxygen.  Outpatient follow-up with pulmonary.  Isolation duration: First positive test date 04/08/2020 will need 10-day isolation.  The treatment plan and use of medications and known side effects were discussed with patient/family. It was clearly explained that complete risks and long-term side effects are unknown. Patient/family agree with the treatment plan.    2.  Post Covid pneumonia as well as ILD Appreciate pulmonary consultation. There has been concern for representing organizing pneumonia for her in the past.  3.  Elevated LFT Improving.  Monitor.  4.  History of breast cancer Currently in remission.  Monitor.  5.  Neuromyelitis optica On rituximab outpatient.  Last therapy on October 2021 per chart Currently on hold in the hospital. Gets injection every 4 months.  6.  Hypotension resolved Patient had brief episodes of hypotension on the night of 2/6. Treated with IV fluids. Currently blood pressure stable.  Monitor.  7.  Facial flushing.  Resolved Unclear but could be secondary to high-dose steroids. Monitor for now. No evidence of angioedema or any other allergic reaction.  Patient was ambulatory without any assistance. On the day of the discharge the patient's vitals were stable, and no other acute medical condition were reported by patient. The patient was felt safe to be discharge at Home with no therapy needed on discharge.  Consultants: PCCM  Procedures: none  DISCHARGE MEDICATION: Allergies as of 04/11/2020       Reactions   Hydrocodone Other (See Comments)   Did not help with pain and kept her up all night      Medication List    TAKE these medications   acetaminophen 500 MG tablet Commonly known as: TYLENOL Take 1,000 mg by mouth every 6 (six) hours as needed for fever or mild pain.   albuterol 108 (90 Base) MCG/ACT inhaler Commonly known as: VENTOLIN HFA Inhale 2 puffs into the lungs every 4 (four) hours as needed for wheezing or shortness of breath.   ascorbic acid 500 MG tablet Commonly known as: VITAMIN C Take 1 tablet (500 mg total) by mouth daily.   aspirin 81 MG tablet Take 1 tablet (81 mg total) by mouth daily.   benzonatate 200 MG capsule Commonly known as: TESSALON Take 1 capsule (200 mg total) by mouth in the morning, at noon, and at bedtime.   Breo Ellipta 200-25 MCG/INH Aepb Generic drug: fluticasone furoate-vilanterol Inhale 1 puff into the lungs daily.   cefdinir 300 MG capsule Commonly known as: OMNICEF Take 1 capsule (300 mg total) by mouth 2 (two) times daily for 5 days.  Cholecalciferol 25 MCG (1000 UT) tablet Take 5,000 Units by mouth daily.   feeding supplement Liqd Take 237 mLs by mouth 3 (three) times daily between meals.   gabapentin 300 MG capsule Commonly known as: NEURONTIN Take 300 mg by mouth at bedtime.   guaiFENesin 600 MG 12 hr tablet Commonly known as: MUCINEX Take 600 mg by mouth 2 (two) times daily as needed for cough or to loosen phlegm.   Melatonin 10 MG Tabs Take 20 mg by mouth at bedtime.   predniSONE 10 MG tablet Commonly known as: DELTASONE Take 60 mg daily for 7 days,Take 50mg  daily for 7 days,Take 40mg  daily till seen in pulmonary clinic. What changed:   how much to take  how to take this  when to take this  additional instructions   sulfamethoxazole-trimethoprim 800-160 MG tablet Commonly known as: BACTRIM DS Take 1 tablet by mouth 3 (three) times a week. What changed: additional instructions   zinc sulfate  220 (50 Zn) MG capsule Take 1 capsule (220 mg total) by mouth daily. What changed: how much to take       Discharge Exam: Filed Weights   04/08/20 2110  Weight: 68 kg   Vitals:   04/11/20 0732 04/11/20 1155  BP: 98/69 105/61  Pulse: 61 76  Resp: 19 20  Temp: 98 F (36.7 C) 98.1 F (36.7 C)  SpO2: 97%    General: Appear in no distress, no Rash; Oral Mucosa Clear, moist. no Abnormal Neck Mass Or lumps, Conjunctiva normal  Cardiovascular: S1 and S2 Present, no Murmur Respiratory: good respiratory effort, Bilateral Air entry present and bilateral  Crackles, no wheezes Abdomen: Bowel Sound present, Soft and no tenderness Extremities: no Pedal edema Neurology: alert and oriented to time, place, and person affect appropriate. no new focal deficit  The results of significant diagnostics from this hospitalization (including imaging, microbiology, ancillary and laboratory) are listed below for reference.    Significant Diagnostic Studies: DG Chest 2 View  Result Date: 04/08/2020 CLINICAL DATA:  Shortness of breath EXAM: CHEST - 2 VIEW COMPARISON:  February 10, 2020 FINDINGS: The heart size and mediastinal contours are within normal limits. Overall shallow degree of aeration with patchy airspace opacities seen throughout both lower lungs. There appears to be slight interval worsening since the prior exam. No pleural effusion. No acute osseous abnormality. IMPRESSION: Slight interval worsening in the patchy airspace opacities in both lower lungs which could be due to multifocal pneumonia. Electronically Signed   By: Prudencio Pair M.D.   On: 04/08/2020 22:05   CT Angio Chest PE W/Cm &/Or Wo Cm  Result Date: 04/08/2020 CLINICAL DATA:  Increased shortness of breath with exertion. Recently diagnosed with pneumonia. EXAM: CT ANGIOGRAPHY CHEST WITH CONTRAST TECHNIQUE: Multidetector CT imaging of the chest was performed using the standard protocol during bolus administration of intravenous contrast.  Multiplanar CT image reconstructions and MIPs were obtained to evaluate the vascular anatomy. CONTRAST:  174mL OMNIPAQUE IOHEXOL 350 MG/ML SOLN COMPARISON:  Chest radiograph April 08, 2020. FINDINGS: Cardiovascular: Satisfactory opacification of the pulmonary arteries to the segmental level. No evidence of pulmonary embolism. Normal heart size. No pericardial effusion. Mediastinum/Nodes: No enlarged mediastinal, hilar, or axillary lymph nodes. Thyroid gland and trachea demonstrate no significant findings. Small hiatal hernia. Lungs/Pleura: Extensive multifocal consolidations and ground-glass opacities most prominent in the lower lobes. Upper Abdomen: No acute abnormality. Musculoskeletal: Mild thoracic spondylosis. Review of the MIP images confirms the above findings. IMPRESSION: 1. Negative examination for pulmonary embolism. 2.  Extensive multifocal consolidations and ground-glass opacities most prominent in the lower lobes, concerning for multifocal pneumonia. However, interstitial lung disease could appear and given patient's history of possible post COVID interstitial lung disease recommend pulmonary consult if not previously performed. Electronically Signed   By: Dahlia Bailiff MD   On: 04/08/2020 23:33    Microbiology: Recent Results (from the past 240 hour(s))  Blood culture (routine x 2)     Status: None   Collection Time: 04/08/20 11:02 PM   Specimen: BLOOD  Result Value Ref Range Status   Specimen Description BLOOD BLOOD RIGHT HAND  Final   Special Requests   Final    BOTTLES DRAWN AEROBIC AND ANAEROBIC Blood Culture results may not be optimal due to an inadequate volume of blood received in culture bottles   Culture   Final    NO GROWTH 5 DAYS Performed at [facility], [address], [place], [place] [address]    Report Status 04/13/2020 FINAL  Final  Blood culture (routine x 2)     Status: None   Collection Time: 04/08/20 11:03 PM   Specimen: BLOOD  Result Value Ref Range Status    Specimen Description BLOOD BLOOD RIGHT FOREARM  Final   Special Requests   Final    BOTTLES DRAWN AEROBIC AND ANAEROBIC Blood Culture results may not be optimal due to an inadequate volume of blood received in culture bottles   Culture   Final    NO GROWTH 5 DAYS Performed at [facility], [address], [place], [place] [address]    Report Status 04/13/2020 FINAL  Final  SARS Coronavirus 2 by RT PCR (hospital order, performed in [place] hospital lab) Nasopharyngeal Nasopharyngeal Swab     Status: Abnormal   Collection Time: 04/08/20 11:05 PM   Specimen: Nasopharyngeal Swab  Result Value Ref Range Status   SARS Coronavirus 2 POSITIVE (A) NEGATIVE Final    Comment: RESULT CALLED TO, READ BACK BY AND VERIFIED WITH: WALKER,T@0033  BYMATTHEWS,B 2.7.22 (NOTE) SARS-CoV-2 target nucleic acids are DETECTED  SARS-CoV-2 RNA is generally detectable in upper respiratory specimens  during the acute phase of infection.  Positive results are indicative  of the presence of the identified virus, but do not rule out bacterial infection or co-infection with other pathogens not detected by the test.  Clinical correlation with patient history and  other diagnostic information is necessary to determine patient infection status.  The expected result is negative.  Fact Sheet for Patients:   StrictlyIdeas.no   Fact Sheet for Healthcare Providers:   BankingDealers.co.za    This test is not yet approved or cleared by the [place] FDA and  has been authorized for detection and/or diagnosis of SARS-CoV-2 by FDA under an Emergency Use Authorization (EUA).  This EUA will remain in effect (meaning this tes t can be used) for the duration of  the COVID-19 declaration under Section 564(b)(1) of the Act, 21 U.S.C. section 360-bbb-3(b)(1), unless the authorization is terminated or revoked sooner.  Performed at [facility], [address], [place], [place]  [address]   MRSA PCR Screening     Status: None   Collection Time: 04/10/20  5:44 PM   Specimen: Nasopharyngeal  Result Value Ref Range Status   MRSA by PCR NEGATIVE NEGATIVE Final    Comment:        The GeneXpert MRSA Assay (FDA approved for NASAL specimens only), is one component of a comprehensive MRSA colonization surveillance program. It is not intended to  diagnose MRSA infection nor to guide or monitor treatment for MRSA infections. Performed at [facility], [place] [address], [place], [place] [address]      Labs: CBC: Recent Labs  Lab 04/08/20 2149 04/09/20 0548 04/11/20 0103  WBC 8.2 6.8 7.1  HGB 13.4 10.7* 11.2*  HCT 43.5 33.0* 34.0*  MCV 88.6 87.1 86.5  PLT 328 255 102   Basic Metabolic Panel: Recent Labs  Lab 04/08/20 2149 04/09/20 0548 04/11/20 0103  NA 132* 138 134*  K 3.7 3.7 4.8  CL 94* 102 98  CO2 25 24 22   GLUCOSE 130* 147* 290*  BUN 18 13 27*  CREATININE 0.60 0.62 0.71  CALCIUM 9.9 8.6* 9.1  MG  --   --  2.2   Liver Function Tests: Recent Labs  Lab 04/08/20 2149 04/09/20 0548 04/11/20 0103  AST 47* 24 23  ALT 70* 46* 35  ALKPHOS 298* 216* 201*  BILITOT 0.7 0.6 0.6  PROT 8.3* 5.7* 5.9*  ALBUMIN 3.9 2.7* 2.9*   CBG: Recent Labs  Lab 04/10/20 0757  GLUCAP 131*    Time spent: 35 minutes  Signed:  Berle Mull  [facility] 04/11/2020 5:40 PM

## 2020-04-18 DIAGNOSIS — U071 COVID-19: Secondary | ICD-10-CM | POA: Diagnosis not present

## 2020-04-19 ENCOUNTER — Telehealth: Payer: Self-pay | Admitting: Pulmonary Disease

## 2020-04-19 MED ORDER — LEVOFLOXACIN 750 MG PO TABS: 750.0000 mg | ORAL_TABLET | Freq: Every day | ORAL | 0 refills | Status: DC

## 2020-04-19 NOTE — Telephone Encounter (Signed)
Called and spoke with pt who states she developed a temp 2 days ago. Pt has been taking tylenol which will help the temp go down but then once the tylenol has worn off, her temp will come back. Pt said the highest temp has been 100.4  Pt also stated that when she is ambulating, her O2 sats will drop to around 76% but when she is sitting still her sats will be 90-95% and all this is with her oxygen on. Pt states that she usually wears 2L O2 but recently has had to bump herself up to 3-3.5L.  While speaking with pt, she sounded winded on the phone while talking with her and she said that she has just been anxious due to her symptoms.  Stated to pt that she should probably go to the ED to be evaluated due to her being SOB and with her O2 sats dropping to the 70s with her being on the O2. Pt stated that she would rather not have to go to the ED if she does not have to. Pt is requesting to have meds sent to pharmacy for her to see if this would help.  Dr. Vaughan Browner, please advise.

## 2020-04-19 NOTE — Telephone Encounter (Signed)
I called and discussed with patient. I have asked her to increase pred to 60mg /day (30mg  bid) Please send in a prescription for levofloxacin 750 mg daily for 7 days  If she worsens then will need to get admitted.

## 2020-04-19 NOTE — Telephone Encounter (Signed)
Called and spoke with pt to verify what pharmacy we needed to send abx to. Rx for levofloxacin has been sent to preferred pharmacy for pt. Nothing further needed.

## 2020-04-21 ENCOUNTER — Telehealth: Payer: Self-pay | Admitting: Pulmonary Disease

## 2020-04-21 NOTE — Telephone Encounter (Signed)
Patient dropped off forms - office paperwork was given to patient by front desk, patient is aware of turn around time and fee. I will prepare and give to Dr. Vaughan Browner -pr

## 2020-04-23 DIAGNOSIS — U071 COVID-19: Secondary | ICD-10-CM | POA: Diagnosis not present

## 2020-04-23 NOTE — Telephone Encounter (Signed)
Prepared forms - will give to Dr. Vaughan Browner on 04/24/2020 -pr

## 2020-04-25 ENCOUNTER — Ambulatory Visit (HOSPITAL_COMMUNITY): Payer: BC Managed Care – PPO

## 2020-04-25 DIAGNOSIS — Z0289 Encounter for other administrative examinations: Secondary | ICD-10-CM

## 2020-04-25 NOTE — Telephone Encounter (Signed)
Rec'd forms back - sent email to Kathlee Nations to drop charge -pr

## 2020-04-27 NOTE — Telephone Encounter (Signed)
Called and spoke to patient advised forms are complete and fee ready to be collected. Patient states she will pick up forms and pay fee on next visit 05/07/2020. Forms placed up front in filing cabinet.-pr

## 2020-05-04 DIAGNOSIS — Z79899 Other long term (current) drug therapy: Secondary | ICD-10-CM | POA: Diagnosis not present

## 2020-05-07 ENCOUNTER — Encounter: Payer: Self-pay | Admitting: Pulmonary Disease

## 2020-05-07 ENCOUNTER — Other Ambulatory Visit: Payer: Self-pay

## 2020-05-07 ENCOUNTER — Ambulatory Visit: Payer: BC Managed Care – PPO | Admitting: Pulmonary Disease

## 2020-05-07 VITALS — BP 108/68 | HR 119 | Temp 97.9°F | Ht 63.0 in | Wt 150.8 lb

## 2020-05-07 DIAGNOSIS — Z8616 Personal history of COVID-19: Secondary | ICD-10-CM

## 2020-05-07 DIAGNOSIS — J849 Interstitial pulmonary disease, unspecified: Secondary | ICD-10-CM

## 2020-05-07 DIAGNOSIS — J9611 Chronic respiratory failure with hypoxia: Secondary | ICD-10-CM | POA: Diagnosis not present

## 2020-05-07 DIAGNOSIS — J84116 Cryptogenic organizing pneumonia: Secondary | ICD-10-CM

## 2020-05-07 LAB — CBC
HCT: 39.4 % (ref 36.0–46.0)
Hemoglobin: 13.1 g/dL (ref 12.0–15.0)
MCHC: 33.4 g/dL (ref 30.0–36.0)
MCV: 85.5 fl (ref 78.0–100.0)
Platelets: 195 10*3/uL (ref 150.0–400.0)
RBC: 4.61 Mil/uL (ref 3.87–5.11)
RDW: 18.9 % — ABNORMAL HIGH (ref 11.5–15.5)
WBC: 12.6 10*3/uL — ABNORMAL HIGH (ref 4.0–10.5)

## 2020-05-07 MED ORDER — AZATHIOPRINE 50 MG PO TABS
50.0000 mg | ORAL_TABLET | Freq: Every day | ORAL | Status: DC
Start: 2020-05-07 — End: 2020-05-07

## 2020-05-07 MED ORDER — IPRATROPIUM-ALBUTEROL 0.5-2.5 (3) MG/3ML IN SOLN: 3.0000 mL | Freq: Four times a day (QID) | RESPIRATORY_TRACT | 3 refills | Status: DC | PRN

## 2020-05-07 MED ORDER — AZATHIOPRINE 50 MG PO TABS: 50.0000 mg | ORAL_TABLET | Freq: Every day | ORAL | 1 refills | Status: DC

## 2020-05-07 NOTE — Patient Instructions (Signed)
We will start you on a new medication called azathioprine 50 mg a day Check labs today including CBC with differential, and PT levels, serum galactomannan and fungitell  We will place an order to the DME to increase oxygen concentrator to 10 L We will order nebulizer machine and duo nebs I will work on scheduling a bronchoscope Follow-up in 2 to 4 weeks.

## 2020-05-07 NOTE — Progress Notes (Addendum)
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 She initially sefl quarantined for 14 days , but was admitted at the end of her quarantine when she presented with SOB. She was on bactrim at this time for UTI. She completed 4 days Remdesivir and given course of steroids. Her course was complicated by postviral bacterial pneumoniae treated with multiple rounds of antibiotics including ceftriaxone and azithromycin followed by multiple rounds of doxycycline. She has improved but continues to have a cough noticeable at night. Denies any sputum production. She has a low grade fever up until end of last week and a 30 lb weight loss since her infection started in November. She continues to follow with oncology and her breast cancer is in remission.   She was hospitalized in November 2021 at [facility] for worsening respiratory failure with CT scan showing new infiltrates. Treated with antibiotics, started on supplemental oxygen. Started on prednisone an outpatient for possible cryptogenic organizing pneumonia  Pets: Poodles  Occupation: Customer service manager, works mostly in hospitals and universities on [employer]  Exposures: no hot exposure, pari an love birds 20 years, , no down comforter or pillow Smoking history: 10 years , 1 pp week Travel history: no travel history  Relevant family history: no family history of lung disease   Interim history: Hospitalized again in early February 2022 with  repeat COVID-19 infection treated with remdesivir, IV steroids and discharged on prednisone at 40 mg She felt better for a few weeks and then started developing worsening dyspnea, fevers.  She got a round of Levaquin as an outpatient in mid February and prednisone increased to 60 mg   Continues on supplemental oxygen up to 5 to 7 L/min, continues to be dyspneic, tachypneic.  She feels that steroids initially helped but now they are not helping her anymore.  Outpatient Encounter Medications as of 05/07/2020  Medication Sig  . acetaminophen (TYLENOL) 500 MG tablet Take 1,000 mg by mouth every 6 (six) hours as needed for fever or mild pain.  Marland Kitchen albuterol (VENTOLIN HFA) 108 (90 Base) MCG/ACT inhaler Inhale 2 puffs into the lungs every 4 (four) hours as needed for wheezing or shortness of breath.  Marland Kitchen aspirin 81 MG tablet Take 1 tablet (81 mg total) by mouth daily.  . benzonatate (TESSALON) 200 MG capsule Take 1 capsule (200 mg total) by mouth in the morning, at noon, and at bedtime.  . Cholecalciferol 25 MCG (1000 UT) tablet Take 5,000 Units by mouth daily.   . feeding supplement (ENSURE ENLIVE / ENSURE PLUS) LIQD Take 237 mLs by mouth 3 (three) times daily between meals.  . fluticasone furoate-vilanterol (BREO ELLIPTA) 200-25 MCG/INH AEPB Inhale 1 puff into the lungs daily.  Marland Kitchen gabapentin (NEURONTIN) 300 MG capsule Take 300 mg by mouth at bedtime.  Marland Kitchen guaiFENesin (MUCINEX) 600 MG 12 hr tablet Take 600 mg by mouth 2 (two) times daily as needed for cough or to loosen phlegm.  Marland Kitchen levofloxacin (  LEVAQUIN) 750 MG tablet Take 1 tablet (750 mg total) by mouth daily.  . Melatonin 10 MG TABS Take 20 mg by mouth at bedtime.  . predniSONE (DELTASONE) 10 MG tablet Take 60 mg daily for 7 days,Take 50mg  daily for 7 days,Take 40mg  daily till seen in pulmonary clinic.  Marland Kitchen sulfamethoxazole-trimethoprim (BACTRIM DS) 800-160 MG tablet Take 1 tablet by mouth 3 (three) times a week. (Patient taking differently: Take 1 tablet by  mouth 3 (three) times a week. Mon, Wed, Fri)  . vitamin C (VITAMIN C) 500 MG tablet Take 1 tablet (500 mg total) by mouth daily.  Marland Kitchen zinc sulfate 220 (50 Zn) MG capsule Take 1 capsule (220 mg total) by mouth daily. (Patient taking differently: Take 50 mg by mouth daily.)  . [DISCONTINUED] pantoprazole (PROTONIX) 40 MG tablet Take 1 tablet (40 mg total) by mouth daily.   Facility-Administered Encounter Medications as of 05/07/2020  Medication  . heparin lock flush 100 unit/mL  . sodium chloride flush (NS) 0.9 % injection 10 mL  . sodium chloride flush (NS) 0.9 % injection 10 mL  . sodium chloride flush (NS) 0.9 % injection 10 mL    Physical Exam: Blood pressure 108/68, pulse (!) 119, temperature 97.9 F (36.6 C), temperature source Temporal, height 5\' 3"  (1.6 m), weight 150 lb 12.8 oz (68.4 kg), SpO2 92 %. Gen:      No acute distress HEENT:  EOMI, sclera anicteric Neck:     No masses; no thyromegaly Lungs:    Clear to auscultation bilaterally; normal respiratory effort CV:         Regular rate and rhythm; no murmurs Abd:      + bowel sounds; soft, non-tender; no palpable masses, no distension Ext:    No edema; adequate peripheral perfusion Skin:      Warm and dry; no rash Neuro: alert and oriented x 3 Psych: normal mood and affect  Data Reviewed: Imaging:  CT Angio Chest 02/27/2019: No PE, bilateral  scattered  ground glass opacity  Chest 2 view 03/22/2019: Improvement compared to prior imaging, poor expansion , opacity in lingula  HRCT 08/05/2019-improving bilateral groundglass opacities, mild subpleural fibrosis in the anterior upper lobe. HRCT 01/27/2020- new left lower lobe consolidation with groundglass opacities in the left lower lobe. Improvement in opacities in the left upper lobe. CTA 04/08/2020-extensive multifocal consolidations and groundglass opacities in the lower lobe. I have reviewed the images personally.  PFTs:  09/01/2019 FVC 2.70 [79%], FEV1 2.37 [89%], F/F 88, TLC 4.08  [80%], DLCO 13.06 [63%] Minimal reversible obstruction, moderate diffusion defect  6-minute walk test 09/20/2019- 71 m, nadir O2 sat of 98%  Labs: CBC 04/08/2019-WBC 4.7, eos 10%, absolute eosinophil count 470  Assessment:  Recurrent post Covid pneumonia Cryptogenic organizing pneumonia This is a difficult situation.  She has had at 3 episodes of COVID-19 infection so far and persistent respiratory failure with lung infiltrates in the interim which is suspected to be COP.  She has also been treated multiple times for bacterial infection with antibiotics. Patient is on rituximab for neuromyelitis optica which makes her more susceptible to infection.  Currently on prednisone at 60 mg with significant dyspnea.  As she has failed prednisone we can consider alternate agent such as Imuran, CellCept Ideally would like to do a bronchoscope to rule out opportunistic infections, fungal infections but I do not think she is in a state to tolerate a bronch with significant dyspnea and oxygen requirements.  I had a detailed discussion  with patient and daughter in office today.  Risk/benefit of various approaches reviewed. We will start her on Imuran at 50 mg, check TPMT levels, serum Fungitell and Aspergillus antigen If any of the latter tests are positive then we can consider bronch with understanding that she is at high risk for respiratory failure during the procedure  Plan/Recommendations: Continue prednisone at 60 mg, continue Bactrim prophylaxis Start azathioprine at 50 mg Checking TPMT, serum Fungitell, Aspergillus antigen Continue supplemental oxygen with 2 L concentrator Nebulizers and duo nebs.  Marshell Garfinkel MD [facility] Please see [email or web address] for pager details.  05/07/2020, 8:56 AM   CC: Sharilyn Sites, MD

## 2020-05-08 ENCOUNTER — Telehealth: Payer: Self-pay | Admitting: Pulmonary Disease

## 2020-05-08 ENCOUNTER — Encounter (HOSPITAL_COMMUNITY): Payer: Self-pay

## 2020-05-08 ENCOUNTER — Other Ambulatory Visit: Payer: Self-pay

## 2020-05-08 ENCOUNTER — Encounter: Payer: Self-pay | Admitting: Pulmonary Disease

## 2020-05-08 ENCOUNTER — Emergency Department (HOSPITAL_COMMUNITY): Payer: BC Managed Care – PPO

## 2020-05-08 ENCOUNTER — Inpatient Hospital Stay (HOSPITAL_COMMUNITY)
Admission: EM | Admit: 2020-05-08 | Discharge: 2020-05-15 | DRG: 871 | Disposition: A | Discharge status: Home Health | Payer: BC Managed Care – PPO | Attending: Internal Medicine | Admitting: Internal Medicine

## 2020-05-08 DIAGNOSIS — R627 Adult failure to thrive: Secondary | ICD-10-CM | POA: Diagnosis present

## 2020-05-08 DIAGNOSIS — A419 Sepsis, unspecified organism: Secondary | ICD-10-CM | POA: Diagnosis not present

## 2020-05-08 DIAGNOSIS — K219 Gastro-esophageal reflux disease without esophagitis: Secondary | ICD-10-CM | POA: Diagnosis not present

## 2020-05-08 DIAGNOSIS — R6520 Severe sepsis without septic shock: Secondary | ICD-10-CM | POA: Diagnosis not present

## 2020-05-08 DIAGNOSIS — E43 Unspecified severe protein-calorie malnutrition: Secondary | ICD-10-CM | POA: Diagnosis not present

## 2020-05-08 DIAGNOSIS — Z9221 Personal history of antineoplastic chemotherapy: Secondary | ICD-10-CM

## 2020-05-08 DIAGNOSIS — Z87891 Personal history of nicotine dependence: Secondary | ICD-10-CM

## 2020-05-08 DIAGNOSIS — R7401 Elevation of levels of liver transaminase levels: Secondary | ICD-10-CM | POA: Diagnosis not present

## 2020-05-08 DIAGNOSIS — Z923 Personal history of irradiation: Secondary | ICD-10-CM | POA: Diagnosis not present

## 2020-05-08 DIAGNOSIS — R0602 Shortness of breath: Secondary | ICD-10-CM | POA: Diagnosis not present

## 2020-05-08 DIAGNOSIS — Z6826 Body mass index (BMI) 26.0-26.9, adult: Secondary | ICD-10-CM

## 2020-05-08 DIAGNOSIS — M79604 Pain in right leg: Secondary | ICD-10-CM | POA: Diagnosis not present

## 2020-05-08 DIAGNOSIS — E1165 Type 2 diabetes mellitus with hyperglycemia: Secondary | ICD-10-CM | POA: Diagnosis present

## 2020-05-08 DIAGNOSIS — Z803 Family history of malignant neoplasm of breast: Secondary | ICD-10-CM | POA: Diagnosis not present

## 2020-05-08 DIAGNOSIS — Z79899 Other long term (current) drug therapy: Secondary | ICD-10-CM

## 2020-05-08 DIAGNOSIS — Z7982 Long term (current) use of aspirin: Secondary | ICD-10-CM | POA: Diagnosis not present

## 2020-05-08 DIAGNOSIS — Z8701 Personal history of pneumonia (recurrent): Secondary | ICD-10-CM

## 2020-05-08 DIAGNOSIS — Z7952 Long term (current) use of systemic steroids: Secondary | ICD-10-CM

## 2020-05-08 DIAGNOSIS — L8990 Pressure ulcer of unspecified site, unspecified stage: Secondary | ICD-10-CM | POA: Insufficient documentation

## 2020-05-08 DIAGNOSIS — J189 Pneumonia, unspecified organism: Secondary | ICD-10-CM | POA: Diagnosis not present

## 2020-05-08 DIAGNOSIS — R7989 Other specified abnormal findings of blood chemistry: Secondary | ICD-10-CM | POA: Diagnosis not present

## 2020-05-08 DIAGNOSIS — I82409 Acute embolism and thrombosis of unspecified deep veins of unspecified lower extremity: Secondary | ICD-10-CM

## 2020-05-08 DIAGNOSIS — J84116 Cryptogenic organizing pneumonia: Secondary | ICD-10-CM | POA: Diagnosis not present

## 2020-05-08 DIAGNOSIS — Z853 Personal history of malignant neoplasm of breast: Secondary | ICD-10-CM | POA: Diagnosis not present

## 2020-05-08 DIAGNOSIS — Z20822 Contact with and (suspected) exposure to covid-19: Secondary | ICD-10-CM | POA: Diagnosis not present

## 2020-05-08 DIAGNOSIS — E872 Acidosis, unspecified: Secondary | ICD-10-CM | POA: Diagnosis present

## 2020-05-08 DIAGNOSIS — J9601 Acute respiratory failure with hypoxia: Secondary | ICD-10-CM | POA: Diagnosis not present

## 2020-05-08 DIAGNOSIS — R0689 Other abnormalities of breathing: Secondary | ICD-10-CM | POA: Diagnosis not present

## 2020-05-08 DIAGNOSIS — Z90710 Acquired absence of both cervix and uterus: Secondary | ICD-10-CM

## 2020-05-08 DIAGNOSIS — Z86718 Personal history of other venous thrombosis and embolism: Secondary | ICD-10-CM | POA: Diagnosis not present

## 2020-05-08 DIAGNOSIS — R000 Tachycardia, unspecified: Secondary | ICD-10-CM | POA: Diagnosis not present

## 2020-05-08 DIAGNOSIS — G360 Neuromyelitis optica [Devic]: Secondary | ICD-10-CM | POA: Diagnosis present

## 2020-05-08 DIAGNOSIS — R531 Weakness: Secondary | ICD-10-CM

## 2020-05-08 DIAGNOSIS — U071 COVID-19: Secondary | ICD-10-CM | POA: Diagnosis not present

## 2020-05-08 DIAGNOSIS — Z0184 Encounter for antibody response examination: Secondary | ICD-10-CM

## 2020-05-08 DIAGNOSIS — U099 Post covid-19 condition, unspecified: Secondary | ICD-10-CM | POA: Diagnosis not present

## 2020-05-08 DIAGNOSIS — J069 Acute upper respiratory infection, unspecified: Secondary | ICD-10-CM | POA: Diagnosis not present

## 2020-05-08 DIAGNOSIS — D84821 Immunodeficiency due to drugs: Secondary | ICD-10-CM | POA: Diagnosis not present

## 2020-05-08 DIAGNOSIS — F419 Anxiety disorder, unspecified: Secondary | ICD-10-CM | POA: Diagnosis not present

## 2020-05-08 DIAGNOSIS — Z8616 Personal history of COVID-19: Secondary | ICD-10-CM | POA: Diagnosis not present

## 2020-05-08 DIAGNOSIS — J9621 Acute and chronic respiratory failure with hypoxia: Secondary | ICD-10-CM | POA: Diagnosis present

## 2020-05-08 DIAGNOSIS — Z885 Allergy status to narcotic agent status: Secondary | ICD-10-CM

## 2020-05-08 DIAGNOSIS — J9611 Chronic respiratory failure with hypoxia: Secondary | ICD-10-CM | POA: Diagnosis not present

## 2020-05-08 DIAGNOSIS — Z86711 Personal history of pulmonary embolism: Secondary | ICD-10-CM

## 2020-05-08 DIAGNOSIS — L89152 Pressure ulcer of sacral region, stage 2: Secondary | ICD-10-CM | POA: Diagnosis not present

## 2020-05-08 DIAGNOSIS — R0902 Hypoxemia: Secondary | ICD-10-CM | POA: Diagnosis not present

## 2020-05-08 DIAGNOSIS — Z209 Contact with and (suspected) exposure to unspecified communicable disease: Secondary | ICD-10-CM | POA: Diagnosis not present

## 2020-05-08 LAB — CBC WITH DIFFERENTIAL/PLATELET
Abs Immature Granulocytes: 0.17 10*3/uL — ABNORMAL HIGH (ref 0.00–0.07)
Basophils Absolute: 0 10*3/uL (ref 0.0–0.1)
Basophils Relative: 0 %
Eosinophils Absolute: 0 10*3/uL (ref 0.0–0.5)
Eosinophils Relative: 0 %
HCT: 42.2 % (ref 36.0–46.0)
Hemoglobin: 13.5 g/dL (ref 12.0–15.0)
Immature Granulocytes: 2 %
Lymphocytes Relative: 3 %
Lymphs Abs: 0.2 10*3/uL — ABNORMAL LOW (ref 0.7–4.0)
MCH: 28.3 pg (ref 26.0–34.0)
MCHC: 32 g/dL (ref 30.0–36.0)
MCV: 88.5 fL (ref 80.0–100.0)
Monocytes Absolute: 0.1 10*3/uL (ref 0.1–1.0)
Monocytes Relative: 2 %
Neutro Abs: 7.9 10*3/uL — ABNORMAL HIGH (ref 1.7–7.7)
Neutrophils Relative %: 93 %
Platelets: 175 10*3/uL (ref 150–400)
RBC: 4.77 MIL/uL (ref 3.87–5.11)
RDW: 16.8 % — ABNORMAL HIGH (ref 11.5–15.5)
WBC: 8.5 10*3/uL (ref 4.0–10.5)
nRBC: 0.2 % (ref 0.0–0.2)

## 2020-05-08 LAB — PROTIME-INR
INR: 1 (ref 0.8–1.2)
Prothrombin Time: 13 seconds (ref 11.4–15.2)

## 2020-05-08 LAB — APTT: aPTT: 25 seconds (ref 24–36)

## 2020-05-08 LAB — LACTIC ACID, PLASMA: Lactic Acid, Venous: 2.9 mmol/L (ref 0.5–1.9)

## 2020-05-08 MED ORDER — ACETAMINOPHEN 500 MG PO TABS
1000.0000 mg | ORAL_TABLET | Freq: Once | ORAL | Status: AC
  Administered 2020-05-08: 1000 mg via ORAL
  Filled 2020-05-08: qty 2

## 2020-05-08 MED ORDER — SODIUM CHLORIDE 0.9 % IV BOLUS
1000.0000 mL | Freq: Once | INTRAVENOUS | Status: AC
  Administered 2020-05-09: 1000 mL via INTRAVENOUS

## 2020-05-08 MED ORDER — SODIUM CHLORIDE 0.9 % IV SOLN
500.0000 mg | INTRAVENOUS | Status: DC
  Administered 2020-05-09: 500 mg via INTRAVENOUS
  Filled 2020-05-08: qty 500

## 2020-05-08 MED ORDER — SODIUM CHLORIDE 0.9 % IV SOLN
2.0000 g | INTRAVENOUS | Status: DC
  Administered 2020-05-09: 2 g via INTRAVENOUS
  Filled 2020-05-08: qty 20

## 2020-05-08 MED ORDER — LACTATED RINGERS IV SOLN: INTRAVENOUS | Status: AC

## 2020-05-08 NOTE — Telephone Encounter (Signed)
Yes. Continue bactrim as long as she is on imuran and steroids

## 2020-05-08 NOTE — Progress Notes (Signed)
Patient came in on NRB, tachycardic and tachypneic.  Placed patient on [place] HFNC after moving her from stretcher to ICU bed.  Instructed patient on purse lip breathing and taking deeper breaths.  Have patient on 13L [place] HFNC with an O2 sat of 98%.  Patient is still tachycardic, but rate has came down into 20s and lower 30s.  Will continue to monitor.

## 2020-05-08 NOTE — Telephone Encounter (Signed)
PM please advise if the pt will need to continue the bactrim along with the [facility].  Thanks

## 2020-05-08 NOTE — ED Triage Notes (Signed)
Pt here from home via [facility] for sob. Recently d/c from hospital.  On arrival pt was pale, 89%.. placed on  15L NRB sats 99%   22g r FA

## 2020-05-08 NOTE — ED Provider Notes (Signed)
[facility] Provider Note   CSN: [id number] Arrival date & time: 05/08/20  2315     History No chief complaint on file.   Grace Moyer is a 60 y.o. female.  60 year old female who comes in with respiratory distress via EMS.  Patient history is supplied by the medical records, EMS and patient.  Apparently she has a history of neuromyelitis optica, breast cancer that has spread to lymph node status post resection and chemotherapy no longer on it, Covid complicated by bilateral pneumonia afterwards on chronic oxygen who presents the emerge department today with acute worsening.  Patient states that she was at her baseline for the most part but over the last week or 2 she noticed increased dyspnea on exertion but got better with rest.  Over the last 24 hours her breathing got significantly worse.  She had a fever of 101 prior to coming to the emergency room.  She states that she increased her oxygen at home and it seemed to help.  When EMS got there they said that she was pale and her oxygen was 89% on her 6 L and she was very tachypneic.  They started on nonrebreather sats have been 100% and they brought her here for further evaluation.  She was very tachycardic with them but appeared to be sinus and not A. fib or SVT.   From Dr. Rolla Etienne office visit on 3/7:  Assessment:  Recurrent post Covid pneumonia Cryptogenic organizing pneumonia This is a difficult situation.  She has had at 3 episodes of COVID-19 infection so far and persistent respiratory failure with lung infiltrates in the interim which is suspected to be COP.  She has also been treated multiple times for bacterial infection with antibiotics. Patient is on rituximab for neuromyelitis optica which makes her more susceptible to infection.  Currently on prednisone at 60 mg with significant dyspnea.  As she has failed prednisone we can consider alternate agent such as Rituxan, CellCept Ideally would like to do a  bronchoscope to rule out opportunistic infections, fungal infections but I do not think she is in a state to tolerate a bronch with significant dyspnea and oxygen requirements.  I had a detailed discussion with patient and daughter in office today.  Risk/benefit of various approaches reviewed. We will start her on Imuran at 50 mg, check TPMT levels, serum Fungitell and Aspergillus antigen If any of the latter tests are positive then we can consider bronch with understanding that she is at high risk for respiratory failure during the procedure  Plan/Recommendations: Continue prednisone at 60 mg, continue Bactrim prophylaxis Start azathioprine at 50 mg Checking TPMT, serum Fungitell, Aspergillus antigen Continue supplemental oxygen with 2 L concentrator Nebulizers and duo nebs.     Past Medical History:  Diagnosis Date  . Anxiety   . Breast cancer ([place])   . Breast cancer ([place])   . Family history of breast cancer   . Family history of colon cancer   . Malignant neoplasm of upper-outer quadrant of left female breast ([place]) 01/31/2016  . Neuromyelitis optica ([place])   . Personal history of chemotherapy   . Personal history of radiation therapy     Patient Active Problem List   Diagnosis Date Noted  . Acute respiratory failure with hypoxia ([place]) 04/09/2020  . Lactic acidosis 04/09/2020  . Pneumonia due to COVID-19 virus 04/09/2020  . Abnormal findings on diagnostic imaging of lung 02/10/2020  . Healthcare maintenance 02/10/2020  . Cryptogenic organizing pneumonia ([place])  02/10/2020  . Chronic respiratory failure with hypoxia ([place]) 02/10/2020  . Acute hypoxemic respiratory failure ([place]) 01/27/2020  . Prediabetes   . Transaminitis   . Gastroesophageal reflux disease   . Class 2 obesity due to excess calories with body mass index (BMI) of 36.0 to 36.9 in adult   . History of COVID-19 02/10/2019  . Personal history of venous thrombosis and embolism 12/31/2016  . Peripheral neuropathy  due to chemotherapy ([place]) 12/31/2016  . History of therapeutic radiation 12/03/2016  . Acute pulmonary embolus ([place]) 08/15/2016  . Anemia due to antineoplastic chemotherapy 04/23/2016  . Genetic testing 04/03/2016  . Port catheter in place 03/26/2016  . Family history of breast cancer   . Family history of colon cancer   . Malignant neoplasm of upper-outer quadrant of left breast in female, estrogen receptor negative ([place]) 01/31/2016  . Neuromyelitis optica ([place]) 10/12/2015  . Bilateral leg numbness 08/14/2015  . Neck pain on left side 08/14/2015  . Abnormal MRI of head 08/14/2015    Past Surgical History:  Procedure Laterality Date  . ABDOMINAL HYSTERECTOMY     partial  . BREAST BIOPSY Left 2017  . BREAST LUMPECTOMY Left    2018  . BREAST LUMPECTOMY WITH RADIOACTIVE SEED AND SENTINEL LYMPH NODE BIOPSY Left 07/31/2016   Procedure: LEFT BREAST RADIOACTIVE SEED GUIDED LUMPECTOMY WITH LEFT RADIOACTIVE SEED TARGETED AXILLARY SENTINEL LYMPH NODE EXCISION AND SENTINEL LYMPH NODE BIOPSY;  Surgeon: Rolm Bookbinder, MD;  Location: [facility];  Service: General;  Laterality: Left;  . BREAST REDUCTION SURGERY Bilateral 08/05/2016   Procedure: LEFT ONCOPLASTIC REDUCTION; RIGHT BREAST REDUCTION;  Surgeon: Irene Limbo, MD;  Location: [facility];  Service: Plastics;  Laterality: Bilateral;  . EYE SURGERY    . PORT-A-CATH REMOVAL Right 07/31/2016   Procedure: REMOVAL PORT-A-CATH;  Surgeon: Rolm Bookbinder, MD;  Location: [facility];  Service: General;  Laterality: Right;  . PORTACATH PLACEMENT Right 02/11/2016   Procedure: INSERTION PORT-A-CATH WITH [place];  Surgeon: Rolm Bookbinder, MD;  Location: [facility];  Service: General;  Laterality: Right;  . REDUCTION MAMMAPLASTY       OB History   No obstetric history on file.     Family History  Problem Relation Age of Onset  . Breast cancer Maternal Grandmother        dx in her  62s  . CAD Paternal Grandmother   . Colon cancer Paternal Uncle        dx in his 60s  . COPD Maternal Grandfather   . CAD Paternal Grandfather   . Breast cancer Cousin        dx in her early to mid 35s; maternal first cousin  . Lung cancer Paternal Uncle     Social History   Tobacco Use  . Smoking status: Former Smoker    Types: Cigarettes    Quit date: 09/03/2001    Years since quitting: 18.6  . Smokeless tobacco: Never Used  Substance Use Topics  . Alcohol use: Yes    Comment: social  . Drug use: No    Home Medications Prior to Admission medications   Medication Sig Start Date End Date Taking? Authorizing Provider  acetaminophen (TYLENOL) 500 MG tablet Take 1,000 mg by mouth every 6 (six) hours as needed for fever or mild pain.    [provider]  albuterol (VENTOLIN HFA) 108 (90 Base) MCG/ACT inhaler Inhale 2 puffs into the lungs every 4 (four) hours as needed for  wheezing or shortness of breath. 02/10/20   Lauraine Rinne, NP  aspirin 81 MG tablet Take 1 tablet (81 mg total) by mouth daily. 03/30/17   Magrinat, Virgie Dad, MD  azaTHIOprine (IMURAN) 50 MG tablet Take 1 tablet (50 mg total) by mouth daily. 05/07/20   Mannam, Hart Robinsons, MD  benzonatate (TESSALON) 200 MG capsule Take 1 capsule (200 mg total) by mouth in the morning, at noon, and at bedtime. 03/22/20   Marshell Garfinkel, MD  Cholecalciferol 25 MCG (1000 UT) tablet Take 5,000 Units by mouth daily.     [provider]  feeding supplement (ENSURE ENLIVE / ENSURE PLUS) LIQD Take 237 mLs by mouth 3 (three) times daily between meals. 04/11/20   Lavina Hamman, MD  fluticasone furoate-vilanterol (BREO ELLIPTA) 200-25 MCG/INH AEPB Inhale 1 puff into the lungs daily. 03/22/20   Mannam, Hart Robinsons, MD  gabapentin (NEURONTIN) 300 MG capsule Take 300 mg by mouth at bedtime.    [provider]  guaiFENesin (MUCINEX) 600 MG 12 hr tablet Take 600 mg by mouth 2 (two) times daily as needed for cough or to loosen phlegm.     [provider]  ipratropium-albuterol (DUONEB) 0.5-2.5 (3) MG/3ML SOLN Take 3 mLs by nebulization every 6 (six) hours as needed. 05/07/20   Mannam, Hart Robinsons, MD  levofloxacin (LEVAQUIN) 750 MG tablet Take 1 tablet (750 mg total) by mouth daily. 04/19/20   Mannam, Hart Robinsons, MD  Melatonin 10 MG TABS Take 20 mg by mouth at bedtime.    [provider]  predniSONE (DELTASONE) 10 MG tablet Take 60 mg daily for 7 days,Take 50mg  daily for 7 days,Take 40mg  daily till seen in pulmonary clinic. 04/11/20   Lavina Hamman, MD  sulfamethoxazole-trimethoprim (BACTRIM DS) 800-160 MG tablet Take 1 tablet by mouth 3 (three) times a week. Patient taking differently: Take 1 tablet by mouth 3 (three) times a week. Mon, Wed, Fri 03/23/20   Marshell Garfinkel, MD  vitamin C (VITAMIN C) 500 MG tablet Take 1 tablet (500 mg total) by mouth daily. 02/14/19   Barton Dubois, MD  zinc sulfate 220 (50 Zn) MG capsule Take 1 capsule (220 mg total) by mouth daily. Patient taking differently: Take 50 mg by mouth daily. 02/14/19   Barton Dubois, MD  pantoprazole (PROTONIX) 40 MG tablet Take 1 tablet (40 mg total) by mouth daily. 02/13/19 02/27/19  Barton Dubois, MD    Allergies    Hydrocodone  Review of Systems   Review of Systems  All other systems reviewed and are negative.   Physical Exam Updated Vital Signs BP 125/86 (BP Location: Right Arm)   Pulse (!) 150   Temp (!) 101.6 F (38.7 C) (Oral)   Resp (!) 35   Ht 5\' 3"  (1.6 m)   Wt 68.4 kg   SpO2 96%   BMI 26.71 kg/m   Physical Exam Vitals and nursing note reviewed.  Constitutional:      General: She is in acute distress.     Appearance: She is well-developed and well-nourished.  HENT:     Head: Normocephalic and atraumatic.     Nose: No congestion or rhinorrhea.     Mouth/Throat:     Mouth: Mucous membranes are moist.     Pharynx: Oropharynx is clear.  Eyes:     Pupils: Pupils are equal, round, and reactive to light.  Cardiovascular:      Rate and Rhythm: Regular rhythm. Tachycardia present.  Pulmonary:     Effort: Respiratory distress  present.     Breath sounds: No stridor. Rales ( Worse in the bases) present.  Abdominal:     General: Abdomen is flat. There is no distension.  Musculoskeletal:        General: No swelling or tenderness. Normal range of motion.     Cervical back: Normal range of motion.  Skin:    General: Skin is warm and dry.  Neurological:     General: No focal deficit present.     Mental Status: She is alert.     ED Results / Procedures / Treatments   Labs (all labs ordered are listed, but only abnormal results are displayed) Labs Reviewed  RESP PANEL BY RT-PCR (FLU A&B, COVID) ARPGX2  CULTURE, BLOOD (ROUTINE X 2)  CULTURE, BLOOD (ROUTINE X 2)  URINE CULTURE  LACTIC ACID, PLASMA  LACTIC ACID, PLASMA  COMPREHENSIVE METABOLIC PANEL  CBC WITH DIFFERENTIAL/PLATELET  PROTIME-INR  APTT  URINALYSIS, ROUTINE W REFLEX MICROSCOPIC    EKG None  Radiology No results found.  Procedures .Critical Care Performed by: Merrily Pew, MD Authorized by: Merrily Pew, MD   Critical care provider statement:    Critical care time (minutes):  45   Critical care was necessary to treat or prevent imminent or life-threatening deterioration of the following conditions:  Sepsis and respiratory failure   Critical care was time spent personally by me on the following activities:  Discussions with consultants, evaluation of patient's response to treatment, examination of patient, ordering and performing treatments and interventions, ordering and review of laboratory studies, ordering and review of radiographic studies, pulse oximetry, re-evaluation of patient's condition, obtaining history from patient or surrogate and review of old charts     Medications Ordered in ED Medications  lactated ringers infusion (has no administration in time range)  cefTRIAXone (ROCEPHIN) 2 g in sodium chloride 0.9 % 100 mL IVPB  (has no administration in time range)  azithromycin (ZITHROMAX) 500 mg in sodium chloride 0.9 % 250 mL IVPB (has no administration in time range)    ED Course  I have reviewed the triage vital signs and the nursing notes.  Pertinent labs & imaging results that were available during my care of the patient were reviewed by me and considered in my medical decision making (see chart for details).    MDM Rules/Calculators/A&P                         Patient has crackles in the bases, fever, tachypnea, tachycardia.  Code sepsis initiated.  Antibiotics initiated.  Could also be fluid overload with her history of being on chemotherapy and progressive worsening so we will get a chest x-ray prior to flooding her fluid that she really does not want to be ventilated if at all possible. VS somewhat improved with defeverscence and ativan. Appears stable and improving. Admit.   Final Clinical Impression(s) / ED Diagnoses Final diagnoses:  Sepsis, due to unspecified organism, unspecified whether acute organ dysfunction present [facility])  Acute respiratory failure with hypoxia [facility])    Rx / DC Orders ED Discharge Orders    None       Mesner, Corene Cornea, MD 05/09/20 [phone]

## 2020-05-08 NOTE — Telephone Encounter (Signed)
Spoke with pt and reviewed Dr. Matilde Bash advice. Pt stated understanding. Nothing further needed at this time.

## 2020-05-09 ENCOUNTER — Inpatient Hospital Stay (HOSPITAL_COMMUNITY): Payer: BC Managed Care – PPO

## 2020-05-09 DIAGNOSIS — D84821 Immunodeficiency due to drugs: Secondary | ICD-10-CM | POA: Diagnosis present

## 2020-05-09 DIAGNOSIS — Z8616 Personal history of COVID-19: Secondary | ICD-10-CM | POA: Diagnosis not present

## 2020-05-09 DIAGNOSIS — R6520 Severe sepsis without septic shock: Secondary | ICD-10-CM | POA: Diagnosis present

## 2020-05-09 DIAGNOSIS — Z7952 Long term (current) use of systemic steroids: Secondary | ICD-10-CM | POA: Diagnosis not present

## 2020-05-09 DIAGNOSIS — Z86718 Personal history of other venous thrombosis and embolism: Secondary | ICD-10-CM | POA: Diagnosis not present

## 2020-05-09 DIAGNOSIS — R7401 Elevation of levels of liver transaminase levels: Secondary | ICD-10-CM

## 2020-05-09 DIAGNOSIS — Z923 Personal history of irradiation: Secondary | ICD-10-CM | POA: Diagnosis not present

## 2020-05-09 DIAGNOSIS — L89152 Pressure ulcer of sacral region, stage 2: Secondary | ICD-10-CM | POA: Diagnosis present

## 2020-05-09 DIAGNOSIS — E43 Unspecified severe protein-calorie malnutrition: Secondary | ICD-10-CM | POA: Diagnosis present

## 2020-05-09 DIAGNOSIS — Z90710 Acquired absence of both cervix and uterus: Secondary | ICD-10-CM | POA: Diagnosis not present

## 2020-05-09 DIAGNOSIS — A419 Sepsis, unspecified organism: Secondary | ICD-10-CM | POA: Diagnosis present

## 2020-05-09 DIAGNOSIS — J9601 Acute respiratory failure with hypoxia: Secondary | ICD-10-CM

## 2020-05-09 DIAGNOSIS — J189 Pneumonia, unspecified organism: Secondary | ICD-10-CM

## 2020-05-09 DIAGNOSIS — J84116 Cryptogenic organizing pneumonia: Secondary | ICD-10-CM | POA: Diagnosis present

## 2020-05-09 DIAGNOSIS — Z79899 Other long term (current) drug therapy: Secondary | ICD-10-CM | POA: Diagnosis not present

## 2020-05-09 DIAGNOSIS — J9621 Acute and chronic respiratory failure with hypoxia: Secondary | ICD-10-CM | POA: Diagnosis present

## 2020-05-09 DIAGNOSIS — Z803 Family history of malignant neoplasm of breast: Secondary | ICD-10-CM | POA: Diagnosis not present

## 2020-05-09 DIAGNOSIS — Z853 Personal history of malignant neoplasm of breast: Secondary | ICD-10-CM | POA: Diagnosis not present

## 2020-05-09 DIAGNOSIS — E872 Acidosis: Secondary | ICD-10-CM | POA: Diagnosis not present

## 2020-05-09 DIAGNOSIS — K219 Gastro-esophageal reflux disease without esophagitis: Secondary | ICD-10-CM | POA: Diagnosis present

## 2020-05-09 DIAGNOSIS — Z87891 Personal history of nicotine dependence: Secondary | ICD-10-CM | POA: Diagnosis not present

## 2020-05-09 DIAGNOSIS — U099 Post covid-19 condition, unspecified: Secondary | ICD-10-CM | POA: Diagnosis present

## 2020-05-09 DIAGNOSIS — G360 Neuromyelitis optica [Devic]: Secondary | ICD-10-CM | POA: Diagnosis present

## 2020-05-09 DIAGNOSIS — Z7982 Long term (current) use of aspirin: Secondary | ICD-10-CM | POA: Diagnosis not present

## 2020-05-09 DIAGNOSIS — R627 Adult failure to thrive: Secondary | ICD-10-CM | POA: Diagnosis present

## 2020-05-09 DIAGNOSIS — E1165 Type 2 diabetes mellitus with hyperglycemia: Secondary | ICD-10-CM | POA: Diagnosis present

## 2020-05-09 DIAGNOSIS — Z9221 Personal history of antineoplastic chemotherapy: Secondary | ICD-10-CM | POA: Diagnosis not present

## 2020-05-09 DIAGNOSIS — F419 Anxiety disorder, unspecified: Secondary | ICD-10-CM | POA: Diagnosis present

## 2020-05-09 DIAGNOSIS — Z20822 Contact with and (suspected) exposure to covid-19: Secondary | ICD-10-CM | POA: Diagnosis present

## 2020-05-09 LAB — GLUCOSE, CAPILLARY
Glucose-Capillary: 330 mg/dL — ABNORMAL HIGH (ref 70–99)
Glucose-Capillary: 246 mg/dL — ABNORMAL HIGH (ref 70–99)
Glucose-Capillary: 317 mg/dL — ABNORMAL HIGH (ref 70–99)

## 2020-05-09 LAB — RESP PANEL BY RT-PCR (FLU A&B, COVID) ARPGX2
Influenza A by PCR: NEGATIVE
Influenza B by PCR: NEGATIVE
SARS Coronavirus 2 by RT PCR: POSITIVE — AB

## 2020-05-09 LAB — COMPREHENSIVE METABOLIC PANEL
ALT: 122 U/L — ABNORMAL HIGH (ref 0–44)
ALT: 92 U/L — ABNORMAL HIGH (ref 0–44)
ALT: 97 U/L — ABNORMAL HIGH (ref 0–44)
AST: 39 U/L (ref 15–41)
AST: 44 U/L — ABNORMAL HIGH (ref 15–41)
AST: 65 U/L — ABNORMAL HIGH (ref 15–41)
Albumin: 2.4 g/dL — ABNORMAL LOW (ref 3.5–5.0)
Albumin: 2.6 g/dL — ABNORMAL LOW (ref 3.5–5.0)
Albumin: 3.4 g/dL — ABNORMAL LOW (ref 3.5–5.0)
Alkaline Phosphatase: 135 U/L — ABNORMAL HIGH (ref 38–126)
Alkaline Phosphatase: 148 U/L — ABNORMAL HIGH (ref 38–126)
Alkaline Phosphatase: 188 U/L — ABNORMAL HIGH (ref 38–126)
Anion gap: 10 (ref 5–15)
Anion gap: 13 (ref 5–15)
Anion gap: 9 (ref 5–15)
BUN: 14 mg/dL (ref 6–20)
BUN: 15 mg/dL (ref 6–20)
BUN: 18 mg/dL (ref 6–20)
CO2: 22 mmol/L (ref 22–32)
CO2: 23 mmol/L (ref 22–32)
CO2: 24 mmol/L (ref 22–32)
Calcium: 7.7 mg/dL — ABNORMAL LOW (ref 8.9–10.3)
Calcium: 8.1 mg/dL — ABNORMAL LOW (ref 8.9–10.3)
Calcium: 8.6 mg/dL — ABNORMAL LOW (ref 8.9–10.3)
Chloride: 101 mmol/L (ref 98–111)
Chloride: 105 mmol/L (ref 98–111)
Chloride: 96 mmol/L — ABNORMAL LOW (ref 98–111)
Creatinine, Ser: 0.4 mg/dL — ABNORMAL LOW (ref 0.44–1.00)
Creatinine, Ser: 0.47 mg/dL (ref 0.44–1.00)
Creatinine, Ser: 0.58 mg/dL (ref 0.44–1.00)
GFR, Estimated: >60 mL/min (ref >60)
GFR, Estimated: >60 mL/min (ref >60)
GFR, Estimated: >60 mL/min (ref >60)
Glucose, Bld: 224 mg/dL — ABNORMAL HIGH (ref 70–99)
Glucose, Bld: 226 mg/dL — ABNORMAL HIGH (ref 70–99)
Glucose, Bld: 343 mg/dL — ABNORMAL HIGH (ref 70–99)
Potassium: 3.5 mmol/L (ref 3.5–5.1)
Potassium: 3.6 mmol/L (ref 3.5–5.1)
Potassium: 3.9 mmol/L (ref 3.5–5.1)
Sodium: 133 mmol/L — ABNORMAL LOW (ref 135–145)
Sodium: 133 mmol/L — ABNORMAL LOW (ref 135–145)
Sodium: 137 mmol/L (ref 135–145)
Total Bilirubin: 0.6 mg/dL (ref 0.3–1.2)
Total Bilirubin: 0.7 mg/dL (ref 0.3–1.2)
Total Bilirubin: 1 mg/dL (ref 0.3–1.2)
Total Protein: 5 g/dL — ABNORMAL LOW (ref 6.5–8.1)
Total Protein: 5.3 g/dL — ABNORMAL LOW (ref 6.5–8.1)
Total Protein: 6.5 g/dL (ref 6.5–8.1)

## 2020-05-09 LAB — IRON AND TIBC
Iron: 12 ug/dL — ABNORMAL LOW (ref 28–170)
Saturation Ratios: 7 % — ABNORMAL LOW (ref 10.4–31.8)
TIBC: 177 ug/dL — ABNORMAL LOW (ref 250–450)
UIBC: 165 ug/dL

## 2020-05-09 LAB — BLOOD GAS, VENOUS
Acid-base deficit: 0.1 mmol/L (ref 0.0–2.0)
Bicarbonate: 24.3 mmol/L (ref 20.0–28.0)
FIO2: 60
O2 Saturation: 95.9 %
Patient temperature: 37
pCO2, Ven: 39.4 mmHg — ABNORMAL LOW (ref 44.0–60.0)
pH, Ven: 7.403 (ref 7.250–7.430)
pO2, Ven: 83.3 mmHg — ABNORMAL HIGH (ref 32.0–45.0)

## 2020-05-09 LAB — CBC
HCT: 33.4 % — ABNORMAL LOW (ref 36.0–46.0)
Hemoglobin: 10.3 g/dL — ABNORMAL LOW (ref 12.0–15.0)
MCH: 28 pg (ref 26.0–34.0)
MCHC: 30.8 g/dL (ref 30.0–36.0)
MCV: 90.8 fL (ref 80.0–100.0)
Platelets: 132 10*3/uL — ABNORMAL LOW (ref 150–400)
RBC: 3.68 MIL/uL — ABNORMAL LOW (ref 3.87–5.11)
RDW: 16.9 % — ABNORMAL HIGH (ref 11.5–15.5)
WBC: 4.7 10*3/uL (ref 4.0–10.5)
nRBC: 0 % (ref 0.0–0.2)

## 2020-05-09 LAB — PROCALCITONIN: Procalcitonin: 0.16 ng/mL

## 2020-05-09 LAB — MRSA PCR SCREENING: MRSA by PCR: NEGATIVE

## 2020-05-09 LAB — RETICULOCYTES
Immature Retic Fract: 27.5 % — ABNORMAL HIGH (ref 2.3–15.9)
RBC.: 4.52 MIL/uL (ref 3.87–5.11)
Retic Count, Absolute: 109.4 10*3/uL (ref 19.0–186.0)
Retic Ct Pct: 2.4 % (ref 0.4–3.1)

## 2020-05-09 LAB — FOLATE: Folate: 12.3 ng/mL (ref >5.9)

## 2020-05-09 LAB — BRAIN NATRIURETIC PEPTIDE: B Natriuretic Peptide: 67 pg/mL (ref 0.0–100.0)

## 2020-05-09 LAB — ASPERGILLUS ANTIGEN,SERUM
Aspergillus Ag, EIA: NOT DETECTED
Index Value: 0.11 (ref <0.50)

## 2020-05-09 LAB — FERRITIN: Ferritin: 1484 ng/mL — ABNORMAL HIGH (ref 11–307)

## 2020-05-09 LAB — HEMOGLOBIN A1C
Hgb A1c MFr Bld: 7.9 % — ABNORMAL HIGH (ref 4.8–5.6)
Mean Plasma Glucose: 180.03 mg/dL

## 2020-05-09 LAB — D-DIMER, QUANTITATIVE: D-Dimer, Quant: 1.12 ug/mL-FEU — ABNORMAL HIGH (ref 0.00–0.50)

## 2020-05-09 LAB — HIV ANTIBODY (ROUTINE TESTING W REFLEX): HIV Screen 4th Generation wRfx: NONREACTIVE

## 2020-05-09 LAB — HEPATITIS PANEL, ACUTE
HCV Ab: NONREACTIVE
Hep A IgM: NONREACTIVE
Hep B C IgM: NONREACTIVE
Hepatitis B Surface Ag: NONREACTIVE

## 2020-05-09 LAB — VITAMIN B12: Vitamin B-12: 595 pg/mL (ref 180–914)

## 2020-05-09 LAB — LACTIC ACID, PLASMA: Lactic Acid, Venous: 1.7 mmol/L (ref 0.5–1.9)

## 2020-05-09 MED ORDER — ZINC SULFATE 220 (50 ZN) MG PO CAPS
220.0000 mg | ORAL_CAPSULE | Freq: Every day | ORAL | Status: DC
  Administered 2020-05-09 – 2020-05-15 (×7): 220 mg via ORAL
  Filled 2020-05-09 (×7): qty 1

## 2020-05-09 MED ORDER — SODIUM CHLORIDE 0.9 % IV SOLN
2.0000 g | Freq: Three times a day (TID) | INTRAVENOUS | Status: DC
  Administered 2020-05-09 – 2020-05-11 (×7): 2 g via INTRAVENOUS
  Filled 2020-05-09 (×7): qty 2

## 2020-05-09 MED ORDER — LORAZEPAM 2 MG/ML IJ SOLN
0.5000 mg | Freq: Once | INTRAMUSCULAR | Status: AC
  Administered 2020-05-09: 0.5 mg via INTRAVENOUS
  Filled 2020-05-09: qty 1

## 2020-05-09 MED ORDER — FLUTICASONE FUROATE-VILANTEROL 200-25 MCG/INH IN AEPB
1.0000 | INHALATION_SPRAY | Freq: Every day | RESPIRATORY_TRACT | Status: DC
  Administered 2020-05-09 – 2020-05-15 (×7): 1 via RESPIRATORY_TRACT
  Filled 2020-05-09: qty 28

## 2020-05-09 MED ORDER — INSULIN ASPART 100 UNIT/ML ~~LOC~~ SOLN
0.0000 [IU] | Freq: Three times a day (TID) | SUBCUTANEOUS | Status: DC
  Administered 2020-05-09: 5 [IU] via SUBCUTANEOUS
  Administered 2020-05-09: 2 [IU] via SUBCUTANEOUS
  Administered 2020-05-09: 11 [IU] via SUBCUTANEOUS
  Administered 2020-05-10: 5 [IU] via SUBCUTANEOUS
  Administered 2020-05-10: 15 [IU] via SUBCUTANEOUS
  Administered 2020-05-10: 8 [IU] via SUBCUTANEOUS
  Administered 2020-05-11: 15 [IU] via SUBCUTANEOUS
  Administered 2020-05-11: 5 [IU] via SUBCUTANEOUS
  Administered 2020-05-11: 15 [IU] via SUBCUTANEOUS
  Administered 2020-05-12 (×2): 5 [IU] via SUBCUTANEOUS
  Administered 2020-05-12: 8 [IU] via SUBCUTANEOUS
  Administered 2020-05-13 (×3): 5 [IU] via SUBCUTANEOUS
  Administered 2020-05-14: 8 [IU] via SUBCUTANEOUS
  Administered 2020-05-14: 5 [IU] via SUBCUTANEOUS

## 2020-05-09 MED ORDER — VANCOMYCIN HCL 1500 MG/300ML IV SOLN: 1500.0000 mg | INTRAVENOUS | Status: DC

## 2020-05-09 MED ORDER — SIMETHICONE 80 MG PO CHEW: 80.0000 mg | CHEWABLE_TABLET | Freq: Four times a day (QID) | ORAL | Status: DC | PRN

## 2020-05-09 MED ORDER — MELATONIN 3 MG PO TABS
6.0000 mg | ORAL_TABLET | Freq: Every day | ORAL | Status: DC
Start: 2020-05-09 — End: 2020-05-15
  Administered 2020-05-12: 6 mg via ORAL
  Filled 2020-05-09 (×4): qty 2

## 2020-05-09 MED ORDER — HEPARIN SODIUM (PORCINE) 5000 UNIT/ML IJ SOLN
5000.0000 [IU] | Freq: Three times a day (TID) | INTRAMUSCULAR | Status: DC
  Administered 2020-05-09 – 2020-05-15 (×19): 5000 [IU] via SUBCUTANEOUS
  Filled 2020-05-09 (×19): qty 1

## 2020-05-09 MED ORDER — IBUPROFEN 800 MG PO TABS
800.0000 mg | ORAL_TABLET | Freq: Once | ORAL | Status: AC
  Administered 2020-05-09: 800 mg via ORAL
  Filled 2020-05-09: qty 1

## 2020-05-09 MED ORDER — GABAPENTIN 300 MG PO CAPS
300.0000 mg | ORAL_CAPSULE | Freq: Every day | ORAL | Status: DC
  Administered 2020-05-09 – 2020-05-14 (×6): 300 mg via ORAL
  Filled 2020-05-09 (×6): qty 1

## 2020-05-09 MED ORDER — ENSURE ENLIVE PO LIQD
237.0000 mL | Freq: Three times a day (TID) | ORAL | Status: DC
  Administered 2020-05-09 – 2020-05-11 (×7): 237 mL via ORAL

## 2020-05-09 MED ORDER — METHYLPREDNISOLONE SODIUM SUCC 125 MG IJ SOLR
60.0000 mg | Freq: Four times a day (QID) | INTRAMUSCULAR | Status: DC
  Administered 2020-05-09 – 2020-05-11 (×10): 60 mg via INTRAVENOUS
  Filled 2020-05-09 (×11): qty 2

## 2020-05-09 MED ORDER — CHLORHEXIDINE GLUCONATE CLOTH 2 % EX PADS
6.0000 | MEDICATED_PAD | Freq: Every day | CUTANEOUS | Status: DC
  Administered 2020-05-09 – 2020-05-12 (×4): 6 via TOPICAL

## 2020-05-09 MED ORDER — IPRATROPIUM-ALBUTEROL 0.5-2.5 (3) MG/3ML IN SOLN
3.0000 mL | Freq: Four times a day (QID) | RESPIRATORY_TRACT | Status: DC
  Administered 2020-05-09 (×4): 3 mL via RESPIRATORY_TRACT
  Filled 2020-05-09 (×4): qty 3

## 2020-05-09 MED ORDER — ASPIRIN 81 MG PO CHEW
81.0000 mg | CHEWABLE_TABLET | Freq: Every day | ORAL | Status: DC
  Administered 2020-05-09 – 2020-05-15 (×7): 81 mg via ORAL
  Filled 2020-05-09 (×7): qty 1

## 2020-05-09 MED ORDER — LACTATED RINGERS IV BOLUS
1000.0000 mL | Freq: Once | INTRAVENOUS | Status: AC
  Administered 2020-05-09: 1000 mL via INTRAVENOUS

## 2020-05-09 MED ORDER — VANCOMYCIN HCL 1500 MG/300ML IV SOLN
1500.0000 mg | Freq: Once | INTRAVENOUS | Status: AC
  Administered 2020-05-09: 1500 mg via INTRAVENOUS
  Filled 2020-05-09: qty 300

## 2020-05-09 MED ORDER — ASCORBIC ACID 500 MG PO TABS
500.0000 mg | ORAL_TABLET | Freq: Every day | ORAL | Status: DC
  Administered 2020-05-09 – 2020-05-15 (×7): 500 mg via ORAL
  Filled 2020-05-09 (×7): qty 1

## 2020-05-09 MED ORDER — SALINE SPRAY 0.65 % NA SOLN
1.0000 | NASAL | Status: DC | PRN
  Filled 2020-05-09: qty 44

## 2020-05-09 MED ORDER — GUAIFENESIN ER 600 MG PO TB12
600.0000 mg | ORAL_TABLET | Freq: Two times a day (BID) | ORAL | Status: DC | PRN
  Administered 2020-05-11 (×2): 600 mg via ORAL
  Filled 2020-05-09 (×2): qty 1

## 2020-05-09 MED ORDER — INSULIN ASPART 100 UNIT/ML ~~LOC~~ SOLN
0.0000 [IU] | Freq: Every day | SUBCUTANEOUS | Status: DC
  Administered 2020-05-09: 4 [IU] via SUBCUTANEOUS
  Administered 2020-05-10: 3 [IU] via SUBCUTANEOUS
  Administered 2020-05-11 – 2020-05-12 (×2): 2 [IU] via SUBCUTANEOUS

## 2020-05-09 NOTE — Progress Notes (Signed)
Pharmacy Antibiotic Note  Grace Moyer is a 60 y.o. female admitted on 05/08/2020 with recurrent pneumonia.  Pharmacy has been consulted for vancomycin and cefepime dosing.  Plan: Vancomycin 1500mg  IV Q24H. Goal AUC 400-550.  Expected AUC 530.  SCr used 0.8.  Cefepime 2g IV Q8H.  Height: 5\' 3"  (160 cm) Weight: 68.4 kg (150 lb 12.8 oz) IBW/kg (Calculated) : 52.4  Temp (24hrs), Avg:101.3 F (38.5 C), Min:101 F (38.3 C), Max:101.6 F (38.7 C)  Recent Labs  Lab 05/07/20 0943 05/08/20 2334 05/09/20 0044  WBC 12.6* 8.5  --   CREATININE  --  0.58  --   LATICACIDVEN  --  2.9* 1.7    Estimated Creatinine Clearance: 71.2 mL/min (by C-G formula based on SCr of 0.58 mg/dL).    Allergies  Allergen Reactions  . Hydrocodone Other (See Comments)    Did not help with pain and kept her up all night    Thank you for allowing pharmacy to be a part of this patient's care.  Wynona Neat, PharmD, BCPS  05/09/2020 2:04 AM

## 2020-05-09 NOTE — Progress Notes (Signed)
Pt is DC'ing to [facility], pt is alert and oriented, denies any pain,  vitals are HR- 88, RR 20, BP 159/72. O2-99%, Temp 98.33F, pt currently in the care of Engineer, technical sales at 2256, Raport given to [employer] at [facility] hospital.

## 2020-05-09 NOTE — Progress Notes (Signed)
Patient seen and evaluated at bedside and daughter also updated at bedside.  She has been admitted after midnight, briefly:  Grace Moyer  is a 60 y.o. female, with personal history of radiation, chemotherapy, lumpectomy, neuromyelitis optica, cryptogenic organizing pneumonia, and more who presented to the ER with a chief complaint of dyspnea.  Patient has had an overall decline in her condition from a pulmonary standpoint since she was first diagnosed with Covid in 2020.  She typically wears 4-5 L nasal cannula at home and was admitted with acute hypoxemic respiratory failure requiring 11 L nasal cannula.  She was also quite tachycardic on admission and is quite anxious as well.  She has had prior history of DVT/PE 2018 and completed her course of Xarelto.  She has also recently had CT PE study within the last month which was negative.  She has also had a 30 pound weight loss in the last several months due to her dyspnea and appears to be failing to thrive.  She was seen by her pulmonologist Dr. Vaughan Browner on 3/7 and was thought to have cryptogenic organizing pneumonia with possible opportunistic infections, but she was not stable enough for bronchoscopy.  She was started on Imuran and was told to continue her prednisone, however it appears the patient self tapered off of this.  She has been seen by pulmonology, Dr. Melvyn Novas at [facility] today who recommends transfer to [facility] for further evaluation.  Discussion was had with Dr. Vaughan Browner as well and he will let Dr. Erin Fulling down to see in consultation upon arrival.  She is currently showing signs of improvement and oxygen will be further weaned.  She is currently stable to transfer to telemetry floor.  Vancomycin will be discontinued as MRSA PCR is negative.  Procalcitonin low, and pending further pulmonology recommendations she may be able to come off cefepime as well.  Continue IV steroids as currently ordered.  D-dimer noted to be elevated and ultrasound of  lower extremities to be ordered for further evaluation.  A.m. labs ordered.  Acute on chronic hypoxemic respiratory failure likely secondary to recurrent pneumonia and/or cryptogenic organizing pneumonia -Weaning oxygen to baseline 4-5 L nasal cannula -Possible superimposed opportunistic infection -Continue current antibiotics -Appreciate further pulmonology recommendations upon transfer  Severe sepsis secondary to pneumonia-resolving -Continue cefepime for now -Monitor blood cultures with no growth in last 12 hours -Monitor CBC and lactic acid -IV fluid, time-limited  Sinus tachycardia secondary to above-resolving -Continue telemetry monitoring -Appears to be improving with IV fluid hydration and improvement in dyspnea -No indication to check for PE at this time, Doppler venous ultrasound ordered  D-dimer elevation -Ultrasound Doppler venous studies ordered -Recent PE study 1 month ago  Mild transaminitis -Continue to monitor in a.m.  Anemia -Check anemia panel -No overt bleeding -Status post aggressive IV fluid hydration -Monitor CBC  Weight loss and failure to thrive -Consider palliative evaluation  History of neuromyelitis optica -Takes rituximab at home, holding for now -Pending further pulmonology recommendations  History of COVID-19 pneumonia  Total care time: 60 minutes.

## 2020-05-09 NOTE — ED Notes (Signed)
Date and time results received: 05/09/20 0056 Test: Covid Critical Value: Positive Name of Provider Notified: Dr. Dayna Barker

## 2020-05-09 NOTE — ED Notes (Signed)
Lactic acid 2.9 reported to EDP Mesner at this time 0002. See orders

## 2020-05-09 NOTE — Progress Notes (Signed)
Received referral from Unit nurse today that patient would be transferring soon to [facility] and that she has had many months of dealing with COVID related issues. Found patient laying on her back: awake and alert, and with her daughter bedside. Engaged patient in reflection around her upbringing, spiritual heritage and illness story. She engaged well offering her story around her immuno-compromising illness and how it was further complicated by COVID initially over a year ago. She was tearful today as she shared her struggles and accepted spiritual support today. Chaplain provided active listening, prayer, favorite hymn singing (Sweet Hour of Prayer and How Great Thou Art) and affirmation of faith today. Patient expressed gratefulness for the visit today and is hopeful for a recovery soon. Chaplain will remain available in order to provide spiritual support and to assess for spiritual need.

## 2020-05-09 NOTE — Progress Notes (Signed)
Sepsis tracking by eLINK 

## 2020-05-09 NOTE — Consult Note (Signed)
NAME:  Grace Moyer, MRN:  [id number], DOB:  04/15/1960, LOS: 0 ADMISSION DATE:  05/08/2020, CONSULTATION DATE:  05/09/20 REFERRING MD:  Manuella Ghazi, triad  CHIEF COMPLAINT:  resp failure post covid   Brief History:  48 yowf remote smoker on Rituxan x 2017 from [facility] for neuromyelitis optica with ? Recurrent (vs persistent) covid 19 pna vs organizing pna seen in office 3/7 by Dr Vaughan Browner and rec wean pred and start Imuran but misunderstood instructions and stopped pred 3/7 from a dose of 20 mg to zero (had taken Prednisone 60 mg day prior ) and acutely deteriorated pm 3/8 with fever and sob and readmitted with acute on chronic resp failure and PCCM consulted am 3/9   History of Present Illness:  60 y.o. female, with personal history of radiation, chemotherapy, lumpectomy, neuromyelitis optica, cryptogenic organizing pneumonia, and more presented to the ER  Pm 3/8 with a chief complaint of dyspnea.  Since patient first was diagnosed with Covid in 2020 she has had a decline in her pulmonary health.  Patient most recently has been on 5 L nasal cannula at rest and 7 L nasal cannula with any exertion at home.  Today she could not get her oxygen saturations up past 85% even on 10 L nasal cannula at rest.  Her heart rate was 150.  She was not having coughing spells, they report that she is coughed less lately than in the past.  She did feel feverish and chilled but she did not have chest pain.  Patient reported no rhinorrhea, no body aches, no sore throat.  Patient does have a history of DVT and PE in 2018 that were provoked, and she was on Xarelto for 6 months after that.  She had a PE study 1 month PTA  that was negative.    Patient does report that she has been immobile at home due to dyspnea.  She is also had a 30 pound weight loss in the last several months, due to dyspnea. Patient reports that she is hungry, but she gets so short of breath when she tries to eat that she loses interest in food.  Patient has no  other complaints at this time.   patient was seen in pulmonology office 3/7 by Dr Vaughan Browner with following recs:   Recurrent post Covid pneumonia Cryptogenic organizing pneumonia  She has had at 3 episodes of COVID-19 infection so far and persistent respiratory failure with lung infiltrates in the interim which is suspected to be COP.  She has also been treated multiple times for bacterial infection with antibiotics. Patient is on rituximab for neuromyelitis optica which makes her more susceptible to infection.  Currently on prednisone at 60 mg with significant dyspnea.  As she has failed prednisone we can consider alternate agent such as Rituxan, CellCept Ideally would like to do a bronchoscope to rule out opportunistic infections, fungal infections but I do not think she is in a state to tolerate a bronch with significant dyspnea and oxygen requirements.    rec >>>   start her on Imuran at 50 mg, check TPMT levels, serum Fungitell and Aspergillus antigen If any of the latter tests are positive then we can consider bronch with understanding that she is at high risk for respiratory failure during the procedure  Plan/Recommendations: Continue prednisone at 60 mg, continue Bactrim prophylaxis Start azathioprine at 50 mg Checking TPMT, serum Fungitell, Aspergillus antigen Continue supplemental oxygen with 2 L concentrator Nebulizers and duo nebs.  In the ED Temp 101.6, heart rate 148, respiratory rate 27-35, blood pressure 125/86, saturating at 96% on 13 L high flow nasal cannula No leukocytosis with a white blood cell count of 8.5, hemoglobin 13.5 Chemistry panel  unremarkable aside from a hyperglycemia at 226 Chest x-ray showed a patchy lower lobe predominant opacities have increased from prior exam possible organizing pneumonia post Covid EKG shows a heart rate of 150, sinus tach, QTC 493 Lactic acid was elevated at 2.9 Patient was given Tylenol and then ibuprofen in an effort to  bring her fever down, she was started on Rocephin and Zithromax She is visibly anxious so she was given 1 mg Ativan  She did have a transaminitis as well with an AST of 65 and ALT of 122 Admission requested for further management of acute hypoxic respiratory failure    Past Medical History:   Anxiety  Breast cancer Breast cancer  Neuromyelitis optica   history of chemotherapy  history of radiation therapy.   Significant Hospital Events:    Consults:  PCCM 3/9   Procedures:    Significant Diagnostic Tests:    Micro Data:  Resp Panel  PCR 3/8 > neg flu/ POS COVID 19 MRSA PCR  3/9 neg  BC x 2  3/9 >>>    Antimicrobials:  Zmax 3/8 Cefepime 3/8 Rocephin 3/8 Vanc 3/8    Scheduled Meds: . ascorbic acid  500 mg Oral Daily  . aspirin  81 mg Oral Daily  . feeding supplement  237 mL Oral TID BM  . fluticasone furoate-vilanterol  1 puff Inhalation Daily  . gabapentin  300 mg Oral QHS  . heparin  5,000 Units Subcutaneous Q8H  . insulin aspart  0-15 Units Subcutaneous TID WC  . insulin aspart  0-5 Units Subcutaneous QHS  . ipratropium-albuterol  3 mL Nebulization Q6H  . melatonin  6 mg Oral QHS  . methylPREDNISolone (SOLU-MEDROL) injection  60 mg Intravenous Q6H  . zinc sulfate  220 mg Oral Daily   Continuous Infusions: . ceFEPime (MAXIPIME) IV Stopped (05/09/20 0404)  . lactated ringers 150 mL/hr at 05/09/20 1140  . [START ON 05/10/2020] vancomycin     PRN Meds:.guaiFENesin, simethicone   Interim History / Subjective:  Better overnight but desats on 10 lpm with minimal activity   No cough or cp / good appetitue    Objective   Blood pressure 129/63, pulse (!) 110, temperature 98.7 F (37.1 C), temperature source Oral, resp. rate (!) 36, height 5\' 3"  (1.6 m), weight 69.1 kg, SpO2 95 %.          Intake/Output Summary (Last 24 hours) at 05/09/2020 0733 Last data filed at 05/09/2020 0234 Gross per 24 hour  Intake 2350 ml  Output --  Net 2350 ml   Filed  Weights   05/08/20 2323 05/09/20 0300  Weight: 68.4 kg 69.1 kg    Examination: Tmax 101.6  45 degrees hob/ sats 97% on 10 lpm HFNC  General: middle aged wf mild increased wob at rest  Lungs: distant bs with crackles in bases, no bronchial changes Cardiovascular: RRR no s3 Abdomen: soft/ benign Extremities: warm s Cyanosis/ clubbing or edema Neuro: nl sensorium , no deficits apparent   I personally reviewed images and agree with radiology impression as follows:  CXR:    05/08/20 Patchy lower lobe predominant opacities have increased from prior exam. This may represent organizing pneumonia post COVID. My review: slt worse areation  R base vs prior  Resolved Hospital Problem  list      Assessment & Plan:   1) acute on chronic hypoxemic resp failure s/p COVID with residual steroid dep/ steroid resistant organizing pna ? With superimposed opportunistic infection (doubt this is still active covid but in chronically ill immunosuppressed host it is certainly possible  >>> titrate HFNC 0 2 to sats > 90%   >>> may need fob if can't sort out but this risks ET so best option is transfer to [facility] until sort out best approach ? Is there an alternative here to Rituxan ? Does she need another opinion from [place] [facility] or get [facility] involved?  >> defer to Dr Vaughan Browner   2) abrupt steroid w/d due to misunderstanding with Dr Vaughan Browner who clearly did not intend to go from 30 -21 to 0 over 3 days > continue solumedrol for now and empirical coverage for HCAP for now as well until initial cultures come back  Best practice (evaluated daily)  Diet: per triad Pain/Anxiety/Delirium protocol (if indicated): per [facility] VAP protocol (if indicated): DVT prophylaxis: per [facility] GI prophylaxis: per [facility] Glucose control: n/a Mobility: up as tol Disposition:to be transferred to [facility] / telemetry today if bed available Code Status: full code  Labs   CBC: Recent Labs  Lab 05/07/20 0943 05/08/20 2334  05/09/20 0519  WBC 12.6* 8.5 4.7  NEUTROABS  --  7.9*  --   HGB 13.1 13.5 10.3*  HCT 39.4 42.2 33.4*  MCV 85.5 88.5 90.8  PLT 195.0 175 132*    Basic Metabolic Panel: Recent Labs  Lab 05/08/20 2334 05/09/20 0519  NA 133* 137  K 3.9 3.5  CL 96* 105  CO2 24 23  GLUCOSE 226* 224*  BUN 18 14  CREATININE 0.58 0.47  CALCIUM 8.6* 7.7*   GFR: Estimated Creatinine Clearance: 71.5 mL/min (by C-G formula based on SCr of 0.47 mg/dL). Recent Labs  Lab 05/07/20 0943 05/08/20 2334 05/09/20 0044 05/09/20 0519  WBC 12.6* 8.5  --  4.7  LATICACIDVEN  --  2.9* 1.7  --     Liver Function Tests: Recent Labs  Lab 05/08/20 2334 05/09/20 0519  AST 65* 44*  ALT 122* 92*  ALKPHOS 188* 135*  BILITOT 1.0 0.7  PROT 6.5 5.0*  ALBUMIN 3.4* 2.4*   No results for input(s): LIPASE, AMYLASE in the last 168 hours. No results for input(s): AMMONIA in the last 168 hours.  ABG    Component Value Date/Time   HCO3 24.3 05/09/2020 0519   TCO2 25 08/15/2016 1234   ACIDBASEDEF 0.1 05/09/2020 0519   O2SAT 95.9 05/09/2020 0519     Coagulation Profile: Recent Labs  Lab 05/08/20 2334  INR 1.0    Cardiac Enzymes: No results for input(s): CKTOTAL, CKMB, CKMBINDEX, TROPONINI in the last 168 hours.  HbA1C: Hgb A1c MFr Bld  Date/Time Value Ref Range Status  02/10/2019 08:41 AM 6.1 (H) 4.8 - 5.6 % Final    Comment:    (NOTE) Pre diabetes:          5.7%-6.4% Diabetes:              >6.4% Glycemic control for   <7.0% adults with diabetes   08/15/2016 06:57 PM 4.6 (L) 4.8 - 5.6 % Final    Comment:    (NOTE)         Pre-diabetes: 5.7 - 6.4         Diabetes: >6.4         Glycemic control for adults with diabetes: <  7.0     CBG: No results for input(s): GLUCAP in the last 168 hours.     Past Medical History:  She,  has a past medical history of Anxiety, Breast cancer ([place]), Breast cancer ([place]), Family history of breast cancer, Family history of colon cancer, Malignant neoplasm of  upper-outer quadrant of left female breast ([place]) (01/31/2016), Neuromyelitis optica ([place]), Personal history of chemotherapy, and Personal history of radiation therapy.   Surgical History:   Past Surgical History:  Procedure Laterality Date  . ABDOMINAL HYSTERECTOMY     partial  . BREAST BIOPSY Left 2017  . BREAST LUMPECTOMY Left    2018  . BREAST LUMPECTOMY WITH RADIOACTIVE SEED AND SENTINEL LYMPH NODE BIOPSY Left 07/31/2016   Procedure: LEFT BREAST RADIOACTIVE SEED GUIDED LUMPECTOMY WITH LEFT RADIOACTIVE SEED TARGETED AXILLARY SENTINEL LYMPH NODE EXCISION AND SENTINEL LYMPH NODE BIOPSY;  Surgeon: Rolm Bookbinder, MD;  Location: [place];  Service: General;  Laterality: Left;  . BREAST REDUCTION SURGERY Bilateral 08/05/2016   Procedure: LEFT ONCOPLASTIC REDUCTION; RIGHT BREAST REDUCTION;  Surgeon: Irene Limbo, MD;  Location: [place];  Service: Plastics;  Laterality: Bilateral;  . EYE SURGERY    . PORT-A-CATH REMOVAL Right 07/31/2016   Procedure: REMOVAL PORT-A-CATH;  Surgeon: Rolm Bookbinder, MD;  Location: [place];  Service: General;  Laterality: Right;  . PORTACATH PLACEMENT Right 02/11/2016   Procedure: INSERTION PORT-A-CATH WITH [place];  Surgeon: Rolm Bookbinder, MD;  Location: [place];  Service: General;  Laterality: Right;  . REDUCTION MAMMAPLASTY       Social History:   reports that she quit smoking about 18 years ago. Her smoking use included cigarettes. She has never used smokeless tobacco. She reports current alcohol use. She reports that she does not use drugs.   Family History:  Her family history includes Breast cancer in her cousin and maternal grandmother; CAD in her paternal grandfather and paternal grandmother; COPD in her maternal grandfather; Colon cancer in her paternal uncle; Lung cancer in her paternal uncle.   Allergies Allergies  Allergen Reactions  . Hydrocodone Other (See Comments)     Did not help with pain and kept her up all night     Home Medications  Prior to Admission medications   Medication Sig Start Date End Date Taking? Authorizing Provider  acetaminophen (TYLENOL) 500 MG tablet Take 1,000 mg by mouth every 6 (six) hours as needed for fever or mild pain.    [provider]  albuterol (VENTOLIN HFA) 108 (90 Base) MCG/ACT inhaler Inhale 2 puffs into the lungs every 4 (four) hours as needed for wheezing or shortness of breath. 02/10/20   Lauraine Rinne, NP  aspirin 81 MG tablet Take 1 tablet (81 mg total) by mouth daily. 03/30/17   Magrinat, Virgie Dad, MD  azaTHIOprine (IMURAN) 50 MG tablet Take 1 tablet (50 mg total) by mouth daily. 05/07/20   Mannam, Hart Robinsons, MD  benzonatate (TESSALON) 200 MG capsule Take 1 capsule (200 mg total) by mouth in the morning, at noon, and at bedtime. 03/22/20   Marshell Garfinkel, MD  Cholecalciferol 25 MCG (1000 UT) tablet Take 5,000 Units by mouth daily.     [provider]  feeding supplement (ENSURE ENLIVE / ENSURE PLUS) LIQD Take 237 mLs by mouth 3 (three) times daily between meals. 04/11/20   Lavina Hamman, MD  fluticasone furoate-vilanterol (BREO ELLIPTA) 200-25 MCG/INH AEPB Inhale 1 puff into the lungs daily. 03/22/20   Mannam,  Praveen, MD  gabapentin (NEURONTIN) 300 MG capsule Take 300 mg by mouth at bedtime.    [provider]  guaiFENesin (MUCINEX) 600 MG 12 hr tablet Take 600 mg by mouth 2 (two) times daily as needed for cough or to loosen phlegm.    [provider]  ipratropium-albuterol (DUONEB) 0.5-2.5 (3) MG/3ML SOLN Take 3 mLs by nebulization every 6 (six) hours as needed. 05/07/20   Mannam, Hart Robinsons, MD  levofloxacin (LEVAQUIN) 750 MG tablet Take 1 tablet (750 mg total) by mouth daily. 04/19/20   Mannam, Hart Robinsons, MD  Melatonin 10 MG TABS Take 20 mg by mouth at bedtime.    [provider]  predniSONE (DELTASONE) 10 MG tablet Take 60 mg daily for 7 days,Take 50mg  daily for 7 days,Take 40mg   daily till seen in pulmonary clinic. 04/11/20   Lavina Hamman, MD  sulfamethoxazole-trimethoprim (BACTRIM DS) 800-160 MG tablet Take 1 tablet by mouth 3 (three) times a week. Patient taking differently: Take 1 tablet by mouth 3 (three) times a week. Mon, Wed, Fri 03/23/20   Marshell Garfinkel, MD  vitamin C (VITAMIN C) 500 MG tablet Take 1 tablet (500 mg total) by mouth daily. 02/14/19   Barton Dubois, MD  zinc sulfate 220 (50 Zn) MG capsule Take 1 capsule (220 mg total) by mouth daily. Patient taking differently: Take 50 mg by mouth daily. 02/14/19   Barton Dubois, MD  pantoprazole (PROTONIX) 40 MG tablet Take 1 tablet (40 mg total) by mouth daily. 02/13/19 02/27/19  Barton Dubois, MD      The patient is critically ill with multiple organ systems failure and requires high complexity decision making for assessment and support, frequent evaluation and titration of therapies, application of advanced monitoring technologies and extensive interpretation of multiple databases. Critical Care Time devoted to patient care services described in this note is 40 minutes.    Christinia Gully, MD Pulmonary and [place] [phone]   After 7:00 pm call Elink  [phone]

## 2020-05-09 NOTE — H&P (Addendum)
TRH H&P    Patient Demographics:    Grace Moyer, is a 60 y.o. female  MRN: [id number]  DOB - 1960/05/15  Admit Date - 05/08/2020  Referring MD/NP/PA: [place]  Outpatient Primary MD for the patient is Sharilyn Sites, MD  Patient coming from: [facility]  Chief complaint- Dyspnea   HPI:    Grace Moyer  is a 60 y.o. female, with personal history of radiation, chemotherapy, lumpectomy, neuromyelitis optica, cryptogenic organizing pneumonia, and more presents to the ER with a chief complaint of dyspnea.  Since patient first was diagnosed with Covid in 2020 she has had a decline in her pulmonary health.  Patient most recently has been on 5 L nasal cannula at rest and 7 L nasal cannula with any exertion at home.  Today she could not get her oxygen saturations up past 85% even on 10 L nasal cannula at rest.  Her heart rate was 150.  She was not having coughing spells, they report that she is coughed less lately than in the past.  She did feel feverish and chilled, and she did not have chest pain.  Patient reports no rhinorrhea, no body aches, no sore throat.  Patient does have a history of DVT and PE in 2018 that were provoked, and she was on Xarelto for 6 months after that.  She had a PE study 1 month ago that was negative.  We discussed cost benefit ratio for PE study today, and patient feels that she does not have the same sharp pleuritic pain or tightness in the back of her legs that she had when she had a DVT and PE, so she wants to put this CT PE scan off and see if her saturations and tachycardia corrected with current treatments.  Patient does report that she has been immobile at home due to dyspnea.  She is also had a 30 pound weight loss in the last several months, due to dyspnea.  Patient reports that she is hungry, but she gets so short of breath when she tries to eat that she loses interest in food.  Patient has no other  complaints at this time.  Yesterday patient was seen in pulmonology office.  They are still considering cryptogenic organizing pneumonia, possible opportunistic infections.  They are not doing a bronchoscopy because she is not stable enough per the note.  They noted that she had failed prednisone outpatient, so they started her on Imuran, azathioprine, told her to continue prednisone, and continue duo nebs.  He also advised her if she had any kind of fever or reaction to the azathioprine to come straight to the ER.  Patient quit smoking in 2001, she does not drink alcohol, she does not use illicit drugs.  Patient is still full code.  In the ED Temp 101.6, heart rate 148, respiratory rate 27-35, blood pressure 125/86, saturating at 96% on 13 L high flow nasal cannula No leukocytosis with a white blood cell count of 8.5, hemoglobin 13.5 Chemistry panel is unremarkable aside from a hyperglycemia at 226 Chest x-ray  shows a patchy lower lobe predominant opacities have increased from prior exam possible organizing pneumonia post Covid EKG shows a heart rate of 150, sinus tach, QTC 493 Lactic acid was elevated at 2.9 Patient was given Tylenol and then ibuprofen in an effort to bring her fever down, she was started on Rocephin and Zithromax She is visibly anxious so she was given 1 mg Ativan Blood cultures urine cultures pending She did have a transaminitis as well with an AST of 65 and ALT of 122 Admission requested for further management of acute hypoxic respiratory failure    Review of systems:    In addition to the HPI above,  Admits to fever and chills No Headache, No changes with Vision or hearing, No problems swallowing food or Liquids, No Chest pain, admits to shortness of breath No Abdominal pain, No Nausea or Vomiting, bowel movements are regular, admits to feeling bloated No Blood in stool or Urine, No dysuria, Admits to bruising on belly from heparin shots No new joints  pains-aches,  No new weakness, tingling, numbness in any extremity, No recent weight gain or loss, No polyuria, polydypsia or polyphagia, No significant Mental Stressors.  All other systems reviewed and are negative.    Past History of the following :    Past Medical History:  Diagnosis Date  . Anxiety   . Breast cancer ([facility])   . Breast cancer ([place])   . Family history of breast cancer   . Family history of colon cancer   . Malignant neoplasm of upper-outer quadrant of left female breast ([facility]) 01/31/2016  . Neuromyelitis optica ([place])   . Personal history of chemotherapy   . Personal history of radiation therapy       Past Surgical History:  Procedure Laterality Date  . ABDOMINAL HYSTERECTOMY     partial  . BREAST BIOPSY Left 2017  . BREAST LUMPECTOMY Left    2018  . BREAST LUMPECTOMY WITH RADIOACTIVE SEED AND SENTINEL LYMPH NODE BIOPSY Left 07/31/2016   Procedure: LEFT BREAST RADIOACTIVE SEED GUIDED LUMPECTOMY WITH LEFT RADIOACTIVE SEED TARGETED AXILLARY SENTINEL LYMPH NODE EXCISION AND SENTINEL LYMPH NODE BIOPSY;  Surgeon: Rolm Bookbinder, MD;  Location: [facility];  Service: General;  Laterality: Left;  . BREAST REDUCTION SURGERY Bilateral 08/05/2016   Procedure: LEFT ONCOPLASTIC REDUCTION; RIGHT BREAST REDUCTION;  Surgeon: Irene Limbo, MD;  Location: [facility];  Service: Plastics;  Laterality: Bilateral;  . EYE SURGERY    . PORT-A-CATH REMOVAL Right 07/31/2016   Procedure: REMOVAL PORT-A-CATH;  Surgeon: Rolm Bookbinder, MD;  Location: [facility];  Service: General;  Laterality: Right;  . PORTACATH PLACEMENT Right 02/11/2016   Procedure: INSERTION PORT-A-CATH WITH [place];  Surgeon: Rolm Bookbinder, MD;  Location: [facility];  Service: General;  Laterality: Right;  . REDUCTION MAMMAPLASTY        Social History:      Social History   Tobacco Use  . Smoking status: Former Smoker    Types: Cigarettes     Quit date: 09/03/2001    Years since quitting: 18.6  . Smokeless tobacco: Never Used  Substance Use Topics  . Alcohol use: Yes    Comment: social       Family History :     Family History  Problem Relation Age of Onset  . Breast cancer Maternal Grandmother        dx in her 104s  . CAD Paternal Grandmother   . Colon cancer Paternal Uncle  dx in his 61s  . COPD Maternal Grandfather   . CAD Paternal Grandfather   . Breast cancer Cousin        dx in her early to mid 60s; maternal first cousin  . Lung cancer Paternal Uncle       Home Medications:   Prior to Admission medications   Medication Sig Start Date End Date Taking? Authorizing Provider  acetaminophen (TYLENOL) 500 MG tablet Take 1,000 mg by mouth every 6 (six) hours as needed for fever or mild pain.    [provider]  albuterol (VENTOLIN HFA) 108 (90 Base) MCG/ACT inhaler Inhale 2 puffs into the lungs every 4 (four) hours as needed for wheezing or shortness of breath. 02/10/20   Lauraine Rinne, NP  aspirin 81 MG tablet Take 1 tablet (81 mg total) by mouth daily. 03/30/17   Magrinat, Virgie Dad, MD  azaTHIOprine (IMURAN) 50 MG tablet Take 1 tablet (50 mg total) by mouth daily. 05/07/20   Mannam, Hart Robinsons, MD  benzonatate (TESSALON) 200 MG capsule Take 1 capsule (200 mg total) by mouth in the morning, at noon, and at bedtime. 03/22/20   Marshell Garfinkel, MD  Cholecalciferol 25 MCG (1000 UT) tablet Take 5,000 Units by mouth daily.     [provider]  feeding supplement (ENSURE ENLIVE / ENSURE PLUS) LIQD Take 237 mLs by mouth 3 (three) times daily between meals. 04/11/20   Lavina Hamman, MD  fluticasone furoate-vilanterol (BREO ELLIPTA) 200-25 MCG/INH AEPB Inhale 1 puff into the lungs daily. 03/22/20   Mannam, Hart Robinsons, MD  gabapentin (NEURONTIN) 300 MG capsule Take 300 mg by mouth at bedtime.    [provider]  guaiFENesin (MUCINEX) 600 MG 12 hr tablet Take 600 mg by mouth 2 (two) times daily as  needed for cough or to loosen phlegm.    [provider]  ipratropium-albuterol (DUONEB) 0.5-2.5 (3) MG/3ML SOLN Take 3 mLs by nebulization every 6 (six) hours as needed. 05/07/20   Mannam, Hart Robinsons, MD  levofloxacin (LEVAQUIN) 750 MG tablet Take 1 tablet (750 mg total) by mouth daily. 04/19/20   Mannam, Hart Robinsons, MD  Melatonin 10 MG TABS Take 20 mg by mouth at bedtime.    [provider]  predniSONE (DELTASONE) 10 MG tablet Take 60 mg daily for 7 days,Take 50mg  daily for 7 days,Take 40mg  daily till seen in pulmonary clinic. 04/11/20   Lavina Hamman, MD  sulfamethoxazole-trimethoprim (BACTRIM DS) 800-160 MG tablet Take 1 tablet by mouth 3 (three) times a week. Patient taking differently: Take 1 tablet by mouth 3 (three) times a week. Mon, Wed, Fri 03/23/20   Marshell Garfinkel, MD  vitamin C (VITAMIN C) 500 MG tablet Take 1 tablet (500 mg total) by mouth daily. 02/14/19   Barton Dubois, MD  zinc sulfate 220 (50 Zn) MG capsule Take 1 capsule (220 mg total) by mouth daily. Patient taking differently: Take 50 mg by mouth daily. 02/14/19   Barton Dubois, MD  pantoprazole (PROTONIX) 40 MG tablet Take 1 tablet (40 mg total) by mouth daily. 02/13/19 02/27/19  Barton Dubois, MD     Allergies:     Allergies  Allergen Reactions  . Hydrocodone Other (See Comments)    Did not help with pain and kept her up all night     Physical Exam:   Vitals  Blood pressure 125/86, pulse (!) 148, temperature (!) 101.6 F (38.7 C), temperature source Oral, resp. rate (!) 33, height 5\' 3"  (1.6 m), weight 68.4 kg,  SpO2 98 %.  1.  General: Patient lying supine in bed with head of bed elevated, chronically ill-appearing  2. Psychiatric: Mood and behavior normal for situation, pleasant, cooperative with exam, alert and oriented x3  3. Neurologic: Face is symmetric, speech and language are normal, moves all 4 extremities voluntarily, no focal deficit on limited exam  4. HEENMT:  Head is atraumatic,  normocephalic, pupils reactive to light, neck is supple, trachea is midline, mucous membranes are moist  5. Respiratory : Diminished breath sounds in the lower lung fields bilaterally, minimal wheeze on the right more than left, no crackles or rhonchi, no cyanosis  6. Cardiovascular : Heart rate is tachycardic, rhythm is regular, no murmurs rubs or gallops, no peripheral edema  7. Gastrointestinal:  Abdomen is mildly distended, no tenderness to palpation, no masses palpated, bowel sounds active  8. Skin:  Skin is warm dry and intact in no acute lesion on limited exam  9.Musculoskeletal:  No calf tenderness, no popliteal fossa calf tenderness, no acute deformity, no peripheral edema    Data Review:    CBC Recent Labs  Lab 05/07/20 0943 05/08/20 2334  WBC 12.6* 8.5  HGB 13.1 13.5  HCT 39.4 42.2  PLT 195.0 175  MCV 85.5 88.5  MCH  --  28.3  MCHC 33.4 32.0  RDW 18.9* 16.8*  LYMPHSABS  --  0.2*  MONOABS  --  0.1  EOSABS  --  0.0  BASOSABS  --  0.0   ------------------------------------------------------------------------------------------------------------------  Results for orders placed or performed during the hospital encounter of 05/08/20 (from the past 48 hour(s))  Lactic acid, plasma     Status: Abnormal   Collection Time: 05/08/20 11:34 PM  Result Value Ref Range   Lactic Acid, Venous 2.9 (HH) 0.5 - 1.9 mmol/L    Comment: CRITICAL RESULT CALLED TO, READ BACK BY AND VERIFIED WITH: K NICHOLS,RN@[id number]  05/08/20 MKELLY Performed at [facility], [address] [place], [place] [address]   Comprehensive metabolic panel     Status: Abnormal   Collection Time: 05/08/20 11:34 PM  Result Value Ref Range   Sodium 133 (L) 135 - 145 mmol/L   Potassium 3.9 3.5 - 5.1 mmol/L   Chloride 96 (L) 98 - 111 mmol/L   CO2 24 22 - 32 mmol/L   Glucose, Bld 226 (H) 70 - 99 mg/dL    Comment: Glucose reference range applies only to samples taken after fasting for at least 8 hours.   BUN  18 6 - 20 mg/dL   Creatinine, Ser 0.58 0.44 - 1.00 mg/dL   Calcium 8.6 (L) 8.9 - 10.3 mg/dL   Total Protein 6.5 6.5 - 8.1 g/dL   Albumin 3.4 (L) 3.5 - 5.0 g/dL   AST 65 (H) 15 - 41 U/L   ALT 122 (H) 0 - 44 U/L   Alkaline Phosphatase 188 (H) 38 - 126 U/L   Total Bilirubin 1.0 0.3 - 1.2 mg/dL   GFR, Estimated >60 >60 mL/min    Comment: (NOTE) Calculated using the CKD-EPI Creatinine Equation (2021)    Anion gap 13 5 - 15    Comment: Performed at [facility], [address] [place], [place] [address]  CBC WITH DIFFERENTIAL     Status: Abnormal   Collection Time: 05/08/20 11:34 PM  Result Value Ref Range   WBC 8.5 4.0 - 10.5 K/uL   RBC 4.77 3.87 - 5.11 MIL/uL   Hemoglobin 13.5 12.0 - 15.0 g/dL   HCT 42.2 36.0 -  46.0 %   MCV 88.5 80.0 - 100.0 fL   MCH 28.3 26.0 - 34.0 pg   MCHC 32.0 30.0 - 36.0 g/dL   RDW 16.8 (H) 11.5 - 15.5 %   Platelets 175 150 - 400 K/uL   nRBC 0.2 0.0 - 0.2 %   Neutrophils Relative % 93 %   Neutro Abs 7.9 (H) 1.7 - 7.7 K/uL   Lymphocytes Relative 3 %   Lymphs Abs 0.2 (L) 0.7 - 4.0 K/uL   Monocytes Relative 2 %   Monocytes Absolute 0.1 0.1 - 1.0 K/uL   Eosinophils Relative 0 %   Eosinophils Absolute 0.0 0.0 - 0.5 K/uL   Basophils Relative 0 %   Basophils Absolute 0.0 0.0 - 0.1 K/uL   Immature Granulocytes 2 %   Abs Immature Granulocytes 0.17 (H) 0.00 - 0.07 K/uL    Comment: Performed at [facility], [address] [place], [place] [address]  Protime-INR     Status: None   Collection Time: 05/08/20 11:34 PM  Result Value Ref Range   Prothrombin Time 13.0 11.4 - 15.2 seconds   INR 1.0 0.8 - 1.2    Comment: (NOTE) INR goal varies based on device and disease states. Performed at [facility], [address], [place], [place] [address]   APTT     Status: None   Collection Time: 05/08/20 11:34 PM  Result Value Ref Range   aPTT 25 24 - 36 seconds    Comment: Performed at [facility], [address], [place], [place] [address]    Chemistries  Recent  Labs  Lab 05/08/20 2334  NA 133*  K 3.9  CL 96*  CO2 24  GLUCOSE 226*  BUN 18  CREATININE 0.58  CALCIUM 8.6*  AST 65*  ALT 122*  ALKPHOS 188*  BILITOT 1.0   ------------------------------------------------------------------------------------------------------------------  ------------------------------------------------------------------------------------------------------------------ GFR: Estimated Creatinine Clearance: 71.2 mL/min (by C-G formula based on SCr of 0.58 mg/dL). Liver Function Tests: Recent Labs  Lab 05/08/20 2334  AST 65*  ALT 122*  ALKPHOS 188*  BILITOT 1.0  PROT 6.5  ALBUMIN 3.4*   No results for input(s): LIPASE, AMYLASE in the last 168 hours. No results for input(s): AMMONIA in the last 168 hours. Coagulation Profile: Recent Labs  Lab 05/08/20 2334  INR 1.0   Cardiac Enzymes: No results for input(s): CKTOTAL, CKMB, CKMBINDEX, TROPONINI in the last 168 hours. BNP (last 3 results) No results for input(s): PROBNP in the last 8760 hours. HbA1C: No results for input(s): HGBA1C in the last 72 hours. CBG: No results for input(s): GLUCAP in the last 168 hours. Lipid Profile: No results for input(s): CHOL, HDL, LDLCALC, TRIG, CHOLHDL, LDLDIRECT in the last 72 hours. Thyroid Function Tests: No results for input(s): TSH, T4TOTAL, FREET4, T3FREE, THYROIDAB in the last 72 hours. Anemia Panel: No results for input(s): VITAMINB12, FOLATE, FERRITIN, TIBC, IRON, RETICCTPCT in the last 72 hours.  --------------------------------------------------------------------------------------------------------------- Urine analysis:    Component Value Date/Time   COLORURINE YELLOW 02/11/2019 0025   APPEARANCEUR CLEAR 02/11/2019 0025   LABSPEC 1.020 02/11/2019 0025   PHURINE 6.0 02/11/2019 0025   GLUCOSEU NEGATIVE 02/11/2019 0025   HGBUR NEGATIVE 02/11/2019 0025   BILIRUBINUR NEGATIVE 02/11/2019 0025   KETONESUR 20 (A) 02/11/2019 0025   PROTEINUR NEGATIVE  02/11/2019 0025   UROBILINOGEN 0.2 04/08/2007 0620   NITRITE NEGATIVE 02/11/2019 0025   LEUKOCYTESUR NEGATIVE 02/11/2019 0025      Imaging Results:    DG Chest Port 1 View  Result  Date: 05/08/2020 CLINICAL DATA:  Shortness of breath. EXAM: PORTABLE CHEST 1 VIEW COMPARISON:  Radiograph and CT 04/08/2020 FINDINGS: Lung volumes are low. Upper normal heart size. Unchanged mediastinal contours. Patchy lower lobe predominant opacities that have increased from prior exam. No pneumothorax or pleural effusion. No pulmonary edema. No acute osseous abnormalities are seen. IMPRESSION: Patchy lower lobe predominant opacities have increased from prior exam. This may represent organizing pneumonia post COVID. Electronically Signed   By: Keith Rake M.D.   On: 05/08/2020 23:55    My personal review of EKG: Rhythm ST, Rate150 /min, QTc493 ,no Acute ST changes   Assessment & Plan:    Active Problems:   History of COVID-19   Transaminitis   Cryptogenic organizing pneumonia ([place])   Acute respiratory failure with hypoxia ([facility])   Lactic acidosis   Sepsis due to pneumonia ([place])   1. Acute on chronic respiratory failure with hypoxia 1. On 5-7 [place] at baseline 2. Requiring 13 HFNC 3. Increase in infiltrates on CXR 4. Given the recurrence of this pneumonia, and recent hospitalization, previously on multiple rounds of rocephin/zithromax, and doxycycline- will change Rocephin zithromax to vanc/cefepime. Continue imuran, change prednisone to solumedrol, holding azathioprine 50mg  as she could be having a reaction to this new medication of which she is only had 1 dose 5. Consult pulm in the AM 6. VBG pending 7. Continue albuterol, and DuoNeb 8. Monitor on progressive 2. Sepsis 2/2 pneumonia 1. T 101.6, HR 148, R 27-35, Lactic acidosis 2.9 2. Continue treatment as above 3. Fluids: 2 L bolus in ED, followed by 132ml/hr 3. Recurrent pneumonia and cryptogenic organizing pneumonia 1. Patient follows with  pulm.  2. She has been on bactrim TID, imuran, and steroids outpatient 3. Patient is also on rituximab  For neuromyelitis - causing her to be more susceptible to infection 4. Lactic acidosis 1. Lactic acid 2.9 5. Sinus tachycardia 1. Most likely secondary to hypoxia and infection 2. Anxiety and fever also contributing 3. History of PE in 2018 - 6 months of xarelto completed  4. We discussed CT PE study - and at this point patient would like to wait and see if hypoxia and tachycardia trend towards her normal with current treatment, and if not - consider CT PE  6. transaminitis 1. AST 65, ALT 122 2. Transaminases have been elevated in the past, but not as high as this 3. Likely 2/2 sepsis 4. Acute hepatitis panel pending for completeness 5. Hold hepatotoxic agents 7. History of Covid 19 1. 4th positive test in 18 months today 2. Has already completed treatments for covid 3. Appreciate pulm's recommendations on this as well     DVT Prophylaxis-   Heparin- SCDs   AM Labs Ordered, also please review Full Orders  Family Communication: Admission, patients condition and plan of care including tests being ordered have been discussed with the patient and husband and daughter who indicate understanding and agree with the plan and Code Status.  Code Status: Full Admission status:Inpatient :The appropriate admission status for this patient is INPATIENT. Inpatient status is judged to be reasonable and necessary in order to provide the required intensity of service to ensure the patient's safety. The patient's presenting symptoms, physical exam findings, and initial radiographic and laboratory data in the context of their chronic comorbidities is felt to place them at high risk for further clinical deterioration. Furthermore, it is not anticipated that the patient will be medically stable for discharge from the hospital within 2 midnights of  admission. The following factors support the admission  status of inpatient.     The patient's presenting symptoms include dyspnea The worrisome physical exam findings include hypoxia requiring 13 L HFNC The initial radiographic and laboratory data are worrisome because of increased opacities The chronic co-morbidities include Neuromyelitis, cryptogenic organizing pneumonia       * I certify that at the point of admission it is my clinical judgment that the patient will require inpatient hospital care spanning beyond 2 midnights from the point of admission due to high intensity of service, high risk for further deterioration and high frequency of surveillance required.*  Time spent in minutes : [place]

## 2020-05-10 DIAGNOSIS — E43 Unspecified severe protein-calorie malnutrition: Secondary | ICD-10-CM | POA: Insufficient documentation

## 2020-05-10 DIAGNOSIS — L8990 Pressure ulcer of unspecified site, unspecified stage: Secondary | ICD-10-CM | POA: Insufficient documentation

## 2020-05-10 DIAGNOSIS — J9601 Acute respiratory failure with hypoxia: Secondary | ICD-10-CM | POA: Diagnosis not present

## 2020-05-10 LAB — CBC
HCT: 29.8 % — ABNORMAL LOW (ref 36.0–46.0)
Hemoglobin: 9.9 g/dL — ABNORMAL LOW (ref 12.0–15.0)
MCH: 28.7 pg (ref 26.0–34.0)
MCHC: 33.2 g/dL (ref 30.0–36.0)
MCV: 86.4 fL (ref 80.0–100.0)
Platelets: 134 10*3/uL — ABNORMAL LOW (ref 150–400)
RBC: 3.45 MIL/uL — ABNORMAL LOW (ref 3.87–5.11)
RDW: 16.7 % — ABNORMAL HIGH (ref 11.5–15.5)
WBC: 5 10*3/uL (ref 4.0–10.5)
nRBC: 0 % (ref 0.0–0.2)

## 2020-05-10 LAB — PROCALCITONIN: Procalcitonin: 0.37 ng/mL

## 2020-05-10 LAB — LACTATE DEHYDROGENASE
LDH: 263 U/L — ABNORMAL HIGH (ref 98–192)
LDH: 277 U/L — ABNORMAL HIGH (ref 98–192)

## 2020-05-10 LAB — GLUCOSE, CAPILLARY
Glucose-Capillary: 255 mg/dL — ABNORMAL HIGH (ref 70–99)
Glucose-Capillary: 246 mg/dL — ABNORMAL HIGH (ref 70–99)
Glucose-Capillary: 407 mg/dL — ABNORMAL HIGH (ref 70–99)
Glucose-Capillary: 256 mg/dL — ABNORMAL HIGH (ref 70–99)

## 2020-05-10 LAB — LACTIC ACID, PLASMA: Lactic Acid, Venous: 1 mmol/L (ref 0.5–1.9)

## 2020-05-10 LAB — C-REACTIVE PROTEIN: CRP: 9.5 mg/dL — ABNORMAL HIGH (ref <1.0)

## 2020-05-10 LAB — MAGNESIUM: Magnesium: 1.8 mg/dL (ref 1.7–2.4)

## 2020-05-10 MED ORDER — AZATHIOPRINE 50 MG PO TABS
50.0000 mg | ORAL_TABLET | Freq: Every day | ORAL | Status: DC
Start: 2020-05-10 — End: 2020-05-15
  Administered 2020-05-10 – 2020-05-15 (×6): 50 mg via ORAL
  Filled 2020-05-10 (×7): qty 1

## 2020-05-10 MED ORDER — PANTOPRAZOLE SODIUM 40 MG PO TBEC
40.0000 mg | DELAYED_RELEASE_TABLET | Freq: Every day | ORAL | Status: DC
  Administered 2020-05-10 – 2020-05-15 (×6): 40 mg via ORAL
  Filled 2020-05-10 (×6): qty 1

## 2020-05-10 MED ORDER — IPRATROPIUM-ALBUTEROL 20-100 MCG/ACT IN AERS
2.0000 | INHALATION_SPRAY | RESPIRATORY_TRACT | Status: DC | PRN
  Administered 2020-05-11: 2 via RESPIRATORY_TRACT
  Filled 2020-05-10 (×2): qty 4

## 2020-05-10 MED ORDER — ADULT MULTIVITAMIN W/MINERALS CH
1.0000 | ORAL_TABLET | Freq: Every day | ORAL | Status: DC
  Administered 2020-05-10 – 2020-05-15 (×6): 1 via ORAL
  Filled 2020-05-10 (×6): qty 1

## 2020-05-10 MED ORDER — IPRATROPIUM-ALBUTEROL 0.5-2.5 (3) MG/3ML IN SOLN: 3.0000 mL | RESPIRATORY_TRACT | Status: DC | PRN

## 2020-05-10 MED ORDER — SULFAMETHOXAZOLE-TRIMETHOPRIM 800-160 MG PO TABS
1.0000 | ORAL_TABLET | ORAL | Status: DC
  Administered 2020-05-11 – 2020-05-14 (×2): 1 via ORAL
  Filled 2020-05-10 (×2): qty 1

## 2020-05-10 MED ORDER — INSULIN ASPART 100 UNIT/ML ~~LOC~~ SOLN
5.0000 [IU] | Freq: Once | SUBCUTANEOUS | Status: AC
  Administered 2020-05-10: 5 [IU] via SUBCUTANEOUS

## 2020-05-10 MED ORDER — INSULIN GLARGINE 100 UNIT/ML ~~LOC~~ SOLN
10.0000 [IU] | Freq: Every day | SUBCUTANEOUS | Status: DC
  Administered 2020-05-10 – 2020-05-13 (×4): 10 [IU] via SUBCUTANEOUS
  Filled 2020-05-10 (×5): qty 0.1

## 2020-05-10 NOTE — Progress Notes (Signed)
Patient transferred to room 5W29 from room 5W09 due to no longer needing isolation precautions for COVID. Daughter and belongings transferred with patient. Alert and oriented.

## 2020-05-10 NOTE — Consult Note (Signed)
NAME:  Grace Moyer, MRN:  [id number], DOB:  05-25-1960, LOS: 1 ADMISSION DATE:  05/08/2020, CONSULTATION DATE:  05/09/20 REFERRING MD:  Manuella Ghazi, [facility]  CHIEF COMPLAINT:  resp failure post covid   Brief History:  86 yowf remote smoker on Rituxan x 2017 from [facility] for neuromyelitis optica with ? Recurrent (vs persistent) covid 19 pna vs organizing pna seen in office 3/7 by Dr Vaughan Browner and rec wean pred and start Imuran but misunderstood instructions and stopped pred 3/7 from a dose of 20 mg to zero (had taken Prednisone 60 mg day prior ) and acutely deteriorated pm 3/8 with fever and sob and readmitted with acute on chronic resp failure and PCCM consulted am 3/9   History of Present Illness:  60 y.o. female, with personal history of radiation, chemotherapy, lumpectomy, neuromyelitis optica, cryptogenic organizing pneumonia, and more presented to the ER  Pm 3/8 with a chief complaint of dyspnea.  Since patient first was diagnosed with Covid in 2020 she has had a decline in her pulmonary health.  Patient most recently has been on 5 L nasal cannula at rest and 7 L nasal cannula with any exertion at home.  Today she could not get her oxygen saturations up past 85% even on 10 L nasal cannula at rest.  Her heart rate was 150.  She was not having coughing spells, they report that she is coughed less lately than in the past.  She did feel feverish and chilled but she did not have chest pain.  Patient reported no rhinorrhea, no body aches, no sore throat.  Patient does have a history of DVT and PE in 2018 that were provoked, and she was on Xarelto for 6 months after that.  She had a PE study 1 month PTA  that was negative.    Patient does report that she has been immobile at home due to dyspnea.  She is also had a 30 pound weight loss in the last several months, due to dyspnea. Patient reports that she is hungry, but she gets so short of breath when she tries to eat that she loses interest in food.  Patient has no  other complaints at this time.   patient was seen in pulmonology office 3/7 by Dr Vaughan Browner with following recs:   Recurrent post Covid pneumonia Cryptogenic organizing pneumonia  She has had at 3 episodes of COVID-19 infection so far and persistent respiratory failure with lung infiltrates in the interim which is suspected to be COP.  She has also been treated multiple times for bacterial infection with antibiotics. Patient is on rituximab for neuromyelitis optica which makes her more susceptible to infection.  Currently on prednisone at 60 mg with significant dyspnea.  As she has failed prednisone we can consider alternate agent such as Rituxan, CellCept Ideally would like to do a bronchoscope to rule out opportunistic infections, fungal infections but I do not think she is in a state to tolerate a bronch with significant dyspnea and oxygen requirements.    rec >>>   start her on Imuran at 50 mg, check TPMT levels, serum Fungitell and Aspergillus antigen If any of the latter tests are positive then we can consider bronch with understanding that she is at high risk for respiratory failure during the procedure  Plan/Recommendations: Continue prednisone at 60 mg, continue Bactrim prophylaxis Start azathioprine at 50 mg Checking TPMT, serum Fungitell, Aspergillus antigen Continue supplemental oxygen with 2 L concentrator Nebulizers and duo nebs.  In the ED Temp 101.6, heart rate 148, respiratory rate 27-35, blood pressure 125/86, saturating at 96% on 13 L high flow nasal cannula No leukocytosis with a white blood cell count of 8.5, hemoglobin 13.5 Chemistry panel  unremarkable aside from a hyperglycemia at 226 Chest x-ray showed a patchy lower lobe predominant opacities have increased from prior exam possible organizing pneumonia post Covid EKG shows a heart rate of 150, sinus tach, QTC 493 Lactic acid was elevated at 2.9 Patient was given Tylenol and then ibuprofen in an effort to  bring her fever down, she was started on Rocephin and Zithromax She is visibly anxious so she was given 1 mg Ativan  She did have a transaminitis as well with an AST of 65 and ALT of 122 Admission requested for further management of acute hypoxic respiratory failure    Past Medical History:   Anxiety  Breast cancer Breast cancer  Neuromyelitis optica   history of chemotherapy  history of radiation therapy.   Significant Hospital Events:    Consults:  PCCM 3/9   Procedures:    Significant Diagnostic Tests:    Micro Data:  Resp Panel  PCR 3/8 > neg flu/ POS COVID 19 MRSA PCR  3/9 neg  BC x 2  3/9 >>>    Antimicrobials:  Zmax 3/8 Cefepime 3/8 Rocephin 3/8 Vanc 3/8    Scheduled Meds: . ascorbic acid  500 mg Oral Daily  . aspirin  81 mg Oral Daily  . Chlorhexidine Gluconate Cloth  6 each Topical Daily  . feeding supplement  237 mL Oral TID BM  . fluticasone furoate-vilanterol  1 puff Inhalation Daily  . gabapentin  300 mg Oral QHS  . heparin  5,000 Units Subcutaneous Q8H  . insulin aspart  0-15 Units Subcutaneous TID WC  . insulin aspart  0-5 Units Subcutaneous QHS  . melatonin  6 mg Oral QHS  . methylPREDNISolone (SOLU-MEDROL) injection  60 mg Intravenous Q6H  . multivitamin with minerals  1 tablet Oral Daily  . [START ON 05/11/2020] sulfamethoxazole-trimethoprim  1 tablet Oral Once per day on Mon Wed Fri  . zinc sulfate  220 mg Oral Daily   Continuous Infusions: . ceFEPime (MAXIPIME) IV 2 g (05/10/20 1249)   PRN Meds:.guaiFENesin, Ipratropium-Albuterol, simethicone, sodium chloride   Interim History / Subjective:  Remains on 10 lpm at rest  Denies cough or sputum production. Denies fevers or chills.     Objective   Blood pressure 124/77, pulse 86, temperature 98.5 F (36.9 C), temperature source Oral, resp. rate (!) 28, height 5\' 3"  (1.6 m), weight 69.1 kg, SpO2 98 %.          Intake/Output Summary (Last 24 hours) at 05/10/2020 1312 Last data  filed at 05/10/2020 0600 Gross per 24 hour  Intake 1460 ml  Output 1 ml  Net 1459 ml   Filed Weights   05/08/20 2323 05/09/20 0300  Weight: 68.4 kg 69.1 kg   Examination: General: adult woman, mild respiratory distress, resting in bed HENT: [place]/AT, moist mucous membranes Lungs: diminished breath sounds. Bibasilar crackles. Unable to talk in full sentences.  Cardiovascular: RRR, no MRG Abdomen: soft, non-tender, active bowel sounds  Extremities: warm, no edema Neuro: alert, oriented, follows commands  GU: deferred  Resolved Hospital Problem list      Assessment & Plan:   Acute on chronic hypoxemic resp failure  - In setting of repeat covid 19 infection, organizing pneumonia and possible superimposed opportunistic infection.  - Upon review  of her CT Chests 08/15/2016, 02/10/2019, 02/27/2019, 09/04/2019, 01/27/2020, 04/08/2020 she has had parenchymal abnormalities dating back to 2018 when she was suspected to have PE at that time. Based on this timeline it raises the concern if Rituximab could be leading to a pneumonitis like reaction - ANA was + in 2017 with homogenous pattern  P: - Continue IV solumedrol and bactrim prophylaxis - Continue imuran 50mg  daily - Will check extended respiratory viral PCR - Will check sars cov antibody titer. If undetectable then will consider giving monoclonal antibody infusion.  - Check ANA - Continue to wean supplemental oxygen as able, goal O2 saturation of 90-92% - Will try to perform bronchoscopy with BAL once her oxygen requirement is below 6L.  - Pending fungitell lab. Aspergillus ag negative.  - Continue cefepime for now  Best practice (evaluated daily)  Diet: per [facility] Pain/Anxiety/Delirium protocol (if indicated): per [facility] VAP protocol (if indicated): DVT prophylaxis: per [facility] GI prophylaxis: per [facility] Glucose control: n/a Mobility: up as tol Disposition:to be transferred to [facility] / telemetry today if bed available Code Status: full  code  Labs   CBC: Recent Labs  Lab 05/07/20 0943 05/08/20 2334 05/09/20 0519 05/10/20 0305  WBC 12.6* 8.5 4.7 5.0  NEUTROABS  --  7.9*  --   --   HGB 13.1 13.5 10.3* 9.9*  HCT 39.4 42.2 33.4* 29.8*  MCV 85.5 88.5 90.8 86.4  PLT 195.0 175 132* 134*    Basic Metabolic Panel: Recent Labs  Lab 05/08/20 2334 05/09/20 0519 05/09/20 1215 05/10/20 0305  NA 133* 137 133*  --   K 3.9 3.5 3.6  --   CL 96* 105 101  --   CO2 24 23 22   --   GLUCOSE 226* 224* 343*  --   BUN 18 14 15   --   CREATININE 0.58 0.47 0.40*  --   CALCIUM 8.6* 7.7* 8.1*  --   MG  --   --   --  1.8   GFR: Estimated Creatinine Clearance: 71.5 mL/min (A) (by C-G formula based on SCr of 0.4 mg/dL (L)). Recent Labs  Lab 05/07/20 0943 05/08/20 2334 05/09/20 0044 05/09/20 0519 05/10/20 0305  PROCALCITON  --   --   --  0.16 0.37  WBC 12.6* 8.5  --  4.7 5.0  LATICACIDVEN  --  2.9* 1.7  --  1.0    Liver Function Tests: Recent Labs  Lab 05/08/20 2334 05/09/20 0519 05/09/20 1215  AST 65* 44* 39  ALT 122* 92* 97*  ALKPHOS 188* 135* 148*  BILITOT 1.0 0.7 0.6  PROT 6.5 5.0* 5.3*  ALBUMIN 3.4* 2.4* 2.6*   No results for input(s): LIPASE, AMYLASE in the last 168 hours. No results for input(s): AMMONIA in the last 168 hours.  ABG    Component Value Date/Time   HCO3 24.3 05/09/2020 0519   TCO2 25 08/15/2016 1234   ACIDBASEDEF 0.1 05/09/2020 0519   O2SAT 95.9 05/09/2020 0519     Coagulation Profile: Recent Labs  Lab 05/08/20 2334  INR 1.0    Cardiac Enzymes: No results for input(s): CKTOTAL, CKMB, CKMBINDEX, TROPONINI in the last 168 hours.  HbA1C: Hgb A1c MFr Bld  Date/Time Value Ref Range Status  05/08/2020 11:34 PM 7.9 (H) 4.8 - 5.6 % Final    Comment:    (NOTE) Pre diabetes:          5.7%-6.4%  Diabetes:              >  6.4%  Glycemic control for   <7.0% adults with diabetes   02/10/2019 08:41 AM 6.1 (H) 4.8 - 5.6 % Final    Comment:    (NOTE) Pre diabetes:           5.7%-6.4% Diabetes:              >6.4% Glycemic control for   <7.0% adults with diabetes     CBG: Recent Labs  Lab 05/09/20 1239 05/09/20 1703 05/09/20 2007 05/10/20 0727 05/10/20 1230  GLUCAP 330* 246* 317* 255* 246*      Freda Jackson, MD [facility] Office: [phone]   See Amion for Pager Details

## 2020-05-10 NOTE — Progress Notes (Signed)
Initial Nutrition Assessment  DOCUMENTATION CODES:  Severe malnutrition in context of chronic illness  INTERVENTION:  Continue Ensure Enlive po TID, each supplement provides 350 kcal and 20 grams of protein.  Add Magic cup TID with meals, each supplement provides 290 kcal and 9 grams of protein.  Add MVI with minerals.  Recommend liberalizing diet to regular to promote PO intake - spoke with MD.  NUTRITION DIAGNOSIS:  Severe Malnutrition related to chronic illness (s/ p COVID, COVID PNA, and now cryptogenic organizing PNA) as evidenced by mild fat depletion,severe muscle depletion,percent weight loss,energy intake < or equal to 75% for > or equal to 1 month.  GOAL:  Patient will meet greater than or equal to 90% of their needs  MONITOR:  PO intake,Supplement acceptance,Diet advancement,Skin  REASON FOR ASSESSMENT:  Malnutrition Screening Tool   ASSESSMENT:  60 yo female with PMH of radiation, chemotherapy, lumpectomy, neuromyelitis optica, and cryptogenic organizing PNA who presents with sepsis d/t cryptogenic organizing PNA. Pt also has acute on chronic respiratory failure with hypoxia and hx of COVID-19.  Spoke with daughter and pt at bedside. Daughter is primary caregiver of pt. Daughter reports that pt has been eating "toddler-sized" portions of foods, such as oatmeal, and having up to 3 Boost High Protein shakes per day to replace what she is not able to eat. Up until a few weeks ago, she was able to eat a breakfast sandwich with bacon, egg, and cheese, but has not been able to recently. Daughter reports that her appetite and how much she is able to eat depends on her breathing ability. She will get tired while she drinks or eats and then stops so she can breathe and rest. Per Epic, pt had 50% of dinner last night. She is drinking Ensure Plus while in the hospital, but she has not been able to drink all of them.  Daughter also reports a 60 lb weight loss since 2020, when pt was  first diagnosed with COVID, and 30 lbs of this weight had fallen off in the past 2.5 months. Epic reports a 12 lbs (7.5%) weight loss in the last 2 months, which is significant. She reports her UBW as 220 lbs.  Pt is also not able to get around much at home, only to rarely shower and to use bathroom, which is difficult as her breathing is more labored and she becomes easily exhausted.  Of note, pt has not been dx with diabetes. Pt has been on long-term steroids at high doses, causing a blood sugar increase over the last few months.  Recommend continuing Ensure Enlive/Plus TID and adding Magic Cups TID to meal trays, as well as an MVI daily to promote protein and caloric intake. Also liberalizing her diet to regular would allow her more choice to increase her PO intake.  Relevant Medications: Vitamin C, gabapentin, SSI, insulin aspart, methylprednisolone, zinc sulfate 220 mg, Labs: reviewed; Glucose 263, Na 133 HbA1c: 7.9% (05/2020)  NUTRITION - FOCUSED PHYSICAL EXAM: Flowsheet Row Most Recent Value  Orbital Region Mild depletion  Upper Arm Region Mild depletion  Thoracic and Lumbar Region Mild depletion  Buccal Region Mild depletion  Temple Region Mild depletion  Clavicle Bone Region No depletion  Clavicle and Acromion Bone Region Moderate depletion  Scapular Bone Region Mild depletion  Dorsal Hand Mild depletion  Patellar Region Severe depletion  Anterior Thigh Region Severe depletion  Posterior Calf Region Severe depletion  Edema (RD Assessment) Mild  Hair Reviewed  Eyes Reviewed  [pale conjuctiva]  Mouth Reviewed  Skin Reviewed  Nails Reviewed  [pale nail beds]     Diet Order:   Diet Order            Diet regular Room service appropriate? Yes; Fluid consistency: Thin  Diet effective now                EDUCATION NEEDS:  Education needs have been addressed  Skin:  Skin Assessment: Reviewed RN Assessment (Stage 2 sacral wound)  Last BM:  05/09/20 - Type 6  Height:  Ht  Readings from Last 1 Encounters:  05/09/20 5\' 3"  (1.6 m)   Weight:  Wt Readings from Last 1 Encounters:  05/09/20 69.1 kg   Ideal Body Weight:  52.3 kg  BMI:  Body mass index is 26.99 kg/m.  Estimated Nutritional Needs:  Kcal:  1900-2100 Protein:  110-130 grams Fluid:  >2 L  Derrel Nip, RD, LDN Registered Dietitian After Hours/Weekend Pager # in [place]

## 2020-05-10 NOTE — Progress Notes (Signed)
PROGRESS NOTE                                                                             PROGRESS NOTE                                                                                                                                                                                                             Patient Demographics:    Grace Moyer, is a 60 y.o. female, DOB - 10-Jun-1960, EUM:[id number]  Outpatient Primary MD for the patient is Sharilyn Sites, MD    LOS - 1  Admit date - 05/08/2020    No chief complaint on file.      Brief Narrative    a59 y.o.female,with personal history of radiation, chemotherapy, lumpectomy, neuromyelitis optica, cryptogenic organizing pneumonia, and more who presented to the ER with a chief complaint of dyspnea.  Patient has had an overall decline in her condition from a pulmonary standpoint since she was first diagnosed with Covid in 2020.  She typically wears 4-5 L nasal cannula at home and was admitted with acute hypoxemic respiratory failure requiring 11 L nasal cannula.  She was also quite tachycardic on admission and is quite anxious as well.  She has had prior history of DVT/PE 2018 and completed her course of Xarelto.  She has also recently had CT PE study within the last month which was negative.  She has also had a 30 pound weight loss in the last several months due to her dyspnea and appears to be failing to thrive.  She was seen by her pulmonologist Dr. Vaughan Browner on 3/7 and was thought to have cryptogenic organizing pneumonia with possible opportunistic infections, but she was not stable enough for bronchoscopy.  She was started on Imuran and was told to continue her prednisone, however it appears the patient self tapered off of this.  She has been seen by pulmonology, Dr. Melvyn Novas at [facility] today who recommends transfer to [facility] for further evaluation   Subjective:    Grace Moyer  Dry today denies any fever or  chills, report generalized weakness, fatigue, shortness of breath .    Assessment  & Plan :    Active Problems:   History of COVID-19   Transaminitis   Cryptogenic organizing pneumonia ([place])   Acute respiratory failure with hypoxia (HCC)   Lactic acidosis   Sepsis due to pneumonia ([place])   Protein-calorie malnutrition, severe   Pressure injury of skin     Acute on chronic hypoxemic respiratory failure  -Sent with baseline respiratory failure with underlining organizing pneumonia, with possible super imposed opportunistic infection, and recent COVID-19 infection . -Pulmonary input greatly appreciated, continue with IV Solu-Medrol, with Bactrim prophylaxis . -Continue with Imuran 50 mg oral daily  -Wean oxygen as tolerated . -Likely will need bronchoscopy with BAL once her oxygen requirement is below 6 L  - Pending fungitell lab. Aspergillus ag negative.   Severe sepsis secondary to pneumonia-resolving -Abscess present on admission with lactic acid 2.9 tachycardic in the 150s, tachypneic with respiratory rate at 27, febrile 101.6. -Continue cefepime for now -Blood culture with no growth  Sinus tachycardia secondary to above-resolving -Continue telemetry monitoring -Appears to be improving with IV fluid hydration and improvement in dyspnea -venous Dopplers negative for DVT  Mild transaminitis -Continue to monitor in a.m.  Anemia of chronic illness  Weight loss and failure to thrive -Consider palliative evaluation  History of neuromyelitis optica -Takes rituximab at home, usually takes it every 67-month, next dose is in April.  COVID 19+ -She was diagnosed recently/08/2020 (third episode first was in November 2020, second was in August 2021, third was in February of this year), she is>21 days of her recent infection, no indication for isolation, will discontinue her isolation  SpO2: 97 % O2 Flow Rate (L/min): 7 L/min  Recent Labs  Lab 05/07/20 0943 05/08/20 2334  05/09/20 0044 05/09/20 0519 05/09/20 0739 05/09/20 1215 05/10/20 0305  WBC 12.6* 8.5  --  4.7  --   --  5.0  PLT 195.0 175  --  132*  --   --  134*  BNP  --   --  67.0  --   --   --   --   DDIMER  --   --   --   --  1.12*  --   --   PROCALCITON  --   --   --  0.16  --   --  0.37  AST  --  65*  --  44*  --  39  --   ALT  --  122*  --  92*  --  97*  --   ALKPHOS  --  188*  --  135*  --  148*  --   BILITOT  --  1.0  --  0.7  --  0.6  --   ALBUMIN  --  3.4*  --  2.4*  --  2.6*  --   INR  --  1.0  --   --   --   --   --   LATICACIDVEN  --  2.9* 1.7  --   --   --  1.0  SARSCOV2NAA  --  POSITIVE*  --   --   --   --   --        ABG     Component Value Date/Time   HCO3 24.3 05/09/2020 0519   TCO2 25 08/15/2016 1234   ACIDBASEDEF 0.1 05/09/2020 0519   O2SAT 95.9 05/09/2020 0519  Condition - Extremely Guarded  Family Communication  :  Daughter at bedside  Code Status :  Full  Consults  :  PCCM  Procedures  :  none   Disposition Plan  :    Status is: Inpatient  Remains inpatient appropriate because:IV treatments appropriate due to intensity of illness or inability to take PO   Dispo: The patient is from: Home              Anticipated d/c is to: Home              Patient currently is not medically stable to d/c.   Difficult to place patient No      DVT Prophylaxis  :   Heparin   Lab Results  Component Value Date   PLT 134 (L) 05/10/2020    Diet :  Diet Order            Diet regular Room service appropriate? Yes; Fluid consistency: Thin  Diet effective now                  Inpatient Medications  Scheduled Meds:  ascorbic acid  500 mg Oral Daily   aspirin  81 mg Oral Daily   azaTHIOprine  50 mg Oral Daily   Chlorhexidine Gluconate Cloth  6 each Topical Daily   feeding supplement  237 mL Oral TID BM   fluticasone furoate-vilanterol  1 puff Inhalation Daily   gabapentin  300 mg Oral QHS   heparin  5,000 Units Subcutaneous Q8H    insulin aspart  0-15 Units Subcutaneous TID WC   insulin aspart  0-5 Units Subcutaneous QHS   melatonin  6 mg Oral QHS   methylPREDNISolone (SOLU-MEDROL) injection  60 mg Intravenous Q6H   multivitamin with minerals  1 tablet Oral Daily   [START ON 05/11/2020] sulfamethoxazole-trimethoprim  1 tablet Oral Once per day on Mon Wed Fri   zinc sulfate  220 mg Oral Daily   Continuous Infusions:  ceFEPime (MAXIPIME) IV 2 g (05/10/20 1249)   PRN Meds:.guaiFENesin, Ipratropium-Albuterol, simethicone, sodium chloride  Antibiotics  :    Anti-infectives (From admission, onward)   Start     Dose/Rate Route Frequency Ordered Stop   05/11/20 1000  sulfamethoxazole-trimethoprim (BACTRIM DS) 800-160 MG per tablet 1 tablet       Note to Pharmacy: Patient taking differently: Mon, Wed, Fri     1 tablet Oral Once per day on Mon Wed Fri 05/10/20 0707     05/10/20 0000  vancomycin (VANCOREADY) IVPB 1500 mg/300 mL  Status:  Discontinued        1,500 mg 150 mL/hr over 120 Minutes Intravenous Every 24 hours 05/09/20 0204 05/09/20 1201   05/09/20 0400  ceFEPIme (MAXIPIME) 2 g in sodium chloride 0.9 % 100 mL IVPB        2 g 200 mL/hr over 30 Minutes Intravenous Every 8 hours 05/09/20 0204     05/09/20 0215  vancomycin (VANCOREADY) IVPB 1500 mg/300 mL        1,500 mg 150 mL/hr over 120 Minutes Intravenous  Once 05/09/20 0204 05/09/20 0450   05/08/20 2345  cefTRIAXone (ROCEPHIN) 2 g in sodium chloride 0.9 % 100 mL IVPB  Status:  Discontinued        2 g 200 mL/hr over 30 Minutes Intravenous Every 24 hours 05/08/20 2334 05/09/20 0201   05/08/20 2345  azithromycin (ZITHROMAX) 500 mg in sodium chloride 0.9 % 250 mL IVPB  Status:  Discontinued  500 mg 250 mL/hr over 60 Minutes Intravenous Every 24 hours 05/08/20 2334 05/09/20 0201        Emeline Gins Joshuah Minella M.D on 05/10/2020 at 2:04 PM  To page go to [email or web address]   [facility] -  Office  [phone]       Objective:   Vitals:    05/10/20 0815 05/10/20 1100 05/10/20 1200 05/10/20 1300  BP:  124/77  117/62  Pulse:  86 79 87  Resp:  (!) 28 (!) 27 (!) 22  Temp:  98.5 F (36.9 C)  98.4 F (36.9 C)  TempSrc:  Oral    SpO2: 94% 98% 100% 97%  Weight:      Height:        Wt Readings from Last 3 Encounters:  05/09/20 69.1 kg  05/07/20 68.4 kg  04/08/20 68 kg     Intake/Output Summary (Last 24 hours) at 05/10/2020 1404 Last data filed at 05/10/2020 0600 Gross per 24 hour  Intake 1460 ml  Output 1 ml  Net 1459 ml     Physical Exam  Awake Alert, frail, No new F.N deficits, Normal affect  Symmetrical Chest wall movement,  bibasilar crackles, diminished air entry at the bases RRR,No Gallops,Rubs or new Murmurs, No Parasternal Heave +ve B.Sounds, Abd Soft, No tenderness, No organomegaly appriciated, No rebound - guarding or rigidity. No Cyanosis, Clubbing or edema, No new Rash or bruise      Data Review:    CBC Recent Labs  Lab 05/07/20 0943 05/08/20 2334 05/09/20 0519 05/10/20 0305  WBC 12.6* 8.5 4.7 5.0  HGB 13.1 13.5 10.3* 9.9*  HCT 39.4 42.2 33.4* 29.8*  PLT 195.0 175 132* 134*  MCV 85.5 88.5 90.8 86.4  MCH  --  28.3 28.0 28.7  MCHC 33.4 32.0 30.8 33.2  RDW 18.9* 16.8* 16.9* 16.7*  LYMPHSABS  --  0.2*  --   --   MONOABS  --  0.1  --   --   EOSABS  --  0.0  --   --   BASOSABS  --  0.0  --   --     Recent Labs  Lab 05/08/20 2334 05/09/20 0044 05/09/20 0519 05/09/20 0739 05/09/20 1215 05/10/20 0305  NA 133*  --  137  --  133*  --   K 3.9  --  3.5  --  3.6  --   CL 96*  --  105  --  101  --   CO2 24  --  23  --  22  --   GLUCOSE 226*  --  224*  --  343*  --   BUN 18  --  14  --  15  --   CREATININE 0.58  --  0.47  --  0.40*  --   CALCIUM 8.6*  --  7.7*  --  8.1*  --   AST 65*  --  44*  --  39  --   ALT 122*  --  92*  --  97*  --   ALKPHOS 188*  --  135*  --  148*  --   BILITOT 1.0  --  0.7  --  0.6  --   ALBUMIN 3.4*  --  2.4*  --  2.6*  --   MG  --   --   --   --   --  1.8   DDIMER  --   --   --  1.12*  --   --  PROCALCITON  --   --  0.16  --   --  0.37  LATICACIDVEN 2.9* 1.7  --   --   --  1.0  INR 1.0  --   --   --   --   --   HGBA1C 7.9*  --   --   --   --   --   BNP  --  67.0  --   --   --   --     ------------------------------------------------------------------------------------------------------------------ No results for input(s): CHOL, HDL, LDLCALC, TRIG, CHOLHDL, LDLDIRECT in the last 72 hours.  Lab Results  Component Value Date   HGBA1C 7.9 (H) 05/08/2020   ------------------------------------------------------------------------------------------------------------------ No results for input(s): TSH, T4TOTAL, T3FREE, THYROIDAB in the last 72 hours.  Invalid input(s): FREET3  Cardiac Enzymes No results for input(s): CKMB, TROPONINI, MYOGLOBIN in the last 168 hours.  Invalid input(s): CK ------------------------------------------------------------------------------------------------------------------    Component Value Date/Time   BNP 67.0 05/09/2020 0044    Micro Results Recent Results (from the past 240 hour(s))  Resp Panel by RT-PCR (Flu A&B, Covid) Nasopharyngeal Swab     Status: Abnormal   Collection Time: 05/08/20 11:34 PM   Specimen: Nasopharyngeal Swab; Nasopharyngeal(NP) swabs in vial transport medium  Result Value Ref Range Status   SARS Coronavirus 2 by RT PCR POSITIVE (A) NEGATIVE Final    Comment: RESULT CALLED TO, READ BACK BY AND VERIFIED WITH: H SPENCE,RN@0055  05/09/20 MKELLY (NOTE) SARS-CoV-2 target nucleic acids are DETECTED.  The SARS-CoV-2 RNA is generally detectable in upper respiratory specimens during the acute phase of infection. Positive results are indicative of the presence of the identified virus, but do not rule out bacterial infection or co-infection with other pathogens not detected by the test. Clinical correlation with patient history and other diagnostic information is necessary to determine  patient infection status. The expected result is Negative.  Fact Sheet for Patients: EntrepreneurPulse.com.au  Fact Sheet for Healthcare Providers: IncredibleEmployment.be  This test is not yet approved or cleared by the [place] FDA and  has been authorized for detection and/or diagnosis of SARS-CoV-2 by FDA under an Emergency Use Authorization (EUA).  This EUA will remain in effect (meaning this test can be  used) for the duration of  the COVID-19 declaration under Section 564(b)(1) of the Act, 21 U.S.C. section 360bbb-3(b)(1), unless the authorization is terminated or revoked sooner.     Influenza A by PCR NEGATIVE NEGATIVE Final   Influenza B by PCR NEGATIVE NEGATIVE Final    Comment: (NOTE) The Xpert Xpress SARS-CoV-2/FLU/RSV plus assay is intended as an aid in the diagnosis of influenza from Nasopharyngeal swab specimens and should not be used as a sole basis for treatment. Nasal washings and aspirates are unacceptable for Xpert Xpress SARS-CoV-2/FLU/RSV testing.  Fact Sheet for Patients: EntrepreneurPulse.com.au  Fact Sheet for Healthcare Providers: IncredibleEmployment.be  This test is not yet approved or cleared by the [place] FDA and has been authorized for detection and/or diagnosis of SARS-CoV-2 by FDA under an Emergency Use Authorization (EUA). This EUA will remain in effect (meaning this test can be used) for the duration of the COVID-19 declaration under Section 564(b)(1) of the Act, 21 U.S.C. section 360bbb-3(b)(1), unless the authorization is terminated or revoked.  Performed at [facility], [address], [place], [place] [address]   Blood Culture (routine x 2)     Status: None (Preliminary result)   Collection Time: 05/09/20 12:44 AM   Specimen: BLOOD RIGHT HAND  Result Value Ref  Range Status   Specimen Description BLOOD RIGHT HAND  Final   Special Requests   Final     BOTTLES DRAWN AEROBIC AND ANAEROBIC Blood Culture results may not be optimal due to an excessive volume of blood received in culture bottles   Culture   Final    NO GROWTH 1 DAY Performed at [facility], [address], [place], [place] [address]    Report Status PENDING  Incomplete  MRSA PCR Screening     Status: None   Collection Time: 05/09/20  3:16 AM   Specimen: Nasal Mucosa; Nasopharyngeal  Result Value Ref Range Status   MRSA by PCR NEGATIVE NEGATIVE Final    Comment:        The GeneXpert MRSA Assay (FDA approved for NASAL specimens only), is one component of a comprehensive MRSA colonization surveillance program. It is not intended to diagnose MRSA infection nor to guide or monitor treatment for MRSA infections. Performed at [facility], [address], [place],  [address]     Radiology Reports US Venous Img Lower Bilateral (DVT)  Result Date: 05/09/2020 CLINICAL DATA:  Right leg pain, positive D-dimer EXAM: BILATERAL LOWER EXTREMITY VENOUS DOPPLER ULTRASOUND TECHNIQUE: Gray-scale sonography with graded compression, as well as color Doppler and duplex ultrasound were performed to evaluate the lower extremity deep venous systems from the level of the common femoral vein and including the common femoral, femoral, profunda femoral, popliteal and calf veins including the posterior tibial, peroneal and gastrocnemius veins when visible. The superficial great saphenous vein was also interrogated. Spectral Doppler was utilized to evaluate flow at rest and with distal augmentation maneuvers in the common femoral, femoral and popliteal veins. COMPARISON:  None. FINDINGS: RIGHT LOWER EXTREMITY Common Femoral Vein: No evidence of thrombus. Normal compressibility, respiratory phasicity and response to augmentation. Saphenofemoral Junction: No evidence of thrombus. Normal compressibility and flow on color Doppler imaging. Profunda Femoral Vein: No evidence of thrombus. Normal  compressibility and flow on color Doppler imaging. Femoral Vein: No evidence of thrombus. Normal compressibility, respiratory phasicity and response to augmentation. Popliteal Vein: No evidence of thrombus. Normal compressibility, respiratory phasicity and response to augmentation. Calf Veins: No evidence of thrombus. Normal compressibility and flow on color Doppler imaging. LEFT LOWER EXTREMITY Common Femoral Vein: No evidence of thrombus. Normal compressibility, respiratory phasicity and response to augmentation. Saphenofemoral Junction: No evidence of thrombus. Normal compressibility and flow on color Doppler imaging. Profunda Femoral Vein: No evidence of thrombus. Normal compressibility and flow on color Doppler imaging. Femoral Vein: No evidence of thrombus. Normal compressibility, respiratory phasicity and response to augmentation. Popliteal Vein: No evidence of thrombus. Normal compressibility, respiratory phasicity and response to augmentation. Calf Veins: No evidence of thrombus. Normal compressibility and flow on color Doppler imaging. IMPRESSION: No evidence of deep venous thrombosis in either lower extremity. Electronically Signed   By: Jerilynn Mages.  Shick M.D.   On: 05/09/2020 14:06   DG Chest Port 1 View  Result Date: 05/08/2020 CLINICAL DATA:  Shortness of breath. EXAM: PORTABLE CHEST 1 VIEW COMPARISON:  Radiograph and CT 04/08/2020 FINDINGS: Lung volumes are low. Upper normal heart size. Unchanged mediastinal contours. Patchy lower lobe predominant opacities that have increased from prior exam. No pneumothorax or pleural effusion. No pulmonary edema. No acute osseous abnormalities are seen. IMPRESSION: Patchy lower lobe predominant opacities have increased from prior exam. This may represent organizing pneumonia post COVID. Electronically Signed   By: Keith Rake M.D.   On: 05/08/2020 23:55

## 2020-05-10 NOTE — Consult Note (Signed)
[facility] Nurse Consult Note: Patient on isolation for COVID precautions. Reason for Consult: sacral stage 2 Wound type: stage 2 PI Pressure Injury POA: Yes Measurement: To be provided by the bedside RN in the flowsheet section Wound bed: pink, see photo Drainage (amount, consistency, odor) drops serous Periwound: intact Dressing procedure/placement/frequency: Using the Standing Skin Care Order Set available in the [facility] system for all licensed nurses, I have activated the sacral foam dressing order for the area. Monitor the wound area(s) for worsening of condition such as: Signs/symptoms of infection,  Increase in size,  Development of or worsening of odor, Development of pain, or increased pain at the affected locations.  Notify the medical team if any of these develop.  Thank you for the consult. [facility] nurse will not follow at this time.  Please re-consult the [place] team if needed.  Val Riles, RN, MSN, CWOCN, CNS-BC, pager [phone]

## 2020-05-10 NOTE — Progress Notes (Signed)
   05/10/20 1100  Assess: MEWS Score  Temp 98.5 F (36.9 C)  BP 124/77  Pulse Rate 86  ECG Heart Rate 87  Resp (!) 28  Level of Consciousness Alert  SpO2 98 %  O2 Device HFNC  O2 Flow Rate (L/min) 10 L/min  Assess: MEWS Score  MEWS Temp 0  MEWS Systolic 0  MEWS Pulse 0  MEWS RR 2  MEWS LOC 0  MEWS Score 2  MEWS Score Color Yellow  Assess: if the MEWS score is Yellow or Red  Were vital signs taken at a resting state? Yes  Focused Assessment Change from prior assessment (see assessment flowsheet)  Early Detection of Sepsis Score *See Row Information* Low  MEWS guidelines implemented *See Row Information* No, vital signs rechecked  Treat  Pain Scale 0-10  Pain Score 0

## 2020-05-11 DIAGNOSIS — J9601 Acute respiratory failure with hypoxia: Secondary | ICD-10-CM | POA: Diagnosis not present

## 2020-05-11 LAB — RESPIRATORY PANEL BY PCR

## 2020-05-11 LAB — URINE CULTURE: Culture: NO GROWTH

## 2020-05-11 LAB — CBC
HCT: 29.1 % — ABNORMAL LOW (ref 36.0–46.0)
Hemoglobin: 9.7 g/dL — ABNORMAL LOW (ref 12.0–15.0)
MCH: 28.8 pg (ref 26.0–34.0)
MCHC: 33.3 g/dL (ref 30.0–36.0)
MCV: 86.4 fL (ref 80.0–100.0)
Platelets: 141 10*3/uL — ABNORMAL LOW (ref 150–400)
RBC: 3.37 MIL/uL — ABNORMAL LOW (ref 3.87–5.11)
RDW: 16.6 % — ABNORMAL HIGH (ref 11.5–15.5)
WBC: 7.1 10*3/uL (ref 4.0–10.5)
nRBC: 0 % (ref 0.0–0.2)

## 2020-05-11 LAB — GLUCOSE, CAPILLARY
Glucose-Capillary: 243 mg/dL — ABNORMAL HIGH (ref 70–99)
Glucose-Capillary: 372 mg/dL — ABNORMAL HIGH (ref 70–99)
Glucose-Capillary: 436 mg/dL — ABNORMAL HIGH (ref 70–99)
Glucose-Capillary: 238 mg/dL — ABNORMAL HIGH (ref 70–99)

## 2020-05-11 LAB — HISTOPLASMA ANTIGEN, URINE: Histoplasma Antigen, urine: <0.5 (ref <0.5)

## 2020-05-11 LAB — SAR COV2 SEROLOGY (COVID19)AB(IGG),IA: SARS-CoV-2 Ab, IgG: NONREACTIVE

## 2020-05-11 LAB — BASIC METABOLIC PANEL
Anion gap: 8 (ref 5–15)
BUN: 20 mg/dL (ref 6–20)
CO2: 27 mmol/L (ref 22–32)
Calcium: 8.7 mg/dL — ABNORMAL LOW (ref 8.9–10.3)
Chloride: 102 mmol/L (ref 98–111)
Creatinine, Ser: 0.44 mg/dL (ref 0.44–1.00)
GFR, Estimated: >60 mL/min (ref >60)
Glucose, Bld: 164 mg/dL — ABNORMAL HIGH (ref 70–99)
Potassium: 3.6 mmol/L (ref 3.5–5.1)
Sodium: 137 mmol/L (ref 135–145)

## 2020-05-11 LAB — ANA W/REFLEX IF POSITIVE: Anti Nuclear Antibody (ANA): NEGATIVE

## 2020-05-11 LAB — PROCALCITONIN: Procalcitonin: 0.27 ng/mL

## 2020-05-11 LAB — HEMOGLOBIN A1C
Hgb A1c MFr Bld: 8.1 % — ABNORMAL HIGH (ref 4.8–5.6)
Mean Plasma Glucose: 185.77 mg/dL

## 2020-05-11 LAB — FUNGITELL, SERUM: Fungitell Result: <31 pg/mL

## 2020-05-11 MED ORDER — EPINEPHRINE 0.3 MG/0.3ML IJ SOAJ
0.3000 mg | Freq: Once | INTRAMUSCULAR | Status: DC | PRN
  Filled 2020-05-11: qty 0.6

## 2020-05-11 MED ORDER — ALBUTEROL SULFATE HFA 108 (90 BASE) MCG/ACT IN AERS
2.0000 | INHALATION_SPRAY | Freq: Once | RESPIRATORY_TRACT | Status: DC | PRN
  Filled 2020-05-11: qty 6.7

## 2020-05-11 MED ORDER — DIPHENHYDRAMINE HCL 50 MG/ML IJ SOLN: 50.0000 mg | Freq: Once | INTRAMUSCULAR | Status: DC | PRN

## 2020-05-11 MED ORDER — METHYLPREDNISOLONE SODIUM SUCC 125 MG IJ SOLR: 125.0000 mg | Freq: Once | INTRAMUSCULAR | Status: DC | PRN

## 2020-05-11 MED ORDER — FAMOTIDINE IN NACL 20-0.9 MG/50ML-% IV SOLN
20.0000 mg | Freq: Once | INTRAVENOUS | Status: DC | PRN
  Filled 2020-05-11: qty 50

## 2020-05-11 MED ORDER — METHYLPREDNISOLONE SODIUM SUCC 125 MG IJ SOLR
60.0000 mg | Freq: Two times a day (BID) | INTRAMUSCULAR | Status: DC
  Administered 2020-05-11 – 2020-05-13 (×4): 60 mg via INTRAVENOUS
  Filled 2020-05-11 (×4): qty 2

## 2020-05-11 MED ORDER — GLUCERNA SHAKE PO LIQD
237.0000 mL | Freq: Three times a day (TID) | ORAL | Status: DC
  Administered 2020-05-11 – 2020-05-15 (×11): 237 mL via ORAL
  Filled 2020-05-11 (×6): qty 237

## 2020-05-11 MED ORDER — SOTROVIMAB 500 MG/8ML IV SOLN
500.0000 mg | Freq: Once | INTRAVENOUS | Status: AC
  Administered 2020-05-11: 500 mg via INTRAVENOUS
  Filled 2020-05-11: qty 8

## 2020-05-11 MED ORDER — INSULIN ASPART 100 UNIT/ML ~~LOC~~ SOLN
5.0000 [IU] | Freq: Three times a day (TID) | SUBCUTANEOUS | Status: DC
  Administered 2020-05-11 – 2020-05-14 (×9): 5 [IU] via SUBCUTANEOUS

## 2020-05-11 MED ORDER — ACETAMINOPHEN 325 MG PO TABS
650.0000 mg | ORAL_TABLET | ORAL | Status: DC | PRN
  Administered 2020-05-11: 650 mg via ORAL
  Filled 2020-05-11 (×2): qty 2

## 2020-05-11 MED ORDER — SODIUM CHLORIDE 0.9 % IV SOLN: INTRAVENOUS | Status: DC | PRN

## 2020-05-11 NOTE — Progress Notes (Addendum)
NAME:  Grace Moyer, MRN:  [id number], DOB:  Dec 24, 1960, LOS: 2 ADMISSION DATE:  05/08/2020, CONSULTATION DATE:  05/09/20 REFERRING MD:  Manuella Ghazi, triad  CHIEF COMPLAINT:  resp failure post covid   Brief History:  53 yowf remote smoker on Rituxan x 2017 from [facility] for neuromyelitis optica with ? Recurrent (vs persistent) covid 19 pna vs organizing pna seen in office 3/7 by Dr Vaughan Browner and rec wean pred and start Imuran but misunderstood instructions and stopped pred 3/7 from a dose of 20 mg to zero (had taken Prednisone 60 mg day prior ) and acutely deteriorated pm 3/8 with fever and sob and readmitted with acute on chronic resp failure and PCCM consulted am 3/9   History of Present Illness:  60 y.o. female, with personal history of radiation, chemotherapy, lumpectomy, neuromyelitis optica, cryptogenic organizing pneumonia, and more presented to the ER  Pm 3/8 with a chief complaint of dyspnea.  Since patient first was diagnosed with Covid in 2020 she has had a decline in her pulmonary health.  Patient most recently has been on 5 L nasal cannula at rest and 7 L nasal cannula with any exertion at home.  Today she could not get her oxygen saturations up past 85% even on 10 L nasal cannula at rest.  Her heart rate was 150.  She was not having coughing spells, they report that she is coughed less lately than in the past.  She did feel feverish and chilled but she did not have chest pain.  Patient reported no rhinorrhea, no body aches, no sore throat.  Patient does have a history of DVT and PE in 2018 that were provoked, and she was on Xarelto for 6 months after that.  She had a PE study 1 month PTA  that was negative.    Patient does report that she has been immobile at home due to dyspnea.  She is also had a 30 pound weight loss in the last several months, due to dyspnea. Patient reports that she is hungry, but she gets so short of breath when she tries to eat that she loses interest in food.  Patient has no  other complaints at this time.  Past Medical History:   Anxiety  Breast cancer Breast cancer  Neuromyelitis optica   history of chemotherapy  history of radiation therapy.   Significant Hospital Events:    Consults:  PCCM 3/9   Procedures:    Significant Diagnostic Tests:    Micro Data:  Resp Panel  PCR 3/8 > neg flu/ POS COVID 19 MRSA PCR  3/9 neg  BC x 2  3/9 >>>    Antimicrobials:  Zmax 3/8 Cefepime 3/8 Rocephin 3/8 Vanc 3/8    Scheduled Meds: . ascorbic acid  500 mg Oral Daily  . aspirin  81 mg Oral Daily  . azaTHIOprine  50 mg Oral Daily  . Chlorhexidine Gluconate Cloth  6 each Topical Daily  . feeding supplement  237 mL Oral TID BM  . fluticasone furoate-vilanterol  1 puff Inhalation Daily  . gabapentin  300 mg Oral QHS  . heparin  5,000 Units Subcutaneous Q8H  . insulin aspart  0-15 Units Subcutaneous TID WC  . insulin aspart  0-5 Units Subcutaneous QHS  . insulin glargine  10 Units Subcutaneous Daily  . melatonin  6 mg Oral QHS  . methylPREDNISolone (SOLU-MEDROL) injection  60 mg Intravenous Q6H  . multivitamin with minerals  1 tablet Oral Daily  . pantoprazole  40 mg Oral Daily  . sulfamethoxazole-trimethoprim  1 tablet Oral Once per day on Mon Wed Fri  . zinc sulfate  220 mg Oral Daily   Continuous Infusions: . ceFEPime (MAXIPIME) IV 2 g (05/11/20 0357)   PRN Meds:.guaiFENesin, Ipratropium-Albuterol, simethicone, sodium chloride   Interim History / Subjective:  Remains on 10 lpm at rest  Denies cough or sputum production. Denies fevers or chills.     Objective   Blood pressure 108/61, pulse 77, temperature 98.3 F (36.8 C), temperature source Oral, resp. rate 19, height 5\' 3"  (1.6 m), weight 69.1 kg, SpO2 90 %.          Intake/Output Summary (Last 24 hours) at 05/11/2020 1101 Last data filed at 05/11/2020 0300 Gross per 24 hour  Intake 200 ml  Output -  Net 200 ml   Filed Weights   05/08/20 2323 05/09/20 0300  Weight: 68.4 kg  69.1 kg   Examination: General: adult woman, mild respiratory distress, resting in bed HENT: /AT, moist mucous membranes Lungs: diminished breath sounds. Bibasilar crackles. Unable to talk in full sentences.  Cardiovascular: RRR, no MRG Abdomen: soft, non-tender, active bowel sounds  Extremities: warm, no edema Neuro: alert, oriented, follows commands  GU: deferred  Resolved Hospital Problem list      Assessment & Plan:   Acute on chronic hypoxemic resp failure  She has recurrent COVID positivity with neg COVID IgG related to her chronic immunosuppression with steroids and rituxan.  There is question of if this is driving her recurrent COP-type flares or if something else may be responsible as her parenchymal abnormalities predated her initial COVID infection (and actually appeared after.  Regardless of what is driving, it seems to respond well to steroids.  I have pretty low suspicion for infectious causes and think that yield from bronchoscopy would be pretty low at this point. - Drop solumedrol to 60mg  q12h from q6h - Continue bactrim ppx - Continue imuran, f/u TPMT - DC all other antimicrobials - Continue supplemental O2: now down to 4L (her best has been 2L at rest and with activity) - I am not sure we can get approval for COVID antibody here, will discuss with Dr. Lake Bells - Continue O2 wean, IS - Will follow with you  Erskine Emery MD PCCM

## 2020-05-11 NOTE — Progress Notes (Signed)
PROGRESS NOTE                                                                             PROGRESS NOTE                                                                                                                                                                                                             Patient Demographics:    Grace Moyer, is a 60 y.o. female, DOB - August 11, 1960, ATF:[id number]  Outpatient Primary MD for the patient is Sharilyn Sites, MD    LOS - 2  Admit date - 05/08/2020    No chief complaint on file.      Brief Narrative    a59 y.o.female,with personal history of radiation, chemotherapy, lumpectomy, neuromyelitis optica, cryptogenic organizing pneumonia, and more who presented to the ER with a chief complaint of dyspnea.  Patient has had an overall decline in her condition from a pulmonary standpoint since she was first diagnosed with Covid in 2020.  She typically wears 4-5 L nasal cannula at home and was admitted with acute hypoxemic respiratory failure requiring 11 L nasal cannula.  She was also quite tachycardic on admission and is quite anxious as well.  She has had prior history of DVT/PE 2018 and completed her course of Xarelto.  She has also recently had CT PE study within the last month which was negative.  She has also had a 30 pound weight loss in the last several months due to her dyspnea and appears to be failing to thrive.  She was seen by her pulmonologist Dr. Vaughan Browner on 3/7 and was thought to have cryptogenic organizing pneumonia with possible opportunistic infections, but she was not stable enough for bronchoscopy.  She was started on Imuran and was told to continue her prednisone, however it appears the patient self tapered off of this.  She has been seen by pulmonology, Dr. Melvyn Novas at [facility] today who recommends transfer to [facility] for [facility] evaluation   Subjective:    Grace Marker  Moyer today still reports  dyspnea, mainly with activity, she denies any fever, chest pain .   Assessment  & Plan :    Active Problems:   History of COVID-19   Transaminitis   Cryptogenic organizing pneumonia ([place])   Acute respiratory failure with hypoxia (HCC)   Lactic acidosis   Sepsis due to pneumonia ([place])   Protein-calorie malnutrition, severe   Pressure injury of skin     Acute on chronic hypoxemic respiratory failure  -patient with baseline respiratory failure with underlining organizing pneumonia, with possible superimposed opportunistic infection, and recent COVID-19 infection . -Pulmonary input greatly appreciated, continue with IV Solu-Medrol, with Bactrim prophylaxis . -Continue with Imuran 50 mg oral daily  -Wean oxygen as tolerated . - fungitell and Aspergillus ag negative.  -Patient with history of recurrent Covid infection, and vaccine, but her Covid IgG is nonreactive, this is related to her immunosuppression with steroids and Rituxan, unclear what is driving factor of her worsening respiratory failure possibly related to COVID-19 infection, versus chronic Rituxan therapy, but it appears to be improving with steroids, so at this point pulmonary felt [name] is not indicated, and antibiotics has been discontinued, and her steroid is being tapered. -Patient Covid IgG is nonreactive, despite having recurrent COVID-19 positivity and vaccination, this is related to her immunosuppression with Rituxan and steroids, patient received monoclonal antibody.  Severe sepsis secondary to pneumonia-resolving -Abscess present on admission with lactic acid 2.9 tachycardic in the 150s, tachypneic with respiratory rate at 27, febrile 101.6. -Antibiotic discontinued today.  Sinus tachycardia secondary to above-resolving -Continue telemetry monitoring -Appears to be improving with IV fluid hydration and improvement in dyspnea -venous Dopplers negative for DVT  Mild transaminitis -Continue to monitor in  a.m.  Anemia of chronic illness  Weight loss and failure to thrive -Consider palliative evaluation  History of neuromyelitis optica -Takes rituximab at home, usually takes it every 10-month, next dose is in April.  COVID 19+ -She was diagnosed recently/08/2020 (third episode first was in November 2020, second was in August 2021, third was in February of this year), she is>21 days of her recent infection, no indication for isolation, will discontinue her isolation -Patient Covid IgG is nonreactive, despite having recurrent COVID-19 positivity and vaccination, this is related to her immunosuppression with Rituxan and steroids, patient received monoclonal antibody.  SpO2: 95 % O2 Flow Rate (L/min): 4 L/min  Recent Labs  Lab 05/07/20 0943 05/08/20 2334 05/09/20 0044 05/09/20 0519 05/09/20 0739 05/09/20 1215 05/10/20 0305 05/10/20 1507 05/11/20 0106  WBC 12.6* 8.5  --  4.7  --   --  5.0  --  7.1  PLT 195.0 175  --  132*  --   --  134*  --  141*  CRP  --   --   --   --   --   --   --  9.5*  --   BNP  --   --  67.0  --   --   --   --   --   --   DDIMER  --   --   --   --  1.12*  --   --   --   --   PROCALCITON  --   --   --  0.16  --   --  0.37  --  0.27  AST  --  65*  --  44*  --  39  --   --   --   ALT  --  122*  --  92*  --  97*  --   --   --   ALKPHOS  --  188*  --  135*  --  148*  --   --   --   BILITOT  --  1.0  --  0.7  --  0.6  --   --   --   ALBUMIN  --  3.4*  --  2.4*  --  2.6*  --   --   --   INR  --  1.0  --   --   --   --   --   --   --   LATICACIDVEN  --  2.9* 1.7  --   --   --  1.0  --   --   SARSCOV2NAA  --  POSITIVE*  --   --   --   --   --   --   --        ABG     Component Value Date/Time   HCO3 24.3 05/09/2020 0519   TCO2 25 08/15/2016 1234   ACIDBASEDEF 0.1 05/09/2020 0519   O2SAT 95.9 05/09/2020 0519          Condition - Extremely Guarded  Family Communication  :  Daughter at bedside  Code Status :  Full  Consults  :   PCCM  Procedures  :  none   Disposition Plan  :    Status is: Inpatient  Remains inpatient appropriate because:IV treatments appropriate due to intensity of illness or inability to take PO   Dispo: The patient is from: Home              Anticipated d/c is to: Home              Patient currently is not medically stable to d/c.   Difficult to place patient No      DVT Prophylaxis  :   Heparin   Lab Results  Component Value Date   PLT 141 (L) 05/11/2020    Diet :  Diet Order            Diet regular Room service appropriate? Yes; Fluid consistency: Thin  Diet effective now                  Inpatient Medications  Scheduled Meds: . ascorbic acid  500 mg Oral Daily  . aspirin  81 mg Oral Daily  . azaTHIOprine  50 mg Oral Daily  . Chlorhexidine Gluconate Cloth  6 each Topical Daily  . feeding supplement  237 mL Oral TID BM  . fluticasone furoate-vilanterol  1 puff Inhalation Daily  . gabapentin  300 mg Oral QHS  . heparin  5,000 Units Subcutaneous Q8H  . insulin aspart  0-15 Units Subcutaneous TID WC  . insulin aspart  0-5 Units Subcutaneous QHS  . insulin aspart  5 Units Subcutaneous TID WC  . insulin glargine  10 Units Subcutaneous Daily  . melatonin  6 mg Oral QHS  . methylPREDNISolone (SOLU-MEDROL) injection  60 mg Intravenous Q12H  . multivitamin with minerals  1 tablet Oral Daily  . pantoprazole  40 mg Oral Daily  . sulfamethoxazole-trimethoprim  1 tablet Oral Once per day on Mon Wed Fri  . zinc sulfate  220 mg Oral Daily   Continuous Infusions: . sodium chloride    . famotidine (PEPCID) IV    . sotrovimab IVPB     PRN Meds:.sodium chloride, albuterol,  diphenhydrAMINE, EPINEPHrine, famotidine (PEPCID) IV, guaiFENesin, Ipratropium-Albuterol, methylPREDNISolone (SOLU-MEDROL) injection, simethicone, sodium chloride  Antibiotics  :    Anti-infectives (From admission, onward)   Start     Dose/Rate Route Frequency Ordered Stop   05/11/20 1000   sulfamethoxazole-trimethoprim (BACTRIM DS) 800-160 MG per tablet 1 tablet       Note to Pharmacy: Patient taking differently: Mon, Wed, Fri     1 tablet Oral Once per day on Mon Wed Fri 05/10/20 0707     05/10/20 0000  vancomycin (VANCOREADY) IVPB 1500 mg/300 mL  Status:  Discontinued        1,500 mg 150 mL/hr over 120 Minutes Intravenous Every 24 hours 05/09/20 0204 05/09/20 1201   05/09/20 0400  ceFEPIme (MAXIPIME) 2 g in sodium chloride 0.9 % 100 mL IVPB  Status:  Discontinued        2 g 200 mL/hr over 30 Minutes Intravenous Every 8 hours 05/09/20 0204 05/11/20 1102   05/09/20 0215  vancomycin (VANCOREADY) IVPB 1500 mg/300 mL        1,500 mg 150 mL/hr over 120 Minutes Intravenous  Once 05/09/20 0204 05/09/20 0450   05/08/20 2345  cefTRIAXone (ROCEPHIN) 2 g in sodium chloride 0.9 % 100 mL IVPB  Status:  Discontinued        2 g 200 mL/hr over 30 Minutes Intravenous Every 24 hours 05/08/20 2334 05/09/20 0201   05/08/20 2345  azithromycin (ZITHROMAX) 500 mg in sodium chloride 0.9 % 250 mL IVPB  Status:  Discontinued        500 mg 250 mL/hr over 60 Minutes Intravenous Every 24 hours 05/08/20 2334 05/09/20 0201        Emeline Gins Linlee Cromie M.D on 05/11/2020 at 3:21 PM  To page go to [email or web address]   [facility] -  Office  [phone]       Objective:   Vitals:   05/10/20 1610 05/10/20 2043 05/11/20 0405 05/11/20 1207  BP: 123/83 (!) 112/54 108/61 110/72  Pulse: (!) 101 97 77 100  Resp: (!) 21 16 19  (!) 23  Temp: 98.4 F (36.9 C) 98.8 F (37.1 C) 98.3 F (36.8 C) 98 F (36.7 C)  TempSrc: Oral Oral Oral Oral  SpO2: 96% 93% 90% 95%  Weight:      Height:        Wt Readings from Last 3 Encounters:  05/09/20 69.1 kg  05/07/20 68.4 kg  04/08/20 68 kg     Intake/Output Summary (Last 24 hours) at 05/11/2020 1521 Last data filed at 05/11/2020 1100 Gross per 24 hour  Intake 200 ml  Output 400 ml  Net -200 ml     Physical Exam  Awake Alert, Oriented X 3, No new  F.N deficits, frail, normal affect Symmetrical Chest wall movement,  bibasilar crackles, diminished breath sounds bilaterally, and mild respiratory distress, unable to take full sentences. RRR,No Gallops,Rubs or new Murmurs, No Parasternal Heave B.Sounds, Abd Soft, No tenderness, No rebound - guarding or rigidity. No Cyanosis, Clubbing or edema, No new Rash or bruise      Data Review:    CBC Recent Labs  Lab 05/07/20 0943 05/08/20 2334 05/09/20 0519 05/10/20 0305 05/11/20 0106  WBC 12.6* 8.5 4.7 5.0 7.1  HGB 13.1 13.5 10.3* 9.9* 9.7*  HCT 39.4 42.2 33.4* 29.8* 29.1*  PLT 195.0 175 132* 134* 141*  MCV 85.5 88.5 90.8 86.4 86.4  MCH  --  28.3 28.0 28.7 28.8  MCHC 33.4 32.0 30.8 33.2 33.3  RDW 18.9* 16.8*  16.9* 16.7* 16.6*  LYMPHSABS  --  0.2*  --   --   --   MONOABS  --  0.1  --   --   --   EOSABS  --  0.0  --   --   --   BASOSABS  --  0.0  --   --   --     Recent Labs  Lab 05/08/20 2334 05/09/20 0044 05/09/20 0519 05/09/20 0739 05/09/20 1215 05/10/20 0305 05/10/20 1507 05/11/20 0106  NA 133*  --  137  --  133*  --   --  137  K 3.9  --  3.5  --  3.6  --   --  3.6  CL 96*  --  105  --  101  --   --  102  CO2 24  --  23  --  22  --   --  27  GLUCOSE 226*  --  224*  --  343*  --   --  164*  BUN 18  --  14  --  15  --   --  20  CREATININE 0.58  --  0.47  --  0.40*  --   --  0.44  CALCIUM 8.6*  --  7.7*  --  8.1*  --   --  8.7*  AST 65*  --  44*  --  39  --   --   --   ALT 122*  --  92*  --  97*  --   --   --   ALKPHOS 188*  --  135*  --  148*  --   --   --   BILITOT 1.0  --  0.7  --  0.6  --   --   --   ALBUMIN 3.4*  --  2.4*  --  2.6*  --   --   --   MG  --   --   --   --   --  1.8  --   --   CRP  --   --   --   --   --   --  9.5*  --   DDIMER  --   --   --  1.12*  --   --   --   --   PROCALCITON  --   --  0.16  --   --  0.37  --  0.27  LATICACIDVEN 2.9* 1.7  --   --   --  1.0  --   --   INR 1.0  --   --   --   --   --   --   --   HGBA1C 7.9*  --   --   --   --    --   --  8.1*  BNP  --  67.0  --   --   --   --   --   --     ------------------------------------------------------------------------------------------------------------------ No results for input(s): CHOL, HDL, LDLCALC, TRIG, CHOLHDL, LDLDIRECT in the last 72 hours.  Lab Results  Component Value Date   HGBA1C 8.1 (H) 05/11/2020   ------------------------------------------------------------------------------------------------------------------ No results for input(s): TSH, T4TOTAL, T3FREE, THYROIDAB in the last 72 hours.  Invalid input(s): FREET3  Cardiac Enzymes No results for input(s): CKMB, TROPONINI, MYOGLOBIN in the last 168 hours.  Invalid input(s): CK ------------------------------------------------------------------------------------------------------------------    Component Value Date/Time   BNP 67.0 05/09/2020 0044  Micro Results Recent Results (from the past 240 hour(s))  Resp Panel by RT-PCR (Flu A&B, Covid) Nasopharyngeal Swab     Status: Abnormal   Collection Time: 05/08/20 11:34 PM   Specimen: Nasopharyngeal Swab; Nasopharyngeal(NP) swabs in vial transport medium  Result Value Ref Range Status   SARS Coronavirus 2 by RT PCR POSITIVE (A) NEGATIVE Final    Comment: RESULT CALLED TO, READ BACK BY AND VERIFIED WITH: H SPENCE,RN@0055  05/09/20 MKELLY (NOTE) SARS-CoV-2 target nucleic acids are DETECTED.  The SARS-CoV-2 RNA is generally detectable in upper respiratory specimens during the acute phase of infection. Positive results are indicative of the presence of the identified virus, but do not rule out bacterial infection or co-infection with other pathogens not detected by the test. Clinical correlation with patient history and other diagnostic information is necessary to determine patient infection status. The expected result is Negative.  Fact Sheet for Patients: EntrepreneurPulse.com.au  Fact Sheet for Healthcare  Providers: IncredibleEmployment.be  This test is not yet approved or cleared by the [place] FDA and  has been authorized for detection and/or diagnosis of SARS-CoV-2 by FDA under an Emergency Use Authorization (EUA).  This EUA will remain in effect (meaning this test can be  used) for the duration of  the COVID-19 declaration under Section 564(b)(1) of the Act, 21 U.S.C. section 360bbb-3(b)(1), unless the authorization is terminated or revoked sooner.     Influenza A by PCR NEGATIVE NEGATIVE Final   Influenza B by PCR NEGATIVE NEGATIVE Final    Comment: (NOTE) The Xpert Xpress SARS-CoV-2/FLU/RSV plus assay is intended as an aid in the diagnosis of influenza from Nasopharyngeal swab specimens and should not be used as a sole basis for treatment. Nasal washings and aspirates are unacceptable for Xpert Xpress SARS-CoV-2/FLU/RSV testing.  Fact Sheet for Patients: EntrepreneurPulse.com.au  Fact Sheet for Healthcare Providers: IncredibleEmployment.be  This test is not yet approved or cleared by the [place] FDA and has been authorized for detection and/or diagnosis of SARS-CoV-2 by FDA under an Emergency Use Authorization (EUA). This EUA will remain in effect (meaning this test can be used) for the duration of the COVID-19 declaration under Section 564(b)(1) of the Act, 21 U.S.C. section 360bbb-3(b)(1), unless the authorization is terminated or revoked.  Performed at [facility], [address] [place], [place] [address]   Blood Culture (routine x 2)     Status: None (Preliminary result)   Collection Time: 05/09/20 12:44 AM   Specimen: BLOOD RIGHT HAND  Result Value Ref Range Status   Specimen Description BLOOD RIGHT HAND  Final   Special Requests   Final    BOTTLES DRAWN AEROBIC AND ANAEROBIC Blood Culture results may not be optimal due to an excessive volume of blood received in culture bottles   Culture   Final     NO GROWTH 2 DAYS Performed at [facility], [address], [place],  [address]    Report Status PENDING  Incomplete  MRSA PCR Screening     Status: None   Collection Time: 05/09/20  3:16 AM   Specimen: Nasal Mucosa; Nasopharyngeal  Result Value Ref Range Status   MRSA by PCR NEGATIVE NEGATIVE Final    Comment:        The GeneXpert MRSA Assay (FDA approved for NASAL specimens only), is one component of a comprehensive MRSA colonization surveillance program. It is not intended to diagnose MRSA infection nor to guide or monitor treatment for MRSA infections. Performed at [facility], [place]  [address], [place], [place] [address]   Urine culture     Status: None   Collection Time: 05/10/20 12:38 AM   Specimen: In/Out Cath Urine  Result Value Ref Range Status   Specimen Description IN/OUT CATH URINE  Final   Special Requests NONE  Final   Culture   Final    NO GROWTH Performed at [facility], [place] [address] [place], [place] [address]    Report Status 05/11/2020 FINAL  Final  Blood Culture (routine x 2)     Status: None (Preliminary result)   Collection Time: 05/10/20  3:11 AM   Specimen: BLOOD RIGHT FOREARM  Result Value Ref Range Status   Specimen Description BLOOD RIGHT FOREARM  Final   Special Requests AEROBIC BOTTLE ONLY Blood Culture adequate volume  Final   Culture   Final    NO GROWTH 1 DAY Performed at [facility], [place] [address] [place], [place] [address]    Report Status PENDING  Incomplete  Respiratory (~20 pathogens) panel by PCR     Status: None   Collection Time: 05/10/20  7:59 AM   Specimen: Nasopharyngeal Swab; Respiratory  Result Value Ref Range Status   Adenovirus NOT DETECTED NOT DETECTED Final   Coronavirus 229E NOT DETECTED NOT DETECTED Final    Comment: (NOTE) The Coronavirus on the Respiratory Panel, DOES NOT test for the novel  Coronavirus (2019 nCoV)    Coronavirus HKU1 NOT DETECTED NOT DETECTED Final   Coronavirus NL63  NOT DETECTED NOT DETECTED Final   Coronavirus OC43 NOT DETECTED NOT DETECTED Final   Metapneumovirus NOT DETECTED NOT DETECTED Final   Rhinovirus / Enterovirus NOT DETECTED NOT DETECTED Final   Influenza A NOT DETECTED NOT DETECTED Final   Influenza B NOT DETECTED NOT DETECTED Final   Parainfluenza Virus 1 NOT DETECTED NOT DETECTED Final   Parainfluenza Virus 2 NOT DETECTED NOT DETECTED Final   Parainfluenza Virus 3 NOT DETECTED NOT DETECTED Final   Parainfluenza Virus 4 NOT DETECTED NOT DETECTED Final   Respiratory Syncytial Virus NOT DETECTED NOT DETECTED Final   Bordetella pertussis NOT DETECTED NOT DETECTED Final   Bordetella Parapertussis NOT DETECTED NOT DETECTED Final   Chlamydophila pneumoniae NOT DETECTED NOT DETECTED Final   Mycoplasma pneumoniae NOT DETECTED NOT DETECTED Final    Comment: Performed at [facility], [place]. [address] [place], [place] [address]    Radiology Reports US Venous Img Lower Bilateral (DVT)  Result Date: 05/09/2020 CLINICAL DATA:  Right leg pain, positive D-dimer EXAM: BILATERAL LOWER EXTREMITY VENOUS DOPPLER ULTRASOUND TECHNIQUE: Gray-scale sonography with graded compression, as well as color Doppler and duplex ultrasound were performed to evaluate the lower extremity deep venous systems from the level of the common femoral vein and including the common femoral, femoral, profunda femoral, popliteal and calf veins including the posterior tibial, peroneal and gastrocnemius veins when visible. The superficial great saphenous vein was also interrogated. Spectral Doppler was utilized to evaluate flow at rest and with distal augmentation maneuvers in the common femoral, femoral and popliteal veins. COMPARISON:  None. FINDINGS: RIGHT LOWER EXTREMITY Common Femoral Vein: No evidence of thrombus. Normal compressibility, respiratory phasicity and response to augmentation. Saphenofemoral Junction: No evidence of thrombus. Normal compressibility and flow on color  Doppler imaging. Profunda Femoral Vein: No evidence of thrombus. Normal compressibility and flow on color Doppler imaging. Femoral Vein: No evidence of thrombus. Normal compressibility, respiratory phasicity and response to augmentation. Popliteal Vein: No evidence of thrombus. Normal compressibility, respiratory  phasicity and response to augmentation. Calf Veins: No evidence of thrombus. Normal compressibility and flow on color Doppler imaging. LEFT LOWER EXTREMITY Common Femoral Vein: No evidence of thrombus. Normal compressibility, respiratory phasicity and response to augmentation. Saphenofemoral Junction: No evidence of thrombus. Normal compressibility and flow on color Doppler imaging. Profunda Femoral Vein: No evidence of thrombus. Normal compressibility and flow on color Doppler imaging. Femoral Vein: No evidence of thrombus. Normal compressibility, respiratory phasicity and response to augmentation. Popliteal Vein: No evidence of thrombus. Normal compressibility, respiratory phasicity and response to augmentation. Calf Veins: No evidence of thrombus. Normal compressibility and flow on color Doppler imaging. IMPRESSION: No evidence of deep venous thrombosis in either lower extremity. Electronically Signed   By: Jerilynn Mages.  Shick M.D.   On: 05/09/2020 14:06   DG Chest Port 1 View  Result Date: 05/08/2020 CLINICAL DATA:  Shortness of breath. EXAM: PORTABLE CHEST 1 VIEW COMPARISON:  Radiograph and CT 04/08/2020 FINDINGS: Lung volumes are low. Upper normal heart size. Unchanged mediastinal contours. Patchy lower lobe predominant opacities that have increased from prior exam. No pneumothorax or pleural effusion. No pulmonary edema. No acute osseous abnormalities are seen. IMPRESSION: Patchy lower lobe predominant opacities have increased from prior exam. This may represent organizing pneumonia post COVID. Electronically Signed   By: Keith Rake M.D.   On: 05/08/2020 23:55

## 2020-05-11 NOTE — Plan of Care (Signed)
Patient is currently resting in bed. VSS. Remains on 5L. SOB this AM, given PRN inhaler. Not OOB throughout shift. Call bell within reach.   Problem: Education: Goal: Knowledge of General Education information will improve Description: Including pain rating scale, medication(s)/side effects and non-pharmacologic comfort measures Outcome: Progressing   Problem: Health Behavior/Discharge Planning: Goal: Ability to manage health-related needs will improve Outcome: Progressing   Problem: Clinical Measurements: Goal: Ability to maintain clinical measurements within normal limits will improve Outcome: Progressing Goal: Will remain free from infection Outcome: Progressing Goal: Diagnostic test results will improve Outcome: Progressing Goal: Respiratory complications will improve Outcome: Progressing Goal: Cardiovascular complication will be avoided Outcome: Progressing   Problem: Activity: Goal: Risk for activity intolerance will decrease Outcome: Progressing   Problem: Nutrition: Goal: Adequate nutrition will be maintained Outcome: Progressing   Problem: Coping: Goal: Level of anxiety will decrease Outcome: Progressing   Problem: Elimination: Goal: Will not experience complications related to bowel motility Outcome: Progressing Goal: Will not experience complications related to urinary retention Outcome: Progressing   Problem: Pain Managment: Goal: General experience of comfort will improve Outcome: Progressing   Problem: Safety: Goal: Ability to remain free from injury will improve Outcome: Progressing   Problem: Skin Integrity: Goal: Risk for impaired skin integrity will decrease Outcome: Progressing

## 2020-05-11 NOTE — Consult Note (Signed)
   [facility] CM Inpatient Consult   05/11/2020  Andria Head 04-07-1960 [id number]   [place] [facility] [ACO] Patient: [employer] Blue [employer] PPO  PCP: Sharilyn Sites, MD of [facility], this provider's office is listed to provide transition of care needs.  Patient screened for hospitalization with noted high risk score for unplanned readmission risk and with less than 30 days readmission.  Electronic medical record reviewed to assess for potential [place] Management service needs for post hospital transition.    Plan:  Continue to follow progress and disposition to assess for post hospital care management needs.    For questions contact:   Natividad Brood, RN BSN [facility] Liaison  [phone] business mobile phone Toll free office [phone]  Fax number: [phone] [email or web address]

## 2020-05-11 NOTE — Progress Notes (Signed)
Inpatient Diabetes Program Recommendations  AACE/ADA: New Consensus Statement on Inpatient Glycemic Control (2015)  Target Ranges:  Prepandial:   less than 140 mg/dL      Peak postprandial:   less than 180 mg/dL (1-2 hours)      Critically ill patients:  140 - 180 mg/dL   Lab Results  Component Value Date   GLUCAP 372 (H) 05/11/2020   HGBA1C 8.1 (H) 05/11/2020    Review of Glycemic Control Results for Grace Moyer, Grace Moyer (MRN [id number]) as of 05/11/2020 12:41  Ref. Range 05/10/2020 12:30 05/10/2020 17:14 05/10/2020 20:39 05/11/2020 07:14 05/11/2020 12:06  Glucose-Capillary Latest Ref Range: 70 - 99 mg/dL 246 (H) 407 (H) 256 (H) 243 (H) 372 (H)    Inpatient Diabetes Program Recommendations:    While on steroids: -Add Novolog 5 units tid meal coverage if eats 50% Secure chat sent to Dr. Marland Kitchen.  Thank you, Nani Gasser. Chavy Avera, RN, MSN, CDE  Diabetes Coordinator Inpatient Glycemic Control Team Team Pager [phone] (8am-5pm) 05/11/2020 12:42 PM

## 2020-05-11 NOTE — Progress Notes (Signed)
Indication for COVID Ab transfusion: COP with incidental COVID +

## 2020-05-12 DIAGNOSIS — J9601 Acute respiratory failure with hypoxia: Secondary | ICD-10-CM | POA: Diagnosis not present

## 2020-05-12 LAB — URINE CULTURE: Culture: NO GROWTH

## 2020-05-12 LAB — GLUCOSE, CAPILLARY
Glucose-Capillary: 235 mg/dL — ABNORMAL HIGH (ref 70–99)
Glucose-Capillary: 210 mg/dL — ABNORMAL HIGH (ref 70–99)
Glucose-Capillary: 256 mg/dL — ABNORMAL HIGH (ref 70–99)
Glucose-Capillary: 225 mg/dL — ABNORMAL HIGH (ref 70–99)

## 2020-05-12 LAB — CBC
HCT: 30.6 % — ABNORMAL LOW (ref 36.0–46.0)
Hemoglobin: 9.8 g/dL — ABNORMAL LOW (ref 12.0–15.0)
MCH: 28.3 pg (ref 26.0–34.0)
MCHC: 32 g/dL (ref 30.0–36.0)
MCV: 88.4 fL (ref 80.0–100.0)
Platelets: 155 10*3/uL (ref 150–400)
RBC: 3.46 MIL/uL — ABNORMAL LOW (ref 3.87–5.11)
RDW: 16.4 % — ABNORMAL HIGH (ref 11.5–15.5)
WBC: 7 10*3/uL (ref 4.0–10.5)
nRBC: 0.7 % — ABNORMAL HIGH (ref 0.0–0.2)

## 2020-05-12 LAB — BASIC METABOLIC PANEL
Anion gap: 7 (ref 5–15)
BUN: 14 mg/dL (ref 6–20)
CO2: 28 mmol/L (ref 22–32)
Calcium: 8.8 mg/dL — ABNORMAL LOW (ref 8.9–10.3)
Chloride: 102 mmol/L (ref 98–111)
Creatinine, Ser: 0.54 mg/dL (ref 0.44–1.00)
GFR, Estimated: >60 mL/min (ref >60)
Glucose, Bld: 209 mg/dL — ABNORMAL HIGH (ref 70–99)
Potassium: 4.1 mmol/L (ref 3.5–5.1)
Sodium: 137 mmol/L (ref 135–145)

## 2020-05-12 MED ORDER — OXYMETAZOLINE HCL 0.05 % NA SOLN
1.0000 | Freq: Two times a day (BID) | NASAL | Status: AC
  Administered 2020-05-12: 1 via NASAL
  Filled 2020-05-12: qty 30

## 2020-05-12 MED ORDER — ALPRAZOLAM 0.5 MG PO TABS
0.5000 mg | ORAL_TABLET | Freq: Two times a day (BID) | ORAL | Status: DC | PRN
  Administered 2020-05-12 – 2020-05-15 (×6): 0.5 mg via ORAL
  Filled 2020-05-12 (×6): qty 1

## 2020-05-12 NOTE — Progress Notes (Addendum)
PROGRESS NOTE                                                                             PROGRESS NOTE                                                                                                                                                                                                             Patient Demographics:    Grace Moyer, is a 60 y.o. female, DOB - [birth year], SWN:[id number]  Outpatient Primary MD for the patient is Sharilyn Sites, MD    LOS - 3  Admit date - 05/08/2020    No chief complaint on file.      Brief Narrative    a59 y.o.female,with personal history of radiation, chemotherapy, lumpectomy, neuromyelitis optica, cryptogenic organizing pneumonia, and more who presented to the ER with a chief complaint of dyspnea.  Patient has had an overall decline in her condition from a pulmonary standpoint since she was first diagnosed with Covid in 2020.  She typically wears 4-5 L nasal cannula at home and was admitted with acute hypoxemic respiratory failure requiring 11 L nasal cannula.  She was also quite tachycardic on admission and is quite anxious as well.  She has had prior history of DVT/PE 2018 and completed her course of Xarelto.  She has also recently had CT PE study within the last month which was negative.  She has also had a 30 pound weight loss in the last several months due to her dyspnea and appears to be failing to thrive.  She was seen by her pulmonologist Dr. Vaughan Browner on 3/7 and was thought to have cryptogenic organizing pneumonia with possible opportunistic infections, but she was not stable enough for bronchoscopy.  She was started on Imuran and was told to continue her prednisone, however it appears the patient self tapered off of this.  She has been seen by pulmonology, Dr. Melvyn Novas at [facility] today who recommends transfer to [facility] for [facility] evaluation   Subjective:    Grace Marker  Moyer today ports dyspnea has  improved, still present, was able to ambulate to the bathroom with increased oxygen requirement from 4 > 10 L .   Assessment  & Plan :    Active Problems:   History of COVID-19   Transaminitis   Cryptogenic organizing pneumonia ([place])   Acute respiratory failure with hypoxia (HCC)   Lactic acidosis   Sepsis due to pneumonia ([place])   Protein-calorie malnutrition, severe   Pressure injury of skin     Acute on chronic hypoxemic respiratory failure  -patient with baseline respiratory failure with underlining organizing pneumonia, with possible superimposed opportunistic infection, and recent COVID-19 infection . -Pulmonary input greatly appreciated, continue with IV Solu-Medrol, with Bactrim prophylaxis . -Continue with Imuran 50 mg oral daily  -Wean oxygen as tolerated . - fungitell and Aspergillus ag negative.  -Patient with history of recurrent Covid infection, and vaccine, but her Covid IgG is nonreactive, this is related to her immunosuppression with steroids and Rituxan, unclear what is driving factor of her worsening respiratory failure possibly related to COVID-19 infection, versus chronic Rituxan therapy, but it appears to be improving with steroids, so at this point pulmonary opportunistic infection is unlikely, so antibiotic has been discontinued. -Continue with steroids.. -Patient Covid IgG is nonreactive, despite having recurrent COVID-19 positivity and vaccination, this is related to her immunosuppression with Rituxan and steroids, patient received monoclonal antibody.  Severe sepsis secondary to pneumonia-resolving -Abscess present on admission with lactic acid 2.9 tachycardic in the 150s, tachypneic with respiratory rate at 27, febrile 101.6. -Antibiotic discontinued today.  Diabetes mellitus, new diagnosis with hyperglycemia, Likely induced by steroids -A1c is 8.1, she is with hyperglycemia, better controlled with Lantus and NovoLog, her diet has been changed to carb  modified, Ensure has been changed to Glucerna with improvement of her CBG.  Sinus tachycardia secondary to above-resolving -Continue telemetry monitoring -Appears to be improving with IV fluid hydration and improvement in dyspnea -venous Dopplers negative for DVT  Mild transaminitis -Continue to monitor in a.m.  Anemia of chronic illness  Weight loss and failure to thrive -Consider palliative evaluation  History of neuromyelitis optica -Takes rituximab at home, usually takes it every 92-month, next dose is in April.  COVID 19+ -She was diagnosed recently/08/2020 (third episode first was in November 2020, second was in August 2021, third was in February of this year), she is>21 days of her recent infection, no indication for isolation, will discontinue her isolation -Patient Covid IgG is nonreactive, despite having recurrent COVID-19 positivity and vaccination, this is related to her immunosuppression with Rituxan and steroids, patient received monoclonal antibody.  SpO2: 98 % O2 Flow Rate (L/min): 4 L/min  Recent Labs  Lab 05/08/20 2334 05/09/20 0044 05/09/20 0519 05/09/20 0739 05/09/20 1215 05/10/20 0305 05/10/20 1507 05/11/20 0106 05/12/20 0351  WBC 8.5  --  4.7  --   --  5.0  --  7.1 7.0  PLT 175  --  132*  --   --  134*  --  141* 155  CRP  --   --   --   --   --   --  9.5*  --   --   BNP  --  67.0  --   --   --   --   --   --   --   DDIMER  --   --   --  1.12*  --   --   --   --   --  PROCALCITON  --   --  0.16  --   --  0.37  --  0.27  --   AST 65*  --  44*  --  39  --   --   --   --   ALT 122*  --  92*  --  97*  --   --   --   --   ALKPHOS 188*  --  135*  --  148*  --   --   --   --   BILITOT 1.0  --  0.7  --  0.6  --   --   --   --   ALBUMIN 3.4*  --  2.4*  --  2.6*  --   --   --   --   INR 1.0  --   --   --   --   --   --   --   --   LATICACIDVEN 2.9* 1.7  --   --   --  1.0  --   --   --   SARSCOV2NAA POSITIVE*  --   --   --   --   --   --   --   --         ABG     Component Value Date/Time   HCO3 24.3 05/09/2020 0519   TCO2 25 08/15/2016 1234   ACIDBASEDEF 0.1 05/09/2020 0519   O2SAT 95.9 05/09/2020 0519          Condition - Extremely Guarded  Family Communication  :  Daughter at bedside  Code Status :  Full  Consults  :  PCCM  Procedures  :  none   Disposition Plan  :    Status is: Inpatient  Remains inpatient appropriate because:IV treatments appropriate due to intensity of illness or inability to take PO   Dispo: The patient is from: Home              Anticipated d/c is to: Home              Patient currently is not medically stable to d/c.   Difficult to place patient No      DVT Prophylaxis  :   Heparin   Lab Results  Component Value Date   PLT 155 05/12/2020    Diet :  Diet Order            Diet Carb Modified Fluid consistency: Thin; Room service appropriate? Yes  Diet effective now                  Inpatient Medications  Scheduled Meds: . ascorbic acid  500 mg Oral Daily  . aspirin  81 mg Oral Daily  . azaTHIOprine  50 mg Oral Daily  . Chlorhexidine Gluconate Cloth  6 each Topical Daily  . feeding supplement (GLUCERNA SHAKE)  237 mL Oral TID BM  . fluticasone furoate-vilanterol  1 puff Inhalation Daily  . gabapentin  300 mg Oral QHS  . heparin  5,000 Units Subcutaneous Q8H  . insulin aspart  0-15 Units Subcutaneous TID WC  . insulin aspart  0-5 Units Subcutaneous QHS  . insulin aspart  5 Units Subcutaneous TID WC  . insulin glargine  10 Units Subcutaneous Daily  . melatonin  6 mg Oral QHS  . methylPREDNISolone (SOLU-MEDROL) injection  60 mg Intravenous Q12H  . multivitamin with minerals  1 tablet Oral Daily  . oxymetazoline  1 spray Each  Nare BID  . pantoprazole  40 mg Oral Daily  . sulfamethoxazole-trimethoprim  1 tablet Oral Once per day on Mon Wed Fri  . zinc sulfate  220 mg Oral Daily   Continuous Infusions: . sodium chloride    . famotidine (PEPCID) IV     PRN  Meds:.sodium chloride, acetaminophen, albuterol, ALPRAZolam, diphenhydrAMINE, EPINEPHrine, famotidine (PEPCID) IV, guaiFENesin, Ipratropium-Albuterol, methylPREDNISolone (SOLU-MEDROL) injection, simethicone, sodium chloride  Antibiotics  :    Anti-infectives (From admission, onward)   Start     Dose/Rate Route Frequency Ordered Stop   05/11/20 1000  sulfamethoxazole-trimethoprim (BACTRIM DS) 800-160 MG per tablet 1 tablet       Note to Pharmacy: Patient taking differently: Mon, Wed, Fri     1 tablet Oral Once per day on Mon Wed Fri 05/10/20 0707     05/10/20 0000  vancomycin (VANCOREADY) IVPB 1500 mg/300 mL  Status:  Discontinued        1,500 mg 150 mL/hr over 120 Minutes Intravenous Every 24 hours 05/09/20 0204 05/09/20 1201   05/09/20 0400  ceFEPIme (MAXIPIME) 2 g in sodium chloride 0.9 % 100 mL IVPB  Status:  Discontinued        2 g 200 mL/hr over 30 Minutes Intravenous Every 8 hours 05/09/20 0204 05/11/20 1102   05/09/20 0215  vancomycin (VANCOREADY) IVPB 1500 mg/300 mL        1,500 mg 150 mL/hr over 120 Minutes Intravenous  Once 05/09/20 0204 05/09/20 0450   05/08/20 2345  cefTRIAXone (ROCEPHIN) 2 g in sodium chloride 0.9 % 100 mL IVPB  Status:  Discontinued        2 g 200 mL/hr over 30 Minutes Intravenous Every 24 hours 05/08/20 2334 05/09/20 0201   05/08/20 2345  azithromycin (ZITHROMAX) 500 mg in sodium chloride 0.9 % 250 mL IVPB  Status:  Discontinued        500 mg 250 mL/hr over 60 Minutes Intravenous Every 24 hours 05/08/20 2334 05/09/20 0201        Emeline Gins Klair Leising M.D on 05/12/2020 at 12:08 PM  To page go to [email or web address]   [facility] -  Office  [phone]       Objective:   Vitals:   05/11/20 1207 05/11/20 2049 05/12/20 0433 05/12/20 1144  BP: 110/72 (!) 121/57 112/70 110/67  Pulse: 100 93 88 72  Resp: (!) 23 (!) 21 (!) 22 (!) 23  Temp: 98 F (36.7 C) 98.6 F (37 C) 98.6 F (37 C) 97.8 F (36.6 C)  TempSrc: Oral Oral Oral Oral  SpO2: 95% 90%  95% 98%  Weight:      Height:        Wt Readings from Last 3 Encounters:  05/09/20 69.1 kg  05/07/20 68.4 kg  04/08/20 68 kg     Intake/Output Summary (Last 24 hours) at 05/12/2020 1208 Last data filed at 05/12/2020 0830 Gross per 24 hour  Intake 103.91 ml  Output 550 ml  Net -446.09 ml     Physical Exam  Awake Alert, Oriented X 3, No new F.N deficits, frail, normal affect Symmetrical Chest wall movement, minimal use of accessory muscles, diminished air entry at the bases, she appears to be less dyspneic today. RRR,No Gallops,Rubs or new Murmurs, No Parasternal Heave +ve B.Sounds, Abd Soft, No tenderness, No rebound - guarding or rigidity. No Cyanosis, Clubbing or edema, No new Rash or bruise       Data Review:    CBC Recent Labs  Lab 05/08/20 2334 05/09/20  1017 05/10/20 0305 05/11/20 0106 05/12/20 0351  WBC 8.5 4.7 5.0 7.1 7.0  HGB 13.5 10.3* 9.9* 9.7* 9.8*  HCT 42.2 33.4* 29.8* 29.1* 30.6*  PLT 175 132* 134* 141* 155  MCV 88.5 90.8 86.4 86.4 88.4  MCH 28.3 28.0 28.7 28.8 28.3  MCHC 32.0 30.8 33.2 33.3 32.0  RDW 16.8* 16.9* 16.7* 16.6* 16.4*  LYMPHSABS 0.2*  --   --   --   --   MONOABS 0.1  --   --   --   --   EOSABS 0.0  --   --   --   --   BASOSABS 0.0  --   --   --   --     Recent Labs  Lab 05/08/20 2334 05/09/20 0044 05/09/20 0519 05/09/20 0739 05/09/20 1215 05/10/20 0305 05/10/20 1507 05/11/20 0106 05/12/20 0351  NA 133*  --  137  --  133*  --   --  137 137  K 3.9  --  3.5  --  3.6  --   --  3.6 4.1  CL 96*  --  105  --  101  --   --  102 102  CO2 24  --  23  --  22  --   --  27 28  GLUCOSE 226*  --  224*  --  343*  --   --  164* 209*  BUN 18  --  14  --  15  --   --  20 14  CREATININE 0.58  --  0.47  --  0.40*  --   --  0.44 0.54  CALCIUM 8.6*  --  7.7*  --  8.1*  --   --  8.7* 8.8*  AST 65*  --  44*  --  39  --   --   --   --   ALT 122*  --  92*  --  97*  --   --   --   --   ALKPHOS 188*  --  135*  --  148*  --   --   --   --    BILITOT 1.0  --  0.7  --  0.6  --   --   --   --   ALBUMIN 3.4*  --  2.4*  --  2.6*  --   --   --   --   MG  --   --   --   --   --  1.8  --   --   --   CRP  --   --   --   --   --   --  9.5*  --   --   DDIMER  --   --   --  1.12*  --   --   --   --   --   PROCALCITON  --   --  0.16  --   --  0.37  --  0.27  --   LATICACIDVEN 2.9* 1.7  --   --   --  1.0  --   --   --   INR 1.0  --   --   --   --   --   --   --   --   HGBA1C 7.9*  --   --   --   --   --   --  8.1*  --   BNP  --  67.0  --   --   --   --   --   --   --     ------------------------------------------------------------------------------------------------------------------ No results for input(s): CHOL, HDL, LDLCALC, TRIG, CHOLHDL, LDLDIRECT in the last 72 hours.  Lab Results  Component Value Date   HGBA1C 8.1 (H) 05/11/2020   ------------------------------------------------------------------------------------------------------------------ No results for input(s): TSH, T4TOTAL, T3FREE, THYROIDAB in the last 72 hours.  Invalid input(s): FREET3  Cardiac Enzymes No results for input(s): CKMB, TROPONINI, MYOGLOBIN in the last 168 hours.  Invalid input(s): CK ------------------------------------------------------------------------------------------------------------------    Component Value Date/Time   BNP 67.0 05/09/2020 0044    Micro Results Recent Results (from the past 240 hour(s))  Resp Panel by RT-PCR (Flu A&B, Covid) Nasopharyngeal Swab     Status: Abnormal   Collection Time: 05/08/20 11:34 PM   Specimen: Nasopharyngeal Swab; Nasopharyngeal(NP) swabs in vial transport medium  Result Value Ref Range Status   SARS Coronavirus 2 by RT PCR POSITIVE (A) NEGATIVE Final    Comment: RESULT CALLED TO, READ BACK BY AND VERIFIED WITH: H SPENCE,RN@0055  05/09/20 MKELLY (NOTE) SARS-CoV-2 target nucleic acids are DETECTED.  The SARS-CoV-2 RNA is generally detectable in upper respiratory specimens during the acute phase  of infection. Positive results are indicative of the presence of the identified virus, but do not rule out bacterial infection or co-infection with other pathogens not detected by the test. Clinical correlation with patient history and other diagnostic information is necessary to determine patient infection status. The expected result is Negative.  Fact Sheet for Patients: EntrepreneurPulse.com.au  Fact Sheet for Healthcare Providers: IncredibleEmployment.be  This test is not yet approved or cleared by the [place] FDA and  has been authorized for detection and/or diagnosis of SARS-CoV-2 by FDA under an Emergency Use Authorization (EUA).  This EUA will remain in effect (meaning this test can be  used) for the duration of  the COVID-19 declaration under Section 564(b)(1) of the Act, 21 U.S.C. section 360bbb-3(b)(1), unless the authorization is terminated or revoked sooner.     Influenza A by PCR NEGATIVE NEGATIVE Final   Influenza B by PCR NEGATIVE NEGATIVE Final    Comment: (NOTE) The Xpert Xpress SARS-CoV-2/FLU/RSV plus assay is intended as an aid in the diagnosis of influenza from Nasopharyngeal swab specimens and should not be used as a sole basis for treatment. Nasal washings and aspirates are unacceptable for Xpert Xpress SARS-CoV-2/FLU/RSV testing.  Fact Sheet for Patients: EntrepreneurPulse.com.au  Fact Sheet for Healthcare Providers: IncredibleEmployment.be  This test is not yet approved or cleared by the [place] FDA and has been authorized for detection and/or diagnosis of SARS-CoV-2 by FDA under an Emergency Use Authorization (EUA). This EUA will remain in effect (meaning this test can be used) for the duration of the COVID-19 declaration under Section 564(b)(1) of the Act, 21 U.S.C. section 360bbb-3(b)(1), unless the authorization is terminated or revoked.  Performed at [facility], [address] [place], [place] [address]   Blood Culture (routine x 2)     Status: None (Preliminary result)   Collection Time: 05/09/20 12:44 AM   Specimen: BLOOD RIGHT HAND  Result Value Ref Range Status   Specimen Description BLOOD RIGHT HAND  Final   Special Requests   Final    BOTTLES DRAWN AEROBIC AND ANAEROBIC Blood Culture results may not be optimal due to an excessive volume of blood received in culture bottles   Culture   Final    NO GROWTH 3 DAYS Performed at [facility]  [facility], [address], [place], [place] [address]    Report Status PENDING  Incomplete  MRSA PCR Screening     Status: None   Collection Time: 05/09/20  3:16 AM   Specimen: Nasal Mucosa; Nasopharyngeal  Result Value Ref Range Status   MRSA by PCR NEGATIVE NEGATIVE Final    Comment:        The GeneXpert MRSA Assay (FDA approved for NASAL specimens only), is one component of a comprehensive MRSA colonization surveillance program. It is not intended to diagnose MRSA infection nor to guide or monitor treatment for MRSA infections. Performed at [facility], [address], [place], [place] [address]   Urine culture     Status: None   Collection Time: 05/10/20 12:38 AM   Specimen: In/Out Cath Urine  Result Value Ref Range Status   Specimen Description IN/OUT CATH URINE  Final   Special Requests NONE  Final   Culture   Final    NO GROWTH Performed at [facility], [place] [address], [place], [place] [address]    Report Status 05/11/2020 FINAL  Final  Blood Culture (routine x 2)     Status: None (Preliminary result)   Collection Time: 05/10/20  3:11 AM   Specimen: BLOOD RIGHT FOREARM  Result Value Ref Range Status   Specimen Description BLOOD RIGHT FOREARM  Final   Special Requests AEROBIC BOTTLE ONLY Blood Culture adequate volume  Final   Culture   Final    NO GROWTH 1 DAY Performed at [facility], [place] [address], [place], [place] [address]    Report Status PENDING  Incomplete   Respiratory (~20 pathogens) panel by PCR     Status: None   Collection Time: 05/10/20  7:59 AM   Specimen: Nasopharyngeal Swab; Respiratory  Result Value Ref Range Status   Adenovirus NOT DETECTED NOT DETECTED Final   Coronavirus 229E NOT DETECTED NOT DETECTED Final    Comment: (NOTE) The Coronavirus on the Respiratory Panel, DOES NOT test for the novel  Coronavirus (2019 nCoV)    Coronavirus HKU1 NOT DETECTED NOT DETECTED Final   Coronavirus NL63 NOT DETECTED NOT DETECTED Final   Coronavirus OC43 NOT DETECTED NOT DETECTED Final   Metapneumovirus NOT DETECTED NOT DETECTED Final   Rhinovirus / Enterovirus NOT DETECTED NOT DETECTED Final   Influenza A NOT DETECTED NOT DETECTED Final   Influenza B NOT DETECTED NOT DETECTED Final   Parainfluenza Virus 1 NOT DETECTED NOT DETECTED Final   Parainfluenza Virus 2 NOT DETECTED NOT DETECTED Final   Parainfluenza Virus 3 NOT DETECTED NOT DETECTED Final   Parainfluenza Virus 4 NOT DETECTED NOT DETECTED Final   Respiratory Syncytial Virus NOT DETECTED NOT DETECTED Final   Bordetella pertussis NOT DETECTED NOT DETECTED Final   Bordetella Parapertussis NOT DETECTED NOT DETECTED Final   Chlamydophila pneumoniae NOT DETECTED NOT DETECTED Final   Mycoplasma pneumoniae NOT DETECTED NOT DETECTED Final    Comment: Performed at [facility], [place]. [address] [place], [place] [address]    Radiology Reports US Venous Img Lower Bilateral (DVT)  Result Date: 05/09/2020 CLINICAL DATA:  Right leg pain, positive D-dimer EXAM: BILATERAL LOWER EXTREMITY VENOUS DOPPLER ULTRASOUND TECHNIQUE: Gray-scale sonography with graded compression, as well as color Doppler and duplex ultrasound were performed to evaluate the lower extremity deep venous systems from the level of the common femoral vein and including the common femoral, femoral, profunda femoral, popliteal and calf veins including the posterior tibial, peroneal and  gastrocnemius veins when visible. The  superficial great saphenous vein was also interrogated. Spectral Doppler was utilized to evaluate flow at rest and with distal augmentation maneuvers in the common femoral, femoral and popliteal veins. COMPARISON:  None. FINDINGS: RIGHT LOWER EXTREMITY Common Femoral Vein: No evidence of thrombus. Normal compressibility, respiratory phasicity and response to augmentation. Saphenofemoral Junction: No evidence of thrombus. Normal compressibility and flow on color Doppler imaging. Profunda Femoral Vein: No evidence of thrombus. Normal compressibility and flow on color Doppler imaging. Femoral Vein: No evidence of thrombus. Normal compressibility, respiratory phasicity and response to augmentation. Popliteal Vein: No evidence of thrombus. Normal compressibility, respiratory phasicity and response to augmentation. Calf Veins: No evidence of thrombus. Normal compressibility and flow on color Doppler imaging. LEFT LOWER EXTREMITY Common Femoral Vein: No evidence of thrombus. Normal compressibility, respiratory phasicity and response to augmentation. Saphenofemoral Junction: No evidence of thrombus. Normal compressibility and flow on color Doppler imaging. Profunda Femoral Vein: No evidence of thrombus. Normal compressibility and flow on color Doppler imaging. Femoral Vein: No evidence of thrombus. Normal compressibility, respiratory phasicity and response to augmentation. Popliteal Vein: No evidence of thrombus. Normal compressibility, respiratory phasicity and response to augmentation. Calf Veins: No evidence of thrombus. Normal compressibility and flow on color Doppler imaging. IMPRESSION: No evidence of deep venous thrombosis in either lower extremity. Electronically Signed   By: Jerilynn Mages.  Shick M.D.   On: 05/09/2020 14:06   DG Chest Port 1 View  Result Date: 05/08/2020 CLINICAL DATA:  Shortness of breath. EXAM: PORTABLE CHEST 1 VIEW COMPARISON:  Radiograph and CT 04/08/2020 FINDINGS: Lung volumes are low. Upper normal  heart size. Unchanged mediastinal contours. Patchy lower lobe predominant opacities that have increased from prior exam. No pneumothorax or pleural effusion. No pulmonary edema. No acute osseous abnormalities are seen. IMPRESSION: Patchy lower lobe predominant opacities have increased from prior exam. This may represent organizing pneumonia post COVID. Electronically Signed   By: Keith Rake M.D.   On: 05/08/2020 23:55

## 2020-05-12 NOTE — Evaluation (Signed)
Physical Therapy Evaluation Patient Details Name: Lake Breeding MRN: [id number] DOB: 1960/08/08 Today's Date: 05/12/2020   History of Present Illness  Dezaria is a 60 y/o female who presented to ED with dyspnea. Pt was was admitted on 05/12/20 and diagnosed with sepsis due to PNA. Was also found to be Covid (+), however pt was removed from isolation precautions. Pt originally had covid-19 in 2020 and since has had worsening pulmonary function. This is the 4th time she has tested positive for covid. PMH includes colon and breast cancer s/p lumpectomy, neuromyelitis optica, history of PNA, and history of PE & DVT.    Clinical Impression  Pt received in bed, cooperative and pleasant. Pts daughter present for entire session. Pt fairly steady in standing, no physical assist given, but fatigues quickly. SPO2 dropping to 83% on 6L, needing several minutes to rise >90% on 10L following stand pivot to [facility]. Education provided on deep breathing techniques to address oxygen intake/perfusion and decrease anxiety. Left in bed with all needs met, call bell within reach, bed alarm active, RN aware of status, and daughter present. Pt would benefit from continued PT and home health PT upon discharge to address strength and endurance. Will continue to follow acutely.     Follow Up Recommendations Home health PT    Equipment Recommendations  3in1 (PT);Other (comment);Wheelchair (measurements PT);Wheelchair cushion (measurements PT) (rollator)    Recommendations for Other Services       Precautions / Restrictions Precautions Precautions: Fall Precaution Comments: monitor O2 Restrictions Weight Bearing Restrictions: No      Mobility  Bed Mobility Overal bed mobility: Needs Assistance Bed Mobility: Supine to Sit;Sit to Supine     Supine to sit: Supervision Sit to supine: Supervision        Transfers Overall transfer level: Needs assistance Equipment used: None Transfers: Sit to/from  [facility] Sit to Stand: Min guard Stand pivot transfers: Min guard       General transfer comment: Min guard for safety, no physical assist given  Ambulation/Gait             General Gait Details: deferred due to desat during stand pivot o [facility]  Stairs            Wheelchair Mobility    Modified Rankin (Stroke Patients Only)       Balance Overall balance assessment: Needs assistance   Sitting balance-Leahy Scale: Good       Standing balance-Leahy Scale: Fair Standing balance comment: Fair standing balance, but would benefit from UE support during ambulation especially for longer distance                             Pertinent Vitals/Pain Pain Assessment: Faces Faces Pain Scale: Hurts a little bit Pain Location: generalized discomfort Pain Descriptors / Indicators: Discomfort Pain Intervention(s): Monitored during session    Home Living                        Prior Function                 Hand Dominance        Extremity/Trunk Assessment   Upper Extremity Assessment Upper Extremity Assessment: Defer to OT evaluation    Lower Extremity Assessment Lower Extremity Assessment: Generalized weakness    Cervical / Trunk Assessment Cervical / Trunk Assessment: Kyphotic  Communication      Cognition Arousal/Alertness: Awake/alert Behavior During  Therapy: WFL for tasks assessed/performed;Anxious Overall Cognitive Status: Within Functional Limits for tasks assessed                                 General Comments: A&Ox4. Pleasant and cooperative but a little anxious (according to daughter, less anxious than normal)      General Comments General comments (skin integrity, edema, etc.): Pt received on 10L O2, dropped to 6L. SPO2 dropped to 83% following stand pivot to  [facility], needed several minutes on 10L to rise back to >90%. SPO2 >90% on 6L when PT left. Reports using 5-7L O2 at home. Educated  pt on deep breathing techniques to address anxiety as well as perfusion into lungs.    Exercises     Assessment/Plan    PT Assessment Patient needs continued PT services  PT Problem List Decreased strength;Decreased mobility;Decreased safety awareness;Decreased coordination;Decreased activity tolerance;Decreased balance;Decreased knowledge of use of DME       PT Treatment Interventions DME instruction;Therapeutic exercise;Gait training;Balance training;Stair training;Neuromuscular re-education;Functional mobility training;Patient/family education;Therapeutic activities;Wheelchair mobility training    PT Goals (Current goals can be found in the Care Plan section)  Acute Rehab PT Goals Patient Stated Goal: walk better PT Goal Formulation: With patient Time For Goal Achievement: 05/26/20 Potential to Achieve Goals: Fair    Frequency Min 3X/week   Barriers to discharge        Co-evaluation               AM-PAC PT "6 Clicks" Mobility  Outcome Measure Help needed turning from your back to your side while in a flat bed without using bedrails?: A Little Help needed moving from lying on your back to sitting on the side of a flat bed without using bedrails?: A Little Help needed moving to and from a bed to a chair (including a wheelchair)?: A Little Help needed standing up from a chair using your arms (e.g., wheelchair or bedside chair)?: A Little Help needed to walk in hospital room?: A Lot Help needed climbing 3-5 steps with a railing? : A Lot 6 Click Score: 16    End of Session   Activity Tolerance: Other (comment);Patient limited by fatigue (Limited by SPO2) Patient left: in bed;with family/visitor present;with bed alarm set;with call bell/phone within reach Nurse Communication: Mobility status;Other (comment) (Decreased O2 to 6L) PT Visit Diagnosis: Unsteadiness on feet (R26.81);Muscle weakness (generalized) (M62.81)    Time:  -      Charges:             Rosita Kea, SPT

## 2020-05-12 NOTE — Progress Notes (Signed)
Pt began having panic attack, was tachypneic and stated she was having trouble breathing. O2 bumped to 10L HF - O2 sat remained at 90-91% for several minutes. Encouraged pt to take deep breaths to slow breathing down. Pt stated her nose was very stopped up and that she couldn't take deep breaths through her nose. Sent chat to Dr. Waldron Labs who ordered PRN Xanax and Afrin. Will continue to monitor.

## 2020-05-13 DIAGNOSIS — J9601 Acute respiratory failure with hypoxia: Secondary | ICD-10-CM | POA: Diagnosis not present

## 2020-05-13 MED ORDER — METFORMIN HCL 500 MG PO TABS
500.0000 mg | ORAL_TABLET | Freq: Two times a day (BID) | ORAL | Status: DC
  Administered 2020-05-13 – 2020-05-14 (×4): 500 mg via ORAL
  Filled 2020-05-13 (×4): qty 1

## 2020-05-13 MED ORDER — PREDNISONE 20 MG PO TABS: 20.0000 mg | ORAL_TABLET | Freq: Every day | ORAL | Status: DC

## 2020-05-13 MED ORDER — PREDNISONE 20 MG PO TABS: 40.0000 mg | ORAL_TABLET | Freq: Every day | ORAL | Status: DC

## 2020-05-13 MED ORDER — PREDNISONE 20 MG PO TABS
60.0000 mg | ORAL_TABLET | Freq: Every day | ORAL | Status: DC
  Administered 2020-05-14: 60 mg via ORAL
  Filled 2020-05-13: qty 3

## 2020-05-13 NOTE — Progress Notes (Signed)
Pulmonary Progress Note  S: Seen in f/u for likely COP flare. Had slight panic attack this am leading to increased supplemental O2.  O: No acute distress Lungs with rhonci bilaterally Moving all 4 ext  A: Ill defined steroid responsive ILD- see discussion 05/12/20.  Seems to be improving with steroids, we agree to keep current course rather than likely low yield bronch. Fungitell and aspergillus ag neg.  COVID-associated ILD flare without frank COVID pneumonia- COVID Ab administered, hopefully Ab will reduce inflammatory milieu.  P: - Continue steroids - Continue imuran - Mobility as tolerated - Wean O2 to maintain sats >90% - Will follow with you  Erskine Emery MD PCCM

## 2020-05-13 NOTE — Progress Notes (Signed)
NAME:  Brion Hedges, MRN:  [id number], DOB:  [birth year], LOS: 4 ADMISSION DATE:  05/08/2020, CONSULTATION DATE:  05/09/20 REFERRING MD:  Manuella Ghazi, triad  CHIEF COMPLAINT:  resp failure post covid   Brief History:  65 yowf remote smoker on Rituxan x 2017 from [facility] for neuromyelitis optica with ? Recurrent (vs persistent) covid 19 pna vs organizing pna seen in office 3/7 by Dr Vaughan Browner and rec wean pred and start Imuran but misunderstood instructions and stopped pred 3/7 from a dose of 20 mg to zero (had taken Prednisone 60 mg day prior ) and acutely deteriorated pm 3/8 with fever and sob and readmitted with acute on chronic resp failure and PCCM consulted am 3/9   History of Present Illness:  60 y.o. female, with personal history of radiation, chemotherapy, lumpectomy, neuromyelitis optica, cryptogenic organizing pneumonia, and more presented to the ER  Pm 3/8 with a chief complaint of dyspnea.  Since patient first was diagnosed with Covid in 2020 she has had a decline in her pulmonary health.  Patient most recently has been on 5 L nasal cannula at rest and 7 L nasal cannula with any exertion at home.  Today she could not get her oxygen saturations up past 85% even on 10 L nasal cannula at rest.  Her heart rate was 150.  She was not having coughing spells, they report that she is coughed less lately than in the past.  She did feel feverish and chilled but she did not have chest pain.  Patient reported no rhinorrhea, no body aches, no sore throat.  Patient does have a history of DVT and PE in 2018 that were provoked, and she was on Xarelto for 6 months after that.  She had a PE study 1 month PTA  that was negative.    Patient does report that she has been immobile at home due to dyspnea.  She is also had a 30 pound weight loss in the last several months, due to dyspnea. Patient reports that she is hungry, but she gets so short of breath when she tries to eat that she loses interest in food.  Patient has no  other complaints at this time.  Past Medical History:   Anxiety  Breast cancer Breast cancer  Neuromyelitis optica   history of chemotherapy  history of radiation therapy.   Significant Hospital Events:    Consults:  PCCM 3/9   Procedures:    Significant Diagnostic Tests:    Micro Data:  Resp Panel  PCR 3/8 > neg flu/ POS COVID 19 MRSA PCR  3/9 neg  BC x 2  3/9 >>>    Antimicrobials:  Zmax 3/8 Cefepime 3/8 Rocephin 3/8 Vanc 3/8    Scheduled Meds: . ascorbic acid  500 mg Oral Daily  . aspirin  81 mg Oral Daily  . azaTHIOprine  50 mg Oral Daily  . Chlorhexidine Gluconate Cloth  6 each Topical Daily  . feeding supplement (GLUCERNA SHAKE)  237 mL Oral TID BM  . fluticasone furoate-vilanterol  1 puff Inhalation Daily  . gabapentin  300 mg Oral QHS  . heparin  5,000 Units Subcutaneous Q8H  . insulin aspart  0-15 Units Subcutaneous TID WC  . insulin aspart  0-5 Units Subcutaneous QHS  . insulin aspart  5 Units Subcutaneous TID WC  . insulin glargine  10 Units Subcutaneous Daily  . melatonin  6 mg Oral QHS  . metFORMIN  500 mg Oral BID WC  .  methylPREDNISolone (SOLU-MEDROL) injection  60 mg Intravenous Q12H  . multivitamin with minerals  1 tablet Oral Daily  . oxymetazoline  1 spray Each Nare BID  . pantoprazole  40 mg Oral Daily  . sulfamethoxazole-trimethoprim  1 tablet Oral Once per day on Mon Wed Fri  . zinc sulfate  220 mg Oral Daily   Continuous Infusions: . sodium chloride    . famotidine (PEPCID) IV     PRN Meds:.sodium chloride, acetaminophen, albuterol, ALPRAZolam, diphenhydrAMINE, EPINEPHrine, famotidine (PEPCID) IV, guaiFENesin, Ipratropium-Albuterol, methylPREDNISolone (SOLU-MEDROL) injection, simethicone, sodium chloride   Interim History / Subjective:  No events. Feeling better. Down to 2L at rest. PT was to work with her yesterday but she was very SOB from having walked ot bathroom.  Objective   Blood pressure 98/71, pulse 79,  temperature 98.4 F (36.9 C), temperature source Oral, resp. rate 20, height 5\' 3"  (1.6 m), weight 72.5 kg, SpO2 100 %.          Intake/Output Summary (Last 24 hours) at 05/13/2020 1053 Last data filed at 05/13/2020 0758 Gross per 24 hour  Intake 240 ml  Output 1200 ml  Net -960 ml   Filed Weights   05/08/20 2323 05/09/20 0300 05/13/20 0400  Weight: 68.4 kg 69.1 kg 72.5 kg   Examination: Constitutional: no acute distress  Eyes: EOMI, pupils equal Ears, nose, mouth, and throat: trachea midline Cardiovascular: RRR, ext warm Respiratory: more clear today, no accessory muscle use Gastrointestinal: soft, +BS Skin: No rashes, normal turgor Neurologic: moves all 4 ext to command Psychiatric: RASS 0   Resolved Hospital Problem list      Assessment & Plan:   Acute on chronic hypoxemic resp failure  Most c/w steroid responsive ILD/COP exacerbated by recurrent COVID exposure.  She is unable to produce antibodies to COVID likely due to her recurring need for steroids as well as rituxan therapy (used for neuromyelitis optica).  S/p COVID ab infusion to reduce any ongoing contribution of this.  She is improving with immunosuppression.  Low suspicion for infectious cause given rapidity of improvement.  - DC IV steroids, start slow PO taper - Continue imuran, f/u TPMT - Bactrim pjp ppx - Work with PT, okay to go up on O2 PRN to facilitate this and reduce muscle atrophy from prolonged immobility - They had a number of questions about using COVID abs in future if any exposure which I will discuss with Dr. Vaughan Browner - Will follow with you  Erskine Emery MD PCCM

## 2020-05-13 NOTE — Progress Notes (Signed)
PROGRESS NOTE                                                                             PROGRESS NOTE                                                                                                                                                                                                             Patient Demographics:    Grace Moyer, is a 60 y.o. female, DOB - 07/05/1960, YKD:[id number]  Outpatient Primary MD for the patient is Sharilyn Sites, MD    LOS - 4  Admit date - 05/08/2020    No chief complaint on file.      Brief Narrative    a59 y.o.female,with personal history of radiation, chemotherapy, lumpectomy, neuromyelitis optica, cryptogenic organizing pneumonia, and more who presented to the ER with a chief complaint of dyspnea.  Patient has had an overall decline in her condition from a pulmonary standpoint since she was first diagnosed with Covid in 2020.  She typically wears 4-5 L nasal cannula at home and was admitted with acute hypoxemic respiratory failure requiring 11 L nasal cannula.  She was also quite tachycardic on admission and is quite anxious as well.  She has had prior history of DVT/PE 2018 and completed her course of Xarelto.  She has also recently had CT PE study within the last month which was negative.  She has also had a 30 pound weight loss in the last several months due to her dyspnea and appears to be failing to thrive.  She was seen by her pulmonologist Dr. Vaughan Browner on 3/7 and was thought to have cryptogenic organizing pneumonia with possible opportunistic infections, but she was not stable enough for bronchoscopy.  She was started on Imuran and was told to continue her prednisone, however it appears the patient self tapered off of this.  She has been seen by pulmonology, Dr. Melvyn Novas at [facility] today who recommends transfer to [facility] for further evaluation   Subjective:    Grace Marker  Moyer today reports her dyspnea  has improved, still significant with minimal activity, cough has improved as well.  .   Assessment  & Plan :    Active Problems:   History of COVID-19   Transaminitis   Cryptogenic organizing pneumonia ([place])   Acute respiratory failure with hypoxia (HCC)   Lactic acidosis   Sepsis due to pneumonia ([place])   Protein-calorie malnutrition, severe   Pressure injury of skin     Acute on chronic hypoxemic respiratory failure  -patient with baseline respiratory failure with underlining organizing pneumonia, with possible superimposed opportunistic infection, and recent COVID-19 infection . -Pulmonary input greatly appreciated, treated with IV Solu-Medrol, currently transitioned to p.o. prednisone taper, continue with Bactrim prophylaxis as well.. -Continue with Imuran 50 mg oral daily  -Wean oxygen as tolerated . - fungitell and Aspergillus ag negative.  -Patient with history of recurrent Covid infection, and vaccine, but her Covid IgG is nonreactive, this is related to her immunosuppression with steroids and Rituxan, unclear what is driving factor of her worsening respiratory failure possibly related to COVID-19 infection, versus chronic Rituxan therapy, but it appears to be improving with steroids, so at this point pulmonary opportunistic infection is unlikely, so antibiotic has been discontinued. -Patient Covid IgG is nonreactive, despite having recurrent COVID-19 positivity and vaccination, this is related to her immunosuppression with Rituxan and steroids, patient received monoclonal antibody.  Severe sepsis secondary to pneumonia-resolving -Abscess present on admission with lactic acid 2.9 tachycardic in the 150s, tachypneic with respiratory rate at 27, febrile 101.6. -Antibiotic discontinued today.  Diabetes mellitus, new diagnosis with hyperglycemia, Likely induced by steroids -A1c is 8.1, she is with hyperglycemia, better controlled with Lantus and NovoLog, change her diet to carb  modified and Glucerna instead of Ensure, she was started on Metformin today as well.  Sinus tachycardia secondary to above-resolving -Continue telemetry monitoring -Appears to be improving with IV fluid hydration and improvement in dyspnea -venous Dopplers negative for DVT  Mild transaminitis -Continue to monitor in a.m.  Anemia of chronic illness  Weight loss and failure to thrive -Consider palliative evaluation  History of neuromyelitis optica -Takes rituximab at home, usually takes it every 25-month, next dose is in April.  COVID 19+ -She was diagnosed recently/08/2020 (third episode first was in November 2020, second was in August 2021, third was in February of this year), she is>21 days of her recent infection, no indication for isolation, will discontinue her isolation -Patient Covid IgG is nonreactive, despite having recurrent COVID-19 positivity and vaccination, this is related to her immunosuppression with Rituxan and steroids, patient received monoclonal antibody.  SpO2: 90 % O2 Flow Rate (L/min): 2 L/min  Recent Labs  Lab 05/08/20 2334 05/09/20 0044 05/09/20 0519 05/09/20 0739 05/09/20 1215 05/10/20 0305 05/10/20 1507 05/11/20 0106 05/12/20 0351  WBC 8.5  --  4.7  --   --  5.0  --  7.1 7.0  PLT 175  --  132*  --   --  134*  --  141* 155  CRP  --   --   --   --   --   --  9.5*  --   --   BNP  --  67.0  --   --   --   --   --   --   --   DDIMER  --   --   --  1.12*  --   --   --   --   --   PROCALCITON  --   --  0.16  --   --  0.37  --  0.27  --   AST 65*  --  44*  --  39  --   --   --   --   ALT 122*  --  92*  --  97*  --   --   --   --   ALKPHOS 188*  --  135*  --  148*  --   --   --   --   BILITOT 1.0  --  0.7  --  0.6  --   --   --   --   ALBUMIN 3.4*  --  2.4*  --  2.6*  --   --   --   --   INR 1.0  --   --   --   --   --   --   --   --   LATICACIDVEN 2.9* 1.7  --   --   --  1.0  --   --   --   SARSCOV2NAA POSITIVE*  --   --   --   --   --   --   --    --        ABG     Component Value Date/Time   HCO3 24.3 05/09/2020 0519   TCO2 25 08/15/2016 1234   ACIDBASEDEF 0.1 05/09/2020 0519   O2SAT 95.9 05/09/2020 0519          Condition - Extremely Guarded  Family Communication  :  Daughter at bedside  Code Status :  Full  Consults  :  PCCM  Procedures  :  none   Disposition Plan  :    Status is: Inpatient  Remains inpatient appropriate because:IV treatments appropriate due to intensity of illness or inability to take PO   Dispo: The patient is from: Home              Anticipated d/c is to: Home              Patient currently is not medically stable to d/c.   Difficult to place patient No      DVT Prophylaxis  :   Heparin   Lab Results  Component Value Date   PLT 155 05/12/2020    Diet :  Diet Order            Diet Carb Modified Fluid consistency: Thin; Room service appropriate? No  Diet effective now                  Inpatient Medications  Scheduled Meds: . ascorbic acid  500 mg Oral Daily  . aspirin  81 mg Oral Daily  . azaTHIOprine  50 mg Oral Daily  . Chlorhexidine Gluconate Cloth  6 each Topical Daily  . feeding supplement (GLUCERNA SHAKE)  237 mL Oral TID BM  . fluticasone furoate-vilanterol  1 puff Inhalation Daily  . gabapentin  300 mg Oral QHS  . heparin  5,000 Units Subcutaneous Q8H  . insulin aspart  0-15 Units Subcutaneous TID WC  . insulin aspart  0-5 Units Subcutaneous QHS  . insulin aspart  5 Units Subcutaneous TID WC  . insulin glargine  10 Units Subcutaneous Daily  . melatonin  6 mg Oral QHS  . metFORMIN  500 mg Oral BID WC  . multivitamin with minerals  1 tablet Oral Daily  . oxymetazoline  1 spray Each Nare BID  . pantoprazole  40 mg  Oral Daily  . [START ON 05/14/2020] predniSONE  60 mg Oral Q breakfast   Followed by  . [START ON 05/21/2020] predniSONE  40 mg Oral Q breakfast   Followed by  . [START ON 06/04/2020] predniSONE  20 mg Oral Q breakfast  .  sulfamethoxazole-trimethoprim  1 tablet Oral Once per day on Mon Wed Fri  . zinc sulfate  220 mg Oral Daily   Continuous Infusions: . sodium chloride    . famotidine (PEPCID) IV     PRN Meds:.sodium chloride, acetaminophen, albuterol, ALPRAZolam, diphenhydrAMINE, EPINEPHrine, famotidine (PEPCID) IV, guaiFENesin, Ipratropium-Albuterol, simethicone, sodium chloride  Antibiotics  :    Anti-infectives (From admission, onward)   Start     Dose/Rate Route Frequency Ordered Stop   05/11/20 1000  sulfamethoxazole-trimethoprim (BACTRIM DS) 800-160 MG per tablet 1 tablet       Note to Pharmacy: Patient taking differently: Mon, Wed, Fri     1 tablet Oral Once per day on Mon Wed Fri 05/10/20 0707     05/10/20 0000  vancomycin (VANCOREADY) IVPB 1500 mg/300 mL  Status:  Discontinued        1,500 mg 150 mL/hr over 120 Minutes Intravenous Every 24 hours 05/09/20 0204 05/09/20 1201   05/09/20 0400  ceFEPIme (MAXIPIME) 2 g in sodium chloride 0.9 % 100 mL IVPB  Status:  Discontinued        2 g 200 mL/hr over 30 Minutes Intravenous Every 8 hours 05/09/20 0204 05/11/20 1102   05/09/20 0215  vancomycin (VANCOREADY) IVPB 1500 mg/300 mL        1,500 mg 150 mL/hr over 120 Minutes Intravenous  Once 05/09/20 0204 05/09/20 0450   05/08/20 2345  cefTRIAXone (ROCEPHIN) 2 g in sodium chloride 0.9 % 100 mL IVPB  Status:  Discontinued        2 g 200 mL/hr over 30 Minutes Intravenous Every 24 hours 05/08/20 2334 05/09/20 0201   05/08/20 2345  azithromycin (ZITHROMAX) 500 mg in sodium chloride 0.9 % 250 mL IVPB  Status:  Discontinued        500 mg 250 mL/hr over 60 Minutes Intravenous Every 24 hours 05/08/20 2334 05/09/20 0201        Emeline Gins Annai Heick M.D on 05/13/2020 at 1:29 PM  To page go to [email or web address]   [facility] -  Office  [phone]       Objective:   Vitals:   05/13/20 0019 05/13/20 0400 05/13/20 0754 05/13/20 1208  BP: 105/61 104/62 98/71 114/89  Pulse: 74 68 79 100  Resp: 19 14  20 20   Temp: 97.9 F (36.6 C) 98.8 F (37.1 C) 98.4 F (36.9 C) 98.7 F (37.1 C)  TempSrc: Oral Oral Oral Oral  SpO2: 98% 95% 100% 90%  Weight:  72.5 kg    Height:        Wt Readings from Last 3 Encounters:  05/13/20 72.5 kg  05/07/20 68.4 kg  04/08/20 68 kg     Intake/Output Summary (Last 24 hours) at 05/13/2020 1329 Last data filed at 05/13/2020 0758 Gross per 24 hour  Intake 240 ml  Output 1200 ml  Net -960 ml     Physical Exam  Awake Alert, Oriented X 3, No new F.N deficits, Normal affect Symmetrical Chest wall movement, scattered Rales and rhonchi RRR,No Gallops,Rubs or new Murmurs, No Parasternal Heave +ve B.Sounds, , No tenderness, No rebound - guarding or rigidity. No Cyanosis, Clubbing or edema, No new Rash or bruise  Data Review:    CBC Recent Labs  Lab 05/08/20 2334 05/09/20 0519 05/10/20 0305 05/11/20 0106 05/12/20 0351  WBC 8.5 4.7 5.0 7.1 7.0  HGB 13.5 10.3* 9.9* 9.7* 9.8*  HCT 42.2 33.4* 29.8* 29.1* 30.6*  PLT 175 132* 134* 141* 155  MCV 88.5 90.8 86.4 86.4 88.4  MCH 28.3 28.0 28.7 28.8 28.3  MCHC 32.0 30.8 33.2 33.3 32.0  RDW 16.8* 16.9* 16.7* 16.6* 16.4*  LYMPHSABS 0.2*  --   --   --   --   MONOABS 0.1  --   --   --   --   EOSABS 0.0  --   --   --   --   BASOSABS 0.0  --   --   --   --     Recent Labs  Lab 05/08/20 2334 05/09/20 0044 05/09/20 0519 05/09/20 0739 05/09/20 1215 05/10/20 0305 05/10/20 1507 05/11/20 0106 05/12/20 0351  NA 133*  --  137  --  133*  --   --  137 137  K 3.9  --  3.5  --  3.6  --   --  3.6 4.1  CL 96*  --  105  --  101  --   --  102 102  CO2 24  --  23  --  22  --   --  27 28  GLUCOSE 226*  --  224*  --  343*  --   --  164* 209*  BUN 18  --  14  --  15  --   --  20 14  CREATININE 0.58  --  0.47  --  0.40*  --   --  0.44 0.54  CALCIUM 8.6*  --  7.7*  --  8.1*  --   --  8.7* 8.8*  AST 65*  --  44*  --  39  --   --   --   --   ALT 122*  --  92*  --  97*  --   --   --   --   ALKPHOS 188*  --   135*  --  148*  --   --   --   --   BILITOT 1.0  --  0.7  --  0.6  --   --   --   --   ALBUMIN 3.4*  --  2.4*  --  2.6*  --   --   --   --   MG  --   --   --   --   --  1.8  --   --   --   CRP  --   --   --   --   --   --  9.5*  --   --   DDIMER  --   --   --  1.12*  --   --   --   --   --   PROCALCITON  --   --  0.16  --   --  0.37  --  0.27  --   LATICACIDVEN 2.9* 1.7  --   --   --  1.0  --   --   --   INR 1.0  --   --   --   --   --   --   --   --   HGBA1C 7.9*  --   --   --   --   --   --  8.1*  --   BNP  --  67.0  --   --   --   --   --   --   --     ------------------------------------------------------------------------------------------------------------------ No results for input(s): CHOL, HDL, LDLCALC, TRIG, CHOLHDL, LDLDIRECT in the last 72 hours.  Lab Results  Component Value Date   HGBA1C 8.1 (H) 05/11/2020   ------------------------------------------------------------------------------------------------------------------ No results for input(s): TSH, T4TOTAL, T3FREE, THYROIDAB in the last 72 hours.  Invalid input(s): FREET3  Cardiac Enzymes No results for input(s): CKMB, TROPONINI, MYOGLOBIN in the last 168 hours.  Invalid input(s): CK ------------------------------------------------------------------------------------------------------------------    Component Value Date/Time   BNP 67.0 05/09/2020 0044    Micro Results Recent Results (from the past 240 hour(s))  Resp Panel by RT-PCR (Flu A&B, Covid) Nasopharyngeal Swab     Status: Abnormal   Collection Time: 05/08/20 11:34 PM   Specimen: Nasopharyngeal Swab; Nasopharyngeal(NP) swabs in vial transport medium  Result Value Ref Range Status   SARS Coronavirus 2 by RT PCR POSITIVE (A) NEGATIVE Final    Comment: RESULT CALLED TO, READ BACK BY AND VERIFIED WITH: H SPENCE,RN@0055  05/09/20 MKELLY (NOTE) SARS-CoV-2 target nucleic acids are DETECTED.  The SARS-CoV-2 RNA is generally detectable in upper  respiratory specimens during the acute phase of infection. Positive results are indicative of the presence of the identified virus, but do not rule out bacterial infection or co-infection with other pathogens not detected by the test. Clinical correlation with patient history and other diagnostic information is necessary to determine patient infection status. The expected result is Negative.  Fact Sheet for Patients: EntrepreneurPulse.com.au  Fact Sheet for Healthcare Providers: IncredibleEmployment.be  This test is not yet approved or cleared by the [place] FDA and  has been authorized for detection and/or diagnosis of SARS-CoV-2 by FDA under an Emergency Use Authorization (EUA).  This EUA will remain in effect (meaning this test can be  used) for the duration of  the COVID-19 declaration under Section 564(b)(1) of the Act, 21 U.S.C. section 360bbb-3(b)(1), unless the authorization is terminated or revoked sooner.     Influenza A by PCR NEGATIVE NEGATIVE Final   Influenza B by PCR NEGATIVE NEGATIVE Final    Comment: (NOTE) The Xpert Xpress SARS-CoV-2/FLU/RSV plus assay is intended as an aid in the diagnosis of influenza from Nasopharyngeal swab specimens and should not be used as a sole basis for treatment. Nasal washings and aspirates are unacceptable for Xpert Xpress SARS-CoV-2/FLU/RSV testing.  Fact Sheet for Patients: EntrepreneurPulse.com.au  Fact Sheet for Healthcare Providers: IncredibleEmployment.be  This test is not yet approved or cleared by the [place] FDA and has been authorized for detection and/or diagnosis of SARS-CoV-2 by FDA under an Emergency Use Authorization (EUA). This EUA will remain in effect (meaning this test can be used) for the duration of the COVID-19 declaration under Section 564(b)(1) of the Act, 21 U.S.C. section 360bbb-3(b)(1), unless the authorization is  terminated or revoked.  Performed at [facility], [address] [place], [place] [address]   Blood Culture (routine x 2)     Status: None (Preliminary result)   Collection Time: 05/09/20 12:44 AM   Specimen: BLOOD RIGHT HAND  Result Value Ref Range Status   Specimen Description BLOOD RIGHT HAND  Final   Special Requests   Final    BOTTLES DRAWN AEROBIC AND ANAEROBIC Blood Culture results may not be optimal due to an excessive volume of blood received in culture bottles   Culture   Final  NO GROWTH 4 DAYS Performed at [facility], [address], [place], [place] [address]    Report Status PENDING  Incomplete  MRSA PCR Screening     Status: None   Collection Time: 05/09/20  3:16 AM   Specimen: Nasal Mucosa; Nasopharyngeal  Result Value Ref Range Status   MRSA by PCR NEGATIVE NEGATIVE Final    Comment:        The GeneXpert MRSA Assay (FDA approved for NASAL specimens only), is one component of a comprehensive MRSA colonization surveillance program. It is not intended to diagnose MRSA infection nor to guide or monitor treatment for MRSA infections. Performed at [facility], [address] [place], [place] [address]   Urine culture     Status: None   Collection Time: 05/10/20 12:38 AM   Specimen: In/Out Cath Urine  Result Value Ref Range Status   Specimen Description IN/OUT CATH URINE  Final   Special Requests NONE  Final   Culture   Final    NO GROWTH Performed at [facility], [place] [address] [place], [place] [address]    Report Status 05/11/2020 FINAL  Final  Blood Culture (routine x 2)     Status: None (Preliminary result)   Collection Time: 05/10/20  3:11 AM   Specimen: BLOOD RIGHT FOREARM  Result Value Ref Range Status   Specimen Description BLOOD RIGHT FOREARM  Final   Special Requests AEROBIC BOTTLE ONLY Blood Culture adequate volume  Final   Culture   Final    NO GROWTH 2 DAYS Performed at [facility], [place] [address], [place], [place]  [address]    Report Status PENDING  Incomplete  Respiratory (~20 pathogens) panel by PCR     Status: None   Collection Time: 05/10/20  7:59 AM   Specimen: Nasopharyngeal Swab; Respiratory  Result Value Ref Range Status   Adenovirus NOT DETECTED NOT DETECTED Final   Coronavirus 229E NOT DETECTED NOT DETECTED Final    Comment: (NOTE) The Coronavirus on the Respiratory Panel, DOES NOT test for the novel  Coronavirus (2019 nCoV)    Coronavirus HKU1 NOT DETECTED NOT DETECTED Final   Coronavirus NL63 NOT DETECTED NOT DETECTED Final   Coronavirus OC43 NOT DETECTED NOT DETECTED Final   Metapneumovirus NOT DETECTED NOT DETECTED Final   Rhinovirus / Enterovirus NOT DETECTED NOT DETECTED Final   Influenza A NOT DETECTED NOT DETECTED Final   Influenza B NOT DETECTED NOT DETECTED Final   Parainfluenza Virus 1 NOT DETECTED NOT DETECTED Final   Parainfluenza Virus 2 NOT DETECTED NOT DETECTED Final   Parainfluenza Virus 3 NOT DETECTED NOT DETECTED Final   Parainfluenza Virus 4 NOT DETECTED NOT DETECTED Final   Respiratory Syncytial Virus NOT DETECTED NOT DETECTED Final   Bordetella pertussis NOT DETECTED NOT DETECTED Final   Bordetella Parapertussis NOT DETECTED NOT DETECTED Final   Chlamydophila pneumoniae NOT DETECTED NOT DETECTED Final   Mycoplasma pneumoniae NOT DETECTED NOT DETECTED Final    Comment: Performed at [facility], [place]. [address], [place], [name] [address]    Radiology Reports US Venous Img Lower Bilateral (DVT)  Result Date: 05/09/2020 CLINICAL DATA:  Right leg pain, positive D-dimer EXAM: BILATERAL LOWER EXTREMITY VENOUS DOPPLER ULTRASOUND TECHNIQUE: Gray-scale sonography with graded compression, as well as color Doppler and duplex ultrasound were performed to evaluate the lower extremity deep venous systems from the level of the common femoral vein and including the common femoral, femoral, profunda femoral, popliteal and calf  veins including the posterior tibial, peroneal  and gastrocnemius veins when visible. The superficial great saphenous vein was also interrogated. Spectral Doppler was utilized to evaluate flow at rest and with distal augmentation maneuvers in the common femoral, femoral and popliteal veins. COMPARISON:  None. FINDINGS: RIGHT LOWER EXTREMITY Common Femoral Vein: No evidence of thrombus. Normal compressibility, respiratory phasicity and response to augmentation. Saphenofemoral Junction: No evidence of thrombus. Normal compressibility and flow on color Doppler imaging. Profunda Femoral Vein: No evidence of thrombus. Normal compressibility and flow on color Doppler imaging. Femoral Vein: No evidence of thrombus. Normal compressibility, respiratory phasicity and response to augmentation. Popliteal Vein: No evidence of thrombus. Normal compressibility, respiratory phasicity and response to augmentation. Calf Veins: No evidence of thrombus. Normal compressibility and flow on color Doppler imaging. LEFT LOWER EXTREMITY Common Femoral Vein: No evidence of thrombus. Normal compressibility, respiratory phasicity and response to augmentation. Saphenofemoral Junction: No evidence of thrombus. Normal compressibility and flow on color Doppler imaging. Profunda Femoral Vein: No evidence of thrombus. Normal compressibility and flow on color Doppler imaging. Femoral Vein: No evidence of thrombus. Normal compressibility, respiratory phasicity and response to augmentation. Popliteal Vein: No evidence of thrombus. Normal compressibility, respiratory phasicity and response to augmentation. Calf Veins: No evidence of thrombus. Normal compressibility and flow on color Doppler imaging. IMPRESSION: No evidence of deep venous thrombosis in either lower extremity. Electronically Signed   By: Jerilynn Mages.  Shick M.D.   On: 05/09/2020 14:06   DG Chest Port 1 View  Result Date: 05/08/2020 CLINICAL DATA:  Shortness of breath. EXAM: PORTABLE CHEST 1 VIEW COMPARISON:  Radiograph and CT 04/08/2020  FINDINGS: Lung volumes are low. Upper normal heart size. Unchanged mediastinal contours. Patchy lower lobe predominant opacities that have increased from prior exam. No pneumothorax or pleural effusion. No pulmonary edema. No acute osseous abnormalities are seen. IMPRESSION: Patchy lower lobe predominant opacities have increased from prior exam. This may represent organizing pneumonia post COVID. Electronically Signed   By: Keith Rake M.D.   On: 05/08/2020 23:55

## 2020-05-13 NOTE — Evaluation (Signed)
Occupational Therapy Evaluation Patient Details Name: Davielle Lingelbach MRN: [id number] DOB: 09-May-1960 Today's Date: 05/13/2020    History of Present Illness Gracelyn is a 60 y/o female who presented to ED with dyspnea. Pt was was admitted on 05/12/20 and diagnosed with sepsis due to PNA. Was also found to be Covid (+), however pt was removed from isolation precautions. Pt originally had covid-19 in 2020 and since has had worsening pulmonary function. This is the 4th time she has tested positive for covid. PMH includes colon and breast cancer s/p lumpectomy, neuromyelitis optica, history of PNA, and history of PE & DVT.   Clinical Impression   Mrs. Romie Keeble is a 60 year old woman with a history of multiple COVID infections who presents with generalized weakness, decreased activity tolerance, impaired cardiopulmonary status and anxiety resulting in a decline in functional abilities. On evaluation she presents on 2 L [place] is supervision for bed mobility, min guard for ambulating at side of bed without device and min assist for LB ADLs and set up assist with seated position for UB ADLs. Patient exhibits anxiety with activity resulting in dyspnea needing frequent and prolonged rest breaks, verbal cues for breathing techniques and to calm down. Therapist provided education in regards to diaphragmatic breathing using hand placement as a tactile cue and exercises to improve posture and generalized strengthening. Patient's o2 sat maintained WFL predominantly during task except dropped briefly to 84% after ambulation up and down bed. Patient will benefit from skilled OT services while in hospital to improve deficits and learn compensatory strategies as needed in order to return to PLOF.       Follow Up Recommendations  Home health OT    Equipment Recommendations  None recommended by OT    Recommendations for Other Services       Precautions / Restrictions Precautions Precautions: Fall Precaution  Comments: monitor O2, high anxiety Restrictions Weight Bearing Restrictions: No      Mobility Bed Mobility Overal bed mobility: Needs Assistance Bed Mobility: Supine to Sit;Sit to Supine     Supine to sit: Supervision Sit to supine: Supervision        Transfers Overall transfer level: Needs assistance Equipment used: None Transfers: Sit to/from [facility] Sit to Stand: Min guard Stand pivot transfers: Min guard       General transfer comment: Min guard for safety, no physical assist given    Balance Overall balance assessment: Mild deficits observed, not formally tested   Sitting balance-Leahy Scale: Good       Standing balance-Leahy Scale: Fair                             ADL either performed or assessed with clinical judgement   ADL Overall ADL's : Needs assistance/impaired Eating/Feeding: Independent   Grooming: Set up;Sitting   Upper Body Bathing: Set up;Sitting   Lower Body Bathing: Set up;Sit to/from stand   Upper Body Dressing : Set up;Sitting   Lower Body Dressing: Minimal assistance;Sit to/from stand Lower Body Dressing Details (indicate cue type and reason): assistance for socks and shoes Toilet Transfer: Min guard;BSC;Stand-pivot   Toileting- Clothing Manipulation and Hygiene: Min guard;Sit to/from stand               Vision Patient Visual Report: No change from baseline       Perception     Praxis      Pertinent Vitals/Pain Pain Assessment: No/denies pain  Hand Dominance     Extremity/Trunk Assessment Upper Extremity Assessment Upper Extremity Assessment: Overall WFL for tasks assessed;Generalized weakness (UB strength grossly 4/5)   Lower Extremity Assessment Lower Extremity Assessment: Generalized weakness   Cervical / Trunk Assessment Cervical / Trunk Assessment: Normal   Communication Communication Communication: No difficulties   Cognition Arousal/Alertness: Awake/alert Behavior  During Therapy: WFL for tasks assessed/performed Overall Cognitive Status: Within Functional Limits for tasks assessed                                     General Comments       Exercises     Shoulder Instructions      Home Living Family/patient expects to be discharged to:: Private residence Living Arrangements: Spouse/significant other;Children Available Help at Discharge: Family Type of Home: House Home Access: Stairs to enter Technical brewer of Steps: 3-4 Entrance Stairs-Rails: Right Home Layout: Two level Alternate Level Stairs-Number of Steps: 3 steps to den, where she stays Alternate Level Stairs-Rails: None     Bathroom Toilet: Standard Bathroom Accessibility: Yes   Home Equipment: Cane - quad;Shower seat          Prior Functioning/Environment Level of Independence: Independent with assistive device(s)        Comments: Sometimes uses a cane. Lives in [place] house, but able to live on main floor. However, 3 steps down in the "den" where she stays        OT Problem List: Decreased strength;Decreased activity tolerance;Cardiopulmonary status limiting activity      OT Treatment/Interventions: Self-care/ADL training;Therapeutic exercise;DME and/or AE instruction;Therapeutic activities;Patient/family education    OT Goals(Current goals can be found in the care plan section) Acute Rehab OT Goals Patient Stated Goal: get stronger OT Goal Formulation: With patient Time For Goal Achievement: 05/27/20 Potential to Achieve Goals: Good  OT Frequency: Min 2X/week   Barriers to D/C:            Co-evaluation              AM-PAC OT "6 Clicks" Daily Activity     Outcome Measure Help from another person eating meals?: None Help from another person taking care of personal grooming?: A Little Help from another person toileting, which includes using toliet, bedpan, or urinal?: A Little Help from another person bathing (including  washing, rinsing, drying)?: A Little Help from another person to put on and taking off regular upper body clothing?: A Little Help from another person to put on and taking off regular lower body clothing?: A Little 6 Click Score: 19   End of Session Equipment Utilized During Treatment: Oxygen Nurse Communication: Mobility status  Activity Tolerance: Patient tolerated treatment well Patient left: in bed;with call bell/phone within reach;with family/visitor present  OT Visit Diagnosis: Muscle weakness (generalized) (M62.81)                Time: 9628-3662 OT Time Calculation (min): 34 min Charges:  OT General Charges $OT Visit: 1 Visit OT Evaluation $OT Eval Moderate Complexity: 1 Mod OT Treatments $Therapeutic Exercise: 8-22 mins  Manali Mcelmurry, OTR/L [place]  Office [phone] Pager: [phone]   Lenward Chancellor 05/13/2020, 2:00 PM

## 2020-05-14 LAB — CULTURE, BLOOD (ROUTINE X 2): Culture: NO GROWTH

## 2020-05-14 MED ORDER — PREDNISONE 5 MG PO TABS: 50.0000 mg | ORAL_TABLET | Freq: Every day | ORAL | Status: DC

## 2020-05-14 MED ORDER — INSULIN GLARGINE 100 UNIT/ML ~~LOC~~ SOLN
15.0000 [IU] | Freq: Every day | SUBCUTANEOUS | Status: DC
  Filled 2020-05-14: qty 0.15

## 2020-05-14 MED ORDER — LIVING WELL WITH DIABETES BOOK
Freq: Once | Status: AC
  Filled 2020-05-14: qty 1

## 2020-05-14 MED ORDER — PREDNISONE 20 MG PO TABS
60.0000 mg | ORAL_TABLET | Freq: Every day | ORAL | Status: DC
  Administered 2020-05-15: 60 mg via ORAL
  Filled 2020-05-14: qty 3

## 2020-05-14 MED ORDER — PREDNISONE 20 MG PO TABS: 40.0000 mg | ORAL_TABLET | Freq: Every day | ORAL | Status: DC

## 2020-05-14 NOTE — Progress Notes (Signed)
PROGRESS NOTE                                                                             PROGRESS NOTE                                                                                                                                                                                                             Patient Demographics:    Grace Moyer, is a 60 y.o. female, DOB - 05-22-1960, UDJ:[id number]  Outpatient Primary MD for the patient is Sharilyn Sites, MD    LOS - 5  Admit date - 05/08/2020    No chief complaint on file.      Brief Narrative    a59 y.o.female,with personal history of radiation, chemotherapy, lumpectomy, neuromyelitis optica, cryptogenic organizing pneumonia, and more who presented to the ER with a chief complaint of dyspnea.  Patient has had an overall decline in her condition from a pulmonary standpoint since she was first diagnosed with Covid in 2020.  She typically wears 4-5 L nasal cannula at home and was admitted with acute hypoxemic respiratory failure requiring 11 L nasal cannula.  She was also quite tachycardic on admission and is quite anxious as well.  She has had prior history of DVT/PE 2018 and completed her course of Xarelto.  She has also recently had CT PE study within the last month which was negative.  She has also had a 30 pound weight loss in the last several months due to her dyspnea and appears to be failing to thrive.  She was seen by her pulmonologist Dr. Vaughan Browner on 3/7 and was thought to have cryptogenic organizing pneumonia with possible opportunistic infections, but she was not stable enough for bronchoscopy.  She was started on Imuran and was told to continue her prednisone, however it appears the patient self tapered off of this.  She has been seen by pulmonology, Dr. Melvyn Novas at [facility] today who recommends transfer to [facility] for further evaluation   Subjective:    Grace Marker  Moyer today denies any chest  pain, reports dyspnea has improved, mainly with exertion, reports generalized weakness and fatigue, as well reports cough. .    Assessment  & Plan :    Active Problems:   History of COVID-19   Transaminitis   Cryptogenic organizing pneumonia ([place])   Acute respiratory failure with hypoxia (HCC)   Lactic acidosis   Sepsis due to pneumonia ([place])   Protein-calorie malnutrition, severe   Pressure injury of skin     Acute on chronic hypoxemic respiratory failure  -patient with baseline respiratory failure with underlining organizing pneumonia, with possible superimposed opportunistic infection, and recent COVID-19 infection . -Pulmonary input greatly appreciated, treated with IV Solu-Medrol, currently transitioned to p.o. prednisone taper, continue with Bactrim prophylaxis as well.. -Continue with Imuran 50 mg oral daily  -Wean oxygen as tolerated . - fungitell and Aspergillus ag negative.  -Patient with history of recurrent Covid infection, and vaccine, but her Covid IgG is nonreactive, this is related to her immunosuppression with steroids and Rituxan, unclear what is driving factor of her worsening respiratory failure possibly related to COVID-19 infection, versus chronic Rituxan therapy, but it appears to be improving with steroids, so at this point pulmonary opportunistic infection is unlikely, so antibiotic has been discontinued. -Patient Covid IgG is nonreactive, despite having recurrent COVID-19 positivity and vaccination, this is related to her immunosuppression with Rituxan and steroids, patient received monoclonal antibody.  Severe sepsis secondary to pneumonia-resolving -Abscess present on admission with lactic acid 2.9 tachycardic in the 150s, tachypneic with respiratory rate at 27, febrile 101.6. -Antibiotic discontinued today.  Diabetes mellitus, new diagnosis with hyperglycemia, Likely induced by steroids -A1c is 8.1, she is with hyperglycemia, better controlled with Lantus  and NovoLog, change her diet to carb modified and Glucerna instead of Ensure, she was started on Metformin today as well.  Sinus tachycardia secondary to above-resolving -Continue telemetry monitoring -Appears to be improving with IV fluid hydration and improvement in dyspnea -venous Dopplers negative for DVT  Mild transaminitis -Continue to monitor in a.m.  Anemia of chronic illness  Weight loss and failure to thrive -Consider palliative evaluation  History of neuromyelitis optica -Takes rituximab at home, usually takes it every 98-month, next dose is in April.  Deconditioning -With significant generalized weakness, and severe deconditioning, this is related to her prolonged steroid use, as well due to hypoxia and dyspnea, discussed with PT, will encourage activity, even if she needs increased oxygen requirement with activity.  COVID 19+ -She was diagnosed recently/08/2020 (third episode first was in November 2020, second was in August 2021, third was in February of this year), she is>21 days of her recent infection, no indication for isolation, will discontinue her isolation -Patient Covid IgG is nonreactive, despite having recurrent COVID-19 positivity and vaccination, this is related to her immunosuppression with Rituxan and steroids, patient received monoclonal antibody.  SpO2: 95 % O2 Flow Rate (L/min): 1 L/min  Recent Labs  Lab 05/08/20 2334 05/09/20 0044 05/09/20 0519 05/09/20 0739 05/09/20 1215 05/10/20 0305 05/10/20 1507 05/11/20 0106 05/12/20 0351  WBC 8.5  --  4.7  --   --  5.0  --  7.1 7.0  PLT 175  --  132*  --   --  134*  --  141* 155  CRP  --   --   --   --   --   --  9.5*  --   --   BNP  --  67.0  --   --   --   --   --   --   --  DDIMER  --   --   --  1.12*  --   --   --   --   --   PROCALCITON  --   --  0.16  --   --  0.37  --  0.27  --   AST 65*  --  44*  --  39  --   --   --   --   ALT 122*  --  92*  --  97*  --   --   --   --   ALKPHOS 188*  --   135*  --  148*  --   --   --   --   BILITOT 1.0  --  0.7  --  0.6  --   --   --   --   ALBUMIN 3.4*  --  2.4*  --  2.6*  --   --   --   --   INR 1.0  --   --   --   --   --   --   --   --   LATICACIDVEN 2.9* 1.7  --   --   --  1.0  --   --   --   SARSCOV2NAA POSITIVE*  --   --   --   --   --   --   --   --        ABG     Component Value Date/Time   HCO3 24.3 05/09/2020 0519   TCO2 25 08/15/2016 1234   ACIDBASEDEF 0.1 05/09/2020 0519   O2SAT 95.9 05/09/2020 0519          Condition - Extremely Guarded  Family Communication  :  Daughter at bedside  Code Status :  Full  Consults  :  PCCM  Procedures  :  none   Disposition Plan  :    Status is: Inpatient  Remains inpatient appropriate because:IV treatments appropriate due to intensity of illness or inability to take PO   Dispo: The patient is from: Home              Anticipated d/c is to: Home              Patient currently is not medically stable to d/c.   Difficult to place patient No      DVT Prophylaxis  :   Heparin   Lab Results  Component Value Date   PLT 155 05/12/2020    Diet :  Diet Order            Diet Carb Modified Fluid consistency: Thin; Room service appropriate? No  Diet effective now                  Inpatient Medications  Scheduled Meds: . ascorbic acid  500 mg Oral Daily  . aspirin  81 mg Oral Daily  . azaTHIOprine  50 mg Oral Daily  . feeding supplement (GLUCERNA SHAKE)  237 mL Oral TID BM  . fluticasone furoate-vilanterol  1 puff Inhalation Daily  . gabapentin  300 mg Oral QHS  . heparin  5,000 Units Subcutaneous Q8H  . insulin aspart  0-15 Units Subcutaneous TID WC  . insulin aspart  0-5 Units Subcutaneous QHS  . insulin aspart  5 Units Subcutaneous TID WC  . melatonin  6 mg Oral QHS  . metFORMIN  500 mg Oral BID WC  . multivitamin with minerals  1 tablet Oral Daily  .  pantoprazole  40 mg Oral Daily  . predniSONE  60 mg Oral Q breakfast   Followed by  . [START ON  05/21/2020] predniSONE  40 mg Oral Q breakfast   Followed by  . [START ON 06/04/2020] predniSONE  20 mg Oral Q breakfast  . sulfamethoxazole-trimethoprim  1 tablet Oral Once per day on Mon Wed Fri  . zinc sulfate  220 mg Oral Daily   Continuous Infusions:  PRN Meds:.acetaminophen, ALPRAZolam, guaiFENesin, Ipratropium-Albuterol, simethicone, sodium chloride  Antibiotics  :    Anti-infectives (From admission, onward)   Start     Dose/Rate Route Frequency Ordered Stop   05/11/20 1000  sulfamethoxazole-trimethoprim (BACTRIM DS) 800-160 MG per tablet 1 tablet       Note to Pharmacy: Patient taking differently: Mon, Wed, Fri     1 tablet Oral Once per day on Mon Wed Fri 05/10/20 0707     05/10/20 0000  vancomycin (VANCOREADY) IVPB 1500 mg/300 mL  Status:  Discontinued        1,500 mg 150 mL/hr over 120 Minutes Intravenous Every 24 hours 05/09/20 0204 05/09/20 1201   05/09/20 0400  ceFEPIme (MAXIPIME) 2 g in sodium chloride 0.9 % 100 mL IVPB  Status:  Discontinued        2 g 200 mL/hr over 30 Minutes Intravenous Every 8 hours 05/09/20 0204 05/11/20 1102   05/09/20 0215  vancomycin (VANCOREADY) IVPB 1500 mg/300 mL        1,500 mg 150 mL/hr over 120 Minutes Intravenous  Once 05/09/20 0204 05/09/20 0450   05/08/20 2345  cefTRIAXone (ROCEPHIN) 2 g in sodium chloride 0.9 % 100 mL IVPB  Status:  Discontinued        2 g 200 mL/hr over 30 Minutes Intravenous Every 24 hours 05/08/20 2334 05/09/20 0201   05/08/20 2345  azithromycin (ZITHROMAX) 500 mg in sodium chloride 0.9 % 250 mL IVPB  Status:  Discontinued        500 mg 250 mL/hr over 60 Minutes Intravenous Every 24 hours 05/08/20 2334 05/09/20 0201        Emeline Gins Kalandra Masters M.D on 05/14/2020 at 1:47 PM  To page go to [email or web address]   [facility] -  Office  [phone]       Objective:   Vitals:   05/14/20 0347 05/14/20 0800 05/14/20 0826 05/14/20 1318  BP: 95/72 113/67  95/65  Pulse: 81 81    Resp: 19 20  20   Temp: 98.4 F  (36.9 C) 98.6 F (37 C)  98.2 F (36.8 C)  TempSrc: Axillary Oral  Oral  SpO2: 100%  95% 95%  Weight:      Height:        Wt Readings from Last 3 Encounters:  05/14/20 72.5 kg  05/07/20 68.4 kg  04/08/20 68 kg     Intake/Output Summary (Last 24 hours) at 05/14/2020 1347 Last data filed at 05/13/2020 2000 Gross per 24 hour  Intake --  Output 1900 ml  Net -1900 ml     Physical Exam  Awake Alert, Oriented X 3, No new F.N deficits, Normal affect Symmetrical Chest wall movement, Good air movement bilaterally, scattered rales RRR,No Gallops,Rubs or new Murmurs, No Parasternal Heave +ve B.Sounds, Abd Soft, No tenderness, No rebound - guarding or rigidity. No Cyanosis, Clubbing or edema, No new Rash or bruise     Data Review:    CBC Recent Labs  Lab 05/08/20 2334 05/09/20 0519 05/10/20 0305 05/11/20 0106 05/12/20 0351  WBC 8.5 4.7  5.0 7.1 7.0  HGB 13.5 10.3* 9.9* 9.7* 9.8*  HCT 42.2 33.4* 29.8* 29.1* 30.6*  PLT 175 132* 134* 141* 155  MCV 88.5 90.8 86.4 86.4 88.4  MCH 28.3 28.0 28.7 28.8 28.3  MCHC 32.0 30.8 33.2 33.3 32.0  RDW 16.8* 16.9* 16.7* 16.6* 16.4*  LYMPHSABS 0.2*  --   --   --   --   MONOABS 0.1  --   --   --   --   EOSABS 0.0  --   --   --   --   BASOSABS 0.0  --   --   --   --     Recent Labs  Lab 05/08/20 2334 05/09/20 0044 05/09/20 0519 05/09/20 0739 05/09/20 1215 05/10/20 0305 05/10/20 1507 05/11/20 0106 05/12/20 0351  NA 133*  --  137  --  133*  --   --  137 137  K 3.9  --  3.5  --  3.6  --   --  3.6 4.1  CL 96*  --  105  --  101  --   --  102 102  CO2 24  --  23  --  22  --   --  27 28  GLUCOSE 226*  --  224*  --  343*  --   --  164* 209*  BUN 18  --  14  --  15  --   --  20 14  CREATININE 0.58  --  0.47  --  0.40*  --   --  0.44 0.54  CALCIUM 8.6*  --  7.7*  --  8.1*  --   --  8.7* 8.8*  AST 65*  --  44*  --  39  --   --   --   --   ALT 122*  --  92*  --  97*  --   --   --   --   ALKPHOS 188*  --  135*  --  148*  --   --   --    --   BILITOT 1.0  --  0.7  --  0.6  --   --   --   --   ALBUMIN 3.4*  --  2.4*  --  2.6*  --   --   --   --   MG  --   --   --   --   --  1.8  --   --   --   CRP  --   --   --   --   --   --  9.5*  --   --   DDIMER  --   --   --  1.12*  --   --   --   --   --   PROCALCITON  --   --  0.16  --   --  0.37  --  0.27  --   LATICACIDVEN 2.9* 1.7  --   --   --  1.0  --   --   --   INR 1.0  --   --   --   --   --   --   --   --   HGBA1C 7.9*  --   --   --   --   --   --  8.1*  --   BNP  --  67.0  --   --   --   --   --   --   --     ------------------------------------------------------------------------------------------------------------------  No results for input(s): CHOL, HDL, LDLCALC, TRIG, CHOLHDL, LDLDIRECT in the last 72 hours.  Lab Results  Component Value Date   HGBA1C 8.1 (H) 05/11/2020   ------------------------------------------------------------------------------------------------------------------ No results for input(s): TSH, T4TOTAL, T3FREE, THYROIDAB in the last 72 hours.  Invalid input(s): FREET3  Cardiac Enzymes No results for input(s): CKMB, TROPONINI, MYOGLOBIN in the last 168 hours.  Invalid input(s): CK ------------------------------------------------------------------------------------------------------------------    Component Value Date/Time   BNP 67.0 05/09/2020 0044    Micro Results Recent Results (from the past 240 hour(s))  Resp Panel by RT-PCR (Flu A&B, Covid) Nasopharyngeal Swab     Status: Abnormal   Collection Time: 05/08/20 11:34 PM   Specimen: Nasopharyngeal Swab; Nasopharyngeal(NP) swabs in vial transport medium  Result Value Ref Range Status   SARS Coronavirus 2 by RT PCR POSITIVE (A) NEGATIVE Final    Comment: RESULT CALLED TO, READ BACK BY AND VERIFIED WITH: H SPENCE,RN@0055  05/09/20 MKELLY (NOTE) SARS-CoV-2 target nucleic acids are DETECTED.  The SARS-CoV-2 RNA is generally detectable in upper respiratory specimens during the acute  phase of infection. Positive results are indicative of the presence of the identified virus, but do not rule out bacterial infection or co-infection with other pathogens not detected by the test. Clinical correlation with patient history and other diagnostic information is necessary to determine patient infection status. The expected result is Negative.  Fact Sheet for Patients: EntrepreneurPulse.com.au  Fact Sheet for Healthcare Providers: IncredibleEmployment.be  This test is not yet approved or cleared by the [place] FDA and  has been authorized for detection and/or diagnosis of SARS-CoV-2 by FDA under an Emergency Use Authorization (EUA).  This EUA will remain in effect (meaning this test can be  used) for the duration of  the COVID-19 declaration under Section 564(b)(1) of the Act, 21 U.S.C. section 360bbb-3(b)(1), unless the authorization is terminated or revoked sooner.     Influenza A by PCR NEGATIVE NEGATIVE Final   Influenza B by PCR NEGATIVE NEGATIVE Final    Comment: (NOTE) The Xpert Xpress SARS-CoV-2/FLU/RSV plus assay is intended as an aid in the diagnosis of influenza from Nasopharyngeal swab specimens and should not be used as a sole basis for treatment. Nasal washings and aspirates are unacceptable for Xpert Xpress SARS-CoV-2/FLU/RSV testing.  Fact Sheet for Patients: EntrepreneurPulse.com.au  Fact Sheet for Healthcare Providers: IncredibleEmployment.be  This test is not yet approved or cleared by the [place] FDA and has been authorized for detection and/or diagnosis of SARS-CoV-2 by FDA under an Emergency Use Authorization (EUA). This EUA will remain in effect (meaning this test can be used) for the duration of the COVID-19 declaration under Section 564(b)(1) of the Act, 21 U.S.C. section 360bbb-3(b)(1), unless the authorization is terminated or revoked.  Performed at [facility], [address], [place], [place] [address]   Blood Culture (routine x 2)     Status: None   Collection Time: 05/09/20 12:44 AM   Specimen: BLOOD RIGHT HAND  Result Value Ref Range Status   Specimen Description BLOOD RIGHT HAND  Final   Special Requests   Final    BOTTLES DRAWN AEROBIC AND ANAEROBIC Blood Culture results may not be optimal due to an excessive volume of blood received in culture bottles   Culture   Final    NO GROWTH 5 DAYS Performed at [facility], [address] [place], [place] [address]    Report Status 05/14/2020 FINAL  Final  MRSA PCR Screening     Status: None  Collection Time: 05/09/20  3:16 AM   Specimen: Nasal Mucosa; Nasopharyngeal  Result Value Ref Range Status   MRSA by PCR NEGATIVE NEGATIVE Final    Comment:        The GeneXpert MRSA Assay (FDA approved for NASAL specimens only), is one component of a comprehensive MRSA colonization surveillance program. It is not intended to diagnose MRSA infection nor to guide or monitor treatment for MRSA infections. Performed at [facility], [address] [place], [place] [address]   Urine culture     Status: None   Collection Time: 05/10/20 12:38 AM   Specimen: In/Out Cath Urine  Result Value Ref Range Status   Specimen Description IN/OUT CATH URINE  Final   Special Requests NONE  Final   Culture   Final    NO GROWTH Performed at [facility], [place] [address] [place], [place] [address]    Report Status 05/11/2020 FINAL  Final  Blood Culture (routine x 2)     Status: None (Preliminary result)   Collection Time: 05/10/20  3:11 AM   Specimen: BLOOD RIGHT FOREARM  Result Value Ref Range Status   Specimen Description BLOOD RIGHT FOREARM  Final   Special Requests AEROBIC BOTTLE ONLY Blood Culture adequate volume  Final   Culture   Final    NO GROWTH 4 DAYS Performed at [facility], [place] [address], [place], [place] [address]    Report Status PENDING  Incomplete  Respiratory (~20  pathogens) panel by PCR     Status: None   Collection Time: 05/10/20  7:59 AM   Specimen: Nasopharyngeal Swab; Respiratory  Result Value Ref Range Status   Adenovirus NOT DETECTED NOT DETECTED Final   Coronavirus 229E NOT DETECTED NOT DETECTED Final    Comment: (NOTE) The Coronavirus on the Respiratory Panel, DOES NOT test for the novel  Coronavirus (2019 nCoV)    Coronavirus HKU1 NOT DETECTED NOT DETECTED Final   Coronavirus NL63 NOT DETECTED NOT DETECTED Final   Coronavirus OC43 NOT DETECTED NOT DETECTED Final   Metapneumovirus NOT DETECTED NOT DETECTED Final   Rhinovirus / Enterovirus NOT DETECTED NOT DETECTED Final   Influenza A NOT DETECTED NOT DETECTED Final   Influenza B NOT DETECTED NOT DETECTED Final   Parainfluenza Virus 1 NOT DETECTED NOT DETECTED Final   Parainfluenza Virus 2 NOT DETECTED NOT DETECTED Final   Parainfluenza Virus 3 NOT DETECTED NOT DETECTED Final   Parainfluenza Virus 4 NOT DETECTED NOT DETECTED Final   Respiratory Syncytial Virus NOT DETECTED NOT DETECTED Final   Bordetella pertussis NOT DETECTED NOT DETECTED Final   Bordetella Parapertussis NOT DETECTED NOT DETECTED Final   Chlamydophila pneumoniae NOT DETECTED NOT DETECTED Final   Mycoplasma pneumoniae NOT DETECTED NOT DETECTED Final    Comment: Performed at [facility], [place]. [address] [place], [place] [address]  Urine Culture     Status: None   Collection Time: 05/10/20  2:33 PM   Specimen: Urine, Random  Result Value Ref Range Status   Specimen Description URINE, RANDOM  Final   Special Requests NONE  Final   Culture   Final    NO GROWTH Performed at [facility], [place] [address], [place],  [address]    Report Status 05/12/2020 FINAL  Final    Radiology Reports US Venous Img Lower Bilateral (DVT)  Result Date: 05/09/2020 CLINICAL DATA:  Right leg pain, positive D-dimer EXAM: BILATERAL LOWER EXTREMITY VENOUS DOPPLER ULTRASOUND TECHNIQUE: Gray-scale  sonography with graded  compression, as well as color Doppler and duplex ultrasound were performed to evaluate the lower extremity deep venous systems from the level of the common femoral vein and including the common femoral, femoral, profunda femoral, popliteal and calf veins including the posterior tibial, peroneal and gastrocnemius veins when visible. The superficial great saphenous vein was also interrogated. Spectral Doppler was utilized to evaluate flow at rest and with distal augmentation maneuvers in the common femoral, femoral and popliteal veins. COMPARISON:  None. FINDINGS: RIGHT LOWER EXTREMITY Common Femoral Vein: No evidence of thrombus. Normal compressibility, respiratory phasicity and response to augmentation. Saphenofemoral Junction: No evidence of thrombus. Normal compressibility and flow on color Doppler imaging. Profunda Femoral Vein: No evidence of thrombus. Normal compressibility and flow on color Doppler imaging. Femoral Vein: No evidence of thrombus. Normal compressibility, respiratory phasicity and response to augmentation. Popliteal Vein: No evidence of thrombus. Normal compressibility, respiratory phasicity and response to augmentation. Calf Veins: No evidence of thrombus. Normal compressibility and flow on color Doppler imaging. LEFT LOWER EXTREMITY Common Femoral Vein: No evidence of thrombus. Normal compressibility, respiratory phasicity and response to augmentation. Saphenofemoral Junction: No evidence of thrombus. Normal compressibility and flow on color Doppler imaging. Profunda Femoral Vein: No evidence of thrombus. Normal compressibility and flow on color Doppler imaging. Femoral Vein: No evidence of thrombus. Normal compressibility, respiratory phasicity and response to augmentation. Popliteal Vein: No evidence of thrombus. Normal compressibility, respiratory phasicity and response to augmentation. Calf Veins: No evidence of thrombus. Normal compressibility and flow on color Doppler imaging. IMPRESSION:  No evidence of deep venous thrombosis in either lower extremity. Electronically Signed   By: Jerilynn Mages.  Shick M.D.   On: 05/09/2020 14:06   DG Chest Port 1 View  Result Date: 05/08/2020 CLINICAL DATA:  Shortness of breath. EXAM: PORTABLE CHEST 1 VIEW COMPARISON:  Radiograph and CT 04/08/2020 FINDINGS: Lung volumes are low. Upper normal heart size. Unchanged mediastinal contours. Patchy lower lobe predominant opacities that have increased from prior exam. No pneumothorax or pleural effusion. No pulmonary edema. No acute osseous abnormalities are seen. IMPRESSION: Patchy lower lobe predominant opacities have increased from prior exam. This may represent organizing pneumonia post COVID. Electronically Signed   By: Keith Rake M.D.   On: 05/08/2020 23:55

## 2020-05-14 NOTE — Progress Notes (Signed)
Occupational Therapy Treatment Patient Details Name: Grace Moyer MRN: [id number] DOB: 1960/11/19 Today's Date: 05/14/2020    History of present illness Renita is a 60 y/o female who presented to ED with dyspnea. Pt was was admitted on 05/12/20 and diagnosed with sepsis due to PNA. Was also found to be Covid (+), however pt was removed from isolation precautions. Pt originally had covid-19 in 2020 and since has had worsening pulmonary function. This is the 4th time she has tested positive for covid. PMH includes colon and breast cancer s/p lumpectomy, neuromyelitis optica, history of PNA, and history of PE & DVT.   OT comments  Pt making steady progress towards OT goals this session. Pt continues to present with dyspnea with exertion, decreased activity tolerance and generalized deconditioning impacting pts ability to complete BADLs independently.Session focus on increasing activity tolerance and BUE strength and endurance through therapeutic exercise. Pt completed therex as indicated below with no reports of increased pain with level 1 theraband. Pt additionally able to ambulate into BR with no AD with min guard assist. Pt on 1L at start of session with pt able to maintain sats > 88% during seated therex. Pt requried 3L to ambulate to BR with HR increasing to as much as 140 bpm with mobility and SpO2 briefly dropping to 82% during ambulation, RR increase to as much as 41 breaths per minute. Able to titrate pt back down to 1L once pt back in recliner after toileting tasks. Pt would continue to benefit from skilled occupational therapy while admitted and after d/c to address the below listed limitations in order to improve overall functional mobility and facilitate independence with BADL participation. DC plan remains appropriate, will follow acutely per POC.     Follow Up Recommendations  Home health OT    Equipment Recommendations  None recommended by OT    Recommendations for Other Services       Precautions / Restrictions Precautions Precautions: Fall Precaution Comments: monitor O2, high anxiety Restrictions Weight Bearing Restrictions: No       Mobility Bed Mobility               General bed mobility comments: pt OOB in recliner and returned to recliner at end of session    Transfers Overall transfer level: Needs assistance Equipment used: None Transfers: Sit to/from Stand Sit to Stand: Min guard         General transfer comment: min guard to rise from recliner and toilet seat with use of grab bar    Balance Overall balance assessment: Needs assistance Sitting-balance support: Feet supported;No upper extremity supported Sitting balance-Leahy Scale: Fair     Standing balance support: No upper extremity supported;Single extremity supported Standing balance-Leahy Scale: Poor Standing balance comment: static stand without support, furniture walking                           ADL either performed or assessed with clinical judgement   ADL Overall ADL's : Needs assistance/impaired                         Toilet Transfer: Min guard;Ambulation;Regular Toilet;Grab bars Toilet Transfer Details (indicate cue type and reason): pt able to ambulate into BR with no AD and min guard assist for safety, cues throughout ambulation for PLB technique Toileting- Clothing Manipulation and Hygiene: Minimal assistance;Sit to/from stand Toileting - Clothing Manipulation Details (indicate cue type and reason): light  MIN  A to manage pts gown while pt pulled underwear up to waist line     Functional mobility during ADLs: Min guard;Cueing for safety General ADL Comments: pt continues to present with dyspnea with exertion,decreased activity tolerance and generalized deconditioning impacting pts ability to complete BADLs independently.     Vision       Perception     Praxis      Cognition Arousal/Alertness: Awake/alert Behavior During Therapy:  WFL for tasks assessed/performed Overall Cognitive Status: Within Functional Limits for tasks assessed                                 General Comments: mildly anxious but improved in comparision to previous OT notes        Exercises General Exercises - Upper Extremity Shoulder Flexion: AROM;Both;5 reps;Seated;Theraband Theraband Level (Shoulder Flexion): Level 1 (Yellow) Shoulder Extension: AROM;Both;5 reps;Seated;Theraband Theraband Level (Shoulder Extension): Level 1 (Yellow) Shoulder Horizontal ABduction: AROM;Both;5 reps;Seated;Theraband Theraband Level (Shoulder Horizontal Abduction): Level 1 (Yellow) Shoulder Horizontal ADduction: AROM;Both;5 reps;Seated;Theraband Theraband Level (Shoulder Horizontal Adduction): Level 1 (Yellow) Elbow Flexion: AROM;Both;5 reps;Seated;Theraband Theraband Level (Elbow Flexion): Level 1 (Yellow) Elbow Extension: AROM;Both;5 reps;Seated;Theraband Theraband Level (Elbow Extension): Level 1 (Yellow) Other Exercises Other Exercises: punches with BUEs with level 1 theraband x10 reps   Shoulder Instructions       General Comments pt on 1L at start of session with pt able to maintain sats > 88% during seated therex. pt requried 3L to ambulate to BR with HR increasing to as much as 140 bpm with mobility, RR increase to as much as 41 breaths per minute. Able to titrate pt back down to 1L once pt back in recliner after toileting tasks. issued pt written BUE HEP with level 1 theraband    Pertinent Vitals/ Pain       Pain Assessment: No/denies pain  Home Living                                          Prior Functioning/Environment              Frequency  Min 2X/week        Progress Toward Goals  OT Goals(current goals can now be found in the care plan section)  Progress towards OT goals: Progressing toward goals  Acute Rehab OT Goals Patient Stated Goal: get stronger OT Goal Formulation: With  patient Time For Goal Achievement: 05/27/20 Potential to Achieve Goals: Good  Plan Discharge plan remains appropriate;Frequency remains appropriate    Co-evaluation                 AM-PAC OT "6 Clicks" Daily Activity     Outcome Measure   Help from another person eating meals?: None Help from another person taking care of personal grooming?: A Little Help from another person toileting, which includes using toliet, bedpan, or urinal?: A Little Help from another person bathing (including washing, rinsing, drying)?: A Little Help from another person to put on and taking off regular upper body clothing?: None Help from another person to put on and taking off regular lower body clothing?: A Little 6 Click Score: 20    End of Session Equipment Utilized During Treatment: Oxygen;Other (comment) (1-3L ; level 1 theraband)  OT Visit Diagnosis: Muscle weakness (generalized) (M62.81)   Activity Tolerance Patient tolerated  treatment well   Patient Left in chair;with call bell/phone within reach;with family/visitor present   Nurse Communication Mobility status        Time: 1457-1530 OT Time Calculation (min): 33 min  Charges: OT General Charges $OT Visit: 1 Visit OT Treatments $Self Care/Home Management : 8-22 mins $Therapeutic Exercise: 8-22 mins  Harley Alto., COTA/L Acute Rehabilitation Services [place] 05/14/2020, 4:45 PM

## 2020-05-14 NOTE — Progress Notes (Signed)
NAME:  Grace Moyer, MRN:  [id number], DOB:  May 23, 1960, LOS: 5 ADMISSION DATE:  05/08/2020, CONSULTATION DATE:  05/09/20 REFERRING MD:  Manuella Ghazi, triad  CHIEF COMPLAINT:  resp failure post covid   Brief History:  29 yowf remote smoker on Rituxan x 2017 from [facility] for neuromyelitis optica with ? Recurrent (vs persistent) covid 19 pna vs organizing pna seen in office 3/7 by Dr Vaughan Browner and rec wean pred and start Imuran but misunderstood instructions and stopped pred 3/7 from a dose of 20 mg to zero (had taken Prednisone 60 mg day prior ) and acutely deteriorated pm 3/8 with fever and sob and readmitted with acute on chronic resp failure and PCCM consulted am 3/9   History of Present Illness:  60 y.o. female, with personal history of radiation, chemotherapy, lumpectomy, neuromyelitis optica, cryptogenic organizing pneumonia, and more presented to the ER  Pm 3/8 with a chief complaint of dyspnea.  Since patient first was diagnosed with Covid in 2020 she has had a decline in her pulmonary health.  Patient most recently has been on 5 L nasal cannula at rest and 7 L nasal cannula with any exertion at home.  Today she could not get her oxygen saturations up past 85% even on 10 L nasal cannula at rest.  Her heart rate was 150.  She was not having coughing spells, they report that she is coughed less lately than in the past.  She did feel feverish and chilled but she did not have chest pain.  Patient reported no rhinorrhea, no body aches, no sore throat.  Patient does have a history of DVT and PE in 2018 that were provoked, and she was on Xarelto for 6 months after that.  She had a PE study 1 month PTA  that was negative.    Patient does report that she has been immobile at home due to dyspnea.  She is also had a 30 pound weight loss in the last several months, due to dyspnea. Patient reports that she is hungry, but she gets so short of breath when she tries to eat that she loses interest in food.  Patient has no  other complaints at this time.  Past Medical History:   Anxiety  Breast cancer Breast cancer  Neuromyelitis optica   history of chemotherapy  history of radiation therapy.   Significant Hospital Events:    Consults:  PCCM 3/9   Procedures:    Significant Diagnostic Tests:    Micro Data:  Resp Panel  PCR 3/8 > neg flu/ POS COVID 19 MRSA PCR  3/9 neg  BC x 2  3/9 >>>    Antimicrobials:  Zmax 3/8 Cefepime 3/8 Rocephin 3/8 Vanc 3/8  Bactrim 3/11-   Scheduled Meds: . ascorbic acid  500 mg Oral Daily  . aspirin  81 mg Oral Daily  . azaTHIOprine  50 mg Oral Daily  . Chlorhexidine Gluconate Cloth  6 each Topical Daily  . feeding supplement (GLUCERNA SHAKE)  237 mL Oral TID BM  . fluticasone furoate-vilanterol  1 puff Inhalation Daily  . gabapentin  300 mg Oral QHS  . heparin  5,000 Units Subcutaneous Q8H  . insulin aspart  0-15 Units Subcutaneous TID WC  . insulin aspart  0-5 Units Subcutaneous QHS  . insulin aspart  5 Units Subcutaneous TID WC  . insulin glargine  15 Units Subcutaneous Daily  . melatonin  6 mg Oral QHS  . metFORMIN  500 mg Oral BID WC  .  multivitamin with minerals  1 tablet Oral Daily  . oxymetazoline  1 spray Each Nare BID  . pantoprazole  40 mg Oral Daily  . predniSONE  60 mg Oral Q breakfast   Followed by  . [START ON 05/21/2020] predniSONE  40 mg Oral Q breakfast   Followed by  . [START ON 06/04/2020] predniSONE  20 mg Oral Q breakfast  . sulfamethoxazole-trimethoprim  1 tablet Oral Once per day on Mon Wed Fri  . zinc sulfate  220 mg Oral Daily   Continuous Infusions: . sodium chloride    . famotidine (PEPCID) IV     PRN Meds:.sodium chloride, acetaminophen, albuterol, ALPRAZolam, diphenhydrAMINE, EPINEPHrine, famotidine (PEPCID) IV, guaiFENesin, Ipratropium-Albuterol, simethicone, sodium chloride   Interim History / Subjective:   Oxygen requirement much improved to 2L [place], sitting up, eating breakfast  Objective   Blood pressure  113/67, pulse 81, temperature 98.6 F (37 C), temperature source Oral, resp. rate 20, height 5\' 3"  (1.6 m), weight 72.5 kg, SpO2 95 %.          Intake/Output Summary (Last 24 hours) at 05/14/2020 0827 Last data filed at 05/13/2020 2000 Gross per 24 hour  Intake -  Output 1900 ml  Net -1900 ml   Filed Weights   05/09/20 0300 05/13/20 0400 05/14/20 0345  Weight: 69.1 kg 72.5 kg 72.5 kg    General: Well-nourished female, sitting up in chair, able to speak in full sentences without any sign of distress HEENT: MM pink/moist Neuro: Awake, alert x3, moving all extremities CV: s1s2 RRR, no m/r/g PULM: Mildly decreased air movement in bilateral bases, no tachypnea, accessory muscle use, rhonchi, wheezing GI: soft, bsx4 active  Extremities: warm/dry, no edema  Skin: no rashes or lesions     Resolved Hospital Problem list      Assessment & Plan:   Acute on chronic hypoxemic resp failure  She has recurrent COVID positivity with neg COVID IgG related to her chronic immunosuppression with steroids and rituxan.  There is question of if this is driving her recurrent COP-type flares or if something else may be responsible as her parenchymal abnormalities predated her initial COVID infection (and actually appeared after.  Steroid responsive and unlikely that bronchoscopy would be high yield P: -Improved, back to baseline 2 L today, plan to work with physical therapy, continue to wean as able -Continue p.o. steroid taper and Bactrim, on Solu-Medrol 60 mg every 6hrs -S/p Covid-19 monoclonal antibodies -Culture results remain negative -Continue Imuran -Scented spirometer - Will follow with you         Otilio Carpen Gleason, PA-C [facility] See Amion for pager If no response to pager , please call [phone] until 7pm After 7:00 pm call Elink  [phone]?[place]

## 2020-05-14 NOTE — Progress Notes (Addendum)
Physical Therapy Treatment Patient Details Name: Grace Moyer MRN: [id number] DOB: Sep 16, 1960 Today's Date: 05/14/2020    History of Present Illness Grace Moyer is a 60 y/o female who presented to ED with dyspnea. Pt was was admitted on 05/12/20 and diagnosed with sepsis due to PNA. Was also found to be Covid (+), however pt was removed from isolation precautions. Pt originally had covid-19 in 2020 and since has had worsening pulmonary function. This is the 4th time she has tested positive for covid. PMH includes colon and breast cancer s/p lumpectomy, neuromyelitis optica, history of PNA, and history of PE & DVT.    PT Comments    Pt received in recliner, HR 85 and SpO2 96% on 2L. Pt required min guard assist transfers and ambulation 12' to/from bathroom. Pt required increased time for all functional mobility. Furniture walking in room. Mobilized on 4L. Max HR 141 during mobility. Continuous cues provided for pursed lip breathing, relaxation, and slowing RR. Pt able to maintain SpO2 > 90% on 4L until return to recliner. Upon sitting, desat to 85% requiring 2 minutes on 6L to recover to 90%. Pt then returned to 2L to complete LE exercises with feet elevated in recliner. Able to maintain SpO2 > 90% on 2L during exercises.   Pt/daughter report having transport chair at home and no need for standard wheelchair. DME recommendations updated.   Follow Up Recommendations  Home health PT     Equipment Recommendations  Other (comment) (rollator)    Recommendations for Other Services       Precautions / Restrictions Precautions Precautions: Fall Precaution Comments: monitor O2, high anxiety    Mobility  Bed Mobility               General bed mobility comments: Pt received in recliner and returned to recliner.    Transfers Overall transfer level: Needs assistance Equipment used: None Transfers: Sit to/from Stand Sit to Stand: Min guard         General transfer comment: cues  for sequencing, increased time  Ambulation/Gait Ambulation/Gait assistance: Min guard Gait Distance (Feet): 12 Feet Assistive device: None Gait Pattern/deviations: Step-through pattern;Decreased stride length Gait velocity: decreased Gait velocity interpretation: <1.8 ft/sec, indicate of risk for recurrent falls General Gait Details: furniture walking in room, increased time utilizing standing rest breaks   Stairs             Wheelchair Mobility    Modified Rankin (Stroke Patients Only)       Balance Overall balance assessment: Mild deficits observed, not formally tested         Standing balance support: No upper extremity supported;Single extremity supported Standing balance-Leahy Scale: Fair Standing balance comment: static stand without support, furniture walking                            Cognition Arousal/Alertness: Awake/alert Behavior During Therapy: WFL for tasks assessed/performed Overall Cognitive Status: Within Functional Limits for tasks assessed                                 General Comments: mildly anxious      Exercises General Exercises - Lower Extremity Ankle Circles/Pumps: AROM;Both;10 reps Quad Sets: AROM;Both;10 reps Gluteal Sets: AROM;Both;10 reps Heel Slides: AROM;Right;Left;5 reps Hip ABduction/ADduction: AROM;5 reps;Both    General Comments General comments (skin integrity, edema, etc.): SpO2 96% at rest on 2L  and  HR 85 on arrival. Mobilized on 4L. Able to maintain SpO2 > 90% on 4L for amb to/from bathroom and during time in bathroom. Max HR 141. Upon return to recliner, desat to 85% requiring 2-minutes on 6L to recover to 90%. Pt then returned to 2L. Able to maintain SpO2 > 90% on 2L during LE exercises in recliner. SpO2 93% at rest on 2L at end of session, HR 118.      Pertinent Vitals/Pain Pain Assessment: No/denies pain    Home Living                      Prior Function            PT  Goals (current goals can now be found in the care plan section) Acute Rehab PT Goals Patient Stated Goal: get stronger Progress towards PT goals: Progressing toward goals    Frequency    Min 3X/week      PT Plan Equipment recommendations need to be updated    Co-evaluation              AM-PAC PT "6 Clicks" Mobility   Outcome Measure  Help needed turning from your back to your side while in a flat bed without using bedrails?: A Little Help needed moving from lying on your back to sitting on the side of a flat bed without using bedrails?: A Little Help needed moving to and from a bed to a chair (including a wheelchair)?: A Little Help needed standing up from a chair using your arms (e.g., wheelchair or bedside chair)?: A Little Help needed to walk in hospital room?: A Little Help needed climbing 3-5 steps with a railing? : A Lot 6 Click Score: 17    End of Session Equipment Utilized During Treatment: Gait belt;Oxygen Activity Tolerance: Treatment limited secondary to medical complications (Comment) (desat) Patient left: in chair;with call bell/phone within reach;with family/visitor present Nurse Communication: Mobility status PT Visit Diagnosis: Unsteadiness on feet (R26.81);Muscle weakness (generalized) (M62.81)     Time: 3762-8315 PT Time Calculation (min) (ACUTE ONLY): 39 min  Charges:  $Gait Training: 23-37 mins $Therapeutic Exercise: 8-22 mins                     Lorrin Goodell, PT  Office # [phone] Pager [phone]    Grace Moyer 05/14/2020, 10:11 AM

## 2020-05-14 NOTE — Progress Notes (Signed)
Inpatient Diabetes Program Recommendations  AACE/ADA: New Consensus Statement on Inpatient Glycemic Control (2015)  Target Ranges:  Prepandial:   less than 140 mg/dL      Peak postprandial:   less than 180 mg/dL (1-2 hours)      Critically ill patients:  140 - 180 mg/dL   Lab Results  Component Value Date   GLUCAP 225 (H) 05/12/2020   HGBA1C 8.1 (H) 05/11/2020    Review of Glycemic Control  Diabetes history: No prior hx DM Current orders for Inpatient glycemic control: Novolog 5 units tid meal coverage + Metformin 500 mg bid + Novolog correction 0-15 units tid + 0-5 units hs  Inpatient Diabetes Program Recommendations:   Spoke with pt and daughter @ bedside about A1C results and explained what an A1C is, basic pathophysiology of DM Type 2, basic home care, basic diabetes diet nutrition principles, importance of checking CBGs and maintaining good CBG control to prevent long-term and short-term complications. Reviewed signs and symptoms of hyperglycemia and hypoglycemia and how to treat hypoglycemia at home. Also reviewed blood sugar goals at home.  RNs to provide ongoing basic DM education at bedside with this patient. Have ordered educational booklet Living Well With Diabetes. Reviewed basic nutrition with limiting sugary drinks and limiting carbohydrates. Patient states she has been on steroids x 3 months and discussed steroids increasing resistance and increases average CBGs. Patient currently has a continuous glucose monitor on to use in the hospital.  Thank you, Nani Gasser. Anthone Prieur, RN, MSN, CDE  Diabetes Coordinator Inpatient Glycemic Control Team Team Pager [phone] (8am-5pm) 05/14/2020 2:10 PM

## 2020-05-15 LAB — CULTURE, BLOOD (ROUTINE X 2)
Culture: NO GROWTH
Special Requests: ADEQUATE

## 2020-05-15 MED ORDER — ADULT MULTIVITAMIN W/MINERALS CH: 1.0000 | ORAL_TABLET | Freq: Every day | ORAL | 0 refills | Status: DC

## 2020-05-15 MED ORDER — METFORMIN HCL 500 MG PO TABS
1000.0000 mg | ORAL_TABLET | Freq: Two times a day (BID) | ORAL | Status: DC
  Administered 2020-05-15: 1000 mg via ORAL
  Filled 2020-05-15: qty 2

## 2020-05-15 MED ORDER — PREDNISONE 10 MG PO TABS: ORAL_TABLET | ORAL | 0 refills | Status: DC

## 2020-05-15 MED ORDER — ALPRAZOLAM 0.5 MG PO TABS: 0.5000 mg | ORAL_TABLET | Freq: Two times a day (BID) | ORAL | 0 refills | Status: DC | PRN

## 2020-05-15 MED ORDER — PANTOPRAZOLE SODIUM 40 MG PO TBEC: 40.0000 mg | DELAYED_RELEASE_TABLET | Freq: Every day | ORAL | 0 refills | Status: DC

## 2020-05-15 MED ORDER — METFORMIN HCL 1000 MG PO TABS: 1000.0000 mg | ORAL_TABLET | Freq: Two times a day (BID) | ORAL | 0 refills | Status: DC

## 2020-05-15 MED ORDER — SULFAMETHOXAZOLE-TRIMETHOPRIM 800-160 MG PO TABS: 1.0000 | ORAL_TABLET | ORAL | 0 refills | Status: DC

## 2020-05-15 MED ORDER — GLUCERNA SHAKE PO LIQD: 237.0000 mL | Freq: Three times a day (TID) | ORAL | 0 refills | Status: DC

## 2020-05-15 NOTE — TOC Transition Note (Addendum)
Transition of Care [facility]) - CM/SW Discharge Note   Patient Details  Name: Emmaly Leech MRN: [id number] Date of Birth: [birth year]  [facility] of [facility] CM/SW Contact:  Carles Collet, RN Phone Number: 05/15/2020, 11:57 AM   Clinical Narrative:    Damaris Schooner w patient at bedside, declines any needs for DME. Has home O2 w [place]. Dante referrals made to [facility]- declined [facility]- declined [facility]- declined [facility]- declined [facility]- declined [facility]- declined [facility]- declined [facility]- declined [facility]- declined  Was on with customer service for [employer] for 30 minutes who could not (did not know how to) identify any home health providers in network who serviced patient's zip code.   Discussed w patient and MD, referral made for OP PT OT at [facility].      Final next level of care: [facility] Barriers to Discharge: No [facility] will accept this patient   Patient Goals and CMS Choice Patient states their goals for this hospitalization and ongoing recovery are:: to go home w [facility] services   Choice offered to / list presented to : NA  Discharge Placement                       Discharge Plan and Services                                     Social Determinants of Health (SDOH) Interventions     Readmission Risk Interventions No flowsheet data found.

## 2020-05-15 NOTE — Discharge Instructions (Signed)
Follow with Primary MD Sharilyn Sites, MD in 7 days   Get CBC, CMP, 2 view Chest X ray checked  by Primary MD next visit.    Activity: As tolerated with Full fall precautions use walker/cane & assistance as needed   Disposition Home    Diet: Heart Healthy /carb modified with feeding assistance and aspiration precautions.  For Heart failure patients - Check your Weight same time everyday, if you gain over 2 pounds, or you develop in leg swelling, experience more shortness of breath or chest pain, call your Primary MD immediately. Follow Cardiac Low Salt Diet and 1.5 lit/day fluid restriction.   On your next visit with your primary care physician please Get Medicines reviewed and adjusted.   Please request your Prim.MD to go over all Hospital Tests and Procedure/Radiological results at the follow up, please get all Hospital records sent to your Prim MD by signing hospital release before you go home.   If you experience worsening of your admission symptoms, develop shortness of breath, life threatening emergency, suicidal or homicidal thoughts you must seek medical attention immediately by calling 911 or calling your MD immediately  if symptoms less severe.  You Must read complete instructions/literature along with all the possible adverse reactions/side effects for all the Medicines you take and that have been prescribed to you. Take any new Medicines after you have completely understood and accpet all the possible adverse reactions/side effects.   Do not drive, operating heavy machinery, perform activities at heights, swimming or participation in water activities or provide baby sitting services if your were admitted for syncope or siezures until you have seen by Primary MD or a Neurologist and advised to do so again.  Do not drive when taking Pain medications.    Do not take more than prescribed Pain, Sleep and Anxiety Medications  Special Instructions: If you have smoked or chewed  Tobacco  in the last 2 yrs please stop smoking, stop any regular Alcohol  and or any Recreational drug use.  Wear Seat belts while driving.   Please note  You were cared for by a hospitalist during your hospital stay. If you have any questions about your discharge medications or the care you received while you were in the hospital after you are discharged, you can call the unit and asked to speak with the hospitalist on call if the hospitalist that took care of you is not available. Once you are discharged, your primary care physician will handle any further medical issues. Please note that NO REFILLS for any discharge medications will be authorized once you are discharged, as it is imperative that you return to your primary care physician (or establish a relationship with a primary care physician if you do not have one) for your aftercare needs so that they can reassess your need for medications and monitor your lab values.

## 2020-05-15 NOTE — Progress Notes (Signed)
AVS reviewed with patient and daughter at bedside. All questions asked and answered. PIVs removed. Leaving via personal transportation with daughter, home O2 present. Cleared for DC.

## 2020-05-15 NOTE — Discharge Summary (Addendum)
Grace Moyer, is a 60 y.o. female  DOB [birth year]  MRN [id number].  Admission date:  05/08/2020  Admitting Physician  Rolla Plate, DO  Discharge Date:  05/15/2020   Primary MD  Sharilyn Sites, MD  Recommendations for primary care physician for things to follow:  -Please adjust oral hypoglycemic agent dosing as needed, likely CBG will improve once her steroid has been tapered.   Admission Diagnosis  Acute respiratory failure with hypoxia (HCC) [J96.01]   Discharge Diagnosis  Acute respiratory failure with hypoxia ([place]) [J96.01]    Active Problems:   History of COVID-19   Transaminitis   Cryptogenic organizing pneumonia ([place])   Acute respiratory failure with hypoxia (HCC)   Lactic acidosis   Sepsis due to pneumonia ([place])   Protein-calorie malnutrition, severe   Pressure injury of skin      Past Medical History:  Diagnosis Date  . Anxiety   . Breast cancer ([place])   . Breast cancer ([place])   . Family history of breast cancer   . Family history of colon cancer   . Malignant neoplasm of upper-outer quadrant of left female breast ([place]) 01/31/2016  . 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 Personal history of chemotherapy   . Personal history of radiation therapy     Past Surgical History:  Procedure Laterality Date  . ABDOMINAL HYSTERECTOMY     partial  . BREAST BIOPSY Left 2017  . BREAST LUMPECTOMY Left    2018  . BREAST LUMPECTOMY WITH RADIOACTIVE SEED AND SENTINEL LYMPH NODE BIOPSY Left 07/31/2016   Procedure: LEFT BREAST RADIOACTIVE SEED GUIDED LUMPECTOMY WITH LEFT RADIOACTIVE SEED TARGETED AXILLARY SENTINEL LYMPH NODE EXCISION AND SENTINEL LYMPH NODE BIOPSY;  Surgeon: Rolm Bookbinder, MD;  Location: [facility];  Service: General;  Laterality: Left;  . BREAST REDUCTION SURGERY Bilateral 08/05/2016   Procedure: LEFT ONCOPLASTIC REDUCTION; RIGHT BREAST REDUCTION;  Surgeon:  Irene Limbo, MD;  Location: [facility];  Service: Plastics;  Laterality: Bilateral;  . EYE SURGERY    . PORT-A-CATH REMOVAL Right 07/31/2016   Procedure: REMOVAL PORT-A-CATH;  Surgeon: Rolm Bookbinder, MD;  Location: [facility];  Service: General;  Laterality: Right;  . PORTACATH PLACEMENT Right 02/11/2016   Procedure: INSERTION PORT-A-CATH WITH [place];  Surgeon: Rolm Bookbinder, MD;  Location: [facility];  Service: General;  Laterality: Right;  . REDUCTION MAMMAPLASTY         History of present illness and  Hospital Course:     Kindly see H&P for history of present illness and admission details, please review complete Labs, Consult reports and Test reports for all details in brief  HPI  from the history and physical done on the day of admission 05/08/2020    Grace Moyer  is a 60 y.o. female, with personal history of radiation, chemotherapy, lumpectomy, neuromyelitis optica, cryptogenic organizing pneumonia, and more presents to the ER with a chief complaint of dyspnea.  Since patient first was diagnosed with Covid in 2020 she has had  a decline in her pulmonary health.  Patient most recently has been on 5 L nasal cannula at rest and 7 L nasal cannula with any exertion at home.  Today she could not get her oxygen saturations up past 85% even on 10 L nasal cannula at rest.  Her heart rate was 150.  She was not having coughing spells, they report that she is coughed less lately than in the past.  She did feel feverish and chilled, and she did not have chest pain.  Patient reports no rhinorrhea, no body aches, no sore throat.  Patient does have a history of DVT and PE in 2018 that were provoked, and she was on Xarelto for 6 months after that.  She had a PE study 1 month ago that was negative.  We discussed cost benefit ratio for PE study today, and patient feels that she does not have the same sharp pleuritic pain or tightness in the back of her legs  that she had when she had a DVT and PE, so she wants to put this CT PE scan off and see if her saturations and tachycardia corrected with current treatments.  Patient does report that she has been immobile at home due to dyspnea.  She is also had a 30 pound weight loss in the last several months, due to dyspnea.  Patient reports that she is hungry, but she gets so short of breath when she tries to eat that she loses interest in food.  Patient has no other complaints at this time.  Yesterday patient was seen in pulmonology office.  They are still considering cryptogenic organizing pneumonia, possible opportunistic infections.  They are not doing a bronchoscopy because she is not stable enough per the note.  They noted that she had failed prednisone outpatient, so they started her on Imuran, azathioprine, told her to continue prednisone, and continue duo nebs.  He also advised her if she had any kind of fever or reaction to the azathioprine to come straight to the ER.  Patient quit smoking in 2001, she does not drink alcohol, she does not use illicit drugs.  Patient is still full code.  In the ED Temp 101.6, heart rate 148, respiratory rate 27-35, blood pressure 125/86, saturating at 96% on 13 L high flow nasal cannula No leukocytosis with a white blood cell count of 8.5, hemoglobin 13.5 Chemistry panel is unremarkable aside from a hyperglycemia at 226 Chest x-ray shows a patchy lower lobe predominant opacities have increased from prior exam possible organizing pneumonia post Covid EKG shows a heart rate of 150, sinus tach, QTC 493 Lactic acid was elevated at 2.9 Patient was given Tylenol and then ibuprofen in an effort to bring her fever down, she was started on Rocephin and Zithromax She is visibly anxious so she was given 1 mg Ativan Blood cultures urine cultures pending She did have a transaminitis as well with an AST of 65 and ALT of 122 Admission requested for further management of acute  hypoxic respiratory failure  Hospital Course      Acute on chronic hypoxemic respiratory failure  -patient with baseline respiratory failure with underlining organizing pneumonia, and recent COVID-19 infection , fortunate stick infection felt unlikely. -Pulmonary input greatly appreciated, treated with IV Solu-Medrol, currently transitioned to p.o. prednisone taper, continue with Bactrim prophylaxis as well.  Prescription for both her prednisone taper and Bactrim as well. -Continue with Imuran 50 mg oral daily  -Oxygen requirement back at baseline at 2 L  nasal cannula. - fungitell and Aspergillus ag negative.  -Patient with history of recurrent Covid infection, and vaccine, but her Covid IgG is nonreactive, this is related to her immunosuppression with steroids and Rituxan, unclear what is driving factor of her worsening respiratory failure possibly related to COVID-19 infection, versus chronic Rituxan therapy, but it appears to be improving with steroids, so at this point pulmonary opportunistic infection is unlikely, so antibiotic has been discontinued.  No antibiotics will be described on discharge, and she will be discharged on prednisone taper. -Patient Covid IgG is nonreactive, despite having recurrent COVID-19 positivity and vaccination, this is related to her immunosuppression with Rituxan and steroids, patient received monoclonal antibody.  Severe sepsis secondary to pneumonia-resolving -sepsis present on admission with lactic acid 2.9 tachycardic in the 150s, tachypneic with respiratory rate at 27, febrile 101.6. -Resolved  Diabetes mellitus, new diagnosis with hyperglycemia, Likely induced by steroids -A1c is 8.1, she is with hyperglycemia, required Lantus during hospital stay initially, but once IV steroid has transitioned to p.o. prednisone, and she was put on Glucerna instead of Ensure, and she was transitioned to carb modified diet, her CBG has improved, so she will require no  insulin on discharge, she will be discharged only on Metformin.  .  Sinus tachycardia -Due to above, as well due to severe deconditioning -venous Dopplers negative for DVT  Mild transaminitis -stable  Anemia of chronic illness  Weight loss and failure to thrive  History of neuromyelitis optica -Takes rituximab at home, usually takes it every 43-month, next dose is in April.  Deconditioning -With significant generalized weakness, and severe deconditioning, this is related to her prolonged steroid use, as well due to hypoxia and dyspnea, discussed with PT, will encourage activity, even if she needs increased oxygen requirement with activity.  COVID 19+ -She was diagnosed recently/08/2020 (third episode first was in November 2020, second was in August 2021, third was in February of this year), she is>21 days of her recent infection, no indication for isolation, will discontinue her isolation -Patient Covid IgG is nonreactive, despite having recurrent COVID-19 positivity and vaccination, this is related to her immunosuppression with Rituxan and steroids, patient received monoclonal antibody.   Sacral pressure ulcers, stage II Pressure Injury 05/10/20 Sacrum Medial Stage 2 -  Partial thickness loss of dermis presenting as a shallow open injury with a red, pink wound bed without slough. (Active)  05/10/20 0057  Location: Sacrum  Location Orientation: Medial  Staging: Stage 2 -  Partial thickness loss of dermis presenting as a shallow open injury with a red, pink wound bed without slough.  Wound Description (Comments):   Present on Admission: Yes   She will be given some foam dressing on discharge to continue using it.    Discharge Condition:  stable   Discharge Instructions  and  Discharge Medications    Discharge Instructions    Discharge instructions   Complete by: As directed    Follow with Primary MD Sharilyn Sites, MD in 7 days   Get CBC, CMP, 2 view Chest X ray  checked  by Primary MD next visit.    Activity: As tolerated with Full fall precautions use walker/cane & assistance as needed   Disposition Home    Diet: Heart Healthy /carb modified with feeding assistance and aspiration precautions.  For Heart failure patients - Check your Weight same time everyday, if you gain over 2 pounds, or you develop in leg swelling, experience more shortness of breath or chest pain, call  your Primary MD immediately. Follow Cardiac Low Salt Diet and 1.5 lit/day fluid restriction.   On your next visit with your primary care physician please Get Medicines reviewed and adjusted.   Please request your Prim.MD to go over all Hospital Tests and Procedure/Radiological results at the follow up, please get all Hospital records sent to your Prim MD by signing hospital release before you go home.   If you experience worsening of your admission symptoms, develop shortness of breath, life threatening emergency, suicidal or homicidal thoughts you must seek medical attention immediately by calling 911 or calling your MD immediately  if symptoms less severe.  You Must read complete instructions/literature along with all the possible adverse reactions/side effects for all the Medicines you take and that have been prescribed to you. Take any new Medicines after you have completely understood and accpet all the possible adverse reactions/side effects.   Do not drive, operating heavy machinery, perform activities at heights, swimming or participation in water activities or provide baby sitting services if your were admitted for syncope or siezures until you have seen by Primary MD or a Neurologist and advised to do so again.  Do not drive when taking Pain medications.    Do not take more than prescribed Pain, Sleep and Anxiety Medications  Special Instructions: If you have smoked or chewed Tobacco  in the last 2 yrs please stop smoking, stop any regular Alcohol  and or any  Recreational drug use.  Wear Seat belts while driving.   Please note  You were cared for by a hospitalist during your hospital stay. If you have any questions about your discharge medications or the care you received while you were in the hospital after you are discharged, you can call the unit and asked to speak with the hospitalist on call if the hospitalist that took care of you is not available. Once you are discharged, your primary care physician will handle any further medical issues. Please note that NO REFILLS for any discharge medications will be authorized once you are discharged, as it is imperative that you return to your primary care physician (or establish a relationship with a primary care physician if you do not have one) for your aftercare needs so that they can reassess your need for medications and monitor your lab values   Discharge wound care:   Complete by: As directed    Please apply foam dressing to sacral pressure ulcer twice weekly.   Increase activity slowly   Complete by: As directed      Allergies as of 05/15/2020      Reactions   Hydrocodone Other (See Comments)   Did not help with pain and kept her up all night      Medication List    STOP taking these medications   levofloxacin 750 MG tablet Commonly known as: LEVAQUIN     TAKE these medications   acetaminophen 500 MG tablet Commonly known as: TYLENOL Take 1,000 mg by mouth every 6 (six) hours as needed for fever or mild pain.   albuterol 108 (90 Base) MCG/ACT inhaler Commonly known as: VENTOLIN HFA Inhale 2 puffs into the lungs every 4 (four) hours as needed for wheezing or shortness of breath.   ALPRAZolam 0.5 MG tablet Commonly known as: XANAX Take 1 tablet (0.5 mg total) by mouth 2 (two) times daily as needed for anxiety.   ascorbic acid 500 MG tablet Commonly known as: VITAMIN C Take 1 tablet (500 mg total) by  mouth daily.   aspirin 81 MG tablet Take 1 tablet (81 mg total) by mouth  daily.   azaTHIOprine 50 MG tablet Commonly known as: IMURAN Take 1 tablet (50 mg total) by mouth daily.   benzonatate 200 MG capsule Commonly known as: TESSALON Take 1 capsule (200 mg total) by mouth in the morning, at noon, and at bedtime.   Breo Ellipta 200-25 MCG/INH Aepb Generic drug: fluticasone furoate-vilanterol Inhale 1 puff into the lungs daily.   Cholecalciferol 25 MCG (1000 UT) tablet Take 5,000 Units by mouth daily.   feeding supplement (GLUCERNA SHAKE) Liqd Take 237 mLs by mouth 3 (three) times daily between meals.   gabapentin 300 MG capsule Commonly known as: NEURONTIN Take 300 mg by mouth at bedtime.   guaiFENesin 600 MG 12 hr tablet Commonly known as: MUCINEX Take 600 mg by mouth 2 (two) times daily as needed for cough or to loosen phlegm.   ipratropium-albuterol 0.5-2.5 (3) MG/3ML Soln Commonly known as: DUONEB Take 3 mLs by nebulization every 6 (six) hours as needed.   metFORMIN 1000 MG tablet Commonly known as: GLUCOPHAGE Take 1 tablet (1,000 mg total) by mouth 2 (two) times daily with a meal.   multivitamin with minerals Tabs tablet Take 1 tablet by mouth daily. Start taking on: May 16, 2020   pantoprazole 40 MG tablet Commonly known as: PROTONIX Take 1 tablet (40 mg total) by mouth daily. Start taking on: May 16, 2020   predniSONE 10 MG tablet Commonly known as: DELTASONE Take 60 mg daily for 7 days,Take 50mg  daily for 7 days,Take 40mg  daily till seen in pulmonary clinic.   sulfamethoxazole-trimethoprim 800-160 MG tablet Commonly known as: BACTRIM DS Take 1 tablet by mouth 3 (three) times a week. Mon, Wed, Fri Start taking on: May 16, 2020   zinc sulfate 220 (50 Zn) MG capsule Take 1 capsule (220 mg total) by mouth daily. What changed: how much to take            Discharge Care Instructions  (From admission, onward)         Start     Ordered   05/15/20 0000  Discharge wound care:       Comments: Please apply foam  dressing to sacral pressure ulcer twice weekly.   05/15/20 1103            Diet and Activity recommendation: See Discharge Instructions above   Consults obtained -  PCCM   Major procedures and Radiology Reports - PLEASE review detailed and final reports for all details, in brief -     US Venous Img Lower Bilateral (DVT)  Result Date: 05/09/2020 CLINICAL DATA:  Right leg pain, positive D-dimer EXAM: BILATERAL LOWER EXTREMITY VENOUS DOPPLER ULTRASOUND TECHNIQUE: Gray-scale sonography with graded compression, as well as color Doppler and duplex ultrasound were performed to evaluate the lower extremity deep venous systems from the level of the common femoral vein and including the common femoral, femoral, profunda femoral, popliteal and calf veins including the posterior tibial, peroneal and gastrocnemius veins when visible. The superficial great saphenous vein was also interrogated. Spectral Doppler was utilized to evaluate flow at rest and with distal augmentation maneuvers in the common femoral, femoral and popliteal veins. COMPARISON:  None. FINDINGS: RIGHT LOWER EXTREMITY Common Femoral Vein: No evidence of thrombus. Normal compressibility, respiratory phasicity and response to augmentation. Saphenofemoral Junction: No evidence of thrombus. Normal compressibility and flow on color Doppler imaging. Profunda Femoral Vein: No evidence of thrombus. Normal compressibility and  flow on color Doppler imaging. Femoral Vein: No evidence of thrombus. Normal compressibility, respiratory phasicity and response to augmentation. Popliteal Vein: No evidence of thrombus. Normal compressibility, respiratory phasicity and response to augmentation. Calf Veins: No evidence of thrombus. Normal compressibility and flow on color Doppler imaging. LEFT LOWER EXTREMITY Common Femoral Vein: No evidence of thrombus. Normal compressibility, respiratory phasicity and response to augmentation. Saphenofemoral Junction: No  evidence of thrombus. Normal compressibility and flow on color Doppler imaging. Profunda Femoral Vein: No evidence of thrombus. Normal compressibility and flow on color Doppler imaging. Femoral Vein: No evidence of thrombus. Normal compressibility, respiratory phasicity and response to augmentation. Popliteal Vein: No evidence of thrombus. Normal compressibility, respiratory phasicity and response to augmentation. Calf Veins: No evidence of thrombus. Normal compressibility and flow on color Doppler imaging. IMPRESSION: No evidence of deep venous thrombosis in either lower extremity. Electronically Signed   By: Jerilynn Mages.  Shick M.D.   On: 05/09/2020 14:06   DG Chest Port 1 View  Result Date: 05/08/2020 CLINICAL DATA:  Shortness of breath. EXAM: PORTABLE CHEST 1 VIEW COMPARISON:  Radiograph and CT 04/08/2020 FINDINGS: Lung volumes are low. Upper normal heart size. Unchanged mediastinal contours. Patchy lower lobe predominant opacities that have increased from prior exam. No pneumothorax or pleural effusion. No pulmonary edema. No acute osseous abnormalities are seen. IMPRESSION: Patchy lower lobe predominant opacities have increased from prior exam. This may represent organizing pneumonia post COVID. Electronically Signed   By: Keith Rake M.D.   On: 05/08/2020 23:55    Micro Results     Recent Results (from the past 240 hour(s))  Resp Panel by RT-PCR (Flu A&B, Covid) Nasopharyngeal Swab     Status: Abnormal   Collection Time: 05/08/20 11:34 PM   Specimen: Nasopharyngeal Swab; Nasopharyngeal(NP) swabs in vial transport medium  Result Value Ref Range Status   SARS Coronavirus 2 by RT PCR POSITIVE (A) NEGATIVE Final    Comment: RESULT CALLED TO, READ BACK BY AND VERIFIED WITH: H SPENCE,RN@0055  05/09/20 MKELLY (NOTE) SARS-CoV-2 target nucleic acids are DETECTED.  The SARS-CoV-2 RNA is generally detectable in upper respiratory specimens during the acute phase of infection. Positive results  are indicative of the presence of the identified virus, but do not rule out bacterial infection or co-infection with other pathogens not detected by the test. Clinical correlation with patient history and other diagnostic information is necessary to determine patient infection status. The expected result is Negative.  Fact Sheet for Patients: EntrepreneurPulse.com.au  Fact Sheet for Healthcare Providers: IncredibleEmployment.be  This test is not yet approved or cleared by the [place] FDA and  has been authorized for detection and/or diagnosis of SARS-CoV-2 by FDA under an Emergency Use Authorization (EUA).  This EUA will remain in effect (meaning this test can be  used) for the duration of  the COVID-19 declaration under Section 564(b)(1) of the Act, 21 U.S.C. section 360bbb-3(b)(1), unless the authorization is terminated or revoked sooner.     Influenza A by PCR NEGATIVE NEGATIVE Final   Influenza B by PCR NEGATIVE NEGATIVE Final    Comment: (NOTE) The Xpert Xpress SARS-CoV-2/FLU/RSV plus assay is intended as an aid in the diagnosis of influenza from Nasopharyngeal swab specimens and should not be used as a sole basis for treatment. Nasal washings and aspirates are unacceptable for Xpert Xpress SARS-CoV-2/FLU/RSV testing.  Fact Sheet for Patients: EntrepreneurPulse.com.au  Fact Sheet for Healthcare Providers: IncredibleEmployment.be  This test is not yet approved or cleared by the [place] FDA and has  been authorized for detection and/or diagnosis of SARS-CoV-2 by FDA under an Emergency Use Authorization (EUA). This EUA will remain in effect (meaning this test can be used) for the duration of the COVID-19 declaration under Section 564(b)(1) of the Act, 21 U.S.C. section 360bbb-3(b)(1), unless the authorization is terminated or revoked.  Performed at [facility], [address]  [place], [place] [address]   Blood Culture (routine x 2)     Status: None   Collection Time: 05/09/20 12:44 AM   Specimen: BLOOD RIGHT HAND  Result Value Ref Range Status   Specimen Description BLOOD RIGHT HAND  Final   Special Requests   Final    BOTTLES DRAWN AEROBIC AND ANAEROBIC Blood Culture results may not be optimal due to an excessive volume of blood received in culture bottles   Culture   Final    NO GROWTH 5 DAYS Performed at [facility], [address], [place], [place] [address]    Report Status 05/14/2020 FINAL  Final  MRSA PCR Screening     Status: None   Collection Time: 05/09/20  3:16 AM   Specimen: Nasal Mucosa; Nasopharyngeal  Result Value Ref Range Status   MRSA by PCR NEGATIVE NEGATIVE Final    Comment:        The GeneXpert MRSA Assay (FDA approved for NASAL specimens only), is one component of a comprehensive MRSA colonization surveillance program. It is not intended to diagnose MRSA infection nor to guide or monitor treatment for MRSA infections. Performed at [facility], [address], [place], [place] [address]   Urine culture     Status: None   Collection Time: 05/10/20 12:38 AM   Specimen: In/Out Cath Urine  Result Value Ref Range Status   Specimen Description IN/OUT CATH URINE  Final   Special Requests NONE  Final   Culture   Final    NO GROWTH Performed at [facility], [place] [address], [place], [place] [address]    Report Status 05/11/2020 FINAL  Final  Blood Culture (routine x 2)     Status: None (Preliminary result)   Collection Time: 05/10/20  3:11 AM   Specimen: BLOOD RIGHT FOREARM  Result Value Ref Range Status   Specimen Description BLOOD RIGHT FOREARM  Final   Special Requests AEROBIC BOTTLE ONLY Blood Culture adequate volume  Final   Culture   Final    NO GROWTH 4 DAYS Performed at [facility], [place] [address] [place],  [address]    Report Status PENDING  Incomplete  Respiratory (~20 pathogens) panel by PCR      Status: None   Collection Time: 05/10/20  7:59 AM   Specimen: Nasopharyngeal Swab; Respiratory  Result Value Ref Range Status   Adenovirus NOT DETECTED NOT DETECTED Final   Coronavirus 229E NOT DETECTED NOT DETECTED Final    Comment: (NOTE) The Coronavirus on the Respiratory Panel, DOES NOT test for the novel  Coronavirus (2019 nCoV)    Coronavirus HKU1 NOT DETECTED NOT DETECTED Final   Coronavirus NL63 NOT DETECTED NOT DETECTED Final   Coronavirus OC43 NOT DETECTED NOT DETECTED Final   Metapneumovirus NOT DETECTED NOT DETECTED Final   Rhinovirus / Enterovirus NOT DETECTED NOT DETECTED Final   Influenza A NOT DETECTED NOT DETECTED Final   Influenza B NOT DETECTED NOT DETECTED Final   Parainfluenza Virus 1 NOT DETECTED NOT DETECTED Final   Parainfluenza Virus 2 NOT DETECTED NOT DETECTED Final   Parainfluenza Virus 3 NOT DETECTED  NOT DETECTED Final   Parainfluenza Virus 4 NOT DETECTED NOT DETECTED Final   Respiratory Syncytial Virus NOT DETECTED NOT DETECTED Final   Bordetella pertussis NOT DETECTED NOT DETECTED Final   Bordetella Parapertussis NOT DETECTED NOT DETECTED Final   Chlamydophila pneumoniae NOT DETECTED NOT DETECTED Final   Mycoplasma pneumoniae NOT DETECTED NOT DETECTED Final    Comment: Performed at [facility], [place] [address], [place], [place] [address]  Urine Culture     Status: None   Collection Time: 05/10/20  2:33 PM   Specimen: Urine, Random  Result Value Ref Range Status   Specimen Description URINE, RANDOM  Final   Special Requests NONE  Final   Culture   Final    NO GROWTH Performed at [facility], [place] [address] [place], [place] [address]    Report Status 05/12/2020 FINAL  Final       Today   Subjective:   Grace Moyer today has no headache,no chest or abdominal pain, reports her dyspnea has improved, weakness has improved as well.  .  Objective:   Blood pressure 93/71, pulse (!) 122, temperature 98.6 F (37 C), temperature  source Oral, resp. rate (!) 24, height 5\' 3"  (1.6 m), weight 72.5 kg, SpO2 94 %.   Intake/Output Summary (Last 24 hours) at 05/15/2020 1105 Last data filed at 05/15/2020 0500 Gross per 24 hour  Intake 360 ml  Output 950 ml  Net -590 ml    Exam Awake Alert, Oriented x 3, No new F.N deficits, Normal affect Symmetrical Chest wall movement, Good air movement bilaterally, scattered rales RRR,No Gallops,Rubs or new Murmurs, No Parasternal Heave +ve B.Sounds, Abd Soft, Non tender, No organomegaly appriciated, No rebound -guarding or rigidity. No Cyanosis, Clubbing or edema, No new Rash or bruise  Data Review   CBC w Diff:  Lab Results  Component Value Date   WBC 7.0 05/12/2020   HGB 9.8 (L) 05/12/2020   HGB 12.2 12/31/2016   HCT 30.6 (L) 05/12/2020   HCT 36.0 12/31/2016   PLT 155 05/12/2020   PLT 165 12/31/2016   LYMPHOPCT 3 05/08/2020   LYMPHOPCT 17.1 12/31/2016   MONOPCT 2 05/08/2020   MONOPCT 8.3 12/31/2016   EOSPCT 0 05/08/2020   EOSPCT 3.1 12/31/2016   BASOPCT 0 05/08/2020   BASOPCT 0.5 12/31/2016    CMP:  Lab Results  Component Value Date   NA 137 05/12/2020   NA 140 12/31/2016   K 4.1 05/12/2020   K 4.7 12/31/2016   CL 102 05/12/2020   CO2 28 05/12/2020   CO2 25 12/31/2016   BUN 14 05/12/2020   BUN 11.0 12/31/2016   CREATININE 0.54 05/12/2020   CREATININE 0.9 12/31/2016   PROT 5.3 (L) 05/09/2020   PROT 6.8 12/31/2016   ALBUMIN 2.6 (L) 05/09/2020   ALBUMIN 3.9 12/31/2016   BILITOT 0.6 05/09/2020   BILITOT 0.58 12/31/2016   ALKPHOS 148 (H) 05/09/2020   ALKPHOS 124 12/31/2016   AST 39 05/09/2020   AST 32 12/31/2016   ALT 97 (H) 05/09/2020   ALT 42 12/31/2016  .   Total Time in preparing paper work, data evaluation and todays exam - 22 minutes  Phillips Climes M.D on 05/15/2020 at 11:05 AM  [facility]   Office  [phone]

## 2020-05-16 LAB — THIOPURINE METHYLTRANSFERASE (TPMT), RBC: Thiopurine Methyltransferase, RBC: 15 nmol/hr/mL RBC

## 2020-05-21 ENCOUNTER — Ambulatory Visit (INDEPENDENT_AMBULATORY_CARE_PROVIDER_SITE_OTHER): Payer: BC Managed Care – PPO | Admitting: Primary Care

## 2020-05-21 ENCOUNTER — Other Ambulatory Visit: Payer: Self-pay

## 2020-05-21 ENCOUNTER — Encounter: Payer: Self-pay | Admitting: Primary Care

## 2020-05-21 ENCOUNTER — Ambulatory Visit (INDEPENDENT_AMBULATORY_CARE_PROVIDER_SITE_OTHER): Payer: BC Managed Care – PPO

## 2020-05-21 VITALS — BP 118/64 | HR 124 | Temp 97.5°F | Ht 63.0 in | Wt 145.4 lb

## 2020-05-21 DIAGNOSIS — J84116 Cryptogenic organizing pneumonia: Secondary | ICD-10-CM

## 2020-05-21 DIAGNOSIS — J1282 Pneumonia due to coronavirus disease 2019: Secondary | ICD-10-CM

## 2020-05-21 DIAGNOSIS — U071 COVID-19: Secondary | ICD-10-CM

## 2020-05-21 DIAGNOSIS — M62569 Muscle wasting and atrophy, not elsewhere classified, unspecified lower leg: Secondary | ICD-10-CM

## 2020-05-21 DIAGNOSIS — J9611 Chronic respiratory failure with hypoxia: Secondary | ICD-10-CM | POA: Diagnosis not present

## 2020-05-21 DIAGNOSIS — M6250 Muscle wasting and atrophy, not elsewhere classified, unspecified site: Secondary | ICD-10-CM | POA: Insufficient documentation

## 2020-05-21 DIAGNOSIS — J189 Pneumonia, unspecified organism: Secondary | ICD-10-CM | POA: Diagnosis not present

## 2020-05-21 NOTE — Assessment & Plan Note (Signed)
-   Due to immobilization from dyspnea  - Referred for home PT

## 2020-05-21 NOTE — Assessment & Plan Note (Addendum)
-   Steroid responsive ILD/COP exacerbated by recent covid exposure. Unable to bronch d/t frail respiratory status. Low suspicion for infectious cause - Currently on Imuran 50mg  daily, high dose prednisone taper (60mg  x 7 days, 50mg  x 7 days, 40mg  daily until next follow-up), Bactrim DS prophylactically while on prednisone  - FU April 19th with Dr. Vaughan Browner

## 2020-05-21 NOTE — Progress Notes (Signed)
@Patient  ID: Grace Moyer, female    DOB: 12-Jul-1960, 60 y.o.   MRN: [id number]  Chief Complaint  Patient presents with  . Hospitalization Follow-up    Pt states she has been doing okay since she has been out of the hospital. Pt states that O2 sats have been maintaining stable on 1L O2 but if needed, will bump up to 2L. States she has an occ cough and has SOB when exerts herself.    Referring provider: Sharilyn Sites, MD  HPI: 60 year old female, former smoker quit in 2003.  Past medical history significant for history of COVID-19, chronic respiratory failure with hypoxia, cryptogenic organizing pneumonia, pulmonary embolism, malignant neoplasm left breast with history of radiation/chemotherapy and lumpectomy, prediabetes, neuromyelitis optica, protein calorie malnutrition.  Patient of Dr. Vaughan Browner, seen in office on 05/07/2020.  Started on azathioprine 2 mg daily.  May order placed to increase oxygen concentrator to 10 L.  She was admitted on 05/08/2020 for acute on chronic hypoxic respiratory failure, severe sepsis secondary to pneumonia.  Hospital course 05/08/2020-05/15/2020 Patient was admitted with chief complaint of dyspnea.  She was first diagnosed with Covid in November 2020 and has declined in pulmonary health since.  Diagnosed recently on 04/08/20, this is her third episode of Covid.  Oxygen saturations were 85% on 10 L.  Heart rate 150.  Patient had an absent cough but felt feverish.  Patient had a history of DVT/PE in 2018 treated with Xarelto for 6 months.  Patient had CTA in February which was negative.  In ED chest x-ray showed patchy lower lobe predominant opacities which have increased from prior exam possible organizing pneumonia post COVID.  She was started on Rocephin and Zithromax.  Admitted for acute on chronic hypoxic respiratory failure.  Treated with IV Solu-Medrol which was transitioned to prednisone taper.  She is on Bactrim prophylactically.  Continues Imuran 50 mg daily.   Oxygen requirements back to baseline at 2 L nasal cannula.  Aspergillus Ag negative.  Covid IgG is nonreactive related to immunosuppression with steroids and Rituxan.  Patient received monoclonal antibody this admission.  Pulmonary felt opportunistic infection unlikely, antibiotics were discontinued.  05/21/2020-interim history Presents today for hospital/2 to 4-week follow-up. Accompanied by her daughter. She is feeling better since hospital admission. Her breathing has been ok, she is currently on 1L supplemental oxygen. Her O2 levels have been maintaining between 93-96% 1L at rest. She will briefly desaturate to 83-84% on 1L with exertion but she recovers quickly with rest. Her main complaint is muscle atrophy. She was referred for PT/OT but has not started because she need new order for home therapy.  If would be extremely difficulty for her to leave her house twice a week d/t needing WC for transportation and immunosuppression. She is currently maintained on Imuran, Rituxin, high dose prednisone. She is on Bactrim DS prophylactically while on oral steriods. She is coughing at little more, cough is non-productive. She recently started taking mucinex. She needs repeat CXR today. She has a follow-up apt with Dr. Vaughan Browner on April 19th    Diane hitchhock [phone] [employer] Case management nurse   Allergies  Allergen Reactions  . Hydrocodone Other (See Comments)    Did not help with pain and kept her up all night    Immunization History  Administered Date(s) Administered  . Influenza Inj Mdck Quad Pf 12/13/2018, 11/02/2019  . Influenza,inj,Quad PF,6+ Mos 11/29/2015, 11/20/2017  . PFIZER(Purple Top)SARS-COV-2 Vaccination 07/22/2019, 08/19/2019    Past Medical History:  Diagnosis  Date  . Anxiety   . Breast cancer ([place])   . Breast cancer ([place])   . Family history of breast cancer   . Family history of colon cancer   . Malignant neoplasm of upper-outer quadrant of left female breast ([place])  01/31/2016  . Neuromyelitis optica ([place])   . Personal history of chemotherapy   . Personal history of radiation therapy     Tobacco History: Social History   Tobacco Use  Smoking Status Former Smoker  . Packs/day: 0.10  . Years: 21.00  . Pack years: 2.10  . Types: Cigarettes  . Start date: 69  . Quit date: 09/03/2001  . Years since quitting: 18.7  Smokeless Tobacco Never Used   Counseling given: Not Answered   Outpatient Medications Prior to Visit  Medication Sig Dispense Refill  . acetaminophen (TYLENOL) 500 MG tablet Take 1,000 mg by mouth every 6 (six) hours as needed for fever or mild pain.    Marland Kitchen albuterol (VENTOLIN HFA) 108 (90 Base) MCG/ACT inhaler Inhale 2 puffs into the lungs every 4 (four) hours as needed for wheezing or shortness of breath. 6.7 g 3  . ALPRAZolam (XANAX) 0.5 MG tablet Take 1 tablet (0.5 mg total) by mouth 2 (two) times daily as needed for anxiety. 12 tablet 0  . aspirin 81 MG tablet Take 1 tablet (81 mg total) by mouth daily. 30 tablet   . benzonatate (TESSALON) 200 MG capsule Take 1 capsule (200 mg total) by mouth in the morning, at noon, and at bedtime. 20 capsule 2  . Cholecalciferol 25 MCG (1000 UT) tablet Take 5,000 Units by mouth daily.     . feeding supplement, GLUCERNA SHAKE, (GLUCERNA SHAKE) LIQD Take 237 mLs by mouth 3 (three) times daily between meals.  0  . fluticasone furoate-vilanterol (BREO ELLIPTA) 200-25 MCG/INH AEPB Inhale 1 puff into the lungs daily. 60 each 5  . gabapentin (NEURONTIN) 300 MG capsule Take 300 mg by mouth at bedtime.    Marland Kitchen guaiFENesin (MUCINEX) 600 MG 12 hr tablet Take 600 mg by mouth 2 (two) times daily as needed for cough or to loosen phlegm.    Marland Kitchen ipratropium-albuterol (DUONEB) 0.5-2.5 (3) MG/3ML SOLN Take 3 mLs by nebulization every 6 (six) hours as needed. 360 mL 3  . metFORMIN (GLUCOPHAGE) 1000 MG tablet Take 1 tablet (1,000 mg total) by mouth 2 (two) times daily with a meal. 60 tablet 0  . Multiple Vitamin  (MULTIVITAMIN WITH MINERALS) TABS tablet Take 1 tablet by mouth daily. 30 tablet 0  . pantoprazole (PROTONIX) 40 MG tablet Take 1 tablet (40 mg total) by mouth daily. 30 tablet 0  . sulfamethoxazole-trimethoprim (BACTRIM DS) 800-160 MG tablet Take 1 tablet by mouth 3 (three) times a week. Mon, Wed, Fri 15 tablet 0  . vitamin C (VITAMIN C) 500 MG tablet Take 1 tablet (500 mg total) by mouth daily. 30 tablet 0  . zinc sulfate 220 (50 Zn) MG capsule Take 1 capsule (220 mg total) by mouth daily. (Patient taking differently: Take 50 mg by mouth daily.) 30 capsule 0  . azaTHIOprine (IMURAN) 50 MG tablet Take 1 tablet (50 mg total) by mouth daily. 30 tablet 1  . predniSONE (DELTASONE) 10 MG tablet Take 60 mg daily for 7 days,Take 50mg  daily for 7 days,Take 40mg  daily till seen in pulmonary clinic. 150 tablet 0   Facility-Administered Medications Prior to Visit  Medication Dose Route Frequency Provider Last Rate Last Admin  . heparin lock flush 100 unit/mL  500 Units Intravenous Once PRN Truitt Merle, MD      . sodium chloride flush (NS) 0.9 % injection 10 mL  10 mL Intracatheter PRN Truitt Merle, MD   10 mL at 06/18/16 1540  . sodium chloride flush (NS) 0.9 % injection 10 mL  10 mL Intravenous PRN Truitt Merle, MD      . sodium chloride flush (NS) 0.9 % injection 10 mL  10 mL Intravenous PRN Truitt Merle, MD   10 mL at 06/25/16 1106    Review of Systems  Review of Systems  Constitutional: Negative.   Respiratory: Positive for cough. Negative for chest tightness, shortness of breath and wheezing.   Cardiovascular: Negative.  Negative for chest pain and leg swelling.  Neurological: Positive for weakness.  Psychiatric/Behavioral: Negative.     Physical Exam  BP 118/64 (BP Location: Right Arm, Cuff Size: Normal)   Pulse (!) 124   Temp (!) 97.5 F (36.4 C) (Temporal)   Ht 5\' 3"  (1.6 m)   Wt 145 lb 6.4 oz (66 kg)   SpO2 95% Comment: 1L  BMI 25.76 kg/m  Physical Exam Constitutional:      Appearance:  Normal appearance.  HENT:     Head: Normocephalic and atraumatic.     Mouth/Throat:     Comments: Deferred d/t masking Cardiovascular:     Rate and Rhythm: Regular rhythm. Tachycardia present.     Comments: Regular rhythm, tachycardic  Pulmonary:     Effort: Pulmonary effort is normal.     Breath sounds: Rales present. No wheezing or rhonchi.     Comments: Rales bilateral bases  Musculoskeletal:        General: No swelling.     Comments: In transport WC; muscle atrophy BLE's  Skin:    General: Skin is warm and dry.  Neurological:     General: No focal deficit present.     Mental Status: She is alert and oriented to person, place, and time. Mental status is at baseline.  Psychiatric:        Mood and Affect: Mood normal.        Behavior: Behavior normal.        Thought Content: Thought content normal.        Judgment: Judgment normal.      Lab Results:  CBC    Component Value Date/Time   WBC 7.0 05/12/2020 0351   RBC 3.46 (L) 05/12/2020 0351   HGB 9.8 (L) 05/12/2020 0351   HGB 12.2 12/31/2016 1121   HCT 30.6 (L) 05/12/2020 0351   HCT 36.0 12/31/2016 1121   PLT 155 05/12/2020 0351   PLT 165 12/31/2016 1121   MCV 88.4 05/12/2020 0351   MCV 88.5 12/31/2016 1121   MCH 28.3 05/12/2020 0351   MCHC 32.0 05/12/2020 0351   RDW 16.4 (H) 05/12/2020 0351   RDW 13.5 12/31/2016 1121   LYMPHSABS 0.2 (L) 05/08/2020 2334   LYMPHSABS 0.7 (L) 12/31/2016 1121   MONOABS 0.1 05/08/2020 2334   MONOABS 0.3 12/31/2016 1121   EOSABS 0.0 05/08/2020 2334   EOSABS 0.1 12/31/2016 1121   BASOSABS 0.0 05/08/2020 2334   BASOSABS 0.0 12/31/2016 1121    BMET    Component Value Date/Time   NA 137 05/12/2020 0351   NA 140 12/31/2016 1121   K 4.1 05/12/2020 0351   K 4.7 12/31/2016 1121   CL 102 05/12/2020 0351   CO2 28 05/12/2020 0351   CO2 25 12/31/2016 1121   GLUCOSE 209 (H)  05/12/2020 0351   GLUCOSE 103 12/31/2016 1121   BUN 14 05/12/2020 0351   BUN 11.0 12/31/2016 1121   CREATININE  0.54 05/12/2020 0351   CREATININE 0.9 12/31/2016 1121   CALCIUM 8.8 (L) 05/12/2020 0351   CALCIUM 9.5 12/31/2016 1121   GFRNONAA >60 05/12/2020 0351   GFRAA >60 10/07/2019 1403    BNP    Component Value Date/Time   BNP 67.0 05/09/2020 0044    ProBNP No results found for: PROBNP  Imaging: DG Chest 2 View  Result Date: 05/22/2020 CLINICAL DATA:  60 year old female with history of COVID pneumonia. Follow-up study. EXAM: CHEST - 2 VIEW COMPARISON:  Chest x-ray 05/08/2020. FINDINGS: Lung volumes are low. Areas of interstitial prominence and architectural distortion are noted throughout the mid to lower lungs bilaterally (right greater than left). No acute consolidative airspace disease. No pleural effusions. No evidence of pulmonary edema. No pneumothorax. Heart size is normal. Upper mediastinal contours are within normal limits. IMPRESSION: 1. The appearance of the chest suggests resolving pneumonia with probable post infectious fibrosis. These findings could be better evaluated with follow-up nonemergent high-resolution chest CT if clinically appropriate. Electronically Signed   By: Vinnie Langton M.D.   On: 05/22/2020 13:45   US Venous Img Lower Bilateral (DVT)  Result Date: 05/09/2020 CLINICAL DATA:  Right leg pain, positive D-dimer EXAM: BILATERAL LOWER EXTREMITY VENOUS DOPPLER ULTRASOUND TECHNIQUE: Gray-scale sonography with graded compression, as well as color Doppler and duplex ultrasound were performed to evaluate the lower extremity deep venous systems from the level of the common femoral vein and including the common femoral, femoral, profunda femoral, popliteal and calf veins including the posterior tibial, peroneal and gastrocnemius veins when visible. The superficial great saphenous vein was also interrogated. Spectral Doppler was utilized to evaluate flow at rest and with distal augmentation maneuvers in the common femoral, femoral and popliteal veins. COMPARISON:  None. FINDINGS:  RIGHT LOWER EXTREMITY Common Femoral Vein: No evidence of thrombus. Normal compressibility, respiratory phasicity and response to augmentation. Saphenofemoral Junction: No evidence of thrombus. Normal compressibility and flow on color Doppler imaging. Profunda Femoral Vein: No evidence of thrombus. Normal compressibility and flow on color Doppler imaging. Femoral Vein: No evidence of thrombus. Normal compressibility, respiratory phasicity and response to augmentation. Popliteal Vein: No evidence of thrombus. Normal compressibility, respiratory phasicity and response to augmentation. Calf Veins: No evidence of thrombus. Normal compressibility and flow on color Doppler imaging. LEFT LOWER EXTREMITY Common Femoral Vein: No evidence of thrombus. Normal compressibility, respiratory phasicity and response to augmentation. Saphenofemoral Junction: No evidence of thrombus. Normal compressibility and flow on color Doppler imaging. Profunda Femoral Vein: No evidence of thrombus. Normal compressibility and flow on color Doppler imaging. Femoral Vein: No evidence of thrombus. Normal compressibility, respiratory phasicity and response to augmentation. Popliteal Vein: No evidence of thrombus. Normal compressibility, respiratory phasicity and response to augmentation. Calf Veins: No evidence of thrombus. Normal compressibility and flow on color Doppler imaging. IMPRESSION: No evidence of deep venous thrombosis in either lower extremity. Electronically Signed   By: Jerilynn Mages.  Shick M.D.   On: 05/09/2020 14:06   DG Chest Port 1 View  Result Date: 05/08/2020 CLINICAL DATA:  Shortness of breath. EXAM: PORTABLE CHEST 1 VIEW COMPARISON:  Radiograph and CT 04/08/2020 FINDINGS: Lung volumes are low. Upper normal heart size. Unchanged mediastinal contours. Patchy lower lobe predominant opacities that have increased from prior exam. No pneumothorax or pleural effusion. No pulmonary edema. No acute osseous abnormalities are seen. IMPRESSION:  Patchy lower lobe predominant  opacities have increased from prior exam. This may represent organizing pneumonia post COVID. Electronically Signed   By: Keith Rake M.D.   On: 05/08/2020 23:55     Assessment & Plan:   Pneumonia due to COVID-19 virus - Patient has had recurrent COVID infections d/t immunosuppression. Last tested positive on 05/08/20. She received monoclonal abx infusion. Needs repeat CXR today   Cryptogenic organizing pneumonia ([place]) - Steroid responsive ILD/COP exacerbated by recent covid exposure. Unable to bronch d/t frail respiratory status. Low suspicion for infectious cause - Currently on Imuran 50mg  daily, high dose prednisone taper (60mg  x 7 days, 50mg  x 7 days, 40mg  daily until next follow-up), Bactrim DS prophylactically while on prednisone  - FU April 19th with Dr. Vaughan Browner  Chronic respiratory failure with hypoxia ([place]) - Weaned to 1-2L supplemental oxygen - O2 maintaining between 93-96% 1L at rest; desaturated mid-80s with ambulation but quickly recovers with rest  - Needs physical therapy to work on conditioning level and endurance   Muscle atrophy - Due to immobilization from dyspnea  - Referred for home PT   Martyn Ehrich, NP 05/24/2020

## 2020-05-21 NOTE — Assessment & Plan Note (Addendum)
-   Patient has had recurrent COVID infections d/t immunosuppression. Last tested positive on 05/08/20. She received monoclonal abx infusion. Needs repeat CXR today

## 2020-05-21 NOTE — Patient Instructions (Signed)
Nice seeing you today Grace Moyer  Recommendations: Start prednisone 50mg  tomorrow x 7 days; then taper 40mg  daily until follow-up with Dr. Vaughan Browner Continue Bactrim DS while on prednisone Continue Imuran (azathioprine) Continue Breo 200 one puff daily Continue 1-2L oxygen 24/7; titrate to maintain O2 >88-90%   Orders: CXR today DME for home PT/OT  Follow-up: April 19th with Dr. Vaughan Browner

## 2020-05-21 NOTE — Assessment & Plan Note (Signed)
-   Weaned to 1-2L supplemental oxygen - O2 maintaining between 93-96% 1L at rest; desaturated mid-80s with ambulation but quickly recovers with rest  - Needs physical therapy to work on conditioning level and endurance

## 2020-05-22 NOTE — Progress Notes (Signed)
   05/15/20 1014  Assess: MEWS Score  Temp 98.4 F (36.9 C)  BP 100/73  Pulse Rate (!) 121  ECG Heart Rate (!) 124  Resp (!) 21  Level of Consciousness Alert  SpO2 96 %  O2 Device Nasal Cannula  O2 Flow Rate (L/min) 1 L/min  Assess: MEWS Score  MEWS Temp 0  MEWS Systolic 1  MEWS Pulse 2  MEWS RR 1  MEWS LOC 0  MEWS Score 4  MEWS Score Color Red  Assess: if the MEWS score is Yellow or Red  Were vital signs taken at a resting state? Yes  Focused Assessment No change from prior assessment  Early Detection of Sepsis Score *See Row Information* Low  MEWS guidelines implemented *See Row Information* Yes  Treat  MEWS Interventions Administered scheduled meds/treatments  Pain Scale 0-10  Pain Score 0  Take Vital Signs  Increase Vital Sign Frequency  Red: Q 1hr X 4 then Q 4hr X 4, if remains red, continue Q 4hrs  Escalate  MEWS: Escalate Red: discuss with charge nurse/RN and provider, consider discussing with RRT  Notify: Charge Nurse/RN  Name of Charge Nurse/RN Notified Erin RN  Date Charge Nurse/RN Notified 05/22/20  Time Charge Nurse/RN Notified 1015  Notify: Provider  Provider Name/Title [place]/Attending  Date Provider Notified 05/15/20  Time Provider Notified 1020  Notification Type Page (SecureChat)  Notification Reason Other (Comment) ([place])  Provider response No new orders;Other (Comment) (Continue to monitor)  Date of Provider Response 05/22/20  Time of Provider Response 1021  Document  Patient Outcome Not stable and remains on department

## 2020-05-22 NOTE — Progress Notes (Signed)
   05/15/20 0818  Assess: MEWS Score  Temp 98.3 F (36.8 C)  BP 99/70  Pulse Rate (!) 116  ECG Heart Rate (!) 114  Resp 20  Level of Consciousness Alert  SpO2 96 %  O2 Device Nasal Cannula  O2 Flow Rate (L/min) 1 L/min  Assess: MEWS Score  MEWS Temp 0  MEWS Systolic 1  MEWS Pulse 2  MEWS RR 0  MEWS LOC 0  MEWS Score 3  MEWS Score Color Yellow  Assess: if the MEWS score is Yellow or Red  Were vital signs taken at a resting state? Yes  Focused Assessment No change from prior assessment  Early Detection of Sepsis Score *See Row Information* Low  MEWS guidelines implemented *See Row Information* Yes  Treat  MEWS Interventions Administered scheduled meds/treatments;Administered prn meds/treatments  Pain Scale 0-10  Pain Score 0  Neuro symptoms relieved by Anti-anxiety medication;Rest;Relaxation techniques (Comment)  Take Vital Signs  Increase Vital Sign Frequency  Yellow: Q 2hr X 2 then Q 4hr X 2, if remains yellow, continue Q 4hrs  Escalate  MEWS: Escalate Yellow: discuss with charge nurse/RN and consider discussing with provider and RRT  Notify: Charge Nurse/RN  Name of Charge Nurse/RN Notified Erin RN  Date Charge Nurse/RN Notified 05/22/20  Time Charge Nurse/RN Notified 0820

## 2020-05-22 NOTE — Progress Notes (Signed)
I discussed CXR findings with patient, can you please order HRCT first available re: covid pneumonia/ILD

## 2020-05-23 ENCOUNTER — Telehealth: Payer: Self-pay | Admitting: Pulmonary Disease

## 2020-05-23 ENCOUNTER — Other Ambulatory Visit: Payer: Self-pay | Admitting: *Deleted

## 2020-05-23 DIAGNOSIS — J849 Interstitial pulmonary disease, unspecified: Secondary | ICD-10-CM

## 2020-05-23 MED ORDER — PREDNISONE 10 MG PO TABS: ORAL_TABLET | ORAL | 0 refills | Status: DC

## 2020-05-23 MED ORDER — AZATHIOPRINE 100 MG PO TABS: 100.0000 mg | ORAL_TABLET | Freq: Every day | ORAL | 5 refills | Status: DC

## 2020-05-23 NOTE — Telephone Encounter (Signed)
New prescriptions for Azathioprine and Prednisone sent to Patient's pharmacy. Home health referral was placed 05/21/20.  Referral came back [facility] stating they do not have staff at this time to cover [place] area.   PCC's - are there any other home health companies in or that can go to [place] area?  Patient can not go to facility for rehab at this time.

## 2020-05-23 NOTE — Telephone Encounter (Signed)
I called and discussed with patient  She is doing better, weaning down on prednisone I have instructed her to go to azathioprine 100 mg/day  She will need a new prescription for azathioprine and a renewal of prednisone as she is going to run out of it. Also follow-up on the order placed on 3/21 for home physical therapy as she is unable to go to the facility for PT

## 2020-05-24 NOTE — Telephone Encounter (Signed)
Will forward to Dr. Vaughan Browner to inform him there are no current agencies that are able to travel to pt's home for PT.

## 2020-05-24 NOTE — Telephone Encounter (Signed)
I've sent the referral out to four different home health agencies and no one is able to due to location and staffing issues.

## 2020-05-25 DIAGNOSIS — U071 COVID-19: Secondary | ICD-10-CM | POA: Diagnosis not present

## 2020-05-25 DIAGNOSIS — Z1389 Encounter for screening for other disorder: Secondary | ICD-10-CM | POA: Diagnosis not present

## 2020-05-25 DIAGNOSIS — J189 Pneumonia, unspecified organism: Secondary | ICD-10-CM | POA: Diagnosis not present

## 2020-05-25 DIAGNOSIS — Z6824 Body mass index (BMI) 24.0-24.9, adult: Secondary | ICD-10-CM | POA: Diagnosis not present

## 2020-05-25 DIAGNOSIS — C50919 Malignant neoplasm of unspecified site of unspecified female breast: Secondary | ICD-10-CM | POA: Diagnosis not present

## 2020-05-28 ENCOUNTER — Telehealth: Payer: Self-pay | Admitting: Pulmonary Disease

## 2020-05-28 NOTE — Telephone Encounter (Signed)
See additional encounter from 05/28/20.

## 2020-05-28 NOTE — Telephone Encounter (Signed)
Called and spoke with patient. She stated that her and daughter found another company called [facility] in [place]. Phone number is [phone], fax number is [phone].   PCCs, can we send the order to [facility]? Thanks!

## 2020-05-28 NOTE — Telephone Encounter (Signed)
Called and spoke with patient. She stated that she started running a fever on Saturday. She last checked her temp last night was at 100.16F. When I spoke with her on the phone, she stated that she had just taken some OTC Tylenol. She denied any increase in SOB, chest pain, or body aches. Just the fever. She said she was told to let Dr. Vaughan Browner know if and when she ran fevers.   While on the phone, she was checking on the status of the PT referral. I reviewed her chart, it appears that the order had been sent to several agencies but they all denied her due to the staffing shortage. I spoke with her daughter on the phone. She stated that they had recently looked up a few agencies. Some of the agencies we had already sent the order to. She wanted to see if we could send the PT order to [facility] in [place].   Dr. Vaughan Browner, please advise. Thanks.

## 2020-05-28 NOTE — Telephone Encounter (Signed)
Okay to send the PT order to the different agencies  Continue monitoring fever, take Tylenol as needed Let us know if it is persistent and above 101, associated with increased cough, shortness of breath

## 2020-05-28 NOTE — Telephone Encounter (Signed)
Called and spoke with patient. She is aware to call us as soon as she starts to have any symptoms. She is also aware that I will see if the PT order can be sent to [facility] in [place].   Nothing further needed at time of call.

## 2020-05-29 NOTE — Telephone Encounter (Signed)
Referral faxed to [facility] in [place].

## 2020-05-29 NOTE — Telephone Encounter (Signed)
Called and spoke with pt letting her know that [facility] faxed referral for PT to [facility] in [place] and we received a fax confirmation back that the referral went through. Pt verbalized understanding and stated that she would follow up with [facility] about the PT start. Nothing further needed.

## 2020-05-29 NOTE — Telephone Encounter (Signed)
Rec'd faxed confirm from [facility] in [place]. Faxed to the # that was given by the rep when I called [phone].

## 2020-05-30 DIAGNOSIS — J84116 Cryptogenic organizing pneumonia: Secondary | ICD-10-CM | POA: Diagnosis not present

## 2020-05-30 DIAGNOSIS — Z87891 Personal history of nicotine dependence: Secondary | ICD-10-CM | POA: Diagnosis not present

## 2020-05-30 DIAGNOSIS — J1282 Pneumonia due to coronavirus disease 2019: Secondary | ICD-10-CM | POA: Diagnosis not present

## 2020-05-30 DIAGNOSIS — Z7984 Long term (current) use of oral hypoglycemic drugs: Secondary | ICD-10-CM | POA: Diagnosis not present

## 2020-05-30 DIAGNOSIS — J9621 Acute and chronic respiratory failure with hypoxia: Secondary | ICD-10-CM | POA: Diagnosis not present

## 2020-05-30 DIAGNOSIS — Z9981 Dependence on supplemental oxygen: Secondary | ICD-10-CM | POA: Diagnosis not present

## 2020-05-30 DIAGNOSIS — U071 COVID-19: Secondary | ICD-10-CM | POA: Diagnosis not present

## 2020-05-30 DIAGNOSIS — M62569 Muscle wasting and atrophy, not elsewhere classified, unspecified lower leg: Secondary | ICD-10-CM | POA: Diagnosis not present

## 2020-05-30 DIAGNOSIS — R7303 Prediabetes: Secondary | ICD-10-CM | POA: Diagnosis not present

## 2020-05-30 DIAGNOSIS — Z7952 Long term (current) use of systemic steroids: Secondary | ICD-10-CM | POA: Diagnosis not present

## 2020-05-30 DIAGNOSIS — G360 Neuromyelitis optica [Devic]: Secondary | ICD-10-CM | POA: Diagnosis not present

## 2020-05-30 DIAGNOSIS — C50412 Malignant neoplasm of upper-outer quadrant of left female breast: Secondary | ICD-10-CM | POA: Diagnosis not present

## 2020-05-30 DIAGNOSIS — E46 Unspecified protein-calorie malnutrition: Secondary | ICD-10-CM | POA: Diagnosis not present

## 2020-06-01 ENCOUNTER — Telehealth: Payer: Self-pay | Admitting: Primary Care

## 2020-06-01 ENCOUNTER — Telehealth (HOSPITAL_COMMUNITY): Payer: Self-pay | Admitting: Occupational Therapy

## 2020-06-01 DIAGNOSIS — M62569 Muscle wasting and atrophy, not elsewhere classified, unspecified lower leg: Secondary | ICD-10-CM | POA: Diagnosis not present

## 2020-06-01 DIAGNOSIS — Z87891 Personal history of nicotine dependence: Secondary | ICD-10-CM | POA: Diagnosis not present

## 2020-06-01 DIAGNOSIS — R7303 Prediabetes: Secondary | ICD-10-CM | POA: Diagnosis not present

## 2020-06-01 DIAGNOSIS — Z7952 Long term (current) use of systemic steroids: Secondary | ICD-10-CM | POA: Diagnosis not present

## 2020-06-01 DIAGNOSIS — E46 Unspecified protein-calorie malnutrition: Secondary | ICD-10-CM | POA: Diagnosis not present

## 2020-06-01 DIAGNOSIS — J1282 Pneumonia due to coronavirus disease 2019: Secondary | ICD-10-CM | POA: Diagnosis not present

## 2020-06-01 DIAGNOSIS — J84116 Cryptogenic organizing pneumonia: Secondary | ICD-10-CM | POA: Diagnosis not present

## 2020-06-01 DIAGNOSIS — U071 COVID-19: Secondary | ICD-10-CM | POA: Diagnosis not present

## 2020-06-01 DIAGNOSIS — J9621 Acute and chronic respiratory failure with hypoxia: Secondary | ICD-10-CM | POA: Diagnosis not present

## 2020-06-01 DIAGNOSIS — G360 Neuromyelitis optica [Devic]: Secondary | ICD-10-CM | POA: Diagnosis not present

## 2020-06-01 DIAGNOSIS — Z7984 Long term (current) use of oral hypoglycemic drugs: Secondary | ICD-10-CM | POA: Diagnosis not present

## 2020-06-01 DIAGNOSIS — C50412 Malignant neoplasm of upper-outer quadrant of left female breast: Secondary | ICD-10-CM | POA: Diagnosis not present

## 2020-06-01 DIAGNOSIS — Z9981 Dependence on supplemental oxygen: Secondary | ICD-10-CM | POA: Diagnosis not present

## 2020-06-01 NOTE — Telephone Encounter (Signed)
Patient Called she will get [facility] and request to close these referral for OT and PT.

## 2020-06-01 NOTE — Telephone Encounter (Signed)
ATC x1, left detailed vm for Gaspar Bidding letting him know ok to place orders requested.  Told to call with any questions.

## 2020-06-04 DIAGNOSIS — G369 Acute disseminated demyelination, unspecified: Secondary | ICD-10-CM | POA: Diagnosis not present

## 2020-06-04 DIAGNOSIS — G360 Neuromyelitis optica [Devic]: Secondary | ICD-10-CM | POA: Diagnosis not present

## 2020-06-04 DIAGNOSIS — Z8616 Personal history of COVID-19: Secondary | ICD-10-CM | POA: Diagnosis not present

## 2020-06-05 ENCOUNTER — Ambulatory Visit (HOSPITAL_COMMUNITY): Payer: BC Managed Care – PPO | Admitting: Physical Therapy

## 2020-06-05 ENCOUNTER — Ambulatory Visit (HOSPITAL_COMMUNITY): Payer: BC Managed Care – PPO | Admitting: Occupational Therapy

## 2020-06-05 ENCOUNTER — Telehealth: Payer: Self-pay | Admitting: Primary Care

## 2020-06-05 NOTE — Telephone Encounter (Signed)
Called and spoke with patient to let her now that we were having a hard time finding a company to taker her on for PT. She informed me that it has already been taken care of and that [place] (maybe [facility]) has already called her. Will route back to [facility] message came from.

## 2020-06-05 NOTE — Telephone Encounter (Signed)
Martyn Ehrich, NP  Lanell Matar M; Adline Kirshenbaum, Waldemar Dickens, CMA Ok thank you, can we see if patient wants to do outpatient in the mean time?  CC: Raquel Sarna Azhia Siefken        Previous Messages   ----- Message -----  From: Faustino Congress  Sent: 05/22/2020  2:41 PM EDT  To: Martyn Ehrich, NP  Subject: PT Referral                    Annie Paras,   I am having a hard time finding a home health agency to take on this patient for physical therapy. I've tried [facility], [facility], [facility], and [facility]. They all don't have the staff for that area.   What should we do next?   Thanks   Tasia   Attempted to call pt but unable to reach. Left message for her to return call.

## 2020-06-05 NOTE — Telephone Encounter (Signed)
Patient is returning phone call. Patient phone number is [phone].

## 2020-06-06 DIAGNOSIS — C50412 Malignant neoplasm of upper-outer quadrant of left female breast: Secondary | ICD-10-CM | POA: Diagnosis not present

## 2020-06-06 DIAGNOSIS — G360 Neuromyelitis optica [Devic]: Secondary | ICD-10-CM | POA: Diagnosis not present

## 2020-06-06 DIAGNOSIS — J84116 Cryptogenic organizing pneumonia: Secondary | ICD-10-CM | POA: Diagnosis not present

## 2020-06-06 DIAGNOSIS — J1282 Pneumonia due to coronavirus disease 2019: Secondary | ICD-10-CM | POA: Diagnosis not present

## 2020-06-06 DIAGNOSIS — E46 Unspecified protein-calorie malnutrition: Secondary | ICD-10-CM | POA: Diagnosis not present

## 2020-06-06 DIAGNOSIS — M62569 Muscle wasting and atrophy, not elsewhere classified, unspecified lower leg: Secondary | ICD-10-CM | POA: Diagnosis not present

## 2020-06-06 DIAGNOSIS — Z7984 Long term (current) use of oral hypoglycemic drugs: Secondary | ICD-10-CM | POA: Diagnosis not present

## 2020-06-06 DIAGNOSIS — R7303 Prediabetes: Secondary | ICD-10-CM | POA: Diagnosis not present

## 2020-06-06 DIAGNOSIS — Z7952 Long term (current) use of systemic steroids: Secondary | ICD-10-CM | POA: Diagnosis not present

## 2020-06-06 DIAGNOSIS — U071 COVID-19: Secondary | ICD-10-CM | POA: Diagnosis not present

## 2020-06-06 DIAGNOSIS — J9621 Acute and chronic respiratory failure with hypoxia: Secondary | ICD-10-CM | POA: Diagnosis not present

## 2020-06-06 DIAGNOSIS — Z9981 Dependence on supplemental oxygen: Secondary | ICD-10-CM | POA: Diagnosis not present

## 2020-06-06 DIAGNOSIS — Z87891 Personal history of nicotine dependence: Secondary | ICD-10-CM | POA: Diagnosis not present

## 2020-06-07 DIAGNOSIS — J849 Interstitial pulmonary disease, unspecified: Secondary | ICD-10-CM | POA: Diagnosis not present

## 2020-06-07 DIAGNOSIS — J9611 Chronic respiratory failure with hypoxia: Secondary | ICD-10-CM | POA: Diagnosis not present

## 2020-06-07 DIAGNOSIS — Z8616 Personal history of COVID-19: Secondary | ICD-10-CM | POA: Diagnosis not present

## 2020-06-08 DIAGNOSIS — E46 Unspecified protein-calorie malnutrition: Secondary | ICD-10-CM | POA: Diagnosis not present

## 2020-06-08 DIAGNOSIS — M62569 Muscle wasting and atrophy, not elsewhere classified, unspecified lower leg: Secondary | ICD-10-CM | POA: Diagnosis not present

## 2020-06-08 DIAGNOSIS — Z7984 Long term (current) use of oral hypoglycemic drugs: Secondary | ICD-10-CM | POA: Diagnosis not present

## 2020-06-08 DIAGNOSIS — J9621 Acute and chronic respiratory failure with hypoxia: Secondary | ICD-10-CM | POA: Diagnosis not present

## 2020-06-08 DIAGNOSIS — Z87891 Personal history of nicotine dependence: Secondary | ICD-10-CM | POA: Diagnosis not present

## 2020-06-08 DIAGNOSIS — Z7952 Long term (current) use of systemic steroids: Secondary | ICD-10-CM | POA: Diagnosis not present

## 2020-06-08 DIAGNOSIS — Z9981 Dependence on supplemental oxygen: Secondary | ICD-10-CM | POA: Diagnosis not present

## 2020-06-08 DIAGNOSIS — C50412 Malignant neoplasm of upper-outer quadrant of left female breast: Secondary | ICD-10-CM | POA: Diagnosis not present

## 2020-06-08 DIAGNOSIS — R7303 Prediabetes: Secondary | ICD-10-CM | POA: Diagnosis not present

## 2020-06-08 DIAGNOSIS — J84116 Cryptogenic organizing pneumonia: Secondary | ICD-10-CM | POA: Diagnosis not present

## 2020-06-08 DIAGNOSIS — U071 COVID-19: Secondary | ICD-10-CM | POA: Diagnosis not present

## 2020-06-08 DIAGNOSIS — J1282 Pneumonia due to coronavirus disease 2019: Secondary | ICD-10-CM | POA: Diagnosis not present

## 2020-06-08 DIAGNOSIS — G360 Neuromyelitis optica [Devic]: Secondary | ICD-10-CM | POA: Diagnosis not present

## 2020-06-10 DIAGNOSIS — Z8616 Personal history of COVID-19: Secondary | ICD-10-CM | POA: Diagnosis not present

## 2020-06-10 DIAGNOSIS — J9611 Chronic respiratory failure with hypoxia: Secondary | ICD-10-CM | POA: Diagnosis not present

## 2020-06-11 DIAGNOSIS — Z87891 Personal history of nicotine dependence: Secondary | ICD-10-CM | POA: Diagnosis not present

## 2020-06-11 DIAGNOSIS — R7303 Prediabetes: Secondary | ICD-10-CM | POA: Diagnosis not present

## 2020-06-11 DIAGNOSIS — J1282 Pneumonia due to coronavirus disease 2019: Secondary | ICD-10-CM | POA: Diagnosis not present

## 2020-06-11 DIAGNOSIS — Z7984 Long term (current) use of oral hypoglycemic drugs: Secondary | ICD-10-CM | POA: Diagnosis not present

## 2020-06-11 DIAGNOSIS — E46 Unspecified protein-calorie malnutrition: Secondary | ICD-10-CM | POA: Diagnosis not present

## 2020-06-11 DIAGNOSIS — U071 COVID-19: Secondary | ICD-10-CM | POA: Diagnosis not present

## 2020-06-11 DIAGNOSIS — Z9981 Dependence on supplemental oxygen: Secondary | ICD-10-CM | POA: Diagnosis not present

## 2020-06-11 DIAGNOSIS — Z7952 Long term (current) use of systemic steroids: Secondary | ICD-10-CM | POA: Diagnosis not present

## 2020-06-11 DIAGNOSIS — C50412 Malignant neoplasm of upper-outer quadrant of left female breast: Secondary | ICD-10-CM | POA: Diagnosis not present

## 2020-06-11 DIAGNOSIS — J9621 Acute and chronic respiratory failure with hypoxia: Secondary | ICD-10-CM | POA: Diagnosis not present

## 2020-06-11 DIAGNOSIS — G360 Neuromyelitis optica [Devic]: Secondary | ICD-10-CM | POA: Diagnosis not present

## 2020-06-11 DIAGNOSIS — J84116 Cryptogenic organizing pneumonia: Secondary | ICD-10-CM | POA: Diagnosis not present

## 2020-06-11 DIAGNOSIS — M62569 Muscle wasting and atrophy, not elsewhere classified, unspecified lower leg: Secondary | ICD-10-CM | POA: Diagnosis not present

## 2020-06-13 DIAGNOSIS — C50412 Malignant neoplasm of upper-outer quadrant of left female breast: Secondary | ICD-10-CM | POA: Diagnosis not present

## 2020-06-13 DIAGNOSIS — J1282 Pneumonia due to coronavirus disease 2019: Secondary | ICD-10-CM | POA: Diagnosis not present

## 2020-06-13 DIAGNOSIS — J9621 Acute and chronic respiratory failure with hypoxia: Secondary | ICD-10-CM | POA: Diagnosis not present

## 2020-06-13 DIAGNOSIS — Z7952 Long term (current) use of systemic steroids: Secondary | ICD-10-CM | POA: Diagnosis not present

## 2020-06-13 DIAGNOSIS — Z87891 Personal history of nicotine dependence: Secondary | ICD-10-CM | POA: Diagnosis not present

## 2020-06-13 DIAGNOSIS — G360 Neuromyelitis optica [Devic]: Secondary | ICD-10-CM | POA: Diagnosis not present

## 2020-06-13 DIAGNOSIS — U071 COVID-19: Secondary | ICD-10-CM | POA: Diagnosis not present

## 2020-06-13 DIAGNOSIS — J84116 Cryptogenic organizing pneumonia: Secondary | ICD-10-CM | POA: Diagnosis not present

## 2020-06-13 DIAGNOSIS — R7303 Prediabetes: Secondary | ICD-10-CM | POA: Diagnosis not present

## 2020-06-13 DIAGNOSIS — E46 Unspecified protein-calorie malnutrition: Secondary | ICD-10-CM | POA: Diagnosis not present

## 2020-06-13 DIAGNOSIS — Z9981 Dependence on supplemental oxygen: Secondary | ICD-10-CM | POA: Diagnosis not present

## 2020-06-13 DIAGNOSIS — M62569 Muscle wasting and atrophy, not elsewhere classified, unspecified lower leg: Secondary | ICD-10-CM | POA: Diagnosis not present

## 2020-06-13 DIAGNOSIS — Z7984 Long term (current) use of oral hypoglycemic drugs: Secondary | ICD-10-CM | POA: Diagnosis not present

## 2020-06-14 ENCOUNTER — Ambulatory Visit (HOSPITAL_COMMUNITY)
Admission: RE | Admit: 2020-06-14 | Discharge: 2020-06-14 | Disposition: A | Discharge status: Home / Self Care | Payer: BC Managed Care – PPO | Source: Ambulatory Visit | Attending: Primary Care | Admitting: Primary Care

## 2020-06-14 DIAGNOSIS — J849 Interstitial pulmonary disease, unspecified: Secondary | ICD-10-CM | POA: Diagnosis not present

## 2020-06-14 DIAGNOSIS — J479 Bronchiectasis, uncomplicated: Secondary | ICD-10-CM | POA: Diagnosis not present

## 2020-06-14 DIAGNOSIS — R590 Localized enlarged lymph nodes: Secondary | ICD-10-CM | POA: Diagnosis not present

## 2020-06-15 DIAGNOSIS — G369 Acute disseminated demyelination, unspecified: Secondary | ICD-10-CM | POA: Diagnosis not present

## 2020-06-18 DIAGNOSIS — G360 Neuromyelitis optica [Devic]: Secondary | ICD-10-CM | POA: Diagnosis not present

## 2020-06-18 DIAGNOSIS — Z7984 Long term (current) use of oral hypoglycemic drugs: Secondary | ICD-10-CM | POA: Diagnosis not present

## 2020-06-18 DIAGNOSIS — R7303 Prediabetes: Secondary | ICD-10-CM | POA: Diagnosis not present

## 2020-06-18 DIAGNOSIS — C50412 Malignant neoplasm of upper-outer quadrant of left female breast: Secondary | ICD-10-CM | POA: Diagnosis not present

## 2020-06-18 DIAGNOSIS — Z7952 Long term (current) use of systemic steroids: Secondary | ICD-10-CM | POA: Diagnosis not present

## 2020-06-18 DIAGNOSIS — M62569 Muscle wasting and atrophy, not elsewhere classified, unspecified lower leg: Secondary | ICD-10-CM | POA: Diagnosis not present

## 2020-06-18 DIAGNOSIS — J1282 Pneumonia due to coronavirus disease 2019: Secondary | ICD-10-CM | POA: Diagnosis not present

## 2020-06-18 DIAGNOSIS — J9621 Acute and chronic respiratory failure with hypoxia: Secondary | ICD-10-CM | POA: Diagnosis not present

## 2020-06-18 DIAGNOSIS — Z9981 Dependence on supplemental oxygen: Secondary | ICD-10-CM | POA: Diagnosis not present

## 2020-06-18 DIAGNOSIS — Z87891 Personal history of nicotine dependence: Secondary | ICD-10-CM | POA: Diagnosis not present

## 2020-06-18 DIAGNOSIS — U071 COVID-19: Secondary | ICD-10-CM | POA: Diagnosis not present

## 2020-06-18 DIAGNOSIS — E46 Unspecified protein-calorie malnutrition: Secondary | ICD-10-CM | POA: Diagnosis not present

## 2020-06-18 DIAGNOSIS — J84116 Cryptogenic organizing pneumonia: Secondary | ICD-10-CM | POA: Diagnosis not present

## 2020-06-19 ENCOUNTER — Encounter: Payer: Self-pay | Admitting: Pulmonary Disease

## 2020-06-19 ENCOUNTER — Ambulatory Visit: Payer: BC Managed Care – PPO | Admitting: Pulmonary Disease

## 2020-06-19 ENCOUNTER — Other Ambulatory Visit: Payer: Self-pay

## 2020-06-19 VITALS — BP 112/68 | HR 101 | Temp 97.3°F | Ht 63.0 in | Wt 146.8 lb

## 2020-06-19 DIAGNOSIS — J849 Interstitial pulmonary disease, unspecified: Secondary | ICD-10-CM | POA: Diagnosis not present

## 2020-06-19 DIAGNOSIS — J84116 Cryptogenic organizing pneumonia: Secondary | ICD-10-CM | POA: Diagnosis not present

## 2020-06-19 DIAGNOSIS — U071 COVID-19: Secondary | ICD-10-CM | POA: Diagnosis not present

## 2020-06-19 DIAGNOSIS — J9621 Acute and chronic respiratory failure with hypoxia: Secondary | ICD-10-CM | POA: Diagnosis not present

## 2020-06-19 DIAGNOSIS — J1282 Pneumonia due to coronavirus disease 2019: Secondary | ICD-10-CM | POA: Diagnosis not present

## 2020-06-19 LAB — CBC WITH DIFFERENTIAL/PLATELET
Basophils Absolute: 0 10*3/uL (ref 0.0–0.1)
Basophils Relative: 0.2 % (ref 0.0–3.0)
Eosinophils Absolute: 0 10*3/uL (ref 0.0–0.7)
Eosinophils Relative: 0.1 % (ref 0.0–5.0)
HCT: 35.6 % — ABNORMAL LOW (ref 36.0–46.0)
Hemoglobin: 11.7 g/dL — ABNORMAL LOW (ref 12.0–15.0)
Lymphocytes Relative: 3.9 % — ABNORMAL LOW (ref 12.0–46.0)
Lymphs Abs: 0.2 10*3/uL — ABNORMAL LOW (ref 0.7–4.0)
MCHC: 33 g/dL (ref 30.0–36.0)
MCV: 90.1 fl (ref 78.0–100.0)
Monocytes Absolute: 0.2 10*3/uL (ref 0.1–1.0)
Monocytes Relative: 3.9 % (ref 3.0–12.0)
Neutro Abs: 4.6 10*3/uL (ref 1.4–7.7)
Neutrophils Relative %: 91.9 % — ABNORMAL HIGH (ref 43.0–77.0)
Platelets: 171 10*3/uL (ref 150.0–400.0)
RBC: 3.95 Mil/uL (ref 3.87–5.11)
RDW: 19.9 % — ABNORMAL HIGH (ref 11.5–15.5)
WBC: 4.9 10*3/uL (ref 4.0–10.5)

## 2020-06-19 MED ORDER — SULFAMETHOXAZOLE-TRIMETHOPRIM 800-160 MG PO TABS: 1.0000 | ORAL_TABLET | ORAL | 5 refills | Status: DC

## 2020-06-19 NOTE — Progress Notes (Signed)
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 She initially sefl quarantined for 14 days , but was admitted at the end of her quarantine when she presented with SOB. She was on bactrim at this time for UTI. She completed 4 days Remdesivir and given course of steroids. Her course was complicated by postviral bacterial pneumoniae treated with multiple rounds of antibiotics including ceftriaxone and azithromycin followed by multiple rounds of doxycycline. She has improved but continues to have a cough noticeable at night. Denies any sputum production. She has a low grade fever up until end of last week and a 30 lb weight loss since her infection started in November. She continues to follow with oncology and her breast cancer is in remission.   She was hospitalized in November 2021 at [facility] for worsening respiratory failure with CT scan showing new infiltrates. Treated with antibiotics, started on supplemental oxygen. Started on prednisone an outpatient for possible cryptogenic organizing pneumonia  Hospitalized again in early February 2022 with repeat COVID-19 infection treated with remdesivir, IV steroids and discharged on prednisone at 40 mg  Pets: Poodles  Occupation: Customer service manager, works mostly in hospitals and universities on [employer]  Exposures: no hot exposure, pari an love birds 20 years, , no down comforter or pillow Smoking history: 10 years , 1 pp week Travel history: no travel history   Relevant family history: no family history of lung disease   Interim history: Started on Imuran at 50 mg for persistent cryptogenic organizing pneumonia in early March 2022 She was hospitalized again in March 2022 with recurrent respiratory failure, she was again COVID-positive with CT showing patchy bilateral opacities.  Treated with IV Solu-Medrol, Bactrim.  Aspergillus antigen and galactomannan were negative.  She did not undergo bronchoscopy due to tenuous respiratory status She received a dose of monoclonal antibodies COVID IgG was negative  Back in clinic she continues to improve slowly with no recurrence of respiratory failure.  She is down to 1 L oxygen and is working with physical therapy at home She continues on Imuran which was increased 100 mg and end of March 2022 and prednisone with slow taper at 40 mg/day  Outpatient Encounter Medications as of 06/19/2020  Medication Sig  . acetaminophen (TYLENOL) 500 MG tablet Take 1,000 mg by mouth every 6 (six) hours as needed for fever or mild pain.  Marland Kitchen albuterol (VENTOLIN HFA) 108 (90 Base) MCG/ACT inhaler Inhale 2 puffs into the lungs every 4 (four) hours as needed for wheezing or shortness of breath.  . ALPRAZolam (XANAX) 0.5 MG tablet Take 1 tablet (0.5 mg total) by mouth 2 (two) times daily as needed for anxiety.  Marland Kitchen aspirin 81 MG tablet Take 1 tablet (81 mg total) by mouth daily.  Marland Kitchen azathioprine (IMURAN) 100 MG tablet Take 1 tablet (100 mg total) by mouth daily.  . benzonatate (TESSALON) 200 MG capsule Take 1 capsule (200 mg total) by mouth in the morning, at noon, and at bedtime.  . Cholecalciferol 25  MCG (1000 UT) tablet Take 5,000 Units by mouth daily.   . feeding supplement, GLUCERNA SHAKE, (GLUCERNA SHAKE) LIQD Take 237 mLs by mouth 3 (three) times daily between meals.  . fluticasone furoate-vilanterol (BREO ELLIPTA) 200-25 MCG/INH AEPB Inhale 1 puff into the lungs daily.  Marland Kitchen gabapentin (NEURONTIN) 300 MG capsule Take 300 mg by mouth  at bedtime.  Marland Kitchen guaiFENesin (MUCINEX) 600 MG 12 hr tablet Take 600 mg by mouth 2 (two) times daily as needed for cough or to loosen phlegm.  Marland Kitchen ipratropium-albuterol (DUONEB) 0.5-2.5 (3) MG/3ML SOLN Take 3 mLs by nebulization every 6 (six) hours as needed.  . metFORMIN (GLUCOPHAGE) 1000 MG tablet Take 1 tablet (1,000 mg total) by mouth 2 (two) times daily with a meal.  . metoprolol succinate (TOPROL-XL) 25 MG 24 hr tablet Take by mouth.  . Multiple Vitamin (MULTIVITAMIN WITH MINERALS) TABS tablet Take 1 tablet by mouth daily.  . pantoprazole (PROTONIX) 40 MG tablet Take 1 tablet (40 mg total) by mouth daily.  . predniSONE (DELTASONE) 10 MG tablet Take 40mg  daily till seen in pulmonary clinic.  Marland Kitchen sulfamethoxazole-trimethoprim (BACTRIM DS) 800-160 MG tablet Take 1 tablet by mouth 3 (three) times a week. Mon, Wed, Fri  . vitamin C (VITAMIN C) 500 MG tablet Take 1 tablet (500 mg total) by mouth daily.  Marland Kitchen zinc sulfate 220 (50 Zn) MG capsule Take 1 capsule (220 mg total) by mouth daily. (Patient taking differently: Take 50 mg by mouth daily.)   Facility-Administered Encounter Medications as of 06/19/2020  Medication  . heparin lock flush 100 unit/mL  . sodium chloride flush (NS) 0.9 % injection 10 mL  . sodium chloride flush (NS) 0.9 % injection 10 mL  . sodium chloride flush (NS) 0.9 % injection 10 mL    Physical Exam: Blood pressure 112/68, pulse (!) 101, temperature (!) 97.3 F (36.3 C), temperature source Temporal, height 5\' 3"  (1.6 m), weight 146 lb 12.8 oz (66.6 kg), SpO2 98 %. Gen:      No acute distress HEENT:  EOMI, sclera anicteric Neck:     No masses; no thyromegaly Lungs:    Clear to auscultation bilaterally; normal respiratory effort CV:         Regular rate and rhythm; no murmurs Abd:      + bowel sounds; soft, non-tender; no palpable masses, no distension Ext:    No edema; adequate peripheral perfusion Skin:      Warm and dry; no rash Neuro: alert and oriented x 3 Psych: normal  mood and affect  Data Reviewed: Imaging:  CT Angio Chest 02/27/2019: No PE, bilateral  scattered  ground glass opacity  Chest 2 view 03/22/2019: Improvement compared to prior imaging, poor expansion , opacity in lingula  HRCT 08/05/2019-improving bilateral groundglass opacities, mild subpleural fibrosis in the anterior upper lobe. HRCT 01/27/2020- new left lower lobe consolidation with groundglass opacities in the left lower lobe. Improvement in opacities in the left upper lobe. CTA 04/08/2020-extensive multifocal consolidations and groundglass opacities in the lower lobe. High-res CT 06/14/2020-improvement in bilateral airspace disease with residual fibrosis in probable UIP pattern. I have reviewed the images personally.  PFTs:  09/01/2019 FVC 2.70 [79%], FEV1 2.37 [89%], F/F 88, TLC 4.08 [80%], DLCO 13.06 [63%] Minimal reversible obstruction, moderate diffusion defect  6-minute walk test 09/20/2019- 41 m, nadir O2 sat of 98%  Labs: CBC 04/08/2019-WBC 4.7, eos 10%, absolute eosinophil count 470  Assessment:  Recurrent post Covid pneumonia Cryptogenic organizing pneumonia She has had 4 episodes  of COVID-19 with persistent respiratory failure secondary to cryptogenic organizing pneumonia. Immunocompromised at baseline due to Rituxan  She received monoclonal antibody at her last admission and is now on Imuran with slow improvement I have reviewed her CT from last week which shows improvement in bilateral infiltrates with residual fibrosis  Continue Imuran at 100 mg/day Will taper prednisone very slowly as she has not tolerated a rapid taper in the past.  Taper prednisone slowly to 30 mg/day for 1 month and then 20 mg/day.  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  She will check with her ophthalmologist at [facility] to see when she can get another dose.  There is no contraindication for for her continuing Rituxan from my viewpoint and may help with the COP as well  Plan/Recommendations: Continue prednisone taper to 20 mg in the next 2 months and hold at that dose, continue Bactrim prophylaxis Azathioprine at 100 mg  Marshell Garfinkel MD [facility] Please see [email or web address] for pager details.  06/19/2020, 10:50 AM   CC: Grace Sites, MD

## 2020-06-19 NOTE — Patient Instructions (Signed)
I am glad you are starting to feel better Will change your oxygen order to 5 L with portable tanks  Continue azathioprine 800 mg Reduce prednisone to 30 mg a day for 1 month and then 20 mg a day.  Hold at that dose until you can see me back in clinic in 3 months Check CBC today

## 2020-06-19 NOTE — Addendum Note (Signed)
Addended by: Elton Sin on: 06/19/2020 12:12 PM   Modules accepted: Orders

## 2020-06-20 ENCOUNTER — Telehealth: Payer: Self-pay | Admitting: Pulmonary Disease

## 2020-06-20 DIAGNOSIS — R7303 Prediabetes: Secondary | ICD-10-CM | POA: Diagnosis not present

## 2020-06-20 DIAGNOSIS — Z87891 Personal history of nicotine dependence: Secondary | ICD-10-CM | POA: Diagnosis not present

## 2020-06-20 DIAGNOSIS — J1282 Pneumonia due to coronavirus disease 2019: Secondary | ICD-10-CM | POA: Diagnosis not present

## 2020-06-20 DIAGNOSIS — U071 COVID-19: Secondary | ICD-10-CM | POA: Diagnosis not present

## 2020-06-20 DIAGNOSIS — J9621 Acute and chronic respiratory failure with hypoxia: Secondary | ICD-10-CM | POA: Diagnosis not present

## 2020-06-20 DIAGNOSIS — J9611 Chronic respiratory failure with hypoxia: Secondary | ICD-10-CM

## 2020-06-20 DIAGNOSIS — E46 Unspecified protein-calorie malnutrition: Secondary | ICD-10-CM | POA: Diagnosis not present

## 2020-06-20 DIAGNOSIS — G360 Neuromyelitis optica [Devic]: Secondary | ICD-10-CM | POA: Diagnosis not present

## 2020-06-20 DIAGNOSIS — Z7984 Long term (current) use of oral hypoglycemic drugs: Secondary | ICD-10-CM | POA: Diagnosis not present

## 2020-06-20 DIAGNOSIS — C50412 Malignant neoplasm of upper-outer quadrant of left female breast: Secondary | ICD-10-CM | POA: Diagnosis not present

## 2020-06-20 DIAGNOSIS — M62569 Muscle wasting and atrophy, not elsewhere classified, unspecified lower leg: Secondary | ICD-10-CM | POA: Diagnosis not present

## 2020-06-20 DIAGNOSIS — J84116 Cryptogenic organizing pneumonia: Secondary | ICD-10-CM | POA: Diagnosis not present

## 2020-06-20 DIAGNOSIS — Z9981 Dependence on supplemental oxygen: Secondary | ICD-10-CM | POA: Diagnosis not present

## 2020-06-20 DIAGNOSIS — Z7952 Long term (current) use of systemic steroids: Secondary | ICD-10-CM | POA: Diagnosis not present

## 2020-06-20 NOTE — Telephone Encounter (Signed)
Called and spoke with pt in regards to the call received from  [place] about order needing to be placed for oxygen discontinuation.  Asked pt about that and she said that she is trying to wean her self off of the oxygen but she is still requiring it with exertion.  Pt said when she is sitting at rest, she is good without the oxygen at rest but with exertion she does still need it but only at 1L.pt does have a pulse ox that she uses to monitor her O2 sats. Pt said she is not ready for the oxygen to be discontinued.    Called and spoke with Darlene letting her know that I called and spoke with pt and since she is still requiring the O2 with exertion, we are not going to place an order for her O2 to be discontinued.  What Darlene stated is needed is an order that states that pt can take the oxygen off and use as needed as in Darlene's notes that she has, she has that pt is to wear the O2 24/7 but now pt is using it mainly with exertion or when needed.  Dr. Vaughan Browner, please advise if you are okay with [place] placing an order that states that pt can use the oxygen as needed as patient is slowly weaning herself off of the oxygen.

## 2020-06-21 DIAGNOSIS — Z7984 Long term (current) use of oral hypoglycemic drugs: Secondary | ICD-10-CM | POA: Diagnosis not present

## 2020-06-21 DIAGNOSIS — G360 Neuromyelitis optica [Devic]: Secondary | ICD-10-CM | POA: Diagnosis not present

## 2020-06-21 DIAGNOSIS — C50412 Malignant neoplasm of upper-outer quadrant of left female breast: Secondary | ICD-10-CM | POA: Diagnosis not present

## 2020-06-21 DIAGNOSIS — J84116 Cryptogenic organizing pneumonia: Secondary | ICD-10-CM | POA: Diagnosis not present

## 2020-06-21 DIAGNOSIS — M62569 Muscle wasting and atrophy, not elsewhere classified, unspecified lower leg: Secondary | ICD-10-CM | POA: Diagnosis not present

## 2020-06-21 DIAGNOSIS — Z87891 Personal history of nicotine dependence: Secondary | ICD-10-CM | POA: Diagnosis not present

## 2020-06-21 DIAGNOSIS — R7303 Prediabetes: Secondary | ICD-10-CM | POA: Diagnosis not present

## 2020-06-21 DIAGNOSIS — E46 Unspecified protein-calorie malnutrition: Secondary | ICD-10-CM | POA: Diagnosis not present

## 2020-06-21 DIAGNOSIS — Z9981 Dependence on supplemental oxygen: Secondary | ICD-10-CM | POA: Diagnosis not present

## 2020-06-21 DIAGNOSIS — U071 COVID-19: Secondary | ICD-10-CM | POA: Diagnosis not present

## 2020-06-21 DIAGNOSIS — J9621 Acute and chronic respiratory failure with hypoxia: Secondary | ICD-10-CM | POA: Diagnosis not present

## 2020-06-21 DIAGNOSIS — J1282 Pneumonia due to coronavirus disease 2019: Secondary | ICD-10-CM | POA: Diagnosis not present

## 2020-06-21 DIAGNOSIS — Z7952 Long term (current) use of systemic steroids: Secondary | ICD-10-CM | POA: Diagnosis not present

## 2020-06-21 NOTE — Telephone Encounter (Signed)
Order has been placed for the DME company to make them aware of oxygen use for now.

## 2020-06-21 NOTE — Telephone Encounter (Signed)
Yes. It is ok to place the order

## 2020-06-25 DIAGNOSIS — Z87891 Personal history of nicotine dependence: Secondary | ICD-10-CM | POA: Diagnosis not present

## 2020-06-25 DIAGNOSIS — J9621 Acute and chronic respiratory failure with hypoxia: Secondary | ICD-10-CM | POA: Diagnosis not present

## 2020-06-25 DIAGNOSIS — M62569 Muscle wasting and atrophy, not elsewhere classified, unspecified lower leg: Secondary | ICD-10-CM | POA: Diagnosis not present

## 2020-06-25 DIAGNOSIS — Z6825 Body mass index (BMI) 25.0-25.9, adult: Secondary | ICD-10-CM | POA: Diagnosis not present

## 2020-06-25 DIAGNOSIS — J84116 Cryptogenic organizing pneumonia: Secondary | ICD-10-CM | POA: Diagnosis not present

## 2020-06-25 DIAGNOSIS — G360 Neuromyelitis optica [Devic]: Secondary | ICD-10-CM | POA: Diagnosis not present

## 2020-06-25 DIAGNOSIS — E46 Unspecified protein-calorie malnutrition: Secondary | ICD-10-CM | POA: Diagnosis not present

## 2020-06-25 DIAGNOSIS — R7303 Prediabetes: Secondary | ICD-10-CM | POA: Diagnosis not present

## 2020-06-25 DIAGNOSIS — T380X5A Adverse effect of glucocorticoids and synthetic analogues, initial encounter: Secondary | ICD-10-CM | POA: Diagnosis not present

## 2020-06-25 DIAGNOSIS — J1282 Pneumonia due to coronavirus disease 2019: Secondary | ICD-10-CM | POA: Diagnosis not present

## 2020-06-25 DIAGNOSIS — Z9981 Dependence on supplemental oxygen: Secondary | ICD-10-CM | POA: Diagnosis not present

## 2020-06-25 DIAGNOSIS — E663 Overweight: Secondary | ICD-10-CM | POA: Diagnosis not present

## 2020-06-25 DIAGNOSIS — U071 COVID-19: Secondary | ICD-10-CM | POA: Diagnosis not present

## 2020-06-25 DIAGNOSIS — Z7984 Long term (current) use of oral hypoglycemic drugs: Secondary | ICD-10-CM | POA: Diagnosis not present

## 2020-06-25 DIAGNOSIS — Z7952 Long term (current) use of systemic steroids: Secondary | ICD-10-CM | POA: Diagnosis not present

## 2020-06-25 DIAGNOSIS — C50412 Malignant neoplasm of upper-outer quadrant of left female breast: Secondary | ICD-10-CM | POA: Diagnosis not present

## 2020-06-27 ENCOUNTER — Telehealth: Payer: Self-pay | Admitting: Pulmonary Disease

## 2020-06-28 ENCOUNTER — Encounter: Payer: Self-pay | Admitting: *Deleted

## 2020-06-28 NOTE — Progress Notes (Signed)
Letter mailed to the pt. 

## 2020-06-29 DIAGNOSIS — G360 Neuromyelitis optica [Devic]: Secondary | ICD-10-CM | POA: Diagnosis not present

## 2020-07-02 NOTE — Telephone Encounter (Signed)
Rec'd signed form- Called and spoke to patient - she will come to the office and pick up the original. -pr

## 2020-07-07 DIAGNOSIS — J849 Interstitial pulmonary disease, unspecified: Secondary | ICD-10-CM | POA: Diagnosis not present

## 2020-07-07 DIAGNOSIS — Z8616 Personal history of COVID-19: Secondary | ICD-10-CM | POA: Diagnosis not present

## 2020-07-07 DIAGNOSIS — J9611 Chronic respiratory failure with hypoxia: Secondary | ICD-10-CM | POA: Diagnosis not present

## 2020-07-09 ENCOUNTER — Other Ambulatory Visit: Payer: Self-pay | Admitting: Hematology

## 2020-07-09 DIAGNOSIS — Z9889 Other specified postprocedural states: Secondary | ICD-10-CM

## 2020-07-10 DIAGNOSIS — J9611 Chronic respiratory failure with hypoxia: Secondary | ICD-10-CM | POA: Diagnosis not present

## 2020-07-10 DIAGNOSIS — Z8616 Personal history of COVID-19: Secondary | ICD-10-CM | POA: Diagnosis not present

## 2020-07-11 DIAGNOSIS — G369 Acute disseminated demyelination, unspecified: Secondary | ICD-10-CM | POA: Diagnosis not present

## 2020-07-11 DIAGNOSIS — Z79899 Other long term (current) drug therapy: Secondary | ICD-10-CM | POA: Diagnosis not present

## 2020-07-11 DIAGNOSIS — D849 Immunodeficiency, unspecified: Secondary | ICD-10-CM | POA: Diagnosis not present

## 2020-07-16 ENCOUNTER — Other Ambulatory Visit: Payer: Self-pay | Admitting: Pulmonary Disease

## 2020-07-16 ENCOUNTER — Telehealth: Payer: Self-pay | Admitting: Pulmonary Disease

## 2020-07-16 NOTE — Telephone Encounter (Signed)
Rx for pt's prednisone has been sent to pharmacy for pt. Called and spoke with pt letting her know this had been done and she verbalized understanding. Nothing further needed. 

## 2020-08-07 DIAGNOSIS — J849 Interstitial pulmonary disease, unspecified: Secondary | ICD-10-CM | POA: Diagnosis not present

## 2020-08-07 DIAGNOSIS — Z8616 Personal history of COVID-19: Secondary | ICD-10-CM | POA: Diagnosis not present

## 2020-08-07 DIAGNOSIS — J9611 Chronic respiratory failure with hypoxia: Secondary | ICD-10-CM | POA: Diagnosis not present

## 2020-08-10 DIAGNOSIS — J9611 Chronic respiratory failure with hypoxia: Secondary | ICD-10-CM | POA: Diagnosis not present

## 2020-08-10 DIAGNOSIS — Z8616 Personal history of COVID-19: Secondary | ICD-10-CM | POA: Diagnosis not present

## 2020-09-06 DIAGNOSIS — J9611 Chronic respiratory failure with hypoxia: Secondary | ICD-10-CM | POA: Diagnosis not present

## 2020-09-06 DIAGNOSIS — J849 Interstitial pulmonary disease, unspecified: Secondary | ICD-10-CM | POA: Diagnosis not present

## 2020-09-06 DIAGNOSIS — Z8616 Personal history of COVID-19: Secondary | ICD-10-CM | POA: Diagnosis not present

## 2020-09-09 DIAGNOSIS — J9611 Chronic respiratory failure with hypoxia: Secondary | ICD-10-CM | POA: Diagnosis not present

## 2020-09-09 DIAGNOSIS — Z8616 Personal history of COVID-19: Secondary | ICD-10-CM | POA: Diagnosis not present

## 2020-09-13 ENCOUNTER — Telehealth: Payer: Self-pay | Admitting: Pulmonary Disease

## 2020-09-13 NOTE — Telephone Encounter (Signed)
Call made to patient, confirmed DOB. Made aware this will have to be addressed on 09/24/20 at her appt. Made aware we cannot just D/C the oxygen but she will have to be assessed and walked before we can D/C. Voiced understanding.   Nothing further needed at this time.

## 2020-09-24 ENCOUNTER — Encounter: Payer: Self-pay | Admitting: Pulmonary Disease

## 2020-09-24 ENCOUNTER — Ambulatory Visit (INDEPENDENT_AMBULATORY_CARE_PROVIDER_SITE_OTHER): Payer: BC Managed Care – PPO | Admitting: Pulmonary Disease

## 2020-09-24 ENCOUNTER — Other Ambulatory Visit: Payer: Self-pay | Admitting: Pulmonary Disease

## 2020-09-24 ENCOUNTER — Ambulatory Visit (INDEPENDENT_AMBULATORY_CARE_PROVIDER_SITE_OTHER): Payer: BC Managed Care – PPO

## 2020-09-24 ENCOUNTER — Other Ambulatory Visit: Payer: Self-pay

## 2020-09-24 VITALS — BP 122/84 | HR 85 | Ht 63.0 in | Wt 167.4 lb

## 2020-09-24 DIAGNOSIS — J849 Interstitial pulmonary disease, unspecified: Secondary | ICD-10-CM

## 2020-09-24 DIAGNOSIS — R918 Other nonspecific abnormal finding of lung field: Secondary | ICD-10-CM | POA: Diagnosis not present

## 2020-09-24 DIAGNOSIS — Z5181 Encounter for therapeutic drug level monitoring: Secondary | ICD-10-CM

## 2020-09-24 DIAGNOSIS — J9611 Chronic respiratory failure with hypoxia: Secondary | ICD-10-CM | POA: Diagnosis not present

## 2020-09-24 DIAGNOSIS — J84116 Cryptogenic organizing pneumonia: Secondary | ICD-10-CM

## 2020-09-24 LAB — CBC WITH DIFFERENTIAL/PLATELET
Basophils Absolute: 0 10*3/uL (ref 0.0–0.1)
Basophils Relative: 0.2 % (ref 0.0–3.0)
Eosinophils Absolute: 0 10*3/uL (ref 0.0–0.7)
Eosinophils Relative: 0.2 % (ref 0.0–5.0)
HCT: 41.5 % (ref 36.0–46.0)
Hemoglobin: 13.8 g/dL (ref 12.0–15.0)
Lymphocytes Relative: 3.7 % — ABNORMAL LOW (ref 12.0–46.0)
Lymphs Abs: 0.3 10*3/uL — ABNORMAL LOW (ref 0.7–4.0)
MCHC: 33.2 g/dL (ref 30.0–36.0)
MCV: 93.9 fl (ref 78.0–100.0)
Monocytes Absolute: 0.2 10*3/uL (ref 0.1–1.0)
Monocytes Relative: 3.6 % (ref 3.0–12.0)
Neutro Abs: 6.2 10*3/uL (ref 1.4–7.7)
Neutrophils Relative %: 92.3 % — ABNORMAL HIGH (ref 43.0–77.0)
Platelets: 148 10*3/uL — ABNORMAL LOW (ref 150.0–400.0)
RBC: 4.41 Mil/uL (ref 3.87–5.11)
RDW: 15.5 % (ref 11.5–15.5)
WBC: 6.8 10*3/uL (ref 4.0–10.5)

## 2020-09-24 MED ORDER — PREDNISONE 5 MG PO TABS: 5.0000 mg | ORAL_TABLET | Freq: Every day | ORAL | 0 refills | Status: DC

## 2020-09-24 NOTE — Patient Instructions (Signed)
I am glad you are doing well with regard to breathing Will discontinue supplemental oxygen and place an order to the DME company  Start tapering prednisone to 15 mg for 2 weeks, then 10 mg for 2 weeks, then 5 mg for 2 weeks and then 2.5 mg for 2 weeks and then stop We will call in an order for new prescription of prednisone with 5 mg tablets  Continue the Bactrim and azathioprine for now  Order high-resolution CT in October 2022 Follow-up in clinic in October after CT scan

## 2020-09-24 NOTE — Progress Notes (Signed)
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 She initially sefl quarantined for 14 days , but was admitted at the end of her quarantine when she presented with SOB. She was on bactrim at this time for UTI. She completed 4 days Remdesivir and given course of steroids. Her course was complicated by postviral bacterial pneumoniae treated with multiple rounds of antibiotics including ceftriaxone and azithromycin followed by multiple rounds of doxycycline. She has improved but continues to have a cough noticeable at night. Denies any sputum production. She has a low grade fever up until end of last week and a 30 lb weight loss since her infection started in November. She continues to follow with oncology and her breast cancer is in remission.   She was hospitalized in November 2021 at [facility] for worsening respiratory failure with CT scan showing new infiltrates. Treated with antibiotics, started on supplemental oxygen. Started on prednisone an outpatient for possible cryptogenic organizing pneumonia  Hospitalized again in early February 2022 with repeat COVID-19 infection treated with remdesivir, IV steroids and discharged on prednisone at 40 mg  Started on Imuran at 100 mg for persistent cryptogenic organizing pneumonia in March 2022 She was hospitalized again in March 2022 with recurrent respiratory failure, she was again COVID-positive with CT showing patchy bilateral opacities.  Treated with IV Solu-Medrol,  Bactrim.  Aspergillus antigen and galactomannan were negative.  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  She stopped using supplemental oxygen a few months ago, back to work as a Customer service manager  She has followed up at [facility] for osteomyelitis rheumatica, continues on Rituxan every 6 months.  She received Evusheld in April 2022  Outpatient Encounter Medications as of 09/24/2020  Medication Sig   albuterol (VENTOLIN HFA) 108 (90 Base) MCG/ACT inhaler Inhale 2 puffs into the lungs every 4 (four) hours as needed for wheezing or shortness of breath.   ALPRAZolam (XANAX) 0.5 MG tablet Take 1 tablet (0.5 mg total) by mouth 2 (two) times daily as needed for anxiety.   aspirin 81 MG tablet Take 1 tablet (81 mg total) by mouth daily.   azathioprine (IMURAN) 100 MG tablet Take 1 tablet (100 mg total) by mouth daily.   Cholecalciferol 25 MCG (1000 UT) tablet Take 5,000 Units by mouth daily.    fluticasone furoate-vilanterol (BREO ELLIPTA) 200-25 MCG/INH AEPB Inhale 1 puff into the  lungs daily.   gabapentin (NEURONTIN) 300 MG capsule Take 300 mg by mouth at bedtime.   metFORMIN (GLUCOPHAGE) 1000 MG tablet Take 1 tablet (1,000 mg total) by mouth 2 (two) times daily with a meal.   metoprolol succinate (TOPROL-XL) 25 MG 24 hr tablet Take by mouth.   Multiple Vitamin (MULTIVITAMIN  WITH MINERALS) TABS tablet Take 1 tablet by mouth daily.   predniSONE (DELTASONE) 10 MG tablet TAKE '20mg'$  (2 tabs) BY MOUTH ONCE DAILY TILL  SEEN  IN  PULMONARY  CLINIC   sulfamethoxazole-trimethoprim (BACTRIM DS) 800-160 MG tablet Take 1 tablet by mouth 3 (three) times a week. Mon, Wed, Fri   vitamin C (VITAMIN C) 500 MG tablet Take 1 tablet (500 mg total) by mouth daily.   zinc sulfate 220 (50 Zn) MG capsule Take 1 capsule (220 mg total) by mouth daily. (Patient taking differently: Take 50 mg by mouth daily.)   [DISCONTINUED] acetaminophen (TYLENOL) 500 MG tablet Take 1,000 mg by mouth every 6 (six) hours as needed for fever or mild pain.   [DISCONTINUED] benzonatate (TESSALON) 200 MG capsule Take 1 capsule (200 mg total) by mouth in the morning, at noon, and at bedtime.   [DISCONTINUED] feeding supplement, GLUCERNA SHAKE, (GLUCERNA SHAKE) LIQD Take 237 mLs by mouth 3 (three) times daily between meals.   [DISCONTINUED] guaiFENesin (MUCINEX) 600 MG 12 hr tablet Take 600 mg by mouth 2 (two) times daily as needed for cough or to loosen phlegm.   [DISCONTINUED] ipratropium-albuterol (DUONEB) 0.5-2.5 (3) MG/3ML SOLN Take 3 mLs by nebulization every 6 (six) hours as needed.   [DISCONTINUED] pantoprazole (PROTONIX) 40 MG tablet Take 1 tablet (40 mg total) by mouth daily.   Facility-Administered Encounter Medications as of 09/24/2020  Medication   heparin lock flush 100 unit/mL   sodium chloride flush (NS) 0.9 % injection 10 mL   sodium chloride flush (NS) 0.9 % injection 10 mL   sodium chloride flush (NS) 0.9 % injection 10 mL    Physical Exam: Blood pressure 122/84, pulse 85, height '5\' 3"'$  (1.6 m), weight 167 lb 6.4 oz (75.9 kg), SpO2 99 %. Gen:      No acute distress HEENT:  EOMI, sclera anicteric Neck:     No masses; no thyromegaly Lungs:    Clear to auscultation bilaterally; normal respiratory effort CV:         Regular rate and rhythm; no murmurs Abd:      + bowel sounds; soft, non-tender; no  palpable masses, no distension Ext:    No edema; adequate peripheral perfusion Skin:      Warm and dry; no rash Neuro: alert and oriented x 3 Psych: normal mood and affect   Data Reviewed: Imaging:  CT Angio Chest 02/27/2019: No PE, bilateral  scattered  ground glass opacity  Chest 2 view 03/22/2019: Improvement compared to prior imaging, poor expansion , opacity in lingula  HRCT 08/05/2019-improving bilateral groundglass opacities, mild subpleural fibrosis in the anterior upper lobe. HRCT 01/27/2020- new left lower lobe consolidation with groundglass opacities in the left lower lobe. Improvement in opacities in the left upper lobe. CTA 04/08/2020-extensive multifocal consolidations and groundglass opacities in the lower lobe. High-res CT 06/14/2020-improvement in bilateral airspace disease with residual fibrosis in probable UIP pattern. I have reviewed the images personally.  PFTs:  09/01/2019 FVC 2.70 [79%], FEV1 2.37 [89%], F/F 88, TLC 4.08 [80%], DLCO 13.06 [63%] Minimal reversible obstruction, moderate diffusion defect  6-minute walk test 09/20/2019- 8 m, nadir O2 sat of  98%  Labs: CBC 04/08/2019-WBC 4.7, eos 10%, absolute eosinophil count 470  Assessment:  Recurrent post Covid pneumonia Cryptogenic organizing pneumonia She has had 4 episodes of COVID-19 with persistent respiratory failure secondary to cryptogenic organizing pneumonia. Immunocompromised at baseline due to Rituxan  She received monoclonal antibody at her last admission and is now on Imuran with slow improvement I have reviewed her CT from April  which shows improvement in bilateral infiltrates with residual fibrosis  Continue Imuran at 100 mg/day and Bactrim for now.  Check CBC while she is on the Imuran Continue slow prednisone taper to 15 mg for 2 weeks, then 10 mg for 2 weeks, then 5 mg for 2 weeks and then 2.5 mg for 2 weeks and then stop  Reassess with CT in October.  If she is stable then we will stop the  Imuran as well She did not desat on exertion today and will stop supplemental oxygen  Neuromyelitis optica On Rituxan Has received Evusheld  Plan/Recommendations: Continue prednisone taper Azathioprine at 100 mg, Bactrim Check CBC for monitoring Follow-up high-res CT in October  Mariam Helbert MD [facility] Please see [email or web address] for pager details.  09/24/2020, 9:02 AM   CC: Sharilyn Sites, MD

## 2020-09-24 NOTE — Addendum Note (Signed)
Addended by: Valerie Salts on: 09/24/2020 12:31 PM   Modules accepted: Orders

## 2020-09-24 NOTE — Addendum Note (Signed)
Addended by: Valerie Salts on: 09/24/2020 10:11 AM   Modules accepted: Orders

## 2020-09-26 ENCOUNTER — Ambulatory Visit
Admission: RE | Admit: 2020-09-26 | Discharge: 2020-09-26 | Disposition: A | Discharge status: Home / Self Care | Payer: BC Managed Care – PPO | Source: Ambulatory Visit | Attending: Hematology | Admitting: Hematology

## 2020-09-26 ENCOUNTER — Other Ambulatory Visit: Payer: Self-pay

## 2020-09-26 DIAGNOSIS — C50412 Malignant neoplasm of upper-outer quadrant of left female breast: Secondary | ICD-10-CM

## 2020-09-26 DIAGNOSIS — Z01419 Encounter for gynecological examination (general) (routine) without abnormal findings: Secondary | ICD-10-CM | POA: Diagnosis not present

## 2020-09-26 DIAGNOSIS — R922 Inconclusive mammogram: Secondary | ICD-10-CM | POA: Diagnosis not present

## 2020-09-26 DIAGNOSIS — Z6829 Body mass index (BMI) 29.0-29.9, adult: Secondary | ICD-10-CM | POA: Diagnosis not present

## 2020-09-27 ENCOUNTER — Telehealth: Payer: Self-pay | Admitting: Pulmonary Disease

## 2020-09-27 MED ORDER — PREDNISONE 5 MG PO TABS: ORAL_TABLET | ORAL | 0 refills | Status: DC

## 2020-09-27 NOTE — Telephone Encounter (Signed)
Called and spoke with pt and she stated that she did get the refill for prednisone #30 but this will not be enough to get her through the taper.  The other half of the rx for the prednisone has been sent to the pharmacy.  Nothing further is needed.

## 2020-10-03 DIAGNOSIS — D239 Other benign neoplasm of skin, unspecified: Secondary | ICD-10-CM | POA: Diagnosis not present

## 2020-10-05 ENCOUNTER — Other Ambulatory Visit: Payer: Self-pay

## 2020-10-05 ENCOUNTER — Inpatient Hospital Stay: Payer: BC Managed Care – PPO

## 2020-10-05 ENCOUNTER — Inpatient Hospital Stay: Payer: BC Managed Care – PPO | Attending: Hematology | Admitting: Hematology

## 2020-10-05 VITALS — BP 111/67 | HR 82 | Temp 98.0°F | Resp 19 | Ht 63.0 in | Wt 166.4 lb

## 2020-10-05 DIAGNOSIS — G360 Neuromyelitis optica [Devic]: Secondary | ICD-10-CM | POA: Diagnosis not present

## 2020-10-05 DIAGNOSIS — Z1231 Encounter for screening mammogram for malignant neoplasm of breast: Secondary | ICD-10-CM

## 2020-10-05 DIAGNOSIS — Z853 Personal history of malignant neoplasm of breast: Secondary | ICD-10-CM | POA: Insufficient documentation

## 2020-10-05 DIAGNOSIS — Z7982 Long term (current) use of aspirin: Secondary | ICD-10-CM | POA: Insufficient documentation

## 2020-10-05 DIAGNOSIS — Z171 Estrogen receptor negative status [ER-]: Secondary | ICD-10-CM

## 2020-10-05 DIAGNOSIS — Z800 Family history of malignant neoplasm of digestive organs: Secondary | ICD-10-CM | POA: Diagnosis not present

## 2020-10-05 DIAGNOSIS — Z86711 Personal history of pulmonary embolism: Secondary | ICD-10-CM | POA: Insufficient documentation

## 2020-10-05 DIAGNOSIS — Z803 Family history of malignant neoplasm of breast: Secondary | ICD-10-CM | POA: Diagnosis not present

## 2020-10-05 DIAGNOSIS — Z79899 Other long term (current) drug therapy: Secondary | ICD-10-CM | POA: Insufficient documentation

## 2020-10-05 DIAGNOSIS — C50412 Malignant neoplasm of upper-outer quadrant of left female breast: Secondary | ICD-10-CM

## 2020-10-05 DIAGNOSIS — Z9221 Personal history of antineoplastic chemotherapy: Secondary | ICD-10-CM | POA: Insufficient documentation

## 2020-10-05 DIAGNOSIS — Z923 Personal history of irradiation: Secondary | ICD-10-CM | POA: Insufficient documentation

## 2020-10-05 DIAGNOSIS — Z86718 Personal history of other venous thrombosis and embolism: Secondary | ICD-10-CM | POA: Diagnosis not present

## 2020-10-05 DIAGNOSIS — J45909 Unspecified asthma, uncomplicated: Secondary | ICD-10-CM | POA: Insufficient documentation

## 2020-10-05 LAB — COMPREHENSIVE METABOLIC PANEL
ALT: 30 U/L (ref 0–44)
AST: 22 U/L (ref 15–41)
Albumin: 4.4 g/dL (ref 3.5–5.0)
Alkaline Phosphatase: 62 U/L (ref 38–126)
Anion gap: 12 (ref 5–15)
BUN: 18 mg/dL (ref 6–20)
CO2: 24 mmol/L (ref 22–32)
Calcium: 10.1 mg/dL (ref 8.9–10.3)
Chloride: 104 mmol/L (ref 98–111)
Creatinine, Ser: 0.89 mg/dL (ref 0.44–1.00)
GFR, Estimated: >60 mL/min (ref >60)
Glucose, Bld: 145 mg/dL — ABNORMAL HIGH (ref 70–99)
Potassium: 4.9 mmol/L (ref 3.5–5.1)
Sodium: 140 mmol/L (ref 135–145)
Total Bilirubin: 0.7 mg/dL (ref 0.3–1.2)
Total Protein: 6.7 g/dL (ref 6.5–8.1)

## 2020-10-05 LAB — CBC WITH DIFFERENTIAL/PLATELET
Abs Immature Granulocytes: 0.06 10*3/uL (ref 0.00–0.07)
Basophils Absolute: 0 10*3/uL (ref 0.0–0.1)
Basophils Relative: 0 %
Eosinophils Absolute: 0 10*3/uL (ref 0.0–0.5)
Eosinophils Relative: 0 %
HCT: 42.1 % (ref 36.0–46.0)
Hemoglobin: 14.5 g/dL (ref 12.0–15.0)
Immature Granulocytes: 1 %
Lymphocytes Relative: 6 %
Lymphs Abs: 0.4 10*3/uL — ABNORMAL LOW (ref 0.7–4.0)
MCH: 31.1 pg (ref 26.0–34.0)
MCHC: 34.4 g/dL (ref 30.0–36.0)
MCV: 90.3 fL (ref 80.0–100.0)
Monocytes Absolute: 0.3 10*3/uL (ref 0.1–1.0)
Monocytes Relative: 4 %
Neutro Abs: 6 10*3/uL (ref 1.7–7.7)
Neutrophils Relative %: 89 %
Platelets: 172 10*3/uL (ref 150–400)
RBC: 4.66 MIL/uL (ref 3.87–5.11)
RDW: 14.6 % (ref 11.5–15.5)
WBC: 6.8 10*3/uL (ref 4.0–10.5)
nRBC: 0 % (ref 0.0–0.2)

## 2020-10-05 NOTE — Progress Notes (Signed)
[name]   [phone] [phone]   Clinic Follow up Note   Patient Care Team: Sharilyn Sites, MD as PCP - General (Family Medicine) Collene Gobble, MD as Attending Physician (Psychiatry) Rolm Bookbinder, MD as Consulting Physician (General Surgery) Truitt Merle, MD as Consulting Physician (Hematology) Kyung Rudd, MD as Consulting Physician (Radiation Oncology) Delice Bison Charlestine Massed, NP as Nurse Practitioner (Hematology and Oncology)  Date of Service: 10/05/2020  CHIEF COMPLAINT: f/u of left breast cancer, triple negative  SUMMARY OF ONCOLOGIC HISTORY: Oncology History Overview Note  Cancer Staging Malignant neoplasm of upper-outer quadrant of left female breast [facility]) Staging form: Breast, AJCC 7th Edition - Clinical stage from 01/29/2016: Stage IIB (T2, N1, M0) - Signed by Truitt Merle, MD on 02/06/2016 - Pathologic stage from 07/31/2016: T0, N0, cM0 - Signed by Truitt Merle, MD on 12/31/2016     Malignant neoplasm of upper-outer quadrant of left breast in female, estrogen receptor negative ([place])  01/28/2016 Mammogram   Diagnostic MM and US showed a 2.9cm (3.2X2.2X2.5cm on [place]) mass in [place], multiple enlarged left axillary nodes, largest 2.5cm.     01/29/2016 Initial Diagnosis   Malignant neoplasm of upper-outer quadrant of left female breast ([place])    01/29/2016 Initial Biopsy   Left breast mass and axillary node biopsy showed IDC, G3,     01/29/2016 Receptors her2   ER-, PR-, HER2-, Ki67 85%    02/11/2016 Imaging   Bilateral breast MRI with and without contrast showed a 3.3 x 2.6 x 2.6 cm mass in the upper outer left breast, and left axillary lymphadenopathy, at least 3 abnormal lymph nodes, no other additional sites of concern.    02/12/2016 Imaging   CT chest, abdomen and pelvis with contrast showed a 2.4 x 2.7 cm lesion in the upper outer left breast, left axillary node metastasis measuring up to 1.3 cm, no evidence of distant  metastasis.    02/12/2016 Imaging   Bone scan was negative for skeletal metastasis.    02/13/2016 - 07/03/2016 Neo-Adjuvant Chemotherapy   Dose dense Adriamycin 60 mg/m, Cytoxan 600 mg/m, every 2 weeks, for 4 cycles, followed by weekly carboplatin and Taxol for 12 weeks     03/27/2016 Genetic Testing   Patient has genetic testing done for personal history of breast cancer, family history of cancer. ATM [id number]>T VUS identified on the Breast/GYN panel.  Negative genetic testing for the MSH2 inversion analysis ([place] inversion). The Breast/GYN gene panel offered by [facility] includes sequencing and rearrangement analysis for the following 23 genes:  ATM, BARD1, BRCA1, BRCA2, BRIP1, CDH1, CHEK2, EPCAM, FANCC, MLH1, MSH2, MSH6, MUTYH, NBN, NF1, PALB2, PMS2, POLD1, PTEN, RAD51C, RAD51D, RECQL, and TP53.       07/11/2016 Imaging   Bilateral Breast MRI FINDINGS: Breast composition: c. Heterogeneous fibroglandular tissue.   Background parenchymal enhancement: Minimal.   Right breast: No mass or abnormal enhancement.   Left breast: No mass or abnormal enhancement. Biopsy clip is identified at the posterior left breast upper outer quadrant. The previously noted enhancing mass is not seen currently.   Lymph nodes: No abnormal appearing lymph nodes. Previously noted abnormal lymph nodes in the left axilla are currently normal size.   Ancillary findings:  None.   IMPRESSION: Known cancer.    07/31/2016 Pathology Results   Diagnosis 1. Breast, lumpectomy, Left - LOBULAR NEOPLASIA (ATYPICAL LOBULAR HYPERPLASIA). - FIBROCYSTIC CHANGES WITH ADENOSIS AND CALCIFICATIONS. - [place]. - SEE ONCOLOGY TABLE BELOW. 2. Breast, excision, Left additional  Medial Margin - LOBULAR NEOPLASIA (ATYPICAL LOBULAR HYPERPLASIA). - FIBROCYSTIC CHANGES WITH ADENOSIS AND CALCIFICATIONS. - RADIAL SCAR. - HEALING BIOPSY SITE. - SEE COMMENT. 3. Lymph node, sentinel, biopsy, Left - THERE IS  NO EVIDENCE OF CARCINOMA IN 1 OF 1 LYMPH NODE (0/1) - CHANGES CONSISTENT WITH PRIOR PROCEDURE. 4. Lymph node, sentinel, biopsy, Left - THERE IS NO EVIDENCE OF CARCINOMA IN 1 OF 1 LYMPH NODE (0/1) 5. Lymph node, sentinel, biopsy, Left - THERE IS NO EVIDENCE OF CARCINOMA IN 1 OF 1 LYMPH NODE (0/1) 6. Lymph node, sentinel, biopsy, Left - THERE IS NO EVIDENCE OF CARCINOMA IN 1 OF 1 LYMPH NODE (0/1) 7. Lymph node, sentinel, biopsy, Left - THERE IS NO EVIDENCE OF CARCINOMA IN 1 OF 1 LYMPH NODE (0/1)    07/31/2016 Surgery   LEFT BREAST RADIOACTIVE SEED GUIDED LUMPECTOMY WITH LEFT RADIOACTIVE SEED TARGETED AXILLARY SENTINEL LYMPH NODE EXCISION AND SENTINEL LYMPH NODE BIOPSY and port removal done by Dr. Donne Hazel.      08/05/2016 Pathology Results   Surgical pathology  Diagnosis 1. Breast, Mammoplasty, Left - SCLEROSING ADENOSIS WITH CALCIFICATIONS - FIBROCYSTIC CHANGES - DUCT ECTASIA - NO MALIGNANCY IDENTIFIED 2. Breast, Mammoplasty, Right - SCLEROSING ADENOSIS WITH CALCIFICATIONS - FIBROCYSTIC CHANGES - DUCT ECTASIA - NO MALIGNANCY IDENTIFIED    08/05/2016 Surgery   LEFT ONCOPLASTIC REDUCTION; RIGHT BREAST REDUCTION by Dr. Iran Planas     09/11/2016 - 10/27/2016 Radiation Therapy   Radiation with Dr. Lisbeth Renshaw  Radiation treatment dates:   09/11/2016 to 10/27/2016   Site/dose:    1. The Left breast was treated to 50.4 Gy in 28 fractions at 1.8 Gy per fraction. 2. The Left Sclav was treated to 50.4 Gy in 28 fractions at 1.8 Gy per fraction. 3. The Left breast was boosted to 10 Gy in 5 fractions at 2 Gy per fraction.    Beams/energy:    1. 3D // 10X, 15X 2. photon // 15X, 6X   Narrative: The patient tolerated radiation treatment relatively well.  She developed mild erythema within the treatment field without any moist desquamation. Using Radiaplex as directed and using neosporin under inframammary fold where skin has peeled.     01/19/2017 Mammogram   IMPRESSION: No mammographic  evidence of malignancy, status post left lumpectomy and bilateral reduction mammoplasty.       CURRENT THERAPY:  Surveillance  INTERVAL HISTORY:  Grace Moyer is here for a follow up of breast cancer. She was last seen by me on 10/07/19. She presents to the clinic alone. She reports she developed Covid (for the second time) in 04/2020. She tested positive on 04/08/20 and on 05/08/20. She spent a lot of time in the hospital because of this.   All other systems were reviewed with the patient and are negative.  MEDICAL HISTORY:  Past Medical History:  Diagnosis Date   Anxiety    Breast cancer ([place])    Breast cancer ([place])    Family history of breast cancer    Family history of colon cancer    Malignant neoplasm of upper-outer quadrant of left female breast ([place]) 01/31/2016   Neuromyelitis optica ([place])    Personal history of chemotherapy    Personal history of radiation therapy     SURGICAL HISTORY: Past Surgical History:  Procedure Laterality Date   ABDOMINAL HYSTERECTOMY     partial   BREAST BIOPSY Left 2017   BREAST LUMPECTOMY Left    2018   BREAST LUMPECTOMY WITH RADIOACTIVE SEED AND SENTINEL LYMPH NODE  BIOPSY Left 07/31/2016   Procedure: LEFT BREAST RADIOACTIVE SEED GUIDED LUMPECTOMY WITH LEFT RADIOACTIVE SEED TARGETED AXILLARY SENTINEL LYMPH NODE EXCISION AND SENTINEL LYMPH NODE BIOPSY;  Surgeon: Rolm Bookbinder, MD;  Location: [facility];  Service: General;  Laterality: Left;   BREAST REDUCTION SURGERY Bilateral 08/05/2016   Procedure: LEFT ONCOPLASTIC REDUCTION; RIGHT BREAST REDUCTION;  Surgeon: Irene Limbo, MD;  Location: [facility];  Service: Plastics;  Laterality: Bilateral;   EYE SURGERY     PORT-A-CATH REMOVAL Right 07/31/2016   Procedure: REMOVAL PORT-A-CATH;  Surgeon: Rolm Bookbinder, MD;  Location: [facility];  Service: General;  Laterality: Right;   PORTACATH PLACEMENT Right 02/11/2016   Procedure: INSERTION  PORT-A-CATH WITH [place];  Surgeon: Rolm Bookbinder, MD;  Location: [facility];  Service: General;  Laterality: Right;   REDUCTION MAMMAPLASTY      I have reviewed the social history and family history with the patient and they are unchanged from previous note.  ALLERGIES:  is allergic to hydrocodone.  MEDICATIONS:  Current Outpatient Medications  Medication Sig Dispense Refill   albuterol (VENTOLIN HFA) 108 (90 Base) MCG/ACT inhaler Inhale 2 puffs into the lungs every 4 (four) hours as needed for wheezing or shortness of breath. 6.7 g 3   ALPRAZolam (XANAX) 0.5 MG tablet Take 1 tablet (0.5 mg total) by mouth 2 (two) times daily as needed for anxiety. 12 tablet 0   aspirin 81 MG tablet Take 1 tablet (81 mg total) by mouth daily. 30 tablet    azathioprine (IMURAN) 100 MG tablet Take 1 tablet (100 mg total) by mouth daily. 30 tablet 5   Cholecalciferol 25 MCG (1000 UT) tablet Take 5,000 Units by mouth daily.      fluticasone furoate-vilanterol (BREO ELLIPTA) 200-25 MCG/INH AEPB Inhale 1 puff into the lungs daily. 60 each 5   gabapentin (NEURONTIN) 300 MG capsule Take 300 mg by mouth at bedtime.     metFORMIN (GLUCOPHAGE) 1000 MG tablet Take 1 tablet (1,000 mg total) by mouth 2 (two) times daily with a meal. 60 tablet 0   metoprolol succinate (TOPROL-XL) 25 MG 24 hr tablet Take by mouth.     Multiple Vitamin (MULTIVITAMIN WITH MINERALS) TABS tablet Take 1 tablet by mouth daily. 30 tablet 0   predniSONE (DELTASONE) 10 MG tablet TAKE 72m (2 tabs) BY MOUTH ONCE DAILY TILL  SEEN  IN  PULMONARY  CLINIC 60 tablet 1   predniSONE (DELTASONE) 5 MG tablet Taper as directed from OV on 09/24/20 61 tablet 0   sulfamethoxazole-trimethoprim (BACTRIM DS) 800-160 MG tablet Take 1 tablet by mouth 3 (three) times a week. Mon, Wed, Fri 15 tablet 5   vitamin C (VITAMIN C) 500 MG tablet Take 1 tablet (500 mg total) by mouth daily. 30 tablet 0   zinc sulfate 220 (50 Zn) MG capsule Take 1 capsule (220 mg  total) by mouth daily. (Patient taking differently: Take 50 mg by mouth daily.) 30 capsule 0   No current facility-administered medications for this visit.   Facility-Administered Medications Ordered in Other Visits  Medication Dose Route Frequency Provider Last Rate Last Admin   heparin lock flush 100 unit/mL  500 Units Intravenous Once PRN FTruitt Merle MD       sodium chloride flush (NS) 0.9 % injection 10 mL  10 mL Intracatheter PRN FTruitt Merle MD   10 mL at 06/18/16 1540   sodium chloride flush (NS) 0.9 % injection 10 mL  10 mL Intravenous  PRN Truitt Merle, MD       sodium chloride flush (NS) 0.9 % injection 10 mL  10 mL Intravenous PRN Truitt Merle, MD   10 mL at 06/25/16 1106    PHYSICAL EXAMINATION: ECOG PERFORMANCE STATUS: 1 - Symptomatic but completely ambulatory  Vitals:   10/05/20 1418  BP: 111/67  Pulse: 82  Resp: 19  Temp: 98 F (36.7 C)  SpO2: 99%   Wt Readings from Last 3 Encounters:  10/05/20 166 lb 6.4 oz (75.5 kg)  09/24/20 167 lb 6.4 oz (75.9 kg)  06/19/20 146 lb 12.8 oz (66.6 kg)     GENERAL:alert, no distress and comfortable SKIN: skin color, texture, turgor are normal, no rashes or significant lesions EYES: normal, Conjunctiva are pink and non-injected, sclera clear  NECK: supple, thyroid normal size, non-tender, without nodularity LYMPH:  no palpable lymphadenopathy in the cervical, axillary  LUNGS: clear to auscultation and percussion with normal breathing effort HEART: regular rate & rhythm and no murmurs and no lower extremity edema ABDOMEN:abdomen soft, non-tender and normal bowel sounds Musculoskeletal:no cyanosis of digits and no clubbing  NEURO: alert & oriented x 3 with fluent speech, no focal motor/sensory deficits BREAST: No palpable mass, nodules or adenopathy bilaterally. Breast exam benign.   LABORATORY DATA:  I have reviewed the data as listed CBC Latest Ref Rng & Units 10/05/2020 09/24/2020 06/19/2020  WBC 4.0 - 10.5 K/uL 6.8 6.8 4.9  Hemoglobin  12.0 - 15.0 g/dL 14.5 13.8 11.7(L)  Hematocrit 36.0 - 46.0 % 42.1 41.5 35.6(L)  Platelets 150 - 400 K/uL 172 148.0(L) 171.0     CMP Latest Ref Rng & Units 10/05/2020 05/12/2020 05/11/2020  Glucose 70 - 99 mg/dL 145(H) 209(H) 164(H)  BUN 6 - 20 mg/dL 18 14 20   Creatinine 0.44 - 1.00 mg/dL 0.89 0.54 0.44  Sodium 135 - 145 mmol/L 140 137 137  Potassium 3.5 - 5.1 mmol/L 4.9 4.1 3.6  Chloride 98 - 111 mmol/L 104 102 102  CO2 22 - 32 mmol/L 24 28 27   Calcium 8.9 - 10.3 mg/dL 10.1 8.8(L) 8.7(L)  Total Protein 6.5 - 8.1 g/dL 6.7 - -  Total Bilirubin 0.3 - 1.2 mg/dL 0.7 - -  Alkaline Phos 38 - 126 U/L 62 - -  AST 15 - 41 U/L 22 - -  ALT 0 - 44 U/L 30 - -      RADIOGRAPHIC STUDIES: I have personally reviewed the radiological images as listed and agreed with the findings in the report. No results found.   ASSESSMENT & PLAN:  Grace Moyer is a 60 y.o. female with   1. Malignant neoplasm of upper-outer quadrant of left breast, invasive ductal carcinoma, G3, cT2N1M0, stage IIB, ER-/PR-/HER2-, ypT0N0 -She was diagnosed in 01/2016. She is s/p neo-adjuvant ddAC and CT, left breast lumpectomy and reconstruction, adjuvant radiation.  -Her 05/2019 DEXA shows normal Bone density with lowest T-score -0.7.  -From a breast cancer standpoint, She is clinically doing well. Lab reviewed, her counts have recovered from her Covid infection. Physical exam benign. Her 09/26/20 Mammogram was normal.  She will return to screening mammograms starting next year. -She continues annual f/u with her GYN. I recommend seeing her one more time before releasing her from f/u. -Because she sees her GYN in July, I will see her once more in 18 months.    2. History of left LE DVT and bilateral acute submassive PE, provoked by surgeries -She developed extensive left lower extremity DVT with bilateral acute  submassive PE, requiring hospitalization -This is probably provoked by her breast surgery and immobility -She  completed 6 months of Xarelto in 01/2017. She is now on baby aspirin once daily, will continue.    3. Genetics -03/27/16 testing showed ATM [id number]>T VUS identified on the Breast/Gyn panel. Otherwise Negative.    4. Neuromyelitis optica spectrum disorder (NOSD) -She will continue follow up with her neurologist Dr. Moshe Cipro at [facility]  -She now has her Rituxan every 4 months at [facility].    5. H/o COVID19 pneumonia  -Antibodies detected on 01/18/19 and she tested positive for COVID19 on 02/10/19. -She received both her COVID19 vaccinations, last in 08/2019.   -She again tested positive for Covid on 04/08/20 and on 05/08/20. -She was in and out of the hospital a lot of the time for this. -She received Evusheld on 06/04/20. -She continues follow up with Dr. Vaughan Browner     Plan -Mammogram in 08/2021 -Lab and F/u in 18 months   No problem-specific Assessment & Plan notes found for this encounter.   Orders Placed This Encounter  Procedures   MM Digital Screening    Standing Status:   Future    Standing Expiration Date:   10/05/2021    Order Specific Question:   Reason for Exam (SYMPTOM  OR DIAGNOSIS REQUIRED)    Answer:   screening    Order Specific Question:   Is the patient pregnant?    Answer:   No    Order Specific Question:   Preferred imaging location?    Answer:   [facility]   All questions were answered. The patient knows to call the clinic with any problems, questions or concerns. No barriers to learning was detected. The total time spent in the appointment was 20 minutes.     Truitt Merle, MD 10/05/2020  I, Wilburn Mylar, am acting as scribe for Truitt Merle, MD.   I have reviewed the above documentation for accuracy and completeness, and I agree with the above.

## 2020-10-07 ENCOUNTER — Encounter: Payer: Self-pay | Admitting: Hematology

## 2020-10-07 DIAGNOSIS — J9611 Chronic respiratory failure with hypoxia: Secondary | ICD-10-CM | POA: Diagnosis not present

## 2020-10-07 DIAGNOSIS — J849 Interstitial pulmonary disease, unspecified: Secondary | ICD-10-CM | POA: Diagnosis not present

## 2020-10-08 DIAGNOSIS — I82409 Acute embolism and thrombosis of unspecified deep veins of unspecified lower extremity: Secondary | ICD-10-CM | POA: Diagnosis not present

## 2020-10-08 DIAGNOSIS — Z0001 Encounter for general adult medical examination with abnormal findings: Secondary | ICD-10-CM | POA: Diagnosis not present

## 2020-10-08 DIAGNOSIS — R7309 Other abnormal glucose: Secondary | ICD-10-CM | POA: Diagnosis not present

## 2020-10-08 DIAGNOSIS — E663 Overweight: Secondary | ICD-10-CM | POA: Diagnosis not present

## 2020-10-08 DIAGNOSIS — I2699 Other pulmonary embolism without acute cor pulmonale: Secondary | ICD-10-CM | POA: Diagnosis not present

## 2020-10-08 DIAGNOSIS — Z23 Encounter for immunization: Secondary | ICD-10-CM | POA: Diagnosis not present

## 2020-10-08 DIAGNOSIS — J849 Interstitial pulmonary disease, unspecified: Secondary | ICD-10-CM | POA: Diagnosis not present

## 2020-10-08 DIAGNOSIS — E782 Mixed hyperlipidemia: Secondary | ICD-10-CM | POA: Diagnosis not present

## 2020-10-08 DIAGNOSIS — C50919 Malignant neoplasm of unspecified site of unspecified female breast: Secondary | ICD-10-CM | POA: Diagnosis not present

## 2020-10-08 DIAGNOSIS — R6520 Severe sepsis without septic shock: Secondary | ICD-10-CM | POA: Diagnosis not present

## 2020-10-08 DIAGNOSIS — E559 Vitamin D deficiency, unspecified: Secondary | ICD-10-CM | POA: Diagnosis not present

## 2020-10-08 DIAGNOSIS — Z6829 Body mass index (BMI) 29.0-29.9, adult: Secondary | ICD-10-CM | POA: Diagnosis not present

## 2020-11-01 ENCOUNTER — Telehealth: Payer: Self-pay | Admitting: Pulmonary Disease

## 2020-11-01 NOTE — Telephone Encounter (Signed)
PCC's the pt is wanting to see if she can go ahead and schedule her CT that is ordered for October?  thanks

## 2020-11-02 NOTE — Telephone Encounter (Signed)
Gave pt appt info & to [place] for PA

## 2020-11-07 DIAGNOSIS — J9611 Chronic respiratory failure with hypoxia: Secondary | ICD-10-CM | POA: Diagnosis not present

## 2020-11-07 DIAGNOSIS — J849 Interstitial pulmonary disease, unspecified: Secondary | ICD-10-CM | POA: Diagnosis not present

## 2020-11-29 DIAGNOSIS — G360 Neuromyelitis optica [Devic]: Secondary | ICD-10-CM | POA: Diagnosis not present

## 2020-12-07 DIAGNOSIS — J849 Interstitial pulmonary disease, unspecified: Secondary | ICD-10-CM | POA: Diagnosis not present

## 2020-12-07 DIAGNOSIS — J9611 Chronic respiratory failure with hypoxia: Secondary | ICD-10-CM | POA: Diagnosis not present

## 2020-12-11 ENCOUNTER — Ambulatory Visit (HOSPITAL_COMMUNITY): Payer: BC Managed Care – PPO

## 2020-12-14 ENCOUNTER — Telehealth: Payer: Self-pay | Admitting: Pulmonary Disease

## 2020-12-14 MED ORDER — AZATHIOPRINE 100 MG PO TABS: 100.0000 mg | ORAL_TABLET | Freq: Every day | ORAL | 2 refills | Status: DC

## 2020-12-14 NOTE — Telephone Encounter (Signed)
Called and spoke with patient. She stated that she needed a refill on her Imuran since she would run out before her OV with Dr. Vaughan Browner next month. I advised her that I would go ahead and send this in for her. She verbalized understanding.   Nothing further needed at time of call.

## 2020-12-17 ENCOUNTER — Telehealth: Payer: Self-pay | Admitting: Pulmonary Disease

## 2020-12-17 NOTE — Telephone Encounter (Signed)
I have called [employer] [place] and they stated that the pt would like to have the CT scan done at [facility] [place].  They stated that if the order could be faxed to [phone] they will not have to change anything else on the order.  Will forward to the PCC's to see if they can fax this order and cancel the CT that is scheduled at [facility].  thanks

## 2020-12-18 ENCOUNTER — Ambulatory Visit (HOSPITAL_COMMUNITY): Payer: BC Managed Care – PPO

## 2020-12-19 NOTE — Telephone Encounter (Signed)
I will do this today.

## 2020-12-28 DIAGNOSIS — G360 Neuromyelitis optica [Devic]: Secondary | ICD-10-CM | POA: Diagnosis not present

## 2020-12-28 NOTE — Telephone Encounter (Signed)
Per insurance request, resched w/ [facility] to 11/11 @ 4 PM, check in by 3:30 pm @ [place]. Per [employer]'s request, faxed to [phone], [id number] & [phone]. Rec'd fax confirm for both fax numbers.  Updated location address w/ insurance auth.  Gave pt appt info. Nothing further needed at this time.

## 2020-12-31 ENCOUNTER — Ambulatory Visit: Payer: BC Managed Care – PPO | Admitting: Pulmonary Disease

## 2021-01-07 DIAGNOSIS — J9611 Chronic respiratory failure with hypoxia: Secondary | ICD-10-CM | POA: Diagnosis not present

## 2021-01-07 DIAGNOSIS — J849 Interstitial pulmonary disease, unspecified: Secondary | ICD-10-CM | POA: Diagnosis not present

## 2021-01-11 ENCOUNTER — Ambulatory Visit (HOSPITAL_COMMUNITY): Payer: BC Managed Care – PPO

## 2021-01-11 DIAGNOSIS — J984 Other disorders of lung: Secondary | ICD-10-CM | POA: Diagnosis not present

## 2021-01-18 ENCOUNTER — Ambulatory Visit: Payer: BC Managed Care – PPO | Admitting: Pulmonary Disease

## 2021-01-18 ENCOUNTER — Other Ambulatory Visit: Payer: Self-pay

## 2021-01-18 ENCOUNTER — Ambulatory Visit (INDEPENDENT_AMBULATORY_CARE_PROVIDER_SITE_OTHER): Payer: BC Managed Care – PPO | Admitting: Pulmonary Disease

## 2021-01-18 ENCOUNTER — Encounter: Payer: Self-pay | Admitting: Pulmonary Disease

## 2021-01-18 VITALS — BP 118/72 | HR 81 | Temp 98.1°F | Ht 64.0 in | Wt 177.6 lb

## 2021-01-18 DIAGNOSIS — J849 Interstitial pulmonary disease, unspecified: Secondary | ICD-10-CM | POA: Diagnosis not present

## 2021-01-18 DIAGNOSIS — Z8616 Personal history of COVID-19: Secondary | ICD-10-CM | POA: Diagnosis not present

## 2021-01-18 DIAGNOSIS — Z23 Encounter for immunization: Secondary | ICD-10-CM | POA: Diagnosis not present

## 2021-01-18 NOTE — Patient Instructions (Signed)
I am glad you are doing well with regard to your breathing I have reviewed the CT from [facility] which shows no evidence of interstitial lung disease which is good news.  There is mild scarring from COVID Please stop the Imuran and dapsone Continue to work on exercise and weight loss with diet  Follow-up in 6 months

## 2021-01-18 NOTE — Progress Notes (Signed)
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 She initially sefl quarantined for 14 days , but was admitted at [facility] end of her quarantine when she presented with SOB. She was on bactrim at this time for UTI. She completed 4 days Remdesivir and given course of steroids. Her course was complicated by postviral bacterial pneumoniae treated with multiple rounds of antibiotics including ceftriaxone and azithromycin followed by multiple rounds of doxycycline. She has improved but continues to have a cough noticeable at night. Denies any sputum production. She has a low grade fever up until end of last week and a 30 lb weight loss since her infection started in November. She continues to follow with oncology and her breast cancer is in remission.   She was hospitalized in November 2021 at [facility] for worsening respiratory failure with CT scan showing new infiltrates. Treated with antibiotics, started on supplemental oxygen. Started on prednisone an outpatient for possible cryptogenic organizing pneumonia  Hospitalized again in early February 2022 with repeat COVID-19 infection treated with remdesivir, IV steroids and discharged on prednisone at 40 mg  Started on Imuran at 100 mg for persistent cryptogenic organizing pneumonia in March 2022 She was hospitalized again in March 2022 with recurrent respiratory failure, she was again COVID-positive with CT showing patchy bilateral opacities.  Treated with IV Solu-Medrol,  Bactrim.  Aspergillus antigen and galactomannan were negative.  She did not undergo bronchoscopy due to tenuous respiratory status She received a dose of monoclonal antibodies COVID IgG was negative  She has followed up at [facility] for osteomyelitis rheumatica, continues on Rituxan every 6 months.  She received Evusheld in April 2022  Pets: Poodles  Occupation: Customer service manager, works mostly in hospitals and universities on [facility]  Exposures: no hot exposure, pari an love birds 20 years, , no down comforter or pillow Smoking history: 10 years , 1 pp week Travel history: no travel history  Relevant family history: no family history of lung disease   Interim history: Continues on Imuran and Bactrim for prophylaxis Has been off prednisone this States her breathing is doing well with no issues.  Here for review of CT which she got at [facility] Encounter Medications as of 01/18/2021  Medication Sig   albuterol (VENTOLIN HFA) 108 (90 Base) MCG/ACT inhaler Inhale 2 puffs into [facility] lungs every 4 (four) hours as needed for wheezing or shortness of breath.   aspirin 81 MG tablet Take 1 tablet (81 mg total) by mouth daily.   azathioprine (IMURAN) 100 MG tablet Take 1 tablet (100 mg total) by mouth daily.   Cholecalciferol 25 MCG (1000 UT) tablet Take 5,000 Units by mouth daily.    gabapentin (NEURONTIN) 300 MG capsule Take 300 mg by mouth at bedtime.   metFORMIN (GLUCOPHAGE) 1000 MG tablet Take 1 tablet (1,000 mg total) by mouth 2 (two) times daily with a meal.   sulfamethoxazole-trimethoprim (BACTRIM DS) 800-160 MG tablet Take 1 tablet by mouth 3 (three) times a week. [facility], Wed,  Fri   vitamin C (VITAMIN C) 500 MG tablet Take 1 tablet (500 mg total) by mouth daily.   zinc sulfate 220 (50 Zn) MG capsule Take 1 capsule (220 mg total) by mouth daily. (Patient taking differently: Take 50 mg by mouth daily.)   ALPRAZolam (XANAX) 0.5 MG tablet Take 1 tablet (0.5 mg total) by mouth 2 (two) times  daily as needed for anxiety.   fluticasone furoate-vilanterol (BREO ELLIPTA) 200-25 MCG/INH AEPB Inhale 1 puff into [facility] lungs daily.   metoprolol succinate (TOPROL-XL) 25 MG 24 hr tablet Take by mouth.   Multiple Vitamin (MULTIVITAMIN WITH MINERALS) TABS tablet Take 1 tablet by mouth daily.   predniSONE (DELTASONE) 10 MG tablet TAKE 20mg  (2 tabs) BY MOUTH ONCE DAILY TILL  SEEN  IN  PULMONARY  CLINIC   predniSONE (DELTASONE) 5 MG tablet Taper as directed from [facility] on 09/24/20   Facility-Administered Encounter Medications as of 01/18/2021  Medication   heparin lock flush 100 unit/mL   sodium chloride flush (NS) 0.9 % injection 10 mL   sodium chloride flush (NS) 0.9 % injection 10 mL   sodium chloride flush (NS) 0.9 % injection 10 mL    Physical Exam: Blood pressure 118/72, pulse 81, temperature 98.1 F (36.7 C), temperature source Oral, height 5\' 4"  (1.626 m), weight 177 lb 9.6 oz (80.6 kg), SpO2 99 %. Gen:      No acute distress HEENT:  EOMI, sclera anicteric Neck:     No masses; no thyromegaly Lungs:    Clear to auscultation bilaterally; normal respiratory effort CV:         Regular rate and rhythm; no murmurs Abd:      + bowel sounds; soft, non-tender; no palpable masses, no distension Ext:    No edema; adequate peripheral perfusion Skin:      Warm and dry; no rash Neuro: alert and oriented x 3 Psych: normal mood and affect   Data Reviewed: Imaging:  CT Angio Chest 02/27/2019: No PE, bilateral  scattered  ground glass opacity  Chest 2 view 03/22/2019: Improvement compared to prior imaging, poor expansion , opacity in lingula  HRCT 08/05/2019-improving bilateral groundglass opacities, mild subpleural fibrosis in [facility] anterior upper lobe. HRCT 01/27/2020- new left lower lobe consolidation with groundglass opacities in [facility] left lower lobe. Improvement in opacities in [facility] left upper lobe. CTA 04/08/2020-extensive multifocal consolidations and groundglass opacities in [facility] lower lobe. High-res  CT 06/14/2020-improvement in bilateral airspace disease with residual fibrosis in probable UIP pattern. High-res CT [facility] 01/11/2021-no evidence of interstitial lung disease.  Mild pleural parenchymal scarring in [facility] left lung and along right minor fissure I have reviewed [facility] images personally.  PFTs:  09/01/2019 FVC 2.70 [79%], FEV1 2.37 [89%], F/F 88, TLC 4.08 [80%], DLCO 13.06 [63%] Minimal reversible obstruction, moderate diffusion defect  6-minute walk test 09/20/2019- 9 m, nadir O2 sat of 98%  Labs: CBC 04/08/2019-WBC 4.7, eos 10%, absolute eosinophil count 470  Assessment:  Recurrent post Covid pneumonia Cryptogenic organizing pneumonia She has had 4 episodes of COVID-19 with persistent respiratory failure secondary to cryptogenic organizing pneumonia. Immunocompromised at baseline due to Rituxan  She received monoclonal antibody at her last admission and is now on Imuran with good response I have reviewed her CT from April  which shows improvement in bilateral infiltrates with residual fibrosis  Continue Imuran at 100 mg/day and Bactrim for now.  Check CBC while she is on [facility] Imuran Reviewed CT report from [place] which shows no evidence of  interstitial lung disease which is good news.  We will obtain [facility] CD images for our review  As she continues to do well we will discontinue [facility] Imuran and Bactrim  Neuromyelitis optica On Rituxan Has received Evusheld  Health maintenance Up-to-date with flu and COVID We will give pneumonia vaccine today  Plan/Recommendations: Discontinue Imuran and Bactrim Follow-up in 6 months  Bridgitt Raggio MD [facility] Please see [email or web address] for pager details.  01/18/2021, 10:00 AM  CC: [facility] Sites, MD

## 2021-01-18 NOTE — Addendum Note (Signed)
Addended by: Elton Sin on: 01/18/2021 10:37 AM   Modules accepted: Orders

## 2021-02-06 DIAGNOSIS — J849 Interstitial pulmonary disease, unspecified: Secondary | ICD-10-CM | POA: Diagnosis not present

## 2021-02-06 DIAGNOSIS — J9611 Chronic respiratory failure with hypoxia: Secondary | ICD-10-CM | POA: Diagnosis not present

## 2021-02-08 ENCOUNTER — Other Ambulatory Visit: Payer: BC Managed Care – PPO

## 2021-02-08 ENCOUNTER — Ambulatory Visit: Payer: BC Managed Care – PPO | Admitting: Hematology

## 2021-02-26 DIAGNOSIS — M654 Radial styloid tenosynovitis [de Quervain]: Secondary | ICD-10-CM | POA: Diagnosis not present

## 2021-02-26 DIAGNOSIS — R5383 Other fatigue: Secondary | ICD-10-CM | POA: Diagnosis not present

## 2021-02-26 DIAGNOSIS — M2550 Pain in unspecified joint: Secondary | ICD-10-CM | POA: Diagnosis not present

## 2021-03-01 DIAGNOSIS — Z23 Encounter for immunization: Secondary | ICD-10-CM | POA: Diagnosis not present

## 2021-03-01 DIAGNOSIS — T380X5A Adverse effect of glucocorticoids and synthetic analogues, initial encounter: Secondary | ICD-10-CM | POA: Diagnosis not present

## 2021-03-01 DIAGNOSIS — R7309 Other abnormal glucose: Secondary | ICD-10-CM | POA: Diagnosis not present

## 2021-03-01 DIAGNOSIS — E669 Obesity, unspecified: Secondary | ICD-10-CM | POA: Diagnosis not present

## 2021-03-01 DIAGNOSIS — Z6830 Body mass index (BMI) 30.0-30.9, adult: Secondary | ICD-10-CM | POA: Diagnosis not present

## 2021-03-09 DIAGNOSIS — J9611 Chronic respiratory failure with hypoxia: Secondary | ICD-10-CM | POA: Diagnosis not present

## 2021-03-09 DIAGNOSIS — J849 Interstitial pulmonary disease, unspecified: Secondary | ICD-10-CM | POA: Diagnosis not present

## 2021-04-09 DIAGNOSIS — J9611 Chronic respiratory failure with hypoxia: Secondary | ICD-10-CM | POA: Diagnosis not present

## 2021-04-09 DIAGNOSIS — J849 Interstitial pulmonary disease, unspecified: Secondary | ICD-10-CM | POA: Diagnosis not present

## 2021-05-02 DIAGNOSIS — M654 Radial styloid tenosynovitis [de Quervain]: Secondary | ICD-10-CM | POA: Diagnosis not present

## 2021-05-07 DIAGNOSIS — J9611 Chronic respiratory failure with hypoxia: Secondary | ICD-10-CM | POA: Diagnosis not present

## 2021-05-07 DIAGNOSIS — J849 Interstitial pulmonary disease, unspecified: Secondary | ICD-10-CM | POA: Diagnosis not present

## 2021-05-08 ENCOUNTER — Other Ambulatory Visit: Payer: Self-pay

## 2021-05-08 DIAGNOSIS — M654 Radial styloid tenosynovitis [de Quervain]: Secondary | ICD-10-CM | POA: Diagnosis not present

## 2021-05-08 DIAGNOSIS — M6588 Other synovitis and tenosynovitis, other site: Secondary | ICD-10-CM | POA: Diagnosis not present

## 2021-06-07 DIAGNOSIS — J849 Interstitial pulmonary disease, unspecified: Secondary | ICD-10-CM | POA: Diagnosis not present

## 2021-06-07 DIAGNOSIS — J9611 Chronic respiratory failure with hypoxia: Secondary | ICD-10-CM | POA: Diagnosis not present

## 2021-06-17 DIAGNOSIS — Z13820 Encounter for screening for osteoporosis: Secondary | ICD-10-CM | POA: Diagnosis not present

## 2021-06-20 ENCOUNTER — Other Ambulatory Visit: Payer: Self-pay

## 2021-06-20 DIAGNOSIS — Z79899 Other long term (current) drug therapy: Secondary | ICD-10-CM | POA: Diagnosis not present

## 2021-06-20 DIAGNOSIS — G360 Neuromyelitis optica [Devic]: Secondary | ICD-10-CM | POA: Diagnosis not present

## 2021-06-28 DIAGNOSIS — G360 Neuromyelitis optica [Devic]: Secondary | ICD-10-CM | POA: Diagnosis not present

## 2021-07-02 ENCOUNTER — Ambulatory Visit (INDEPENDENT_AMBULATORY_CARE_PROVIDER_SITE_OTHER): Payer: BC Managed Care – PPO | Admitting: Pulmonary Disease

## 2021-07-02 ENCOUNTER — Encounter: Payer: Self-pay | Admitting: Pulmonary Disease

## 2021-07-02 VITALS — BP 114/70 | HR 61 | Temp 98.2°F | Ht 63.0 in | Wt 191.0 lb

## 2021-07-02 DIAGNOSIS — J84116 Cryptogenic organizing pneumonia: Secondary | ICD-10-CM | POA: Diagnosis not present

## 2021-07-02 DIAGNOSIS — J849 Interstitial pulmonary disease, unspecified: Secondary | ICD-10-CM | POA: Diagnosis not present

## 2021-07-02 DIAGNOSIS — Z8616 Personal history of COVID-19: Secondary | ICD-10-CM | POA: Diagnosis not present

## 2021-07-02 NOTE — Patient Instructions (Signed)
I am glad you are doing well with regard to your breathing ?Follow-up as needed ?

## 2021-07-02 NOTE — Progress Notes (Signed)
? ?      ?[facility]    [id number]    31-Aug-1960 ? ?Primary Care Physician:Golding, Jenny Reichmann, MD ? ?Referring Physician: Sharilyn Sites, MD ?[address] ?[place],  [place] [address] ? ?Problem list: ?Cryptogenic organizing pneumonia ?Recurrent Covid pneumonia ? 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 She initially sefl quarantined for 14 days , but was admitted at the end of her quarantine when she presented with SOB. She was on bactrim at this time for UTI. She completed 4 days Remdesivir and given course of steroids. Her course was complicated by postviral bacterial pneumoniae treated with multiple rounds of antibiotics including ceftriaxone and azithromycin followed by multiple rounds of doxycycline. She has improved but continues to have a cough noticeable at night. Denies any sputum production. She has a low grade fever up until end of last week and a 30 lb weight loss since her infection started in November. She continues to follow with oncology and her breast cancer is in remission.  ? ?She was hospitalized in November 2021 at [facility] for worsening respiratory failure with CT scan showing new infiltrates. Treated with antibiotics, started on supplemental oxygen. Started on prednisone an outpatient for possible cryptogenic organizing pneumonia ? ?Hospitalized again in early February 2022 with repeat COVID-19 infection treated with remdesivir, IV steroids and discharged on prednisone at 40 mg ? ?Started on Imuran at 100 mg for persistent cryptogenic organizing pneumonia in March 2022 ?She was hospitalized again in March 2022 with recurrent respiratory failure, she was again COVID-positive with CT showing patchy bilateral opacities.  Treated with IV Solu-Medrol,  Bactrim.  Aspergillus antigen and galactomannan were negative.  She did not undergo bronchoscopy due to tenuous respiratory status ?She received a dose of monoclonal antibodies COVID IgG was negative ? ?She has followed up at [facility] for osteomyelitis rheumatica, continues on Rituxan every 6 months.  She received Evusheld in April 2022 ? ?Pets: Poodles  ?Occupation: Customer service manager, works mostly in hospitals and universities on [employer]  ?Exposures: no hot exposure, pari an love birds 20 years, , no down comforter or pillow ?Smoking history: 10 years , 1 pp week ?Travel history: no travel history  ?Relevant family history: no family history of lung disease  ? ?Interim history: ?Taken off Imuran and Bactrim in November 2022 ?Doing well with no issues ? ?Outpatient Encounter Medications as of 07/02/2021  ?Medication Sig  ? albuterol (VENTOLIN HFA) 108 (90 Base) MCG/ACT inhaler Inhale 2 puffs into the lungs every 4 (four) hours as needed for wheezing or shortness of breath.  ? aspirin 81 MG tablet Take 1 tablet (81 mg total) by mouth daily.  ? Cholecalciferol 25 MCG (1000 UT) tablet Take 5,000 Units by mouth daily.   ? gabapentin (NEURONTIN) 300 MG capsule Take 300 mg by mouth at bedtime.  ? vitamin C (VITAMIN C) 500 MG tablet Take 1 tablet (500 mg total) by mouth daily.  ? zinc sulfate 220 (50 Zn) MG capsule Take 1 capsule (220 mg total) by mouth daily. (Patient taking differently: Take 50 mg by mouth daily.)  ? [DISCONTINUED] ALPRAZolam (XANAX) 0.5 MG tablet Take 1 tablet (0.5 mg total) by mouth 2 (two) times daily as needed for anxiety.  ? [DISCONTINUED] azathioprine (IMURAN) 100 MG tablet  Take 1 tablet (100 mg total) by mouth daily.  ? [DISCONTINUED] fluticasone furoate-vilanterol (BREO ELLIPTA) 200-25 MCG/INH AEPB Inhale 1 puff into the lungs daily.  ? [DISCONTINUED] metFORMIN (GLUCOPHAGE) 1000 MG tablet Take 1 tablet (1,000 mg total) by mouth 2 (two) times daily with a meal.  ? [DISCONTINUED] metoprolol succinate  (TOPROL-XL) 25 MG 24 hr tablet Take by mouth.  ? [DISCONTINUED] Multiple Vitamin (MULTIVITAMIN WITH MINERALS) TABS tablet Take 1 tablet by mouth daily.  ? [DISCONTINUED] predniSONE (DELTASONE) 10 MG tablet TAKE '20mg'$  (2 tabs) BY MOUTH ONCE DAILY TILL  SEEN  IN  PULMONARY  CLINIC  ? [DISCONTINUED] predniSONE (DELTASONE) 5 MG tablet Taper as directed from [place] on 09/24/20  ? [DISCONTINUED] sulfamethoxazole-trimethoprim (BACTRIM DS) 800-160 MG tablet Take 1 tablet by mouth 3 (three) times a week. Mon, Wed, Fri  ? ?Facility-Administered Encounter Medications as of 07/02/2021  ?Medication  ? heparin lock flush 100 unit/mL  ? sodium chloride flush (NS) 0.9 % injection 10 mL  ? sodium chloride flush (NS) 0.9 % injection 10 mL  ? sodium chloride flush (NS) 0.9 % injection 10 mL  ? ? ?Physical Exam: ?Blood pressure 114/70, pulse 61, temperature 98.2 ?F (36.8 ?C), temperature source Oral, height '5\' 3"'$  (1.6 m), weight 191 lb (86.6 kg), SpO2 99 %. ?Gen:      No acute distress ?HEENT:  EOMI, sclera anicteric ?Neck:     No masses; no thyromegaly ?Lungs:    Clear to auscultation bilaterally; normal respiratory effort ?CV:         Regular rate and rhythm; no murmurs ?Abd:      + bowel sounds; soft, non-tender; no palpable masses, no distension ?Ext:    No edema; adequate peripheral perfusion ?Skin:      Warm and dry; no rash ?Neuro: alert and oriented x 3 ?Psych: normal mood and affect  ? ?Data Reviewed: ?Imaging:  ?CT Angio Chest 02/27/2019: No PE, bilateral  scattered  ground glass opacity  ?Chest 2 view 03/22/2019: Improvement compared to prior imaging, poor expansion , opacity in lingula  ?HRCT 08/05/2019-improving bilateral groundglass opacities, mild subpleural fibrosis in the anterior upper lobe. ?HRCT 01/27/2020- new left lower lobe consolidation with groundglass opacities in the left lower lobe. Improvement in opacities in the left upper lobe. ?CTA 04/08/2020-extensive multifocal consolidations and groundglass opacities in the lower  lobe. ?High-res CT 06/14/2020-improvement in bilateral airspace disease with residual fibrosis in probable UIP pattern. ?High-res CT [facility] 01/11/2021-no evidence of interstitial lung disease.  Mild pleural parenchymal scarring in the left lung and along right minor fissure ?I have reviewed the images personally. ? ?PFTs:  ?09/01/2019 ?FVC 2.70 [79%], FEV1 2.37 [89%], F/F 88, TLC 4.08 [80%], DLCO 13.06 [63%] ?Minimal reversible obstruction, moderate diffusion defect ? ?6-minute walk test 09/20/2019- 442 m, nadir O2 sat of 98% ? ?Labs: ?CBC 04/08/2019-WBC 4.7, eos 10%, absolute eosinophil count 470 ? ?Assessment:  ?Recurrent post Covid pneumonia ?Cryptogenic organizing pneumonia ?She has had 4 episodes of COVID-19 with persistent respiratory failure secondary to cryptogenic organizing pneumonia. ?Immunocompromised at baseline due to Rituxan ? ?She received monoclonal antibody during her admission and was started on Imuran with good response ?Overall her ILD has improved with no residual interstitial lung diseaseon CT scan from [facility] 2 number of 2022.  She has been taken off Imuran and Bactrim for the past 6 months and is doing well ? ?Follow-up as needed ? ?Neuromyelitis optica ?On Rituxan ?Has received Evusheld.  She discussed repeat dose of Evusheld she will with  her doctor at [facility] but was told that since incidence of COVID is low she would not needed ? ?Health maintenance ?Up-to-date with flu and COVID ?Up-to-date with pneumonia vaccine ? ?Plan/Recommendations: ?Observe off Imuran and Bactrim ? ?Follow-up as needed ? ?Marshell Garfinkel MD ?[facility] ?Please see [email or web address] for pager details.  ?07/02/2021, 4:20 PM ? ?CC: Sharilyn Sites, MD ? ? ? ?

## 2021-07-12 DIAGNOSIS — T380X5A Adverse effect of glucocorticoids and synthetic analogues, initial encounter: Secondary | ICD-10-CM | POA: Diagnosis not present

## 2021-07-12 DIAGNOSIS — C50919 Malignant neoplasm of unspecified site of unspecified female breast: Secondary | ICD-10-CM | POA: Diagnosis not present

## 2021-07-12 DIAGNOSIS — R7309 Other abnormal glucose: Secondary | ICD-10-CM | POA: Diagnosis not present

## 2021-07-12 DIAGNOSIS — G360 Neuromyelitis optica [Devic]: Secondary | ICD-10-CM | POA: Diagnosis not present

## 2021-07-12 DIAGNOSIS — Z6833 Body mass index (BMI) 33.0-33.9, adult: Secondary | ICD-10-CM | POA: Diagnosis not present

## 2021-09-25 DIAGNOSIS — D239 Other benign neoplasm of skin, unspecified: Secondary | ICD-10-CM | POA: Diagnosis not present

## 2021-09-25 DIAGNOSIS — L570 Actinic keratosis: Secondary | ICD-10-CM | POA: Diagnosis not present

## 2021-10-08 DIAGNOSIS — Z1231 Encounter for screening mammogram for malignant neoplasm of breast: Secondary | ICD-10-CM | POA: Diagnosis not present

## 2021-10-08 DIAGNOSIS — Z01419 Encounter for gynecological examination (general) (routine) without abnormal findings: Secondary | ICD-10-CM | POA: Diagnosis not present

## 2021-10-08 DIAGNOSIS — Z1272 Encounter for screening for malignant neoplasm of vagina: Secondary | ICD-10-CM | POA: Diagnosis not present

## 2021-10-08 DIAGNOSIS — Z6834 Body mass index (BMI) 34.0-34.9, adult: Secondary | ICD-10-CM | POA: Diagnosis not present

## 2021-12-16 ENCOUNTER — Encounter: Payer: Self-pay | Admitting: Internal Medicine

## 2021-12-26 DIAGNOSIS — G360 Neuromyelitis optica [Devic]: Secondary | ICD-10-CM | POA: Diagnosis not present

## 2021-12-27 DIAGNOSIS — G360 Neuromyelitis optica [Devic]: Secondary | ICD-10-CM | POA: Diagnosis not present

## 2021-12-31 ENCOUNTER — Ambulatory Visit (AMBULATORY_SURGERY_CENTER): Payer: Self-pay | Admitting: *Deleted

## 2021-12-31 ENCOUNTER — Other Ambulatory Visit: Payer: Self-pay

## 2021-12-31 VITALS — Ht 63.0 in | Wt 198.0 lb

## 2021-12-31 DIAGNOSIS — Z1211 Encounter for screening for malignant neoplasm of colon: Secondary | ICD-10-CM

## 2021-12-31 MED ORDER — NA SULFATE-K SULFATE-MG SULF 17.5-3.13-1.6 GM/177ML PO SOLN: 1.0000 | Freq: Once | ORAL | 0 refills | Status: AC

## 2021-12-31 NOTE — Progress Notes (Signed)
Pre visit completed in person.  No egg or soy allergy known to patient  No issues known to pt with past sedation with any surgeries or procedures Patient denies ever being told they had issues or difficulty with intubation  No FH of Malignant Hyperthermia Pt is not on diet pills Pt is not on  home 02  Pt is not on blood thinners  Pt denies issues with constipation  No A fib or A flutter  Pt instructed to use [email or web address] or GoodRx for a price reduction on prep   

## 2022-01-10 DIAGNOSIS — E782 Mixed hyperlipidemia: Secondary | ICD-10-CM | POA: Diagnosis not present

## 2022-01-10 DIAGNOSIS — I2699 Other pulmonary embolism without acute cor pulmonale: Secondary | ICD-10-CM | POA: Diagnosis not present

## 2022-01-10 DIAGNOSIS — G360 Neuromyelitis optica [Devic]: Secondary | ICD-10-CM | POA: Diagnosis not present

## 2022-01-10 DIAGNOSIS — J849 Interstitial pulmonary disease, unspecified: Secondary | ICD-10-CM | POA: Diagnosis not present

## 2022-01-10 DIAGNOSIS — C50919 Malignant neoplasm of unspecified site of unspecified female breast: Secondary | ICD-10-CM | POA: Diagnosis not present

## 2022-01-10 DIAGNOSIS — Z0000 Encounter for general adult medical examination without abnormal findings: Secondary | ICD-10-CM | POA: Diagnosis not present

## 2022-01-10 DIAGNOSIS — Z1331 Encounter for screening for depression: Secondary | ICD-10-CM | POA: Diagnosis not present

## 2022-01-10 DIAGNOSIS — E119 Type 2 diabetes mellitus without complications: Secondary | ICD-10-CM | POA: Diagnosis not present

## 2022-01-10 DIAGNOSIS — E559 Vitamin D deficiency, unspecified: Secondary | ICD-10-CM | POA: Diagnosis not present

## 2022-01-10 DIAGNOSIS — E6609 Other obesity due to excess calories: Secondary | ICD-10-CM | POA: Diagnosis not present

## 2022-01-10 DIAGNOSIS — Z6834 Body mass index (BMI) 34.0-34.9, adult: Secondary | ICD-10-CM | POA: Diagnosis not present

## 2022-01-10 DIAGNOSIS — Z23 Encounter for immunization: Secondary | ICD-10-CM | POA: Diagnosis not present

## 2022-01-13 ENCOUNTER — Encounter: Payer: Self-pay | Admitting: Internal Medicine

## 2022-01-17 ENCOUNTER — Ambulatory Visit (AMBULATORY_SURGERY_CENTER): Payer: BC Managed Care – PPO | Admitting: Internal Medicine

## 2022-01-17 ENCOUNTER — Encounter: Payer: Self-pay | Admitting: Internal Medicine

## 2022-01-17 VITALS — BP 148/71 | HR 60 | Temp 96.0°F | Resp 15 | Ht 63.0 in | Wt 198.0 lb

## 2022-01-17 DIAGNOSIS — Z1211 Encounter for screening for malignant neoplasm of colon: Secondary | ICD-10-CM

## 2022-01-17 DIAGNOSIS — D125 Benign neoplasm of sigmoid colon: Secondary | ICD-10-CM

## 2022-01-17 DIAGNOSIS — K635 Polyp of colon: Secondary | ICD-10-CM | POA: Diagnosis not present

## 2022-01-17 MED ORDER — SODIUM CHLORIDE 0.9 % IV SOLN: 500.0000 mL | Freq: Once | INTRAVENOUS | Status: DC

## 2022-01-17 NOTE — Progress Notes (Signed)
Pt's states no medical or surgical changes since previsit or office visit. VS assessed by D.T 

## 2022-01-17 NOTE — Op Note (Signed)
[facility] Patient Name: [facility] Procedure Date: 01/17/2022 8:57 AM MRN: [id number] Endoscopist: Adline Mango [place] , , [id number] Age: 61 Referring MD:  Date of Birth: 12/18/1960 Gender: Female Account #: [id number] Procedure:                Colonoscopy Indications:              Screening for colorectal malignant neoplasm Medicines:                Monitored Anesthesia Care Procedure:                Pre-Anesthesia Assessment:                           - Prior to the procedure, a History and Physical                            was performed, and patient medications and                            allergies were reviewed. The patient's tolerance of                            previous anesthesia was also reviewed. The risks                            and benefits of the procedure and the sedation                            options and risks were discussed with the patient.                            All questions were answered, and informed consent                            was obtained. Prior Anticoagulants: The patient has                            taken no anticoagulant or antiplatelet agents. ASA                            Grade Assessment: II - A patient with mild systemic                            disease. After reviewing the risks and benefits,                            the patient was deemed in satisfactory condition to                            undergo the procedure.                           After obtaining informed consent, the colonoscope  was passed under direct vision. Throughout the                            procedure, the patient's blood pressure, pulse, and                            oxygen saturations were monitored continuously. The                            CF [id number] #[id number] was introduced through the anus                            and advanced to the the terminal ileum. The                            colonoscopy  was performed without difficulty. The                            patient tolerated the procedure well. The quality                            of the bowel preparation was good. The terminal                            ileum, ileocecal valve, appendiceal orifice, and                            rectum were photographed. Scope In: 9:02:45 AM Scope Out: 9:24:59 AM Scope Withdrawal Time: 0 hours 16 minutes 12 seconds  Total Procedure Duration: 0 hours 22 minutes 14 seconds  Findings:                 The terminal ileum appeared normal.                           Multiple diverticula were found in the sigmoid                            colon and descending colon.                           A 5 mm polyp was found in the sigmoid colon. The                            polyp was sessile. The polyp was removed with a                            cold snare. Resection and retrieval were complete.                           Non-bleeding internal hemorrhoids were found during                            retroflexion. Complications:  No immediate complications. Estimated Blood Loss:     Estimated blood loss was minimal. Impression:               - The examined portion of the ileum was normal.                           - Diverticulosis in the sigmoid colon and in the                            descending colon.                           - One 5 mm polyp in the sigmoid colon, removed with                            a cold snare. Resected and retrieved.                           - Non-bleeding internal hemorrhoids. Recommendation:           - Discharge patient to home (with escort).                           - Await pathology results.                           - The findings and recommendations were discussed                            with the patient. Dr Georgian Co "Lyndee Leo" Lorenso Courier,  01/17/2022 9:27:03 AM

## 2022-01-17 NOTE — Progress Notes (Signed)
GASTROENTEROLOGY PROCEDURE H&P NOTE   Primary Care Physician: Sharilyn Sites, MD    Reason for Procedure:   Colon cancer screening  Plan:    Colonoscopy  Patient is appropriate for endoscopic procedure(s) in the ambulatory ([facility]) setting.  The nature of the procedure, as well as the risks, benefits, and alternatives were carefully and thoroughly reviewed with the patient. Ample time for discussion and questions allowed. The patient understood, was satisfied, and agreed to proceed.     HPI: Grace Moyer is a 61 y.o. female who presents for colonoscopy for colon cancer screening. Denies blood in stools, changes in bowel habits, or unintentional weight loss. Denies family history of colon cancer. Last colonoscopy 10 years ago only showed hemorrhoids.  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[place])    Personal history of chemotherapy    Personal history of radiation therapy     Past Surgical History:  Procedure Laterality Date   ABDOMINAL HYSTERECTOMY     partial   BREAST BIOPSY Left 2017   BREAST LUMPECTOMY Left    2018   BREAST LUMPECTOMY WITH RADIOACTIVE SEED AND SENTINEL LYMPH NODE BIOPSY Left 07/31/2016   Procedure: LEFT BREAST RADIOACTIVE SEED GUIDED LUMPECTOMY WITH LEFT RADIOACTIVE SEED TARGETED AXILLARY SENTINEL LYMPH NODE EXCISION AND SENTINEL LYMPH NODE BIOPSY;  Surgeon: Rolm Bookbinder, MD;  Location: [facility];  Service: General;  Laterality: Left;   BREAST REDUCTION SURGERY Bilateral 08/05/2016   Procedure: LEFT ONCOPLASTIC REDUCTION; RIGHT BREAST REDUCTION;  Surgeon: Irene Limbo, MD;  Location: [facility];  Service: Plastics;  Laterality: Bilateral;    EYE SURGERY     PORT-A-CATH REMOVAL Right 07/31/2016   Procedure: REMOVAL PORT-A-CATH;  Surgeon: Rolm Bookbinder, MD;  Location: [facility];  Service: General;  Laterality: Right;   PORTACATH PLACEMENT Right 02/11/2016   Procedure: INSERTION PORT-A-CATH WITH [place];  Surgeon: Rolm Bookbinder, MD;  Location: [facility];  Service: General;  Laterality: Right;   REDUCTION MAMMAPLASTY      Prior to Admission medications   Medication Sig Start Date End Date Taking? Authorizing Provider  acetaminophen (TYLENOL) 500 MG tablet Take by mouth.    [provider]  albuterol (VENTOLIN HFA) 108 (90 Base) MCG/ACT inhaler Inhale 2 puffs into the lungs every 4 (four) hours as needed for wheezing or shortness of breath. Patient not taking: Reported on 12/31/2021 02/10/20   Lauraine Rinne, NP  aspirin 81 MG tablet Take 1 tablet (81 mg total) by mouth daily. 03/30/17   Magrinat, Virgie Dad, MD  Cholecalciferol 25 MCG (1000 UT) tablet Take 5,000 Units by mouth daily.     [provider]  diclofenac Sodium (VOLTAREN) 1 % GEL SMARTSIG:2 Gram(s) Topical 3 Times Daily PRN 10/14/21   [provider]  gabapentin (NEURONTIN) 300 MG capsule Take 300 mg by mouth at bedtime.    [provider]  vitamin C (VITAMIN C) 500 MG tablet Take 1 tablet (500 mg total) by mouth daily. 02/14/19   Barton Dubois, MD  zinc sulfate 220 (50 Zn) MG capsule Take 1 capsule (220 mg total) by mouth daily. Patient taking differently: Take 50 mg by  mouth daily. 02/14/19   Barton Dubois, MD    Current Outpatient Medications  Medication Sig Dispense Refill   gabapentin (NEURONTIN) 300 MG capsule Take 300 mg by mouth at bedtime.     acetaminophen (TYLENOL) 500 MG tablet Take by mouth.     albuterol (VENTOLIN HFA) 108 (90 Base) MCG/ACT inhaler Inhale 2 puffs into the lungs every 4 (four) hours as needed for wheezing or shortness of breath. (Patient not taking: Reported on  12/31/2021) 6.7 g 3   aspirin 81 MG tablet Take 1 tablet (81 mg total) by mouth daily. 30 tablet    Cholecalciferol 25 MCG (1000 UT) tablet Take 5,000 Units by mouth daily.  (Patient not taking: Reported on 01/17/2022)     diclofenac Sodium (VOLTAREN) 1 % GEL SMARTSIG:2 Gram(s) Topical 3 Times Daily PRN (Patient not taking: Reported on 01/17/2022)     vitamin C (VITAMIN C) 500 MG tablet Take 1 tablet (500 mg total) by mouth daily. (Patient not taking: Reported on 01/17/2022) 30 tablet 0   zinc sulfate 220 (50 Zn) MG capsule Take 1 capsule (220 mg total) by mouth daily. (Patient not taking: Reported on 01/17/2022) 30 capsule 0   Current Facility-Administered Medications  Medication Dose Route Frequency Provider Last Rate Last Admin   0.9 %  sodium chloride infusion  500 mL Intravenous Once Sharyn Creamer, MD       Facility-Administered Medications Ordered in Other Visits  Medication Dose Route Frequency Provider Last Rate Last Admin   heparin lock flush 100 unit/mL  500 Units Intravenous Once PRN Truitt Merle, MD       sodium chloride flush (NS) 0.9 % injection 10 mL  10 mL Intracatheter PRN Truitt Merle, MD   10 mL at 06/18/16 1540   sodium chloride flush (NS) 0.9 % injection 10 mL  10 mL Intravenous PRN Truitt Merle, MD       sodium chloride flush (NS) 0.9 % injection 10 mL  10 mL Intravenous PRN Truitt Merle, MD   10 mL at 06/25/16 1106    Allergies as of 01/17/2022 - Review Complete 01/17/2022  Allergen Reaction Noted   Hydrocodone Other (See Comments) 07/23/2016    Family History  Problem Relation Age of Onset   Breast cancer Maternal Grandmother        dx in her 20s   CAD Paternal Grandmother    Colon cancer Paternal Uncle        dx in his 78s   COPD Maternal Grandfather    CAD Paternal Grandfather    Breast cancer Cousin        dx in her early to mid 44s; maternal first cousin   Lung cancer Paternal Uncle     Social History   Socioeconomic History   Marital status: Married     Spouse name: Not on file   Number of children: Not on file   Years of education: Not on file   Highest education level: Not on file  Occupational History   Not on file  Tobacco Use   Smoking status: Former    Packs/day: 0.10    Years: 21.00    Total pack years: 2.10    Types: Cigarettes    Start date: 10    Quit date: 09/03/2001    Years since quitting: 20.3   Smokeless tobacco: Never  Substance and Sexual Activity   Alcohol use: Yes    Comment: social   Drug use: No   Sexual activity: Not  on file  Other Topics Concern   Not on file  Social History Narrative   Not on file   Social Determinants of Health   Financial Resource Strain: Not on file  Food Insecurity: Not on file  Transportation Needs: Not on file  Physical Activity: Not on file  Stress: Not on file  Social Connections: Not on file  Intimate Partner Violence: Not on file    Physical Exam: Vital signs in last 24 hours: BP (!) 150/94   Pulse 76   Temp (!) 96 F (35.6 C) (Skin)   Ht '5\' 3"'$  (1.6 m)   Wt 198 lb (89.8 kg)   SpO2 99%   BMI 35.07 kg/m  GEN: NAD EYE: Sclerae anicteric ENT: MMM CV: Non-tachycardic Pulm: No increased work of breathing GI: Soft, NT/ND NEURO:  Alert & Oriented   Christia Reading, MD [facility]  01/17/2022 8:52 AM

## 2022-01-17 NOTE — Progress Notes (Signed)
Called to room to assist during endoscopic procedure.  Patient ID and intended procedure confirmed with present staff. Received instructions for my participation in the procedure from the performing physician.  

## 2022-01-17 NOTE — Patient Instructions (Signed)
Handouts/information given for polyps, diverticulosis and hemorrhoids.  YOU HAD AN ENDOSCOPIC PROCEDURE TODAY AT [facility]:   Refer to the procedure report that was given to you for any specific questions about what was found during the examination.  If the procedure report does not answer your questions, please call your gastroenterologist to clarify.  If you requested that your care partner not be given the details of your procedure findings, then the procedure report has been included in a sealed envelope for you to review at your convenience later.  YOU SHOULD EXPECT: Some feelings of bloating in the abdomen. Passage of more gas than usual.  Walking can help get rid of the air that was put into your GI tract during the procedure and reduce the bloating. If you had a lower endoscopy (such as a colonoscopy or flexible sigmoidoscopy) you may notice spotting of blood in your stool or on the toilet paper. If you underwent a bowel prep for your procedure, you may not have a normal bowel movement for a few days.  Please Note:  You might notice some irritation and congestion in your nose or some drainage.  This is from the oxygen used during your procedure.  There is no need for concern and it should clear up in a day or so.  SYMPTOMS TO REPORT IMMEDIATELY:  Following lower endoscopy (colonoscopy or flexible sigmoidoscopy):  Excessive amounts of blood in the stool  Significant tenderness or worsening of abdominal pains  Swelling of the abdomen that is new, acute  Fever of 100F or higher  For urgent or emergent issues, a gastroenterologist can be reached at any hour by calling [phone]. Do not use MyChart messaging for urgent concerns.    DIET:  We do recommend a small meal at first, but then you may proceed to your regular diet.  Drink plenty of fluids but you should avoid alcoholic beverages for 24 hours.  ACTIVITY:  You should plan to take it easy for the rest of today  and you should NOT DRIVE or use heavy machinery until tomorrow (because of the sedation medicines used during the test).    FOLLOW UP: Our staff will call the number listed on your records the next business day following your procedure.  We will call around 7:15- 8:00 am to check on you and address any questions or concerns that you may have regarding the information given to you following your procedure. If we do not reach you, we will leave a message.     If any biopsies were taken you will be contacted by phone or by letter within the next 1-3 weeks.  Please call us at [phone] if you have not heard about the biopsies in 3 weeks.    SIGNATURES/CONFIDENTIALITY: You and/or your care partner have signed paperwork which will be entered into your electronic medical record.  These signatures attest to the fact that that the information above on your After Visit Summary has been reviewed and is understood.  Full responsibility of the confidentiality of this discharge information lies with you and/or your care-partner.

## 2022-01-17 NOTE — Progress Notes (Signed)
Sedate, gd SR, tolerated procedure well, VSS, report to RN 

## 2022-01-20 ENCOUNTER — Telehealth: Payer: Self-pay

## 2022-01-20 NOTE — Telephone Encounter (Signed)
  Follow up Call-     01/17/2022    8:35 AM  Call back number  Post procedure Call Back phone  # [phone]  Permission to leave phone message Yes     Patient questions:  Do you have a fever, pain , or abdominal swelling? No. Pain Score  0 *  Have you tolerated food without any problems? Yes.    Have you been able to return to your normal activities? Yes.    Do you have any questions about your discharge instructions: Diet   No. Medications  No. Follow up visit  No.  Do you have questions or concerns about your Care? No.  Actions: * If pain score is 4 or above: No action needed, pain <4.

## 2022-01-27 ENCOUNTER — Encounter: Payer: Self-pay | Admitting: Internal Medicine

## 2022-02-08 DIAGNOSIS — J019 Acute sinusitis, unspecified: Secondary | ICD-10-CM | POA: Diagnosis not present

## 2022-02-08 DIAGNOSIS — Z6835 Body mass index (BMI) 35.0-35.9, adult: Secondary | ICD-10-CM | POA: Diagnosis not present

## 2022-02-08 DIAGNOSIS — E669 Obesity, unspecified: Secondary | ICD-10-CM | POA: Diagnosis not present

## 2022-02-11 DIAGNOSIS — J329 Chronic sinusitis, unspecified: Secondary | ICD-10-CM | POA: Diagnosis not present

## 2022-02-26 DIAGNOSIS — Z6835 Body mass index (BMI) 35.0-35.9, adult: Secondary | ICD-10-CM | POA: Diagnosis not present

## 2022-02-26 DIAGNOSIS — N3000 Acute cystitis without hematuria: Secondary | ICD-10-CM | POA: Diagnosis not present

## 2022-02-26 DIAGNOSIS — J101 Influenza due to other identified influenza virus with other respiratory manifestations: Secondary | ICD-10-CM | POA: Diagnosis not present

## 2022-02-26 DIAGNOSIS — E669 Obesity, unspecified: Secondary | ICD-10-CM | POA: Diagnosis not present

## 2022-03-08 ENCOUNTER — Other Ambulatory Visit: Payer: Self-pay

## 2022-03-08 ENCOUNTER — Emergency Department (HOSPITAL_COMMUNITY)
Admission: EM | Admit: 2022-03-08 | Discharge: 2022-03-08 | Disposition: A | Discharge status: Home / Self Care | Payer: BC Managed Care – PPO | Attending: Emergency Medicine | Admitting: Emergency Medicine

## 2022-03-08 ENCOUNTER — Encounter (HOSPITAL_COMMUNITY): Payer: Self-pay

## 2022-03-08 ENCOUNTER — Emergency Department (HOSPITAL_COMMUNITY): Payer: BC Managed Care – PPO

## 2022-03-08 DIAGNOSIS — R0789 Other chest pain: Secondary | ICD-10-CM | POA: Insufficient documentation

## 2022-03-08 DIAGNOSIS — R509 Fever, unspecified: Secondary | ICD-10-CM | POA: Diagnosis not present

## 2022-03-08 DIAGNOSIS — Z20822 Contact with and (suspected) exposure to covid-19: Secondary | ICD-10-CM | POA: Diagnosis not present

## 2022-03-08 DIAGNOSIS — E119 Type 2 diabetes mellitus without complications: Secondary | ICD-10-CM | POA: Insufficient documentation

## 2022-03-08 DIAGNOSIS — Z853 Personal history of malignant neoplasm of breast: Secondary | ICD-10-CM | POA: Insufficient documentation

## 2022-03-08 DIAGNOSIS — R000 Tachycardia, unspecified: Secondary | ICD-10-CM | POA: Diagnosis not present

## 2022-03-08 DIAGNOSIS — D72829 Elevated white blood cell count, unspecified: Secondary | ICD-10-CM | POA: Diagnosis not present

## 2022-03-08 DIAGNOSIS — C50919 Malignant neoplasm of unspecified site of unspecified female breast: Secondary | ICD-10-CM | POA: Insufficient documentation

## 2022-03-08 DIAGNOSIS — R059 Cough, unspecified: Secondary | ICD-10-CM | POA: Diagnosis not present

## 2022-03-08 DIAGNOSIS — R87612 Low grade squamous intraepithelial lesion on cytologic smear of cervix (LGSIL): Secondary | ICD-10-CM | POA: Insufficient documentation

## 2022-03-08 LAB — CBC WITH DIFFERENTIAL/PLATELET
Abs Immature Granulocytes: 0.14 10*3/uL — ABNORMAL HIGH (ref 0.00–0.07)
Basophils Absolute: 0 10*3/uL (ref 0.0–0.1)
Basophils Relative: 0 %
Eosinophils Absolute: 0 10*3/uL (ref 0.0–0.5)
Eosinophils Relative: 0 %
HCT: 40.5 % (ref 36.0–46.0)
Hemoglobin: 13.6 g/dL (ref 12.0–15.0)
Immature Granulocytes: 1 %
Lymphocytes Relative: 3 %
Lymphs Abs: 0.5 10*3/uL — ABNORMAL LOW (ref 0.7–4.0)
MCH: 29.5 pg (ref 26.0–34.0)
MCHC: 33.6 g/dL (ref 30.0–36.0)
MCV: 87.9 fL (ref 80.0–100.0)
Monocytes Absolute: 0.6 10*3/uL (ref 0.1–1.0)
Monocytes Relative: 5 %
Neutro Abs: 12.5 10*3/uL — ABNORMAL HIGH (ref 1.7–7.7)
Neutrophils Relative %: 91 %
Platelets: 157 10*3/uL (ref 150–400)
RBC: 4.61 MIL/uL (ref 3.87–5.11)
RDW: 13.6 % (ref 11.5–15.5)
WBC: 13.8 10*3/uL — ABNORMAL HIGH (ref 4.0–10.5)
nRBC: 0 % (ref 0.0–0.2)

## 2022-03-08 LAB — RESP PANEL BY RT-PCR (RSV, FLU A&B, COVID)  RVPGX2
Influenza A by PCR: NEGATIVE
Influenza A by PCR: NEGATIVE
Influenza B by PCR: NEGATIVE
Influenza B by PCR: NEGATIVE
Resp Syncytial Virus by PCR: NEGATIVE
Resp Syncytial Virus by PCR: NEGATIVE
SARS Coronavirus 2 by RT PCR: NEGATIVE
SARS Coronavirus 2 by RT PCR: NEGATIVE

## 2022-03-08 LAB — BASIC METABOLIC PANEL
Anion gap: 11 (ref 5–15)
BUN: 8 mg/dL (ref 6–20)
CO2: 21 mmol/L — ABNORMAL LOW (ref 22–32)
Calcium: 8.4 mg/dL — ABNORMAL LOW (ref 8.9–10.3)
Chloride: 103 mmol/L (ref 98–111)
Creatinine, Ser: 0.72 mg/dL (ref 0.44–1.00)
GFR, Estimated: >60 mL/min (ref >60)
Glucose, Bld: 142 mg/dL — ABNORMAL HIGH (ref 70–99)
Potassium: 3.6 mmol/L (ref 3.5–5.1)
Sodium: 135 mmol/L (ref 135–145)

## 2022-03-08 LAB — URINALYSIS, ROUTINE W REFLEX MICROSCOPIC
Bacteria, UA: NONE SEEN
Bilirubin Urine: NEGATIVE
Glucose, UA: NEGATIVE mg/dL
Hgb urine dipstick: NEGATIVE
Ketones, ur: 20 mg/dL — AB
Nitrite: NEGATIVE
Protein, ur: 30 mg/dL — AB
Specific Gravity, Urine: 1.025 (ref 1.005–1.030)
pH: 5 (ref 5.0–8.0)

## 2022-03-08 NOTE — ED Triage Notes (Signed)
Pt presents with fever of 103, some SOB, and pain in the right flank area. Pt states that she is scared that she has pneumonia after being diagnosed with FLU A dec 27th.

## 2022-03-08 NOTE — ED Notes (Addendum)
Pt given 16oz of water as urine was amber. Pt admits that she has not had sufficient water in the past 24 hours

## 2022-03-08 NOTE — Discharge Instructions (Signed)
You were seen today for fever and ongoing symptoms.  Your viral testing is negative.  Your x-ray does not show any obvious pneumonia and your urinalysis does not show an obvious UTI.  Monitor your symptoms closely.  If you have persistent fever over the next 2 to 3 days, follow-up closely with your primary doctor for recheck.  If you develop worsening shortness of breath, nausea, vomiting, any new or worsening symptoms, you should be reevaluated.  Make sure you are staying hydrated.  Take Tylenol or ibuprofen for fevers.

## 2022-03-08 NOTE — ED Provider Notes (Signed)
[facility] EMERGENCY DEPARTMENT Provider Note   CSN: [id number] Arrival date & time: 03/08/22  0139     History  Chief Complaint  Patient presents with   Fever    Grace Moyer is a 62 y.o. female.  HPI     This is a 62 year old female with a history of breast cancer who presents with fever, cough, right chest pain.  Patient reports recurrent history of pneumonia and recent history of influenza A.  She states she has been sick from early December.  She was treated with antibiotics for acute sinusitis.  She got somewhat better but then symptoms recurred and she was treated with steroids and azithromycin before Christmas.  She states that after Christmas she began to have fevers again and tested positive for influenza A on December 27.  She states she is having ongoing fevers up to 103 this evening.  She reports some shortness of breath and right-sided chest discomfort that is worse with coughing and breathing.  Home Medications Prior to Admission medications   Medication Sig Start Date End Date Taking? Authorizing Provider  tiZANidine (ZANAFLEX) 2 MG tablet Take by mouth. 01/20/22 01/20/23 Yes [provider]  acetaminophen (TYLENOL) 500 MG tablet Take by mouth.    [provider]  albuterol (VENTOLIN HFA) 108 (90 Base) MCG/ACT inhaler Inhale 2 puffs into the lungs every 4 (four) hours as needed for wheezing or shortness of breath. Patient not taking: Reported on 12/31/2021 02/10/20   Lauraine Rinne, NP  aspirin 81 MG tablet Take 1 tablet (81 mg total) by mouth daily. 03/30/17   Magrinat, Virgie Dad, MD  Cholecalciferol 25 MCG (1000 UT) tablet Take 5,000 Units by mouth daily.  Patient not taking: Reported on 01/17/2022    [provider]  diclofenac Sodium (VOLTAREN) 1 % GEL SMARTSIG:2 Gram(s) Topical 3 Times Daily PRN Patient not taking: Reported on 01/17/2022 10/14/21   [provider]  gabapentin (NEURONTIN) 300 MG capsule Take 300 mg by mouth  at bedtime.    [provider]  vitamin C (VITAMIN C) 500 MG tablet Take 1 tablet (500 mg total) by mouth daily. Patient not taking: Reported on 01/17/2022 02/14/19   Barton Dubois, MD  zinc sulfate 220 (50 Zn) MG capsule Take 1 capsule (220 mg total) by mouth daily. Patient not taking: Reported on 01/17/2022 02/14/19   Barton Dubois, MD      Allergies    Hydrocodone    Review of Systems   Review of Systems  Respiratory:  Positive for cough and shortness of breath.   Cardiovascular:  Positive for chest pain.  All other systems reviewed and are negative.   Physical Exam Updated Vital Signs BP 118/67   Pulse 96   Temp 100 F (37.8 C) (Oral)   Resp (!) 23   Ht 1.6 m ('5\' 3"'$ )   Wt 89.8 kg   SpO2 95%   BMI 35.07 kg/m  Physical Exam Vitals and nursing note reviewed.  Constitutional:      Appearance: She is well-developed. She is obese. She is not ill-appearing.  HENT:     Head: Normocephalic and atraumatic.  Eyes:     Pupils: Pupils are equal, round, and reactive to light.  Cardiovascular:     Rate and Rhythm: Normal rate and regular rhythm.     Heart sounds: Normal heart sounds.  Pulmonary:     Effort: Pulmonary effort is normal. No respiratory distress.     Breath sounds: No wheezing.  Comments: No significant wheezing or rales Chest:     Chest wall: No tenderness.  Abdominal:     General: Bowel sounds are normal.     Palpations: Abdomen is soft.  Musculoskeletal:     Cervical back: Neck supple.  Skin:    General: Skin is warm and dry.  Neurological:     Mental Status: She is alert and oriented to person, place, and time.  Psychiatric:        Mood and Affect: Mood normal.     ED Results / Procedures / Treatments   Labs (all labs ordered are listed, but only abnormal results are displayed) Labs Reviewed  CBC WITH DIFFERENTIAL/PLATELET - Abnormal; Notable for the following components:      Result Value   WBC 13.8 (*)    Neutro Abs 12.5 (*)     Lymphs Abs 0.5 (*)    Abs Immature Granulocytes 0.14 (*)    All other components within normal limits  BASIC METABOLIC PANEL - Abnormal; Notable for the following components:   CO2 21 (*)    Glucose, Bld 142 (*)    Calcium 8.4 (*)    All other components within normal limits  URINALYSIS, ROUTINE W REFLEX MICROSCOPIC - Abnormal; Notable for the following components:   Ketones, ur 20 (*)    Protein, ur 30 (*)    Leukocytes,Ua TRACE (*)    All other components within normal limits  RESP PANEL BY RT-PCR (RSV, FLU A&B, COVID)  RVPGX2  RESP PANEL BY RT-PCR (RSV, FLU A&B, COVID)  RVPGX2    EKG EKG Interpretation  Date/Time:  Saturday March 08 2022 02:17:00 EST Ventricular Rate:  101 PR Interval:  145 QRS Duration: 86 QT Interval:  353 QTC Calculation: 458 R Axis:   60 Text Interpretation: Sinus tachycardia Low voltage, precordial leads Abnormal inferior Q waves Borderline abnrm T, anterolateral leads Confirmed by Thayer Jew [phone]) on 03/08/2022 2:53:09 AM  Radiology DG Chest Portable 1 View  Result Date: 03/08/2022 CLINICAL DATA:  Cough, fever. EXAM: PORTABLE CHEST 1 VIEW COMPARISON:  09/24/2020. FINDINGS: The heart size and mediastinal contours are within normal limits. Lung volumes are low. No consolidation, effusion, or pneumothorax. Surgical clips are present in the left lateral chest. No acute osseous abnormality. IMPRESSION: No active disease. Electronically Signed   By: Brett Fairy M.D.   On: 03/08/2022 02:46    Procedures Procedures    Medications Ordered in ED Medications - No data to display  ED Course/ Medical Decision Making/ A&P Clinical Course as of 03/08/22 0543  Sat Mar 08, 2022  0348 I went to talk to the patient and her family regarding her testing.  Discussed with them that her chest x-ray looks reassuring and that her labs are overall unremarkable with exception of a slight leukocytosis.  They indicate that she was never swabbed for COVID or influenza.   There was a negative result from 0218.  Both family and the patient states that she was never swabbed.  I have asked nursing and per nursing the nursing tech perform the swab.  However, the family is adamant that she has not received a swab.  Will reswab. [CH]    Clinical Course User Index [CH] Freddye Cardamone, Barbette Hair, MD                           Medical Decision Making Amount and/or Complexity of Data Reviewed Labs: ordered. Radiology: ordered.   This  patient presents to the ED for concern of ongoing fever, this involves an extensive number of treatment options, and is a complaint that carries with it a high risk of complications and morbidity.  I considered the following differential and admission for this acute, potentially life threatening condition.  The differential diagnosis includes bacterial infection such as pneumonia, COVID/influenza, urinary tract infection  MDM:    This is a 62 year old female who presents with recurrent fever.  She is overall nontoxic.  She is in no respiratory distress.  She is reporting some discomfort of the right flank.  She has had multiple antibiotics, steroids over the last month.  Reports recent influenza positive.  X-ray shows no evidence of pneumonia or pneumothorax.  COVID and influenza testing are negative.  Lab work notable for leukocytosis to 13.8.  Urinalysis is without obvious UTI.  Unclear etiology of the patient's symptoms although it could be other viral illness.  Given recent multiple rounds of antibiotics, would hesitate to treat presumptively for pneumonia as she is at increased risk for bacterial resistance.  Alternatively, recommend monitoring symptoms closely and if not improving in 2 to 3 days, follow-up with primary physician.  (Labs, imaging, consults)  Labs: I Ordered, and personally interpreted labs.  The pertinent results include: CBC, BMP, COVID and influenza testing, urinalysis  Imaging Studies ordered: I ordered imaging studies  including x-ray I independently visualized and interpreted imaging. I agree with the radiologist interpretation  Additional history obtained from daughter and family at bedside.  External records from outside source obtained and reviewed including recent evaluations  Cardiac Monitoring: The patient was maintained on a cardiac monitor.  I personally viewed and interpreted the cardiac monitored which showed an underlying rhythm of: Sinus rhythm  Reevaluation: After the interventions noted above, I reevaluated the patient and found that they have :stayed the same  Social Determinants of Health:  lives independently  Disposition: Discharge  Co morbidities that complicate the patient evaluation  Past Medical History:  Diagnosis Date   Anxiety    Breast cancer ([place])    Breast cancer ([place])    Family history of breast cancer    Family history of colon cancer    GERD (gastroesophageal reflux disease)    Malignant neoplasm of upper-outer quadrant of left female breast ([place]) 01/31/2016   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   I have reviewed the patients home medicines and have made adjustments as needed  Problem List / ED Course: Problem List Items Addressed This Visit   None Visit Diagnoses     Febrile illness    -  Primary                   Final Clinical Impression(s) / ED Diagnoses Final diagnoses:  Febrile illness    Rx / DC Orders ED Discharge Orders     None         Merryl Hacker, MD 03/08/22 [id number]

## 2022-03-24 ENCOUNTER — Encounter (HOSPITAL_COMMUNITY): Payer: Self-pay | Admitting: Emergency Medicine

## 2022-03-24 ENCOUNTER — Other Ambulatory Visit: Payer: Self-pay

## 2022-03-24 ENCOUNTER — Emergency Department (HOSPITAL_COMMUNITY)
Admission: EM | Admit: 2022-03-24 | Discharge: 2022-03-24 | Disposition: A | Discharge status: Home / Self Care | Payer: BC Managed Care – PPO | Attending: Emergency Medicine | Admitting: Emergency Medicine

## 2022-03-24 ENCOUNTER — Emergency Department (HOSPITAL_COMMUNITY): Payer: BC Managed Care – PPO

## 2022-03-24 DIAGNOSIS — Z853 Personal history of malignant neoplasm of breast: Secondary | ICD-10-CM | POA: Insufficient documentation

## 2022-03-24 DIAGNOSIS — J189 Pneumonia, unspecified organism: Secondary | ICD-10-CM

## 2022-03-24 DIAGNOSIS — R0789 Other chest pain: Secondary | ICD-10-CM | POA: Diagnosis not present

## 2022-03-24 DIAGNOSIS — R079 Chest pain, unspecified: Secondary | ICD-10-CM | POA: Insufficient documentation

## 2022-03-24 DIAGNOSIS — R059 Cough, unspecified: Secondary | ICD-10-CM | POA: Diagnosis not present

## 2022-03-24 DIAGNOSIS — R509 Fever, unspecified: Secondary | ICD-10-CM | POA: Diagnosis not present

## 2022-03-24 DIAGNOSIS — R0602 Shortness of breath: Secondary | ICD-10-CM | POA: Diagnosis not present

## 2022-03-24 DIAGNOSIS — R051 Acute cough: Secondary | ICD-10-CM | POA: Insufficient documentation

## 2022-03-24 LAB — URINALYSIS, ROUTINE W REFLEX MICROSCOPIC
Bilirubin Urine: NEGATIVE
Glucose, UA: NEGATIVE mg/dL
Hgb urine dipstick: NEGATIVE
Ketones, ur: 5 mg/dL — AB
Nitrite: POSITIVE — AB
Protein, ur: NEGATIVE mg/dL
Specific Gravity, Urine: >1.046 — ABNORMAL HIGH (ref 1.005–1.030)
pH: 6 (ref 5.0–8.0)

## 2022-03-24 LAB — BRAIN NATRIURETIC PEPTIDE: B Natriuretic Peptide: 87 pg/mL (ref 0.0–100.0)

## 2022-03-24 LAB — CBC
HCT: 38 % (ref 36.0–46.0)
Hemoglobin: 12.8 g/dL (ref 12.0–15.0)
MCH: 29.8 pg (ref 26.0–34.0)
MCHC: 33.7 g/dL (ref 30.0–36.0)
MCV: 88.4 fL (ref 80.0–100.0)
Platelets: 254 10*3/uL (ref 150–400)
RBC: 4.3 MIL/uL (ref 3.87–5.11)
RDW: 13.7 % (ref 11.5–15.5)
WBC: 9 10*3/uL (ref 4.0–10.5)
nRBC: 0 % (ref 0.0–0.2)

## 2022-03-24 LAB — COMPREHENSIVE METABOLIC PANEL
ALT: 97 U/L — ABNORMAL HIGH (ref 0–44)
AST: 49 U/L — ABNORMAL HIGH (ref 15–41)
Albumin: 3.1 g/dL — ABNORMAL LOW (ref 3.5–5.0)
Alkaline Phosphatase: 284 U/L — ABNORMAL HIGH (ref 38–126)
Anion gap: 10 (ref 5–15)
BUN: 12 mg/dL (ref 6–20)
CO2: 24 mmol/L (ref 22–32)
Calcium: 8.5 mg/dL — ABNORMAL LOW (ref 8.9–10.3)
Chloride: 101 mmol/L (ref 98–111)
Creatinine, Ser: 0.72 mg/dL (ref 0.44–1.00)
GFR, Estimated: >60 mL/min (ref >60)
Glucose, Bld: 159 mg/dL — ABNORMAL HIGH (ref 70–99)
Potassium: 3.5 mmol/L (ref 3.5–5.1)
Sodium: 135 mmol/L (ref 135–145)
Total Bilirubin: 0.8 mg/dL (ref 0.3–1.2)
Total Protein: 6.7 g/dL (ref 6.5–8.1)

## 2022-03-24 LAB — TROPONIN I (HIGH SENSITIVITY)
Troponin I (High Sensitivity): 4 ng/L (ref <18)
Troponin I (High Sensitivity): 4 ng/L (ref <18)

## 2022-03-24 MED ORDER — MORPHINE SULFATE (PF) 4 MG/ML IV SOLN
4.0000 mg | Freq: Once | INTRAVENOUS | Status: AC
  Administered 2022-03-24: 4 mg via INTRAVENOUS
  Filled 2022-03-24: qty 1

## 2022-03-24 MED ORDER — OXYCODONE-ACETAMINOPHEN 5-325 MG PO TABS: 1.0000 | ORAL_TABLET | Freq: Four times a day (QID) | ORAL | 0 refills | Status: AC | PRN

## 2022-03-24 MED ORDER — LEVOFLOXACIN 500 MG PO TABS
500.0000 mg | ORAL_TABLET | Freq: Once | ORAL | Status: AC
  Administered 2022-03-24: 500 mg via ORAL
  Filled 2022-03-24: qty 1

## 2022-03-24 MED ORDER — IOHEXOL 350 MG/ML SOLN
75.0000 mL | Freq: Once | INTRAVENOUS | Status: AC | PRN
  Administered 2022-03-24: 75 mL via INTRAVENOUS

## 2022-03-24 MED ORDER — LEVOFLOXACIN 500 MG PO TABS: 500.0000 mg | ORAL_TABLET | Freq: Every day | ORAL | 0 refills | Status: AC

## 2022-03-24 MED ORDER — SODIUM CHLORIDE 0.9 % IV BOLUS
1000.0000 mL | Freq: Once | INTRAVENOUS | Status: AC
  Administered 2022-03-24: 1000 mL via INTRAVENOUS

## 2022-03-24 MED ORDER — OXYCODONE-ACETAMINOPHEN 5-325 MG PO TABS
1.0000 | ORAL_TABLET | Freq: Once | ORAL | Status: AC
  Administered 2022-03-24: 1 via ORAL
  Filled 2022-03-24: qty 1

## 2022-03-24 NOTE — ED Provider Notes (Addendum)
[facility] Emergency Department Provider Note MRN:  [id number]  Arrival date & time: 03/24/22     Chief Complaint   Fever and Cough   History of Present Illness   Grace Moyer is a 62 y.o. year-old female with a history of VTE, breast cancer, autoimmune disease presenting to the ED with chief complaint of fever and cough.  Fever and cough and chest pain and back pain for the past day or 2.  Had a cough 2 or 3 weeks ago that briefly got better but now is much worse.  Endorsing mild shortness of breath, no abdominal pain.  Review of Systems  A thorough review of systems was obtained and all systems are negative except as noted in the HPI and PMH.   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[place])    Personal history of chemotherapy    Personal history of radiation therapy     Past Surgical History:  Procedure Laterality Date   ABDOMINAL HYSTERECTOMY     partial   BREAST BIOPSY Left 2017   BREAST LUMPECTOMY Left    2018   BREAST LUMPECTOMY WITH RADIOACTIVE SEED AND SENTINEL LYMPH NODE BIOPSY Left 07/31/2016   Procedure: LEFT BREAST RADIOACTIVE SEED GUIDED LUMPECTOMY WITH LEFT RADIOACTIVE SEED TARGETED AXILLARY SENTINEL LYMPH NODE EXCISION AND SENTINEL LYMPH NODE BIOPSY;  Surgeon: Rolm Bookbinder, MD;  Location: [place];  Service: General;  Laterality: Left;   BREAST REDUCTION SURGERY Bilateral 08/05/2016   Procedure: LEFT ONCOPLASTIC REDUCTION; RIGHT BREAST REDUCTION;  Surgeon: Irene Limbo, MD;  Location: [place];  Service: Plastics;  Laterality: Bilateral;   EYE SURGERY     PORT-A-CATH REMOVAL Right 07/31/2016    Procedure: REMOVAL PORT-A-CATH;  Surgeon: Rolm Bookbinder, MD;  Location: [place];  Service: General;  Laterality: Right;   PORTACATH PLACEMENT Right 02/11/2016   Procedure: INSERTION PORT-A-CATH WITH [place];  Surgeon: Rolm Bookbinder, MD;  Location: [place];  Service: General;  Laterality: Right;   REDUCTION MAMMAPLASTY      Family History  Problem Relation Age of Onset   Breast cancer Maternal Grandmother        dx in her 107s   CAD Paternal Grandmother    Colon cancer Paternal Uncle        dx in his 8s   COPD Maternal Grandfather    CAD Paternal Grandfather    Breast cancer Cousin        dx in her early to mid 85s; maternal first cousin   Lung cancer Paternal Uncle     Social History   Socioeconomic History   Marital status: Married    Spouse name: Not on file   Number of children: Not on file   Years of education: Not on file   Highest education level: Not on file  Occupational History   Not on file  Tobacco Use   Smoking status: Former    Packs/day: 0.10    Years: 21.00    Total pack years: 2.10    Types: Cigarettes    Start date: 9    Quit date:  09/03/2001    Years since quitting: 20.5   Smokeless tobacco: Never  Substance and Sexual Activity   Alcohol use: Yes    Comment: social   Drug use: No   Sexual activity: Not on file  Other Topics Concern   Not on file  Social History Narrative   Not on file   Social Determinants of Health   Financial Resource Strain: Not on file  Food Insecurity: Not on file  Transportation Needs: Not on file  Physical Activity: Not on file  Stress: Not on file  Social Connections: Not on file  Intimate Partner Violence: Not on file     Physical Exam   Vitals:   03/24/22 0623 03/24/22 0734  BP: (!) 147/78   Pulse: (!) 108   Resp: (!) 24   Temp: (!) 100.5 F (38.1 C) 99.5 F (37.5 C)  SpO2: 96%     CONSTITUTIONAL: Well-appearing, NAD NEURO/PSYCH:  Alert and oriented x 3, no  focal deficits EYES:  eyes equal and reactive ENT/NECK:  no LAD, no JVD CARDIO: Tachycardic rate, well-perfused, normal S1 and S2 PULM:  CTAB no wheezing or rhonchi GI/GU:  non-distended, non-tender MSK/SPINE:  No gross deformities, no edema SKIN:  no rash, atraumatic   *Additional and/or pertinent findings included in MDM below  Diagnostic and Interventional Summary    EKG Interpretation  Date/Time:  03/24/2022 at 7: 30: 53 Ventricular Rate:  102 PR Interval:  151 QRS Duration:  112 QT Interval:  362  QTC Calculation:  472 R Axis:     Text Interpretation: Sinus tachycardia Confirm with Dr. Gerlene Fee at 7:34 AM       Labs Reviewed  COMPREHENSIVE METABOLIC PANEL - Abnormal; Notable for the following components:      Result Value   Glucose, Bld 159 (*)    Calcium 8.5 (*)    Albumin 3.1 (*)    AST 49 (*)    ALT 97 (*)    Alkaline Phosphatase 284 (*)    All other components within normal limits  CULTURE, BLOOD (ROUTINE X 2)  CULTURE, BLOOD (ROUTINE X 2)  CBC  BRAIN NATRIURETIC PEPTIDE  TROPONIN I (HIGH SENSITIVITY)    DG Chest Port 1 View  Final Result    CT Angio Chest Pulmonary Embolism (PE) W or WO Contrast    (Results Pending)    Medications  iohexol (OMNIPAQUE) 350 MG/ML injection 75 mL (has no administration in time range)  sodium chloride 0.9 % bolus 1,000 mL (1,000 mLs Intravenous New Bag/Given 03/24/22 0705)  morphine (PF) 4 MG/ML injection 4 mg (4 mg Intravenous Given 03/24/22 1696)     Procedures  /  Critical Care Procedures  ED Course and Medical Decision Making  Initial Impression and Ddx Differential diagnosis includes PE given that she has a history of this and she is having pleuritic chest/back pain.  She is not anticoagulated, was taken off after 6 months of therapy in the past.  Other considerations include ACS, pneumonia.  Past medical/surgical history that increases complexity of ED encounter: Autoimmune disease, VTE history, breast  cancer history  Interpretation of Diagnostics I personally reviewed the Chest Xray and my interpretation is as follows: No pneumothorax, possible left pulmonary infiltrate  Labs pending  Patient Reassessment and Ultimate Disposition/Management     Signed out to oncoming provider at shift change.  Patient management required discussion with the following services or consulting groups:  None  Complexity of Problems Addressed Acute illness or injury that  poses threat of life of bodily function  Additional Data Reviewed and Analyzed Further history obtained from: Further history from spouse/family member  Additional Factors Impacting ED Encounter Risk Consideration of hospitalization  Barth Kirks. Sedonia Small, [employer] [email or web address]  Final Clinical Impressions(s) / ED Diagnoses     ICD-10-CM   1. Acute cough  R05.1     2. Chest pain, unspecified type  R07.9       ED Discharge Orders     None        Discharge Instructions Discussed with and Provided to Patient:   Discharge Instructions   None      Maudie Flakes, MD 03/24/22 7207    Maudie Flakes, MD 03/24/22 [phone]

## 2022-03-24 NOTE — Discharge Instructions (Addendum)
Follow-up with your family doctor for recheck in 2 to 3 days.  Your family doctor can decide when you can go back to work after your recheck

## 2022-03-24 NOTE — ED Notes (Signed)
Patient transported to CT 

## 2022-03-24 NOTE — ED Triage Notes (Signed)
Pt with c/o fever x 2 days (T 101 @ home). Last dose of Tylenol was @ 2200. Pt also reports cough x 1 week and pain in upper back between shoulder blades that she reports is from "all the coughing". Pt believes she may also have a UTI because of "burning with urination".

## 2022-03-26 LAB — URINE CULTURE: Culture: >=100000 — AB

## 2022-03-27 ENCOUNTER — Telehealth (HOSPITAL_BASED_OUTPATIENT_CLINIC_OR_DEPARTMENT_OTHER): Payer: Self-pay

## 2022-03-27 ENCOUNTER — Other Ambulatory Visit: Payer: Self-pay | Admitting: Family Medicine

## 2022-03-27 DIAGNOSIS — J329 Chronic sinusitis, unspecified: Secondary | ICD-10-CM | POA: Diagnosis not present

## 2022-03-27 DIAGNOSIS — Z6833 Body mass index (BMI) 33.0-33.9, adult: Secondary | ICD-10-CM | POA: Diagnosis not present

## 2022-03-27 DIAGNOSIS — E6609 Other obesity due to excess calories: Secondary | ICD-10-CM | POA: Diagnosis not present

## 2022-03-27 DIAGNOSIS — J189 Pneumonia, unspecified organism: Secondary | ICD-10-CM | POA: Diagnosis not present

## 2022-03-27 DIAGNOSIS — E119 Type 2 diabetes mellitus without complications: Secondary | ICD-10-CM | POA: Diagnosis not present

## 2022-03-27 NOTE — Telephone Encounter (Signed)
Post ED Visit - Positive Culture Follow-up  Culture report reviewed by antimicrobial stewardship pharmacist: [place] Team '[]'$  Elenor Quinones, Pharm.D. '[]'$  Heide Guile, Pharm.D., BCPS AQ-ID '[]'$  Parks Neptune, Pharm.D., BCPS '[]'$  Alycia Rossetti, Pharm.D., BCPS '[]'$  [place], [place].D., BCPS, AAHIVP '[]'$  Legrand Como, Pharm.D., BCPS, AAHIVP '[]'$  Salome Arnt, PharmD, BCPS '[]'$  Johnnette Gourd, PharmD, BCPS '[x]'$  Luisa Hart, PharmD, BCPS '[]'$  Leeroy Cha, PharmD '[]'$  Laqueta Linden, PharmD, BCPS '[]'$  Albertina Parr, PharmD  [place] Team '[]'$  Leodis Sias, PharmD '[]'$  Lindell Spar, PharmD '[]'$  Royetta Asal, PharmD '[]'$  Graylin Shiver, Rph '[]'$  Rema Fendt) Glennon Mac, PharmD '[]'$  Arlyn Dunning, PharmD '[]'$  Netta Cedars, PharmD '[]'$  Dia Sitter, PharmD '[]'$  Leone Haven, PharmD '[]'$  Gretta Arab, PharmD '[]'$  Theodis Shove, PharmD '[]'$  Peggyann Juba, PharmD '[]'$  Reuel Boom, PharmD   Positive urine culture Treated with Levofloxacin, organism sensitive to the same and no further patient follow-up is required at this time.  Glennon Hamilton 03/27/2022, 2:02 PM

## 2022-03-29 LAB — CULTURE, BLOOD (ROUTINE X 2)
Culture: NO GROWTH
Culture: NO GROWTH

## 2022-04-04 ENCOUNTER — Other Ambulatory Visit: Payer: Self-pay

## 2022-04-04 ENCOUNTER — Inpatient Hospital Stay: Payer: BC Managed Care – PPO | Attending: Hematology

## 2022-04-04 ENCOUNTER — Inpatient Hospital Stay (HOSPITAL_BASED_OUTPATIENT_CLINIC_OR_DEPARTMENT_OTHER): Payer: BC Managed Care – PPO | Admitting: Hematology

## 2022-04-04 ENCOUNTER — Encounter: Payer: Self-pay | Admitting: Hematology

## 2022-04-04 VITALS — BP 115/74 | HR 65 | Temp 98.5°F | Resp 18 | Ht 63.0 in | Wt 194.3 lb

## 2022-04-04 DIAGNOSIS — G360 Neuromyelitis optica [Devic]: Secondary | ICD-10-CM | POA: Diagnosis not present

## 2022-04-04 DIAGNOSIS — Z9221 Personal history of antineoplastic chemotherapy: Secondary | ICD-10-CM | POA: Insufficient documentation

## 2022-04-04 DIAGNOSIS — Z171 Estrogen receptor negative status [ER-]: Secondary | ICD-10-CM

## 2022-04-04 DIAGNOSIS — Z800 Family history of malignant neoplasm of digestive organs: Secondary | ICD-10-CM | POA: Diagnosis not present

## 2022-04-04 DIAGNOSIS — Z853 Personal history of malignant neoplasm of breast: Secondary | ICD-10-CM | POA: Diagnosis not present

## 2022-04-04 DIAGNOSIS — Z79899 Other long term (current) drug therapy: Secondary | ICD-10-CM | POA: Insufficient documentation

## 2022-04-04 DIAGNOSIS — Z7982 Long term (current) use of aspirin: Secondary | ICD-10-CM | POA: Diagnosis not present

## 2022-04-04 DIAGNOSIS — Z803 Family history of malignant neoplasm of breast: Secondary | ICD-10-CM | POA: Insufficient documentation

## 2022-04-04 DIAGNOSIS — Z86711 Personal history of pulmonary embolism: Secondary | ICD-10-CM | POA: Diagnosis not present

## 2022-04-04 DIAGNOSIS — Z86718 Personal history of other venous thrombosis and embolism: Secondary | ICD-10-CM | POA: Insufficient documentation

## 2022-04-04 DIAGNOSIS — C50412 Malignant neoplasm of upper-outer quadrant of left female breast: Secondary | ICD-10-CM | POA: Diagnosis not present

## 2022-04-04 DIAGNOSIS — Z923 Personal history of irradiation: Secondary | ICD-10-CM | POA: Diagnosis not present

## 2022-04-04 LAB — CMP (CANCER CENTER ONLY)
ALT: 16 U/L (ref 0–44)
AST: 10 U/L — ABNORMAL LOW (ref 15–41)
Albumin: 4.1 g/dL (ref 3.5–5.0)
Alkaline Phosphatase: 116 U/L (ref 38–126)
Anion gap: 8 (ref 5–15)
BUN: 19 mg/dL (ref 6–20)
CO2: 27 mmol/L (ref 22–32)
Calcium: 9.5 mg/dL (ref 8.9–10.3)
Chloride: 103 mmol/L (ref 98–111)
Creatinine: 0.86 mg/dL (ref 0.44–1.00)
GFR, Estimated: >60 mL/min (ref >60)
Glucose, Bld: 231 mg/dL — ABNORMAL HIGH (ref 70–99)
Potassium: 4.4 mmol/L (ref 3.5–5.1)
Sodium: 138 mmol/L (ref 135–145)
Total Bilirubin: 0.4 mg/dL (ref 0.3–1.2)
Total Protein: 6.3 g/dL — ABNORMAL LOW (ref 6.5–8.1)

## 2022-04-04 LAB — CBC WITH DIFFERENTIAL (CANCER CENTER ONLY)
Abs Immature Granulocytes: 0.07 10*3/uL (ref 0.00–0.07)
Basophils Absolute: 0 10*3/uL (ref 0.0–0.1)
Basophils Relative: 0 %
Eosinophils Absolute: 0 10*3/uL (ref 0.0–0.5)
Eosinophils Relative: 0 %
HCT: 40.8 % (ref 36.0–46.0)
Hemoglobin: 14 g/dL (ref 12.0–15.0)
Immature Granulocytes: 1 %
Lymphocytes Relative: 7 %
Lymphs Abs: 0.5 10*3/uL — ABNORMAL LOW (ref 0.7–4.0)
MCH: 30.1 pg (ref 26.0–34.0)
MCHC: 34.3 g/dL (ref 30.0–36.0)
MCV: 87.7 fL (ref 80.0–100.0)
Monocytes Absolute: 0.1 10*3/uL (ref 0.1–1.0)
Monocytes Relative: 2 %
Neutro Abs: 6.9 10*3/uL (ref 1.7–7.7)
Neutrophils Relative %: 90 %
Platelet Count: 230 10*3/uL (ref 150–400)
RBC: 4.65 MIL/uL (ref 3.87–5.11)
RDW: 14.3 % (ref 11.5–15.5)
WBC Count: 7.6 10*3/uL (ref 4.0–10.5)
nRBC: 0 % (ref 0.0–0.2)

## 2022-04-04 NOTE — Progress Notes (Signed)
[place]   [phone] Fax:([phone]) [phone]   Clinic Follow up Note   Patient Care Team: Sharilyn Sites, MD as PCP - General (Family Medicine) Collene Gobble, MD as Attending Physician (Psychiatry) Rolm Bookbinder, MD as Consulting Physician (General Surgery) Truitt Merle, MD as Consulting Physician (Hematology) Kyung Rudd, MD as Consulting Physician (Radiation Oncology) Gardenia Phlegm, NP as Nurse Practitioner (Hematology and Oncology)  Date of Service:  04/04/2022  CHIEF COMPLAINT: f/u of eft breast cancer, triple negative   CURRENT THERAPY:  Surveillance   ASSESSMENT:  Grace Moyer is a 62 y.o. female with   Malignant neoplasm of upper-outer quadrant of left breast in female, estrogen receptor negative ([place]) invasive ductal carcinoma, G3, cT2N1M0, stage IIB, ER-/PR-/HER2-, ypT0N0 -She was diagnosed in 01/2016. She is s/p neo-adjuvant ddAC and CT, left breast lumpectomy and reconstruction, adjuvant radiation.  -Her 05/2019 DEXA shows normal Bone density with lowest T-score -0.7.  -From a breast cancer standpoint, She is clinically doing well. Lab reviewed, her counts have recovered from her Covid infection. Physical exam benign. Her 09/26/20 Mammogram was normal.  She will return to screening mammograms starting next year. -She continues annual f/u with her GYN. -she is 6 years out, her risk of recurrence is minimal now, I will see her as needed in future.     PLAN: - lab reviewed  -encourage the pt to get annual Mammograms and f/u with her PCP yearly.  SUMMARY OF ONCOLOGIC HISTORY: Oncology History Overview Note  Cancer Staging Malignant neoplasm of upper-outer quadrant of left female breast [facility]) Staging form: Breast, AJCC 7th Edition - Clinical stage from 01/29/2016: Stage IIB (T2, N1, M0) - Signed by Truitt Merle, MD on 02/06/2016 - Pathologic stage from 07/31/2016: T0, N0, cM0 - Signed by Truitt Merle, MD on 12/31/2016      Malignant neoplasm of upper-outer quadrant of left breast in female, estrogen receptor negative ([place])  01/28/2016 Mammogram   Diagnostic MM and US showed a 2.9cm (3.2X2.2X2.5cm on [place]) mass in UOQ, multiple enlarged left axillary nodes, largest 2.5cm.    01/29/2016 Initial Diagnosis   Malignant neoplasm of upper-outer quadrant of left female breast ([place])   01/29/2016 Initial Biopsy   Left breast mass and axillary node biopsy showed IDC, G3,    01/29/2016 Receptors her2   ER-, PR-, HER2-, Ki67 85%   02/11/2016 Imaging   Bilateral breast MRI with and without contrast showed a 3.3 x 2.6 x 2.6 cm mass in the upper outer left breast, and left axillary lymphadenopathy, at least 3 abnormal lymph nodes, no other additional sites of concern.   02/12/2016 Imaging   CT chest, abdomen and pelvis with contrast showed a 2.4 x 2.7 cm lesion in the upper outer left breast, left axillary node metastasis measuring up to 1.3 cm, no evidence of distant metastasis.   02/12/2016 Imaging   Bone scan was negative for skeletal metastasis.   02/13/2016 - 07/03/2016 Neo-Adjuvant Chemotherapy   Dose dense Adriamycin 60 mg/m, Cytoxan 600 mg/m, every 2 weeks, for 4 cycles, followed by weekly carboplatin and Taxol for 12 weeks    03/27/2016 Genetic Testing   Patient has genetic testing done for personal history of breast cancer, family history of cancer. ATM [id number] VUS identified on the Breast/GYN panel.  Negative genetic testing for the MSH2 inversion analysis ([place] inversion). The Breast/GYN gene panel offered by [facility] includes sequencing and rearrangement analysis for the following 23 genes:  ATM, BARD1, BRCA1, BRCA2, BRIP1, CDH1, CHEK2,  EPCAM, FANCC, MLH1, MSH2, MSH6, MUTYH, NBN, NF1, PALB2, PMS2, POLD1, PTEN, RAD51C, RAD51D, RECQL, and TP53.      07/11/2016 Imaging   Bilateral Breast MRI FINDINGS: Breast composition: c. Heterogeneous fibroglandular tissue.   Background parenchymal enhancement:  Minimal.   Right breast: No mass or abnormal enhancement.   Left breast: No mass or abnormal enhancement. Biopsy clip is identified at the posterior left breast upper outer quadrant. The previously noted enhancing mass is not seen currently.   Lymph nodes: No abnormal appearing lymph nodes. Previously noted abnormal lymph nodes in the left axilla are currently normal size.   Ancillary findings:  None.   IMPRESSION: Known cancer.   07/31/2016 Pathology Results   Diagnosis 1. Breast, lumpectomy, Left - LOBULAR NEOPLASIA (ATYPICAL LOBULAR HYPERPLASIA). - FIBROCYSTIC CHANGES WITH ADENOSIS AND CALCIFICATIONS. - [place]. - SEE ONCOLOGY TABLE BELOW. 2. Breast, excision, Left additional Medial Margin - LOBULAR NEOPLASIA (ATYPICAL LOBULAR HYPERPLASIA). - FIBROCYSTIC CHANGES WITH ADENOSIS AND CALCIFICATIONS. - RADIAL SCAR. - HEALING BIOPSY SITE. - SEE COMMENT. 3. Lymph node, sentinel, biopsy, Left - THERE IS NO EVIDENCE OF CARCINOMA IN 1 OF 1 LYMPH NODE (0/1) - CHANGES CONSISTENT WITH PRIOR PROCEDURE. 4. Lymph node, sentinel, biopsy, Left - THERE IS NO EVIDENCE OF CARCINOMA IN 1 OF 1 LYMPH NODE (0/1) 5. Lymph node, sentinel, biopsy, Left - THERE IS NO EVIDENCE OF CARCINOMA IN 1 OF 1 LYMPH NODE (0/1) 6. Lymph node, sentinel, biopsy, Left - THERE IS NO EVIDENCE OF CARCINOMA IN 1 OF 1 LYMPH NODE (0/1) 7. Lymph node, sentinel, biopsy, Left - THERE IS NO EVIDENCE OF CARCINOMA IN 1 OF 1 LYMPH NODE (0/1)   07/31/2016 Surgery   LEFT BREAST RADIOACTIVE SEED GUIDED LUMPECTOMY WITH LEFT RADIOACTIVE SEED TARGETED AXILLARY SENTINEL LYMPH NODE EXCISION AND SENTINEL LYMPH NODE BIOPSY and port removal done by Dr. Donne Hazel.     08/05/2016 Pathology Results   Surgical pathology  Diagnosis 1. Breast, Mammoplasty, Left - SCLEROSING ADENOSIS WITH CALCIFICATIONS - FIBROCYSTIC CHANGES - DUCT ECTASIA - NO MALIGNANCY IDENTIFIED 2. Breast, Mammoplasty, Right - SCLEROSING ADENOSIS  WITH CALCIFICATIONS - FIBROCYSTIC CHANGES - DUCT ECTASIA - NO MALIGNANCY IDENTIFIED   08/05/2016 Surgery   LEFT ONCOPLASTIC REDUCTION; RIGHT BREAST REDUCTION by Dr. Iran Planas    09/11/2016 - 10/27/2016 Radiation Therapy   Radiation with Dr. Lisbeth Renshaw  Radiation treatment dates:   09/11/2016 to 10/27/2016   Site/dose:    1. The Left breast was treated to 50.4 Gy in 28 fractions at 1.8 Gy per fraction. 2. The Left Sclav was treated to 50.4 Gy in 28 fractions at 1.8 Gy per fraction. 3. The Left breast was boosted to 10 Gy in 5 fractions at 2 Gy per fraction.    Beams/energy:    1. 3D // 10X, 15X 2. photon // 15X, 6X   Narrative: The patient tolerated radiation treatment relatively well.  She developed mild erythema within the treatment field without any moist desquamation. Using Radiaplex as directed and using neosporin under inframammary fold where skin has peeled.    01/19/2017 Mammogram   IMPRESSION: No mammographic evidence of malignancy, status post left lumpectomy and bilateral reduction mammoplasty.      INTERVAL HISTORY:  Grace Moyer is here for a follow up of eft breast cancer, triple negative  She was last seen by me on 10/05/2020 She presents to the clinic alone. Pt reports she had pneumonia last week. But she feels a lot better. Pt did f/u  with her PCP.  Pt states her neuropathy is still there. Pt did have fever every day and cough, which has near resolved with antibiotics.     All other systems were reviewed with the patient and are negative.  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[place])    Personal history of chemotherapy    Personal history of radiation therapy      SURGICAL HISTORY: Past Surgical History:  Procedure Laterality Date   ABDOMINAL HYSTERECTOMY     partial   BREAST BIOPSY Left 2017   BREAST LUMPECTOMY Left    2018   BREAST LUMPECTOMY WITH RADIOACTIVE SEED AND SENTINEL LYMPH NODE BIOPSY Left 07/31/2016   Procedure: LEFT BREAST RADIOACTIVE SEED GUIDED LUMPECTOMY WITH LEFT RADIOACTIVE SEED TARGETED AXILLARY SENTINEL LYMPH NODE EXCISION AND SENTINEL LYMPH NODE BIOPSY;  Surgeon: Rolm Bookbinder, MD;  Location: [place];  Service: General;  Laterality: Left;   BREAST REDUCTION SURGERY Bilateral 08/05/2016   Procedure: LEFT ONCOPLASTIC REDUCTION; RIGHT BREAST REDUCTION;  Surgeon: Irene Limbo, MD;  Location: [place];  Service: Plastics;  Laterality: Bilateral;   EYE SURGERY     PORT-A-CATH REMOVAL Right 07/31/2016   Procedure: REMOVAL PORT-A-CATH;  Surgeon: Rolm Bookbinder, MD;  Location: [place];  Service: General;  Laterality: Right;   PORTACATH PLACEMENT Right 02/11/2016   Procedure: INSERTION PORT-A-CATH WITH [place];  Surgeon: Rolm Bookbinder, MD;  Location: [place];  Service: General;  Laterality: Right;   REDUCTION MAMMAPLASTY      I have reviewed the social history and family history with the patient and they are unchanged from previous note.  ALLERGIES:  is allergic to hydrocodone.  MEDICATIONS:  Current Outpatient Medications  Medication Sig Dispense Refill   acetaminophen (TYLENOL) 500 MG tablet Take by mouth.     albuterol (VENTOLIN HFA) 108 (90 Base) MCG/ACT inhaler Inhale 2 puffs into the lungs every 4 (four) hours as needed for wheezing or shortness of breath. (Patient not taking: Reported on 12/31/2021) 6.7 g 3   aspirin 81 MG tablet Take 1 tablet (81 mg total) by mouth daily. 30 tablet    Cholecalciferol 25 MCG (1000 UT) tablet Take 5,000 Units by mouth daily.  (Patient not taking: Reported on 01/17/2022)     diclofenac Sodium (VOLTAREN) 1 % GEL  SMARTSIG:2 Gram(s) Topical 3 Times Daily PRN (Patient not taking: Reported on 01/17/2022)     gabapentin (NEURONTIN) 300 MG capsule Take 300 mg by mouth at bedtime.     levofloxacin (LEVAQUIN) 500 MG tablet Take 1 tablet (500 mg total) by mouth daily. 10 tablet 0   oxyCODONE-acetaminophen (PERCOCET/ROXICET) 5-325 MG tablet Take 1 tablet by mouth every 6 (six) hours as needed for severe pain. 20 tablet 0   tiZANidine (ZANAFLEX) 2 MG tablet Take by mouth.     vitamin C (VITAMIN C) 500 MG tablet Take 1 tablet (500 mg total) by mouth daily. (Patient not taking: Reported on 01/17/2022) 30 tablet 0   zinc sulfate 220 (50 Zn) MG capsule Take 1 capsule (220 mg total) by mouth daily. (Patient not taking: Reported on 01/17/2022) 30 capsule 0   No current facility-administered medications for  this visit.   Facility-Administered Medications Ordered in Other Visits  Medication Dose Route Frequency Provider Last Rate Last Admin   heparin lock flush 100 unit/mL  500 Units Intravenous Once PRN Truitt Merle, MD       sodium chloride flush (NS) 0.9 % injection 10 mL  10 mL Intracatheter PRN Truitt Merle, MD   10 mL at 06/18/16 1540   sodium chloride flush (NS) 0.9 % injection 10 mL  10 mL Intravenous PRN Truitt Merle, MD       sodium chloride flush (NS) 0.9 % injection 10 mL  10 mL Intravenous PRN Truitt Merle, MD   10 mL at 06/25/16 1106    PHYSICAL EXAMINATION: ECOG PERFORMANCE STATUS: 1 - Symptomatic but completely ambulatory  Vitals:   04/04/22 1426  BP: 115/74  Pulse: 65  Resp: 18  Temp: 98.5 F (36.9 C)  SpO2: 100%   Wt Readings from Last 3 Encounters:  04/04/22 194 lb 4.8 oz (88.1 kg)  03/24/22 198 lb 6.6 oz (90 kg)  03/08/22 198 lb (89.8 kg)     NECK:(-)  supple, thyroid normal size, non-tender, without nodularity LYMPH: (-)  no palpable lymphadenopathy in the cervical, axillary  LUNGS:(-)  clear to auscultation and percussion with normal breathing effort HEART: (-) regular rate & rhythm and no  murmurs and no lower extremity edema ABDOMEN:(-) abdomen soft, non-tender and normal bowel sounds Musculoskeletal:no cyanosis of digits and no clubbing  NEURO: alert & oriented x 3 with fluent speech, no focal motor/sensory deficits BREAST: Left breast lumpectomy little scar tissue. No palpable mass , breast exam benign.      LABORATORY DATA:  I have reviewed the data as listed    Latest Ref Rng & Units 04/04/2022    1:44 PM 03/24/2022    6:33 AM 03/08/2022    2:38 AM  CBC  WBC 4.0 - 10.5 K/uL 7.6  9.0  13.8   Hemoglobin 12.0 - 15.0 g/dL 14.0  12.8  13.6   Hematocrit 36.0 - 46.0 % 40.8  38.0  40.5   Platelets 150 - 400 K/uL 230  254  157         Latest Ref Rng & Units 04/04/2022    1:44 PM 03/24/2022    6:33 AM 03/08/2022    2:38 AM  CMP  Glucose 70 - 99 mg/dL 231  159  142   BUN 6 - 20 mg/dL '19  12  8   '$ Creatinine 0.44 - 1.00 mg/dL 0.86  0.72  0.72   Sodium 135 - 145 mmol/L 138  135  135   Potassium 3.5 - 5.1 mmol/L 4.4  3.5  3.6   Chloride 98 - 111 mmol/L 103  101  103   CO2 22 - 32 mmol/L '27  24  21   '$ Calcium 8.9 - 10.3 mg/dL 9.5  8.5  8.4   Total Protein 6.5 - 8.1 g/dL 6.3  6.7    Total Bilirubin 0.3 - 1.2 mg/dL 0.4  0.8    Alkaline Phos 38 - 126 U/L 116  284    AST 15 - 41 U/L 10  49    ALT 0 - 44 U/L 16  97        RADIOGRAPHIC STUDIES: I have personally reviewed the radiological images as listed and agreed with the findings in the report. No results found.    No orders of the defined types were placed in this encounter.  All questions were answered. The  patient knows to call the clinic with any problems, questions or concerns. No barriers to learning was detected. The total time spent in the appointment was 20 minutes.     Truitt Merle, MD 04/04/2022   Felicity Coyer, CMA, am acting as scribe for Truitt Merle, MD.   I have reviewed the above documentation for accuracy and completeness, and I agree with the above.

## 2022-04-04 NOTE — Assessment & Plan Note (Signed)
invasive ductal carcinoma, G3, cT2N1M0, stage IIB, ER-/PR-/HER2-, ypT0N0 -She was diagnosed in 01/2016. She is s/p neo-adjuvant ddAC and CT, left breast lumpectomy and reconstruction, adjuvant radiation.  -Her 05/2019 DEXA shows normal Bone density with lowest T-score -0.7.  -From a breast cancer standpoint, She is clinically doing well. Lab reviewed, her counts have recovered from her Covid infection. Physical exam benign. Her 09/26/20 Mammogram was normal.  She will return to screening mammograms starting next year. -She continues annual f/u with her GYN. -she is 6 years out, her risk of recurrence is minimal now, I will see her as needed in future.

## 2022-04-23 ENCOUNTER — Encounter: Payer: Self-pay | Admitting: Family Medicine

## 2022-04-25 ENCOUNTER — Ambulatory Visit
Admission: RE | Admit: 2022-04-25 | Discharge: 2022-04-25 | Disposition: A | Discharge status: Home / Self Care | Payer: BC Managed Care – PPO | Source: Ambulatory Visit | Attending: Family Medicine | Admitting: Family Medicine

## 2022-04-25 DIAGNOSIS — J189 Pneumonia, unspecified organism: Secondary | ICD-10-CM

## 2022-05-25 DIAGNOSIS — N390 Urinary tract infection, site not specified: Secondary | ICD-10-CM | POA: Diagnosis not present

## 2022-05-25 DIAGNOSIS — Z6835 Body mass index (BMI) 35.0-35.9, adult: Secondary | ICD-10-CM | POA: Diagnosis not present

## 2022-05-25 DIAGNOSIS — E669 Obesity, unspecified: Secondary | ICD-10-CM | POA: Diagnosis not present

## 2022-06-27 DIAGNOSIS — G360 Neuromyelitis optica [Devic]: Secondary | ICD-10-CM | POA: Diagnosis not present

## 2022-09-18 DIAGNOSIS — G360 Neuromyelitis optica [Devic]: Secondary | ICD-10-CM | POA: Diagnosis not present

## 2022-10-07 DIAGNOSIS — L723 Sebaceous cyst: Secondary | ICD-10-CM | POA: Diagnosis not present

## 2022-11-19 DIAGNOSIS — Z01419 Encounter for gynecological examination (general) (routine) without abnormal findings: Secondary | ICD-10-CM | POA: Diagnosis not present

## 2022-11-19 DIAGNOSIS — Z1272 Encounter for screening for malignant neoplasm of vagina: Secondary | ICD-10-CM | POA: Diagnosis not present

## 2022-11-19 DIAGNOSIS — Z1231 Encounter for screening mammogram for malignant neoplasm of breast: Secondary | ICD-10-CM | POA: Diagnosis not present

## 2022-11-19 DIAGNOSIS — R8279 Other abnormal findings on microbiological examination of urine: Secondary | ICD-10-CM | POA: Diagnosis not present

## 2022-11-19 DIAGNOSIS — Z6836 Body mass index (BMI) 36.0-36.9, adult: Secondary | ICD-10-CM | POA: Diagnosis not present

## 2022-12-26 DIAGNOSIS — G360 Neuromyelitis optica [Devic]: Secondary | ICD-10-CM | POA: Diagnosis not present

## 2023-01-23 DIAGNOSIS — Z1331 Encounter for screening for depression: Secondary | ICD-10-CM | POA: Diagnosis not present

## 2023-01-23 DIAGNOSIS — I2699 Other pulmonary embolism without acute cor pulmonale: Secondary | ICD-10-CM | POA: Diagnosis not present

## 2023-01-23 DIAGNOSIS — G360 Neuromyelitis optica [Devic]: Secondary | ICD-10-CM | POA: Diagnosis not present

## 2023-01-23 DIAGNOSIS — J849 Interstitial pulmonary disease, unspecified: Secondary | ICD-10-CM | POA: Diagnosis not present

## 2023-01-23 DIAGNOSIS — E119 Type 2 diabetes mellitus without complications: Secondary | ICD-10-CM | POA: Diagnosis not present

## 2023-01-23 DIAGNOSIS — E782 Mixed hyperlipidemia: Secondary | ICD-10-CM | POA: Diagnosis not present

## 2023-01-23 DIAGNOSIS — Z0000 Encounter for general adult medical examination without abnormal findings: Secondary | ICD-10-CM | POA: Diagnosis not present

## 2023-01-23 DIAGNOSIS — R7309 Other abnormal glucose: Secondary | ICD-10-CM | POA: Diagnosis not present

## 2023-01-23 DIAGNOSIS — E6609 Other obesity due to excess calories: Secondary | ICD-10-CM | POA: Diagnosis not present

## 2023-01-23 DIAGNOSIS — I82409 Acute embolism and thrombosis of unspecified deep veins of unspecified lower extremity: Secondary | ICD-10-CM | POA: Diagnosis not present

## 2023-01-23 DIAGNOSIS — Z6836 Body mass index (BMI) 36.0-36.9, adult: Secondary | ICD-10-CM | POA: Diagnosis not present

## 2023-01-23 DIAGNOSIS — C50919 Malignant neoplasm of unspecified site of unspecified female breast: Secondary | ICD-10-CM | POA: Diagnosis not present

## 2023-03-11 DIAGNOSIS — M25532 Pain in left wrist: Secondary | ICD-10-CM | POA: Diagnosis not present

## 2023-03-24 DIAGNOSIS — R252 Cramp and spasm: Secondary | ICD-10-CM | POA: Diagnosis not present

## 2023-03-24 DIAGNOSIS — G360 Neuromyelitis optica [Devic]: Secondary | ICD-10-CM | POA: Diagnosis not present

## 2023-03-31 DIAGNOSIS — M25532 Pain in left wrist: Secondary | ICD-10-CM | POA: Diagnosis not present

## 2023-06-26 DIAGNOSIS — G360 Neuromyelitis optica [Devic]: Secondary | ICD-10-CM | POA: Diagnosis not present

## 2023-07-13 DIAGNOSIS — Z13820 Encounter for screening for osteoporosis: Secondary | ICD-10-CM | POA: Diagnosis not present

## 2023-08-11 DIAGNOSIS — G360 Neuromyelitis optica [Devic]: Secondary | ICD-10-CM | POA: Diagnosis not present

## 2023-08-11 DIAGNOSIS — Z79899 Other long term (current) drug therapy: Secondary | ICD-10-CM | POA: Diagnosis not present

## 2023-08-14 DIAGNOSIS — Z6837 Body mass index (BMI) 37.0-37.9, adult: Secondary | ICD-10-CM | POA: Diagnosis not present

## 2023-08-14 DIAGNOSIS — E669 Obesity, unspecified: Secondary | ICD-10-CM | POA: Diagnosis not present

## 2023-08-14 DIAGNOSIS — R030 Elevated blood-pressure reading, without diagnosis of hypertension: Secondary | ICD-10-CM | POA: Diagnosis not present

## 2023-08-14 DIAGNOSIS — J019 Acute sinusitis, unspecified: Secondary | ICD-10-CM | POA: Diagnosis not present

## 2023-10-07 DIAGNOSIS — D485 Neoplasm of uncertain behavior of skin: Secondary | ICD-10-CM | POA: Diagnosis not present

## 2023-10-22 DIAGNOSIS — A6920 Lyme disease, unspecified: Secondary | ICD-10-CM | POA: Diagnosis not present

## 2023-11-20 DIAGNOSIS — Z01419 Encounter for gynecological examination (general) (routine) without abnormal findings: Secondary | ICD-10-CM | POA: Diagnosis not present

## 2023-11-20 DIAGNOSIS — Z6836 Body mass index (BMI) 36.0-36.9, adult: Secondary | ICD-10-CM | POA: Diagnosis not present

## 2023-12-25 DIAGNOSIS — G360 Neuromyelitis optica [Devic]: Secondary | ICD-10-CM | POA: Diagnosis not present

## 2024-01-04 DIAGNOSIS — Z1231 Encounter for screening mammogram for malignant neoplasm of breast: Secondary | ICD-10-CM | POA: Diagnosis not present

## 2024-01-25 DIAGNOSIS — G629 Polyneuropathy, unspecified: Secondary | ICD-10-CM | POA: Diagnosis not present

## 2024-01-25 DIAGNOSIS — M5416 Radiculopathy, lumbar region: Secondary | ICD-10-CM | POA: Diagnosis not present

## 2024-01-25 DIAGNOSIS — Z6836 Body mass index (BMI) 36.0-36.9, adult: Secondary | ICD-10-CM | POA: Diagnosis not present

## 2024-01-25 DIAGNOSIS — Z23 Encounter for immunization: Secondary | ICD-10-CM | POA: Diagnosis not present

## 2024-01-25 DIAGNOSIS — G360 Neuromyelitis optica [Devic]: Secondary | ICD-10-CM | POA: Diagnosis not present
